# Patient Record
Sex: Female | Born: 1945 | Race: Black or African American | Hispanic: No | Smoking: Never smoker
Health system: Southern US, Community
[De-identification: ages and names within clinical notes are randomized; demographics above are authoritative.]

## PROBLEM LIST (undated history)

## (undated) ENCOUNTER — Emergency Department (HOSPITAL_COMMUNITY): Admission: EM | Payer: PRIVATE HEALTH INSURANCE | Source: Home / Self Care

## (undated) DIAGNOSIS — J3489 Other specified disorders of nose and nasal sinuses: Secondary | ICD-10-CM

## (undated) DIAGNOSIS — I10 Essential (primary) hypertension: Secondary | ICD-10-CM

## (undated) DIAGNOSIS — R7301 Impaired fasting glucose: Secondary | ICD-10-CM

## (undated) DIAGNOSIS — R351 Nocturia: Secondary | ICD-10-CM

## (undated) DIAGNOSIS — Z853 Personal history of malignant neoplasm of breast: Secondary | ICD-10-CM

## (undated) DIAGNOSIS — R011 Cardiac murmur, unspecified: Secondary | ICD-10-CM

## (undated) DIAGNOSIS — Z8669 Personal history of other diseases of the nervous system and sense organs: Secondary | ICD-10-CM

## (undated) DIAGNOSIS — J309 Allergic rhinitis, unspecified: Secondary | ICD-10-CM

## (undated) DIAGNOSIS — K219 Gastro-esophageal reflux disease without esophagitis: Secondary | ICD-10-CM

## (undated) DIAGNOSIS — I7121 Aneurysm of the ascending aorta, without rupture: Secondary | ICD-10-CM

## (undated) DIAGNOSIS — Z800 Family history of malignant neoplasm of digestive organs: Secondary | ICD-10-CM

## (undated) DIAGNOSIS — Z803 Family history of malignant neoplasm of breast: Secondary | ICD-10-CM

## (undated) DIAGNOSIS — E785 Hyperlipidemia, unspecified: Secondary | ICD-10-CM

## (undated) DIAGNOSIS — Z8042 Family history of malignant neoplasm of prostate: Secondary | ICD-10-CM

## (undated) DIAGNOSIS — Z8673 Personal history of transient ischemic attack (TIA), and cerebral infarction without residual deficits: Secondary | ICD-10-CM

## (undated) DIAGNOSIS — N8110 Cystocele, unspecified: Secondary | ICD-10-CM

## (undated) DIAGNOSIS — Z801 Family history of malignant neoplasm of trachea, bronchus and lung: Secondary | ICD-10-CM

## (undated) DIAGNOSIS — M5030 Other cervical disc degeneration, unspecified cervical region: Secondary | ICD-10-CM

## (undated) DIAGNOSIS — N3946 Mixed incontinence: Secondary | ICD-10-CM

## (undated) DIAGNOSIS — C801 Malignant (primary) neoplasm, unspecified: Secondary | ICD-10-CM

## (undated) DIAGNOSIS — M1990 Unspecified osteoarthritis, unspecified site: Secondary | ICD-10-CM

## (undated) DIAGNOSIS — I712 Thoracic aortic aneurysm, without rupture: Secondary | ICD-10-CM

## (undated) HISTORY — DX: Hyperlipidemia, unspecified: E78.5

## (undated) HISTORY — PX: JOINT REPLACEMENT: SHX530

## (undated) HISTORY — DX: Essential (primary) hypertension: I10

## (undated) HISTORY — DX: Family history of malignant neoplasm of trachea, bronchus and lung: Z80.1

## (undated) HISTORY — PX: OTHER SURGICAL HISTORY: SHX169

## (undated) HISTORY — PX: TRANSTHORACIC ECHOCARDIOGRAM: SHX275

## (undated) HISTORY — PX: CERVICAL DISC SURGERY: SHX588

## (undated) HISTORY — PX: CARPAL TUNNEL RELEASE: SHX101

## (undated) HISTORY — DX: Family history of malignant neoplasm of prostate: Z80.42

## (undated) HISTORY — PX: CARDIAC CATHETERIZATION: SHX172

## (undated) HISTORY — PX: ABDOMINAL HYSTERECTOMY: SHX81

## (undated) HISTORY — DX: Family history of malignant neoplasm of digestive organs: Z80.0

## (undated) HISTORY — DX: Family history of malignant neoplasm of breast: Z80.3

---

## 1979-04-26 HISTORY — PX: OTHER SURGICAL HISTORY: SHX169

## 1988-08-25 HISTORY — PX: TOTAL ABDOMINAL HYSTERECTOMY W/ BILATERAL SALPINGOOPHORECTOMY: SHX83

## 2001-04-19 ENCOUNTER — Encounter: Payer: Self-pay | Admitting: Internal Medicine

## 2001-04-19 ENCOUNTER — Ambulatory Visit (HOSPITAL_COMMUNITY): Admission: RE | Admit: 2001-04-19 | Discharge: 2001-04-19 | Payer: Self-pay | Admitting: Internal Medicine

## 2002-03-28 ENCOUNTER — Encounter: Admission: RE | Admit: 2002-03-28 | Discharge: 2002-03-28 | Payer: Self-pay | Admitting: Internal Medicine

## 2002-03-28 ENCOUNTER — Encounter: Payer: Self-pay | Admitting: Internal Medicine

## 2002-08-01 ENCOUNTER — Encounter: Payer: Self-pay | Admitting: Internal Medicine

## 2002-08-01 ENCOUNTER — Ambulatory Visit (HOSPITAL_COMMUNITY): Admission: RE | Admit: 2002-08-01 | Discharge: 2002-08-01 | Payer: Self-pay | Admitting: Internal Medicine

## 2002-08-10 ENCOUNTER — Ambulatory Visit (HOSPITAL_COMMUNITY): Admission: RE | Admit: 2002-08-10 | Discharge: 2002-08-10 | Payer: Self-pay | Admitting: Internal Medicine

## 2002-08-10 ENCOUNTER — Encounter: Payer: Self-pay | Admitting: Internal Medicine

## 2002-08-16 ENCOUNTER — Encounter: Payer: Self-pay | Admitting: Internal Medicine

## 2002-08-16 ENCOUNTER — Ambulatory Visit (HOSPITAL_COMMUNITY): Admission: RE | Admit: 2002-08-16 | Discharge: 2002-08-16 | Payer: Self-pay | Admitting: Internal Medicine

## 2003-08-07 ENCOUNTER — Ambulatory Visit (HOSPITAL_COMMUNITY): Admission: RE | Admit: 2003-08-07 | Discharge: 2003-08-07 | Payer: Self-pay | Admitting: Internal Medicine

## 2004-08-25 DIAGNOSIS — C50919 Malignant neoplasm of unspecified site of unspecified female breast: Secondary | ICD-10-CM

## 2004-08-25 HISTORY — DX: Malignant neoplasm of unspecified site of unspecified female breast: C50.919

## 2004-09-17 ENCOUNTER — Ambulatory Visit (HOSPITAL_COMMUNITY): Admission: RE | Admit: 2004-09-17 | Discharge: 2004-09-17 | Payer: Self-pay | Admitting: Internal Medicine

## 2004-09-24 ENCOUNTER — Encounter (INDEPENDENT_AMBULATORY_CARE_PROVIDER_SITE_OTHER): Payer: Self-pay | Admitting: Diagnostic Radiology

## 2004-09-24 ENCOUNTER — Encounter: Admission: RE | Admit: 2004-09-24 | Discharge: 2004-09-24 | Payer: Self-pay | Admitting: Internal Medicine

## 2004-09-24 ENCOUNTER — Encounter (INDEPENDENT_AMBULATORY_CARE_PROVIDER_SITE_OTHER): Payer: Self-pay | Admitting: *Deleted

## 2004-11-08 ENCOUNTER — Encounter: Admission: RE | Admit: 2004-11-08 | Discharge: 2004-11-08 | Payer: Self-pay | Admitting: General Surgery

## 2004-11-11 ENCOUNTER — Ambulatory Visit (HOSPITAL_COMMUNITY): Admission: RE | Admit: 2004-11-11 | Discharge: 2004-11-11 | Payer: Self-pay | Admitting: General Surgery

## 2004-11-11 ENCOUNTER — Ambulatory Visit (HOSPITAL_BASED_OUTPATIENT_CLINIC_OR_DEPARTMENT_OTHER): Admission: RE | Admit: 2004-11-11 | Discharge: 2004-11-11 | Payer: Self-pay | Admitting: General Surgery

## 2004-11-11 ENCOUNTER — Encounter (INDEPENDENT_AMBULATORY_CARE_PROVIDER_SITE_OTHER): Payer: Self-pay | Admitting: Specialist

## 2004-11-11 ENCOUNTER — Encounter: Admission: RE | Admit: 2004-11-11 | Discharge: 2004-11-11 | Payer: Self-pay | Admitting: General Surgery

## 2004-11-18 ENCOUNTER — Ambulatory Visit: Admission: RE | Admit: 2004-11-18 | Discharge: 2005-02-16 | Payer: Self-pay | Admitting: Radiation Oncology

## 2005-01-17 ENCOUNTER — Ambulatory Visit (HOSPITAL_COMMUNITY): Admission: RE | Admit: 2005-01-17 | Discharge: 2005-01-17 | Payer: Self-pay | Admitting: Internal Medicine

## 2005-05-15 ENCOUNTER — Ambulatory Visit (HOSPITAL_COMMUNITY): Admission: RE | Admit: 2005-05-15 | Discharge: 2005-05-15 | Payer: Self-pay | Admitting: Internal Medicine

## 2005-08-25 HISTORY — PX: NECK SURGERY: SHX720

## 2005-10-01 ENCOUNTER — Ambulatory Visit (HOSPITAL_COMMUNITY): Admission: RE | Admit: 2005-10-01 | Discharge: 2005-10-01 | Payer: Self-pay | Admitting: General Surgery

## 2005-11-12 ENCOUNTER — Ambulatory Visit (HOSPITAL_COMMUNITY): Admission: RE | Admit: 2005-11-12 | Discharge: 2005-11-12 | Payer: Self-pay | Admitting: Internal Medicine

## 2005-12-12 ENCOUNTER — Ambulatory Visit (HOSPITAL_COMMUNITY): Admission: RE | Admit: 2005-12-12 | Discharge: 2005-12-13 | Payer: Self-pay | Admitting: Neurosurgery

## 2006-08-31 ENCOUNTER — Ambulatory Visit (HOSPITAL_COMMUNITY): Admission: RE | Admit: 2006-08-31 | Discharge: 2006-08-31 | Payer: Self-pay | Admitting: Family Medicine

## 2006-08-31 ENCOUNTER — Ambulatory Visit: Payer: Self-pay | Admitting: Family Medicine

## 2006-09-14 ENCOUNTER — Ambulatory Visit: Payer: Self-pay | Admitting: Family Medicine

## 2006-10-07 ENCOUNTER — Ambulatory Visit (HOSPITAL_COMMUNITY): Admission: RE | Admit: 2006-10-07 | Discharge: 2006-10-07 | Payer: Self-pay | Admitting: Family Medicine

## 2006-11-09 ENCOUNTER — Ambulatory Visit: Payer: Self-pay | Admitting: Family Medicine

## 2006-11-09 ENCOUNTER — Other Ambulatory Visit: Admission: RE | Admit: 2006-11-09 | Discharge: 2006-11-09 | Payer: Self-pay | Admitting: Family Medicine

## 2006-11-09 ENCOUNTER — Encounter: Payer: Self-pay | Admitting: Family Medicine

## 2006-11-09 LAB — CONVERTED CEMR LAB: Pap Smear: NORMAL

## 2006-11-11 ENCOUNTER — Ambulatory Visit (HOSPITAL_COMMUNITY): Admission: RE | Admit: 2006-11-11 | Discharge: 2006-11-11 | Payer: Self-pay | Admitting: Family Medicine

## 2007-06-02 ENCOUNTER — Ambulatory Visit: Payer: Self-pay | Admitting: Family Medicine

## 2007-07-28 ENCOUNTER — Ambulatory Visit: Payer: Self-pay | Admitting: Family Medicine

## 2007-07-31 ENCOUNTER — Encounter: Payer: Self-pay | Admitting: Family Medicine

## 2007-07-31 LAB — CONVERTED CEMR LAB
Albumin: 4.3 g/dL (ref 3.5–5.2)
Alkaline Phosphatase: 91 units/L (ref 39–117)
CO2: 26 meq/L (ref 19–32)
Chloride: 104 meq/L (ref 96–112)
Creatinine, Ser: 0.97 mg/dL (ref 0.40–1.20)
HDL: 95 mg/dL (ref >39)
LDL Cholesterol: 95 mg/dL (ref 0–99)
Potassium: 4.2 meq/L (ref 3.5–5.3)
Total Bilirubin: 0.6 mg/dL (ref 0.3–1.2)
Total Protein: 7.5 g/dL (ref 6.0–8.3)
VLDL: 6 mg/dL (ref 0–40)

## 2007-08-26 ENCOUNTER — Encounter: Payer: Self-pay | Admitting: Family Medicine

## 2007-10-20 ENCOUNTER — Ambulatory Visit (HOSPITAL_COMMUNITY): Admission: RE | Admit: 2007-10-20 | Discharge: 2007-10-20 | Payer: Self-pay | Admitting: Family Medicine

## 2007-10-20 ENCOUNTER — Encounter: Payer: Self-pay | Admitting: Family Medicine

## 2007-10-20 LAB — CONVERTED CEMR LAB: BUN: 24 mg/dL — ABNORMAL HIGH (ref 6–23)

## 2007-11-03 ENCOUNTER — Ambulatory Visit (HOSPITAL_COMMUNITY): Admission: RE | Admit: 2007-11-03 | Discharge: 2007-11-03 | Payer: Self-pay | Admitting: Family Medicine

## 2007-11-10 ENCOUNTER — Ambulatory Visit: Payer: Self-pay | Admitting: Family Medicine

## 2008-02-04 ENCOUNTER — Encounter: Payer: Self-pay | Admitting: Family Medicine

## 2008-02-04 DIAGNOSIS — C50919 Malignant neoplasm of unspecified site of unspecified female breast: Secondary | ICD-10-CM | POA: Insufficient documentation

## 2008-02-04 DIAGNOSIS — E663 Overweight: Secondary | ICD-10-CM | POA: Insufficient documentation

## 2008-02-04 DIAGNOSIS — E785 Hyperlipidemia, unspecified: Secondary | ICD-10-CM | POA: Insufficient documentation

## 2008-02-04 DIAGNOSIS — I10 Essential (primary) hypertension: Secondary | ICD-10-CM | POA: Insufficient documentation

## 2008-03-23 ENCOUNTER — Encounter: Payer: Self-pay | Admitting: Family Medicine

## 2008-03-23 ENCOUNTER — Ambulatory Visit: Payer: Self-pay | Admitting: Family Medicine

## 2008-03-24 DIAGNOSIS — R011 Cardiac murmur, unspecified: Secondary | ICD-10-CM | POA: Insufficient documentation

## 2008-03-25 ENCOUNTER — Encounter: Payer: Self-pay | Admitting: Family Medicine

## 2008-03-25 LAB — CONVERTED CEMR LAB
ALT: 13 units/L (ref 0–35)
BUN: 21 mg/dL (ref 6–23)
Bilirubin, Direct: 0.1 mg/dL (ref 0.0–0.3)
CO2: 28 meq/L (ref 19–32)
Chloride: 102 meq/L (ref 96–112)
Cholesterol: 204 mg/dL — ABNORMAL HIGH (ref 0–200)
Glucose, Bld: 94 mg/dL (ref 70–99)
Indirect Bilirubin: 0.5 mg/dL (ref 0.0–0.9)
LDL Cholesterol: 110 mg/dL — ABNORMAL HIGH (ref 0–99)
Lymphocytes Relative: 35 % (ref 12–46)
Lymphs Abs: 1.3 10*3/uL (ref 0.7–4.0)
MCV: 85.1 fL (ref 78.0–100.0)
Monocytes Relative: 7 % (ref 3–12)
Neutro Abs: 1.9 10*3/uL (ref 1.7–7.7)
Neutrophils Relative %: 54 % (ref 43–77)
Platelets: 286 10*3/uL (ref 150–400)
Potassium: 3.8 meq/L (ref 3.5–5.3)
RBC: 4.29 M/uL (ref 3.87–5.11)
Sodium: 141 meq/L (ref 135–145)
Total CHOL/HDL Ratio: 2.4
VLDL: 10 mg/dL (ref 0–40)
WBC: 3.6 10*3/uL — ABNORMAL LOW (ref 4.0–10.5)

## 2008-03-30 ENCOUNTER — Encounter: Payer: Self-pay | Admitting: Family Medicine

## 2008-03-30 ENCOUNTER — Ambulatory Visit (HOSPITAL_COMMUNITY): Admission: RE | Admit: 2008-03-30 | Discharge: 2008-03-30 | Payer: Self-pay | Admitting: Family Medicine

## 2008-03-31 ENCOUNTER — Ambulatory Visit (HOSPITAL_COMMUNITY): Admission: RE | Admit: 2008-03-31 | Discharge: 2008-03-31 | Payer: Self-pay | Admitting: Family Medicine

## 2008-04-07 ENCOUNTER — Encounter: Payer: Self-pay | Admitting: Family Medicine

## 2008-04-19 ENCOUNTER — Encounter: Payer: Self-pay | Admitting: Family Medicine

## 2008-05-17 ENCOUNTER — Encounter: Payer: Self-pay | Admitting: Family Medicine

## 2008-06-14 ENCOUNTER — Encounter: Payer: Self-pay | Admitting: Family Medicine

## 2008-07-25 ENCOUNTER — Ambulatory Visit: Payer: Self-pay | Admitting: Family Medicine

## 2008-07-27 ENCOUNTER — Telehealth (INDEPENDENT_AMBULATORY_CARE_PROVIDER_SITE_OTHER): Payer: Self-pay | Admitting: *Deleted

## 2008-07-27 ENCOUNTER — Encounter: Payer: Self-pay | Admitting: Family Medicine

## 2008-07-30 DIAGNOSIS — J019 Acute sinusitis, unspecified: Secondary | ICD-10-CM | POA: Insufficient documentation

## 2008-07-30 DIAGNOSIS — R109 Unspecified abdominal pain: Secondary | ICD-10-CM | POA: Insufficient documentation

## 2008-08-08 ENCOUNTER — Ambulatory Visit (HOSPITAL_COMMUNITY): Admission: RE | Admit: 2008-08-08 | Discharge: 2008-08-08 | Payer: Self-pay | Admitting: Family Medicine

## 2008-08-22 ENCOUNTER — Ambulatory Visit: Payer: Self-pay | Admitting: Gastroenterology

## 2008-08-22 LAB — CONVERTED CEMR LAB
Albumin: 4.2 g/dL (ref 3.5–5.2)
BUN: 17 mg/dL (ref 6–23)
Basophils Relative: 0 % (ref 0–1)
Calcium: 9.3 mg/dL (ref 8.4–10.5)
Chloride: 102 meq/L (ref 96–112)
Creatinine, Ser: 0.93 mg/dL (ref 0.40–1.20)
Eosinophils Absolute: 0.1 10*3/uL (ref 0.0–0.7)
Glucose, Bld: 86 mg/dL (ref 70–99)
Hemoglobin: 12 g/dL (ref 12.0–15.0)
Lipase: 42 units/L (ref 0–75)
Lymphs Abs: 1.6 10*3/uL (ref 0.7–4.0)
MCHC: 32.3 g/dL (ref 30.0–36.0)
MCV: 84.9 fL (ref 78.0–100.0)
Monocytes Absolute: 0.4 10*3/uL (ref 0.1–1.0)
Monocytes Relative: 7 % (ref 3–12)
Neutro Abs: 3.1 10*3/uL (ref 1.7–7.7)
Neutrophils Relative %: 60 % (ref 43–77)
Potassium: 3.9 meq/L (ref 3.5–5.3)
RBC: 4.38 M/uL (ref 3.87–5.11)
WBC: 5.1 10*3/uL (ref 4.0–10.5)

## 2008-09-07 ENCOUNTER — Encounter: Payer: Self-pay | Admitting: Gastroenterology

## 2008-09-07 ENCOUNTER — Ambulatory Visit: Payer: Self-pay | Admitting: Gastroenterology

## 2008-09-07 ENCOUNTER — Ambulatory Visit (HOSPITAL_COMMUNITY): Admission: RE | Admit: 2008-09-07 | Discharge: 2008-09-07 | Payer: Self-pay | Admitting: Gastroenterology

## 2008-09-07 LAB — HM COLONOSCOPY

## 2008-09-14 ENCOUNTER — Encounter: Payer: Self-pay | Admitting: Family Medicine

## 2008-09-14 LAB — CONVERTED CEMR LAB
ALT: 12 U/L
AST: 21 U/L
Albumin: 4.1 g/dL
Alkaline Phosphatase: 90 U/L
BUN: 16 mg/dL
Bilirubin, Direct: 0.1 mg/dL
CO2: 28 meq/L
Calcium: 8.9 mg/dL
Chloride: 104 meq/L
Cholesterol: 170 mg/dL
Creatinine, Ser: 0.99 mg/dL
Glucose, Bld: 91 mg/dL
HDL: 94 mg/dL
Indirect Bilirubin: 0.5 mg/dL
LDL Cholesterol: 67 mg/dL
Potassium: 4 meq/L
Sodium: 145 meq/L
Total Bilirubin: 0.6 mg/dL
Total CHOL/HDL Ratio: 1.8
Total Protein: 7.3 g/dL
Triglycerides: 43 mg/dL
VLDL: 9 mg/dL

## 2008-09-20 ENCOUNTER — Encounter: Payer: Self-pay | Admitting: Family Medicine

## 2008-10-05 ENCOUNTER — Ambulatory Visit: Payer: Self-pay | Admitting: Gastroenterology

## 2008-10-26 ENCOUNTER — Ambulatory Visit: Payer: Self-pay | Admitting: Family Medicine

## 2008-11-02 ENCOUNTER — Ambulatory Visit (HOSPITAL_COMMUNITY): Admission: RE | Admit: 2008-11-02 | Discharge: 2008-11-02 | Payer: Self-pay | Admitting: Family Medicine

## 2008-11-11 ENCOUNTER — Encounter: Payer: Self-pay | Admitting: Family Medicine

## 2008-11-30 ENCOUNTER — Encounter: Payer: Self-pay | Admitting: Family Medicine

## 2009-01-16 ENCOUNTER — Ambulatory Visit: Payer: Self-pay | Admitting: Family Medicine

## 2009-01-16 DIAGNOSIS — R5381 Other malaise: Secondary | ICD-10-CM | POA: Insufficient documentation

## 2009-01-16 DIAGNOSIS — R079 Chest pain, unspecified: Secondary | ICD-10-CM | POA: Insufficient documentation

## 2009-01-16 DIAGNOSIS — R5383 Other fatigue: Secondary | ICD-10-CM

## 2009-01-16 DIAGNOSIS — R0989 Other specified symptoms and signs involving the circulatory and respiratory systems: Secondary | ICD-10-CM | POA: Insufficient documentation

## 2009-01-17 ENCOUNTER — Ambulatory Visit (HOSPITAL_COMMUNITY): Admission: RE | Admit: 2009-01-17 | Discharge: 2009-01-17 | Payer: Self-pay | Admitting: Family Medicine

## 2009-01-18 ENCOUNTER — Encounter: Payer: Self-pay | Admitting: Family Medicine

## 2009-01-23 ENCOUNTER — Ambulatory Visit (HOSPITAL_COMMUNITY): Admission: RE | Admit: 2009-01-23 | Discharge: 2009-01-23 | Payer: Self-pay | Admitting: Cardiology

## 2009-02-01 ENCOUNTER — Ambulatory Visit (HOSPITAL_COMMUNITY): Admission: RE | Admit: 2009-02-01 | Discharge: 2009-02-01 | Payer: Self-pay | Admitting: Cardiology

## 2009-03-01 ENCOUNTER — Encounter: Payer: Self-pay | Admitting: Family Medicine

## 2009-03-01 ENCOUNTER — Ambulatory Visit: Payer: Self-pay | Admitting: Family Medicine

## 2009-03-01 ENCOUNTER — Other Ambulatory Visit: Admission: RE | Admit: 2009-03-01 | Discharge: 2009-03-01 | Payer: Self-pay | Admitting: Family Medicine

## 2009-03-01 LAB — CONVERTED CEMR LAB: OCCULT 1: NEGATIVE

## 2009-03-15 ENCOUNTER — Telehealth: Payer: Self-pay | Admitting: Family Medicine

## 2009-06-04 ENCOUNTER — Encounter: Payer: Self-pay | Admitting: Family Medicine

## 2009-07-27 ENCOUNTER — Ambulatory Visit: Payer: Self-pay | Admitting: Family Medicine

## 2009-07-28 LAB — CONVERTED CEMR LAB
AST: 21 units/L (ref 0–37)
Albumin: 4.3 g/dL (ref 3.5–5.2)
Alkaline Phosphatase: 70 units/L (ref 39–117)
Calcium: 9.2 mg/dL (ref 8.4–10.5)
Chloride: 102 meq/L (ref 96–112)
Creatinine, Ser: 1.01 mg/dL (ref 0.40–1.20)
Eosinophils Absolute: 0.1 10*3/uL (ref 0.0–0.7)
HDL: 80 mg/dL (ref >39)
LDL Cholesterol: 92 mg/dL (ref 0–99)
Lymphs Abs: 1 10*3/uL (ref 0.7–4.0)
MCV: 86.6 fL (ref 78.0–100.0)
Neutro Abs: 2.5 10*3/uL (ref 1.7–7.7)
Neutrophils Relative %: 64 % (ref 43–77)
Platelets: 309 10*3/uL (ref 150–400)
Sodium: 142 meq/L (ref 135–145)
Total Protein: 7.1 g/dL (ref 6.0–8.3)
Triglycerides: 42 mg/dL (ref <150)
WBC: 3.8 10*3/uL — ABNORMAL LOW (ref 4.0–10.5)

## 2009-08-27 ENCOUNTER — Ambulatory Visit (HOSPITAL_COMMUNITY): Admission: RE | Admit: 2009-08-27 | Discharge: 2009-08-27 | Payer: Self-pay | Admitting: Cardiology

## 2009-09-07 ENCOUNTER — Encounter: Payer: Self-pay | Admitting: Family Medicine

## 2009-10-10 ENCOUNTER — Telehealth: Payer: Self-pay | Admitting: Family Medicine

## 2009-11-07 ENCOUNTER — Ambulatory Visit (HOSPITAL_COMMUNITY): Admission: RE | Admit: 2009-11-07 | Discharge: 2009-11-07 | Payer: Self-pay | Admitting: Family Medicine

## 2009-11-13 ENCOUNTER — Telehealth: Payer: Self-pay | Admitting: Physician Assistant

## 2009-11-26 ENCOUNTER — Ambulatory Visit: Payer: Self-pay | Admitting: Family Medicine

## 2009-11-26 DIAGNOSIS — R29810 Facial weakness: Secondary | ICD-10-CM | POA: Insufficient documentation

## 2009-11-27 ENCOUNTER — Encounter: Payer: Self-pay | Admitting: Family Medicine

## 2009-11-27 ENCOUNTER — Telehealth: Payer: Self-pay | Admitting: Family Medicine

## 2009-11-30 ENCOUNTER — Ambulatory Visit (HOSPITAL_COMMUNITY): Admission: RE | Admit: 2009-11-30 | Discharge: 2009-11-30 | Payer: Self-pay | Admitting: Neurology

## 2009-12-06 ENCOUNTER — Encounter: Payer: Self-pay | Admitting: Family Medicine

## 2010-01-09 ENCOUNTER — Ambulatory Visit: Payer: Self-pay | Admitting: Family Medicine

## 2010-01-09 DIAGNOSIS — M25559 Pain in unspecified hip: Secondary | ICD-10-CM | POA: Insufficient documentation

## 2010-01-09 DIAGNOSIS — R7301 Impaired fasting glucose: Secondary | ICD-10-CM | POA: Insufficient documentation

## 2010-01-16 LAB — CONVERTED CEMR LAB
ALT: 13 units/L (ref 0–35)
AST: 21 units/L (ref 0–37)
Albumin: 4.2 g/dL (ref 3.5–5.2)
Alkaline Phosphatase: 79 units/L (ref 39–117)
BUN: 23 mg/dL (ref 6–23)
Bilirubin, Direct: 0.2 mg/dL (ref 0.0–0.3)
CO2: 28 meq/L (ref 19–32)
Calcium: 10.1 mg/dL (ref 8.4–10.5)
Chloride: 99 meq/L (ref 96–112)
Cholesterol: 170 mg/dL (ref 0–200)
Creatinine, Ser: 1.02 mg/dL (ref 0.40–1.20)
Glucose, Bld: 95 mg/dL (ref 70–99)
HDL: 86 mg/dL (ref >39)
Hgb A1c MFr Bld: 5.8 % — ABNORMAL HIGH (ref <5.7)
Indirect Bilirubin: 0.4 mg/dL (ref 0.0–0.9)
LDL Cholesterol: 76 mg/dL (ref 0–99)
Potassium: 3.5 meq/L (ref 3.5–5.3)
Sodium: 140 meq/L (ref 135–145)
Total Bilirubin: 0.6 mg/dL (ref 0.3–1.2)
Total CHOL/HDL Ratio: 2
Total Protein: 7.6 g/dL (ref 6.0–8.3)
Triglycerides: 39 mg/dL (ref <150)
VLDL: 8 mg/dL (ref 0–40)
Vit D, 25-Hydroxy: 41 ng/mL (ref 30–89)

## 2010-01-28 ENCOUNTER — Telehealth: Payer: Self-pay | Admitting: Family Medicine

## 2010-03-05 ENCOUNTER — Ambulatory Visit: Payer: Self-pay | Admitting: Family Medicine

## 2010-03-05 DIAGNOSIS — R42 Dizziness and giddiness: Secondary | ICD-10-CM | POA: Insufficient documentation

## 2010-03-06 LAB — CONVERTED CEMR LAB
Calcium: 8.9 mg/dL (ref 8.4–10.5)
Creatinine, Ser: 1.09 mg/dL (ref 0.40–1.20)
Eosinophils Absolute: 0.1 10*3/uL (ref 0.0–0.7)
Eosinophils Relative: 1 % (ref 0–5)
Glucose, Bld: 74 mg/dL (ref 70–99)
HCT: 36 % (ref 36.0–46.0)
Lymphs Abs: 1.4 10*3/uL (ref 0.7–4.0)
MCV: 87.6 fL (ref 78.0–100.0)
Platelets: 293 10*3/uL (ref 150–400)
RDW: 15.7 % — ABNORMAL HIGH (ref 11.5–15.5)
Sodium: 139 meq/L (ref 135–145)
WBC: 4.6 10*3/uL (ref 4.0–10.5)

## 2010-03-20 LAB — CONVERTED CEMR LAB
CO2: 25 meq/L (ref 19–32)
Calcium: 8.9 mg/dL (ref 8.4–10.5)
Sodium: 140 meq/L (ref 135–145)

## 2010-06-13 ENCOUNTER — Ambulatory Visit: Payer: Self-pay | Admitting: Family Medicine

## 2010-06-13 ENCOUNTER — Other Ambulatory Visit: Admission: RE | Admit: 2010-06-13 | Discharge: 2010-06-13 | Payer: Self-pay | Admitting: Family Medicine

## 2010-06-13 LAB — CONVERTED CEMR LAB: OCCULT 1: NEGATIVE

## 2010-06-19 ENCOUNTER — Encounter: Payer: Self-pay | Admitting: Physician Assistant

## 2010-06-19 LAB — CONVERTED CEMR LAB: Pap Smear: NEGATIVE

## 2010-09-02 ENCOUNTER — Ambulatory Visit (HOSPITAL_COMMUNITY)
Admission: RE | Admit: 2010-09-02 | Discharge: 2010-09-02 | Payer: Self-pay | Source: Home / Self Care | Attending: Cardiovascular Disease | Admitting: Cardiovascular Disease

## 2010-09-09 ENCOUNTER — Telehealth: Payer: Self-pay | Admitting: Family Medicine

## 2010-09-09 ENCOUNTER — Ambulatory Visit
Admission: RE | Admit: 2010-09-09 | Discharge: 2010-09-09 | Payer: Self-pay | Source: Home / Self Care | Attending: Family Medicine | Admitting: Family Medicine

## 2010-09-09 DIAGNOSIS — M549 Dorsalgia, unspecified: Secondary | ICD-10-CM | POA: Insufficient documentation

## 2010-09-12 ENCOUNTER — Encounter: Payer: Self-pay | Admitting: Family Medicine

## 2010-09-14 ENCOUNTER — Encounter: Payer: Self-pay | Admitting: Internal Medicine

## 2010-09-14 LAB — CONVERTED CEMR LAB
ALT: 12 units/L (ref 0–35)
AST: 23 units/L (ref 0–37)
Albumin: 4.2 g/dL (ref 3.5–5.2)
Cholesterol: 165 mg/dL (ref 0–200)
Glucose, Bld: 101 mg/dL — ABNORMAL HIGH (ref 70–99)
HDL: 78 mg/dL (ref >39)
Potassium: 3.7 meq/L (ref 3.5–5.3)
Sodium: 140 meq/L (ref 135–145)
TSH: 3.517 microintl units/mL (ref 0.350–4.500)
Total CHOL/HDL Ratio: 2.1
Total Protein: 7.4 g/dL (ref 6.0–8.3)
Triglycerides: 32 mg/dL (ref <150)
VLDL: 6 mg/dL (ref 0–40)

## 2010-09-15 ENCOUNTER — Encounter: Payer: Self-pay | Admitting: Family Medicine

## 2010-09-15 ENCOUNTER — Encounter: Payer: Self-pay | Admitting: *Deleted

## 2010-09-24 ENCOUNTER — Ambulatory Visit
Admission: RE | Admit: 2010-09-24 | Discharge: 2010-09-24 | Payer: Self-pay | Source: Home / Self Care | Attending: Family Medicine | Admitting: Family Medicine

## 2010-09-24 ENCOUNTER — Other Ambulatory Visit: Payer: Self-pay | Admitting: Family Medicine

## 2010-09-24 DIAGNOSIS — N329 Bladder disorder, unspecified: Secondary | ICD-10-CM | POA: Insufficient documentation

## 2010-09-24 DIAGNOSIS — Z9889 Other specified postprocedural states: Secondary | ICD-10-CM

## 2010-09-26 NOTE — Progress Notes (Signed)
  Phone Note Call from Patient   Caller: Patient Summary of Call: patient states 20mg  of omeprazole isnt lasting through the day can she take two daily? Initial call taken by: Adella Hare LPN,  November 13, 2009 4:25 PM  Follow-up for Phone Call        yes she can take 2 daily.  I have sent a new prescription to pharmacy.  Follow-up by: Esperanza Sheets PA,  November 13, 2009 4:49 PM  Additional Follow-up for Phone Call Additional follow up Details #1::        returned call, no answer Additional Follow-up by: Adella Hare LPN,  November 13, 2009 5:04 PM    Additional Follow-up for Phone Call Additional follow up Details #2::    Pt notified Follow-up by: Esperanza Sheets PA,  November 13, 2009 5:11 PM  New/Updated Medications: OMEPRAZOLE 20 MG  CPDR (OMEPRAZOLE) one tab by mouth bid Prescriptions: OMEPRAZOLE 20 MG  CPDR (OMEPRAZOLE) one tab by mouth bid  #180 x 1   Entered and Authorized by:   Esperanza Sheets PA   Signed by:   Esperanza Sheets PA on 11/13/2009   Method used:   Electronically to        [facility] [phone]* (retail)       [address]       [place], [place]  [address]       Ph: [phone] or [phone]       Fax: [phone]   RxID:   [id number]

## 2010-09-26 NOTE — Letter (Signed)
Summary: labs  labs   Imported By: Curtis Sites 01/30/2010 11:17:50  _____________________________________________________________________  External Attachment:    Type:   Image     Comment:   External Document

## 2010-09-26 NOTE — Progress Notes (Signed)
Summary: [facility]   [facility]   Imported By: Lind Guest 11/30/2009 16:21:45  _____________________________________________________________________  External Attachment:    Type:   Image     Comment:   External Document

## 2010-09-26 NOTE — Assessment & Plan Note (Signed)
Summary: FOLLOW UP   Vital Signs:  Patient profile:   65 year old female Menstrual status:  hysterectomy Height:      61 inches Weight:      197 pounds BMI:     37.36 O2 Sat:      98 % Pulse rate:   88 / minute Pulse rhythm:   regular Resp:     16 per minute BP sitting:   120 / 74  (left arm) Cuff size:   large  Vitals Entered By: Everitt Amber LPN (Jan 09, 2010 8:00 AM)  Nutrition Counseling: Patient's BMI is greater than 25 and therefore counseled on weight management options. CC: Follow up chronic problems   Primary Care Provider:  Syliva Overman MD  CC:  Follow up chronic problems.  History of Present Illness: Reports  that she has been doing well, she recovered completely from Bell's palsety , but does have new dx of remote CVA's She has no residual weakness or numbness from these. Denies recent fever or chills. Denies sinus pressure, nasal congestion , ear pain or sore throat. Denies chest congestion, or cough productive of sputum. Denies chest pain, palpitations, PND, orthopnea or leg swelling. Denies abdominal pain, nausea, vomitting, diarrhea or constipation. Denies change in bowel movements or bloody stool. Denies dysuria , frequency, incontinence or hesitancy.She recenrtly had abbreviated anti-inflammatory  meds for back pain which is not significantly better  Denies headaches, vertigo, seizures. Denies depression, anxiety or insomnia. Denies  rash, lesions, or itch.     Current Medications (verified): 1)  Omeprazole 20 Mg  Cpdr (Omeprazole) .... One Tab By Mouth Bid 2)  Ibuprofen 800 Mg  Tabs (Ibuprofen) .... One Tab By Mouth Three Times A Day As Needed 3)  Simvastatin 40 Mg Tabs (Simvastatin) .... Take 1 Tab By Mouth At Bedtime 4)  Oscal 500/200 D-3 500-200 Mg-Unit  Tabs (Calcium-Vitamin D) .... One Tab By Mouth Three Times A Day 5)  Multivitamins   Tabs (Multiple Vitamin) .... One Tab By Mouth Once Daily 6)  Anacin 81 Mg  Tbec (Aspirin) .... One Tab Po  Qd 7)  Amlodipine Besylate 10 Mg  Tabs (Amlodipine Besylate) .... One Tab By Mouth At Bedtime 8)  Potassium 99 Mg  Tabs (Potassium) .... One Tab By Mouth Once Daily 9)  Hydrochlorothiazide 25 Mg  Tabs (Hydrochlorothiazide) .... One Tab By Mouth Once Daily 10)  Triamcinolone Acetonide 0.5 % Crea (Triamcinolone Acetonide) .... Apply Sparingly To Affected Areas Twice Daily As Needed 11)  Carvedilol 3.125 Mg Tabs (Carvedilol) .... Take 1 Tablet By Mouth Two Times A Day 12)  Claritin 10 Mg Tabs (Loratadine) .... Take 1 Tablet By Mouth Once A Day  Allergies (verified): 1)  ! Codeine  Past History:  Past medical, surgical, family and social histories (including risk factors) reviewed, and no changes noted (except as noted below).  Past Medical History: Current Problems:  OBESITY, MILD (ICD-278.02) NEOPLASM, MALIGNANT, BREAST, RIGHT (ICD-174.9) HYPERLIPIDEMIA (ICD-272.4) HYPERTENSION (ICD-401.9) Aortic aneurysm  2009 Bell's palsy   April 2011 Remote CVA  Past Surgical History: Reviewed history from 02/04/2008 and no changes required. Right partial mastectomy (2006) TAH and BSO (1990) Neck surgery (2007)  Family History: Reviewed history from 02/04/2008 and no changes required. Mother deceased - heart attack, HTN, renal failure Father deceased - throat cancer, prostate cancer One sister living - breast cancer One brother living - arthritis  Social History: Reviewed history from 02/04/2008 and no changes required. Employed Divorced  4 children Never Smoked  Alcohol use-no Drug use-no  Review of Systems      See HPI Eyes:  Denies blurring, discharge, double vision, eye pain, and red eye. MS:  Complains of joint pain, low back pain, and stiffness; increased low back andright hip pain worse since stopping the ibuprofenhip feels as though it will "give out" at times. . Endo:  Denies cold intolerance, excessive hunger, excessive thirst, excessive urination, heat intolerance,  polyuria, and weight change. Heme:  Denies abnormal bruising and bleeding. Allergy:  Complains of seasonal allergies; denies hives or rash and itching eyes.  Physical Exam  General:  Well-developed,well-nourished,in no acute distress; alert,appropriate and cooperative throughout examination HEENT: no facial asymmetry,  EOMI, No sinus tenderness, TM's Clear, oropharynx  pink and moist.   Chest: Clear to auscultation bilaterally.  CVS: S1, S2, No murmurs, No S3.   Abd: Soft, Nontender.  MS: decresed ROM spine, hips, shoulders and knees.  Ext: No edema.   CNS: CN 2-12 intact, power tone and sensation normal throughout.   Skin: Intact, no visible lesions or rashes.  Psych: Good eye contact, normal affect.  Memory intact, not anxious or depressed appearing.    Impression & Recommendations:  Problem # 1:  HIP PAIN, RIGHT (ICD-719.45) Assessment Deteriorated  The following medications were removed from the medication list:    Ibuprofen 800 Mg Tabs (Ibuprofen) ..... One tab by mouth three times a day as needed    Anacin 81 Mg Tbec (Aspirin) ..... One tab po qd    Flexeril 10 Mg Tabs (Cyclobenzaprine hcl) ..... One tab at bedtime as needed Her updated medication list for this problem includes:    Aspirin 325 Mg Tabs (Aspirin) .Marland Kitchen... Take 1 tablet by mouth once a day    Meloxicam 7.5 Mg Tabs (Meloxicam) .Marland Kitchen... Take 1 tablet by mouth once a day as needed start  the week of the 23rd may, no more than 4 days per week  Orders: Depo- Medrol 80mg  (J1040) Ketorolac-Toradol 15mg  (Z6109) Admin of Therapeutic Inj  intramuscular or subcutaneous ([address])  Problem # 2:  FACIAL WEAKNESS (ICD-781.94) Assessment: Improved totally resolved Bells  Problem # 3:  OBESITY, MILD (ICD-278.02) Assessment: Unchanged pt distressed about failure to lose pounds despite diligence as far as exercise is concerned, approx 7 mins was spent focussing on her diet, sheis to start a diary  Problem # 4:  HYPERTENSION  (ICD-401.9) Assessment: Unchanged  Her updated medication list for this problem includes:    Amlodipine Besylate 10 Mg Tabs (Amlodipine besylate) ..... One tab by mouth at bedtime    Hydrochlorothiazide 25 Mg Tabs (Hydrochlorothiazide) ..... One tab by mouth once daily    Carvedilol 3.125 Mg Tabs (Carvedilol) .Marland Kitchen... Take 1 tablet by mouth two times a day  Orders: T-Basic Metabolic Panel [phone])  BP today: 120/74 Prior BP: 122/80 (11/26/2009)  Labs Reviewed: K+: 4.0 (07/28/2009) Creat: : 1.01 (07/28/2009)   Chol: 180 (07/28/2009)   HDL: 80 (07/28/2009)   LDL: 92 (07/28/2009)   TG: 42 (07/28/2009)  Problem # 5:  HYPERLIPIDEMIA (ICD-272.4) Assessment: Comment Only  Her updated medication list for this problem includes:    Simvastatin 40 Mg Tabs (Simvastatin) .Marland Kitchen... Take 1 tab by mouth at bedtime  Orders: T-Hepatic Function [id number]) T-Lipid Profile [id number])  Labs Reviewed: SGOT: 21 (07/28/2009)   SGPT: 14 (07/28/2009)   HDL:80 (07/28/2009), 94 (09/14/2008)  LDL:92 (07/28/2009), 67 (09/14/2008)  Chol:180 (07/28/2009), 170 (09/14/2008)  Trig:42 (07/28/2009), 43 (09/14/2008)  Problem # 6:  NEOPLASM, MALIGNANT, BREAST, RIGHT (ICD-174.9) Assessment:  Comment Only mamo uTD and pt in remission  Complete Medication List: 1)  Omeprazole 20 Mg Cpdr (Omeprazole) .... One tab by mouth bid 2)  Simvastatin 40 Mg Tabs (Simvastatin) .... Take 1 tab by mouth at bedtime 3)  Oscal 500/200 D-3 500-200 Mg-unit Tabs (Calcium-vitamin d) .... One tab by mouth three times a day 4)  Multivitamins Tabs (Multiple vitamin) .... One tab by mouth once daily 5)  Amlodipine Besylate 10 Mg Tabs (Amlodipine besylate) .... One tab by mouth at bedtime 6)  Potassium 99 Mg Tabs (Potassium) .... One tab by mouth once daily 7)  Hydrochlorothiazide 25 Mg Tabs (Hydrochlorothiazide) .... One tab by mouth once daily 8)  Triamcinolone Acetonide 0.5 % Crea (Triamcinolone acetonide) .... Apply sparingly to  affected areas twice daily as needed 9)  Carvedilol 3.125 Mg Tabs (Carvedilol) .... Take 1 tablet by mouth two times a day 10)  Claritin 10 Mg Tabs (Loratadine) .... Take 1 tablet by mouth once a day 11)  Aspirin 325 Mg Tabs (Aspirin) .... Take 1 tablet by mouth once a day 12)  Meloxicam 7.5 Mg Tabs (Meloxicam) .... Take 1 tablet by mouth once a day as needed start  the week of the 23rd may, no more than 4 days per week  Other Orders: T- Hemoglobin A1C ([address]-60454) T-Vitamin D (25-Hydroxy) ([address]-91478)  Patient Instructions: 1)  CPE  4 months. 2)  Injections today for arthritis, call if no better for ortho eval. 3)  HBA1C , fasting lipid, hepatic , chem 7 Vit D level asap. 4)  12 to 1500 cal diet and daily exercise pls Prescriptions: SIMVASTATIN 40 MG TABS (SIMVASTATIN) Take 1 tab by mouth at bedtime  #30 x 3   Entered by:   Everitt Amber LPN   Authorized by:   Syliva Overman MD   Signed by:   Everitt Amber LPN on 29/56/2130   Method used:   Electronically to        [facility] [phone]* (retail)       [address]       [place], [place]  [address]       Ph: [phone] or [phone]       Fax: [phone]   RxID:   [id number] AMLODIPINE BESYLATE 10 MG  TABS (AMLODIPINE BESYLATE) one tab by mouth at bedtime  #90 Each x 3   Entered by:   Everitt Amber LPN   Authorized by:   Syliva Overman MD   Signed by:   Everitt Amber LPN on 84/16/6063   Method used:   Electronically to        [facility] [phone]* (retail)       [address]       [place], [place]  [address]       Ph: [phone] or [phone]       Fax: [phone]   RxID:   [id number] MELOXICAM 7.5 MG TABS (MELOXICAM) Take 1 tablet by mouth once a day as needed start  the week of the 23rd May, no more than 4 days per week  #30 x 2   Entered and Authorized by:   Syliva Overman MD   Signed by:   Syliva Overman MD on 01/09/2010   Method used:   Historical   RxID:    [id number] ASPIRIN 325 MG TABS (ASPIRIN) Take 1 tablet by mouth once a day  #30 x 11  Entered and Authorized by:   Syliva Overman MD   Signed by:   Syliva Overman MD on 01/09/2010   Method used:   Historical   RxID:   [id number]    Medication Administration  Injection # 1:    Medication: Depo- Medrol 80mg     Diagnosis: HIP PAIN, RIGHT (ICD-719.45)    Route: IM    Site: RUOQ gluteus    Exp Date: 07/2010    Lot #: [id number]    Mfr: [employer]    Comments: 80mg  given     Patient tolerated injection without complications    Given by: Everitt Amber LPN (Jan 09, 2010 8:57 AM)  Injection # 2:    Medication: Ketorolac-Toradol 15mg     Diagnosis: HIP PAIN, RIGHT (ICD-719.45)    Route: IM    Site: LUOQ gluteus    Exp Date: 07/2011    Lot #: [id number]     Mfr: [email or web address]    Comments: 60mg  given     Patient tolerated injection without complications    Given by: Everitt Amber LPN (Jan 09, 2010 8:58 AM)  Orders Added: 1)  Est. Patient Level IV [[address]] 2)  T-Hepatic Function [80076-22960] 3)  T-Lipid Profile [80061-22930] 4)  T-Basic Metabolic Panel [80048-22910] 5)  T- Hemoglobin A1C [83036-23375] 6)  T-Vitamin D (25-Hydroxy) [41324-40102] 7)  Depo- Medrol 80mg  [J1040] 8)  Ketorolac-Toradol 15mg  [J1885] 9)  Admin of Therapeutic Inj  intramuscular or subcutaneous [72536]

## 2010-09-26 NOTE — Letter (Signed)
Summary: misc  misc   Imported By: Curtis Sites 01/30/2010 11:21:29  _____________________________________________________________________  External Attachment:    Type:   Image     Comment:   External Document

## 2010-09-26 NOTE — Progress Notes (Signed)
[facility]   Imported By: Lind Guest 09/11/2009 08:28:05  _____________________________________________________________________  External Attachment:    Type:   Image     Comment:   External Document

## 2010-09-26 NOTE — Letter (Signed)
Summary: consult  consult   Imported By: Curtis Sites 01/30/2010 11:16:41  _____________________________________________________________________  External Attachment:    Type:   Image     Comment:   External Document

## 2010-09-26 NOTE — Letter (Signed)
[facility]   Imported By: Lind Guest 09/12/2010 15:01:16  _____________________________________________________________________  External Attachment:    Type:   Image     Comment:   External Document

## 2010-09-26 NOTE — Progress Notes (Signed)
°  Phone Note Call from Patient

## 2010-09-26 NOTE — Letter (Signed)
Summary: history and physical  history and physical   Imported By: Curtis Sites 01/30/2010 11:17:30  _____________________________________________________________________  External Attachment:    Type:   Image     Comment:   External Document

## 2010-09-26 NOTE — Assessment & Plan Note (Signed)
Summary: phy   Vital Signs:  Patient profile:   65 year old female Menstrual status:  hysterectomy Height:      61 inches Weight:      192.25 pounds O2 Sat:      98 % on Room air Pulse rate:   66 / minute Resp:     16 per minute BP sitting:   126 / 80  (left arm)  Vitals Entered By: Mauricia Area CMA (June 13, 2010 2:35 PM)  O2 Flow:  Room air  CC: physical   Primary Care [facility]:  Syliva Overman MD  CC:  physical.  History of Present Illness: Reports  thatshe has been  doing well. Denies recent fever or chills. Denies sinus pressure, nasal congestion , ear pain or sore throat. Denies chest congestion, or cough productive of sputum. Denies chest pain, palpitations, PND, orthopnea or leg swelling. Denies abdominal pain, nausea, vomitting, diarrhea or constipation. Denies change in bowel movements or bloody stool. Denies dysuria , frequency, incontinence or hesitancy. Denies  joint pain, swelling, or reduced mobility. Denies headaches, or, seizures.She has had no more light headedness. Denies depression, anxiety or insomnia. Denies  rash, lesions, or itch.     Current Medications (verified): 1)  Omeprazole 20 Mg  Cpdr (Omeprazole) .... One Tab By Mouth Two Times A Day. 2)  Simvastatin 40 Mg Tabs (Simvastatin) .... Take 1 Tab By Mouth At Bedtime 3)  Oscal 500/200 D-3 500-200 Mg-Unit  Tabs (Calcium-Vitamin D) .... One Tab By Mouth Three Times A Day 4)  Multivitamins   Tabs (Multiple Vitamin) .... One Tab By Mouth Once Daily 5)  Amlodipine Besylate 10 Mg  Tabs (Amlodipine Besylate) .... One Tab By Mouth At Bedtime 6)  Hydrochlorothiazide 25 Mg  Tabs (Hydrochlorothiazide) .... One Tab By Mouth Once Daily 7)  Triamcinolone Acetonide 0.5 % Crea (Triamcinolone Acetonide) .... Apply Sparingly To Affected Areas Twice Daily As Needed 8)  Carvedilol 3.125 Mg Tabs (Carvedilol) .... Take 1 Tablet By Mouth Two Times A Day 9)  Aspirin 325 Mg Tabs (Aspirin) .... Take 1 Tablet By  Mouth Once A Day 10)  Meloxicam 7.5 Mg Tabs (Meloxicam) .... Take 1 Tablet By Mouth Once A Day As Needed Start  The Week of The 23rd May, No More Than 4 Days Per Week 11)  Meclizine Hcl 12.5 Mg Tabs (Meclizine Hcl) .... Take 1 Every 8 Hrs As Needed For Dizziness 12)  Klor-Con M20 20 Meq Cr-Tabs (Potassium Chloride Crys Cr) .... One Tab By Mouth Qd 13)  Cyclobenzaprine Hcl 10 Mg Tabs (Cyclobenzaprine Hcl) .Marland Kitchen.. 1 Tab As Needed 14)  Allergy and Sinus Headache .Marland Kitchen.. 1 Tab Daily  Allergies (verified): 1)  ! Codeine  Review of Systems      See HPI General:  Complains of fatigue. Eyes:  Denies blurring and discharge. Endo:  Denies excessive thirst and excessive urination. Heme:  Denies abnormal bruising and bleeding. Allergy:  Complains of seasonal allergies; denies hives or rash and itching eyes.  Physical Exam  General:  Well-developed,obese,in no acute distress; alert,appropriate and cooperative throughout examination Head:  Normocephalic and atraumatic without obvious abnormalities. No apparent alopecia or balding. Eyes:  No corneal or conjunctival inflammation noted. EOMI. Perrla. Funduscopic exam benign, without hemorrhages, exudates or papilledema. Vision grossly normal. Ears:  External ear exam shows no significant lesions or deformities.  Otoscopic examination reveals clear canals, tympanic membranes are intact bilaterally without bulging, retraction, inflammation or discharge. Hearing is grossly normal bilaterally. Nose:  External nasal examination  shows no deformity or inflammation. Nasal mucosa are pink and moist without lesions or exudates. Mouth:  Oral mucosa and oropharynx without lesions or exudates.  Teeth in good repair. Neck:  No deformities, masses, or tenderness noted. Chest Wall:  No deformities, masses, or tenderness noted. Breasts:  No mass, nodules, thickening, tenderness, bulging, retraction, inflamation, nipple discharge or skin changes noted.   Lungs:  Normal  respiratory effort, chest expands symmetrically. Lungs are clear to auscultation, no crackles or wheezes. Heart:  Normal rate and regular rhythm. S1 and S2 normal without gallop, murmur, click, rub or other extra sounds. Abdomen:  Bowel sounds positive,abdomen soft and non-tender without masses, organomegaly or hernias noted. Rectal:  No external abnormalities noted. Normal sphincter tone. No rectal masses or tenderness.Guaic neg stool Genitalia:  normal introitus, no external lesions, no vaginal discharge, mucosa pink and moist, and no adnexal masses or tenderness. Uterus absent Msk:  No deformity or scoliosis noted of thoracic or lumbar spine.   Pulses:  R and L carotid,radial,femoral,dorsalis pedis and posterior tibial pulses are full and equal bilaterally Extremities:  No clubbing, cyanosis, edema, or deformity noted with normal full range of motion of all joints.   Neurologic:  No cranial nerve deficits noted. Station and gait are normal. Plantar reflexes are down-going bilaterally. DTRs are symmetrical throughout. Sensory, motor and coordinative functions appear intact. Skin:  Intact without suspicious lesions or rashes Cervical Nodes:  No lymphadenopathy noted Axillary Nodes:  No palpable lymphadenopathy Inguinal Nodes:  No significant adenopathy Psych:  Cognition and judgment appear intact. Alert and cooperative with normal attention span and concentration. No apparent delusions, illusions, hallucinations   Impression & Recommendations:  Problem # 1:  DIZZINESS (ICD-780.4) Assessment Improved  The following medications were removed from the medication list:    Claritin 10 Mg Tabs (Loratadine) .Marland Kitchen... Take 1 tablet by mouth once a day Her updated medication list for this problem includes:    Meclizine Hcl 12.5 Mg Tabs (Meclizine hcl) .Marland Kitchen... Take 1 every 8 hrs as needed for dizziness  Problem # 2:  IMPAIRED FASTING GLUCOSE (ICD-790.21) Assessment: Comment Only  Labs Reviewed: Creat:  1.05 (03/20/2010)    Pt advised to reduce carbohydrate intake, espescially sweets, and to start regular physical activity, at least 30 minutes 5 days weekly, to enable weight loss, and reduce the risk of becoming diabetic   Problem # 3:  HYPERTENSION (ICD-401.9) Assessment: Improved  Her updated medication list for this problem includes:    Amlodipine Besylate 10 Mg Tabs (Amlodipine besylate) ..... One tab by mouth at bedtime    Hydrochlorothiazide 25 Mg Tabs (Hydrochlorothiazide) ..... One tab by mouth once daily    Carvedilol 3.125 Mg Tabs (Carvedilol) .Marland Kitchen... Take 1 tablet by mouth two times a day  Orders: T-Basic Metabolic Panel [phone])  BP today: 126/80 Prior BP: 90/60 (03/05/2010)  Labs Reviewed: K+: 3.5 (03/20/2010) Creat: : 1.05 (03/20/2010)   Chol: 170 (01/15/2010)   HDL: 86 (01/15/2010)   LDL: 76 (01/15/2010)   TG: 39 (01/15/2010)  Problem # 4:  HYPERLIPIDEMIA (ICD-272.4) Assessment: Comment Only  The following medications were removed from the medication list:    Simvastatin 40 Mg Tabs (Simvastatin) .Marland Kitchen... Take 1 tab by mouth at bedtime Her updated medication list for this problem includes:    Lovastatin 40 Mg Tabs (Lovastatin) .Marland Kitchen... Take 1 tab by mouth at bedtime Low fat dietdiscussed and encouraged  Orders: T-Lipid Profile [phone]) T-Hepatic Function [phone])  Labs Reviewed: SGOT: 21 (01/15/2010)   SGPT: 13 (01/15/2010)  HDL:86 (01/15/2010), 80 (07/28/2009)  LDL:76 (01/15/2010), 92 (07/28/2009)  Chol:170 (01/15/2010), 180 (07/28/2009)  Trig:39 (01/15/2010), 42 (07/28/2009)  Problem # 5:  OBESITY, MILD (ICD-278.02) Assessment: Deteriorated  Ht: 61 (06/13/2010)   Wt: 192.25 (06/13/2010)   BMI: 36.79 (03/05/2010) therapeutic lifestyle change discussed and encouraged  Complete Medication List: 1)  Omeprazole 20 Mg Cpdr (Omeprazole) .... One tab by mouth two times a day. 2)  Oscal 500/200 D-3 500-200 Mg-unit Tabs (Calcium-vitamin d) .... One tab by  mouth three times a day 3)  Multivitamins Tabs (Multiple vitamin) .... One tab by mouth once daily 4)  Amlodipine Besylate 10 Mg Tabs (Amlodipine besylate) .... One tab by mouth at bedtime 5)  Hydrochlorothiazide 25 Mg Tabs (Hydrochlorothiazide) .... One tab by mouth once daily 6)  Triamcinolone Acetonide 0.5 % Crea (Triamcinolone acetonide) .... Apply sparingly to affected areas twice daily as needed 7)  Carvedilol 3.125 Mg Tabs (Carvedilol) .... Take 1 tablet by mouth two times a day 8)  Aspirin 325 Mg Tabs (Aspirin) .... Take 1 tablet by mouth once a day 9)  Meloxicam 7.5 Mg Tabs (Meloxicam) .... Take 1 tablet by mouth once a day as needed start  the week of the 23rd may, no more than 4 days per week 10)  Meclizine Hcl 12.5 Mg Tabs (Meclizine hcl) .... Take 1 every 8 hrs as needed for dizziness 11)  Klor-con M20 20 Meq Cr-tabs (Potassium chloride crys cr) .... One tab by mouth qd 12)  Cyclobenzaprine Hcl 10 Mg Tabs (Cyclobenzaprine hcl) .Marland Kitchen.. 1 tab as needed 13)  Allergy and Sinus Headache  .Marland Kitchen.. 1 tab daily 14)  Lovastatin 40 Mg Tabs (Lovastatin) .... Take 1 tab by mouth at bedtime  Other Orders: T-TSH ([id number]) Pap Smear (09811) Hemoccult Guaiac-1 spec.(in office) (82270) Influenza Vaccine NON MCR (91478)  Patient Instructions: 1)  f/u in 3.5 to 4 months 2)  It is important that you exercise regularly at least 20 minutes 5 times a week. If you develop chest pain, have severe difficulty breathing, or feel very tired , stop exercising immediately and seek medical attention. 3)  You need to lose weight. Consider a lower calorie diet and regular exercise.  4)  BMP prior to visit, ICD-9: 5)  Hepatic Panel prior to visit, ICD-9:  fastingin 3.5 m to 4 months 6)  Lipid Panel prior to visit, ICD-9: 7)  TSH prior to visit, ICD-9: 8)  PLS stop simvastatin, and start the new cholesterol med for safety. 9)  Flu vaccine today Prescriptions: LOVASTATIN 40 MG TABS (LOVASTATIN) Take 1 tab by  mouth at bedtime  #90 x 1   Entered and Authorized by:   Syliva Overman MD   Signed by:   Syliva Overman MD on 06/13/2010   Method used:   Printed then faxed to ...       [address] [phone]* (retail)       [address]       [place], [place]  [address]       Ph: [phone] or [phone]       Fax: [phone]   RxID:   [id number]    Orders Added: 1)  Est. Patient 40-64 years [99396] 2)  T-Basic Metabolic Panel [phone] 3)  T-Lipid Profile [phone] 4)  T-Hepatic Function [80076-22960] 5)  T-TSH [10932-35573] 6)  Pap Smear [88150] 7)  Hemoccult Guaiac-1 spec.(in office) [82270] 8)  Influenza Vaccine NON MCR [00028]   Immunizations Administered:  Influenza Vaccine # 1:    Vaccine Type: Fluvax Non-MCR    Site: right deltoid    Mfr: novartis    Dose: 0.5 ml    Route: IM    Given by: Adella Hare LPN    Exp. Date: 12/2010    Lot #: [id number]    VIS given: 03/19/10 version given June 13, 2010.   Immunizations Administered:  Influenza Vaccine # 1:    Vaccine Type: Fluvax Non-MCR    Site: right deltoid    Mfr: novartis    Dose: 0.5 ml    Route: IM    Given by: Adella Hare LPN    Exp. Date: 12/2010    Lot #: [id number]    VIS given: 03/19/10 version given June 13, 2010.  Laboratory Results  Date/Time Received: June 13, 2010 3:33 PM  Date/Time Reported: June 13, 2010 3:33 PM   Stool - Occult Blood Hemmoccult #1: negative Date: 06/13/2010 Comments: 51301 13L 10/13 118 10/12 [facility] LPN  June 13, 2010 3:33 PM

## 2010-09-26 NOTE — Letter (Signed)
Summary: xray  xray   Imported By: Curtis Sites 01/30/2010 11:22:36  _____________________________________________________________________  External Attachment:    Type:   Image     Comment:   External Document

## 2010-09-26 NOTE — Letter (Signed)
Summary: demographic  demographic   Imported By: Curtis Sites 01/30/2010 11:17:00  _____________________________________________________________________  External Attachment:    Type:   Image     Comment:   External Document

## 2010-09-26 NOTE — Letter (Signed)
Summary: progress notes  progress notes   Imported By: Curtis Sites 01/30/2010 11:21:12  _____________________________________________________________________  External Attachment:    Type:   Image     Comment:   External Document

## 2010-09-26 NOTE — Progress Notes (Signed)
Summary: MEDICINE  Phone Note Call from Patient   Summary of Call: NEEDS HER IBUP. 800 MG CALLED IN FOR 90  AT [facility] Initial call taken by: Lind Guest,  October 10, 2009 1:09 PM  Follow-up for Phone Call        Rx Called In Follow-up by: Adella Hare LPN,  October 10, 2009 1:24 PM    Prescriptions: IBUPROFEN 800 MG  TABS (IBUPROFEN) one tab by mouth three times a day as needed  #90 x 3   Entered by:   Adella Hare LPN   Authorized by:   Syliva Overman MD   Signed by:   Adella Hare LPN on 04/54/0981   Method used:   Electronically to        [facility] [phone]* (retail)       [address]       [place], [place]  [address]       Ph: [phone] or [phone]       Fax: [phone]   RxID:   [id number]

## 2010-09-26 NOTE — Assessment & Plan Note (Signed)
Summary: ov   Vital Signs:  Patient profile:   65 year old Desiree [name] status:  hysterectomy Height:      61 inches Weight:      195 pounds BMI:     36.98 O2 Sat:      98 % on Room air Pulse rate:   65 / minute Pulse rhythm:   regular Resp:     16 per minute BP sitting:   122 / 80  (left arm) Cuff size:   large  Vitals Entered By: Everitt Amber LPN (November 26, 1912 11:39 AM)  Nutrition Counseling: Patient's BMI is greater than 25 and therefore counseled on weight management options.  O2 Flow:  Room air CC: Follow up chornic problems   Primary Care Provider:  Syliva Overman MD  CC:  Follow up chornic problems.  History of Present Illness: Unilateral right facial weakness acutele 5 days, seemed  to worsen overnight last week, but since then stable.she has no other areas of weakness or numbness./ she denies any recent fever or chills. She denies headaches. She denies head or chest congestion. She clearly has anxiety abouither current symptoms which is understandable  Current Medications (verified): 1)  Omeprazole 20 Mg  Cpdr (Omeprazole) .... One Tab By Mouth Bid 2)  Ibuprofen 800 Mg  Tabs (Ibuprofen) .... One Tab By Mouth Three Times A Day As Needed 3)  Simvastatin 40 Mg Tabs (Simvastatin) .... Take 1 Tab By Mouth At Bedtime 4)  Oscal 500/200 D-3 500-200 Mg-Unit  Tabs (Calcium-Vitamin D) .... One Tab By Mouth Three Times A Day 5)  Multivitamins   Tabs (Multiple Vitamin) .... One Tab By Mouth Once Daily 6)  Anacin 81 Mg  Tbec (Aspirin) .... One Tab Po Qd 7)  Amlodipine Besylate 10 Mg  Tabs (Amlodipine Besylate) .... One Tab By Mouth At Bedtime 8)  Potassium 99 Mg  Tabs (Potassium) .... One Tab By Mouth Once Daily 9)  Hydrochlorothiazide 25 Mg  Tabs (Hydrochlorothiazide) .... One Tab By Mouth Once Daily 10)  Flexeril 10 Mg Tabs (Cyclobenzaprine Hcl) .... One Tab At Bedtime As Needed 11)  Triamcinolone Acetonide 0.5 % Crea (Triamcinolone Acetonide) .... Apply Sparingly To  Affected Areas Twice Daily As Needed 12)  Carvedilol 3.125 Mg Tabs (Carvedilol) .... Take 1 Tablet By Mouth Two Times A Day 13)  Claritin 10 Mg Tabs (Loratadine) .... Take 1 Tablet By Mouth Once A Day  Allergies (verified): 1)  ! Codeine  Review of Systems      See HPI Eyes:  Denies blurring and discharge. ENT:  Denies earache, hoarseness, nasal congestion, and sinus pressure. CV:  Denies chest pain or discomfort, palpitations, and swelling of feet. Resp:  Denies cough and sputum productive. GI:  Denies abdominal pain, constipation, diarrhea, nausea, and vomiting. GU:  Denies dysuria and urinary frequency. MS:  Denies joint pain and joint swelling. Neuro:  See HPI; Complains of weakness. Psych:  Denies anxiety and depression. Endo:  Denies cold intolerance, excessive hunger, excessive thirst, excessive urination, heat intolerance, polyuria, and weight change. Heme:  Denies abnormal bruising and bleeding. Allergy:  Complains of seasonal allergies; denies sneezing.  Physical Exam  General:  Well-developed,well-nourished,in no acute distress; alert,appropriate and cooperative throughout examination HEENT: facial asymmetry, with right facial weakness EOMI, No sinus tenderness, TM's Clear, oropharynx  pink and moist.   Chest: Clear to auscultation bilaterally.  CVS: S1, S2, No murmurs, No S3.   Abd: Soft, Nontender.  MS: Adequate ROM spine, hips, shoulders and  knees.  Ext: No edema.   CNS: CN 2-12 intact, power tone and sensation normal throughout.   Skin: Intact, no visible lesions or rashes.  Psych: Good eye contact, normal affect.  Memory intact, not anxious or depressed appearing.    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 No radiologic tests were ordred,  I do not believe this is a stroke. th pt was advised to start asprin 325mg  daily  Problem # 2:  HYPERTENSION (ICD-401.9) Assessment: Unchanged  Her updated medication list for this problem includes:    Amlodipine Besylate 10 Mg Tabs (Amlodipine besylate) ..... One tab by mouth at bedtime    Hydrochlorothiazide 25 Mg Tabs (Hydrochlorothiazide) ..... One tab by mouth once daily    Carvedilol 3.125 Mg Tabs (Carvedilol) .Marland Kitchen... Take 1 tablet by mouth two times a day  BP today: 122/80 Prior BP: 120/82 (07/27/2009)  Labs Reviewed: K+: 4.0 (07/28/2009) Creat: : 1.01 (07/28/2009)   Chol: 180 (07/28/2009)   HDL: 80 (07/28/2009)   LDL: 92 (07/28/2009)   TG: 42 (07/28/2009)  Problem # 3:  HYPERLIPIDEMIA (ICD-272.4) Assessment: Comment Only  Her updated medication list for this problem includes:    Simvastatin 40 Mg Tabs (Simvastatin) .Marland Kitchen... Take 1 tab by mouth at bedtime  Labs Reviewed: SGOT: 21 (07/28/2009)   SGPT: 14 (07/28/2009)   HDL:80 (07/28/2009), 94 (09/14/2008)  LDL:92 (07/28/2009), 67 (09/14/2008)  Chol:180 (07/28/2009), 170 (09/14/2008)  Trig:42 (07/28/2009), 43 (09/14/2008)  Complete Medication List: 1)  Omeprazole 20 Mg Cpdr (Omeprazole) .... One tab by mouth bid 2)  Ibuprofen 800 Mg Tabs (Ibuprofen) .... One tab by mouth three times a day as needed 3)  Simvastatin 40 Mg Tabs (Simvastatin) .... Take 1 tab by mouth at bedtime 4)  Oscal 500/200 D-3 500-200 Mg-unit Tabs (Calcium-vitamin d) .... One tab by mouth three times a day 5)  Multivitamins Tabs (Multiple vitamin) .... One tab by mouth once daily 6)  Anacin 81 Mg Tbec (Aspirin) .... One tab po qd 7)  Amlodipine Besylate 10 Mg Tabs (Amlodipine besylate) .... One tab by mouth at bedtime 8)  Potassium 99 Mg Tabs (Potassium) .... One tab by mouth once daily 9)  Hydrochlorothiazide 25 Mg Tabs (Hydrochlorothiazide) .... One tab by mouth once daily 10)  Flexeril 10 Mg Tabs (Cyclobenzaprine hcl) .... One tab at bedtime as needed 11)   Triamcinolone Acetonide 0.5 % Crea (Triamcinolone acetonide) .... Apply sparingly to affected areas twice daily as needed 12)  Carvedilol 3.125 Mg Tabs (Carvedilol) .... Take 1 tablet by mouth two times a day 13)  Claritin 10 Mg Tabs (Loratadine) .... Take 1 tablet by mouth once a day  Patient Instructions: 1)  F/U as before 2)  It is important that you exercise regularly at least 20 minutes 5 times a week. If you develop chest pain, have severe difficulty breathing, or feel very tired , stop exercising immediately and seek medical attention. 3)  You need to lose weight. Consider a lower calorie diet and regular exercise.  4)  You will be referred for a brain scan also for study to check blood flow in neck arteries(after speakingwith neurology, I anticipate an appt with them this week, and will defer imaging studies) 5)  You will be referred urgently to a neurologist. 6)  Pls  start regular asprin 325mg   one daily today. 7)  If you have any new symptoms or worsening of the current symptoms go to the Ed. 8)  I am concerned that this may be a sma;ll stroke or Bell's pakls6 Prescriptions: TRIAMCINOLONE ACETONIDE 0.5 % CREA (TRIAMCINOLONE ACETONIDE) apply sparingly to affected areas twice daily as needed  #30gm x 2   Entered by:   Adella Hare LPN   Authorized by:   Syliva Overman MD   Signed by:   Adella Hare LPN on 16/05/9603   Method used:   Electronically to        [facility] [phone]* (retail)       [address]       [place], [place]  [address]       Ph: [phone] or [phone]       Fax: [phone]   RxID:   [id number] FLEXERIL 10 MG TABS (CYCLOBENZAPRINE HCL) One tab at bedtime as needed  #30 x 3   Entered by:   Adella Hare LPN   Authorized by:   Syliva Overman MD   Signed by:   Adella Hare LPN on 74/25/9563   Method used:   Electronically to        [facility] [phone]* (retail)       [address]       [place],  [place]  [address]       Ph: [phone] or [phone]       Fax: [phone]   RxID:   [id number] SIMVASTATIN 40 MG TABS (SIMVASTATIN) Take 1 tab by mouth at bedtime  #30 x 3   Entered by:   Adella Hare LPN   Authorized by:   Syliva Overman MD   Signed by:   Adella Hare LPN on 61/60/7371   Method used:   Electronically to        [facility] [phone]* (retail)       [address]       [place], [place]  [address]       Ph: [phone] or [phone]       Fax: [phone]   RxID:   [id number] AMLODIPINE BESYLATE 10 MG  TABS (AMLODIPINE BESYLATE) one tab by mouth at bedtime  #90 Each x 1   Entered by:   Adella Hare LPN   Authorized by:   Syliva Overman MD   Signed by:   Adella Hare LPN on 53/61/4431   Method used:   Electronically to        [facility] [phone]* (retail)       [address]       [place], [place]  [address]       Ph: [phone] or [phone]       Fax: [phone]   RxID:   [id number]

## 2010-09-26 NOTE — Progress Notes (Signed)
[facility]   Imported By: Lind Guest 12/10/2009 10:52:44  _____________________________________________________________________  External Attachment:    Type:   Image     Comment:   External Document

## 2010-09-26 NOTE — Progress Notes (Signed)
Summary: rx  Phone Note Call from Patient   Summary of Call: needs her rx called into [facility]. Meloxicam  7.5mg    [phone] Initial call taken by: Rudene Anda,  January 28, 2010 4:59 PM  Follow-up for Phone Call        Rx Called In Follow-up by: Adella Hare LPN,  January 28, 3499 5:08 PM    Prescriptions: MELOXICAM 7.5 MG TABS (MELOXICAM) Take 1 tablet by mouth once a day as needed start  the week of the 23rd May, no more than 4 days per week  #30 x 2   Entered by:   Adella Hare LPN   Authorized by:   Syliva Overman MD   Signed by:   Adella Hare LPN on 93/81/8299   Method used:   Electronically to        [facility] [phone]* (retail)       [address]       [place], [place]  [address]       Ph: [phone] or [phone]       Fax: [phone]   RxID:   [id number]

## 2010-09-26 NOTE — Letter (Signed)
Summary: phone notes  phone notes   Imported By: Curtis Sites 01/30/2010 11:18:08  _____________________________________________________________________  External Attachment:    Type:   Image     Comment:   External Document

## 2010-09-26 NOTE — Letter (Signed)
Summary: misc. chart 1  misc. chart 1   Imported By: Curtis Sites 05/13/2010 15:05:23  _____________________________________________________________________  External Attachment:    Type:   Image     Comment:   External Document

## 2010-09-26 NOTE — Letter (Signed)
Summary: Pap Smear, Normal Letter, [facility]  [address]   [place], [place] [address]   Phone: [phone]  Fax: [phone]          June 19, 2010    Dear: Desiree [name]    I am pleased to notify you that your PAP smear was normal.  You will need your next PAP smear in:     ____ 3 Months    ____ 6 Months    ____ 12 Months    Please call the office at our office number above, to schedule your next appointment.    Sincerely,     [facility]

## 2010-09-26 NOTE — Progress Notes (Signed)
Summary: back pain  Phone Note Call from Patient   Summary of Call: having back pain and doesn't have appt until 09/24/2010. there are no opens so far this week. she states needs to be seen. back is hurting really bad. 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  Reports vag swelling also has appt in 2 weeks will adress at that time Follow-up by: Syliva Overman MD,  September 09, 2010 12:30 PM  Additional Follow-up for Phone Call Additional follow up Details #1::        nurse visit today for toradol 60mg  Im and depomedrol 80mg  im for back pain pls Additional Follow-up by: Syliva Overman MD,  September 09, 2010 12:30 PM    Additional Follow-up for Phone Call Additional follow up Details #2::    patient aware and on her way to office Follow-up by: Adella Hare LPN,  September 09, 2010 1:18 PM

## 2010-09-26 NOTE — Assessment & Plan Note (Signed)
Summary: dizzy- room2   Vital Signs:  Patient profile:   65 year old female Menstrual status:  hysterectomy Height:      61 inches Weight:      194 pounds BMI:     36.79 O2 Sat:      96 % on Room air Pulse rate:   78 / minute Resp:     16 per minute BP supine:   108 / 70  (left arm) BP sitting:   102 / 70  (left arm) BP standing:   90 / 60  (left arm)  Vitals Entered By: Adella Hare LPN (March 05, 2010 1:27 PM) CC: dizzy- room 2 Is Patient Diabetic? No Pain Assessment Patient in pain? no      Comments did not bring meds to ov   Primary Provider:  Syliva Overman MD  CC:  dizzy- room 2.  History of Present Illness: Pt presents today with c/o dizziness.  States when she awoke this morning lying in bed noticed she was dizzy.  She has had dizziness during the day today when ever she bends over and stands up.  Also with other movements.  She denies HA.  No numbness or tingling.  No chest pain, pressure or palptiations.  She did have this happen a few yrs ago. Lasted for a day and then went away.  Pt has a hx of htn.  Taking meds as prescribed.  No changes to meds recently.  BP has been well controlled. Recent Bells Palsy.  Sxs resolved in 4 days.   Allergies (verified): 1)  ! Codeine  Past History:  Past medical history reviewed for relevance to current acute and chronic problems.  Past Medical History: Reviewed history from 01/09/2010 and no changes required. 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 Eyes:  Denies blurring and double vision. ENT:  Denies earache, nasal congestion, sinus pressure, and sore throat. CV:  Complains of lightheadness; denies chest pain or discomfort and palpitations. Resp:  Denies shortness of breath. GI:  Denies  abdominal pain, nausea, and vomiting. Neuro:  Complains of sensation of room spinning; denies headaches.  Physical Exam  General:  Well-developed,well-nourished,in no acute distress; alert,appropriate and cooperative throughout examination Head:  Normocephalic and atraumatic without obvious abnormalities. No apparent alopecia or balding. Eyes:  No corneal or conjunctival inflammation noted. EOMI. Perrla. Funduscopic exam benign, without hemorrhages, exudates or papilledema. no nystagmus.   Ears:  External ear exam shows no significant lesions or deformities.  Otoscopic examination reveals clear canals, tympanic membranes are intact bilaterally without bulging, retraction, inflammation or discharge. Hearing is grossly normal bilaterally. Nose:  External nasal examination shows no deformity or inflammation. Nasal mucosa are pink and moist without lesions or exudates. Mouth:  Oral mucosa and oropharynx without lesions or exudates.  Teeth in good repair. Neck:  No deformities, masses, or tenderness noted. Lungs:  Normal respiratory effort, chest expands symmetrically. Lungs are clear to auscultation, no crackles or wheezes. Heart:  Normal rate and regular rhythm. S1 and S2 normal without gallop, murmur, click, rub or other extra sounds. Abdomen:  soft, non-tender, no masses, no hepatomegaly, and no splenomegaly.   Extremities:  No clubbing, cyanosis, edema, or deformity noted with normal full range of motion of all joints.   Neurologic:  alert & oriented X3, cranial nerves II-XII intact, strength  normal in all extremities, sensation intact to light touch, gait normal, and DTRs symmetrical and normal.   Cervical Nodes:  No lymphadenopathy noted Psych:  Cognition and judgment appear intact. Alert and cooperative with normal attention span and concentration. No apparent delusions, illusions, hallucinations   Impression & Recommendations:  Problem # 1:  DIZZINESS (ICD-780.4) Assessment New  Her  updated medication list for this problem includes:    Claritin 10 Mg Tabs (Loratadine) .Marland Kitchen... Take 1 tablet by mouth once a day    Meclizine Hcl 12.5 Mg Tabs (Meclizine hcl) .Marland Kitchen... Take 1 every 8 hrs as needed for dizziness  Orders: EKG w/ Interpretation (93000) T-Basic Metabolic Panel ([id number]) T-CBC w/Diff ([id number])  Problem # 2:  HYPERTENSION (ICD-401.9) Assessment: Comment Only BP is a little lower today than usual for her and she had a mild orthostatic change when standing.  Her updated medication list for this problem includes:    Amlodipine Besylate 10 Mg Tabs (Amlodipine besylate) ..... One tab by mouth at bedtime    Hydrochlorothiazide 25 Mg Tabs (Hydrochlorothiazide) ..... One tab by mouth once daily    Carvedilol 3.125 Mg Tabs (Carvedilol) .Marland Kitchen... Take 1 tablet by mouth two times a day  Orders: T-Basic Metabolic Panel ([id number])  BP today: 102/70 Prior BP: 120/74 (01/09/2010)  Labs Reviewed: K+: 3.5 (01/15/2010) Creat: : 1.02 (01/15/2010)   Chol: 170 (01/15/2010)   HDL: 86 (01/15/2010)   LDL: 76 (01/15/2010)   TG: 39 (01/15/2010)  Complete Medication List: 1)  Omeprazole 20 Mg Cpdr (Omeprazole) .... One tab by mouth bid 2)  Simvastatin 40 Mg Tabs (Simvastatin) .... Take 1 tab by mouth at bedtime 3)  Oscal 500/200 D-3 500-200 Mg-unit Tabs (Calcium-vitamin d) .... One tab by mouth three times a day 4)  Multivitamins Tabs (Multiple vitamin) .... One tab by mouth once daily 5)  Amlodipine Besylate 10 Mg Tabs (Amlodipine besylate) .... One tab by mouth at bedtime 6)  Potassium 99 Mg Tabs (Potassium) .... One tab by mouth once daily 7)  Hydrochlorothiazide 25 Mg Tabs (Hydrochlorothiazide) .... One tab by mouth once daily 8)  Triamcinolone Acetonide 0.5 % Crea (Triamcinolone acetonide) .... Apply sparingly to affected areas twice daily as needed 9)  Carvedilol 3.125 Mg Tabs (Carvedilol) .... Take 1 tablet by mouth two times a day 10)  Claritin 10 Mg Tabs (Loratadine)  .... Take 1 tablet by mouth once a day 11)  Aspirin 325 Mg Tabs (Aspirin) .... Take 1 tablet by mouth once a day 12)  Meloxicam 7.5 Mg Tabs (Meloxicam) .... Take 1 tablet by mouth once a day as needed start  the week of the 23rd may, no more than 4 days per week 13)  Meclizine Hcl 12.5 Mg Tabs (Meclizine hcl) .... Take 1 every 8 hrs as needed for dizziness  Patient Instructions: 1)  Keep your next appt. 2)  Increase your fluid intake. 3)  I have prescribed a medication for you for dizziness. 4)  I have ordered blood work for you to have drawn today. Prescriptions: MECLIZINE HCL 12.5 MG TABS (MECLIZINE HCL) take 1 every 8 hrs as needed for dizziness  #15 x 0   Entered and Authorized by:   Esperanza Sheets PA   Signed by:   Esperanza Sheets PA on 03/05/2010   Method used:   Electronically to        [facility] [id number]* (retail)       [address]       [place]  [place], [place]  [address]       Ph: [phone] or [phone]       Fax: [phone]   RxID:   [id number]

## 2010-09-26 NOTE — Assessment & Plan Note (Signed)
Summary: PER JAIME  Nurse Visit   Vital Signs:  Patient profile:   65 year old female Menstrual status:  hysterectomy Height:      61 inches Weight:      191 pounds BP sitting:   130 / 80  (left arm) Cuff size:   large  Vitals Entered By: Everitt Amber LPN (September 09, 2010 2:32 PM) CC: Patient in to get injections for back pain per dr. Lodema Hong  Comments toradol 60, depo 80   Allergies: 1)  ! Codeine  Medication Administration  Injection # 1:    Medication: Depo- Medrol 80mg     Diagnosis: BACK PAIN (ICD-724.5)    Route: IM    Site: RUOQ gluteus    Exp Date: 02/2011    Lot #: Gunnar Bulla    Mfr: Pharmacia    Comments: 80mg  given     Patient tolerated injection without complications    Given by: Everitt Amber LPN (September 09, 2010 2:34 PM)  Injection # 2:    Medication: Ketorolac-Toradol 15mg     Diagnosis: BACK PAIN (ICD-724.5)    Route: IM    Site: LUOQ gluteus    Exp Date: 01/2012    Lot #: [id number]     Mfr: [email or web address]     Comments: 60mg  given     Patient tolerated injection without complications    Given by: Everitt Amber LPN (September 09, 2010 2:35 PM)  Orders Added: 1)  Depo- Medrol 80mg  [J1040] 2)  Ketorolac-Toradol 15mg  [J1885] 3)  Admin of Therapeutic Inj  intramuscular or subcutaneous [96372]   Medication Administration  Injection # 1:    Medication: Depo- Medrol 80mg     Diagnosis: BACK PAIN (ICD-724.5)    Route: IM    Site: RUOQ gluteus    Exp Date: 02/2011    Lot #: Gunnar Bulla    Mfr: Pharmacia    Comments: 80mg  given     Patient tolerated injection without complications    Given by: Everitt Amber LPN (September 09, 2010 2:34 PM)  Injection # 2:    Medication: Ketorolac-Toradol 15mg     Diagnosis: BACK PAIN (ICD-724.5)    Route: IM    Site: LUOQ gluteus    Exp Date: 01/2012    Lot #: [id number]     Mfr: [email or web address]     Comments: 60mg  given     Patient tolerated injection without complications    Given by: Everitt Amber LPN (September 09, 2010 2:35 PM)  Orders  Added: 1)  Depo- Medrol 80mg  [J1040] 2)  Ketorolac-Toradol 15mg  [J1885] 3)  Admin of Therapeutic Inj  intramuscular or subcutaneous [[id number]]  Injections administered , pt tolerated them

## 2010-09-26 NOTE — Progress Notes (Signed)
Summary: MEDICINE  Phone Note Call from Patient   Summary of Call: CALL HER ABOUT HER MEDICINE DR PUT HER ON BAYER 325 MG AND DOES SHE STILL NEED TO TAKE HER IBUO. 800MG  Initial call taken by: Lind Guest,  November 27, 2009 10:08 AM  Follow-up for Phone Call        stop ibuprofen Follow-up by: Syliva Overman MD,  November 29, 2009 4:56 PM  Additional Follow-up for Phone Call Additional follow up Details #1::        called patient, left message Additional Follow-up by: Adella Hare LPN,  November 30, 2009 2:30 PM    Additional Follow-up for Phone Call Additional follow up Details #2::    patient aware Follow-up by: Adella Hare LPN,  November 30, 2009 3:20 PM

## 2010-09-27 ENCOUNTER — Ambulatory Visit: Payer: PRIVATE HEALTH INSURANCE | Admitting: Urology

## 2010-09-27 ENCOUNTER — Encounter: Payer: Self-pay | Admitting: Family Medicine

## 2010-09-27 DIAGNOSIS — N8111 Cystocele, midline: Secondary | ICD-10-CM

## 2010-09-27 DIAGNOSIS — N952 Postmenopausal atrophic vaginitis: Secondary | ICD-10-CM

## 2010-09-27 DIAGNOSIS — N393 Stress incontinence (female) (male): Secondary | ICD-10-CM

## 2010-10-02 NOTE — Assessment & Plan Note (Signed)
Summary: follow up 3 months/slj   Vital Signs:  Patient profile:   65 year old female Menstrual status:  hysterectomy Height:      61 inches Weight:      190.50 pounds BMI:     36.12 O2 Sat:      99 % on Room air Pulse rate:   63 / minute Pulse rhythm:   regular Resp:     16 per minute BP sitting:   130 / 90  (left arm)  Vitals Entered By: Adella Hare LPN (September 24, 2010 2:29 PM)  Nutrition Counseling: Patient's BMI is greater than 25 and therefore counseled on weight management options.  O2 Flow:  Room air CC: follow-up visit Is Patient Diabetic? No   Primary Care Provider:  Syliva Overman MD  CC:  follow-up visit.  History of Present Illness: 3 week h/o vaginal fullness and pressure, no previous spisodes, no aggravating factors. Reports  that she ahs otherwise been doing well. Denies recent fever or chills. Denies sinus pressure, nasal congestion , ear pain or sore throat. Denies chest congestion, or cough productive of sputum. Denies chest pain, palpitations, PND, orthopnea or leg swelling. Denies abdominal pain, nausea, vomitting, diarrhea or constipation. Denies change in bowel movements or bloody stool. Denies dysuria , frequency, incontinence or hesitancy. Denies  joint pain, swelling, or reduced mobility. Denies headaches, vertigo, seizures. Denies depression, anxiety or insomnia. Denies  rash, lesions, or itch.     Current Medications (verified): 1)  Omeprazole 20 Mg  Cpdr (Omeprazole) .... One Tab By Mouth Two Times A Day. 2)  Oscal 500/200 D-3 500-200 Mg-Unit  Tabs (Calcium-Vitamin D) .... One Tab By Mouth Three Times A Day 3)  Multivitamins   Tabs (Multiple Vitamin) .... One Tab By Mouth Once Daily 4)  Amlodipine Besylate 10 Mg  Tabs (Amlodipine Besylate) .... One Tab By Mouth At Bedtime 5)  Hydrochlorothiazide 25 Mg  Tabs (Hydrochlorothiazide) .... One Tab By Mouth Once Daily 6)  Triamcinolone Acetonide 0.5 % Crea (Triamcinolone Acetonide) ....  Apply Sparingly To Affected Areas Twice Daily As Needed 7)  Carvedilol 3.125 Mg Tabs (Carvedilol) .... Take 1 Tablet By Mouth Two Times A Day 8)  Aspirin 325 Mg Tabs (Aspirin) .... Take 1 Tablet By Mouth Once A Day 9)  Klor-Con M20 20 Meq Cr-Tabs (Potassium Chloride Crys Cr) .... One Tab By Mouth Qd 10)  Cyclobenzaprine Hcl 10 Mg Tabs (Cyclobenzaprine Hcl) .Marland Kitchen.. 1 Tab As Needed 11)  Allergy and Sinus Headache .Marland Kitchen.. 1 Tab Daily 12)  Lovastatin 40 Mg Tabs (Lovastatin) .... Take 1 Tab By Mouth At Bedtime  Allergies (verified): 1)  ! Codeine  Review of Systems      See HPI General:  Complains of fatigue. Eyes:  Denies discharge and red eye. Endo:  Denies cold intolerance, excessive hunger, excessive thirst, excessive urination, and heat intolerance. Heme:  Denies abnormal bruising and bleeding. Allergy:  Denies hives or rash and itching eyes.  Physical Exam  General:  Well-developed,well-nourished,in no acute distress; alert,appropriate and cooperative throughout examination HEENT: No facial asymmetry,  EOMI, No sinus tenderness, TM's Clear, oropharynx  pink and moist.   Chest: Clear to auscultation bilaterally.  CVS: S1, S2, No murmurs, No S3.   Abd: Soft, Nontender.  MS: Adequate ROM spine, hips, shoulders and knees.  Ext: No edema.   CNS: CN 2-12 intact, power tone and sensation normal throughout.   Skin: Intact, no visible lesions or rashes.  Psych: Good eye contact, normal affect.  Memory intact, not anxious or depressed appearing. Pelvic;no signoificamnt prolapse appreciated, however vaginal mass firm palpated, pt reports full bladder however, she has no womb   Impression & Recommendations:  Problem # 1:  BLADDER PROLAPSE (ICD-596.9) Assessment Comment Only  Future Orders: Urology Referral (Urology) ... 09/26/2010  Problem # 2:  HYPERTENSION (ICD-401.9) Assessment: Deteriorated  Her updated medication list for this problem includes:    Amlodipine Besylate 10 Mg Tabs  (Amlodipine besylate) ..... One tab by mouth at bedtime    Hydrochlorothiazide 25 Mg Tabs (Hydrochlorothiazide) ..... One tab by mouth once daily    Carvedilol 3.125 Mg Tabs (Carvedilol) .Marland Kitchen... Take 1 tablet by mouth two times a day Patient advised to follow low sodium diet rich in fruit and vegetables, and to commit to at least 30 minutes 5 days per week of regular exercise , to improve blood presure control.   Orders: T-Basic Metabolic Panel [id number])  BP today: 130/90 Prior BP: 130/80 (09/09/2010)  Labs Reviewed: K+: 3.7 (09/14/2010) Creat: : 1.08 (09/14/2010)   Chol: 165 (09/14/2010)   HDL: 78 (09/14/2010)   LDL: 81 (09/14/2010)   TG: 32 (09/14/2010)  Problem # 3:  HYPERLIPIDEMIA (ICD-272.4) Assessment: Unchanged  Her updated medication list for this problem includes:    Lovastatin 40 Mg Tabs (Lovastatin) .Marland Kitchen... Take 1 tab by mouth at bedtime  Orders: T-Lipid Profile [id number]) T-Hepatic Function [id number])  Labs Reviewed: SGOT: 23 (09/14/2010)   SGPT: 12 (09/14/2010)   HDL:78 (09/14/2010), 86 (01/15/2010)  LDL:81 (09/14/2010), 76 (01/15/2010)  Chol:165 (09/14/2010), 170 (01/15/2010)  Trig:32 (09/14/2010), 39 (01/15/2010)  Problem # 4:  HYPERTENSION (ICD-401.9)  Complete Medication List: 1)  Omeprazole 20 Mg Cpdr (Omeprazole) .... One tab by mouth two times a day. 2)  Oscal 500/200 D-3 500-200 Mg-unit Tabs (Calcium-vitamin d) .... One tab by mouth three times a day 3)  Multivitamins Tabs (Multiple vitamin) .... One tab by mouth once daily 4)  Amlodipine Besylate 10 Mg Tabs (Amlodipine besylate) .... One tab by mouth at bedtime 5)  Hydrochlorothiazide 25 Mg Tabs (Hydrochlorothiazide) .... One tab by mouth once daily 6)  Triamcinolone Acetonide 0.5 % Crea (Triamcinolone acetonide) .... Apply sparingly to affected areas twice daily as needed 7)  Carvedilol 3.125 Mg Tabs (Carvedilol) .... Take 1 tablet by mouth two times a day 8)  Aspirin 325 Mg Tabs (Aspirin) .... Take  1 tablet by mouth once a day 9)  Klor-con M20 20 Meq Cr-tabs (Potassium chloride crys cr) .... One tab by mouth qd 10)  Cyclobenzaprine Hcl 10 Mg Tabs (Cyclobenzaprine hcl) .Marland Kitchen.. 1 tab as needed 11)  Allergy and Sinus Headache  .Marland Kitchen.. 1 tab daily 12)  Lovastatin 40 Mg Tabs (Lovastatin) .... Take 1 tab by mouth at bedtime  Other Orders: T-CBC w/Diff ([id number]) T- Hemoglobin A1C ([id number]) Radiology Referral (Radiology)  Patient Instructions: 1)  Follow up appointment in 5.30months 2)  It is important that you exercise regularly at least 30 minutes 5 times a week. If you develop chest pain, have severe difficulty breathing, or feel very tired , stop exercising immediately and seek medical attention. 3)  You need to lose weight. Consider a lower calorie diet and regular exercise.  4)  we will give you and review the [facility] low carb diet info, your fasting sugar is slightly elevated 5)  BMP prior to visit, ICD-9: 6)  Hepatic Panel prior to visit, ICD-9: 7)  Lipid Panel prior to visit, ICD-9:  fasting in 5.5 months 8)  CBC w/ Diff prior  to visit, ICD-9: 9)  HbgA1C prior to visit, ICD-9: 10)  You will be referred for urologic evaluation   Orders Added: 1)  Est. Patient Level IV [[address]] 2)  T-Basic Metabolic Panel [[id number]] 3)  T-Lipid Profile [[id number]] 4)  T-Hepatic Function [[id number]] 5)  T-CBC w/Diff [[id number]] 6)  T- Hemoglobin A1C [[id number]] 7)  Radiology Referral [Radiology] 8)  Urology Referral [Urology]

## 2010-10-10 NOTE — Letter (Signed)
Summary: [facility]  [facility]   Imported By: Lind Guest 10/01/2010 14:50:49  _____________________________________________________________________  External Attachment:    Type:   Image     Comment:   External Document

## 2010-11-13 ENCOUNTER — Ambulatory Visit (HOSPITAL_COMMUNITY)
Admission: RE | Admit: 2010-11-13 | Discharge: 2010-11-13 | Disposition: A | Discharge status: Home / Self Care | Payer: PRIVATE HEALTH INSURANCE | Source: Ambulatory Visit | Attending: Family Medicine | Admitting: Family Medicine

## 2010-11-13 DIAGNOSIS — Z853 Personal history of malignant neoplasm of breast: Secondary | ICD-10-CM | POA: Insufficient documentation

## 2010-11-13 DIAGNOSIS — Z9889 Other specified postprocedural states: Secondary | ICD-10-CM

## 2010-11-15 ENCOUNTER — Other Ambulatory Visit: Payer: Self-pay | Admitting: *Deleted

## 2010-11-15 ENCOUNTER — Encounter: Payer: Self-pay | Admitting: Family Medicine

## 2010-11-15 MED ORDER — LORATADINE 10 MG PO TABS: 10.0000 mg | ORAL_TABLET | Freq: Every day | ORAL | Status: DC

## 2010-11-18 ENCOUNTER — Encounter: Payer: Self-pay | Admitting: Family Medicine

## 2010-12-18 ENCOUNTER — Other Ambulatory Visit: Payer: Self-pay | Admitting: Family Medicine

## 2010-12-18 ENCOUNTER — Telehealth: Payer: Self-pay | Admitting: Family Medicine

## 2010-12-23 ENCOUNTER — Other Ambulatory Visit: Payer: Self-pay

## 2010-12-23 MED ORDER — LOVASTATIN 40 MG PO TABS: 40.0000 mg | ORAL_TABLET | Freq: Every day | ORAL | Status: DC

## 2010-12-25 NOTE — Telephone Encounter (Signed)
Error

## 2011-01-02 ENCOUNTER — Other Ambulatory Visit: Payer: Self-pay

## 2011-01-02 ENCOUNTER — Telehealth: Payer: Self-pay | Admitting: Family Medicine

## 2011-01-02 MED ORDER — OMEPRAZOLE 20 MG PO CPDR: 20.0000 mg | DELAYED_RELEASE_CAPSULE | Freq: Every day | ORAL | Status: DC

## 2011-01-02 NOTE — Telephone Encounter (Signed)
Advised Coricidin but to check with the pharmacist to be sure

## 2011-01-07 NOTE — H&P (Signed)
NAMECHRISTINIA, [name]               ACCOUNT NO.:  [id number]   MEDICAL RECORD NO.:  [id number]          PATIENT TYPE:  AMB   LOCATION:  DAY                           FACILITY:  [facility]   PHYSICIAN:  Kassie Mends, M.D.      DATE OF BIRTH:  02/17/1946   DATE OF ADMISSION:  DATE OF DISCHARGE:  LH                              HISTORY & PHYSICAL   REFERRING PHYSICIAN:  Milus Mallick. Lodema Hong, MD   REASON FOR CONSULTATION:  Abdominal pain and burping.   HISTORY OF PRESENT ILLNESS:  Desiree [name] is a 65 year old female who  complained of pain in her right side.  Her symptoms have been going on  for 3-4 years.  They are not always after eating or drinking water.  They do not have been every day.  She denies any nausea, vomiting,  fever, or chills.  She has had no problems with swallowing.  No weight  loss.  She had increased 10 pounds in 2 years.  She does use aspirin and  ibuprofen.  Sometimes, she may use greater than 2 once a day.  She uses  ibuprofen greater than 3 times a week.  Her appetite is good.  She  denies any vomiting, blood in her stool, or black stools.  Last  colonoscopy was in 1998.  She reports not having any polyps.  She never  had an upper endoscopy.  The pain that she experiences is under her  right ribs and top of her stomach.  It does not radiate.  It lasts for  minutes.  It has been severe, but in the last year, she has not had any  severe episodes.  Nothing really makes it better.  It occurs randomly.  Omeprazole seemed to improve it, but not make it completely resolved.  She has been on omeprazole for about a year.  She was on Nexium before  that and also Pepcid.  Her nausea resolved after receiving penicillin  for sinusitis.  She has not had any problems with sedation binds her  up.  She does not strain with bowel movements and she has 1 daily.  If  she does not take her omeprazole, she does have problems swallowing  solids and liquids.   PAST MEDICAL HISTORY:  1.  Hypertension.  2. Arthritis.  3. GERD.  4. Stage 0 breast cancer treated with XRT only.   PAST SURGICAL HISTORY:  1. Neck surgery.  2. Hand surgery.  3. Lysis of adhesions in 1997.  4. Hysterectomy due to fibroids in 1992.  5. Lumpectomy on the right.   ALLERGIES:  CODEINE causes her to be crazy.   MEDICATIONS:  1. Calcium.  2. Omeprazole 20 mg daily.  3. Hydrochlorothiazide 25 mg daily.  4. Ibuprofen 800 mg up to 3 tablets a day.  5. Simvastatin 40 mg at bedtime.  6. Amlodipine 10 mg at bedtime.  7. Potassium daily.  8. Aspirin 81 mg daily.  9. Multivitamin.   FAMILY HISTORY:  She denies any family history of colon cancer, colon  polyps, ovarian or uterine cancer.  Her sister and  2 maternal aunts had  breast cancer.   SOCIAL HISTORY:  She is divorced and has 4 kids.  Her youngest is 1.  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  She denies any tobacco or  alcohol use.   REVIEW OF SYSTEMS:  Per the HPI, otherwise all systems are negative.   PHYSICAL EXAMINATION:  VITAL SIGNS:  Weight 194 pounds, height 5 feet 1,  BMI 36.7 (severely obese), temperature 97.7, blood pressure 126/82,  pulse 16.  GENERAL:  She is in no apparent distress, alert and oriented x4. HEENT:  Atraumatic, normocephalic.  Pupils equal and reactive to light.  Mouth,  no oral lesions.  Posterior pharynx without erythema or exudate. NECK:  Full range of motion.  No lymphadenopathy. LUNGS:  Clear to auscultation  bilaterally. CARDIOVASCULAR:  Regular rate and rhythm.  No murmur.  Normal S1 and S2. ABDOMEN:  Bowel sounds are present, soft, nontender,  nondistended.  No rebound or guarding. EXTREMITIES:  No cyanosis or  edema. NEURO:  She has no focal neurologic deficit.   ASSESSMENT:  Desiree [name] is a 65 year old female with abdominal pain and  new onset dyspepsia.  She also needs average risk colon cancer  screening.  Thank you for allowing me to see Desiree [name] in consultation.  My recommendations  follow.   RECOMMENDATIONS:  1. She is to increase her Prilosec to 30 minutes prior to meals and      twice a day.  2. She will be scheduled for an upper endoscopy and a colonoscopy      after September 04, 2008.  3. She will obtain a CMP, lipase, and CBC with diff on today.  We will      call her after 2:30 at home with the results of her labs.  4. She has outpatient visit to see me in 2 months to reevaluate her      abdominal pain.      Kassie Mends, M.D.  Electronically Signed     SM/MEDQ  D:  08/23/2008  T:  08/23/2008  Job:  [id number]   cc:   Desiree [name], M.D.  Fax: [phone]

## 2011-01-07 NOTE — Cardiovascular Report (Signed)
Desiree [name], Desiree [name] NO.:  [id number]   MEDICAL RECORD NO.:  [id number]          PATIENT TYPE:  OIB   LOCATION:  2899                         FACILITY:  [facility]   PHYSICIAN:  Sheliah Mends, MD      DATE OF BIRTH:  October 24, 1945   DATE OF PROCEDURE:  02/01/2009  DATE OF DISCHARGE:  02/01/2009                            CARDIAC CATHETERIZATION   INDICATION:  This is a 65 year old lady with history of chest pain and  thoracic aneurysm.  The patient has multiple risk factors for coronary  artery disease including hypertension and dyslipidemia.  She presented  with complaints of severe exertional chest pain.  She was subsequently  ruled out for dissection and found to have a 4.6- x 4.6-cm thoracic  aneurysm without evidence of dissection.  Subsequently, she was referred  for a cardiac catheterization.   PROCEDURES:  1. Left heart catheterization.  2. Selective coronary angiography.  3. Left ventriculography.  4. Aortic root imaging.   FINDINGS:  1. Hemodynamics:  Left ventricular pressure 145/3 mmHg, aortic      pressure 129/69 mmHg.  2. Left ventriculography:  Normal left ventricular size and function      with an ejection fraction of 65%.  3. Coronary angiography:  Left main coronary artery:  The left main coronary artery is a medium-  sized vessel that bifurcates into the LAD and left circumflex artery.  There is no angiographically significant disease in the left main  coronary artery is seen.  Left anterior descending coronary artery:  The left anterior descending  coronary artery is of medium size.  It gives off a medium-sized diagonal  1 and diagonal 2 branch.  There is no angiographically significant  disease seen in the LAD and diagonal arteries.  Left circumflex artery:  The left circumflex artery is small and  nondominant.  There is no angiographically significant disease seen in  the left circumflex artery.  The right coronary artery:  The right  coronary artery is very large and  a dominant vessel.  It gives off a large branching PDA.  There is no  angiographically significant disease seen in the right coronary artery  territory.  Aortic root imaging:  Aortic root imaging shows the aortic root diameter  at the upper limit of normal.   SUMMARY:  1. Normal coronary arteries without any evidence of angiographic      significant disease.  2. Normal left ventricular size and function.  3. No significant aortic valve gradient.  No significant mitral      regurgitation.  4. Aortic root size at the upper limit of normal.   PROCEDURE:  After informed consent was obtained, the patient was brought  to the second floor cardiac catheterization lab.  She was draped in  sterile fashion and started on conscious sedation using Versed and  fentanyl and subsequently, local anesthesia using lidocaine 1% was  applied to the right groin.  A 5-French arterial sheath was  inserted in the right femoral artery using the Seldinger technique.  Subsequently, selective coronary angiography of the left and right  coronary arteries was  performed using a 5-French #4 left and right  Judkins catheter.  Left ventriculography and aortic root imaging were  performed using a standard pigtail catheter.      Sheliah Mends, MD  Electronically Signed     JE/MEDQ  D:  02/01/2009  T:  02/01/2009  Job:  [id number]

## 2011-01-07 NOTE — Op Note (Signed)
NAMECECELY, Desiree [name]               ACCOUNT NO.:  [id number]   MEDICAL RECORD NO.:  [id number]          PATIENT TYPE:  AMB   LOCATION:  DAY                           FACILITY:  [facility]   PHYSICIAN:  Kassie Mends, M.D.      DATE OF BIRTH:  [birth year]   DATE OF PROCEDURE:  09/07/2008  DATE OF DISCHARGE:                               OPERATIVE REPORT   REFERRING PHYSICIAN:  Syliva Overman.   PROCEDURES:  1. Colonoscopy with cold forceps polypectomy.  2. Esophagogastroduodenoscopy with cold forceps biopsy.   INDICATION FOR EXAMINATION:  Desiree [name] is a 65 year old female who  presents with new-onset dyspepsia.  She also presents for average risk  colon cancer screening.   FINDINGS:  1. Tortuous colon.  A 4-mm sessile descending colon polyp removed via      cold forceps.  A 6-mm sessile transverse colon polyp removed via      cold forceps.  2. Single diverticulum in the transverse colon.  Frequent diverticula      seen in the sigmoid colon.  Otherwise, no masses, inflammatory      changes, or AVMs seen.  3. Normal retroflexed view of the rectum.  4. Normal esophagus without evidence of Barrett mass, erosion,      ulceration, or stricture.  5. Patchy erythema in the antrum with erosions.  Biopsies obtained via      cold forceps to evaluate for H. pylori gastritis.  6. Normal duodenal bulb and second portion of the duodenum.   DIAGNOSES:  1. Dyspepsia likely secondary to mild gastritis.  2. Colon polyps.  3. Mild diverticulosis.   RECOMMENDATIONS:  1. No aspirin or NSAIDs for 30 days.  No anticoagulation for 7 days.  2. If she has a simple adenoma, screening colonoscopy in 10 years.  3. We will call Desiree [name] with results of her biopsies.  4. She should continue the omeprazole daily.  5. She should follow a high-fiber diet.  She is given a handout on      high-fiber diet polyps and diverticulosis.  6. She already has a followup appointment to see me in 2 months.    MEDICATIONS:  1. Demerol 75 mg IV.  2. Versed 6 mg IV.   PROCEDURE TECHNIQUE:  Physical exam was performed.  Informed consent was  obtained from the patient after explaining the benefits, risks, and  alternatives to the procedure.  The patient was connected to the monitor  and placed in left lateral position.  Continuous oxygen was provided by  nasal cannula.  IV medicine administered through an indwelling cannula.  After administration of sedation and rectal exam, the patient's rectum  was intubated.  The scope was advanced under direct visualization to the  cecum.  The scope was removed slowly by carefully examining the color,  texture, anatomy, and integrity of the mucosa on the way out.   After the colonoscopy, the patient's esophagus was intubated with a  diagnostic gastroscope.  The scope was advanced under direct  visualization to the second portion of the duodenum.  The scope was  removed slowly by  carefully examining the color, texture, anatomy, and  integrity of the mucosa on the way out.  The patient was recovered in  endoscopy and discharged home in satisfactory condition.   Labs from August 22, 2008:  White count 5.1, hemoglobin 12, platelets  299, potassium 3.9, BUN 17, creatinine 0.93, total bilirubin 0.6, alk  phos 89, AST 37, ALT 18, total protein 7.7, albumin 4.2, calcium 9.3,  lipase 42.   PATH 21308:  h. PYLORI GASTRITIS-Rx: Pylera. Simple adenoma and  hyperplastic polyp-TCS 10 yrs.      Kassie Mends, M.D.  Electronically Signed     SM/MEDQ  D:  09/07/2008  T:  09/08/2008  Job:  [id number]   cc:   Milus Mallick. Lodema Hong, M.D.  Fax: [phone]

## 2011-01-07 NOTE — Assessment & Plan Note (Signed)
NAME:  Desiree [name], Desiree [name]                CHART#:  [id number]   DATE:  10/05/2008                       DOB:  1946/05/10   REFERRING PHYSICIAN:  Milus Mallick. Lodema Hong, MD   PROBLEM LIST:  1. Dyspepsia secondary to Helicobacter pylori gastritis.  2. Simple adenomas on colonoscopy in October 2010.  3. Mild pancolonic diverticulosis.  4. Hypertension.   SUBJECTIVE:  The patient is a 65 year old female who presents as a  return patient visit.  She was last seen in January 2010 for her  procedure.  It showed the biopsies of her gastric mucosa showed H.  pylori organisms.  She was initially prescribed Pylera.  It was too  expensive.  She was prescribed metronidazole and tetracycline business  and asked to continue the Prilosec b.i.d.  With the tetracycline, she  began to feel like she was spaced out and had chills.  The medications  were discontinued.  She was asked to take sequential therapy with  amoxicillin and Biaxin.  She was started on amoxicillin to complete a 5  days.  She said with the initiation of the other antibiotics she had  itching in her neck and her hands.  She was also having 2 loose stools a  day on the amoxicillin.  She is now starting the Biaxin.  Her pain in  her stomach is improved, but not resolved.   MEDICATIONS:  1. Calcium.  2. Omeprazole b.i.d.  3. HCTZ.  4. Simvastatin.  5. Amlodipine.  6. Potassium.  7. Multivitamin.  8. Flexeril.  9. Biaxin.   OBJECTIVE:  VITAL SIGNS:  Weight 190 pounds (down 4 pounds since  December 2009), height 5 feet 1-1/2 inches, BMI 35.9 (obese, severe),  temperature 97.8, blood pressure 124/80, pulse 60.GENERAL:  She is in no  apparent distress.  Alert and oriented x4.LUNGS:  Clear to auscultation  bilaterally.CARDIOVASCULAR:  Regular rhythm.  No murmur.  Normal S1,  S2.ABDOMEN:  Bowel sounds are present, soft, nontender, nondistended.  No rebound or guarding.   ASSESSMENT:  The patient is a 65 year old female who has H. pylori  gastritis.  She was unable to tolerate metronidazole and tetracycline.  She seemed to be tolerating the amoxicillin better.  She is to start the  Biaxin.  I doubt that the itching in her hands and her neck are related  to an antibiotic use.  Thank you for allowing me to see the patient in  consultation.  My recommendations follow.   RECOMMENDATIONS:  1. She will begin the Biaxin.  She will add Sustenex once daily for      the next 6 days.  She is to continue the omeprazole twice a day      until the Biaxin is complete and then she will go to once a day.      She should continue the omeprazole indefinitely while taking anti-      inflammatory drugs.  2. She may restart her aspirin or ibuprofen on 10/09/2008.  She said      the ibuprofen really helped for her arthritis.  3. She should continue a high-fiber diet.  She has a follow up      appointment to see me in 6 months.  If her symptoms are doing      fairly well, then she may cancel that appointment and see  me in 10      years for her colonoscopy.       Kassie Mends, M.D.  Electronically Signed     SM/MEDQ  D:  10/05/2008  T:  10/05/2008  Job:  [id number]   cc:   Milus Mallick. Lodema Hong, M.D.

## 2011-01-10 NOTE — Op Note (Signed)
Desiree [name], Desiree [name]               ACCOUNT NO.:  [id number]   MEDICAL RECORD NO.:  [id number]          PATIENT TYPE:  AMB   LOCATION:  DSC                          FACILITY:  [facility]   PHYSICIAN:  Rose Phi. Young, M.D.   DATE OF BIRTH:  06/10/1946   DATE OF PROCEDURE:  11/11/2004  DATE OF DISCHARGE:                                 OPERATIVE REPORT   PREOPERATIVE DIAGNOSIS:  Ductal carcinoma in situ of the right breast.   POSTOPERATIVE DIAGNOSIS:  Ductal carcinoma in situ of the right breast.   OPERATION:  Right partial mastectomy with needle localization and specimen  mammogram.   SURGEON:  Rose Phi. Maple Hudson, M.D.   ANESTHESIA:  General.   DESCRIPTION OF PROCEDURE:  Prior to coming into the operating room, a  localizing wire had been placed near the microcalcifications in the 12  o'clock position of the right breast which had been previously biopsied and  demonstrated DCIS.   After suitable general anesthesia was induced, the patient was placed in the  supine position with the arms extended on the arm board.  A curved incision  centered in the area of where the wire had been placed was then outlined and  then the area incised and the wire delivered into the middle of the incision  and then a wide excision of the wire and surrounding tissue was carried out.   The specimen was oriented for the pathologist and submitted for specimen  mammogram which confirmed the removal of the calcifications.   With good hemostasis obtained by the cautery, we thoroughly infiltrated the  incision with 0.25% Marcaine.   The incision was closed in two layers with 3-0 Vicryl and a subcuticular 4-0  Monocryl and Steri-Strips.   Dressings were then applied and the patient transferred to the recovery room  in satisfactory condition, having tolerated the procedure well.      PRY/MEDQ  D:  11/11/2004  T:  11/11/2004  Job:  [id number]

## 2011-01-10 NOTE — Op Note (Signed)
Desiree [name], KINNAIRD NO.:  [id number]   MEDICAL RECORD NO.:  [id number]          PATIENT TYPE:  OIB   LOCATION:  3022                         FACILITY:  [facility]   PHYSICIAN:  Danae Orleans. Venetia Maxon, M.D.  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   OPERATION PERFORMED:  Left C6-7 sitting METRx microdiskectomy with  microdissection.   SURGEON:  Danae Orleans. Venetia Maxon, M.D.   ASSISTANT:  Cristi Loron, M.D.   ANESTHESIA:  General endotracheal.   ESTIMATED BLOOD LOSS:  50 mL.   COMPLICATIONS:  None.   DISPOSITION:  Recovery.   INDICATIONS FOR PROCEDURE:  Desiree [name] is a 65 year old woman with four-  level cervical degenerative disk disease and spondylosis.  She has a lateral  disk herniation at C6-7 on the left and it was felt that rather than perform  a multilevel anterior surgical procedure.  It would benefit the patient to  perform a posterior decompression of the C7 nerve root with microdiskectomy.   DESCRIPTION OF PROCEDURE:  The patient was brought to the operating room.  Following satisfactory and uncomplicated induction of general endotracheal  anesthesia and placement of a central line, the patient was placed in three-  pin head fixation and was placed in a sitting position on the operating  table using a Mayfield headholder and initially she was positioned with her  neck in a firm cervical collar.  Subsequently, her posterior neck was then  prepped and draped in the usual sterile fashion after fluoroscopy was  utilized to confirm visualization at the C6-7 level.  The METRx retractor  device was used and one fingerbreadth lateral to the midline at the C6-7  level, incision was made and carried to the posterior cervical fascia using  sequential dilation with radiographic confirmation.  An 18 mm x 4 cm tubular  retractor was placed and the overlying muscle was removed with Bovie and  pituitary rongeur.  The lateral aspect of the C6-7 level was identified and  using a high speed drill, a key hole foraminotomy was performed.  The  overlying bone was removed and a generous foraminotomy was performed  overlying the C7 nerve root.  The overlying ligamentous tissue was removed  and the lateral spinal cord dura was identified as was the C7 nerve root as  it extended out the neural foramen.  Under microscopic visualization, the  nerve root was mobilized superiorly and a large amount of herniated disk  material was removed.  Multiple fragments of disk material was removed from  within the axilla of the nerve root with significant decompression of the  nerve root sleeve and the nerve root.  Upon removing these multiple  fragments of disk, the nerve root appeared to take a more normal course, did  not  appear to be nearly as tented and became pulsatile.  Hemostasis was  assured and the wound was copiously irrigated with bacitracin saline.  The  METRx retractor was removed.  The posterior cervical fascia was  reapproximated with 2-0 Vicryl sutures and the skin edges were  reapproximated with 2-0 Vicryl interrupted inverted sutures.  The wound was  dressed with Dermabond.  The patient was taken out of the three-pin Mayfield  headholder fixation and taken to the recovery room having tolerated the  operation well.      Danae Orleans. Venetia Maxon, M.D.  Electronically Signed     JDS/MEDQ  D:  12/12/2005  T:  12/15/2005  Job:  [id number]

## 2011-01-16 ENCOUNTER — Ambulatory Visit: Payer: PRIVATE HEALTH INSURANCE | Admitting: Family Medicine

## 2011-01-16 ENCOUNTER — Telehealth: Payer: Self-pay | Admitting: Family Medicine

## 2011-01-16 NOTE — Telephone Encounter (Signed)
Patient aware.

## 2011-01-16 NOTE — Telephone Encounter (Signed)
Advise tylenol, she may ask at her pharmacy aboput any oTC ear drops forear pain, if worsens will need to go to urgent care

## 2011-01-17 ENCOUNTER — Emergency Department (HOSPITAL_COMMUNITY)
Admission: EM | Admit: 2011-01-17 | Discharge: 2011-01-17 | Disposition: A | Discharge status: Home / Self Care | Payer: PRIVATE HEALTH INSURANCE | Attending: Emergency Medicine | Admitting: Emergency Medicine

## 2011-01-17 ENCOUNTER — Emergency Department (HOSPITAL_COMMUNITY): Payer: PRIVATE HEALTH INSURANCE

## 2011-01-17 DIAGNOSIS — Z853 Personal history of malignant neoplasm of breast: Secondary | ICD-10-CM | POA: Insufficient documentation

## 2011-01-17 DIAGNOSIS — Z7982 Long term (current) use of aspirin: Secondary | ICD-10-CM | POA: Insufficient documentation

## 2011-01-17 DIAGNOSIS — Z79899 Other long term (current) drug therapy: Secondary | ICD-10-CM | POA: Insufficient documentation

## 2011-01-17 DIAGNOSIS — E7800 Pure hypercholesterolemia, unspecified: Secondary | ICD-10-CM | POA: Insufficient documentation

## 2011-01-17 DIAGNOSIS — H9209 Otalgia, unspecified ear: Secondary | ICD-10-CM | POA: Insufficient documentation

## 2011-01-17 DIAGNOSIS — R51 Headache: Secondary | ICD-10-CM | POA: Insufficient documentation

## 2011-01-17 DIAGNOSIS — I10 Essential (primary) hypertension: Secondary | ICD-10-CM | POA: Insufficient documentation

## 2011-01-21 ENCOUNTER — Other Ambulatory Visit: Payer: Self-pay | Admitting: Family Medicine

## 2011-01-21 ENCOUNTER — Encounter: Payer: Self-pay | Admitting: Family Medicine

## 2011-01-21 ENCOUNTER — Ambulatory Visit (INDEPENDENT_AMBULATORY_CARE_PROVIDER_SITE_OTHER): Payer: PRIVATE HEALTH INSURANCE | Admitting: Family Medicine

## 2011-01-21 VITALS — BP 118/84 | HR 74 | Temp 98.8°F | Resp 16 | Ht 62.0 in | Wt 190.1 lb

## 2011-01-21 DIAGNOSIS — I10 Essential (primary) hypertension: Secondary | ICD-10-CM

## 2011-01-21 DIAGNOSIS — E785 Hyperlipidemia, unspecified: Secondary | ICD-10-CM

## 2011-01-21 DIAGNOSIS — R51 Headache: Secondary | ICD-10-CM

## 2011-01-21 DIAGNOSIS — R519 Headache, unspecified: Secondary | ICD-10-CM | POA: Insufficient documentation

## 2011-01-21 MED ORDER — METHYLPREDNISOLONE ACETATE 80 MG/ML IJ SUSP
80.0000 mg | Freq: Once | INTRAMUSCULAR | Status: AC
  Administered 2011-01-21: 80 mg via INTRAMUSCULAR

## 2011-01-21 MED ORDER — KETOROLAC TROMETHAMINE 60 MG/2ML IM SOLN
60.0000 mg | Freq: Once | INTRAMUSCULAR | Status: AC
  Administered 2011-01-21: 60 mg via INTRAMUSCULAR

## 2011-01-21 MED ORDER — PREDNISONE (PAK) 5 MG PO TABS: ORAL_TABLET | ORAL | Status: DC

## 2011-01-21 NOTE — Assessment & Plan Note (Signed)
Controlled, no change in medication  

## 2011-01-21 NOTE — Progress Notes (Signed)
  Subjective:    Patient ID: Desiree [name], female    DOB: Aug 03, 1946, 65 y.o.   MRN: [id number]  HPI Pt had a headache rated at a ten,  5 days ago, hurt to rest her head on affected side, right, half of face to neck, throbbing mainly temporal, not aggravated by direct pressure, internittently as though punched. CTscan was negative. Has had headache in the past but not like this. No personl or family h/o migraines. Took med from ED twice , but since it did not help an it had tylenol , she reverted to 100mg  tylenol daily for the past seveeral days, pain is down from a 10plus to a 7. No aura, and no motor or sensory loss associated with the headache The week before she had head congestion with white pharyngeal exudate, this cleared with OTC medication , but she is terrified since a teenager in the family, reportedly had similar symptoms and died in the Spring, apparently had meningitis.        Review of Systems See HPI. Marland Kitchen Denies chest congestion, productive cough or wheezing. Denies chest pains, palpitations, paroxysmal nocturnal dyspnea, orthopnea and leg swelling Denies abdominal pain, nausea, vomiting,diarrhea or constipation.  Denies rectal bleeding or change in bowel movement. Denies dysuria, frequency, hesitancy or incontinence. Denies joint pain, swelling and limitation in mobility. Denies seizure, numbness, or tingling. Denies depression, or insomnia. Denies skin break down or rash.        Objective:   Physical Exam Patient alert and oriented and in no Cardiopulmonary distress.  HEENT: No facial asymmetry, EOMI, no sinus tenderness, TM's clear, Oropharynx pink and moist.  Neck supple no adenopathy. Fundoscopy: no exudate or  hemorhage  Chest: Clear to auscultation bilaterally.  CVS: S1, S2 no murmurs, no S3.  ABD: Soft non tender. Bowel sounds normal.  Ext: No edema  MS: Adequate ROM spine, shoulders, hips and knees.  Skin: Intact, no ulcerations or rash  noted.  Psych: Good eye contact, normal affect. Memory intact not anxious or depressed appearing.  CNS: CN 2-12 intact, power, tone and sensation normal throughout.       Assessment & Plan:

## 2011-01-21 NOTE — Assessment & Plan Note (Signed)
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 Neurology to order imaging study. Depomedrol administered in office

## 2011-01-21 NOTE — Patient Instructions (Signed)
F/U as before.  Injections today for headache.Medication also sent to your pharmacy to start tomorrow  Pls stop full strength aspirin and take low dose aspirin while on prednisone which you will start tomorrow  You are being treated for presumed migraine headache , however since new headache and severe, you are also being referred to neurology.  If headache perssits or worsens in the nexr week pls call back

## 2011-02-10 ENCOUNTER — Other Ambulatory Visit: Payer: Self-pay | Admitting: Family Medicine

## 2011-02-21 ENCOUNTER — Telehealth: Payer: Self-pay | Admitting: Family Medicine

## 2011-02-21 MED ORDER — POTASSIUM CHLORIDE CRYS ER 20 MEQ PO TBCR: 20.0000 meq | EXTENDED_RELEASE_TABLET | Freq: Two times a day (BID) | ORAL | Status: DC

## 2011-02-21 NOTE — Telephone Encounter (Signed)
meds sent as requested 

## 2011-02-24 ENCOUNTER — Telehealth: Payer: Self-pay | Admitting: Family Medicine

## 2011-02-24 MED ORDER — POTASSIUM CHLORIDE CRYS ER 20 MEQ PO TBCR: 20.0000 meq | EXTENDED_RELEASE_TABLET | Freq: Every day | ORAL | Status: DC

## 2011-02-24 NOTE — Telephone Encounter (Signed)
Said when she picked up her potassium it said bid and she had been taking it once daily. Checked in chart and dr simpson had not changed any meds at the last OV so I told her to take it like she has been and I would change it back in the chart

## 2011-03-05 ENCOUNTER — Encounter: Payer: Self-pay | Admitting: Family Medicine

## 2011-03-08 ENCOUNTER — Other Ambulatory Visit: Payer: Self-pay | Admitting: Family Medicine

## 2011-03-08 LAB — LIPID PANEL
LDL Cholesterol: 81 mg/dL (ref 0–99)
Total CHOL/HDL Ratio: 2 Ratio
Triglycerides: 35 mg/dL (ref <150)
VLDL: 7 mg/dL (ref 0–40)

## 2011-03-08 LAB — CBC WITH DIFFERENTIAL/PLATELET
Basophils Absolute: 0 10*3/uL (ref 0.0–0.1)
HCT: 36 % (ref 36.0–46.0)
Hemoglobin: 11.6 g/dL — ABNORMAL LOW (ref 12.0–15.0)
Lymphocytes Relative: 27 % (ref 12–46)
Monocytes Absolute: 0.2 10*3/uL (ref 0.1–1.0)
Monocytes Relative: 7 % (ref 3–12)
Neutro Abs: 2.2 10*3/uL (ref 1.7–7.7)
RDW: 15.3 % (ref 11.5–15.5)
WBC: 3.5 10*3/uL — ABNORMAL LOW (ref 4.0–10.5)

## 2011-03-08 LAB — BASIC METABOLIC PANEL
BUN: 13 mg/dL (ref 6–23)
Chloride: 102 mEq/L (ref 96–112)
Potassium: 3.6 mEq/L (ref 3.5–5.3)
Sodium: 140 mEq/L (ref 135–145)

## 2011-03-08 LAB — HEPATIC FUNCTION PANEL
Albumin: 3.8 g/dL (ref 3.5–5.2)
Indirect Bilirubin: 0.4 mg/dL (ref 0.0–0.9)
Total Protein: 6.9 g/dL (ref 6.0–8.3)

## 2011-03-08 LAB — HEMOGLOBIN A1C
Hgb A1c MFr Bld: 5.7 % — ABNORMAL HIGH (ref <5.7)
Mean Plasma Glucose: 117 mg/dL — ABNORMAL HIGH (ref <117)

## 2011-03-12 ENCOUNTER — Encounter: Payer: Self-pay | Admitting: Family Medicine

## 2011-03-12 ENCOUNTER — Ambulatory Visit (INDEPENDENT_AMBULATORY_CARE_PROVIDER_SITE_OTHER): Payer: PRIVATE HEALTH INSURANCE | Admitting: Family Medicine

## 2011-03-12 VITALS — BP 122/80 | HR 58 | Resp 16 | Ht 61.5 in | Wt 189.8 lb

## 2011-03-12 DIAGNOSIS — M25559 Pain in unspecified hip: Secondary | ICD-10-CM

## 2011-03-12 DIAGNOSIS — E785 Hyperlipidemia, unspecified: Secondary | ICD-10-CM

## 2011-03-12 DIAGNOSIS — M549 Dorsalgia, unspecified: Secondary | ICD-10-CM

## 2011-03-12 DIAGNOSIS — R7301 Impaired fasting glucose: Secondary | ICD-10-CM

## 2011-03-12 DIAGNOSIS — R51 Headache: Secondary | ICD-10-CM

## 2011-03-12 DIAGNOSIS — I10 Essential (primary) hypertension: Secondary | ICD-10-CM

## 2011-03-12 MED ORDER — AMLODIPINE BESYLATE 10 MG PO TABS: 10.0000 mg | ORAL_TABLET | Freq: Every day | ORAL | Status: DC

## 2011-03-12 MED ORDER — ACETAMINOPHEN 500 MG PO TABS: ORAL_TABLET | ORAL | Status: DC

## 2011-03-12 NOTE — Patient Instructions (Addendum)
F/u in  4 months, ned a rectal exam at that visit.  Fasting chem 7 , and hBA1c  In 4 months. I am happy that you feel better.  Labs are good.  No med changes.   It is important that you exercise regularly at least 30 minutes 5 times a week. If you develop chest pain, have severe difficulty breathing, or feel very tired, stop exercising immediately and seek medical attention    A healthy diet is rich in fruit, vegetables and whole grains. Poultry fish, nuts and beans are a healthy choice for protein rather then red meat. A low sodium diet and drinking 64 ounces of water daily is generally recommended. Oils and sweet should be limited. Carbohydrates especially for those who are diabetic or overweight, should be limited to 34-45 gram per meal. It is important to eat on a regular schedule, at least 3 times daily. Snacks should be primarily fruits, vegetables or nuts.   Weight loss goal is 2 pounds each month

## 2011-03-16 ENCOUNTER — Encounter: Payer: Self-pay | Admitting: Family Medicine

## 2011-03-16 NOTE — Assessment & Plan Note (Signed)
Deteriorated, pt to [facility] on as needed basis

## 2011-03-16 NOTE — Progress Notes (Signed)
  Subjective:    Patient ID: Desiree [name], female    DOB: 08-28-1945, 65 y.o.   MRN: [id number]  HPI The PT is here for follow up and re-evaluation of chronic medical conditions, medication management and review of recent lab and radiology data.  Preventive health is updated, specifically  Cancer screening, Osteoporosis screening and Immunization.   Questions or concerns regarding consultations or procedures which the PT has had in the interim are  addressed. The PT denies any adverse reactions to current medications since the last visit.       Review of Systems Denies recent fever or chills. Denies sinus pressure, nasal congestion, ear pain or sore throat. Denies chest congestion, productive cough or wheezing. Denies chest pains, palpitations, paroxysmal nocturnal dyspnea, orthopnea and leg swelling Denies abdominal pain, nausea, vomiting,diarrhea or constipation.  Denies rectal bleeding or change in bowel movement. Denies dysuria, frequency, hesitancy or incontinence. C/o back and hip  pain,  With limitation in mobility. Denies headaches, seizure, numbness, or tingling. Denies depression, anxiety or insomnia. Denies skin break down or rash.        Objective:   Physical Exam Patient alert and oriented and in no Cardiopulmonary distress.  HEENT: No facial asymmetry, EOMI, no sinus tenderness, TM's clear, Oropharynx pink and moist.  Neck supple no adenopathy.  Chest: Clear to auscultation bilaterally.  CVS: S1, S2 no murmurs, no S3.  ABD: Soft non tender. Bowel sounds normal.  Ext: No edema  MS: decreased  ROM spine, hips and knees.  Skin: Intact, no ulcerations or rash noted.  Psych: Good eye contact, normal affect. Memory intact not anxious or depressed appearing.  CNS: CN 2-12 intact, power, tone and sensation normal throughout.        Assessment & Plan:

## 2011-03-16 NOTE — Assessment & Plan Note (Signed)
Controlled, no change in medication  

## 2011-03-16 NOTE — Assessment & Plan Note (Signed)
Resolved, pt was evaluated by neurology also

## 2011-05-19 LAB — CREATININE, SERUM
Creatinine, Ser: 1.1
GFR calc Af Amer: >60
GFR calc non Af Amer: 50 — ABNORMAL LOW

## 2011-06-23 ENCOUNTER — Other Ambulatory Visit: Payer: Self-pay | Admitting: Family Medicine

## 2011-06-25 ENCOUNTER — Other Ambulatory Visit: Payer: Self-pay | Admitting: Family Medicine

## 2011-06-30 ENCOUNTER — Encounter: Payer: Self-pay | Admitting: Family Medicine

## 2011-06-30 ENCOUNTER — Ambulatory Visit (INDEPENDENT_AMBULATORY_CARE_PROVIDER_SITE_OTHER): Payer: PRIVATE HEALTH INSURANCE | Admitting: Family Medicine

## 2011-06-30 VITALS — BP 114/80 | HR 60 | Resp 16 | Ht 61.5 in | Wt 189.0 lb

## 2011-06-30 DIAGNOSIS — M549 Dorsalgia, unspecified: Secondary | ICD-10-CM

## 2011-06-30 MED ORDER — MELOXICAM 7.5 MG PO TABS: 7.5000 mg | ORAL_TABLET | Freq: Every day | ORAL | Status: DC

## 2011-06-30 MED ORDER — TRAMADOL HCL 50 MG PO TABS: 50.0000 mg | ORAL_TABLET | Freq: Two times a day (BID) | ORAL | Status: DC | PRN

## 2011-06-30 NOTE — Progress Notes (Signed)
  Subjective:    Patient ID: Desiree [name], female    DOB: 1945-09-28, 65 y.o.   MRN: [id number]  HPI Saturday patient noticed of itching sensation in the middle of her back near her left shoulder blade. Initially she thought it was her eczema therefore she used a cream on it. The pain progressed to a sharp pain which is been persistent since that time. She denies any rash to the area. She denies any specific injury. She denies any previous back history. With certain movements she feels pain near her left shoulder blade, she tried her Flexeril which did not help. She initially thought the area was swollen.   Review of Systems  No change in bowel or bladder, no radicular symptoms, no parethesia in upper or lower ext, no weakness    Objective:   Physical Exam  Gen- NAD, alert and oriented Neck- FROM [facility]- Mild weakness in left rotator cuff with ROM compared to Right, normal inspection of upper arm and shoulder, no winged scapula noted, biceps in tact  Spine- TTP at thoracic spine,TTP along medial border of scapula, cervical spine and lumbar spine non tender Neuro- no focal deficits, sensation in tact        Assessment & Plan:

## 2011-06-30 NOTE — Assessment & Plan Note (Signed)
Patient has more thoracic back pain leading to her shoulder area. Although she has some mild weakness on exam I think that is secondary to her discomfort versus suture rotator cuff injury. At this time we will treat her with a weeks worth of anti-inflammatories as she is on full dose aspirin. Will add Ultram as needed for pain especially at bedtime. She continues to have pain will warrent further workup. I do not think imaging is needed at this time.

## 2011-06-30 NOTE — Patient Instructions (Signed)
I think this is musculoskeletal pain- Start taking the Mobic once a day with meals for the next week  Use the ultram at bedtime at least for pain, you can take it twice a day Use your shoulder and arm as much as you can If this does not improve then please let us know.

## 2011-07-03 ENCOUNTER — Telehealth: Payer: Self-pay | Admitting: Family Medicine

## 2011-07-03 NOTE — Telephone Encounter (Signed)
Called pt left message .

## 2011-07-09 ENCOUNTER — Encounter: Payer: Self-pay | Admitting: Family Medicine

## 2011-07-12 LAB — BASIC METABOLIC PANEL
CO2: 31 mEq/L (ref 19–32)
Calcium: 9.7 mg/dL (ref 8.4–10.5)
Creat: 1.02 mg/dL (ref 0.50–1.10)
Sodium: 141 mEq/L (ref 135–145)

## 2011-07-12 LAB — HEMOGLOBIN A1C
Hgb A1c MFr Bld: 5.2 % (ref <5.7)
Mean Plasma Glucose: 103 mg/dL (ref <117)

## 2011-07-14 ENCOUNTER — Ambulatory Visit (INDEPENDENT_AMBULATORY_CARE_PROVIDER_SITE_OTHER): Payer: PRIVATE HEALTH INSURANCE | Admitting: Family Medicine

## 2011-07-14 ENCOUNTER — Encounter: Payer: Self-pay | Admitting: Family Medicine

## 2011-07-14 VITALS — BP 126/64 | HR 78 | Resp 16 | Ht 61.5 in | Wt 192.0 lb

## 2011-07-14 DIAGNOSIS — R7301 Impaired fasting glucose: Secondary | ICD-10-CM

## 2011-07-14 DIAGNOSIS — E663 Overweight: Secondary | ICD-10-CM

## 2011-07-14 DIAGNOSIS — I10 Essential (primary) hypertension: Secondary | ICD-10-CM

## 2011-07-14 DIAGNOSIS — E785 Hyperlipidemia, unspecified: Secondary | ICD-10-CM

## 2011-07-14 DIAGNOSIS — Z23 Encounter for immunization: Secondary | ICD-10-CM

## 2011-07-14 MED ORDER — HYDROCHLOROTHIAZIDE 25 MG PO TABS: ORAL_TABLET | ORAL | Status: DC

## 2011-07-14 MED ORDER — LOVASTATIN 40 MG PO TABS: ORAL_TABLET | ORAL | Status: DC

## 2011-07-14 MED ORDER — AMLODIPINE BESYLATE 10 MG PO TABS: 10.0000 mg | ORAL_TABLET | Freq: Every day | ORAL | Status: DC

## 2011-07-14 NOTE — Progress Notes (Signed)
  Subjective:    Patient ID: Desiree [name], female    DOB: 02/12/1946, 65 y.o.   MRN: [id number]  HPI Pt in for f/u from visit approx 1 month ago, when she was treated  For left post chest pain. Pt reports area  involved became swollen and she had a rash break out , thought it was an allergic reactionto the pain med she got, but I advised a local reaction would not be caused by med taken orally.States she started using med for excema on the affected area and this helped the rash a lot. She is also here to review labs which have greaty ly improved, blood sugar average is now normal   Review of Systems See HPI Denies recent fever or chills. Denies sinus pressure, nasal congestion, ear pain or sore throat. Denies chest congestion, productive cough or wheezing. Denies chest pains, palpitations and leg swelling Denies abdominal pain, nausea, vomiting,diarrhea or constipation.   Denies dysuria, frequency, hesitancy or incontinence. Denies joint pain, swelling and limitation in mobility. Denies headaches, seizures, numbness, or tingling. Denies depression, anxiety or insomnia.         Objective:   Physical Exam Patient alert and oriented and in no cardiopulmonary distress.  HEENT: No facial asymmetry, EOMI, no sinus tenderness,  oropharynx pink and moist.  Neck supple no adenopathy.  Chest: Clear to auscultation bilaterally.  CVS: S1, S2 no murmurs, no S3.  ABD: Soft non tender. Bowel sounds normal.  Ext: No edema  MS: Adequate ROM spine, shoulders, hips and knees.  Skin: Intact, maculopapular rash on left upper chest posteriorly  Psych: Good eye contact, normal affect. Memory intact not anxious or depressed appearing.  CNS: CN 2-12 intact, power, tone and sensation normal throughout.        Assessment & Plan:

## 2011-07-14 NOTE — Assessment & Plan Note (Signed)
Controlled, no change in medication Hyperlipidemia:Low fat diet discussed and encouraged.  \ 

## 2011-07-14 NOTE — Assessment & Plan Note (Signed)
unchanged Patient re-educated about  the importance of commitment to a  minimum of 150 minutes of exercise per week. The importance of healthy food choices with portion control discussed. Encouraged to start a food diary, count calories and to consider  joining a support group. Sample diet sheets offered. Goals set by the patient for the next several months.    

## 2011-07-14 NOTE — Assessment & Plan Note (Signed)
Improved HBA1C now normal, which is great

## 2011-07-14 NOTE — Assessment & Plan Note (Signed)
Controlled, no change in medication  

## 2011-07-14 NOTE — Patient Instructions (Signed)
F/u end March  Please call if you need me before.  Blood pressure and cholesterol are great, no med changes at this time.   Potassium stays at ONE daily, we will also notify the pahrmacy  TdAP and flu vaccine today.  Fasting lipid, cmp HBa1C  In March  Blood sugars have normalized, congrats, keep it up   Weight loss, regular exercise, and a diet rich in fruit and veg, fresh or frozen will improve your blood pressure and your health

## 2011-07-15 DIAGNOSIS — Z23 Encounter for immunization: Secondary | ICD-10-CM

## 2011-07-15 NOTE — Progress Notes (Signed)
Addended by: Abner Greenspan on: 07/15/2011 05:12 PM   Modules accepted: Orders

## 2011-07-15 NOTE — Telephone Encounter (Signed)
Patient was seen in office yesterday 

## 2011-10-30 ENCOUNTER — Other Ambulatory Visit: Payer: Self-pay | Admitting: Family Medicine

## 2011-11-08 LAB — LIPID PANEL
Cholesterol: 164 mg/dL (ref 0–200)
Triglycerides: 44 mg/dL (ref <150)

## 2011-11-09 LAB — COMPLETE METABOLIC PANEL WITH GFR
AST: 30 U/L (ref 0–37)
Albumin: 3.9 g/dL (ref 3.5–5.2)
BUN: 19 mg/dL (ref 6–23)
CO2: 29 mEq/L (ref 19–32)
Calcium: 9.4 mg/dL (ref 8.4–10.5)
Chloride: 103 mEq/L (ref 96–112)
Creat: 0.97 mg/dL (ref 0.50–1.10)
GFR, Est African American: 71 mL/min
Glucose, Bld: 98 mg/dL (ref 70–99)
Potassium: 3.9 mEq/L (ref 3.5–5.3)

## 2011-11-09 LAB — HEMOGLOBIN A1C
Hgb A1c MFr Bld: 5.5 % (ref <5.7)
Mean Plasma Glucose: 111 mg/dL (ref <117)

## 2011-11-11 ENCOUNTER — Encounter: Payer: Self-pay | Admitting: Family Medicine

## 2011-11-11 ENCOUNTER — Ambulatory Visit (INDEPENDENT_AMBULATORY_CARE_PROVIDER_SITE_OTHER): Payer: MEDICARE | Admitting: Family Medicine

## 2011-11-11 ENCOUNTER — Other Ambulatory Visit: Payer: Self-pay | Admitting: Family Medicine

## 2011-11-11 VITALS — BP 160/90 | HR 70 | Resp 18 | Ht 61.5 in | Wt 192.1 lb

## 2011-11-11 DIAGNOSIS — R7301 Impaired fasting glucose: Secondary | ICD-10-CM

## 2011-11-11 DIAGNOSIS — R5383 Other fatigue: Secondary | ICD-10-CM

## 2011-11-11 DIAGNOSIS — E785 Hyperlipidemia, unspecified: Secondary | ICD-10-CM

## 2011-11-11 DIAGNOSIS — I10 Essential (primary) hypertension: Secondary | ICD-10-CM

## 2011-11-11 DIAGNOSIS — Z9889 Other specified postprocedural states: Secondary | ICD-10-CM

## 2011-11-11 DIAGNOSIS — R5381 Other malaise: Secondary | ICD-10-CM

## 2011-11-11 DIAGNOSIS — E663 Overweight: Secondary | ICD-10-CM

## 2011-11-11 DIAGNOSIS — Z23 Encounter for immunization: Secondary | ICD-10-CM

## 2011-11-11 MED ORDER — TRIAMCINOLONE ACETONIDE 0.05 % EX OINT: TOPICAL_OINTMENT | CUTANEOUS | Status: DC

## 2011-11-11 NOTE — Assessment & Plan Note (Addendum)
Uncontrolled, pt declining increase in medication at this time, states stressed due to recent news of critical illness in her nephew

## 2011-11-11 NOTE — Patient Instructions (Signed)
F/u in 45month.  Blood pressure is too high.   Lifestyle change as discussed is necessary. oK to do BP check every 2 weeks, if you are remaining over 150/90, call for sooner appt

## 2011-11-11 NOTE — Progress Notes (Signed)
  Subjective:    Patient ID: Desiree [name], female    DOB: [birth year], 66 y.o.   MRN: [id number]  HPI The PT is here for follow up and re-evaluation of chronic medical conditions, medication management and review of any available recent lab and radiology data.  Preventive health is updated, specifically  Cancer screening and Immunization.   Questions or concerns regarding consultations or procedures which the PT has had in the interim are  addressed. The PT denies any adverse reactions to current medications since the last visit.  There are no new concerns. Has increased anxiety at visit due to recent message on ill health of her nephew.       Review of Systems See HPI Denies recent fever or chills. Denies sinus pressure, nasal congestion, ear pain or sore throat. Denies chest congestion, productive cough or wheezing. Denies chest pains, palpitations and leg swelling Denies abdominal pain, nausea, vomiting,diarrhea or constipation.   Denies dysuria, frequency, hesitancy or incontinence. Denies joint pain, swelling and limitation in mobility. Denies headaches, seizures, numbness, or tingling.  Denies skin break down or rash.        Objective:   Physical Exam Patient alert and oriented and in no cardiopulmonary distress.  HEENT: No facial asymmetry, EOMI, no sinus tenderness,  oropharynx pink and moist.  Neck supple no adenopathy.  Chest: Clear to auscultation bilaterally.  CVS: S1, S2 no murmurs, no S3.  ABD: Soft non tender. Bowel sounds normal.  Ext: No edema  MS: Adequate ROM spine, shoulders, hips and knees.  Skin: Intact, no ulcerations or rash noted.  Psych: Good eye contact, normal affect. Memory intact not anxious or depressed appearing.  CNS: CN 2-12 intact, power, tone and sensation normal throughout.        Assessment & Plan:

## 2011-11-12 NOTE — Assessment & Plan Note (Signed)
Controlled, no change in medication  

## 2011-11-12 NOTE — Assessment & Plan Note (Signed)
hBA1C has increased since last check, however still normal, pt encouraged to start counting carbs for better sugar control

## 2011-12-10 ENCOUNTER — Ambulatory Visit (HOSPITAL_COMMUNITY)
Admission: RE | Admit: 2011-12-10 | Discharge: 2011-12-10 | Disposition: A | Discharge status: Home / Self Care | Payer: Medicare Other | Source: Ambulatory Visit | Attending: Family Medicine | Admitting: Family Medicine

## 2011-12-10 DIAGNOSIS — Z9889 Other specified postprocedural states: Secondary | ICD-10-CM | POA: Insufficient documentation

## 2011-12-10 DIAGNOSIS — Z853 Personal history of malignant neoplasm of breast: Secondary | ICD-10-CM | POA: Insufficient documentation

## 2012-01-10 ENCOUNTER — Other Ambulatory Visit: Payer: Self-pay | Admitting: Family Medicine

## 2012-02-11 ENCOUNTER — Ambulatory Visit: Payer: MEDICARE | Admitting: Family Medicine

## 2012-02-13 ENCOUNTER — Ambulatory Visit (INDEPENDENT_AMBULATORY_CARE_PROVIDER_SITE_OTHER): Payer: Medicare Other | Admitting: Urology

## 2012-02-13 DIAGNOSIS — N3946 Mixed incontinence: Secondary | ICD-10-CM

## 2012-02-13 DIAGNOSIS — N8111 Cystocele, midline: Secondary | ICD-10-CM

## 2012-02-25 LAB — COMPREHENSIVE METABOLIC PANEL
ALT: 15 U/L (ref 0–35)
AST: 29 U/L (ref 0–37)
CO2: 30 mEq/L (ref 19–32)
Calcium: 9.4 mg/dL (ref 8.4–10.5)
Chloride: 99 mEq/L (ref 96–112)
Creat: 0.95 mg/dL (ref 0.50–1.10)
Sodium: 140 mEq/L (ref 135–145)
Total Bilirubin: 0.7 mg/dL (ref 0.3–1.2)
Total Protein: 6.9 g/dL (ref 6.0–8.3)

## 2012-02-25 LAB — TSH: TSH: 2.824 u[IU]/mL (ref 0.350–4.500)

## 2012-02-25 LAB — LIPID PANEL
HDL: 67 mg/dL (ref >39)
LDL Cholesterol: 83 mg/dL (ref 0–99)

## 2012-03-02 ENCOUNTER — Ambulatory Visit (INDEPENDENT_AMBULATORY_CARE_PROVIDER_SITE_OTHER): Payer: Medicare Other | Admitting: Family Medicine

## 2012-03-02 ENCOUNTER — Encounter: Payer: Self-pay | Admitting: Family Medicine

## 2012-03-02 VITALS — BP 122/76 | HR 70 | Resp 18 | Ht 61.5 in | Wt 174.1 lb

## 2012-03-02 DIAGNOSIS — E663 Overweight: Secondary | ICD-10-CM

## 2012-03-02 DIAGNOSIS — E785 Hyperlipidemia, unspecified: Secondary | ICD-10-CM

## 2012-03-02 DIAGNOSIS — I10 Essential (primary) hypertension: Secondary | ICD-10-CM

## 2012-03-02 DIAGNOSIS — J309 Allergic rhinitis, unspecified: Secondary | ICD-10-CM

## 2012-03-02 DIAGNOSIS — R7301 Impaired fasting glucose: Secondary | ICD-10-CM

## 2012-03-02 MED ORDER — CARVEDILOL 3.125 MG PO TABS: 3.1250 mg | ORAL_TABLET | Freq: Two times a day (BID) | ORAL | Status: DC

## 2012-03-02 MED ORDER — LORATADINE 10 MG PO TABS: 10.0000 mg | ORAL_TABLET | Freq: Every day | ORAL | Status: DC

## 2012-03-02 MED ORDER — LOVASTATIN 40 MG PO TABS: ORAL_TABLET | ORAL | Status: DC

## 2012-03-02 MED ORDER — HYDROCHLOROTHIAZIDE 25 MG PO TABS: ORAL_TABLET | ORAL | Status: DC

## 2012-03-02 NOTE — Assessment & Plan Note (Signed)
Improved. Pt applauded on succesful weight loss through lifestyle change, and encouraged to continue same. Weight loss goal set for the next several months.  

## 2012-03-02 NOTE — Patient Instructions (Addendum)
F/u in November.Call if you need me before.  OK to call in October for flu vaccine  CONGRATULATIONS on succesful lifestyle change with excellent weight l;oss, and excellent labs.  New medication for allergies, claritin, you will be given the script. oK to use sudafed one daily as needed for excessive head pressure and drainage  Continue all medication you are currently taking  HBA1C in 4 months

## 2012-03-02 NOTE — Assessment & Plan Note (Signed)
Controlled, no change in medication  

## 2012-03-02 NOTE — Assessment & Plan Note (Signed)
Uncontrolled, add new medication

## 2012-03-02 NOTE — Assessment & Plan Note (Signed)
Hyperlipidemia:Low fat diet discussed and encouraged.  Controlled, no change in medication   

## 2012-03-02 NOTE — Assessment & Plan Note (Signed)
Improved with lifestyle change rept lab in 4 months

## 2012-03-21 NOTE — Progress Notes (Signed)
  Subjective:    Patient ID: Desiree [name], female    DOB: [birth year], 66 y.o.   MRN: [id number]  HPI The PT is here for follow up and re-evaluation of chronic medical conditions, medication management and review of any available recent lab and radiology data.  Preventive health is updated, specifically  Cancer screening and Immunization.   Questions or concerns regarding consultations or procedures which the PT has had in the interim are  addressed. The PT denies any adverse reactions to current medications since the last visit.  There are no new concerns.  C/o increased and uncontrolled allergy symptoms x 1 month. Nasal stuffiness with clear nasal drainage and excessive sneezing at times. She has been diligent in lifestyle change to improve health and has lost weight    Review of Systems See HPI   Denies recent fever or chills. Denies chest congestion, productive cough or wheezing. Denies chest pains, palpitations and leg swelling Denies abdominal pain, nausea, vomiting,diarrhea or constipation.   Denies dysuria, frequency, hesitancy or incontinence. Denies joint pain, swelling and limitation in mobility. Denies headaches, seizures, numbness, or tingling. Denies depression, anxiety or insomnia. Denies skin break down or rash.     Objective:   Physical Exam Patient alert and oriented and in no cardiopulmonary distress.  HEENT: No facial asymmetry, EOMI, no sinus tenderness,  oropharynx pink and moist.  Neck supple no adenopathy.Eryhtema and edema of nasal mucosa, excessive watering of eyes  Chest: Clear to auscultation bilaterally.  CVS: S1, S2 no murmurs, no S3.  ABD: Soft non tender. Bowel sounds normal.  Ext: No edema  MS: Adequate ROM spine, shoulders, hips and knees.  Skin: Intact, no ulcerations or rash noted.  Psych: Good eye contact, normal affect. Memory intact not anxious or depressed appearing.  CNS: CN 2-12 intact, power, tone and sensation normal  throughout.        Assessment & Plan:

## 2012-04-14 ENCOUNTER — Telehealth: Payer: Self-pay | Admitting: Family Medicine

## 2012-04-14 NOTE — Telephone Encounter (Signed)
She can try Coriciden - cough med with heart on the box. Mucinex for congestion  Or robitussin.

## 2012-04-21 NOTE — Telephone Encounter (Signed)
Called and left message for pt to return call.  

## 2012-04-29 ENCOUNTER — Other Ambulatory Visit: Payer: Self-pay | Admitting: Family Medicine

## 2012-05-05 NOTE — Telephone Encounter (Signed)
Patient never returned phone call

## 2012-06-22 ENCOUNTER — Other Ambulatory Visit: Payer: Self-pay | Admitting: Urology

## 2012-06-28 LAB — HEMOGLOBIN A1C
Hgb A1c MFr Bld: 5.8 % — ABNORMAL HIGH (ref <5.7)
Mean Plasma Glucose: 120 mg/dL — ABNORMAL HIGH (ref <117)

## 2012-06-30 ENCOUNTER — Encounter: Payer: Self-pay | Admitting: Family Medicine

## 2012-06-30 ENCOUNTER — Ambulatory Visit (INDEPENDENT_AMBULATORY_CARE_PROVIDER_SITE_OTHER): Payer: Medicare Other | Admitting: Family Medicine

## 2012-06-30 VITALS — BP 120/70 | HR 62 | Resp 15 | Ht 61.5 in | Wt 189.4 lb

## 2012-06-30 DIAGNOSIS — E663 Overweight: Secondary | ICD-10-CM

## 2012-06-30 DIAGNOSIS — M542 Cervicalgia: Secondary | ICD-10-CM

## 2012-06-30 DIAGNOSIS — Z23 Encounter for immunization: Secondary | ICD-10-CM

## 2012-06-30 DIAGNOSIS — J309 Allergic rhinitis, unspecified: Secondary | ICD-10-CM

## 2012-06-30 DIAGNOSIS — I10 Essential (primary) hypertension: Secondary | ICD-10-CM

## 2012-06-30 DIAGNOSIS — R7301 Impaired fasting glucose: Secondary | ICD-10-CM

## 2012-06-30 DIAGNOSIS — N329 Bladder disorder, unspecified: Secondary | ICD-10-CM

## 2012-06-30 MED ORDER — HYDROCHLOROTHIAZIDE 25 MG PO TABS: ORAL_TABLET | ORAL | Status: DC

## 2012-06-30 MED ORDER — LOVASTATIN 40 MG PO TABS: ORAL_TABLET | ORAL | Status: DC

## 2012-06-30 MED ORDER — PREDNISONE (PAK) 5 MG PO TABS: 5.0000 mg | ORAL_TABLET | ORAL | Status: DC

## 2012-06-30 MED ORDER — FLUTICASONE PROPIONATE 50 MCG/ACT NA SUSP: 2.0000 | Freq: Every day | NASAL | Status: DC

## 2012-06-30 MED ORDER — IBUPROFEN 800 MG PO TABS: ORAL_TABLET | ORAL | Status: AC

## 2012-06-30 NOTE — Patient Instructions (Addendum)
Welcome to medicare visit early February  It is important that you exercise regularly at least 30 minutes 7 times a week. If you develop chest pain, have severe difficulty breathing, or feel very tired, stop exercising immediately and seek medical attention   A healthy diet is rich in fruit, vegetables and whole grains. Poultry fish, nuts and beans are a healthy choice for protein rather then red meat. A low sodium diet and drinking 64 ounces of water daily is generally recommended. Oils and sweet should be limited. Carbohydrates especially for those who are diabetic or overweight, should be limited to 04-45 gram per meal. It is important to eat on a regular schedule, at least 3 times daily. Snacks should be primarily fruits, vegetables or nuts Blood sugar has increased and you have gained weight , please work on reversing this  Prednisone and ibuprofen are sent in for your neck pain, also reduce chewing gum for jaw pain.  Nasal spray to be used for allergies, fluticasone , sent to the pharmacy  Do not take aspirin while taking the ibuprofen and prednisone

## 2012-06-30 NOTE — Progress Notes (Signed)
  Subjective:    Patient ID: Desiree [name], female    DOB: [birth year], 66 y.o.   MRN: [id number]  HPI The PT is here for follow up and re-evaluation of chronic medical conditions, medication management and review of any available recent lab and radiology data.  Preventive health is updated, specifically  Cancer screening and Immunization.   Questions or concerns regarding consultations or procedures which the PT has had in the interim are  Addressed.Has seen urology and has surgery scheduled for repair of bladder prolapse The PT denies any adverse reactions to current medications since the last visit.  C/o uncontrolled allergy symptoms with clear nasal drainage and nasal congestion C/o uncontrolled right neck Pain radiating down upper extremityx 2 weeks    Review of Systems See HPI Denies recent fever or chills. C/o increased sinus and  nasal congestion, mucus is clear, denies ear pain or sore throat. Denies chest congestion, productive cough or wheezing. Denies chest pains, palpitations and leg swelling Denies abdominal pain, nausea, vomiting,diarrhea or constipation.   Denies dysuria, frequency, hesitancy   Denies headaches, seizures, numbness, or tingling. Denies depression, anxiety or insomnia. Denies skin break down or rash.        Objective:   Physical Exam Patient alert and oriented and in no cardiopulmonary distress.  HEENT: No facial asymmetry, EOMI, no sinus tenderness,  oropharynx pink and moist.  Neck:decreased ROM, no adenopathy.Nasal mucosa erythematous and edematous  Chest: Clear to auscultation bilaterally.  CVS: S1, S2 no murmurs, no S3.  ABD: Soft non tender. Bowel sounds normal.  Ext: No edema  [id number]  Cervical spine, adeqaute in shoulders, hips and knees.  Skin: Intact, no ulcerations or rash noted.  Psych: Good eye contact, normal affect. Memory intact not anxious or depressed appearing.  CNS: CN 2-12 intact, power, tone and sensation  normal throughout.        Assessment & Plan:

## 2012-07-04 NOTE — Assessment & Plan Note (Signed)
Increased and uncontrolled, pt to start daly medications for seasonal allergies

## 2012-07-04 NOTE — Assessment & Plan Note (Signed)
Deteriorated. Patient re-educated about  the importance of commitment to a  minimum of 150 minutes of exercise per week. The importance of healthy food choices with portion control discussed. Encouraged to start a food diary, count calories and to consider  joining a support group. Sample diet sheets offered. Goals set by the patient for the next several months.    

## 2012-07-04 NOTE — Assessment & Plan Note (Signed)
Has upcoming  surgical repair planned for December

## 2012-07-04 NOTE — Assessment & Plan Note (Signed)
Deteriorated with weight gain Pt counseled re the need to commit to lifestyle change to prevent diabetes

## 2012-07-04 NOTE — Assessment & Plan Note (Signed)
Uncontrolled with flare, sharp anti inflammatory course prescribed

## 2012-07-04 NOTE — Assessment & Plan Note (Signed)
Controlled, no change in medication DASH diet and commitment to daily physical activity for a minimum of 30 minutes discussed and encouraged, as a part of hypertension management. The importance of attaining a healthy weight is also discussed.  

## 2012-07-07 ENCOUNTER — Emergency Department (HOSPITAL_COMMUNITY): Payer: Worker's Compensation

## 2012-07-07 ENCOUNTER — Emergency Department (HOSPITAL_COMMUNITY)
Admission: EM | Admit: 2012-07-07 | Discharge: 2012-07-07 | Disposition: A | Discharge status: Home / Self Care | Payer: Worker's Compensation | Attending: Emergency Medicine | Admitting: Emergency Medicine

## 2012-07-07 ENCOUNTER — Encounter (HOSPITAL_COMMUNITY): Payer: Self-pay

## 2012-07-07 DIAGNOSIS — Y9289 Other specified places as the place of occurrence of the external cause: Secondary | ICD-10-CM | POA: Insufficient documentation

## 2012-07-07 DIAGNOSIS — Z8669 Personal history of other diseases of the nervous system and sense organs: Secondary | ICD-10-CM | POA: Insufficient documentation

## 2012-07-07 DIAGNOSIS — Y9301 Activity, walking, marching and hiking: Secondary | ICD-10-CM | POA: Insufficient documentation

## 2012-07-07 DIAGNOSIS — S63502A Unspecified sprain of left wrist, initial encounter: Secondary | ICD-10-CM

## 2012-07-07 DIAGNOSIS — S63509A Unspecified sprain of unspecified wrist, initial encounter: Secondary | ICD-10-CM | POA: Insufficient documentation

## 2012-07-07 DIAGNOSIS — I10 Essential (primary) hypertension: Secondary | ICD-10-CM | POA: Insufficient documentation

## 2012-07-07 DIAGNOSIS — Z8673 Personal history of transient ischemic attack (TIA), and cerebral infarction without residual deficits: Secondary | ICD-10-CM | POA: Insufficient documentation

## 2012-07-07 DIAGNOSIS — Z79899 Other long term (current) drug therapy: Secondary | ICD-10-CM | POA: Insufficient documentation

## 2012-07-07 DIAGNOSIS — Z853 Personal history of malignant neoplasm of breast: Secondary | ICD-10-CM | POA: Insufficient documentation

## 2012-07-07 DIAGNOSIS — W010XXA Fall on same level from slipping, tripping and stumbling without subsequent striking against object, initial encounter: Secondary | ICD-10-CM | POA: Insufficient documentation

## 2012-07-07 DIAGNOSIS — E785 Hyperlipidemia, unspecified: Secondary | ICD-10-CM | POA: Insufficient documentation

## 2012-07-07 DIAGNOSIS — Z8679 Personal history of other diseases of the circulatory system: Secondary | ICD-10-CM | POA: Insufficient documentation

## 2012-07-07 DIAGNOSIS — E669 Obesity, unspecified: Secondary | ICD-10-CM | POA: Insufficient documentation

## 2012-07-07 MED ORDER — HYDROCODONE-ACETAMINOPHEN 5-325 MG PO TABS: 1.0000 | ORAL_TABLET | Freq: Four times a day (QID) | ORAL | Status: DC | PRN

## 2012-07-07 NOTE — ED Provider Notes (Signed)
Medical screening examination/treatment/procedure(s) were conducted as a shared visit with non-physician practitioner(s) and myself.  I personally evaluated the patient during the encounter.  Left wrist pain after fall. X-ray negative. Will splint  Donnetta Hutching, MD 07/07/12 [phone]

## 2012-07-07 NOTE — ED Provider Notes (Signed)
History     CSN: [id number]  Arrival date & time 07/07/12  3086   First MD Initiated Contact with Patient 07/07/12 0957      Chief Complaint  Patient presents with  . Fall    (Consider location/radiation/quality/duration/timing/severity/associated sxs/prior treatment) HPI Comments: Pt tripped and fell on L outstretched hand yest.  R hand dominant.  No other injuries or complaints.  Dr. Lodema Hong is PCP.  Patient is a 66 y.o. female presenting with fall. The history is provided by the patient. No language interpreter was used.  Fall The accident occurred yesterday. The fall occurred while walking. She landed on concrete. The pain is moderate. She was ambulatory at the scene. There was no entrapment after the fall. There was no drug use involved in the accident. There was no alcohol use involved in the accident. Pertinent negatives include no fever and no numbness. She has tried NSAIDs for the symptoms. The treatment provided no relief.    Past Medical History  Diagnosis Date  . Obesity   . Malignant neoplasm of breast (female), unspecified site   . Hyperlipidemia   . Hypertension   . Aortic aneurysm 2009  . Bell's palsy   . CVA (cerebrovascular accident)     remote     Past Surgical History  Procedure Date  . Mastectomy, partial 2006  . Total abdominal hysterectomy w/ bilateral salpingoophorectomy 1990  . Neck surgery 2007  . Carpal tunnel release     Family History  Problem Relation Age of Onset  . Heart attack Mother   . Kidney failure Mother   . Hypertension Mother   . Throat cancer Father   . Prostate cancer Father     History  Substance Use Topics  . Smoking status: Never Smoker   . Smokeless tobacco: Not on file  . Alcohol Use: No    OB History    Grav Para Term Preterm Abortions TAB SAB Ect Mult Living                  Review of Systems  Constitutional: Negative for fever and chills.  Musculoskeletal:       Wrist inj   Skin: Negative for  wound.  Neurological: Negative for weakness and numbness.  All other systems reviewed and are negative.    Allergies  Codeine  Home Medications   Current Outpatient Rx  Name  Route  Sig  Dispense  Refill  . ACETAMINOPHEN 500 MG PO TABS      One tablet twice daily as needed for arthritic pain   60 tablet   2   . ASPIRIN 325 MG PO TABS   Oral   Take 325 mg by mouth daily.           Marland Kitchen CALCIUM CARBONATE-VITAMIN D 500-200 MG-UNIT PO TABS   Oral   Take 1 tablet by mouth daily.          Marland Kitchen CARVEDILOL 3.125 MG PO TABS   Oral   Take 1 tablet (3.125 mg total) by mouth 2 (two) times daily with a meal.   180 tablet   0   . ALLERGY/SINUS HEADACHE PO   Oral   Take 1 tablet by mouth daily as needed. allergies         . FLUTICASONE PROPIONATE 50 MCG/ACT NA SUSP   Nasal   Place 2 sprays into the nose daily.   16 g   2   . HYDROCHLOROTHIAZIDE 25 MG PO TABS  TAKE  ONE TABLET BY MOUTH EVERY MORNING FOR FLUID RETENTION   90 tablet   1   . IBUPROFEN 800 MG PO TABS      One tablet twice daily for 10 days   20 tablet   0   . LOSARTAN POTASSIUM 100 MG PO TABS   Oral   Take 100 mg by mouth at bedtime.          Marland Kitchen LOVASTATIN 40 MG PO TABS      TAKE 1 TABLET (40 MG TOTAL) BY MOUTH AT BEDTIME.   90 tablet   1   . ONE-DAILY MULTI VITAMINS PO TABS   Oral   Take 1 tablet by mouth daily.           . TRIAMCINOLONE ACETONIDE 0.05 % EX OINT      Apply sparingly to affected areas twice daily as needed   17 g   3   . HYDROCODONE-ACETAMINOPHEN 5-325 MG PO TABS   Oral   Take 1 tablet by mouth every 6 (six) hours as needed for pain.   20 tablet   0   . LORATADINE 10 MG PO TABS   Oral   Take 1 tablet (10 mg total) by mouth daily.   30 tablet   2   . OMEPRAZOLE 20 MG PO CPDR      TAKE 1 CAPSULE (20 MG TOTAL)  BY MOUTH DAILY.   90 capsule   1   . PREDNISONE (PAK) 5 MG PO TABS   Oral   Take 1 tablet (5 mg total) by mouth as directed. Use as directed   21  tablet   0     BP 101/71  Pulse 89  Temp 98.2 F (36.8 C) (Oral)  Resp 20  Ht 5\' 1"  (1.549 m)  Wt 186 lb (84.369 kg)  BMI 35.14 kg/m2  SpO2 100%  Physical Exam  Nursing note and vitals reviewed. Constitutional: She is oriented to person, place, and time. She appears well-developed and well-nourished. No distress.  HENT:  Head: Normocephalic and atraumatic.  Eyes: EOM are normal.  Neck: Normal range of motion.  Cardiovascular: Normal rate and regular rhythm.   Pulmonary/Chest: Effort normal.  Abdominal: Soft. She exhibits no distension. There is no tenderness.  Musculoskeletal: She exhibits tenderness.       Left wrist: She exhibits decreased range of motion, tenderness, bony tenderness and swelling. She exhibits no effusion, no crepitus, no deformity and no laceration.       Hands: Neurological: She is alert and oriented to person, place, and time.  Skin: Skin is warm and dry.  Psychiatric: She has a normal mood and affect. Judgment normal.    ED Course  Procedures (including critical care time)  Labs Reviewed - No data to display Dg Wrist Complete Left  07/07/2012  *RADIOLOGY REPORT*  Clinical Data: Fall, wrist pain  LEFT WRIST - COMPLETE 3+ VIEW  Comparison: None.  Findings: No fracture or dislocation is seen.  Severe degenerative changes at the first carpometacarpal joint.  Visualized soft tissues are grossly unremarkable.  IMPRESSION: No fracture or dislocation is seen.  Severe degenerative changes at the first carpometacarpal joint.   Original Report Authenticated By: Charline Bills, M.D.      1. Left wrist sprain       MDM  rx-hydrocodone, 20 Ibuprofen Ice Wrist splint F/u with PCP prn        Evalina Field, PA 07/07/12 1056

## 2012-07-07 NOTE — ED Notes (Signed)
Pt tripped and fell at work yesterday.  C/O pain to left wrist.   Pt has strong radial pulse but fingers are colder to touch on left hand than on right.  Pt can move fingers without difficulty.  Swelling noted to wrist.

## 2012-07-26 ENCOUNTER — Encounter (HOSPITAL_BASED_OUTPATIENT_CLINIC_OR_DEPARTMENT_OTHER): Payer: Self-pay | Admitting: *Deleted

## 2012-07-26 NOTE — Progress Notes (Addendum)
NPO AFTER MN. ARRIVES AT 0715. NEEDS ISTAT. CURRENT EKG, LOV NOTE, ECHO, CHEST CT TO BE FAXED FROM DR VHQIONGE. WILL TAKE COREG AND PRILOSEC AM OF SURG W/ SIP OF WATER. REVIEWED [facility] GUIDELINES, WILL BRING MEDS.  REVIEWED CHART W/ DR GERMEROTH MDA, OK TO PROCEED.

## 2012-07-27 ENCOUNTER — Other Ambulatory Visit: Payer: Self-pay | Admitting: Family Medicine

## 2012-08-02 ENCOUNTER — Ambulatory Visit (HOSPITAL_BASED_OUTPATIENT_CLINIC_OR_DEPARTMENT_OTHER)
Admission: RE | Admit: 2012-08-02 | Discharge: 2012-08-03 | Disposition: A | Discharge status: Home / Self Care | Payer: Medicare Other | Source: Ambulatory Visit | Attending: Urology | Admitting: Urology

## 2012-08-02 ENCOUNTER — Ambulatory Visit (HOSPITAL_BASED_OUTPATIENT_CLINIC_OR_DEPARTMENT_OTHER): Payer: Medicare Other | Admitting: Anesthesiology

## 2012-08-02 ENCOUNTER — Encounter (HOSPITAL_BASED_OUTPATIENT_CLINIC_OR_DEPARTMENT_OTHER): Payer: Self-pay | Admitting: Anesthesiology

## 2012-08-02 ENCOUNTER — Encounter (HOSPITAL_BASED_OUTPATIENT_CLINIC_OR_DEPARTMENT_OTHER)
Admission: RE | Disposition: A | Discharge status: Home / Self Care | Payer: Self-pay | Source: Ambulatory Visit | Attending: Urology

## 2012-08-02 DIAGNOSIS — N3946 Mixed incontinence: Secondary | ICD-10-CM | POA: Insufficient documentation

## 2012-08-02 DIAGNOSIS — Z853 Personal history of malignant neoplasm of breast: Secondary | ICD-10-CM | POA: Insufficient documentation

## 2012-08-02 DIAGNOSIS — IMO0002 Reserved for concepts with insufficient information to code with codable children: Secondary | ICD-10-CM

## 2012-08-02 DIAGNOSIS — E7800 Pure hypercholesterolemia, unspecified: Secondary | ICD-10-CM | POA: Insufficient documentation

## 2012-08-02 DIAGNOSIS — N8111 Cystocele, midline: Secondary | ICD-10-CM | POA: Insufficient documentation

## 2012-08-02 DIAGNOSIS — Z79899 Other long term (current) drug therapy: Secondary | ICD-10-CM | POA: Insufficient documentation

## 2012-08-02 DIAGNOSIS — N952 Postmenopausal atrophic vaginitis: Secondary | ICD-10-CM | POA: Insufficient documentation

## 2012-08-02 DIAGNOSIS — I10 Essential (primary) hypertension: Secondary | ICD-10-CM | POA: Insufficient documentation

## 2012-08-02 DIAGNOSIS — K219 Gastro-esophageal reflux disease without esophagitis: Secondary | ICD-10-CM | POA: Insufficient documentation

## 2012-08-02 HISTORY — PX: CYSTOCELE REPAIR: SHX163

## 2012-08-02 HISTORY — DX: Aneurysm of the ascending aorta, without rupture: I71.21

## 2012-08-02 HISTORY — DX: Gastro-esophageal reflux disease without esophagitis: K21.9

## 2012-08-02 HISTORY — DX: Other specified disorders of nose and nasal sinuses: J34.89

## 2012-08-02 HISTORY — DX: Impaired fasting glucose: R73.01

## 2012-08-02 HISTORY — DX: Personal history of malignant neoplasm of breast: Z85.3

## 2012-08-02 HISTORY — DX: Personal history of other diseases of the nervous system and sense organs: Z86.69

## 2012-08-02 HISTORY — DX: Nocturia: R35.1

## 2012-08-02 HISTORY — DX: Allergic rhinitis, unspecified: J30.9

## 2012-08-02 HISTORY — DX: Cystocele, unspecified: N81.10

## 2012-08-02 HISTORY — DX: Unspecified osteoarthritis, unspecified site: M19.90

## 2012-08-02 HISTORY — DX: Thoracic aortic aneurysm, without rupture: I71.2

## 2012-08-02 HISTORY — DX: Personal history of transient ischemic attack (TIA), and cerebral infarction without residual deficits: Z86.73

## 2012-08-02 HISTORY — DX: Mixed incontinence: N39.46

## 2012-08-02 HISTORY — DX: Other cervical disc degeneration, unspecified cervical region: M50.30

## 2012-08-02 HISTORY — DX: Cardiac murmur, unspecified: R01.1

## 2012-08-02 LAB — POCT I-STAT 4, (NA,K, GLUC, HGB,HCT)
HCT: 41 % (ref 36.0–46.0)
Hemoglobin: 13.9 g/dL (ref 12.0–15.0)
Potassium: 3.4 mEq/L — ABNORMAL LOW (ref 3.5–5.1)
Sodium: 142 mEq/L (ref 135–145)

## 2012-08-02 SURGERY — COLPORRHAPHY, ANTERIOR, FOR CYSTOCELE REPAIR
Anesthesia: General | Site: Vagina | Wound class: Clean Contaminated

## 2012-08-02 MED ORDER — LACTATED RINGERS IV SOLN
INTRAVENOUS | Status: DC
  Administered 2012-08-02 (×2): via INTRAVENOUS
  Filled 2012-08-02: qty 1000

## 2012-08-02 MED ORDER — DIPHENHYDRAMINE HCL 12.5 MG/5ML PO ELIX
12.5000 mg | ORAL_SOLUTION | Freq: Four times a day (QID) | ORAL | Status: DC | PRN
Start: 2012-08-02 — End: 2012-08-03
  Filled 2012-08-02: qty 5

## 2012-08-02 MED ORDER — HYDROCODONE-ACETAMINOPHEN 5-325 MG PO TABS
1.0000 | ORAL_TABLET | ORAL | Status: DC | PRN
  Administered 2012-08-02: 1 via ORAL
  Filled 2012-08-02: qty 2

## 2012-08-02 MED ORDER — BISACODYL 5 MG PO TBEC
5.0000 mg | DELAYED_RELEASE_TABLET | Freq: Every day | ORAL | Status: DC | PRN
  Filled 2012-08-02: qty 1

## 2012-08-02 MED ORDER — LIDOCAINE HCL (CARDIAC) 20 MG/ML IV SOLN
INTRAVENOUS | Status: DC | PRN
  Administered 2012-08-02: 75 mg via INTRAVENOUS

## 2012-08-02 MED ORDER — CARVEDILOL 3.125 MG PO TABS
3.1250 mg | ORAL_TABLET | Freq: Two times a day (BID) | ORAL | Status: DC
  Filled 2012-08-02: qty 1

## 2012-08-02 MED ORDER — ACETAMINOPHEN 10 MG/ML IV SOLN
INTRAVENOUS | Status: DC | PRN
  Administered 2012-08-02: 1000 mg via INTRAVENOUS

## 2012-08-02 MED ORDER — STERILE WATER FOR IRRIGATION IR SOLN
Status: DC | PRN
  Administered 2012-08-02: 10 mL

## 2012-08-02 MED ORDER — MEPERIDINE HCL 25 MG/ML IJ SOLN
6.2500 mg | INTRAMUSCULAR | Status: DC | PRN
  Filled 2012-08-02: qty 1

## 2012-08-02 MED ORDER — CIPROFLOXACIN HCL 500 MG PO TABS
500.0000 mg | ORAL_TABLET | Freq: Two times a day (BID) | ORAL | Status: DC
  Administered 2012-08-02 – 2012-08-03 (×2): 500 mg via ORAL
  Filled 2012-08-02: qty 1

## 2012-08-02 MED ORDER — KETOROLAC TROMETHAMINE 30 MG/ML IJ SOLN
INTRAMUSCULAR | Status: DC | PRN
  Administered 2012-08-02: 30 mg via INTRAVENOUS

## 2012-08-02 MED ORDER — EPHEDRINE SULFATE 50 MG/ML IJ SOLN
INTRAMUSCULAR | Status: DC | PRN
  Administered 2012-08-02: 15 mg via INTRAVENOUS
  Administered 2012-08-02: 10 mg via INTRAVENOUS
  Administered 2012-08-02: 15 mg via INTRAVENOUS

## 2012-08-02 MED ORDER — DEXAMETHASONE SODIUM PHOSPHATE 4 MG/ML IJ SOLN
INTRAMUSCULAR | Status: DC | PRN
  Administered 2012-08-02: 10 mg via INTRAVENOUS

## 2012-08-02 MED ORDER — ZOLPIDEM TARTRATE 5 MG PO TABS
5.0000 mg | ORAL_TABLET | Freq: Every evening | ORAL | Status: DC | PRN
  Filled 2012-08-02: qty 1

## 2012-08-02 MED ORDER — ESTRADIOL 0.1 MG/GM VA CREA
TOPICAL_CREAM | VAGINAL | Status: DC | PRN
  Administered 2012-08-02: 1 via VAGINAL

## 2012-08-02 MED ORDER — INDIGOTINDISULFONATE SODIUM 8 MG/ML IJ SOLN
INTRAMUSCULAR | Status: DC | PRN
  Administered 2012-08-02: 5 mL via INTRAVENOUS

## 2012-08-02 MED ORDER — CEFAZOLIN SODIUM 1-5 GM-% IV SOLN
1.0000 g | INTRAVENOUS | Status: DC
  Filled 2012-08-02: qty 50

## 2012-08-02 MED ORDER — STERILE WATER FOR IRRIGATION IR SOLN
Status: DC | PRN
  Administered 2012-08-02: 3000 mL

## 2012-08-02 MED ORDER — DEXTROSE-NACL 5-0.45 % IV SOLN
INTRAVENOUS | Status: DC
  Administered 2012-08-02 – 2012-08-03 (×2): via INTRAVENOUS
  Filled 2012-08-02: qty 1000

## 2012-08-02 MED ORDER — ONDANSETRON HCL 4 MG/2ML IJ SOLN
INTRAMUSCULAR | Status: DC | PRN
  Administered 2012-08-02: 4 mg via INTRAVENOUS

## 2012-08-02 MED ORDER — LORATADINE 10 MG PO TABS
10.0000 mg | ORAL_TABLET | Freq: Every day | ORAL | Status: DC
  Filled 2012-08-02: qty 1

## 2012-08-02 MED ORDER — SODIUM CHLORIDE 0.9 % IJ SOLN
INTRAMUSCULAR | Status: DC | PRN
  Administered 2012-08-02: 50 mL

## 2012-08-02 MED ORDER — CEFAZOLIN SODIUM-DEXTROSE 2-3 GM-% IV SOLR
2.0000 g | INTRAVENOUS | Status: AC
  Administered 2012-08-02: 2 g via INTRAVENOUS
  Filled 2012-08-02: qty 50

## 2012-08-02 MED ORDER — BUPIVACAINE-EPINEPHRINE 0.5% -1:200000 IJ SOLN
INTRAMUSCULAR | Status: DC | PRN
  Administered 2012-08-02: 40 mL

## 2012-08-02 MED ORDER — PROMETHAZINE HCL 25 MG/ML IJ SOLN
6.2500 mg | INTRAMUSCULAR | Status: DC | PRN
  Filled 2012-08-02: qty 1

## 2012-08-02 MED ORDER — FLUTICASONE PROPIONATE 50 MCG/ACT NA SUSP
2.0000 | Freq: Every day | NASAL | Status: DC
  Filled 2012-08-02: qty 16

## 2012-08-02 MED ORDER — LACTATED RINGERS IV SOLN
INTRAVENOUS | Status: DC
  Filled 2012-08-02: qty 1000

## 2012-08-02 MED ORDER — GLYCOPYRROLATE 0.2 MG/ML IJ SOLN
INTRAMUSCULAR | Status: DC | PRN
  Administered 2012-08-02 (×2): 0.2 mg via INTRAVENOUS

## 2012-08-02 MED ORDER — ONDANSETRON HCL 4 MG/2ML IJ SOLN
4.0000 mg | INTRAMUSCULAR | Status: DC | PRN
  Filled 2012-08-02: qty 2

## 2012-08-02 MED ORDER — SODIUM CHLORIDE 0.9 % IR SOLN
Status: DC | PRN
  Administered 2012-08-02: 10:00:00

## 2012-08-02 MED ORDER — PANTOPRAZOLE SODIUM 40 MG PO TBEC
40.0000 mg | DELAYED_RELEASE_TABLET | Freq: Every day | ORAL | Status: DC
  Filled 2012-08-02: qty 1

## 2012-08-02 MED ORDER — SIMVASTATIN 5 MG PO TABS
5.0000 mg | ORAL_TABLET | Freq: Every day | ORAL | Status: DC
  Filled 2012-08-02: qty 1

## 2012-08-02 MED ORDER — FENTANYL CITRATE 0.05 MG/ML IJ SOLN
25.0000 ug | INTRAMUSCULAR | Status: DC | PRN
  Filled 2012-08-02: qty 1

## 2012-08-02 MED ORDER — PROPOFOL 10 MG/ML IV BOLUS
INTRAVENOUS | Status: DC | PRN
  Administered 2012-08-02: 200 mg via INTRAVENOUS

## 2012-08-02 MED ORDER — HYDROCHLOROTHIAZIDE 25 MG PO TABS
25.0000 mg | ORAL_TABLET | Freq: Every morning | ORAL | Status: DC
  Filled 2012-08-02: qty 1

## 2012-08-02 MED ORDER — LOSARTAN POTASSIUM 50 MG PO TABS
50.0000 mg | ORAL_TABLET | Freq: Every day | ORAL | Status: DC
  Filled 2012-08-02: qty 1

## 2012-08-02 MED ORDER — HYDROMORPHONE HCL PF 1 MG/ML IJ SOLN
0.5000 mg | INTRAMUSCULAR | Status: DC | PRN
  Filled 2012-08-02: qty 1

## 2012-08-02 MED ORDER — DIPHENHYDRAMINE HCL 50 MG/ML IJ SOLN
12.5000 mg | Freq: Four times a day (QID) | INTRAMUSCULAR | Status: DC | PRN
  Filled 2012-08-02: qty 0.25

## 2012-08-02 MED ORDER — FENTANYL CITRATE 0.05 MG/ML IJ SOLN
INTRAMUSCULAR | Status: DC | PRN
  Administered 2012-08-02 (×2): 25 ug via INTRAVENOUS
  Administered 2012-08-02: 50 ug via INTRAVENOUS
  Administered 2012-08-02 (×3): 25 ug via INTRAVENOUS

## 2012-08-02 SURGICAL SUPPLY — 58 items
ADH SKN CLS APL DERMABOND .7 (GAUZE/BANDAGES/DRESSINGS)
BAG URINE DRAINAGE (UROLOGICAL SUPPLIES) ×2 IMPLANT
BLADE SURG 10 STRL SS (BLADE) ×2 IMPLANT
BLADE SURG 15 STRL LF DISP TIS (BLADE) ×1 IMPLANT
BLADE SURG 15 STRL SS (BLADE) ×2
BLADE SURG ROTATE 9660 (MISCELLANEOUS) ×2 IMPLANT
BOOTIES KNEE HIGH SLOAN (MISCELLANEOUS) ×1 IMPLANT
CANISTER SUCTION 1200CC (MISCELLANEOUS) IMPLANT
CANISTER SUCTION 2500CC (MISCELLANEOUS) ×3 IMPLANT
CATH FOLEY 2WAY SLVR  5CC 16FR (CATHETERS) ×1
CATH FOLEY 2WAY SLVR 5CC 16FR (CATHETERS) ×1 IMPLANT
CLOTH BEACON ORANGE TIMEOUT ST (SAFETY) ×2 IMPLANT
COVER MAYO STAND STRL (DRAPES) ×2 IMPLANT
COVER TABLE BACK 60X90 (DRAPES) ×2 IMPLANT
DERMABOND ADVANCED (GAUZE/BANDAGES/DRESSINGS)
DERMABOND ADVANCED .7 DNX12 (GAUZE/BANDAGES/DRESSINGS) IMPLANT
DEVICE CAPIO SLIM BOX (INSTRUMENTS) ×1 IMPLANT
DEVICE CAPIO SUTURING (INSTRUMENTS)
DEVICE CAPIO SUTURING OPC (INSTRUMENTS) IMPLANT
DISSECTOR ROUND CHERRY 3/8 STR (MISCELLANEOUS) ×2 IMPLANT
DRAPE CAMERA CLOSED 9X96 (DRAPES) ×2 IMPLANT
DRAPE UNDERBUTTOCKS STRL (DRAPE) ×2 IMPLANT
FLOSEAL 10ML (HEMOSTASIS) IMPLANT
GAUZE SPONGE 4X4 16PLY XRAY LF (GAUZE/BANDAGES/DRESSINGS) ×1 IMPLANT
GLOVE BIO SURGEON STRL SZ7.5 (GLOVE) ×2 IMPLANT
GLOVE BIOGEL M 7.0 STRL (GLOVE) ×2 IMPLANT
GLOVE BIOGEL PI IND STRL 7.0 (GLOVE) IMPLANT
GLOVE BIOGEL PI INDICATOR 7.0 (GLOVE) ×1
GLOVE INDICATOR 6.5 STRL GRN (GLOVE) ×1 IMPLANT
GOWN PREVENTION PLUS LG XLONG (DISPOSABLE) ×2 IMPLANT
GOWN STRL REIN XL XLG (GOWN DISPOSABLE) ×2 IMPLANT
NEEDLE HYPO 22GX1.5 SAFETY (NEEDLE) ×4 IMPLANT
PACKING VAGINAL (PACKING) ×2 IMPLANT
PENCIL BUTTON HOLSTER BLD 10FT (ELECTRODE) ×2 IMPLANT
PLUG CATH AND CAP STER (CATHETERS) ×2 IMPLANT
RETRACTOR LONRSTAR 16.6X16.6CM (MISCELLANEOUS) ×1 IMPLANT
RETRACTOR STAY HOOK 5MM (MISCELLANEOUS) ×2 IMPLANT
RETRACTOR STER APS 16.6X16.6CM (MISCELLANEOUS) ×2
SET IRRIG Y TYPE TUR BLADDER L (SET/KITS/TRAYS/PACK) ×2 IMPLANT
SHEET LAVH (DRAPES) ×2 IMPLANT
SLING SOLYX SYSTEM SIS BX (SLING) IMPLANT
SPONGE LAP 4X18 X RAY DECT (DISPOSABLE) ×3 IMPLANT
SUCTION FRAZIER TIP 10 FR DISP (SUCTIONS) ×2 IMPLANT
SUT CAPIO POLYGLYCOLIC (SUTURE) IMPLANT
SUT ETHILON 2 0 PS N (SUTURE) IMPLANT
SUT MON AB 2-0 SH 27 (SUTURE)
SUT MON AB 2-0 SH27 (SUTURE) IMPLANT
SUT SILK 3 0 PS 1 (SUTURE) ×2 IMPLANT
SUT VIC AB 2-0 UR6 27 (SUTURE) ×12 IMPLANT
SYR BULB IRRIGATION 50ML (SYRINGE) ×2 IMPLANT
SYR CONTROL 10ML LL (SYRINGE) ×4 IMPLANT
SYRINGE 10CC LL (SYRINGE) ×2 IMPLANT
TRAY DSU PREP LF (CUSTOM PROCEDURE TRAY) ×2 IMPLANT
TUBE CONNECTING 12X1/4 (SUCTIONS) ×4 IMPLANT
Uphold lite (Urological Implant) ×1 IMPLANT
WATER STERILE IRR 3000ML UROMA (IV SOLUTION) ×1 IMPLANT
WATER STERILE IRR 500ML POUR (IV SOLUTION) ×3 IMPLANT
YANKAUER SUCT BULB TIP NO VENT (SUCTIONS) IMPLANT

## 2012-08-02 NOTE — Progress Notes (Signed)
Bjorn Loser (daughter) [phone]

## 2012-08-02 NOTE — H&P (Signed)
History and Physical  Chief Complaint: pelvic floor prolapse  History of Present Illness:  Reason For Visit  F/u to review urodynamic results   Active Problems Problems  1. 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 Urge And Stress Incontinence 788.33 3. 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 Broaden is a 66 year old female returns today to review urodynamic results.  Hx of vaginal prolapse, and grade 3 cystocele for one year. Her prolapse is worse with pressure differential on her feet walking. It "feels like a ball". The bladder is visualized at the introitus. She is para 5-4-one. All uncomplicated vaginal deliveries. She is post TAH 1990 1 in the evening account for fibroids.  The patient complains of some urgency and urge incontinence. She voids at first urge, and complains of cough urinary continence with a cold or upper respiratory infection.  Note the patient has a history of stage 0 breast cancer, with a family history of breast cancer, and was taken off estrogens.  On physical examination, the patient was noted to have no urethral hypermobility. Vaginal examination showed atrophy, with poorly estrogenized introitus. The patient had a stage II midline defect anterior vault cystocele. The cervix was absent. There was no urethral prolapse.  Urodynamics is accomplished on 06/09/12, in the sitting position. First sensation until a occurs at 203 7 cc, with normal desire to void at 350 cc and strong desire at 378 cc. The bladder remains stable. There is no rapid fill or cough-induced instability. The patient does not leak for leak point pressure, and abdominal pressures of 75-104 center his water. The patient does not leak with or without reduction of her rather large cystocele.  Pressure flow study: The patient has a maximum flow rate of 25 cc per second. She is a postvoid residual of 75 cc. The patient did not leak with vaginal pack in  place.  VCUG shows bladder descent of more than 3 cm.  This patient does not demonstrate stress urinary incontinence on this study with or without reduction of her cystocele. She notes that her leakage occurs which he has a cold worse having coughing or sneezing. She does not demonstrate instability. However, she does have a large cystocele, and notes that what "bothers her most is a pressure that she feels from her dropped bladder".   Past Medical History Problems  1. 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 History of  Arthritis V13.4 3. History of  Breast Cancer Right V10.3 4. History of  Esophageal Reflux 530.81 5. History of  Hypercholesterolemia 272.0 6. History of  Hypertension 401.9 7. History of  Murmurs 785.2 8. History of  Radiation Therapy V15.3  Surgical History Problems  1. History of  Breast Surgery Lumpectomy Right 2. History of  Hand Surgery Bilateral 3. History of  Hysterectomy V45.77 4. History of  Neck Surgery  Current Meds 1. Calcium + D TABS; Therapy: (Recorded:03Feb2012) to 2. Carvedilol 3.125 MG Oral Tablet; Therapy: (Recorded:03Feb2012) to 3. Centrum Silver TABS; Therapy: (Recorded:03Feb2012) to 4. Hydrochlorothiazide 25 MG Oral Tablet; Therapy: (Recorded:03Feb2012) to 5. Losartan Potassium 100 MG Oral Tablet; Therapy: (Recorded:21Jun2013) to 6. Lovastatin 40 MG Oral Tablet; Therapy: (Recorded:03Feb2012) to 7. Omeprazole 20 MG Oral Tablet Delayed Release; Therapy: (Recorded:03Feb2012) to 8. Triamcinolone Acetonide 0.1 % External Ointment; Therapy: 19Mar2013 to  Allergies Medication  1. Codeine Derivatives  Family History Problems  1. Maternal history of  Acute Myocardial Infarction V17.3 2. Sororal history of  Breast  Cancer V16.3 3. Family history of  Death In The Family Father 4. Family history of  Death In The Family Mother 5. Family history of  Family Health Status Number Of Children 2 sons 2 daughters 6. Paternal history of   Laryngeal Cancer V16.2 7. Paternal history of  Lung Cancer V16.1  Social History Problems  1. Caffeine Use 2 coffee per day 2. Marital History - Divorced V61.03 3. Never A Smoker 4. 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 History of  Alcohol Use  Review of Systems Genitourinary, constitutional, skin, eye, otolaryngeal, hematologic/lymphatic, cardiovascular, pulmonary, endocrine, musculoskeletal, gastrointestinal, neurological and psychiatric system(s) were reviewed and pertinent findings if present are noted.  Genitourinary: incontinence and pelvic pressure.    Vitals Vital Signs [Data Includes: Last 1 Day]  23Oct2013 03:03PM  Blood Pressure: 145 / 83 Temperature: 98.6 F Heart Rate: 65  Physical Exam Genitourinary: Chaperone Present: kim.  Examination of the external genitalia shows vulvar atrophy, but no labial adhesions. The urethra is normal in appearance, not tender, does not appear stenotic and no urethral caruncle. There is no urethral mass. Urethral hypermobility is not present. There is no urethral discharge. No periurethral cyst is identified. There is no urethral prolapse. Vaginal exam demonstrates atrophy and the vaginal epithelium to be poorly estrogenized, but no discharge and no tenderness. A cystocele is present with a midline defect (grade 3 /4).  Stage II at entroitus. The cervix is is absent. There is no cervical discharge. There is no cervical motion tenderness The uterus is absent, but non tender. The adnexa are palpably normal. The bladder is normal on palpation, non tender and not distended. The perineum is normal on inspection.    Results/Data Urine [Data Includes: Last 1 Day]   23Oct2013  COLOR STRAW   APPEARANCE CLEAR   SPECIFIC GRAVITY <1.005   pH 6.0   GLUCOSE NEG mg/dL  BILIRUBIN NEG   KETONE NEG mg/dL  BLOOD TRACE   PROTEIN NEG mg/dL  UROBILINOGEN 0.2 mg/dL  NITRITE NEG   LEUKOCYTE ESTERASE LARGE   SQUAMOUS EPITHELIAL/HPF FEW   WBC 3-6 WBC/hpf  RBC  0-2 RBC/hpf  BACTERIA NONE SEEN   CRYSTALS NONE SEEN   CASTS NONE SEEN    Assessment Assessed  1. 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 We have discussed possible repairs with Mrs Windom and her son, and also the posssibility of including her in the [facility] UPHOLD study. ( Son is wondering what the incentives to do the study would be). Options would be to repair her with notive tissue, but this would have 50% chance of failure; or use of biologic tissue and sacrospinus tissue level I repair, with improved 20 % failure rate, or use of UPHOLD mesh with 3% ( in my hands) failure rate. We havd discussed the complications and the general lawsuits regarding mesh.    She wil consider her options and let me know how she would like to proceed. 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   Plan Health Maintenance (V70.0)  1. UA With REFLEX  Done: 23Oct2013 02:51PM Vaginal Wall Prolapse With Midline Cystocele (618.01)  2. Follow-up Month x 3 Office  Follow-up  Requested for: 23Oct2013   Pt  to decide how she wants to be repaired. She may call me and let me know what she would like to do. She is leaning away from the study, but might like to have the UPHOLD repair off-study.   Signatures Electronically signed by : [name], M.D.; Jun 16 2012  4:52PM   Past Medical History  Diagnosis Date  . Hyperlipidemia   . Hypertension   . History of CVA (cerebrovascular accident) PER SCAN IN 2011  . 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 Allergic rhinitis   . Vaginal wall prolapse   . Mixed urge and stress  incontinence   . History of Bell's palsy 2011  RIGHT SIDE -- RESOLVED  . Thoracic ascending aortic aneurysm LAST CHEST CT 09-02-2010--  FOLLOWED BY  CARDIOLOGIST--  DR [id number]  . Nocturia   . Sinus drainage   . Heart murmur ASYMPTOMATIC  . Impaired fasting glucose PER PCP  DR SIMPSON    WATCH DIET  . GERD (gastroesophageal reflux disease) OCCASIONAL  . Arthritis HIPS/ WRISTS  . DDD (degenerative disc disease), cervical    Past Surgical History  Procedure Date  . Total abdominal hysterectomy w/ bilateral salpingoophorectomy 1990  . Neck surgery 2007  . Carpal tunnel release   . Cervical disc surgery 12-12-2005  DR STERN    LEFT  C6 - C7 HERINATED / DDD/ SPONDYLOSIS  . Right partial mastectomy 11-11-2004  DR PETER YOUNG    DUCTAL CARCINOMA IN SITU RIGHT BREAST  . Cardiac catheterization 02-01-2009  DR [facility]    NORMAL CORONARY ARTERIES/ NORMAL LV SIZE AND FUNCTION/ AORTIC ROOT SIZE AT THE UPPER LIMIT OF NORMAL   . Transthoracic echocardiogram 03-30-2008  DR MARGARET SIMPSON    LV SIZE AND FUNCTION NORMAL/ MODERATE AORTIC ARCH DILATATION/ MILD MR  . Bilateral carpal tunnel release 1980'S    Medications: I have reviewed the patient's current medications. Allergies:  Allergies  Allergen Reactions  . Codeine Other (See Comments)    CONFUSION/ DIZZY    Family History  Problem Relation Age of Onset  . Heart attack Mother   . Kidney failure Mother   . Hypertension Mother   . Throat cancer Father   . Prostate cancer Father    Social History:  reports that she has never smoked. She has never used smokeless tobacco. She reports that she does not drink alcohol or use illicit drugs.  ROS: All systems are reviewed and negative except as noted. She was placed on 6 day prednisone dosepack per Dr. Lodema Hong for URI.  Physical Exam:  Vital signs in last 24 hours: Temp:  [97.7 F (36.5 C)] 97.7 F (36.5 C) (12/09 0752) Pulse Rate:  [58] 58  (12/09 0752) Resp:  [18] 18  (12/09  0752) BP: (136)/(83) 136/83 mmHg (12/09 0752) SpO2:  [99 %] 99 % (12/09 0752) Weight:  [83.462 kg (184 lb)] 83.462 kg (184 lb) (12/09 0752)  Cardiovascular: Skin warm; not flushed Respiratory: Breaths quiet; no shortness of breath Abdomen: No masses Neurological: Normal sensation to touch Musculoskeletal: Normal motor function arms and legs Lymphatics: No inguinal adenopathy Skin: No rashes Genitourinary:  See H/P  Laboratory Data:  No results found for this or any previous visit (from the past 24 hour(s)). No results found for this or any previous visit (from the past 240 hour(s)). Creatinine: No results found for this basename: CREATININE:7 in the last 168 hours  Xrays: See report/chart  Impression/Assessment:  Symptomatic pelvic floor prolapse, for repair this AM. We have again reviewed he prolapse and her repair. She will stil feel tissue in her vagina post op, as I will leave "streatched-out" vaginal wall when closing, because it will contract over the next few months. Otherwise, the anterior vaginal wall may be too tight and cause pain in the future.   Plan:  Proceed with anterior vaginal wall reconstruction.   Merelin Human I 08/02/2012, 8:27 AM

## 2012-08-02 NOTE — Anesthesia Postprocedure Evaluation (Signed)
  Anesthesia Post-op Note  Patient: Desiree [name]  Procedure(s) Performed: Procedure(s) (LRB): ANTERIOR REPAIR (CYSTOCELE) (N/A)  Patient Location: PACU  Anesthesia Type: General  Level of Consciousness: awake and alert   Airway and Oxygen Therapy: Patient Spontanous Breathing  Post-op Pain: mild  Post-op Assessment: Post-op Vital signs reviewed, Patient's Cardiovascular Status Stable, Respiratory Function Stable, Patent Airway and No signs of Nausea or vomiting  Last Vitals:  Filed Vitals:   08/02/12 1115  BP: 142/80  Pulse:   Temp:   Resp:     Post-op Vital Signs: stable   Complications: No apparent anesthesia complications

## 2012-08-02 NOTE — Transfer of Care (Signed)
Immediate Anesthesia Transfer of Care Note  Patient: Desiree [name]  Procedure(s) Performed: Procedure(s) (LRB): ANTERIOR REPAIR (CYSTOCELE) (N/A)  Patient Location: Patient transported to PACU with oxygen via face mask at 4 Liters / Min  Anesthesia Type: General  Level of Consciousness: awake and alert   Airway & Oxygen Therapy: Patient Spontanous Breathing and Patient connected to face mask oxygen  Post-op Assessment: Report given to PACU RN and Post -op Vital signs reviewed and stable  Post vital signs: Reviewed and stable  Dentition: Teeth and oropharynx remain in pre-op condition  Complications: No apparent anesthesia complications

## 2012-08-02 NOTE — Op Note (Signed)
Pre-operative diagnosis : Anterior vault pelvic floor prolapse  Postoperative diagnosis: Same  Operation: Anterior vault sacrospinous fixation repair using [employer] uphold light mesh, and Kelly plication  Surgeon:  S. Patsi Sears, MD  First assistant: None  [id number]}  Preparation: After appropriate preanesthesia, the patient was brought the operative room, placed on the operating table in the dorsal supine position where general LMA anesthesia was introduced. She was replaced in the dorsal lithotomy position where the pubis was prepped with Betadine solution, and draped in usual fashion. The arm band was double checked.  Review history:  Desiree [name] is a 66 year old female with a hx of vaginal prolapse, and grade 3 cystocele for one year. Her prolapse is worse with pressure differential on her feet walking. It "feels like a ball". The bladder is visualized at the introitus. She is para 5-4-1. All uncomplicated vaginal deliveries. She is post TAH 1990, 2ndary  fibroids.   She  complains of some urgency and urge incontinence. She voids at first urge, and complains of cough urinary continence with a cold or upper respiratory infection.  Note the patient has a history of stage 0 breast cancer, with a family history of breast cancer, and was taken off estrogens.  On physical examination, the patient was noted to have no urethral hypermobility. Vaginal examination showed atrophy, with poorly estrogenized introitus. The patient had a stage II midline defect anterior vault cystocele. The cervix was absent. There was no urethral prolapse.n Desiree [name] test was negative.   Urodynamics  Showed first sensation at 203 cc, with normal desire to void at 350 cc and strong desire at 378 cc. The bladder remains stable. There is no rapid fill or cough-induced instability. The patient does not leak for leak point pressure, at abdominal pressures of 75-104 cm H20. The patient does not leak with or  without reduction of her rather large cystocele.  She  has a maximum flow rate of 25 cc per second, and a postvoid residual of 75 cc. The patient did not leak with vaginal pack in place.  VCUG shows bladder descent of more than 3 cm.  This patient does not demonstrate stress urinary incontinence on this study with or without reduction of her cystocele. She notes that her leakage occurs which he has a cold worse having coughing or sneezing. She does not demonstrate instability. However, she does have a large cystocele, and notes that what "bothers her most is a pressure that she feels from her dropped bladder".    Statement of  Likelihood of Success: Excellent. TIME-OUT observed.:  Procedure:  Examination revealed grade 3 stage II anterior vault prolapse. There is no rectocele noted. The patient did have vaginal atrophy noted. Foley catheter is placed, and the bladder neck was marked with a blue marking pen. Area of incision was marked approximately 2 cm proximal to the urethrovaginal junction horizontally. 60 cc of Marcaine 0.25% with epinephrine, 1-  200,000 was then injected into the incision site, for hydrodissection and hemostasis purposes. A 6 cm horizontal incision is then made in the area previously marked, and subcutaneous tissue is dissected with sharp and blunt dissection. Following dissection of the large cystocele, dissection is carried into the pelvic floor, where the ischial spines were identified bilaterally, and the sacrospinous ligaments were identified as well bilaterally. Using 2-0 Vicryl suture, a small Kelly plication was accomplished. The patient had significant apical cystocele, requiring extra 2-0 Vicryl suture for plication.  Using the [facility] device, sutures were placed bilaterally in the  sacrospinous ligament, and the Uphold Lite mesh was placed bilaterally. It was sutured both proximalward and distalward with 3 separate 2-0 Vicryl sutures. The arms of the mesh were then  brought across the midline in standard fashion, and the sutures cut, and the plastic sleeves removed. Excess mesh was trimmed. Antibiotic irrigation was then used. No bleeding was noted.  Indigo carmine was given, and the wound was closed with 2 separate 2-0 Vicryl sutures without difficulty. No vaginal wall epithelium was trimmed in order to allow for anterior vault contraction in the next several months of post operative healing.  The Foley catheter was removed, and cystourethroscopy was accomplished, which showed no suture within the bladder. There was no mesh within the bladder. Jets of blue contrast were seen from the ureteral orifices bilaterally. The cystoscope was removed, and Foley catheter was replaced. Vaginal packing was placed.  Because of the patient's allergy to codeine, she was not given a B. and O. suppository. However, she was given IV Tylenol prior to incision, and IV Toradol at the end of the procedure. She was awakened, and taken to recovery room in excellent condition.

## 2012-08-02 NOTE — Anesthesia Procedure Notes (Signed)
Procedure Name: LMA Insertion Date/Time: 08/02/2012 8:52 AM Performed by: [name] Pre-anesthesia Checklist: Patient identified, Emergency Drugs available, Suction available and Patient being monitored Patient Re-evaluated:Patient Re-evaluated prior to inductionOxygen Delivery Method: Circle System Utilized Preoxygenation: Pre-oxygenation with 100% oxygen Intubation Type: IV induction Ventilation: Mask ventilation without difficulty LMA: LMA inserted LMA Size: 4.0 Number of attempts: 1 Airway Equipment and Method: bite block Placement Confirmation: positive ETCO2 Tube secured with: Tape Dental Injury: Teeth and Oropharynx as per pre-operative assessment

## 2012-08-02 NOTE — Interval H&P Note (Signed)
History and Physical Interval Note:  08/02/2012 8:35 AM  Desiree [name]  has presented today for surgery, with the diagnosis of Anterior Pelvic Floor Prolapse  The various methods of treatment have been discussed with the patient and family. After consideration of risks, benefits and other options for treatment, the patient has consented to  Procedure(s) (LRB) with comments: ANTERIOR REPAIR (CYSTOCELE) (N/A) - [employer] Uphold Anterior Pelvic Floor Sacrospinus Repair as a surgical intervention .  The patient's history has been reviewed, patient examined, no change in status, stable for surgery.  I have reviewed the patient's chart and labs.  Questions were answered to the patient's satisfaction.     [name] I

## 2012-08-02 NOTE — Anesthesia Preprocedure Evaluation (Signed)
Anesthesia Evaluation  Patient identified by MRN, date of birth, ID band Patient awake    Reviewed: Allergy & Precautions, H&P , NPO status , Patient's Chart, lab work & pertinent test results  Airway Mallampati: II TM Distance: >3 FB Neck ROM: Full    Dental No notable dental hx.    Pulmonary neg pulmonary ROS,  breath sounds clear to auscultation  Pulmonary exam normal       Cardiovascular hypertension, Pt. on medications - Peripheral Vascular Disease negative cardio ROS  Rhythm:Regular Rate:Normal  Thoracic ascending aortic aneurysm. Stable 4cm   Neuro/Psych CVA negative neurological ROS  negative psych ROS   GI/Hepatic negative GI ROS, Neg liver ROS,   Endo/Other  negative endocrine ROS  Renal/GU negative Renal ROS  negative genitourinary   Musculoskeletal negative musculoskeletal ROS (+)   Abdominal   Peds negative pediatric ROS (+)  Hematology negative hematology ROS (+)   Anesthesia Other Findings   Reproductive/Obstetrics negative OB ROS                           Anesthesia Physical Anesthesia Plan  ASA: III  Anesthesia Plan: General   Post-op Pain Management:    Induction: Intravenous  Airway Management Planned: LMA  Additional Equipment:   Intra-op Plan:   Post-operative Plan:   Informed Consent: I have reviewed the patients History and Physical, chart, labs and discussed the procedure including the risks, benefits and alternatives for the proposed anesthesia with the patient or authorized representative who has indicated his/her understanding and acceptance.   Dental advisory given  Plan Discussed with: CRNA  Anesthesia Plan Comments:         Anesthesia Quick Evaluation

## 2012-08-03 ENCOUNTER — Encounter (HOSPITAL_BASED_OUTPATIENT_CLINIC_OR_DEPARTMENT_OTHER): Payer: Self-pay | Admitting: Urology

## 2012-08-03 MED ORDER — CIPROFLOXACIN HCL 500 MG PO TABS: 500.0000 mg | ORAL_TABLET | Freq: Two times a day (BID) | ORAL | Status: DC

## 2012-08-03 MED ORDER — TAPENTADOL HCL 100 MG PO TABS: 100.0000 mg | ORAL_TABLET | Freq: Four times a day (QID) | ORAL | Status: DC | PRN

## 2012-08-03 NOTE — Progress Notes (Signed)
Foley cath d/c'd. Vaginal packing removed No active bleeding noted.

## 2012-10-06 ENCOUNTER — Ambulatory Visit: Payer: Medicare Other | Admitting: Family Medicine

## 2012-10-06 ENCOUNTER — Other Ambulatory Visit: Payer: Self-pay | Admitting: Family Medicine

## 2012-10-12 ENCOUNTER — Ambulatory Visit (INDEPENDENT_AMBULATORY_CARE_PROVIDER_SITE_OTHER): Payer: Medicare Other | Admitting: Family Medicine

## 2012-10-12 ENCOUNTER — Encounter: Payer: Self-pay | Admitting: Family Medicine

## 2012-10-12 VITALS — BP 114/72 | HR 86 | Resp 18 | Ht 61.5 in | Wt 182.0 lb

## 2012-10-12 DIAGNOSIS — I10 Essential (primary) hypertension: Secondary | ICD-10-CM

## 2012-10-12 DIAGNOSIS — R7301 Impaired fasting glucose: Secondary | ICD-10-CM

## 2012-10-12 DIAGNOSIS — I712 Thoracic aortic aneurysm, without rupture, unspecified: Secondary | ICD-10-CM | POA: Insufficient documentation

## 2012-10-12 DIAGNOSIS — E785 Hyperlipidemia, unspecified: Secondary | ICD-10-CM

## 2012-10-12 DIAGNOSIS — E669 Obesity, unspecified: Secondary | ICD-10-CM

## 2012-10-12 DIAGNOSIS — N329 Bladder disorder, unspecified: Secondary | ICD-10-CM

## 2012-10-12 NOTE — Patient Instructions (Addendum)
Pelvic and breast in 4.5 month   Fasting lipid, cmp and hBa1C in the next week   It is important that you exercise regularly at least 30 minutes 5 times a week. If you develop chest pain, have severe difficulty breathing, or feel very tired, stop exercising immediately and seek medical attention    A healthy diet is rich in fruit, vegetables and whole grains. Poultry fish, nuts and beans are a healthy choice for protein rather then red meat. A low sodium diet and drinking 64 ounces of water daily is generally recommended. Oils and sweet should be limited. Carbohydrates especially for those who are diabetic or overweight, should be limited to 30-45 gram per meal. It is important to eat on a regular schedule, at least 3 times daily. Snacks should be primarily fruits, vegetables or nuts.  Congrats on weight loss   Please sign for office note from cardiologist  2 weeks ago

## 2012-10-17 DIAGNOSIS — Z6839 Body mass index (BMI) 39.0-39.9, adult: Secondary | ICD-10-CM | POA: Insufficient documentation

## 2012-10-17 LAB — COMPREHENSIVE METABOLIC PANEL
AST: 24 U/L (ref 0–37)
Albumin: 4 g/dL (ref 3.5–5.2)
BUN: 14 mg/dL (ref 6–23)
Calcium: 9.7 mg/dL (ref 8.4–10.5)
Chloride: 101 mEq/L (ref 96–112)
Glucose, Bld: 100 mg/dL — ABNORMAL HIGH (ref 70–99)
Potassium: 4 mEq/L (ref 3.5–5.3)
Sodium: 140 mEq/L (ref 135–145)
Total Protein: 6.7 g/dL (ref 6.0–8.3)

## 2012-10-17 LAB — LIPID PANEL: Cholesterol: 161 mg/dL (ref 0–200)

## 2012-10-17 LAB — HEMOGLOBIN A1C: Mean Plasma Glucose: 114 mg/dL (ref <117)

## 2012-10-17 NOTE — Assessment & Plan Note (Signed)
Improved. Pt applauded on succesful weight loss through lifestyle change, and encouraged to continue same. Weight loss goal set for the next several months.  

## 2012-10-17 NOTE — Progress Notes (Signed)
  Subjective:    Patient ID: Desiree [name], female    DOB: [birth year], 67 y.o.   MRN: [id number]  HPI  The PT is here for follow up and re-evaluation of chronic medical conditions, medication management and review of any available recent lab and radiology data.  Preventive health is updated, specifically  Cancer screening and Immunization.   Questions or concerns regarding consultations or procedures which the PT has had in the interim are  addressed. The PT denies any adverse reactions to current medications since the last visit.  There are no new concerns.  There are no specific complaints      Review of Systems See HPI Denies recent fever or chills. Denies sinus pressure, nasal congestion, ear pain or sore throat. Denies chest congestion, productive cough or wheezing. Denies chest pains, palpitations and leg swelling Denies abdominal pain, nausea, vomiting,diarrhea or constipation.   Denies dysuria, frequency, hesitancy or incontinence. Denies joint pain, swelling and limitation in mobility. Denies headaches, seizures, numbness, or tingling. Denies depression, anxiety or insomnia. Denies skin break down or rash.        Objective:   Physical Exam  Patient alert and oriented and in no cardiopulmonary distress.  HEENT: No facial asymmetry, EOMI, no sinus tenderness,  oropharynx pink and moist.  Neck supple no adenopathy.  Chest: Clear to auscultation bilaterally.  CVS: S1, S2 no murmurs, no S3.  ABD: Soft non tender. Bowel sounds normal.  Ext: No edema  MS: Adequate ROM spine, shoulders, hips and knees.  Skin: Intact, no ulcerations or rash noted.  Psych: Good eye contact, normal affect. Memory intact not anxious or depressed appearing.  CNS: CN 2-12 intact, power, tone and sensation normal throughout.       Assessment & Plan:

## 2012-10-17 NOTE — Assessment & Plan Note (Signed)
Improved with normoglycemi this last check. Pt applauaded on this and encouraged to continue same Patient educated about the importance of limiting  Carbohydrate intake , the need to commit to daily physical activity for a minimum of 30 minutes , and to commit weight loss. The fact that changes in all these areas will reduce or eliminate all together the development of diabetes is stressed.

## 2012-10-17 NOTE — Assessment & Plan Note (Signed)
Has had surgery , still symptomatic , my need further intervention

## 2012-10-17 NOTE — Assessment & Plan Note (Signed)
Controlled, no change in medication Hyperlipidemia:Low fat diet discussed and encouraged.  \ 

## 2012-10-17 NOTE — Assessment & Plan Note (Signed)
Followed by vascular 

## 2012-10-17 NOTE — Assessment & Plan Note (Signed)
Controlled, no change in medication DASH diet and commitment to daily physical activity for a minimum of 30 minutes discussed and encouraged, as a part of hypertension management. The importance of attaining a healthy weight is also discussed.  

## 2012-11-18 ENCOUNTER — Other Ambulatory Visit: Payer: Self-pay | Admitting: Family Medicine

## 2012-11-18 DIAGNOSIS — Z139 Encounter for screening, unspecified: Secondary | ICD-10-CM

## 2012-12-10 ENCOUNTER — Ambulatory Visit (HOSPITAL_COMMUNITY): Payer: Self-pay

## 2012-12-13 ENCOUNTER — Ambulatory Visit (HOSPITAL_COMMUNITY)
Admission: RE | Admit: 2012-12-13 | Discharge: 2012-12-13 | Disposition: A | Discharge status: Home / Self Care | Payer: Medicare Other | Source: Ambulatory Visit | Attending: Family Medicine | Admitting: Family Medicine

## 2012-12-13 DIAGNOSIS — Z1231 Encounter for screening mammogram for malignant neoplasm of breast: Secondary | ICD-10-CM | POA: Insufficient documentation

## 2012-12-13 DIAGNOSIS — Z139 Encounter for screening, unspecified: Secondary | ICD-10-CM

## 2012-12-30 ENCOUNTER — Other Ambulatory Visit: Payer: Self-pay | Admitting: Family Medicine

## 2012-12-30 ENCOUNTER — Telehealth: Payer: Self-pay | Admitting: Family Medicine

## 2012-12-30 MED ORDER — IBUPROFEN 800 MG PO TABS: 800.0000 mg | ORAL_TABLET | Freq: Three times a day (TID) | ORAL | Status: DC | PRN

## 2012-12-30 NOTE — Telephone Encounter (Signed)
Sent pls let her know  

## 2012-12-30 NOTE — Telephone Encounter (Signed)
Having knee pain and wants ibuprofen 800 sent into the pharmacy.

## 2013-02-21 ENCOUNTER — Other Ambulatory Visit (HOSPITAL_COMMUNITY)
Admission: RE | Admit: 2013-02-21 | Discharge: 2013-02-21 | Disposition: A | Discharge status: Home / Self Care | Payer: Medicare Other | Source: Ambulatory Visit | Attending: Family Medicine | Admitting: Family Medicine

## 2013-02-21 ENCOUNTER — Ambulatory Visit (INDEPENDENT_AMBULATORY_CARE_PROVIDER_SITE_OTHER): Payer: Medicare Other | Admitting: Family Medicine

## 2013-02-21 ENCOUNTER — Encounter: Payer: Self-pay | Admitting: Family Medicine

## 2013-02-21 VITALS — BP 136/78 | HR 80 | Resp 18 | Ht 61.5 in | Wt 181.1 lb

## 2013-02-21 DIAGNOSIS — Z0000 Encounter for general adult medical examination without abnormal findings: Secondary | ICD-10-CM | POA: Insufficient documentation

## 2013-02-21 DIAGNOSIS — R5381 Other malaise: Secondary | ICD-10-CM

## 2013-02-21 DIAGNOSIS — M1711 Unilateral primary osteoarthritis, right knee: Secondary | ICD-10-CM | POA: Insufficient documentation

## 2013-02-21 DIAGNOSIS — R5383 Other fatigue: Secondary | ICD-10-CM

## 2013-02-21 DIAGNOSIS — Z1212 Encounter for screening for malignant neoplasm of rectum: Secondary | ICD-10-CM

## 2013-02-21 DIAGNOSIS — Z01419 Encounter for gynecological examination (general) (routine) without abnormal findings: Secondary | ICD-10-CM

## 2013-02-21 DIAGNOSIS — M1710 Unilateral primary osteoarthritis, unspecified knee: Secondary | ICD-10-CM

## 2013-02-21 DIAGNOSIS — Z1211 Encounter for screening for malignant neoplasm of colon: Secondary | ICD-10-CM

## 2013-02-21 DIAGNOSIS — E785 Hyperlipidemia, unspecified: Secondary | ICD-10-CM

## 2013-02-21 DIAGNOSIS — Z7982 Long term (current) use of aspirin: Secondary | ICD-10-CM

## 2013-02-21 DIAGNOSIS — Z1151 Encounter for screening for human papillomavirus (HPV): Secondary | ICD-10-CM | POA: Insufficient documentation

## 2013-02-21 LAB — POC HEMOCCULT BLD/STL (OFFICE/1-CARD/DIAGNOSTIC)

## 2013-02-21 MED ORDER — TRAMADOL HCL 50 MG PO TABS: ORAL_TABLET | ORAL | Status: DC

## 2013-02-21 NOTE — Patient Instructions (Addendum)
F/u mid to end November  Pls call if you need me before  Fasting lipid, cmp and TSH in November before visit   STOP ibuprofen as discussed for arthritis pain in right knee  Please continue aspirin 325mg  daily for stroke risk reduction  New is tramadol 1 at bedtime , as needed, for uncontrolled pain. AS WE DISCUSSED, if you have any adverse s/e from the tramadol , do not take a 2nd tablet, you mention hallucination with codeine, and this may have the same effect. Call and let me know if there is a problem  Use the tramadol no more than once or twice weekly pls, like you were doing with ibuprofen  I recommend you have orthopedics evaluate the right knee due to report of instability, so pls call with name of ortho you want to check this out, once you decide to follow through. The risk of a fall with an uunstable knee is high, and we do NOT want that to happen!

## 2013-02-22 MED ORDER — HYDROCHLOROTHIAZIDE 25 MG PO TABS: ORAL_TABLET | ORAL | Status: DC

## 2013-02-22 MED ORDER — LOVASTATIN 40 MG PO TABS: 40.0000 mg | ORAL_TABLET | Freq: Every day | ORAL | Status: DC

## 2013-02-27 NOTE — Progress Notes (Signed)
  Subjective:    Patient ID: Desiree [name], female    DOB: July 09, 1946, 67 y.o.   MRN: [id number]  HPI Pt in for pelvic and breast exam She has c/o uncontrolled right  knee pain with some instability, holding off on ortho opinion at this time, denies any falls  Has questions as to the safety of aspirin and ibuprofen which are addresed   Review of Systems See HPI Denies recent fever or chills. Denies sinus pressure, nasal congestion, ear pain or sore throat. Denies chest congestion, productive cough or wheezing. Denies chest pains, palpitations and leg swelling Denies abdominal pain, nausea, vomiting,diarrhea or constipation.   Denies dysuria, frequency, hesitancy or incontinence.  Denies headaches, seizures, numbness, or tingling. Denies depression, anxiety or insomnia. Denies skin break down or rash.        Objective:   Physical Exam  Pleasant well nourished female, alert and oriented x 3, in no cardio-pulmonary distress. Afebrile. HEENT No facial trauma or asymetry. Sinuses non tender.  EOMI, PERTL, fundoscopic exam is normal, no hemorhage or exudate.  External ears normal, tympanic membranes clear. Oropharynx moist, no exudate, good dentition. Neck: supple, no adenopathy,JVD or thyromegaly.No bruits.  Chest: Clear to ascultation bilaterally.No crackles or wheezes. Non tender to palpation  Breast: No asymetry,no masses. No nipple discharge or inversion. No axillary or supraclavicular adenopathy. No skin lesions visible on either breast  Cardiovascular system; Heart sounds normal,  S1 and  S2 ,no S3.  No murmur, or thrill. Apical beat not displaced Peripheral pulses normal.  Abdomen: Soft, non tender, no organomegaly or masses. No bruits. Bowel sounds normal. No guarding, tenderness or rebound.  Rectal:  No mass. Guaiac negative stool.  GU: External genitalia normal. No lesions. Vaginal canal normal.No discharge. Uterus absent, no adnexal masses, no   adnexal tenderness.  Musculoskeletal exam: Full ROM of spine, hips , shoulders and  Reduced in right  Knee, which has swelling and crepitus . No muscle wasting or atrophy.   Neurologic: Cranial nerves 2 to 12 intact. Power, tone ,sensation and reflexes normal throughout. No disturbance in gait. No tremor.  Skin: Intact, no ulceration, erythema , scaling or rash noted. Pigmentation normal throughout  Psych; Normal mood and affect. Judgement and concentration normal       Assessment & Plan:

## 2013-02-27 NOTE — Assessment & Plan Note (Signed)
Pelvic and breast exam completed as documented. Pt to continue to work on healthy food choice and regular physical activity for good health Immunization is up to date, as is routine cancer screening

## 2013-05-10 ENCOUNTER — Other Ambulatory Visit: Payer: Self-pay | Admitting: Family Medicine

## 2013-06-06 ENCOUNTER — Ambulatory Visit (HOSPITAL_COMMUNITY): Payer: Medicare Other

## 2013-06-06 ENCOUNTER — Encounter: Payer: Self-pay | Admitting: Family Medicine

## 2013-06-06 ENCOUNTER — Ambulatory Visit (HOSPITAL_COMMUNITY)
Admission: RE | Admit: 2013-06-06 | Discharge: 2013-06-06 | Disposition: A | Discharge status: Home / Self Care | Payer: Medicare Other | Source: Ambulatory Visit | Attending: Family Medicine | Admitting: Family Medicine

## 2013-06-06 ENCOUNTER — Ambulatory Visit (INDEPENDENT_AMBULATORY_CARE_PROVIDER_SITE_OTHER): Payer: Medicare Other | Admitting: Family Medicine

## 2013-06-06 VITALS — BP 134/80 | HR 64 | Resp 18 | Ht 61.5 in | Wt 187.0 lb

## 2013-06-06 DIAGNOSIS — I712 Thoracic aortic aneurysm, without rupture, unspecified: Secondary | ICD-10-CM

## 2013-06-06 DIAGNOSIS — M546 Pain in thoracic spine: Secondary | ICD-10-CM

## 2013-06-06 DIAGNOSIS — I10 Essential (primary) hypertension: Secondary | ICD-10-CM

## 2013-06-06 DIAGNOSIS — M5030 Other cervical disc degeneration, unspecified cervical region: Secondary | ICD-10-CM | POA: Insufficient documentation

## 2013-06-06 DIAGNOSIS — Q765 Cervical rib: Secondary | ICD-10-CM | POA: Insufficient documentation

## 2013-06-06 DIAGNOSIS — M542 Cervicalgia: Secondary | ICD-10-CM

## 2013-06-06 DIAGNOSIS — IMO0002 Reserved for concepts with insufficient information to code with codable children: Secondary | ICD-10-CM | POA: Insufficient documentation

## 2013-06-06 MED ORDER — PREDNISONE (PAK) 5 MG PO TABS: 5.0000 mg | ORAL_TABLET | ORAL | Status: DC

## 2013-06-06 MED ORDER — GABAPENTIN 100 MG PO CAPS: ORAL_CAPSULE | ORAL | Status: DC

## 2013-06-06 MED ORDER — KETOROLAC TROMETHAMINE 60 MG/2ML IJ SOLN
60.0000 mg | Freq: Once | INTRAMUSCULAR | Status: AC
  Administered 2013-06-06: 60 mg via INTRAMUSCULAR

## 2013-06-06 MED ORDER — METHYLPREDNISOLONE ACETATE 80 MG/ML IJ SUSP
80.0000 mg | Freq: Once | INTRAMUSCULAR | Status: AC
  Administered 2013-06-06: 80 mg via INTRAMUSCULAR

## 2013-06-06 MED ORDER — OMEPRAZOLE 20 MG PO CPDR: 20.0000 mg | DELAYED_RELEASE_CAPSULE | Freq: Every day | ORAL | Status: DC

## 2013-06-06 MED ORDER — IBUPROFEN 800 MG PO TABS: 800.0000 mg | ORAL_TABLET | Freq: Three times a day (TID) | ORAL | Status: DC | PRN

## 2013-06-06 NOTE — Patient Instructions (Addendum)
F/u as before  Toradol 60mg  IM and depo medrol 80 mg IM in office today for neck and right  upper arm pain, and mid back pain. Starting tonight take gabapentin 1 capsule, increase up to 3 capsules at bedtime by Friday, if tolerated, this is good for nerve pain Tomorrow take ibuprofen 1 three times daily and start the prednisone as prescribed for 6 days tomorrow Omeprazole 1 daily for 1 month prescribed to protect you from heartburn  Work excuse from 10/13 to return 06/13/2013   Call by midweek if no better, xrays of neck and mid back today please You are referred for a chest scan and angiogram to re evaluate the aneurysm in your chest , this  is very important.  Chem 7 today

## 2013-06-06 NOTE — Progress Notes (Signed)
  Subjective:    Patient ID: Desiree [name], female    DOB: Dec 18, 1945, 67 y.o.   MRN: [id number]  HPI The PT is here for acute visit due to uncontrolled pain which is relatively new  Preventive health is updated, specifically  Cancer screening and Immunization.   1 week h/o mid back pain radiaiting to left flank, worse with twisting upper body, was 10 now 9, has had this before post op pain pill gave temporary relief but she" felt foolish" when she took the tablet  Right neck pain radiating to elbow, pain is at 11 when she moves the arm, had neck surgery in 2007, progressively worsening in past 2 month, denies weakness or numbness in upper extremity No other complaints. Wants to hold on flu vaccine at this time      Review of Systems See HPI Denies recent fever or chills. Denies sinus pressure, nasal congestion, ear pain or sore throat. Denies chest congestion, productive cough or wheezing. Denies PND, orthopnea, palpitations and leg swelling Denies abdominal pain, nausea, vomiting,diarrhea or constipation.   Denies dysuria, frequency, hesitancy or incontinence.   Denies depression, anxiety or insomnia. Denies skin break down or rash.         Objective:   Physical Exam  Patient alert and oriented and in no cardiopulmonary distress.Pt in oain  HEENT: No facial asymmetry, EOMI, no sinus tenderness,  oropharynx pink and moist.  Neck supple no adenopathy.  Chest: Clear to auscultation bilaterally.  CVS: S1, S2 no murmurs, no S3.  ABD: Soft non tender. Bowel sounds normal.  Ext: No edema  MS: decreased ROM thoracic spine on twisting esp to thje left, full flexion and extension present of spine. Full ROM c spine, however limitation in ROM RUE due to pain radiating from neck to just proximal to right elbow  Skin: Intact, no ulcerations or rash noted.  Psych: Good eye contact, normal affect. Memory intact not anxious or depressed appearing.  CNS: CN 2-12 intact, power,  tone and sensation normal throughout.       Assessment & Plan:

## 2013-06-06 NOTE — Assessment & Plan Note (Signed)
Controlled, no change in medication  

## 2013-06-07 LAB — BASIC METABOLIC PANEL
BUN: 18 mg/dL (ref 6–23)
Calcium: 10 mg/dL (ref 8.4–10.5)
Glucose, Bld: 85 mg/dL (ref 70–99)

## 2013-06-08 ENCOUNTER — Ambulatory Visit (HOSPITAL_COMMUNITY)
Admission: RE | Admit: 2013-06-08 | Discharge: 2013-06-08 | Disposition: A | Discharge status: Home / Self Care | Payer: Medicare Other | Source: Ambulatory Visit | Attending: Family Medicine | Admitting: Family Medicine

## 2013-06-08 DIAGNOSIS — I712 Thoracic aortic aneurysm, without rupture, unspecified: Secondary | ICD-10-CM

## 2013-06-08 MED ORDER — IOHEXOL 350 MG/ML SOLN
100.0000 mL | Freq: Once | INTRAVENOUS | Status: AC | PRN
  Administered 2013-06-08: 100 mL via INTRAVENOUS

## 2013-06-09 ENCOUNTER — Other Ambulatory Visit: Payer: Self-pay | Admitting: Family Medicine

## 2013-06-09 DIAGNOSIS — I712 Thoracic aortic aneurysm, without rupture, unspecified: Secondary | ICD-10-CM

## 2013-06-10 ENCOUNTER — Telehealth: Payer: Self-pay | Admitting: Family Medicine

## 2013-06-10 NOTE — Telephone Encounter (Signed)
Spoke with Cardiovascular and Thoracic Surgery they will make an appointment and call me back patient is aware of this

## 2013-06-10 NOTE — Telephone Encounter (Signed)
Spoke with patient regarding the results of her CT/angio and she is aware. Desiree [name] to schedule

## 2013-06-12 NOTE — Assessment & Plan Note (Signed)
Repeat scan ordered for f/u, will also refer to cardiothoracic surgery to follow

## 2013-06-12 NOTE — Assessment & Plan Note (Signed)
Acute flare, anti inflammatories in high doses

## 2013-06-12 NOTE — Assessment & Plan Note (Signed)
Severe and disabling x 1 week, has established disc disease in neck, has had surgery in past for this Anti inflamatories in office and at home, to call back if no relief

## 2013-06-13 ENCOUNTER — Other Ambulatory Visit: Payer: Self-pay | Admitting: *Deleted

## 2013-06-13 ENCOUNTER — Other Ambulatory Visit: Payer: Self-pay

## 2013-06-13 DIAGNOSIS — M542 Cervicalgia: Secondary | ICD-10-CM

## 2013-06-13 MED ORDER — CARVEDILOL 3.125 MG PO TABS: 3.1250 mg | ORAL_TABLET | Freq: Two times a day (BID) | ORAL | Status: DC

## 2013-06-13 MED ORDER — OMEPRAZOLE 20 MG PO CPDR: 20.0000 mg | DELAYED_RELEASE_CAPSULE | Freq: Every day | ORAL | Status: DC

## 2013-06-14 ENCOUNTER — Encounter: Payer: Medicare Other | Admitting: Cardiothoracic Surgery

## 2013-06-15 ENCOUNTER — Institutional Professional Consult (permissible substitution) (INDEPENDENT_AMBULATORY_CARE_PROVIDER_SITE_OTHER): Payer: Medicare Other | Admitting: Cardiothoracic Surgery

## 2013-06-15 ENCOUNTER — Other Ambulatory Visit: Payer: Self-pay | Admitting: *Deleted

## 2013-06-15 ENCOUNTER — Encounter: Payer: Self-pay | Admitting: Cardiothoracic Surgery

## 2013-06-15 VITALS — BP 147/88 | HR 57 | Resp 16 | Ht 61.5 in | Wt 183.0 lb

## 2013-06-15 DIAGNOSIS — I712 Thoracic aortic aneurysm, without rupture, unspecified: Secondary | ICD-10-CM

## 2013-06-15 DIAGNOSIS — I359 Nonrheumatic aortic valve disorder, unspecified: Secondary | ICD-10-CM

## 2013-06-15 NOTE — Progress Notes (Signed)
[address]       [place] [address]             [phone]                    Desiree [name] [facility] Medical Record #[id number] Date of Birth: March 02, 1946  Referring: Kerri Perches, MD Primary Care: Syliva Overman, MD Cardiology: Dr  Ruby Cola  Chief Complaint:    Chief Complaint  Patient presents with  . TAA    eval and treat.Marland KitchenMarland KitchenCTA CHEST 06/08/13    History of Present Illness:    Patient is a 67 year old female who has had a known dilated descending aorta since at least 2009 with serial CT scans of the chest having been performed. She is now referred to cardiac surgery after a recent CT scan done this month. The patient was seen with right neck and shoulder pain since completely resolved. In the evaluation of this a repeat CT scan was performed to compare with previous scans. She is referred because of a 4.4 cm dilated ascending aorta. She has no known previous history of coronary artery disease. The last echo I have a record of this in 2009. She notes that she has been told that she had a stroke based on scans but does not know when this was or what the symptoms were if any.    Previous CT scans are reviewed with descending aorta 4.3 cm 2009 4.6 x 4.26 January 2009, 4.1 x 4.5 08/27/2009, January 2012 4.1 cm, on my measurement this appears unchanged from 2009.   Current Activity/ Functional Status:  Patient is independent with mobility/ambulation, transfers, ADL's, IADL's.  Zubrod Score: At the time of surgery this patient's most appropriate activity status/level should be described as: []  Normal activity, no symptoms [x]  Symptoms, fully ambulatory []  Symptoms, in bed less than or equal to 50% of the time []  Symptoms, in bed greater than 50% of the time but less than 100% []  Bedridden []  Moribund   Past Medical History  Diagnosis Date  . Hyperlipidemia   . Hypertension   . History of CVA (cerebrovascular accident) PER SCAN IN 2011  .  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 Allergic rhinitis   . Vaginal wall prolapse   . Mixed urge and stress incontinence   . History of Bell's palsy 2011  RIGHT SIDE -- RESOLVED  . Thoracic ascending aortic aneurysm LAST CHEST CT 09-02-2010--  FOLLOWED BY  CARDIOLOGIST--  DR WUXLKGMW  . Nocturia   . Sinus drainage   . Heart murmur ASYMPTOMATIC  . Impaired fasting glucose PER PCP  DR SIMPSON    WATCH DIET  . GERD (gastroesophageal reflux disease) OCCASIONAL  . Arthritis HIPS/ WRISTS  . DDD (degenerative disc disease), cervical     Past Surgical History  Procedure Laterality Date  . Total abdominal hysterectomy w/ bilateral salpingoophorectomy  1990  . Neck surgery  2007  . Carpal tunnel release    . Cervical disc surgery  12-12-2005  DR STERN    LEFT  C6 - C7 HERINATED / DDD/ SPONDYLOSIS  . Right partial mastectomy  11-11-2004  DR PETER YOUNG    DUCTAL CARCINOMA IN SITU RIGHT BREAST  . Cardiac catheterization  02-01-2009  DR [facility]    NORMAL CORONARY ARTERIES/ NORMAL LV SIZE AND FUNCTION/ AORTIC ROOT SIZE AT THE UPPER LIMIT OF  NORMAL   . Transthoracic echocardiogram  03-30-2008  DR MARGARET SIMPSON    LV SIZE AND FUNCTION NORMAL/ MODERATE AORTIC ARCH DILATATION/ MILD MR  . Bilateral carpal tunnel release  1980'S  . Cystocele repair  08/02/2012    Procedure: ANTERIOR REPAIR (CYSTOCELE);  Surgeon: Kathi Ludwig, MD;  Location: [facility];  Service: Urology;  Laterality: N/A;  [employer] Uphold Anterior Pelvic Floor Sacrospinus Repair. Anterior wall of the vagina.    Family History  Problem Relation Age of Onset  . Heart attack Mother 38  . Kidney failure Mother   . Hypertension Mother   . Throat cancer Father 71  . Prostate cancer Father     History   Social History  . Marital Status: Divorced    Spouse Name: N/A    Number of Children: 4  . Years of Education: N/A    Occupational History  . Not on file.   Social History Main Topics  . Smoking status: Never Smoker   . Smokeless tobacco: Never Used  . Alcohol Use: No  . Drug Use: No  . Sexual Activity: Not on file   Other Topics Concern  . 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 Never Smoker   Smokeless tobacco  . Never Used    History  Alcohol Use No     Allergies  Allergen Reactions  . Codeine Other (See Comments)    CONFUSION/ DIZZY    Current Outpatient Prescriptions  Medication Sig Dispense Refill  . aspirin EC 325 MG tablet Take 1 tablet (325 mg total) by mouth daily.  100 tablet  3  . calcium-vitamin D (OSCAL 500/200 D-3) 500-200 MG-UNIT per tablet Take 1 tablet by mouth daily.       . carvedilol (COREG) 3.125 MG tablet Take 1 tablet (3.125 mg total) by mouth 2 (two) times daily with a meal.  180 tablet  1  . Diphenhydramine-PSE-APAP (ALLERGY/SINUS HEADACHE PO) Take 1 tablet by mouth daily as needed. allergies      . gabapentin (NEURONTIN) 100 MG capsule Three capsules at bedtime for pain in arm and back  90 capsule  2  . hydrochlorothiazide (HYDRODIURIL) 25 MG tablet TAKE  ONE TABLET BY MOUTH EVERY MORNING FOR FLUID RETENTION  90 tablet  0  . ibuprofen (ADVIL,MOTRIN) 800 MG tablet Take 1 tablet (800 mg total) by mouth every 8 (eight) hours as needed for pain.  30 tablet  0  . losartan (COZAAR) 50 MG tablet Take 1 tablet (50 mg total) by mouth daily.  30 tablet  11  . lovastatin (MEVACOR) 40 MG tablet Take 1 tablet (40 mg total) by mouth at bedtime. TAKE 1 TABLET (40 MG TOTAL) BY MOUTH AT BEDTIME.  30 tablet  4  . omeprazole (PRILOSEC) 20 MG capsule Take 1 capsule (20 mg total) by mouth daily.  30 capsule  5  . fesoterodine (TOVIAZ) 4 MG TB24 Take 4 mg by mouth daily.      . [DISCONTINUED] amLODipine (NORVASC) 10 MG tablet Take 1 tablet (10 mg total) by mouth daily.  90 tablet  1  . [DISCONTINUED] potassium  chloride SA (KLOR-CON M20) 20 MEQ tablet Take 1 tablet (20 mEq total) by mouth daily.  60 tablet  1   No current facility-administered medications for this visit.       Review of Systems:  Cardiac Review of Systems: Y or N  Chest Pain [  n  ]  Resting SOB [n   ] Exertional SOB  Cove.Etienne  ]  Orthopnea Milo.Brash  ]   Pedal Edema [ n  ]    Palpitations [ y ] Syncope  [ n ]   Presyncope [n  ]  General Review of Systems: [Y] = yes [  ]=no Constitional: recent weight change [n  ]; anorexia [n  ]; fatigue Cove.Etienne  ]; nausea [ n ]; night sweats [n  ]; fever [ n ]; or chills [n  ];                                                                                                                                          Dental: poor dentition[ n ]; Last Dentist visit: every 6 months  Eye : blurred vision [ n ]; diplopia [ n  ]; vision changes [ n];  Amaurosis fugax[  n]; Resp: cough [n  ];  wheezing[n ];  hemoptysis[ n ]; shortness of breath[ n ]; paroxysmal nocturnal dyspnea[  n]; dyspnea on exertion[ y ]; or orthopnea[ n ];  GI:  gallstones[n  ], vomiting[  ];  dysphagia[  ]; melena[  n];  hematochezia [  ]; heartburn[  ];   Hx of  Colonoscopy[y  ]; GU: kidney stones [  ]; hematuria[  ];   dysuria [  ];  nocturia[ n ];  history of     obstruction [ n ]; urinary frequency [ n ]             Skin: rash, swelling[  ];, hair loss[  ];  peripheral edema[  ];  or itching[  ]; Musculosketetal: myalgias[  ];  joint swelling[  ];  joint erythema[  ];  joint pain[  ];  back pain[  ];  Heme/Lymph: bruising[n  ];  bleeding[  ];  anemia[  ];  Neuro: TIA[ n ];  headaches[  ];  stroke[n  ];  vertigo[  ];  seizures[  ];   paresthesias[  ];  difficulty walking[  ];  Psych:depression[  ]; anxiety[  ];  Endocrine: diabetes[ n ];  thyroid dysfunction[n  ];  Immunizations: Flu [ next week ]; Pneumococcal[ y ];  Other:  Physical Exam: BP 147/88  Pulse 57  Resp 16  Ht 5' 1.5" (1.562 m)  Wt 183 lb (83.008 kg)  BMI 34.02 kg/m2   SpO2 98%  General appearance: alert, cooperative, appears stated age and no distress Neurologic: intact Heart: regular rate and rhythm, S1, S2 normal, no murmur, click, rub or gallop and normal apical impulse Lungs: clear to auscultation bilaterally and normal percussion bilaterally Abdomen: soft, non-tender; bowel sounds normal; no masses,  no organomegaly Extremities: extremities normal, atraumatic, no cyanosis or edema and Homans sign is negative, no sign of DVT Prominent right  carotid pulse no carotid bruits DP and PT pulses are palpable bilaterally no cervical supraclavicular adenopathy Dg Cervical Spine Complete  Diagnostic Studies & Laboratory data:     Recent Radiology Findings: 06/06/2013   CLINICAL DATA:  Neck pain with right-sided radicular symptoms  EXAM: CERVICAL SPINE  4+ VIEWS  COMPARISON:  None.  FINDINGS: Frontal, lateral, open-mouth odontoid, and bilateral oblique views were obtained. There is no fracture or spondylolisthesis. Prevertebral soft tissues and predental space regions are normal.  There is fairly marked disc space narrowing at C4-5. There is moderate disc space narrowing at C7-T1. There is mild disc space narrowing at C3-4. There is the exit foraminal narrowing at C4-5 due to the facet hypertrophy.  There is a small cervical rib on the right.  IMPRESSION: Multilevel osteoarthritic change. No fracture or spondylolisthesis. Small cervical rib on the right.   Electronically Signed   By: Bretta Bang M.D.   On: 06/06/2013 21:19   Dg Thoracic Spine W/swimmers  06/06/2013   CLINICAL DATA:  Pain with radicular symptoms  EXAM: THORACIC SPINE - 2 VIEW + SWIMMERS  COMPARISON:  None.  FINDINGS: Frontal, lateral, and swimmer's views were obtained. There is no fracture or spondylolisthesis. There the is multilevel disc space narrowing with several small osteophytes consistent with osteoarthritic change. No erosive change.  IMPRESSION: Multilevel osteoarthritic change. No  fracture or spondylolisthesis.   Electronically Signed   By: Bretta Bang M.D.   On: 06/06/2013 21:20   Ct Angio Chest Aorta W/cm &/or Wo/cm  06/09/2013   CLINICAL DATA:  Aneurysm  EXAM: CT ANGIOGRAPHY CHEST WITH CONTRAST  TECHNIQUE: Multidetector CT imaging of the chest was performed using the standard protocol during bolus administration of intravenous contrast. Multiplanar CT image reconstructions including MIPs were obtained to evaluate the vascular anatomy.  CONTRAST:  OMNIPAQUE IOHEXOL 350 MG/ML SOLN  COMPARISON:  09/02/2010  FINDINGS: Maximal aortic diameter at the sinus of Valsalva, sino-tubular junction, and ascending aorta are 3.4 cm, 2.9 cm, and 4.5 cm respectively. Previously, the maximal diameter in the ascending aorta was 4.2 cm. No evidence of dissection.  Origins of the great vessels are patent. Right subclavian and common carotid arteries are patent. Visualized vertebral arteries are patent.  No obvious filling defect in the pulmonary arterial tree to suggest acute pulmonary thromboembolism.  No abnormal mediastinal adenopathy or pericardial effusion.  Scattered linear atelectasis in the lungs without consolidation or mass.  Degenerative disc disease within the thoracic spine. No compression deformity. Stable liver cyst.  Review of the MIP images confirms the above findings.  IMPRESSION: Ascending aortic aneurysm has increased from 4.2 cm to 4.5 cm in maximal diameter.   Electronically Signed   By: Maryclare Bean M.D.   On: 06/09/2013 07:39    ECHO: 03/30/2008: SUMMARY - Overall left ventricular systolic function was normal. There were no left ventricular regional wall motion abnormalities. There was moderate asymmetric septal hypertrophy. - The AV leaflets themselves appear normal with normal opening. However, there are increased velocities and gradients. I do not appreciate a subvalvular membrane and there does not appear to be LVOT obstruction from ASH. Consider TEE or cardiac  CT if clinically indicated for enhanced visualization. The mean transaortic valve gradient was 11 mmHg. Estimated aortic valve area (by VTI) was 1.74 cm^2. Estimated aortic valve area (by Vmax) was 1.72 cm^2. - There appears to be a linear density in the prox asc aorta without definitive color flow appreciated on doppler in this area. This is not  seen in all views. Additionally, the prox asc aorta is mild to mod dilated in this region. Recommend TEE or cardiac/chest CT to eval for poss aneurysm and/or dissection if clinically indicated. There was moderate aortic arch dilatation. - There was mild mitral valvular regurgitation. The effective orifice of mitral regurgitation by proximal isovelocity surface area was 0.21 cm^2. The volume of mitral regurgitation by proximal isovelocity surface area was 26 cc. - The estimated peak right ventricular systolic pressure was mildly increased.   Recent Lab Findings: Lab Results  Component Value Date   WBC 3.5* 03/08/2011   HGB 13.9 08/02/2012   HCT 41.0 08/02/2012   PLT 287 03/08/2011   GLUCOSE 85 06/06/2013   CHOL 161 10/12/2012   TRIG 44 10/12/2012   HDL 69 10/12/2012   LDLCALC 83 10/12/2012   ALT 11 10/12/2012   AST 24 10/12/2012   NA 138 06/06/2013   K 3.7 06/06/2013   CL 101 06/06/2013   CREATININE 1.21* 06/06/2013   BUN 18 06/06/2013   CO2 30 06/06/2013   TSH 2.824 02/25/2012   HGBA1C 5.6 10/12/2012   Chronic Kidney Disease   Stage I     GFR >90  Stage II    GFR 60-89  Stage IIIA GFR 45-59  Stage IIIB GFR 30-44  Stage IV   GFR 15-29  Stage V    GFR  <15  Lab Results  Component Value Date   CREATININE 1.21* 06/06/2013   Estimated Creatinine Clearance: 45.2 ml/min (by C-G formula based on Cr of 1.21).  Aortic Size Index=     4.5 cm    /Body surface area is 1.90 meters squared. 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 With  stability since 2009  and at the most 4.5 cm in size I have recommended to the patient continued observation with good blood pressure control including beta blocker. Since she has not had an echocardiogram since 2009 we will arrange for this to be done. I'll plan to see her back in one year. With follow up CTA of chest    Delight Ovens MD      [address] [place].[address] [place] [address] Office [phone]   Beeper [phone]

## 2013-06-15 NOTE — Patient Instructions (Signed)
Thoracic Aortic Aneurysm An aneurysm is the enlargement (dilatation), bulging or ballooning out of part of the wall of a vein or artery. An Aortic Aneurysm is a bulging in the largest artery of the body. This artery supplies blood from the heart to the rest of the body. The first part of the aorta is called the thoracic aorta. It leaves the heart, ascends (rises), arches, and descends (goes down) through the chest until it reaches the diaphragm (the muscular partition between the chest and abdomen (belly). The second part of the aorta is then called the abdominal aorta after it has passed the diaphragm and continues down through the abdomen. The abdominal aorta ends where it splits to form the two iliac arteries that go to the legs. Aortic aneurysms can develop anywhere along the length of the aorta. A thoracic aortic aneurysm (TAA) occurs in the first part of the aorta, between the heart and the diaphragm. The major importance of an aneurysm is that it can rupture or tear (dissect), causing death unless diagnosed and treated promptly. CAUSES  Most thoracic aortic aneurysms are related to arteriosclerosis. The arteriosclerosis can weaken the aortic wall. The pressure of the blood being pumped through the aorta causes it to balloon out at the site of weakness. Therefore, elevated blood pressure (hypertension) is associated with aneurysm. Other risk factors include:  Age over 60.  Tobacco use.  Female sex.  Family history of aneurysm. Additional causes of thoracic aortic aneurysms include:  Genetics (passed by birth).  Injury: After physical trauma to the aorta.  Inflammation of blood vessels.  Hardening of the arteries.  Infection. SYMPTOMS  Many aneurysms do not cause problems. A small, unchanging or slowly changing aneurysm may produce no symptoms until it suddenly ruptures or dissects (separation of the layers of the aortic wall) without warning. It may then cause death. The symptoms  (problems) of a developing aneurysm will partly depend on its size and rate of growth. Thoracic aortic aneurysms may cause pain in the:  Chest.  Back.  Sides.  Abdomen. The pain most often has a deep quality as if it is boring into the person. It may cause:  Heart failure.  Heart attack.  Hoarseness.  Cough.  Shortness of breath.  Swallowing problems. DIAGNOSIS  A thoracic aortic aneurysm may be suspected based on your symptoms. It may also be detected by x-ray or CT studies done for unrelated reasons.  Several different imaging studies can be used to confirm a TAA:  An echocardiogram is an ultrasound test to examine the heart. It can also examine the first parts of the aorta. Sometimes, this test is done by putting you to sleep and inserting a flexible telescope through your mouth into your esophagus, which is next to the aorta; excellent pictures of the aorta can be obtained. This is called a transesophageal echocardiogram (TEE).  CT scanning of the chest is accurate at showing the exact size and shape of the aneurysm.  MRI scanning is accurate, and is used for certain types of TAA.  An aortic angiogram shows the source of the major blood vessels arising from the aorta. It reveals the size and extent of any aneurysm. It can also show a clot clinging to the wall of the aneurysm (mural thrombus). The angiogram may give information about a tear of the aorta. TREATMENT  Treatment for a thoracic aortic aneurysm depends on:  Location.  Size.  Other factors.  Rate of growth.  Underlying cause. Medical treatment is used   for smaller or complicated aneurysms, or those that do not cause symptoms. These include:  Stopping smoking.  Blood pressure control.  Control of cholesterol. Surgical treatment is used for aneurysms that cause symptoms, or for those that are large or growing in size. The surgical technique depends on the location of the aneurysm. HOME CARE INSTRUCTIONS     If you smoke, stop. If you do not, do not start.  Take all medications as prescribed.  Your caregiver will tell you when to have your aneurysm rechecked, either by echocardiogram or CT scan. Be sure to keep this and all follow-up appointments. SEEK MEDICAL CARE IF:   You develop mild pain in your chest, upper back, sides, or abdomen.  You develop cough, hoarseness or trouble swallowing. SEEK IMMEDIATE MEDICAL CARE IF:   You develop severe chest or abdominal pain, or severe pain moving (radiating) to your back.  You suddenly develop cold or blue toes or feet.  You suddenly develop lightheadedness or fainting spells.  You develop trouble breathing. Document Released: 08/11/2005 Document Revised: 11/03/2011 Document Reviewed: 06/30/2007 [facility] Patient Information 2014 [place], [place].

## 2013-06-29 ENCOUNTER — Ambulatory Visit (HOSPITAL_COMMUNITY)
Admission: RE | Admit: 2013-06-29 | Discharge: 2013-06-29 | Disposition: A | Discharge status: Home / Self Care | Payer: Medicare Other | Source: Ambulatory Visit | Attending: Cardiothoracic Surgery | Admitting: Cardiothoracic Surgery

## 2013-06-29 DIAGNOSIS — I359 Nonrheumatic aortic valve disorder, unspecified: Secondary | ICD-10-CM

## 2013-06-29 NOTE — Progress Notes (Signed)
2D Echo Performed 09/08/2012    Desiree [name], RCS  

## 2013-07-05 ENCOUNTER — Ambulatory Visit: Payer: Self-pay | Admitting: Cardiovascular Disease

## 2013-07-09 LAB — COMPREHENSIVE METABOLIC PANEL
ALT: 13 U/L (ref 0–35)
CO2: 31 mEq/L (ref 19–32)
Calcium: 9.3 mg/dL (ref 8.4–10.5)
Chloride: 102 mEq/L (ref 96–112)
Glucose, Bld: 86 mg/dL (ref 70–99)
Sodium: 139 mEq/L (ref 135–145)
Total Bilirubin: 0.6 mg/dL (ref 0.3–1.2)
Total Protein: 6.9 g/dL (ref 6.0–8.3)

## 2013-07-09 LAB — TSH: TSH: 2.919 u[IU]/mL (ref 0.350–4.500)

## 2013-07-09 LAB — LIPID PANEL
HDL: 78 mg/dL (ref >39)
Triglycerides: 37 mg/dL (ref <150)

## 2013-07-11 ENCOUNTER — Ambulatory Visit (INDEPENDENT_AMBULATORY_CARE_PROVIDER_SITE_OTHER): Payer: Medicare Other | Admitting: Cardiovascular Disease

## 2013-07-11 ENCOUNTER — Encounter: Payer: Self-pay | Admitting: Cardiovascular Disease

## 2013-07-11 VITALS — BP 117/71 | HR 55 | Resp 16 | Ht 61.5 in | Wt 185.3 lb

## 2013-07-11 DIAGNOSIS — I712 Thoracic aortic aneurysm, without rupture, unspecified: Secondary | ICD-10-CM

## 2013-07-11 DIAGNOSIS — E785 Hyperlipidemia, unspecified: Secondary | ICD-10-CM

## 2013-07-11 DIAGNOSIS — I10 Essential (primary) hypertension: Secondary | ICD-10-CM

## 2013-07-11 NOTE — Patient Instructions (Signed)
Your physician recommends that you schedule a follow-up appointment in: 1 YEAR. No changes were made today in your therapy. 

## 2013-07-13 ENCOUNTER — Ambulatory Visit (INDEPENDENT_AMBULATORY_CARE_PROVIDER_SITE_OTHER): Payer: Medicare Other | Admitting: Family Medicine

## 2013-07-13 ENCOUNTER — Encounter: Payer: Self-pay | Admitting: Family Medicine

## 2013-07-13 VITALS — BP 114/68 | HR 70 | Resp 18 | Ht 61.5 in | Wt 185.1 lb

## 2013-07-13 DIAGNOSIS — R7301 Impaired fasting glucose: Secondary | ICD-10-CM

## 2013-07-13 DIAGNOSIS — E785 Hyperlipidemia, unspecified: Secondary | ICD-10-CM

## 2013-07-13 DIAGNOSIS — Z23 Encounter for immunization: Secondary | ICD-10-CM

## 2013-07-13 DIAGNOSIS — I10 Essential (primary) hypertension: Secondary | ICD-10-CM

## 2013-07-13 DIAGNOSIS — M542 Cervicalgia: Secondary | ICD-10-CM

## 2013-07-13 DIAGNOSIS — M546 Pain in thoracic spine: Secondary | ICD-10-CM

## 2013-07-13 MED ORDER — GABAPENTIN 100 MG PO CAPS: ORAL_CAPSULE | ORAL | Status: DC

## 2013-07-13 NOTE — Patient Instructions (Addendum)
F/U in 4.5 month, call if  You need me before  Flu vaccine today   Happy that you are much better.  We will send labs to cardiologist per his request. Cholesterol and liver are great  Cut back on gabapentin if able   NON fasting chem 7 and EGFr, CBC, hBA1C  In 4 month, before visit  Bone density test will be scheduled next year

## 2013-07-13 NOTE — Progress Notes (Signed)
  Subjective:    Patient ID: Desiree [name], female    DOB: 06/28/1946, 67 y.o.   MRN: [id number]  HPI The PT is here for follow up and re-evaluation of chronic medical conditions, medication management and review of any available recent lab and radiology data.  Preventive health is updated, specifically  Cancer screening and Immunization.   Questions or concerns regarding consultations or procedures which the PT has had in the interim are  Addressed.Recently was evaluated by cardiothoracic re aortic aneurysm, will f/iu anualy unless symptoms change The PT denies any adverse reactions to current medications since the last visit.  States that the acute thoracic pain which she had at last visit is much improved, she does still use gabapentin for relief and is encouraged to cut back as able   Review of Systems See HPI Denies recent fever or chills. Denies sinus pressure, nasal congestion, ear pain or sore throat. Denies chest congestion, productive cough or wheezing. Denies chest pains, palpitations and leg swelling Denies abdominal pain, nausea, vomiting,diarrhea or constipation.   Denies dysuria, frequency, hesitancy or incontinence. Denies joint pain, swelling and limitation in mobility. Denies headaches, seizures, numbness, or tingling. Denies depression, anxiety or insomnia. Denies skin break down or rash.        Objective:   Physical Exam Patient alert and oriented and in no cardiopulmonary distress.  HEENT: No facial asymmetry, EOMI, no sinus tenderness,  oropharynx pink and moist.  Neck supple no adenopathy.  Chest: Clear to auscultation bilaterally.  CVS: S1, S2 no murmurs, no S3.  ABD: Soft non tender. Bowel sounds normal.  Ext: No edema  MS: Adequate ROM spine, shoulders, hips and knees.  Skin: Intact, no ulcerations or rash noted.  Psych: Good eye contact, normal affect. Memory intact not anxious or depressed appearing.  CNS: CN 2-12 intact, power, tone and  sensation normal throughout.        Assessment & Plan:

## 2013-07-18 ENCOUNTER — Encounter: Payer: Self-pay | Admitting: Cardiovascular Disease

## 2013-07-18 NOTE — Assessment & Plan Note (Signed)
Relatively small aneurysm that is probably HTN related, not growing at a fast pace. Continue beta blockers and ARB as key components of BP control. Avoid isometric intense exercise, but otherwise encourage regular moderate, isotonic/aerobic exercise.

## 2013-07-18 NOTE — Progress Notes (Signed)
Patient ID: Desiree [name], female   DOB: 1946/04/03, 67 y.o.   MRN: [id number]      Reason for office visit HTN, DTAA  She was recently seen by Dr. Tyrone Sage to discuss surgery for a moderate size aneurysm of the ascending thoracic aorta (stable at 4.1-4.5 cm over last 5 years) and he suggested a repeat echo. This shows a normal LV (normal size, thickness, EF and diastolic function), normal aortic valve and mild MR. No comment was made on the aortic root.  She has a long history of severe HTN, recently with excellent control. She does not have a family history of sudden death or aneurysm rupture. She is essentially asymptomatic. - vague occasional R chest pain sounds musculoskeletal. [facility]  Allergies  Allergen Reactions  . Codeine Other (See Comments)    CONFUSION/ DIZZY    Current Outpatient Prescriptions  Medication Sig Dispense Refill  . aspirin EC 325 MG tablet Take 1 tablet (325 mg total) by mouth daily.  100 tablet  3  . calcium-vitamin D (OSCAL 500/200 D-3) 500-200 MG-UNIT per tablet Take 1 tablet by mouth daily.       . carvedilol (COREG) 3.125 MG tablet Take 1 tablet (3.125 mg total) by mouth 2 (two) times daily with a meal.  180 tablet  1  . hydrochlorothiazide (HYDRODIURIL) 25 MG tablet TAKE  ONE TABLET BY MOUTH EVERY MORNING FOR FLUID RETENTION  90 tablet  0  . losartan (COZAAR) 50 MG tablet Take 1 tablet (50 mg total) by mouth daily.  30 tablet  11  . lovastatin (MEVACOR) 40 MG tablet Take 1 tablet (40 mg total) by mouth at bedtime. TAKE 1 TABLET (40 MG TOTAL) BY MOUTH AT BEDTIME.  30 tablet  4  . gabapentin (NEURONTIN) 100 MG capsule Two capsules at bedtime , as needed, for nerve pain  60 capsule  5  . [DISCONTINUED] amLODipine (NORVASC) 10 MG tablet Take 1 tablet (10 mg total) by mouth daily.  90 tablet  1  . [DISCONTINUED] potassium chloride SA (KLOR-CON M20) 20 MEQ tablet Take 1 tablet (20 mEq total) by mouth daily.  60 tablet  1   No current  facility-administered medications for this visit.    Past Medical History  Diagnosis Date  . Hyperlipidemia   . Hypertension   . History of CVA (cerebrovascular accident) PER SCAN IN 2011  . 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 Allergic rhinitis   . Vaginal wall prolapse   . Mixed urge and stress incontinence   . History of Bell's palsy 2011  RIGHT SIDE -- RESOLVED  . Thoracic ascending aortic aneurysm LAST CHEST CT 09-02-2010--  FOLLOWED BY  CARDIOLOGIST--  DR [id number]  . Nocturia   . Sinus drainage   . Heart murmur ASYMPTOMATIC  . Impaired fasting glucose PER PCP  DR SIMPSON    WATCH DIET  . GERD (gastroesophageal reflux disease) OCCASIONAL  . Arthritis HIPS/ WRISTS  . DDD (degenerative disc disease), cervical     Past Surgical History  Procedure Laterality Date  . Total abdominal hysterectomy w/ bilateral salpingoophorectomy  1990  . Neck surgery  2007  . Carpal tunnel release    . Cervical disc surgery  12-12-2005  DR STERN    LEFT  C6 - C7 HERINATED / DDD/ SPONDYLOSIS  . Right partial mastectomy  11-11-2004  DR Francina Ames  DUCTAL CARCINOMA IN SITU RIGHT BREAST  . Cardiac catheterization  02-01-2009  DR [facility]    NORMAL CORONARY ARTERIES/ NORMAL LV SIZE AND FUNCTION/ AORTIC ROOT SIZE AT THE UPPER LIMIT OF NORMAL   . Transthoracic echocardiogram  03-30-2008  DR MARGARET SIMPSON    LV SIZE AND FUNCTION NORMAL/ MODERATE AORTIC ARCH DILATATION/ MILD MR  . Bilateral carpal tunnel release  1980'S  . Cystocele repair  08/02/2012    Procedure: ANTERIOR REPAIR (CYSTOCELE);  Surgeon: Kathi Ludwig, MD;  Location: [facility];  Service: Urology;  Laterality: N/A;  [employer] Uphold Anterior Pelvic Floor Sacrospinus Repair. Anterior wall of the vagina.    Family History  Problem Relation Age of Onset  . Heart attack Mother   . Kidney failure Mother   . Hypertension Mother    . Throat cancer Father   . Prostate cancer Father     History   Social History  . Marital Status: Divorced    Spouse Name: N/A    Number of Children: 4  . Years of Education: N/A   Occupational History  . Not on file.   Social History Main Topics  . Smoking status: Never Smoker   . Smokeless tobacco: Never Used  . Alcohol Use: No  . Drug Use: No  . Sexual Activity: Not on file   Other Topics Concern  . Not on file   Social History Narrative  . No narrative on file    Review of systems: The patient specifically denies any chest painwith exertion, dyspnea at rest or with exertion, orthopnea, paroxysmal nocturnal dyspnea, syncope, palpitations, focal neurological deficits, intermittent claudication, lower extremity edema, unexplained weight gain, cough, hemoptysis or wheezing.  The patient also denies abdominal pain, nausea, vomiting, dysphagia, diarrhea, constipation, polyuria, polydipsia, dysuria, hematuria, frequency, urgency, abnormal bleeding or bruising, fever, chills, unexpected weight changes, mood swings, change in skin or hair texture, change in voice quality, auditory or visual problems, allergic reactions or rashes, new musculoskeletal complaints other than usual "aches and pains".   PHYSICAL EXAM BP 117/71  Pulse 55  Resp 16  Ht 5' 1.5" (1.562 m)  Wt 185 lb 4.8 oz (84.052 kg)  BMI 34.45 kg/m2  General: Alert, oriented x3, no distress Head: no evidence of trauma, PERRL, EOMI, no exophtalmos or lid lag, no myxedema, no xanthelasma; normal ears, nose and oropharynx Neck: normal jugular venous pulsations and no hepatojugular reflux; brisk carotid pulses without delay and no carotid bruits Chest: clear to auscultation, no signs of consolidation by percussion or palpation, normal fremitus, symmetrical and full respiratory excursions Cardiovascular: normal position and quality of the apical impulse, regular rhythm, normal first and second heart sounds, no murmurs,  rubs or gallops Abdomen: no tenderness or distention, no masses by palpation, no abnormal pulsatility or arterial bruits, normal bowel sounds, no hepatosplenomegaly Extremities: no clubbing, cyanosis or edema; 2+ radial, ulnar and brachial pulses bilaterally; 2+ right femoral, posterior tibial and dorsalis pedis pulses; 2+ left femoral, posterior tibial and dorsalis pedis pulses; no subclavian or femoral bruits Neurological: grossly nonfocal   EKG: Mild sinus bradycardia, otherwise normal.  Lipid Panel     Component Value Date/Time   CHOL 180 07/09/2013 0822   TRIG 37 07/09/2013 0822   HDL 78 07/09/2013 0822   CHOLHDL 2.3 07/09/2013 0822   VLDL 7 07/09/2013 0822   LDLCALC 95 07/09/2013 0822    BMET    Component Value Date/Time   NA 139 07/09/2013 0822   K 3.7  07/09/2013 0822   CL 102 07/09/2013 0822   CO2 31 07/09/2013 0822   GLUCOSE 86 07/09/2013 0822   BUN 20 07/09/2013 0822   CREATININE 1.17* 07/09/2013 0822   CREATININE 1.08 09/14/2010 2018   CALCIUM 9.3 07/09/2013 0822   GFRNONAA 50* 11/03/2007 0910   GFRAA  Value: >60        The eGFR has been calculated using the MDRD equation. This calculation has not been validated in all clinical 11/03/2007 0910     ASSESSMENT AND PLAN HYPERTENSION Excellent control of severe BP.  Thoracic aortic aneurysm Relatively small aneurysm that is probably HTN related, not growing at a fast pace. Continue beta blockers and ARB as key components of BP control. Avoid isometric intense exercise, but otherwise encourage regular moderate, isotonic/aerobic exercise.   Patient Instructions  Your physician recommends that you schedule a follow-up appointment in: 1 YEAR. No changes were made today in your therapy.  Repeat CTA in one year  Orders Placed This Encounter  Procedures  . EKG 12-Lead   Meds ordered this encounter  Medications  . DISCONTD: gabapentin (NEURONTIN) 100 MG capsule    Sig: Take 200 mg by mouth at bedtime. Three  capsules at bedtime for pain in arm and back  . omeprazole (PRILOSEC) 20 MG capsule    Sig: Take 20 mg by mouth daily as needed.    Junious Silk, MD, [facility] [phone] office [phone] pager

## 2013-07-18 NOTE — Assessment & Plan Note (Signed)
Good lipid parameters

## 2013-07-18 NOTE — Assessment & Plan Note (Signed)
Excellent control of severe BP.

## 2013-07-19 ENCOUNTER — Encounter: Payer: Self-pay | Admitting: Cardiovascular Disease

## 2013-07-27 ENCOUNTER — Other Ambulatory Visit: Payer: Self-pay

## 2013-07-27 MED ORDER — LOVASTATIN 40 MG PO TABS: 40.0000 mg | ORAL_TABLET | Freq: Every day | ORAL | Status: DC

## 2013-07-27 MED ORDER — HYDROCHLOROTHIAZIDE 25 MG PO TABS: ORAL_TABLET | ORAL | Status: DC

## 2013-08-01 ENCOUNTER — Telehealth: Payer: Self-pay | Admitting: Cardiovascular Disease

## 2013-08-01 ENCOUNTER — Other Ambulatory Visit: Payer: Self-pay

## 2013-08-01 ENCOUNTER — Telehealth: Payer: Self-pay | Admitting: Family Medicine

## 2013-08-01 DIAGNOSIS — M542 Cervicalgia: Secondary | ICD-10-CM

## 2013-08-01 MED ORDER — IBUPROFEN 800 MG PO TABS: 800.0000 mg | ORAL_TABLET | Freq: Three times a day (TID) | ORAL | Status: AC | PRN

## 2013-08-01 MED ORDER — HYDROCHLOROTHIAZIDE 25 MG PO TABS: ORAL_TABLET | ORAL | Status: DC

## 2013-08-01 MED ORDER — LOVASTATIN 40 MG PO TABS: 40.0000 mg | ORAL_TABLET | Freq: Every day | ORAL | Status: DC

## 2013-08-01 NOTE — Telephone Encounter (Signed)
Need her medicine transferred from [facility]  To [facility] [phone].

## 2013-08-01 NOTE — Telephone Encounter (Signed)
Error - disregard message.  New msg created.

## 2013-08-01 NOTE — Telephone Encounter (Signed)
Called patient. Stated needs refills of carvedilol 3.125mg  bid sent to [facility] pharmacy in [place]. When chart was reviewed, refills were sent in on 06/13/13 - called pharmacy to confirm e-script had been received and to notify that patient needed them filled.

## 2013-08-01 NOTE — Telephone Encounter (Signed)
Patient also needs Hydrochlorithiazide (?).

## 2013-08-01 NOTE — Telephone Encounter (Signed)
meds refilled 

## 2013-08-14 NOTE — Assessment & Plan Note (Signed)
Marked improvement with aggressive anti inflammatory management

## 2013-08-14 NOTE — Assessment & Plan Note (Signed)
Marked improvement, ongoing ned for gbapentin currently , but encouraged to reduce dependnce

## 2013-08-14 NOTE — Assessment & Plan Note (Signed)
Controlled, no change in medication DASH diet and commitment to daily physical activity for a minimum of 30 minutes discussed and encouraged, as a part of hypertension management. The importance of attaining a healthy weight is also discussed.  

## 2013-08-14 NOTE — Assessment & Plan Note (Signed)
Controlled, no change in medication Hyperlipidemia:Low fat diet discussed and encouraged.  \ 

## 2013-10-10 ENCOUNTER — Telehealth: Payer: Self-pay

## 2013-10-10 ENCOUNTER — Encounter (INDEPENDENT_AMBULATORY_CARE_PROVIDER_SITE_OTHER): Payer: Self-pay

## 2013-10-10 ENCOUNTER — Ambulatory Visit (INDEPENDENT_AMBULATORY_CARE_PROVIDER_SITE_OTHER): Payer: Medicare HMO | Admitting: Family Medicine

## 2013-10-10 ENCOUNTER — Encounter: Payer: Self-pay | Admitting: Family Medicine

## 2013-10-10 VITALS — BP 128/76 | HR 70 | Temp 98.7°F | Resp 18 | Ht 61.5 in | Wt 193.0 lb

## 2013-10-10 DIAGNOSIS — J309 Allergic rhinitis, unspecified: Secondary | ICD-10-CM

## 2013-10-10 DIAGNOSIS — J208 Acute bronchitis due to other specified organisms: Principal | ICD-10-CM

## 2013-10-10 DIAGNOSIS — B9689 Other specified bacterial agents as the cause of diseases classified elsewhere: Secondary | ICD-10-CM

## 2013-10-10 DIAGNOSIS — J209 Acute bronchitis, unspecified: Secondary | ICD-10-CM

## 2013-10-10 DIAGNOSIS — I10 Essential (primary) hypertension: Secondary | ICD-10-CM

## 2013-10-10 DIAGNOSIS — A499 Bacterial infection, unspecified: Secondary | ICD-10-CM

## 2013-10-10 MED ORDER — AZITHROMYCIN 250 MG PO TABS: ORAL_TABLET | ORAL | Status: DC

## 2013-10-10 MED ORDER — BENZONATATE 100 MG PO CAPS: 100.0000 mg | ORAL_CAPSULE | Freq: Two times a day (BID) | ORAL | Status: DC | PRN

## 2013-10-10 MED ORDER — PROMETHAZINE-DM 6.25-15 MG/5ML PO SYRP: ORAL_SOLUTION | ORAL | Status: DC

## 2013-10-10 MED ORDER — CHLORPHENIRAMINE TANNATE 8 MG PO TABS: ORAL_TABLET | ORAL | Status: DC

## 2013-10-10 NOTE — Progress Notes (Signed)
   Subjective:    Patient ID: Desiree [name], female    DOB: Jun 08, 1946, 68 y.o.   MRN: [id number]  HPI Had no  fever , but has had chills, cough from last week Tuesday, worked all week up until last Friday with these symptoms [place] Wednesday she had loose stools, no vomit.  Excessive head  Congestion, post nasal drainage, has  been gargling, experiencing generalized aches, still coughing, sputum is thick,thck nasal drainage and headache, no sore throat , or ear pain. Sent home from work today   Review of Systems See HPI  Denies chest pains, palpitations and leg swelling  Denies dysuria, frequency, hesitancy or incontinence. Denies joint pain, swelling and limitation in mobility. Denies headaches, seizures, numbness, or tingling. Denies depression, anxiety or insomnia. Denies skin break down or rash.        Objective:   Physical Exam BP 128/76  Pulse 70  Temp(Src) 98.7 F (37.1 C)  Resp 18  Ht 5' 1.5" (1.562 m)  Wt 193 lb (87.544 kg)  BMI 35.88 kg/m2  SpO2 98%  Patient alert and oriented and in no cardiopulmonary distress.Ill appearing  HEENT: No facial asymmetry, EOMI, no sinus tenderness,  oropharynx pink and moist.  Neck supple no adenopathy.  Chest: decreased air entry, though adequate, no wheezes, scattered crackles CVS: S1, S2 no murmurs, no S3.  ABD: Soft non tender. Bowel sounds normal.  Ext: No edema  MS: Adequate ROM spine, shoulders, hips and knees.  Skin: Intact, no ulcerations or rash noted.  Psych: Good eye contact, normal affect. Memory intact not anxious or depressed appearing.  CNS: CN 2-12 intact, power, tone and sensation normal throughout.        Assessment & Plan:  Acute bacterial bronchitis Antidotic and decongestant prescribed.     HYPERTENSION Controlled, no change in medication

## 2013-10-10 NOTE — Telephone Encounter (Signed)
Noted and patient added to schedule.

## 2013-10-10 NOTE — Telephone Encounter (Signed)
Has to be seen this am as work in

## 2013-10-10 NOTE — Patient Instructions (Addendum)
F/u as before  You are being treated for acute bacterial bronchitis, 4 medications are prescribed   Work excuse from today, to return on Friday of this week  Call if you feel you ae getting worse or not totally better in 10 days  Acute Bronchitis Bronchitis is inflammation of the airways that extend from the windpipe into the lungs (bronchi). The inflammation often causes mucus to develop. This leads to a cough, which is the most common symptom of bronchitis.  In acute bronchitis, the condition usually develops suddenly and goes away over time, usually in a couple weeks. Smoking, allergies, and asthma can make bronchitis worse. Repeated episodes of bronchitis may cause further lung problems.  CAUSES Acute bronchitis is most often caused by the same virus that causes a cold. The virus can spread from person to person (contagious).  SIGNS AND SYMPTOMS   Cough.   Fever.   Coughing up mucus.   Body aches.   Chest congestion.   Chills.   Shortness of breath.   Sore throat.  DIAGNOSIS  Acute bronchitis is usually diagnosed through a physical exam. Tests, such as chest X-rays, are sometimes done to rule out other conditions.  TREATMENT  Acute bronchitis usually goes away in a couple weeks. Often times, no medical treatment is necessary. Medicines are sometimes given for relief of fever or cough. Antibiotics are usually not needed but may be prescribed in certain situations. In some cases, an inhaler may be recommended to help reduce shortness of breath and control the cough. A cool mist vaporizer may also be used to help thin bronchial secretions and make it easier to clear the chest.  HOME CARE INSTRUCTIONS  Get plenty of rest.   Drink enough fluids to keep your urine clear or pale yellow (unless you have a medical condition that requires fluid restriction). Increasing fluids may help thin your secretions and will prevent dehydration.   Only take over-the-counter or  prescription medicines as directed by your health care provider.   Avoid smoking and secondhand smoke. Exposure to cigarette smoke or irritating chemicals will make bronchitis worse. If you are a smoker, consider using nicotine gum or skin patches to help control withdrawal symptoms. Quitting smoking will help your lungs heal faster.   Reduce the chances of another bout of acute bronchitis by washing your hands frequently, avoiding people with cold symptoms, and trying not to touch your hands to your mouth, nose, or eyes.   Follow up with your health care provider as directed.  SEEK MEDICAL CARE IF: Your symptoms do not improve after 1 week of treatment.  SEEK IMMEDIATE MEDICAL CARE IF:  You develop an increased fever or chills.   You have chest pain.   You have severe shortness of breath.  You have bloody sputum.   You develop dehydration.  You develop fainting.  You develop repeated vomiting.  You develop a severe headache. MAKE SURE YOU:   Understand these instructions.  Will watch your condition.  Will get help right away if you are not doing well or get worse. Document Released: 09/18/2004 Document Revised: 04/13/2013 Document Reviewed: 02/01/2013 [facility] Patient Information 2014 [place].

## 2013-10-11 NOTE — Assessment & Plan Note (Signed)
Controlled, no change in medication  

## 2013-10-11 NOTE — Assessment & Plan Note (Signed)
Antidotic and decongestant prescribed.   

## 2013-10-27 ENCOUNTER — Other Ambulatory Visit: Payer: Self-pay | Admitting: Family Medicine

## 2013-11-26 ENCOUNTER — Other Ambulatory Visit: Payer: Self-pay | Admitting: Family Medicine

## 2013-11-26 LAB — HEMOGLOBIN A1C
Hgb A1c MFr Bld: 5.7 % — ABNORMAL HIGH (ref <5.7)
Mean Plasma Glucose: 117 mg/dL — ABNORMAL HIGH (ref <117)

## 2013-11-26 LAB — COMPLETE METABOLIC PANEL WITH GFR
ALT: 16 U/L (ref 0–35)
AST: 25 U/L (ref 0–37)
Albumin: 3.9 g/dL (ref 3.5–5.2)
Alkaline Phosphatase: 76 U/L (ref 39–117)
BUN: 20 mg/dL (ref 6–23)
CO2: 29 mEq/L (ref 19–32)
Calcium: 9.3 mg/dL (ref 8.4–10.5)
Chloride: 101 mEq/L (ref 96–112)
Creat: 1.16 mg/dL — ABNORMAL HIGH (ref 0.50–1.10)
GFR, Est African American: 56 mL/min — ABNORMAL LOW
GFR, Est Non African American: 49 mL/min — ABNORMAL LOW
Glucose, Bld: 92 mg/dL (ref 70–99)
Potassium: 3.7 mEq/L (ref 3.5–5.3)
Sodium: 139 mEq/L (ref 135–145)
Total Bilirubin: 0.6 mg/dL (ref 0.2–1.2)
Total Protein: 7 g/dL (ref 6.0–8.3)

## 2013-11-26 LAB — CBC
HCT: 32.7 % — ABNORMAL LOW (ref 36.0–46.0)
Hemoglobin: 11 g/dL — ABNORMAL LOW (ref 12.0–15.0)
MCH: 28.4 pg (ref 26.0–34.0)
MCHC: 33.6 g/dL (ref 30.0–36.0)
MCV: 84.3 fL (ref 78.0–100.0)
Platelets: 323 10*3/uL (ref 150–400)
RBC: 3.88 MIL/uL (ref 3.87–5.11)
RDW: 15 % (ref 11.5–15.5)
WBC: 3.5 10*3/uL — ABNORMAL LOW (ref 4.0–10.5)

## 2013-11-29 ENCOUNTER — Encounter (INDEPENDENT_AMBULATORY_CARE_PROVIDER_SITE_OTHER): Payer: Self-pay

## 2013-11-29 ENCOUNTER — Encounter: Payer: Self-pay | Admitting: Family Medicine

## 2013-11-29 ENCOUNTER — Ambulatory Visit (INDEPENDENT_AMBULATORY_CARE_PROVIDER_SITE_OTHER): Payer: Medicare HMO | Admitting: Family Medicine

## 2013-11-29 VITALS — BP 116/70 | HR 70 | Resp 18 | Ht 61.5 in | Wt 191.1 lb

## 2013-11-29 DIAGNOSIS — E785 Hyperlipidemia, unspecified: Secondary | ICD-10-CM

## 2013-11-29 DIAGNOSIS — I10 Essential (primary) hypertension: Secondary | ICD-10-CM

## 2013-11-29 DIAGNOSIS — E669 Obesity, unspecified: Secondary | ICD-10-CM

## 2013-11-29 DIAGNOSIS — E8881 Metabolic syndrome: Secondary | ICD-10-CM

## 2013-11-29 DIAGNOSIS — N3000 Acute cystitis without hematuria: Secondary | ICD-10-CM

## 2013-11-29 DIAGNOSIS — D649 Anemia, unspecified: Secondary | ICD-10-CM | POA: Insufficient documentation

## 2013-11-29 DIAGNOSIS — R7301 Impaired fasting glucose: Secondary | ICD-10-CM

## 2013-11-29 DIAGNOSIS — Z13820 Encounter for screening for osteoporosis: Secondary | ICD-10-CM

## 2013-11-29 LAB — POCT URINALYSIS DIPSTICK
Bilirubin, UA: NEGATIVE
Blood, UA: NEGATIVE
Glucose, UA: NEGATIVE
Ketones, UA: NEGATIVE
LEUKOCYTES UA: NEGATIVE
NITRITE UA: NEGATIVE
PH UA: 5.5
PROTEIN UA: NEGATIVE
Spec Grav, UA: 1.015
Urobilinogen, UA: 0.2

## 2013-11-29 LAB — IRON: IRON: 46 ug/dL (ref 42–145)

## 2013-11-29 LAB — FERRITIN: Ferritin: 166 ng/mL (ref 10–291)

## 2013-11-29 NOTE — Patient Instructions (Signed)
F/u in 2 month, call if you need me before  Urine is being tested for infection we will let you know before you leave how it looks  You are referred to your GI Doc re anemia.  You are referred for  Bone density test which is due  Please schedule your mammogram due 4/22/or after  Remember to depend mainly on tylenol or topical rubs for arthritis pain

## 2013-11-29 NOTE — Progress Notes (Signed)
   Subjective:    Patient ID: Desiree [name], female    DOB: [birth year], 68 y.o.   MRN: [id number]  HPI  3 day h/o feeling drained, no energy but still carrying out usuual activities. No symptoms of specific infection site, does however note some pressure and increased urinary frequency  in the past 3 days Denies fever, has occasional chills, no flank pain , nausea or vomtting RHM issues are addressed and recent consultations reviewed with the patient  Review of Systems See HPI  Denies sinus pressure, nasal congestion, ear pain or sore throat. Denies chest congestion, productive cough or wheezing. Denies chest pains, palpitations and leg swelling Denies abdominal pain, nausea, vomiting,diarrhea or constipation.    C/o joint pain,  and limitation in mobility. Denies headaches, seizures, numbness, or tingling. Denies depression, anxiety or insomnia. Denies skin break down or rash.        Objective:   Physical Exam BP 116/70  Pulse 70  Resp 18  Ht 5' 1.5" (1.562 m)  Wt 191 lb 1.9 oz (86.691 kg)  BMI 35.53 kg/m2  SpO2 99% Patient alert and oriented and in no cardiopulmonary distress.  HEENT: No facial asymmetry, EOMI,   oropharynx pink and moist.  Neck supple no JVD, no mass.  Chest: Clear to auscultation bilaterally.  CVS: S1, S2 no murmurs, no S3.  ABD: Soft non tender.   Ext: No edema  MS: Adequate though reduced  ROM spine, shoulders, hips and knees.  Skin: Intact, no ulcerations or rash noted.  Psych: Good eye contact, normal affect. Memory intact not anxious or depressed appearing.  CNS: CN 2-12 intact, power,  normal throughout.no focal deficits noted.        Assessment & Plan:  HYPERTENSION Controlled, no change in medication   Impaired fasting glucose Patient educated about the importance of limiting  Carbohydrate intake , the need to commit to daily physical activity for a minimum of 30 minutes , and to commit weight loss. The fact that  changes in all these areas will reduce or eliminate all together the development of diabetes is stressed.     Anemia Anemia with normal iron, needs GI eval  HYPERLIPIDEMIA Needs updated lab, past due  Hyperlipidemia:Low fat diet discussed and encouraged.  No change in med at this time  Obesity, unspecified Deteriorated.11 pound weight gain in 6 month Patient re-educated about  the importance of commitment to a  minimum of 150 minutes of exercise per week. The importance of healthy food choices with portion control discussed. Encouraged to start a food diary, count calories and to consider  joining a support group. Sample diet sheets offered. Goals set by the patient for the next several months.     Metabolic syndrome X The increased risk of cardiovascular disease associated with this diagnosis, and the need to consistently work on lifestyle to change this is discussed. Following  a  heart healthy diet ,commitment to 30 minutes of exercise at least 5 days per week, as well as control of blood sugar and cholesterol , and achieving a healthy weight are all the areas to be addressed .   Acute cystitis Symptomatic, check UA and treat  Presumptively if abnormal

## 2013-12-05 ENCOUNTER — Encounter: Payer: Self-pay | Admitting: Gastroenterology

## 2013-12-12 ENCOUNTER — Other Ambulatory Visit (HOSPITAL_COMMUNITY): Payer: Self-pay

## 2013-12-12 ENCOUNTER — Ambulatory Visit (HOSPITAL_COMMUNITY)
Admission: RE | Admit: 2013-12-12 | Discharge: 2013-12-12 | Disposition: A | Discharge status: Home / Self Care | Payer: Medicare HMO | Source: Ambulatory Visit | Attending: Family Medicine | Admitting: Family Medicine

## 2013-12-12 ENCOUNTER — Encounter: Payer: Self-pay | Admitting: Family Medicine

## 2013-12-12 DIAGNOSIS — M8580 Other specified disorders of bone density and structure, unspecified site: Secondary | ICD-10-CM | POA: Insufficient documentation

## 2013-12-12 DIAGNOSIS — Z13820 Encounter for screening for osteoporosis: Secondary | ICD-10-CM | POA: Insufficient documentation

## 2013-12-14 ENCOUNTER — Other Ambulatory Visit: Payer: Self-pay | Admitting: Family Medicine

## 2013-12-14 DIAGNOSIS — Z1231 Encounter for screening mammogram for malignant neoplasm of breast: Secondary | ICD-10-CM

## 2013-12-23 ENCOUNTER — Ambulatory Visit (HOSPITAL_COMMUNITY)
Admission: RE | Admit: 2013-12-23 | Discharge: 2013-12-23 | Disposition: A | Discharge status: Home / Self Care | Payer: Medicare HMO | Source: Ambulatory Visit | Attending: Family Medicine | Admitting: Family Medicine

## 2013-12-23 DIAGNOSIS — Z1231 Encounter for screening mammogram for malignant neoplasm of breast: Secondary | ICD-10-CM

## 2014-01-05 ENCOUNTER — Ambulatory Visit: Payer: Self-pay | Admitting: Gastroenterology

## 2014-01-05 ENCOUNTER — Encounter: Payer: Self-pay | Admitting: Gastroenterology

## 2014-01-05 ENCOUNTER — Ambulatory Visit (INDEPENDENT_AMBULATORY_CARE_PROVIDER_SITE_OTHER): Payer: Medicare HMO | Admitting: Gastroenterology

## 2014-01-05 ENCOUNTER — Other Ambulatory Visit: Payer: Self-pay | Admitting: Gastroenterology

## 2014-01-05 ENCOUNTER — Encounter (INDEPENDENT_AMBULATORY_CARE_PROVIDER_SITE_OTHER): Payer: Self-pay

## 2014-01-05 VITALS — BP 137/73 | HR 55 | Temp 98.4°F | Ht 61.5 in | Wt 194.4 lb

## 2014-01-05 DIAGNOSIS — D649 Anemia, unspecified: Secondary | ICD-10-CM

## 2014-01-05 NOTE — Progress Notes (Signed)
Subjective:    Patient ID: Desiree [name], female    DOB: 1946/06/07, 68 y.o.   MRN: [id number]  Desiree Nakayama, MD  HPI  SENTFor eval due to normocytic anemia. BMs; REGULAR. RARE RIGHT SIDED ABD PAIN. GERD SX CONTROLLED MOST OF THE TIME. EAT RIGHT MOST OF THE TIME(TOMATOS). NO CHEST PAIN, SOB, WEIGHT LOSS, OR CHANGE IN BOWEL HABITS. Marland Kitchen PT DENIES FEVER, CHILLS, BRBPR, nausea, vomiting, melena, diarrhea, constipation, problems swallowing, problems with sedation, OR heartburn or indigestion.  Past Medical History  Diagnosis Date  . Hyperlipidemia   . Hypertension   . History of CVA (cerebrovascular accident) PER SCAN IN 2011  . 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 Allergic rhinitis   . Vaginal wall prolapse   . Mixed urge and stress incontinence   . History of Bell's palsy 2011  RIGHT SIDE -- RESOLVED  . Thoracic ascending aortic aneurysm LAST CHEST CT 09-02-2010--  FOLLOWED BY  CARDIOLOGIST--  DR [id number]  . Nocturia   . Sinus drainage   . Heart murmur ASYMPTOMATIC  . Impaired fasting glucose PER PCP  DR SIMPSON    WATCH DIET  . GERD (gastroesophageal reflux disease) OCCASIONAL  . Arthritis HIPS/ WRISTS  . DDD (degenerative disc disease), cervical    Past Surgical History  Procedure Laterality Date  . Total abdominal hysterectomy w/ bilateral salpingoophorectomy  1990  . Neck surgery  2007  . Carpal tunnel release    . Cervical disc surgery  12-12-2005  DR STERN    LEFT  C6 - C7 HERINATED / DDD/ SPONDYLOSIS  . Right partial mastectomy  11-11-2004  DR PETER YOUNG    DUCTAL CARCINOMA IN SITU RIGHT BREAST  . Cardiac catheterization  02-01-2009  DR [facility]    NORMAL CORONARY ARTERIES/ NORMAL LV SIZE AND FUNCTION/ AORTIC ROOT SIZE AT THE UPPER LIMIT OF NORMAL   . Transthoracic echocardiogram  03-30-2008  DR MARGARET SIMPSON    LV SIZE AND FUNCTION NORMAL/ MODERATE AORTIC ARCH DILATATION/ MILD MR  .  Bilateral carpal tunnel release  1980'S  . Cystocele repair  08/02/2012    Procedure: ANTERIOR REPAIR (CYSTOCELE);  Surgeon: Ailene Rud, MD;  Location: [facility];  Service: Urology;  Laterality: N/A;  [employer] Uphold Anterior Pelvic Floor Sacrospinus Repair. Anterior wall of the vagina.   Allergies  Allergen Reactions  . Codeine Other (See Comments)    CONFUSION/ DIZZY   Current Outpatient Prescriptions  Medication Sig Dispense Refill  . aspirin EC 325 MG tablet Take 1 tablet (325 mg total) by mouth daily.    . calcium-vitamin D (OSCAL 500/200 D-3) 500-200 MG-UNIT per tablet Take 1 tablet by mouth daily.     . carvedilol (COREG) 3.125 MG tablet Take 1 tablet (3.125 mg total) by mouth 2 (two) times daily with a meal.    . gabapentin (NEURONTIN) 100 MG capsule Two capsules at bedtime , as needed, for nerve pain    . hydrochlorothiazide (HYDRODIURIL) 25 MG tablet TAKE  ONE TABLET BY MOUTH EVERY MORNING FOR FLUID RETENTION    . losartan (COZAAR) 50 MG tablet Take 1 tablet (50 mg total) by mouth daily.    Marland Kitchen lovastatin (MEVACOR) 40 MG tablet Take 1 tablet (40 mg total) by mouth at bedtime. TAKE 1 TABLET (40 MG TOTAL) BY MOUTH AT BEDTIME.    Marland Kitchen omeprazole (PRILOSEC) 20 MG capsule Take 20 mg by mouth  daily.    .      .          Review of Systems PER HPI OTHERWISE ALL SYSTEMS ARE NEGATIVE.     Objective:   Physical Exam  Vitals reviewed. Constitutional: She is oriented to person, place, and time. She appears well-nourished. No distress.  HENT:  Head: Normocephalic and atraumatic.  Mouth/Throat: Oropharynx is clear and moist. No oropharyngeal exudate.  Eyes: Pupils are equal, round, and reactive to light. No scleral icterus.  Neck: Normal range of motion. Neck supple.  Cardiovascular: Normal rate and regular rhythm.   Murmur heard. Pulmonary/Chest: Effort normal and breath sounds normal. No respiratory distress.  Abdominal: Soft. Bowel sounds are  normal. She exhibits no distension. There is no tenderness.  Musculoskeletal: She exhibits no edema.  Lymphadenopathy:    She has no cervical adenopathy.  Neurological: She is alert and oriented to person, place, and time.  NO FOCAL DEFICITS   Psychiatric: She has a normal mood and affect.          Assessment & Plan:

## 2014-01-05 NOTE — Progress Notes (Signed)
Reminder in epic °

## 2014-01-05 NOTE — Assessment & Plan Note (Signed)
MOST LIKELY DUE TO CHRONIC DISEASE, LESS LIKELY COLORECTAL POLYP.CANCER, OR H PYLORI GASTRITIS  TCS/EGD AFTER JUN 17 DUE TO PT REQUEST-MLX PREP. HOLD HCTZ ON DAY OF AND DAY BEFORE TCS/EGD. CHECK iFOBT REFER TO NEPHROLOGY FOLLOW UP IN 6 MOS.

## 2014-01-05 NOTE — Patient Instructions (Addendum)
RETURN YOUR STOOL TEST.  SEE THE KIDNEY DOCTOR.  ENDOSCOPY AFTER JUN 17. ON DAY BEFORE ENDOSCOPY, FOLLOW A FULL LIQUID DIET. & TAKE THE MIRALAX PREP: MIRALAX 17 GMS PO IN 8 OZ OF LIQUID-EVERY HOUR FROM 1P TO 7PM. DRINK 8 OZ OF LIQUID 30 MINUTES AFTER EACH DOSE: 130P TO 730PM.  HOLD WATER PILL ON THE DAY BEFORE AND DAY OF YOUR ENDOSCOPY.   FOLLOW UP IN 6 MOS.    Full Liquid Diet A high-calorie, high-protein supplement should be used to meet your nutritional requirements when the full liquid diet is continued for more than 2 or 3 days. If this diet is to be used for an extended period of time (more than 7 days), a multivitamin should be considered.  Breads and Starches  Allowed: None are allowed except crackers WHOLE OR pureed (made into a thick, smooth soup) in soup.    Avoid: Any others.   Vegetables  Allowed: Strained tomato or vegetable juice. Vegetables pureed in soup.   Avoid: Any others.    Fruit  Allowed: Any strained fruit juices and fruit drinks. Include 1 serving of citrus or vitamin C-enriched fruit juice daily.   Avoid: Any others.  Meat and Meat Substitutes  Allowed: Egg  Avoid: Any meat, fish, or fowl. All cheese.  Milk  Allowed: Milk beverages, including milk shakes and instant breakfast mixes. Smooth yogurt.   Avoid: Any others. Avoid dairy products if not tolerated.    Soups and Combination Foods  Allowed: Broth, strained cream soups. Strained, broth-based soups.   Avoid: Any others.    Desserts and Sweets  Allowed: flavored gelatin, plain ice cream, sherbet, smooth pudding, junket, fruit ices, frozen ice pops, pudding pops,, frozen fudge pops, chocolate syrup. Sugar, honey, jelly, syrup.   Avoid: Any others.  Fats and Oils  Allowed: Margarine, butter, cream, sour cream, oils.   Avoid: Any others.  Beverages  Allowed: All.   Avoid: None.  Condiments  Allowed: Iodized salt, pepper, spices, flavorings. Cocoa powder.   Avoid: Any  others.    SAMPLE MEAL PLAN Breakfast   cup orange juice.   1 cup  milk.   1 cup beverage (coffee or tea).   Cream or sugar, if desired.    Midmorning Snack  2 SCRAMBLED OR HARD BOILED EGG   Lunch  1 cup cream soup.    cup fruit juice.   1 cup milk.    cup custard.   1 cup beverage (coffee or tea).   Cream or sugar, if desired.    Midafternoon Snack  1 cup milk shake.  Dinner  1 cup cream soup.    cup fruit juice.   1 cup milk.    cup pudding.   1 cup beverage (coffee or tea).   Cream or sugar, if desired.  Evening Snack  1 cup supplement.  To increase calories, add sugar, cream, butter, or margarine if possible. Nutritional supplements will also increase the total calories.

## 2014-01-06 ENCOUNTER — Other Ambulatory Visit: Payer: Self-pay | Admitting: Gastroenterology

## 2014-01-06 DIAGNOSIS — N289 Disorder of kidney and ureter, unspecified: Secondary | ICD-10-CM

## 2014-01-09 NOTE — Progress Notes (Signed)
cc'd to pcp 

## 2014-01-10 ENCOUNTER — Ambulatory Visit (INDEPENDENT_AMBULATORY_CARE_PROVIDER_SITE_OTHER): Payer: Medicare HMO | Admitting: Gastroenterology

## 2014-01-10 DIAGNOSIS — D649 Anemia, unspecified: Secondary | ICD-10-CM

## 2014-01-10 LAB — IFOBT (OCCULT BLOOD): IFOBT: NEGATIVE

## 2014-01-10 NOTE — Progress Notes (Signed)
Pt return IFOBT test and it was NEGATIVE. 

## 2014-01-25 ENCOUNTER — Encounter (HOSPITAL_COMMUNITY): Payer: Self-pay

## 2014-01-26 ENCOUNTER — Telehealth: Payer: Self-pay | Admitting: *Deleted

## 2014-01-26 NOTE — Progress Notes (Signed)
LMOM to call.

## 2014-01-26 NOTE — Telephone Encounter (Signed)
Pt was returning Desiree [name]' call, Pt would like for [name] to please call her before her Drs appointment she will be leaving at 1:00. Please advise [phone]

## 2014-01-26 NOTE — Progress Notes (Addendum)
PLEASE CALL PT. HER TEST DID NOT DETECT BLOOD IN HER STOOL. TCS/EGD JUN 19.

## 2014-01-27 ENCOUNTER — Other Ambulatory Visit: Payer: Self-pay | Admitting: Cardiovascular Disease

## 2014-01-30 DIAGNOSIS — E8881 Metabolic syndrome: Secondary | ICD-10-CM | POA: Insufficient documentation

## 2014-01-30 DIAGNOSIS — N3000 Acute cystitis without hematuria: Secondary | ICD-10-CM | POA: Insufficient documentation

## 2014-01-30 NOTE — Telephone Encounter (Signed)
Rx was sent to pharmacy electronically. 

## 2014-01-30 NOTE — Telephone Encounter (Signed)
LMOM for a return call.  

## 2014-01-30 NOTE — Assessment & Plan Note (Signed)
Deteriorated.11 pound weight gain in 6 month Patient re-educated about  the importance of commitment to a  minimum of 150 minutes of exercise per week. The importance of healthy food choices with portion control discussed. Encouraged to start a food diary, count calories and to consider  joining a support group. Sample diet sheets offered. Goals set by the patient for the next several months.

## 2014-01-30 NOTE — Telephone Encounter (Signed)
Pt came by and was given her iFOBT results and is aware to do her procedure as scheduled.

## 2014-01-30 NOTE — Assessment & Plan Note (Addendum)
Symptomatic, check UA and treat  Presumptively if abnormal

## 2014-01-30 NOTE — Assessment & Plan Note (Signed)
Controlled, no change in medication  

## 2014-01-30 NOTE — Assessment & Plan Note (Signed)
Patient educated about the importance of limiting  Carbohydrate intake , the need to commit to daily physical activity for a minimum of 30 minutes , and to commit weight loss. The fact that changes in all these areas will reduce or eliminate all together the development of diabetes is stressed.    

## 2014-01-30 NOTE — Assessment & Plan Note (Signed)
The increased risk of cardiovascular disease associated with this diagnosis, and the need to consistently work on lifestyle to change this is discussed. Following  a  heart healthy diet ,commitment to 30 minutes of exercise at least 5 days per week, as well as control of blood sugar and cholesterol , and achieving a healthy weight are all the areas to be addressed .  

## 2014-01-30 NOTE — Assessment & Plan Note (Signed)
Anemia with normal iron, needs GI eval

## 2014-01-30 NOTE — Assessment & Plan Note (Signed)
Needs updated lab, past due  Hyperlipidemia:Low fat diet discussed and encouraged.  No change in med at this time

## 2014-02-02 NOTE — Progress Notes (Signed)
Pt is aware.  

## 2014-02-08 ENCOUNTER — Other Ambulatory Visit: Payer: Self-pay | Admitting: Cardiovascular Disease

## 2014-02-08 NOTE — Telephone Encounter (Signed)
Rx was sent to pharmacy electronically. 

## 2014-02-10 ENCOUNTER — Encounter (HOSPITAL_COMMUNITY): Payer: Self-pay | Admitting: *Deleted

## 2014-02-10 ENCOUNTER — Ambulatory Visit (HOSPITAL_COMMUNITY)
Admission: RE | Admit: 2014-02-10 | Discharge: 2014-02-10 | Disposition: A | Discharge status: Home / Self Care | Payer: Medicare HMO | Source: Ambulatory Visit | Attending: Gastroenterology | Admitting: Gastroenterology

## 2014-02-10 ENCOUNTER — Encounter (HOSPITAL_COMMUNITY)
Admission: RE | Disposition: A | Discharge status: Home / Self Care | Payer: Self-pay | Source: Ambulatory Visit | Attending: Gastroenterology

## 2014-02-10 DIAGNOSIS — K2940 Chronic atrophic gastritis without bleeding: Secondary | ICD-10-CM | POA: Insufficient documentation

## 2014-02-10 DIAGNOSIS — M129 Arthropathy, unspecified: Secondary | ICD-10-CM | POA: Insufficient documentation

## 2014-02-10 DIAGNOSIS — I712 Thoracic aortic aneurysm, without rupture, unspecified: Secondary | ICD-10-CM | POA: Insufficient documentation

## 2014-02-10 DIAGNOSIS — K2990 Gastroduodenitis, unspecified, without bleeding: Secondary | ICD-10-CM

## 2014-02-10 DIAGNOSIS — K2980 Duodenitis without bleeding: Secondary | ICD-10-CM | POA: Insufficient documentation

## 2014-02-10 DIAGNOSIS — Z9010 Acquired absence of unspecified breast and nipple: Secondary | ICD-10-CM | POA: Insufficient documentation

## 2014-02-10 DIAGNOSIS — K644 Residual hemorrhoidal skin tags: Secondary | ICD-10-CM | POA: Insufficient documentation

## 2014-02-10 DIAGNOSIS — Z7982 Long term (current) use of aspirin: Secondary | ICD-10-CM | POA: Insufficient documentation

## 2014-02-10 DIAGNOSIS — E785 Hyperlipidemia, unspecified: Secondary | ICD-10-CM | POA: Insufficient documentation

## 2014-02-10 DIAGNOSIS — Z8673 Personal history of transient ischemic attack (TIA), and cerebral infarction without residual deficits: Secondary | ICD-10-CM | POA: Insufficient documentation

## 2014-02-10 DIAGNOSIS — K5730 Diverticulosis of large intestine without perforation or abscess without bleeding: Secondary | ICD-10-CM | POA: Insufficient documentation

## 2014-02-10 DIAGNOSIS — I10 Essential (primary) hypertension: Secondary | ICD-10-CM | POA: Insufficient documentation

## 2014-02-10 DIAGNOSIS — Z853 Personal history of malignant neoplasm of breast: Secondary | ICD-10-CM | POA: Insufficient documentation

## 2014-02-10 DIAGNOSIS — Z923 Personal history of irradiation: Secondary | ICD-10-CM | POA: Insufficient documentation

## 2014-02-10 DIAGNOSIS — M5030 Other cervical disc degeneration, unspecified cervical region: Secondary | ICD-10-CM | POA: Insufficient documentation

## 2014-02-10 DIAGNOSIS — Z79899 Other long term (current) drug therapy: Secondary | ICD-10-CM | POA: Insufficient documentation

## 2014-02-10 DIAGNOSIS — K219 Gastro-esophageal reflux disease without esophagitis: Secondary | ICD-10-CM | POA: Insufficient documentation

## 2014-02-10 DIAGNOSIS — K2970 Gastritis, unspecified, without bleeding: Secondary | ICD-10-CM

## 2014-02-10 DIAGNOSIS — D649 Anemia, unspecified: Secondary | ICD-10-CM | POA: Insufficient documentation

## 2014-02-10 HISTORY — PX: COLONOSCOPY: SHX5424

## 2014-02-10 HISTORY — PX: ESOPHAGOGASTRODUODENOSCOPY: SHX5428

## 2014-02-10 SURGERY — COLONOSCOPY
Anesthesia: Moderate Sedation

## 2014-02-10 MED ORDER — MIDAZOLAM HCL 5 MG/5ML IJ SOLN
INTRAMUSCULAR | Status: AC
  Filled 2014-02-10: qty 10

## 2014-02-10 MED ORDER — MIDAZOLAM HCL 5 MG/5ML IJ SOLN
INTRAMUSCULAR | Status: DC | PRN
  Administered 2014-02-10 (×2): 1 mg via INTRAVENOUS
  Administered 2014-02-10 (×2): 2 mg via INTRAVENOUS

## 2014-02-10 MED ORDER — STERILE WATER FOR IRRIGATION IR SOLN
Status: DC | PRN
  Administered 2014-02-10: 09:00:00

## 2014-02-10 MED ORDER — SODIUM CHLORIDE 0.9 % IV SOLN
INTRAVENOUS | Status: DC
  Administered 2014-02-10: 1000 mL via INTRAVENOUS

## 2014-02-10 MED ORDER — LIDOCAINE VISCOUS 2 % MT SOLN
OROMUCOSAL | Status: AC
  Filled 2014-02-10: qty 15

## 2014-02-10 MED ORDER — MEPERIDINE HCL 100 MG/ML IJ SOLN
INTRAMUSCULAR | Status: DC | PRN
  Administered 2014-02-10 (×2): 25 mg via INTRAVENOUS

## 2014-02-10 MED ORDER — MEPERIDINE HCL 100 MG/ML IJ SOLN
INTRAMUSCULAR | Status: AC
  Filled 2014-02-10: qty 2

## 2014-02-10 NOTE — H&P (Signed)
Primary Care Physician:  Tula Nakayama, MD Primary Gastroenterologist:  Dr. Oneida Alar  Pre-Procedure History & Physical: HPI:  Desiree [name] is a 68 y.o. female here for  ANEMIA.  Past Medical History  Diagnosis Date  . Hyperlipidemia   . Hypertension   . History of CVA (cerebrovascular accident) PER SCAN IN 2011  . 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 Allergic rhinitis   . Vaginal wall prolapse   . Mixed urge and stress incontinence   . History of Bell's palsy 2011  RIGHT SIDE -- RESOLVED  . Thoracic ascending aortic aneurysm LAST CHEST CT 09-02-2010--  FOLLOWED BY  CARDIOLOGIST--  DR [id number]  . Nocturia   . Sinus drainage   . Heart murmur ASYMPTOMATIC  . Impaired fasting glucose PER PCP  DR SIMPSON    WATCH DIET  . GERD (gastroesophageal reflux disease) OCCASIONAL  . Arthritis HIPS/ WRISTS  . DDD (degenerative disc disease), cervical     Past Surgical History  Procedure Laterality Date  . Total abdominal hysterectomy w/ bilateral salpingoophorectomy  1990  . Neck surgery  2007  . Carpal tunnel release    . Cervical disc surgery  12-12-2005  DR STERN    LEFT  C6 - C7 HERINATED / DDD/ SPONDYLOSIS  . Right partial mastectomy  11-11-2004  DR PETER YOUNG    DUCTAL CARCINOMA IN SITU RIGHT BREAST  . Cardiac catheterization  02-01-2009  DR [facility]    NORMAL CORONARY ARTERIES/ NORMAL LV SIZE AND FUNCTION/ AORTIC ROOT SIZE AT THE UPPER LIMIT OF NORMAL   . Transthoracic echocardiogram  03-30-2008  DR MARGARET SIMPSON    LV SIZE AND FUNCTION NORMAL/ MODERATE AORTIC ARCH DILATATION/ MILD MR  . Bilateral carpal tunnel release  1980'S  . Cystocele repair  08/02/2012    Procedure: ANTERIOR REPAIR (CYSTOCELE);  Surgeon: Ailene Rud, MD;  Location: [facility];  Service: Urology;  Laterality: N/A;  [employer] Uphold Anterior Pelvic Floor Sacrospinus Repair. Anterior wall of the  vagina.    Prior to Admission medications   Medication Sig Start Date End Date Taking? Authorizing Brandolyn Shortridge  aspirin EC 325 MG tablet Take 1 tablet (325 mg total) by mouth daily. 02/21/13  Yes [name], MD  calcium-vitamin D (OSCAL 500/200 D-3) 500-200 MG-UNIT per tablet Take 1 tablet by mouth 2 (two) times daily.    Yes Historical Cyann Venti, MD  carvedilol (COREG) 3.125 MG tablet TAKE ONE TABLET BY MOUTH TWICE DAILY WITH A MEAL 02/08/14  Yes Mihai Croitoru, MD  esomeprazole (NEXIUM) 20 MG capsule Take 20 mg by mouth daily at 12 noon.   Yes Historical Chalon Zobrist, MD  gabapentin (NEURONTIN) 100 MG capsule Two capsules at bedtime , as needed, for nerve pain 07/13/13 07/13/14 Yes [name], MD  hydrochlorothiazide (HYDRODIURIL) 25 MG tablet TAKE  ONE TABLET BY MOUTH EVERY MORNING FOR FLUID RETENTION 08/01/13  Yes [name], MD  losartan (COZAAR) 50 MG tablet TAKE ONE TABLET BY MOUTH ONCE DAILY   Yes Mihai Croitoru, MD  lovastatin (MEVACOR) 40 MG tablet Take 1 tablet (40 mg total) by mouth at bedtime. TAKE 1 TABLET (40 MG TOTAL) BY MOUTH AT BEDTIME. 08/01/13  Yes [name], MD  Multiple Vitamins-Minerals (MULTIVITAMINS THER. W/MINERALS) TABS tablet Take 1 tablet by mouth daily.   Yes Historical Sharlee Rufino, MD  omeprazole (PRILOSEC) 20 MG capsule Take 20 mg by mouth daily.  Yes Historical Doron Shake, MD    Allergies as of 01/05/2014 - Review Complete 01/05/2014  Allergen Reaction Noted  . Codeine Other (See Comments)     Family History  Problem Relation Age of Onset  . Heart attack Mother   . Kidney failure Mother   . Hypertension Mother   . Throat cancer Father   . Prostate cancer Father   . Colon cancer Neg Hx   . Colon polyps Neg Hx     History   Social History  . Marital Status: Divorced    Spouse Name: N/A    Number of Children: 78  . Years of Education: N/A   Occupational History  . Not on file.   Social History Main Topics  . Smoking status:  Never Smoker   . Smokeless tobacco: Never Used  . Alcohol Use: No  . Drug Use: No  . Sexual Activity: Not on file   Other Topics Concern  . Not on file   Social History Narrative   RETIRED FROM [employer]. 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    Review of Systems: See HPI, otherwise negative ROS   Physical Exam: BP 131/88  Pulse 62  Temp(Src) 97.8 F (36.6 C) (Oral)  Resp 18  Ht 5\' 1"  (1.549 m)  Wt 191 lb (86.637 kg)  BMI 36.11 kg/m2  SpO2 97% General:   Alert,  pleasant and cooperative in NAD Head:  Normocephalic and atraumatic. Neck:  Supple; Lungs:  Clear throughout to auscultation.    Heart:  Regular rate and rhythm. Abdomen:  Soft, nontender and nondistended. Normal bowel sounds, without guarding, and without rebound.   Neurologic:  Alert and  oriented x4;  grossly normal neurologically.  Impression/Plan:    Anemia  PLAN:  1. TCS/?EGD TODAY

## 2014-02-10 NOTE — Discharge Instructions (Addendum)
You have internal hemorrhoids & diverticulosis IN YOUR LEFT COLON. You have gastritis, which MAY CONTRIBUTE TO A  LOW BLOOD COUNT IF YOUR KIDNEY FUNCTION IS NOT NORMAL.  I biopsied your stomach & duodenum.   CONTINUE OMEPRAZOLE EVERY MORNING.  AVOID ITEMS THAT TRIGGER GASTRITIS.  FOLLOW A HIGH FIBER/LOW FAT DIET. AVOID ITEMS THAT CAUSE BLOATING. SEE INFO BELOW.  YOUR BIOPSY RESULTS WILL BE AVAILABLE IN 7 DAYS.  Next colonoscopy in 10-15 YEARS IF THE BENEFITS OUTWEIGH THE RISKS.   ENDOSCOPY Care After Read the instructions outlined below and refer to this sheet in the next week. These discharge instructions provide you with general information on caring for yourself after you leave the hospital. While your treatment has been planned according to the most current medical practices available, unavoidable complications occasionally occur. If you have any problems or questions after discharge, call DR. Javion Holmer, [phone].  ACTIVITY  You may resume your regular activity, but move at a slower pace for the next 24 hours.   Take frequent rest periods for the next 24 hours.   Walking will help get rid of the air and reduce the bloated feeling in your belly (abdomen).   No driving for 24 hours (because of the medicine (anesthesia) used during the test).   You may shower.   Do not sign any important legal documents or operate any machinery for 24 hours (because of the anesthesia used during the test).    NUTRITION  Drink plenty of fluids.   You may resume your normal diet as instructed by your doctor.   Begin with a light meal and progress to your normal diet. Heavy or fried foods are harder to digest and may make you feel sick to your stomach (nauseated).   Avoid alcoholic beverages for 24 hours or as instructed.    MEDICATIONS  You may resume your normal medications.   WHAT YOU CAN EXPECT TODAY  Some feelings of bloating in the abdomen.   Passage of more gas than usual.     Spotting of blood in your stool or on the toilet paper  .  IF YOU HAD POLYPS REMOVED DURING THE ENDOSCOPY:  Eat a soft diet IF YOU HAVE NAUSEA, BLOATING, ABDOMINAL PAIN, OR VOMITING.    FINDING OUT THE RESULTS OF YOUR TEST Not all test results are available during your visit. DR. Oneida Alar WILL CALL YOU WITHIN 7 DAYS OF YOUR PROCEDUE WITH YOUR RESULTS. Do not assume everything is normal if you have not heard from DR. Aubrea Meixner IN ONE WEEK, CALL HER OFFICE AT [phone].  SEEK IMMEDIATE MEDICAL ATTENTION AND CALL THE OFFICE: [phone] IF:  You have more than a spotting of blood in your stool.   Your belly is swollen (abdominal distention).   You are nauseated or vomiting.   You have a temperature over 101F.   You have abdominal pain or discomfort that is severe or gets worse throughout the day.   Gastritis  Gastritis is an inflammation (the body's way of reacting to injury and/or infection) of the stomach. It is often caused by viral or bacterial (germ) infections. It can also be caused BY ASPIRIN, BC/GOODY POWDER'S, (IBUPROFEN) MOTRIN, OR ALEVE (NAPROXEN), chemicals (including alcohol), SPICY FOODS, and medications. This illness may be associated with generalized malaise (feeling tired, not well), UPPER ABDOMINAL STOMACH cramps, and fever. One common bacterial cause of gastritis is an organism known as H. Pylori. This can be treated with antibiotics.    High-Fiber Diet A high-fiber diet changes  your normal diet to include more whole grains, legumes, fruits, and vegetables. Changes in the diet involve replacing refined carbohydrates with unrefined foods. The calorie level of the diet is essentially unchanged. The Dietary Reference Intake (recommended amount) for adult males is 38 grams per day. For adult females, it is 25 grams per day. Pregnant and lactating women should consume 28 grams of fiber per day. Fiber is the intact part of a plant that is not broken down during digestion.  Functional fiber is fiber that has been isolated from the plant to provide a beneficial effect in the body. PURPOSE  Increase stool bulk.   Ease and regulate bowel movements.   Lower cholesterol.  INDICATIONS THAT YOU NEED MORE FIBER  Constipation and hemorrhoids.   Uncomplicated diverticulosis (intestine condition) and irritable bowel syndrome.   Weight management.   As a protective measure against hardening of the arteries (atherosclerosis), diabetes, and cancer.   GUIDELINES FOR INCREASING FIBER IN THE DIET  Start adding fiber to the diet slowly. A gradual increase of about 5 more grams (2 slices of whole-wheat bread, 2 servings of most fruits or vegetables, or 1 bowl of high-fiber cereal) per day is best. Too rapid an increase in fiber may result in constipation, flatulence, and bloating.   Drink enough water and fluids to keep your urine clear or pale yellow. Water, juice, or caffeine-free drinks are recommended. Not drinking enough fluid may cause constipation.   Eat a variety of high-fiber foods rather than one type of fiber.   Try to increase your intake of fiber through using high-fiber foods rather than fiber pills or supplements that contain small amounts of fiber.   The goal is to change the types of food eaten. Do not supplement your present diet with high-fiber foods, but replace foods in your present diet.  INCLUDE A VARIETY OF FIBER SOURCES  Replace refined and processed grains with whole grains, canned fruits with fresh fruits, and incorporate other fiber sources. White rice, white breads, and most bakery goods contain little or no fiber.   Brown whole-grain rice, buckwheat oats, and many fruits and vegetables are all good sources of fiber. These include: broccoli, Brussels sprouts, cabbage, cauliflower, beets, sweet potatoes, white potatoes (skin on), carrots, tomatoes, eggplant, squash, berries, fresh fruits, and dried fruits.   Cereals appear to be the richest  source of fiber. Cereal fiber is found in whole grains and bran. Bran is the fiber-rich outer coat of cereal grain, which is largely removed in refining. In whole-grain cereals, the bran remains. In breakfast cereals, the largest amount of fiber is found in those with "bran" in their names. The fiber content is sometimes indicated on the label.   You may need to include additional fruits and vegetables each day.   In baking, for 1 cup white flour, you may use the following substitutions:   1 cup whole-wheat flour minus 2 tablespoons.   1/2 cup white flour plus 1/2 cup whole-wheat flour.   Low-Fat Diet BREADS, CEREALS, PASTA, RICE, DRIED PEAS, AND BEANS These products are high in carbohydrates and most are low in fat. Therefore, they can be increased in the diet as substitutes for fatty foods. They too, however, contain calories and should not be eaten in excess. Cereals can be eaten for snacks as well as for breakfast.  Include foods that contain fiber (fruits, vegetables, whole grains, and legumes). Research shows that fiber may lower blood cholesterol levels, especially the water-soluble fiber found in fruits, vegetables,  oat products, and legumes. FRUITS AND VEGETABLES It is good to eat fruits and vegetables. Besides being sources of fiber, both are rich in vitamins and some minerals. They help you get the daily allowances of these nutrients. Fruits and vegetables can be used for snacks and desserts. MEATS Limit lean meat, chicken, [place], and fish to no more than 6 ounces per day. Beef, Pork, and Lamb Use lean cuts of beef, pork, and lamb. Lean cuts include:  Extra-lean ground beef.  Arm roast.  Sirloin tip.  Center-cut ham.  Round steak.  Loin chops.  Rump roast.  Tenderloin.  Trim all fat off the outside of meats before cooking. It is not necessary to severely decrease the intake of red meat, but lean choices should be made. Lean meat is rich in protein and contains a highly  absorbable form of iron. Premenopausal women, in particular, should avoid reducing lean red meat because this could increase the risk for low red blood cells (iron-deficiency anemia). The organ meats, such as liver, sweetbreads, kidneys, and brain are very rich in cholesterol. They should be limited. Chicken and [place] These are good sources of protein. The fat of poultry can be reduced by removing the skin and underlying fat layers before cooking. Chicken and [place] can be substituted for lean red meat in the diet. Poultry should not be fried or covered with high-fat sauces. Fish and Shellfish Fish is a good source of protein. Shellfish contain cholesterol, but they usually are low in saturated fatty acids. The preparation of fish is important. Like chicken and [place], they should not be fried or covered with high-fat sauces. EGGS Egg whites contain no fat or cholesterol. They can be eaten often. Try 1 to 2 egg whites instead of whole eggs in recipes or use egg substitutes that do not contain yolk. MILK AND DAIRY PRODUCTS Use skim or 1% milk instead of 2% or whole milk. Decrease whole milk, natural, and processed cheeses. Use nonfat or low-fat (2%) cottage cheese or low-fat cheeses made from vegetable oils. Choose nonfat or low-fat (1 to 2%) yogurt. Experiment with evaporated skim milk in recipes that call for heavy cream. Substitute low-fat yogurt or low-fat cottage cheese for sour cream in dips and salad dressings. Have at least 2 servings of low-fat dairy products, such as 2 glasses of skim (or 1%) milk each day to help get your daily calcium intake.  FATS AND OILS Reduce the total intake of fats, especially saturated fat. Butterfat, lard, and beef fats are high in saturated fat and cholesterol. These should be avoided as much as possible. Vegetable fats do not contain cholesterol, but certain vegetable fats, such as coconut oil, palm oil, and palm kernel oil are very high in saturated fats. These  should be limited. These fats are often used in bakery goods, processed foods, popcorn, oils, and nondairy creamers. Vegetable shortenings and some peanut butters contain hydrogenated oils, which are also saturated fats. Read the labels on these foods and check for saturated vegetable oils. Unsaturated vegetable oils and fats do not raise blood cholesterol. However, they should be limited because they are fats and are high in calories. Total fat should still be limited to 30% of your daily caloric intake. Desirable liquid vegetable oils are corn oil, cottonseed oil, olive oil, canola oil, safflower oil, soybean oil, and sunflower oil. Peanut oil is not as good, but small amounts are acceptable. Buy a heart-healthy tub margarine that has no partially hydrogenated oils in the ingredients. Mayonnaise  and salad dressings often are made from unsaturated fats, but they should also be limited because of their high calorie and fat content. Seeds, nuts, peanut butter, olives, and avocados are high in fat, but the fat is mainly the unsaturated type. These foods should be limited mainly to avoid excess calories and fat. OTHER EATING TIPS Snacks  Most sweets should be limited as snacks. They tend to be rich in calories and fats, and their caloric content outweighs their nutritional value. Some good choices in snacks are graham crackers, melba toast, soda crackers, bagels (no egg), English muffins, fruits, and vegetables. These snacks are preferable to snack crackers, [place] fries, and chips. Popcorn should be air-popped or cooked in small amounts of liquid vegetable oil. Desserts Eat fruit, low-fat yogurt, and fruit ices. AVOID pastries, cake, and cookies. Sherbet, angel food cake, gelatin dessert, frozen low-fat yogurt, or other frozen products that do not contain saturated fat (pure fruit juice bars, frozen ice pops) are also acceptable.  COOKING METHODS Choose those methods that use little or no fat. They  include: Poaching.  Braising.  Steaming.  Grilling.  Baking.  Stir-frying.  Broiling.  Microwaving.  Foods can be cooked in a nonstick pan without added fat, or use a nonfat cooking spray in regular cookware. Limit fried foods and avoid frying in saturated fat. Add moisture to lean meats by using water, broth, cooking wines, and other nonfat or low-fat sauces along with the cooking methods mentioned above. Soups and stews should be chilled after cooking. The fat that forms on top after a few hours in the refrigerator should be skimmed off. When preparing meals, avoid using excess salt. Salt can contribute to raising blood pressure in some people. EATING AWAY FROM HOME Order entres, potatoes, and vegetables without sauces or butter. When meat exceeds the size of a deck of cards (3 to 4 ounces), the rest can be taken home for another meal. Choose vegetable or fruit salads and ask for low-calorie salad dressings to be served on the side. Use dressings sparingly. Limit high-fat toppings, such as bacon, crumbled eggs, cheese, sunflower seeds, and olives. Ask for heart-healthy tub margarine instead of butter.   Diverticulosis Diverticulosis is a common condition that develops when small pouches (diverticula) form in the wall of the colon. The risk of diverticulosis increases with age. It happens more often in people who eat a low-fiber diet. Most individuals with diverticulosis have no symptoms. Those individuals with symptoms usually experience belly (abdominal) pain, constipation, or loose stools (diarrhea).  HOME CARE INSTRUCTIONS  Increase the amount of fiber in your diet as directed by your caregiver or dietician. This may reduce symptoms of diverticulosis.   Drink at least 6 to 8 glasses of water each day to prevent constipation.   Try not to strain when you have a bowel movement.   Avoiding nuts and seeds to prevent complications is still an uncertain benefit.       FOODS HAVING  HIGH FIBER CONTENT INCLUDE:  Fruits. Apple, peach, pear, tangerine, raisins, prunes.   Vegetables. Brussels sprouts, asparagus, broccoli, cabbage, carrot, cauliflower, romaine lettuce, spinach, summer squash, tomato, winter squash, zucchini.   Starchy Vegetables. Baked beans, kidney beans, lima beans, split peas, lentils, potatoes (with skin).   Grains. Whole wheat bread, brown rice, bran flake cereal, plain oatmeal, white rice, shredded wheat, bran muffins.    SEEK IMMEDIATE MEDICAL CARE IF:  You develop increasing pain or severe bloating.   You have an oral temperature above 101F.  You develop vomiting or bowel movements that are bloody or black.    Hemorrhoids Hemorrhoids are dilated (enlarged) veins around the rectum. Sometimes clots will form in the veins. This makes them swollen and painful. These are called thrombosed hemorrhoids. Causes of hemorrhoids include:  Constipation.   Straining to have a bowel movement.   HEAVY LIFTING HOME CARE INSTRUCTIONS  Eat a well balanced diet and drink 6 to 8 glasses of water every day to avoid constipation. You may also use a bulk laxative.   Avoid straining to have bowel movements.   Keep anal area dry and clean.   Do not use a donut shaped pillow or sit on the toilet for long periods. This increases blood pooling and pain.   Move your bowels when your body has the urge; this will require less straining and will decrease pain and pressure.  Colonoscopy, Care After Refer to this sheet in the next few weeks. These instructions provide you with information on caring for yourself after your procedure. Your health care provider may also give you more specific instructions. Your treatment has been planned according to current medical practices, but problems sometimes occur. Call your health care provider if you have any problems or questions after your procedure. WHAT TO EXPECT AFTER THE PROCEDURE  After your procedure, it is typical  to have the following: A small amount of blood in your stool. Moderate amounts of gas and mild abdominal cramping or bloating. HOME CARE INSTRUCTIONS Do not drive, operate machinery, or sign important documents for 24 hours. You may shower and resume your regular physical activities, but move at a slower pace for the first 24 hours. Take frequent rest periods for the first 24 hours. Walk around or put a warm pack on your abdomen to help reduce abdominal cramping and bloating. Drink enough fluids to keep your urine clear or pale yellow. You may resume your normal diet as instructed by your health care provider. Avoid heavy or fried foods that are hard to digest. Avoid drinking alcohol for 24 hours or as instructed by your health care provider. Only take over-the-counter or prescription medicines as directed by your health care provider. If a tissue sample (biopsy) was taken during your procedure: Do not take aspirin or blood thinners for 7 days, or as instructed by your health care provider. Do not drink alcohol for 7 days, or as instructed by your health care provider. Eat soft foods for the first 24 hours. SEEK MEDICAL CARE IF: You have persistent spotting of blood in your stool 2-3 days after the procedure. SEEK IMMEDIATE MEDICAL CARE IF: You have more than a small spotting of blood in your stool. You pass large blood clots in your stool. Your abdomen is swollen (distended). You have nausea or vomiting. You have a fever. You have increasing abdominal pain that is not relieved with medicine. Document Released: 03/25/2004 Document Revised: 06/01/2013 Document Reviewed: 04/18/2013 [facility] Patient Information 2015 [place], [place]. This information is not intended to replace advice given to you by your health care provider. Make sure you discuss any questions you have with your health care provider.

## 2014-02-12 NOTE — Op Note (Signed)
[place], [address]   ENDOSCOPY PROCEDURE REPORT  PATIENT: Desiree [name], Prim  MR#: [id number] BIRTHDATE: [birth year] , 60  yrs. old GENDER: Female  ENDOSCOPIST: Barney Drain, MD REFERRED [id number] Moshe Cipro, M.D.  PROCEDURE DATE: 02/10/2014 PROCEDURE:   EGD w/ biopsy  INDICATIONS:Anemia. APR 2015 Hb 11 MCV 84.1  FERRITIN 166, GFR 56, JUN 2015 iFOBT NEG MEDICATIONS: TCS+ Versed 1mg  IV TOPICAL ANESTHETIC:   Viscous Xylocaine  DESCRIPTION OF PROCEDURE:     Physical exam was performed.  Informed consent was obtained from the patient after explaining the benefits, risks, and alternatives to the procedure.  The patient was connected to the monitor and placed in the left lateral position.  Continuous oxygen was provided by nasal cannula and IV medicine administered through an indwelling cannula.  After administration of sedation, the patients esophagus was intubated and the EG-[id number] ([id number])  endoscope was advanced under direct visualization to the second portion of the duodenum.  The scope was removed slowly by carefully examining the color, texture, anatomy, and integrity of the mucosa on the way out.  The patient was recovered in endoscopy and discharged home in satisfactory condition.   ESOPHAGUS: The mucosa of the esophagus appeared normal.   STOMACH: Mild erosive gastritis (inflammation) was found in the gastric body and gastric antrum.  Multiple biopsies were performed using cold forceps.   DUODENUM: Mild duodenal inflammation was found in the duodenal bulb.   The duodenal mucosa showed no abnormalities in the 2nd part of the duodenum.  Cold forceps biopsies were taken in the bulb and second portion. COMPLICATIONS:   None  ENDOSCOPIC IMPRESSION: MILD GASTRITIS/DUODENITIS NO OBVIOUS SOURCE FOR ANEMIA IDNETIFIED  RECOMMENDATIONS: CONTINUE OMEPRAZOLE EVERY MORNING. AVOID ITEMS THAT TRIGGER GASTRITIS. FOLLOW A HIGH FIBER/LOW FAT  DIET.  AVOID ITEMS THAT CAUSE BLOATING.  BIOPSY RESULTS WILL BE AVAILABLE IN 7 DAYS.  Next colonoscopy in 10-15 YEARS IF THE BENEFITS OUTWEIGH THE RISKS.   REPEAT EXAM:   _______________________________ Lorrin MaisBarney Drain, MD 02/12/2014 9:32 PM

## 2014-02-12 NOTE — Op Note (Signed)
[facility] [address] [place], [address]   COLONOSCOPY PROCEDURE REPORT  PATIENT: Desiree [name], Desiree [name]  MR#: [id number] BIRTHDATE: Aug 10, 1946 , 72  yrs. old GENDER: Female ENDOSCOPIST: Barney Drain, MD REFERRED [id number] Moshe Cipro, M.D. PROCEDURE DATE:  02/10/2014 PROCEDURE:   Colonoscopy, diagnostic INDICATIONS:Anemia, non-specific. MEDICATIONS: Demerol 50 mg IV and Versed 5 mg IV  DESCRIPTION OF PROCEDURE:    Physical exam was performed.  Informed consent was obtained from the patient after explaining the benefits, risks, and alternatives to procedure.  The patient was connected to monitor and placed in left lateral position. Continuous oxygen was provided by nasal cannula and IV medicine administered through an indwelling cannula.  After administration of sedation and rectal exam, the patients rectum was intubated and the EC-[id number] ([id number]) and EG-[id number] ([id number])  colonoscope was advanced under direct visualization to the ileum.  The scope was removed slowly by carefully examining the color, texture, anatomy, and integrity mucosa on the way out.  The patient was recovered in endoscopy and discharged home in satisfactory condition.    COLON FINDINGS: The mucosa appeared normal in the terminal ileum.  , Mild diverticulosis was noted in the transverse colon and sigmoid colon.  , and Small external hemorrhoids were found.  PREP QUALITY: good. CECAL W/D TIME: 11 minutes     COMPLICATIONS: None  ENDOSCOPIC IMPRESSION: 1.   Normal mucosa in the terminal ileum 2.   Mild diverticulosis in the transverse colon and sigmoid colon 3.   Small external hemorrhoids 4. NO OBVIOUS SOURCE FOR ANEMIA IDENTIFIED   RECOMMENDATIONS: CONTINUE OMEPRAZOLE EVERY MORNING. AVOID ITEMS THAT TRIGGER GASTRITIS. FOLLOW A HIGH FIBER/LOW FAT DIET.  AVOID ITEMS THAT CAUSE BLOATING.  BIOPSY RESULTS WILL BE AVAILABLE IN 7 DAYS.  Next colonoscopy in 10-15 YEARS IF THE BENEFITS  OUTWEIGH THE RISKS.       _______________________________ Lorrin MaisBarney Drain, MD 02/12/2014 9:38 PM

## 2014-02-15 ENCOUNTER — Telehealth: Payer: Self-pay | Admitting: Family Medicine

## 2014-02-15 NOTE — Telephone Encounter (Signed)
Patient is aware 

## 2014-02-17 ENCOUNTER — Encounter (HOSPITAL_COMMUNITY): Payer: Self-pay | Admitting: Gastroenterology

## 2014-03-01 ENCOUNTER — Other Ambulatory Visit (HOSPITAL_COMMUNITY): Payer: Self-pay | Admitting: Nephrology

## 2014-03-01 DIAGNOSIS — N289 Disorder of kidney and ureter, unspecified: Secondary | ICD-10-CM

## 2014-03-02 ENCOUNTER — Telehealth: Payer: Self-pay | Admitting: Gastroenterology

## 2014-03-02 NOTE — Telephone Encounter (Signed)
Pt aware of results and ok to schedule Givens.

## 2014-03-02 NOTE — Telephone Encounter (Signed)
Please call pt. HER stomach Bx shows gastritis.  HER SMALL BOWEL BIOPSIES ARE NORMAL. SHE NEEDS A GIVENS CAPSULE TO COMPLETE HER WORKUP FOR A LOW BLOOD COUNT, DX: normocytic anemia/OBSCURE GI BLEED. OPV NOV 2015 E30 ANEMIA.

## 2014-03-03 ENCOUNTER — Other Ambulatory Visit: Payer: Self-pay | Admitting: Gastroenterology

## 2014-03-03 NOTE — Telephone Encounter (Signed)
GIVENS Is scheduled for Monday July 20th I have mailed Mrs. Graciano the instructions

## 2014-03-03 NOTE — Telephone Encounter (Signed)
Reminder in epic °

## 2014-03-07 ENCOUNTER — Telehealth: Payer: Self-pay | Admitting: Gastroenterology

## 2014-03-07 NOTE — Telephone Encounter (Signed)
[employer] PAC # [id number] FOR GIVENS CAPSULE STUDY

## 2014-03-08 ENCOUNTER — Encounter (HOSPITAL_COMMUNITY): Payer: Self-pay | Admitting: Pharmacy Technician

## 2014-03-13 ENCOUNTER — Ambulatory Visit (HOSPITAL_COMMUNITY)
Admission: RE | Admit: 2014-03-13 | Discharge: 2014-03-13 | Disposition: A | Discharge status: Home / Self Care | Payer: Medicare HMO | Source: Ambulatory Visit | Attending: Gastroenterology | Admitting: Gastroenterology

## 2014-03-13 ENCOUNTER — Encounter (HOSPITAL_COMMUNITY)
Admission: RE | Disposition: A | Discharge status: Home / Self Care | Payer: Self-pay | Source: Ambulatory Visit | Attending: Gastroenterology

## 2014-03-13 ENCOUNTER — Ambulatory Visit (HOSPITAL_COMMUNITY): Payer: Medicare HMO

## 2014-03-13 DIAGNOSIS — D649 Anemia, unspecified: Secondary | ICD-10-CM

## 2014-03-13 HISTORY — PX: GIVENS CAPSULE STUDY: SHX5432

## 2014-03-13 SURGERY — IMAGING PROCEDURE, GI TRACT, INTRALUMINAL, VIA CAPSULE

## 2014-03-13 MED ORDER — SIMETHICONE 40 MG/0.6ML PO SUSP
ORAL | Status: AC
  Filled 2014-03-13: qty 0.6

## 2014-03-14 ENCOUNTER — Ambulatory Visit (HOSPITAL_COMMUNITY)
Admission: RE | Admit: 2014-03-14 | Discharge: 2014-03-14 | Disposition: A | Discharge status: Home / Self Care | Payer: Medicare HMO | Source: Ambulatory Visit | Attending: Nephrology | Admitting: Nephrology

## 2014-03-14 DIAGNOSIS — N289 Disorder of kidney and ureter, unspecified: Secondary | ICD-10-CM | POA: Insufficient documentation

## 2014-03-15 NOTE — Procedures (Addendum)
PT BEING EVALUATED FOR OBSCURE GI BLEED/NORMOCYTIC ANEMIA-APR 2015-Hb 11, MCV 84.3, Cr 1.16, GFR 56, FERRITIN 166, MAY 2015: iFOBT NEG, JUN 2015: EGD/TCS-GASTRITIS, NL DUO Bx, HYPERPLASTIC POLYP  PATIENT DATA: WEIGHT: 191 LBS     WAIST:42 IN      HEIGHT:  62 IN   GASTRIC PASSAGE TIME: 76m, SB PASSAGE TIME: 4H 52m  RESULTS: LIMITED views of gastric mucosa due to retained contents.  No ULCERS, masses or AVMs seen.  LIMITED VIEWS OF THE COLON DUE TO RETAINED CONTENTS. No old blood or fresh blood in the stomach, small bowel, or colon.  DIAGNOSIS: NORMAL GIVENS STUDY-NORMOCYTIC ANEMIA MOST LIKELY DUE TO CHRONIC DISEASE(GFR 56)  Plan: 1. NEPHROLOGY  REFERRAL FOR ANEMIA/PROCRIT 2. OPV IN 6 MOS

## 2014-03-16 ENCOUNTER — Encounter (HOSPITAL_COMMUNITY): Payer: Self-pay | Admitting: Gastroenterology

## 2014-03-30 ENCOUNTER — Telehealth: Payer: Self-pay

## 2014-03-30 NOTE — Telephone Encounter (Signed)
Pt is calling to get the results from her Givens. Please advise

## 2014-04-04 ENCOUNTER — Telehealth: Payer: Self-pay | Admitting: Gastroenterology

## 2014-04-04 NOTE — Telephone Encounter (Signed)
Pt had a pill study done last month and was checking to see what her results were. I told her that [facility] nurse was at lunch and should be back in about 15 minutes or so. She said that she would wait 15 minutes and then she had somewhere to go. Please call patient with results if available. [phone]

## 2014-04-04 NOTE — Telephone Encounter (Signed)
I called pt and told her I do not have the report on the Givens. I will let Dr. Oneida Alar know tomorrow that she has called again.  The Givens was done on 03/03/2014.

## 2014-04-05 NOTE — Telephone Encounter (Signed)
PLEASE CALL PT. HER GIVENS CAPSULE STUDY IS NORMAL. NO SOURCE FOR HER LOW BLOOD COUNT CAN BE IDENTIFIED EXCEPT REDUCED KIDNEY FUNCTION. SHE SHOULD SEE A KIDNEY DOCTOR-CHRONIC RENAL INSUFFICIENCY, GFR 56/ANEMIA AND TO HAVE PROCRIT SHOTS TO RAISE HER BLOOD COUNT AND AVOID BLOOD TRANSFUSIONS. OPV NOV 2015 E30 ANEMIA

## 2014-04-05 NOTE — Telephone Encounter (Signed)
REVIEWED.  

## 2014-04-06 NOTE — Telephone Encounter (Signed)
LMOM to call back

## 2014-04-06 NOTE — Telephone Encounter (Signed)
Pt is aware of results. She is asking what is the Procrit shot. Her kidney doctor told her to take iron pills. Please advise

## 2014-04-06 NOTE — Telephone Encounter (Signed)
Reminder in epic °

## 2014-04-11 NOTE — Telephone Encounter (Signed)
PLEASE CALL PT. PROCRIT IS A SHOT TO RAISE HER BLOOD COUNT.

## 2014-04-12 NOTE — Telephone Encounter (Signed)
Pt is aware.  

## 2014-04-12 NOTE — Telephone Encounter (Signed)
LMOM to call back

## 2014-05-01 ENCOUNTER — Other Ambulatory Visit: Payer: Self-pay | Admitting: Family Medicine

## 2014-05-05 ENCOUNTER — Telehealth: Payer: Self-pay | Admitting: Family Medicine

## 2014-05-07 NOTE — Telephone Encounter (Signed)
pls refill x 1 with 1 refill triamcinolone ordered in 2013 and let her know

## 2014-05-08 ENCOUNTER — Other Ambulatory Visit: Payer: Self-pay

## 2014-05-08 MED ORDER — TRIAMCINOLONE ACETONIDE 0.05 % EX OINT: TOPICAL_OINTMENT | CUTANEOUS | Status: DC

## 2014-05-08 NOTE — Telephone Encounter (Signed)
Med refilled. And message left for patient on home number

## 2014-05-09 ENCOUNTER — Other Ambulatory Visit: Payer: Self-pay | Admitting: *Deleted

## 2014-05-09 DIAGNOSIS — I7121 Aneurysm of the ascending aorta, without rupture: Secondary | ICD-10-CM

## 2014-05-09 DIAGNOSIS — I712 Thoracic aortic aneurysm, without rupture: Secondary | ICD-10-CM

## 2014-05-15 ENCOUNTER — Other Ambulatory Visit: Payer: Self-pay | Admitting: *Deleted

## 2014-05-15 DIAGNOSIS — I712 Thoracic aortic aneurysm, without rupture, unspecified: Secondary | ICD-10-CM

## 2014-05-22 ENCOUNTER — Encounter: Payer: Self-pay | Admitting: Family Medicine

## 2014-05-22 ENCOUNTER — Ambulatory Visit (INDEPENDENT_AMBULATORY_CARE_PROVIDER_SITE_OTHER): Payer: Commercial Managed Care - HMO | Admitting: Family Medicine

## 2014-05-22 ENCOUNTER — Encounter (INDEPENDENT_AMBULATORY_CARE_PROVIDER_SITE_OTHER): Payer: Self-pay

## 2014-05-22 VITALS — BP 120/70 | HR 72 | Resp 14 | Ht 61.5 in | Wt 188.0 lb

## 2014-05-22 DIAGNOSIS — I10 Essential (primary) hypertension: Secondary | ICD-10-CM

## 2014-05-22 DIAGNOSIS — Z0000 Encounter for general adult medical examination without abnormal findings: Secondary | ICD-10-CM | POA: Insufficient documentation

## 2014-05-22 DIAGNOSIS — Z23 Encounter for immunization: Secondary | ICD-10-CM

## 2014-05-22 DIAGNOSIS — R32 Unspecified urinary incontinence: Secondary | ICD-10-CM | POA: Insufficient documentation

## 2014-05-22 DIAGNOSIS — R7301 Impaired fasting glucose: Secondary | ICD-10-CM

## 2014-05-22 DIAGNOSIS — E785 Hyperlipidemia, unspecified: Secondary | ICD-10-CM

## 2014-05-22 MED ORDER — PROMETHAZINE-DM 6.25-15 MG/5ML PO SYRP: 5.0000 mL | ORAL_SOLUTION | Freq: Every evening | ORAL | Status: DC | PRN

## 2014-05-22 MED ORDER — HYDROCHLOROTHIAZIDE 25 MG PO TABS
ORAL_TABLET | ORAL | Status: DC
Start: 2014-05-22 — End: 2014-08-30

## 2014-05-22 NOTE — Patient Instructions (Addendum)
F/u in 4 month, call if you need me before  Fasting lipid, cmp and eGFr, hBa1C and CBc this week please flu vaccine today  Increase activity and continue to work on weight loss tio improve health.  Please discuss end of life wishes and document with children involved as we discussed

## 2014-05-22 NOTE — Assessment & Plan Note (Signed)
Annual exam as documented. Counseling done  re healthy lifestyle involving commitment to 150 minutes exercise per week, heart healthy diet, and attaining healthy weight.The importance of adequate sleep also discussed. Regular seat belt use and safe storage  of firearms if patient has them, is also discussed. Changes in health habits are decided on by the patient with goals and time frames  set for achieving them. Immunization and cancer screening needs are specifically addressed at this visit.  

## 2014-05-22 NOTE — Progress Notes (Signed)
   Subjective:    Patient ID: Desiree [name], female    DOB: Dec 31, 1945, 68 y.o.   MRN: [id number]  HPI Preventive Screening-Counseling & Management   Patient present here today for a Medicare annual wellness visit.   Current Problems (verified)   Medications Prior to Visit Allergies (verified)   PAST HISTORY  Family History (verified)   Social History Divorced for over 30 years, 4 children, works part time    Risk Factors  Current exercise habits: Walks daily (weather permitting) or goes to the [employer] at least 3 times a week   Dietary issues discussed: Heart healthy diet, limits red meats, eats mainly fruits and vegetables, fish and chicken   Cardiac risk factors: Mother- massive MI age 34, metabolic syndrome  Depression Screen  (Note: if answer to either of the following is "Yes", a more complete depression screening is indicated)   Over the past two weeks, have you felt down, depressed or hopeless? No  Over the past two weeks, have you felt little interest or pleasure in doing things? No  Have you lost interest or pleasure in daily life? No  Do you often feel hopeless? No  Do you cry easily over simple problems? No   Activities of Daily Living  In your present state of health, do you have any difficulty performing the following activities?  Driving?: No Managing money?: No Feeding yourself?:No Getting from bed to chair?:No Climbing a flight of stairs?:No Preparing food and eating?:No Bathing or showering?:No Getting dressed?:No Getting to the toilet?:No Using the toilet?:No Moving around from place to place?: No  Fall Risk Assessment In the past year have you fallen or had a near fall?:No Are you currently taking any medications that make you dizzy?: some of them but they are taken at bedtime    Hearing Difficulties: No Do you often ask people to speak up or repeat themselves?:No Do you experience ringing or noises in your ears?:No Do you have difficulty  understanding soft or whispered voices?:No  Cognitive Testing  Alert? Yes Normal Appearance?Yes  Oriented to person? Yes Place? Yes  Time? Yes  Displays appropriate judgment?Yes  Can read the correct time from a watch face? yes Are you having problems remembering things? Sometimes, but it comes back to her if she thinks about it   Advanced Directives have been discussed with the patient?Yes, brochure given , full code.    List the Names of Other Physician/Practitioners you currently use:    Indicate any recent Medical Services you may have received from other than [facility] providers in the past year (date may be approximate).   Assessment:    Annual Wellness Exam   Plan:    .  Medicare Attestation  I have personally reviewed:  The patient's medical and social history  Their use of alcohol, tobacco or illicit drugs  Their current medications and supplements  The patient's functional ability including ADLs,fall risks, home safety risks, cognitive, and hearing and visual impairment  Diet and physical activities  Evidence for depression or mood disorders  The patient's weight, height, BMI, and visual acuity have been recorded in the chart. I have made referrals, counseling, and provided education to the patient based on review of the above and I have provided the patient with a written personalized care plan for preventive services.      Review of Systems     Objective:   Physical Exam        Assessment & Plan:

## 2014-05-23 ENCOUNTER — Encounter: Payer: Self-pay | Admitting: Family Medicine

## 2014-05-23 DIAGNOSIS — Z23 Encounter for immunization: Secondary | ICD-10-CM | POA: Insufficient documentation

## 2014-05-23 NOTE — Assessment & Plan Note (Signed)
Vaccine administered at visit.  

## 2014-05-24 ENCOUNTER — Telehealth: Payer: Self-pay | Admitting: Cardiovascular Disease

## 2014-05-24 NOTE — Telephone Encounter (Signed)
Informed patient that labs ordered by PCP will be able to be seen in [facility] by Dr. [name], so no other labs will need to ordered at this time. Patient voiced understanding.

## 2014-05-24 NOTE — Telephone Encounter (Signed)
Pt would like to know if she needs labs done prior to coming in to see Dr.C on 12/3. Please call  Thanks

## 2014-05-25 ENCOUNTER — Other Ambulatory Visit: Payer: Self-pay | Admitting: Family Medicine

## 2014-05-27 LAB — CBC
HCT: 33.9 % — ABNORMAL LOW (ref 36.0–46.0)
Hemoglobin: 11.5 g/dL — ABNORMAL LOW (ref 12.0–15.0)
MCH: 28.3 pg (ref 26.0–34.0)
MCHC: 33.9 g/dL (ref 30.0–36.0)
MCV: 83.3 fL (ref 78.0–100.0)
Platelets: 316 10*3/uL (ref 150–400)
RBC: 4.07 MIL/uL (ref 3.87–5.11)
RDW: 14.4 % (ref 11.5–15.5)
WBC: 3.4 10*3/uL — AB (ref 4.0–10.5)

## 2014-05-27 LAB — COMPLETE METABOLIC PANEL WITH GFR
ALT: 15 U/L (ref 0–35)
AST: 27 U/L (ref 0–37)
Albumin: 3.9 g/dL (ref 3.5–5.2)
Alkaline Phosphatase: 70 U/L (ref 39–117)
BUN: 17 mg/dL (ref 6–23)
CO2: 30 mEq/L (ref 19–32)
Calcium: 9.6 mg/dL (ref 8.4–10.5)
Chloride: 103 mEq/L (ref 96–112)
Creat: 1.04 mg/dL (ref 0.50–1.10)
GFR, Est African American: 64 mL/min
GFR, Est Non African American: 56 mL/min — ABNORMAL LOW
Glucose, Bld: 93 mg/dL (ref 70–99)
Potassium: 4.1 mEq/L (ref 3.5–5.3)
Sodium: 142 mEq/L (ref 135–145)
Total Bilirubin: 0.7 mg/dL (ref 0.2–1.2)
Total Protein: 6.8 g/dL (ref 6.0–8.3)

## 2014-05-27 LAB — LIPID PANEL
Cholesterol: 155 mg/dL (ref 0–200)
HDL: 76 mg/dL (ref >39)
LDL Cholesterol: 71 mg/dL (ref 0–99)
Total CHOL/HDL Ratio: 2 Ratio
Triglycerides: 41 mg/dL (ref <150)
VLDL: 8 mg/dL (ref 0–40)

## 2014-05-27 LAB — HEMOGLOBIN A1C
Hgb A1c MFr Bld: 5.6 % (ref <5.7)
Mean Plasma Glucose: 114 mg/dL (ref <117)

## 2014-06-02 ENCOUNTER — Other Ambulatory Visit: Payer: Self-pay | Admitting: *Deleted

## 2014-06-02 DIAGNOSIS — I712 Thoracic aortic aneurysm, without rupture, unspecified: Secondary | ICD-10-CM

## 2014-06-06 ENCOUNTER — Encounter: Payer: Self-pay | Admitting: Gastroenterology

## 2014-06-15 ENCOUNTER — Ambulatory Visit: Payer: Self-pay | Admitting: Cardiothoracic Surgery

## 2014-06-15 ENCOUNTER — Other Ambulatory Visit: Payer: Self-pay

## 2014-06-29 ENCOUNTER — Ambulatory Visit
Admission: RE | Admit: 2014-06-29 | Discharge: 2014-06-29 | Disposition: A | Discharge status: Home / Self Care | Payer: Commercial Managed Care - HMO | Source: Ambulatory Visit | Attending: Cardiothoracic Surgery | Admitting: Cardiothoracic Surgery

## 2014-06-29 ENCOUNTER — Ambulatory Visit: Payer: Self-pay | Admitting: Cardiothoracic Surgery

## 2014-06-29 ENCOUNTER — Telehealth: Payer: Self-pay | Admitting: *Deleted

## 2014-06-29 ENCOUNTER — Ambulatory Visit (INDEPENDENT_AMBULATORY_CARE_PROVIDER_SITE_OTHER): Payer: Commercial Managed Care - HMO | Admitting: Cardiothoracic Surgery

## 2014-06-29 ENCOUNTER — Encounter: Payer: Self-pay | Admitting: Cardiothoracic Surgery

## 2014-06-29 VITALS — BP 110/69 | HR 92 | Ht 61.0 in | Wt 192.0 lb

## 2014-06-29 DIAGNOSIS — I712 Thoracic aortic aneurysm, without rupture, unspecified: Secondary | ICD-10-CM

## 2014-06-29 DIAGNOSIS — I7121 Aneurysm of the ascending aorta, without rupture: Secondary | ICD-10-CM

## 2014-06-29 MED ORDER — IOHEXOL 350 MG/ML SOLN
75.0000 mL | Freq: Once | INTRAVENOUS | Status: AC | PRN
Start: 2014-06-29 — End: 2014-06-29
  Administered 2014-06-29: 75 mL via INTRAVENOUS

## 2014-06-29 NOTE — Telephone Encounter (Signed)
Patient called stating she has 2 appointments today that she needs referrals for 1. Desiree [name] imaging 2. Dr. Rica [name]. Please advise

## 2014-06-29 NOTE — Telephone Encounter (Signed)
Left message to tell patient that both referrals are finished for her

## 2014-06-29 NOTE — Progress Notes (Signed)
[address]       [place] [address]             [phone]                    Gracious W Canizares [place] Medical Record #[id number] Date of Birth: Nov 17, 1945  Referring: [name], MD Primary Care: Tula Nakayama, MD Cardiology: Dr  Bertrum Sol  Chief Complaint:    Chief Complaint  Patient presents with  . F/U THORACIC    1 YR F/U    History of Present Illness:    Patient is a 68 year old female who has had a known dilated descending aorta since at least 2009 with serial CT scans of the chest having been performed. She is now referred to cardiac surgery after a recent CT scan done this month. The patient was seen with right neck and shoulder pain since completely resolved. In the evaluation of this a repeat CT scan was performed to compare with previous scans. She is referred because of a 4.4 cm dilated ascending aorta. She has no known previous history of coronary artery disease.  She notes that she has been told that she had a stroke based on scans but does not know when this was or what the symptoms were if any. Patient's had no symptoms of congestive heart failure or angina.    Previous CT scans are reviewed with descending aorta 4.3 cm 2009 4.6 x 4.26 January 2009, 4.1 x 4.5 08/27/2009, January 2012 4.1 cm, on my measurement this appears unchanged from 2009.   Current Activity/ Functional Status:  Patient is independent with mobility/ambulation, transfers, ADL's, IADL's.  Zubrod Score: At the time of surgery this patient's most appropriate activity status/level should be described as: []  Normal activity, no symptoms [x]  Symptoms, fully ambulatory []  Symptoms, in bed less than or equal to 50% of the time []  Symptoms, in bed greater than 50% of the time but less than 100% []  Bedridden []  Moribund   Past Medical History  Diagnosis Date  . Hyperlipidemia   . Hypertension   . History of CVA (cerebrovascular accident) PER SCAN IN 2011  .  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 Allergic rhinitis   . Vaginal wall prolapse   . Mixed urge and stress incontinence   . History of Bell's palsy 2011  RIGHT SIDE -- RESOLVED  . Thoracic ascending aortic aneurysm LAST CHEST CT 09-02-2010--  FOLLOWED BY  CARDIOLOGIST--  DR FBPZWCHE  . Nocturia   . Sinus drainage   . Heart murmur ASYMPTOMATIC  . Impaired fasting glucose PER PCP  DR SIMPSON    WATCH DIET  . GERD (gastroesophageal reflux disease) OCCASIONAL  . Arthritis HIPS/ WRISTS  . DDD (degenerative disc disease), cervical     Past Surgical History  Procedure Laterality Date  . Total abdominal hysterectomy w/ bilateral salpingoophorectomy  1990  . Neck surgery  2007  . Carpal tunnel release    . Cervical disc surgery  12-12-2005  DR STERN    LEFT  C6 - C7 HERINATED / DDD/ SPONDYLOSIS  . Right partial mastectomy  11-11-2004  DR PETER YOUNG    DUCTAL CARCINOMA IN SITU RIGHT BREAST  . Cardiac catheterization  02-01-2009  DR [facility]    NORMAL CORONARY ARTERIES/ NORMAL LV SIZE AND FUNCTION/ AORTIC ROOT SIZE AT THE UPPER LIMIT OF NORMAL   .  Transthoracic echocardiogram  03-30-2008  DR MARGARET SIMPSON    LV SIZE AND FUNCTION NORMAL/ MODERATE AORTIC ARCH DILATATION/ MILD MR  . Bilateral carpal tunnel release  1980'S  . Cystocele repair  08/02/2012    Procedure: ANTERIOR REPAIR (CYSTOCELE);  Surgeon: Ailene Rud, MD;  Location: [facility];  Service: Urology;  Laterality: N/A;  [employer] Uphold Anterior Pelvic Floor Sacrospinus Repair. Anterior wall of the vagina.  . Colonoscopy N/A 02/10/2014    Procedure: COLONOSCOPY;  Surgeon: Danie Binder, MD;  Location: [facility];  Service: Endoscopy;  Laterality: N/A;  8:30  . Esophagogastroduodenoscopy N/A 02/10/2014    Procedure: ESOPHAGOGASTRODUODENOSCOPY (EGD);  Surgeon: Danie Binder, MD;  Location: [facility];  Service:  Endoscopy;  Laterality: N/A;  . Givens capsule study N/A 03/13/2014    Procedure: GIVENS CAPSULE STUDY;  Surgeon: Danie Binder, MD;  Location: [facility];  Service: Endoscopy;  Laterality: N/A;  7:30    Family History  Problem Relation Age of Onset  . Heart attack Mother 58  . Kidney failure Mother   . Hypertension Mother   . Throat cancer Father 54  . Prostate cancer Father     History   Social History  . Marital Status: Divorced    Spouse Name: N/A    Number of Children: 38  . Years of Education: N/A   Occupational History  . Not on file.   Social History Main Topics  . Smoking status: Never Smoker   . Smokeless tobacco: Never Used  . Alcohol Use: No  . Drug Use: No  . Sexual Activity: Not on file   Other Topics Concern  . 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 Never Smoker   Smokeless tobacco  . Never Used    History  Alcohol Use No     Allergies  Allergen Reactions  . Codeine Other (See Comments)    CONFUSION/ DIZZY    Current Outpatient Prescriptions  Medication Sig Dispense Refill  . aspirin EC 325 MG tablet Take 1 tablet (325 mg total) by mouth daily. 100 tablet 3  . calcium-vitamin D (OSCAL 500/200 D-3) 500-200 MG-UNIT per tablet Take 1 tablet by mouth 2 (two) times daily.     . carvedilol (COREG) 3.125 MG tablet TAKE ONE TABLET BY MOUTH TWICE DAILY WITH A MEAL 180 tablet 1  . Diphenhydramine-PSE-APAP (ALLERGY/SINUS HEADACHE PO) Take 1-2 tablets by mouth daily as needed (sinus headache).    . esomeprazole (NEXIUM) 20 MG capsule Take 20 mg by mouth daily at 12 noon.    . gabapentin (NEURONTIN) 100 MG capsule TAKE THREE CAPSULES BY MOUTH AT BEDTIME FOR PAIN IN ARM AND BACK 90 capsule 2  . hydrochlorothiazide (HYDRODIURIL) 25 MG tablet TAKE  ONE TABLET BY MOUTH EVERY MORNING FOR FLUID RETENTION 90 tablet 0  . losartan (COZAAR) 50 MG tablet TAKE ONE TABLET BY MOUTH ONCE DAILY 90  tablet 1  . lovastatin (MEVACOR) 40 MG tablet TAKE ONE TABLET BY MOUTH AT BEDTIME 90 tablet 0  . mirabegron ER (MYRBETRIQ) 50 MG TB24 tablet Take 50 mg by mouth daily.    . Multiple Vitamins-Minerals (MULTIVITAMINS THER. W/MINERALS) TABS tablet Take 1 tablet by mouth daily.    . potassium chloride (K-DUR) 10 MEQ tablet Take 10 mEq by mouth daily.    . promethazine-dextromethorphan (PROMETHAZINE-DM) 6.25-15 MG/5ML syrup Take 5 mLs  by mouth at bedtime as needed for cough. 118 mL 0  . acetaminophen (TYLENOL) 650 MG CR tablet Take 650 mg by mouth every 8 (eight) hours as needed for pain.    . TRIAMCINOLONE ACETONIDE, TOP, 0.05 % OINT Apply sparingly to affected areas twice daily as needed 17 g 3  . [DISCONTINUED] amLODipine (NORVASC) 10 MG tablet Take 1 tablet (10 mg total) by mouth daily. 90 tablet 1  . [DISCONTINUED] potassium chloride SA (KLOR-CON M20) 20 MEQ tablet Take 1 tablet (20 mEq total) by mouth daily. 60 tablet 1   No current facility-administered medications for this visit.       Review of Systems:     Cardiac Review of Systems: Y or N  Chest Pain [  n  ]  Resting SOB [n   ] Exertional SOB  Blue.Reese  ]  Orthopnea Florencio.Farrier  ]   Pedal Edema [ n  ]    Palpitations [ y ] Syncope  [ n ]   Presyncope [n  ]  General Review of Systems: [Y] = yes [  ]=no Constitional: recent weight change [n  ]; anorexia [n  ]; fatigue Blue.Reese  ]; nausea [ n ]; night sweats [n  ]; fever [ n ]; or chills [n  ];                                                                                                                                          Dental: poor dentition[ n ]; Last Dentist visit: every 6 months  Eye : blurred vision [ n ]; diplopia [ n  ]; vision changes [ n];  Amaurosis fugax[  n]; Resp: cough [n  ];  wheezing[n ];  hemoptysis[ n ]; shortness of breath[ n ]; paroxysmal nocturnal dyspnea[  n]; dyspnea on exertion[ y ]; or orthopnea[ n ];  GI:  gallstones[n  ], vomiting[  ];  dysphagia[  ]; melena[  n];   hematochezia [  ]; heartburn[  ];   Hx of  Colonoscopy[y  ]; GU: kidney stones [  ]; hematuria[  ];   dysuria [  ];  nocturia[ n ];  history of     obstruction [ n ]; urinary frequency [ n ]             Skin: rash, swelling[  ];, hair loss[  ];  peripheral edema[  ];  or itching[  ]; Musculosketetal: myalgias[  ];  joint swelling[  ];  joint erythema[  ];  joint pain[  ];  back pain[  ];  Heme/Lymph: bruising[n  ];  bleeding[  ];  anemia[  ];  Neuro: TIA[ n ];  headaches[  ];  stroke[n  ];  vertigo[  ];  seizures[  ];   paresthesias[  ];  difficulty walking[  ];  Psych:depression[  ]; anxiety[  ];  Endocrine: diabetes[  n ];  thyroid dysfunction[n  ];  Immunizations: Flu [ next week ]; Pneumococcal[ y ];  Other:  Physical Exam: BP 110/69 mmHg  Pulse 92  Ht 5\' 1"  (1.549 m)  Wt 192 lb (87.091 kg)  BMI 36.30 kg/m2  SpO2 97%  General appearance: alert, cooperative, appears stated age and no distress Neurologic: intact Heart: regular rate and rhythm, S1, S2 normal, no murmur, click, rub or gallop and normal apical impulse Lungs: clear to auscultation bilaterally and normal percussion bilaterally Abdomen: soft, non-tender; bowel sounds normal; no masses,  no organomegaly Extremities: extremities normal, atraumatic, no cyanosis or edema and Homans sign is negative, no sign of DVT Prominent right carotid pulse no carotid bruits DP and PT pulses are palpable bilaterally no cervical supraclavicular adenopathy Dg Cervical Spine Complete  Diagnostic Studies & Laboratory data:     Recent Radiology Findings: Ct Angio Chest Aorta W/cm &/or Wo/cm  06/29/2014   CLINICAL DATA:  Ascending aortic aneurysm follow-up.  EXAM: CT ANGIOGRAPHY CHEST WITH CONTRAST  TECHNIQUE: Multidetector CT imaging of the chest was performed using the standard protocol during bolus administration of intravenous contrast. Multiplanar CT image reconstructions and MIPs were obtained to evaluate the vascular anatomy.  CONTRAST:   27mL OMNIPAQUE IOHEXOL 350 MG/ML SOLN  COMPARISON:  06/08/2013  FINDINGS: THORACIC INLET/BODY WALL:  No acute abnormality.  MEDIASTINUM:  Motion artifact distorts the proximal aortic anatomy, but the exam is still diagnostic. At the sinuses, maximal aortic diameter measures 3.2 cm. At the sino-tubular junction maximal diameter is 2.9 cm. Maximal ascending aortic diameter is 4.4 cm, no larger than on the previous study. There is no evidence of inflammatory wall thickening, dissection, or recent leakage. Maximal arch diameter is 3.2 cm. There is tapering at the isthmus followed by a ductus bump, followed by a normal diameter, tortuous descending thoracic aorta. No major vessel stenosis.  Normal heart size.  No pericardial effusion.  LUNG WINDOWS:  No consolidation.  No effusion.  No suspicious pulmonary nodule.  UPPER ABDOMEN:  Two incidental hepatic cysts.  OSSEOUS:  Degenerative disc disease which is advanced in the mid thoracic region.  Review of the MIP images confirms the above findings.  IMPRESSION: Fusiform ascending aortic aneurysm measures up to 4.4 cm diameter, unchanged compared to 1 year ago.   Electronically Signed   By: Jorje Guild M.D.   On: 06/29/2014 14:51      06/06/2013   CLINICAL DATA:  Neck pain with right-sided radicular symptoms  EXAM: CERVICAL SPINE  4+ VIEWS  COMPARISON:  None.  FINDINGS: Frontal, lateral, open-mouth odontoid, and bilateral oblique views were obtained. There is no fracture or spondylolisthesis. Prevertebral soft tissues and predental space regions are normal.  There is fairly marked disc space narrowing at C4-5. There is moderate disc space narrowing at C7-T1. There is mild disc space narrowing at C3-4. There is the exit foraminal narrowing at C4-5 due to the facet hypertrophy.  There is a small cervical rib on the right.  IMPRESSION: Multilevel osteoarthritic change. No fracture or spondylolisthesis. Small cervical rib on the right.   Electronically Signed   By:  Lowella Grip M.D.   On: 06/06/2013 21:19   Dg Thoracic Spine W/swimmers  06/06/2013   CLINICAL DATA:  Pain with radicular symptoms  EXAM: THORACIC SPINE - 2 VIEW + SWIMMERS  COMPARISON:  None.  FINDINGS: Frontal, lateral, and swimmer's views were obtained. There is no fracture or spondylolisthesis. There the is multilevel disc space narrowing with several small  osteophytes consistent with osteoarthritic change. No erosive change.  IMPRESSION: Multilevel osteoarthritic change. No fracture or spondylolisthesis.   Electronically Signed   By: Lowella Grip M.D.   On: 06/06/2013 21:20   Ct Angio Chest Aorta W/cm &/or Wo/cm  06/09/2013   CLINICAL DATA:  Aneurysm  EXAM: CT ANGIOGRAPHY CHEST WITH CONTRAST  TECHNIQUE: Multidetector CT imaging of the chest was performed using the standard protocol during bolus administration of intravenous contrast. Multiplanar CT image reconstructions including MIPs were obtained to evaluate the vascular anatomy.  CONTRAST:  134mL OMNIPAQUE IOHEXOL 350 MG/ML SOLN  COMPARISON:  09/02/2010  FINDINGS: Maximal aortic diameter at the sinus of Valsalva, sino-tubular junction, and ascending aorta are 3.4 cm, 2.9 cm, and 4.5 cm respectively. Previously, the maximal diameter in the ascending aorta was 4.2 cm. No evidence of dissection.  Origins of the great vessels are patent. Right subclavian and common carotid arteries are patent. Visualized vertebral arteries are patent.  No obvious filling defect in the pulmonary arterial tree to suggest acute pulmonary thromboembolism.  No abnormal mediastinal adenopathy or pericardial effusion.  Scattered linear atelectasis in the lungs without consolidation or mass.  Degenerative disc disease within the thoracic spine. No compression deformity. Stable liver cyst.  Review of the MIP images confirms the above findings.  IMPRESSION: Ascending aortic aneurysm has increased from 4.2 cm to 4.5 cm in maximal diameter.   Electronically Signed   By:  Maryclare Bean M.D.   On: 06/09/2013 07:39    ECHO: 03/30/2008: SUMMARY - Overall left ventricular systolic function was normal. There were no left ventricular regional wall motion abnormalities. There was moderate asymmetric septal hypertrophy. - The AV leaflets themselves appear normal with normal opening. However, there are increased velocities and gradients. I do not appreciate a subvalvular membrane and there does not appear to be LVOT obstruction from ASH. Consider TEE or cardiac CT if clinically indicated for enhanced visualization. The mean transaortic valve gradient was 11 mmHg. Estimated aortic valve area (by VTI) was 1.74 cm^2. Estimated aortic valve area (by Vmax) was 1.72 cm^2. - There appears to be a linear density in the prox asc aorta without definitive color flow appreciated on doppler in this area. This is not seen in all views. Additionally, the prox asc aorta is mild to mod dilated in this region. Recommend TEE or cardiac/chest CT to eval for poss aneurysm and/or dissection if clinically indicated. There was moderate aortic arch dilatation. - There was mild mitral valvular regurgitation. The effective orifice of mitral regurgitation by proximal isovelocity surface area was 0.21 cm^2. The volume of mitral regurgitation by proximal isovelocity surface area was 26 cc. - The estimated peak right ventricular systolic pressure was mildly increased.   Recent Lab Findings: Lab Results  Component Value Date   WBC 3.4* 05/27/2014   HGB 11.5* 05/27/2014   HCT 33.9* 05/27/2014   PLT 316 05/27/2014   GLUCOSE 93 05/27/2014   CHOL 155 05/27/2014   TRIG 41 05/27/2014   HDL 76 05/27/2014   LDLCALC 71 05/27/2014   ALT 15 05/27/2014   AST 27 05/27/2014   NA 142 05/27/2014   K 4.1 05/27/2014   CL 103 05/27/2014   CREATININE 1.04 05/27/2014   BUN 17 05/27/2014   CO2 30 05/27/2014   TSH 2.919 07/09/2013   HGBA1C 5.6 05/27/2014   Chronic Kidney Disease   Stage I     GFR  >90  Stage II    GFR 60-89  Stage IIIA GFR 45-59  Stage IIIB GFR 30-44  Stage IV   GFR 15-29  Stage V    GFR  <15  Lab Results  Component Value Date   CREATININE 1.04 05/27/2014   Estimated Creatinine Clearance: 52.6 mL/min (by C-G formula based on Cr of 1.04).  Aortic Size Index=     4.5 cm    /Body surface area is 1.94 meters squared. =2.36  < 2.75 cm/m2      4% risk per year 2.75 to 4.25          8% risk per year > 4.25 cm/m2    20% risk per year    Assessment / Plan:   Dilated ascending aorta 4.5 cm, stable since 2009 No family history of aortic aneurysm, aortic dissection, or sudden death at early age of unknown cause History of hypertension currently treated with a blocker and ARB Stage IIIa chronic renal disease   I discussed with the patient the diagnosis of dilated ascending aorta and risks of rupture and dissection. With  stability since 2009  and at the most 4.5 cm in size I have recommended to the patient continued observation with good blood pressure control including beta blocker.  I'll plan to see her back in 2 years. With follow up CTA of chest    Grace Isaac MD      [place] [place] [place] [address] Office [phone]   Beeper [phone]

## 2014-07-06 ENCOUNTER — Ambulatory Visit: Payer: Self-pay | Admitting: Cardiothoracic Surgery

## 2014-07-06 ENCOUNTER — Other Ambulatory Visit: Payer: Self-pay

## 2014-07-27 ENCOUNTER — Ambulatory Visit (INDEPENDENT_AMBULATORY_CARE_PROVIDER_SITE_OTHER): Payer: Commercial Managed Care - HMO | Admitting: Cardiovascular Disease

## 2014-07-27 ENCOUNTER — Encounter: Payer: Self-pay | Admitting: Cardiovascular Disease

## 2014-07-27 VITALS — BP 124/80 | HR 54 | Resp 16 | Ht 61.5 in | Wt 190.6 lb

## 2014-07-27 DIAGNOSIS — I712 Thoracic aortic aneurysm, without rupture, unspecified: Secondary | ICD-10-CM

## 2014-07-27 DIAGNOSIS — I10 Essential (primary) hypertension: Secondary | ICD-10-CM

## 2014-07-27 DIAGNOSIS — Q248 Other specified congenital malformations of heart: Secondary | ICD-10-CM

## 2014-07-27 NOTE — Progress Notes (Signed)
[name] ID: Desiree [name], female   DOB: 1946-01-22, 68 y.o.   MRN: [id number]     Reason for office visit Hypertension, aneurysm of Desiree ascending thoracic aorta  Desiree [name]'s feeling well. She has occasional atypical chest discomfort that occurs exclusively at rest and resolves promptly whether or not she takes aspirin for it. She never has exertional chest discomfort. She has a moderate aneurysm of Desiree ascending aorta with a diameter that has been stable at around 4.4 cm over Desiree last several years. An echocardiogram in Desiree past suggested some degree of left ventricular outflow tract obstruction, not valve related, but this was not confirmed on her most recent echocardiogram. She has normal left ventricular size and systolic function and no history of coronary disease. There is no evidence of calcification in Desiree distribution of Desiree coronaries on her last chest CT. She has well compensated severe systemic hypertension. She has good functional status and continues to exercise on a regular basis.  Dr. Gerhardt:"Previous CT scans are reviewed with descending aorta 4.3 cm 2009 4.6 x 4.26 January 2009, 4.1 x 4.5 08/27/2009, January 2012 4.1 cm, on my measurement this appears unchanged from 2009. " Allergies  Allergen Reactions  . Codeine Other (See Comments)    CONFUSION/ DIZZY    Current Outpatient Prescriptions  Medication Sig Dispense Refill  . acetaminophen (TYLENOL) 650 MG CR tablet Take 650 mg by mouth every 8 (eight) hours as needed for pain.    Marland Kitchen aspirin EC 325 MG tablet Take 1 tablet (325 mg total) by mouth daily. 100 tablet 3  . calcium-vitamin D (OSCAL 500/200 D-3) 500-200 MG-UNIT per tablet Take 1 tablet by mouth 2 (two) times daily.     . carvedilol (COREG) 3.125 MG tablet TAKE ONE TABLET BY MOUTH TWICE DAILY WITH A MEAL 180 tablet 1  . Diphenhydramine-PSE-APAP (ALLERGY/SINUS HEADACHE PO) Take 1-2 tablets by mouth daily as needed (sinus headache).    . esomeprazole (NEXIUM) 20 MG  capsule Take 20 mg by mouth daily at 12 noon.    . gabapentin (NEURONTIN) 100 MG capsule TAKE THREE CAPSULES BY MOUTH AT BEDTIME FOR PAIN IN ARM AND BACK 90 capsule 2  . hydrochlorothiazide (HYDRODIURIL) 25 MG tablet TAKE  ONE TABLET BY MOUTH EVERY MORNING FOR FLUID RETENTION 90 tablet 0  . losartan (COZAAR) 50 MG tablet TAKE ONE TABLET BY MOUTH ONCE DAILY 90 tablet 1  . lovastatin (MEVACOR) 40 MG tablet TAKE ONE TABLET BY MOUTH AT BEDTIME 90 tablet 0  . mirabegron ER (MYRBETRIQ) 50 MG TB24 tablet Take 50 mg by mouth daily.    . Multiple Vitamins-Minerals (MULTIVITAMINS THER. W/MINERALS) TABS tablet Take 1 tablet by mouth daily.    . potassium chloride (K-DUR) 10 MEQ tablet Take 10 mEq by mouth daily.    . promethazine-dextromethorphan (PROMETHAZINE-DM) 6.25-15 MG/5ML syrup Take 5 mLs by mouth at bedtime as needed for cough. 118 mL 0  . TRIAMCINOLONE ACETONIDE, TOP, 0.05 % OINT Apply sparingly to affected areas twice daily as needed 17 g 3  . [DISCONTINUED] amLODipine (NORVASC) 10 MG tablet Take 1 tablet (10 mg total) by mouth daily. 90 tablet 1  . [DISCONTINUED] potassium chloride SA (KLOR-CON M20) 20 MEQ tablet Take 1 tablet (20 mEq total) by mouth daily. 60 tablet 1   No current facility-administered medications for this visit.    Past Medical History  Diagnosis Date  . Hyperlipidemia   . Hypertension   . History of CVA (cerebrovascular accident) PER SCAN IN 2011  .  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 Allergic rhinitis   . Vaginal wall prolapse   . Mixed urge and stress incontinence   . History of Bell's palsy 2011  RIGHT SIDE -- RESOLVED  . Thoracic ascending aortic aneurysm LAST CHEST CT 09-02-2010--  FOLLOWED BY  CARDIOLOGIST--  DR [id number]  . Nocturia   . Sinus drainage   . Heart murmur ASYMPTOMATIC  . Impaired fasting glucose PER PCP  DR SIMPSON    WATCH DIET  . GERD (gastroesophageal reflux disease)  OCCASIONAL  . Arthritis HIPS/ WRISTS  . DDD (degenerative disc disease), cervical     Past Surgical History  Procedure Laterality Date  . Total abdominal hysterectomy w/ bilateral salpingoophorectomy  1990  . Neck surgery  2007  . Carpal tunnel release    . Cervical disc surgery  12-12-2005  DR STERN    LEFT  C6 - C7 HERINATED / DDD/ SPONDYLOSIS  . Right partial mastectomy  11-11-2004  DR PETER YOUNG    DUCTAL CARCINOMA IN SITU RIGHT BREAST  . Cardiac catheterization  02-01-2009  DR [facility]    NORMAL CORONARY ARTERIES/ NORMAL LV SIZE AND FUNCTION/ AORTIC ROOT SIZE AT Desiree UPPER LIMIT OF NORMAL   . Transthoracic echocardiogram  03-30-2008  DR MARGARET SIMPSON    LV SIZE AND FUNCTION NORMAL/ MODERATE AORTIC ARCH DILATATION/ MILD MR  . Bilateral carpal tunnel release  1980'S  . Cystocele repair  08/02/2012    Procedure: ANTERIOR REPAIR (CYSTOCELE);  Surgeon: Ailene Rud, MD;  Location: [facility];  Service: Urology;  Laterality: N/A;  [employer] Uphold Anterior Pelvic Floor Sacrospinus Repair. Anterior wall of Desiree vagina.  . Colonoscopy N/A 02/10/2014    Procedure: COLONOSCOPY;  Surgeon: Danie Binder, MD;  Location[facility];  Service: Endoscopy;  Laterality: N/A;  8:30  . Esophagogastroduodenoscopy N/A 02/10/2014    Procedure: ESOPHAGOGASTRODUODENOSCOPY (EGD);  Surgeon: Danie Binder, MD;  Location: [facility];  Service: Endoscopy;  Laterality: N/A;  . Givens capsule study N/A 03/13/2014    Procedure: GIVENS CAPSULE STUDY;  Surgeon: Danie Binder, MD;  Location: [facility];  Service: Endoscopy;  Laterality: N/A;  7:30    Family History  Problem Relation Age of Onset  . Kidney failure Mother   . Hypertension Mother   . Heart attack Mother 61  . Throat cancer Father   . Prostate cancer Father   . Cancer Father 92    lung   . Colon cancer Neg Hx   . Colon polyps Neg Hx     History   Social History  . Marital Status: Divorced     Spouse Name: N/A    Number of Children: 66  . Years of Education: N/A   Occupational History  . Not on file.   Social History Main Topics  . Smoking status: Never Smoker   . Smokeless tobacco: Never Used  . Alcohol Use: No  . Drug Use: No  . Sexual Activity: Not Currently   Other Topics Concern  . Not on file   Social History Narrative   RETIRED FROM [employer]. 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    Review of systems: Desiree [name] specifically denies any chest pain at rest, dyspnea at rest or with exertion, orthopnea, paroxysmal nocturnal dyspnea, syncope, palpitations, focal neurological deficits, intermittent claudication, lower extremity edema, unexplained weight gain, cough, hemoptysis or wheezing.  Desiree  [name] also denies abdominal pain, nausea, vomiting, dysphagia, diarrhea, constipation, polyuria, polydipsia, dysuria, hematuria, frequency, urgency, abnormal bleeding or bruising, fever, chills, unexpected weight changes, mood swings, change in skin or hair texture, change in voice quality, auditory or visual problems, allergic reactions or rashes, new musculoskeletal complaints other than usual "aches and pains".   PHYSICAL EXAM BP 124/80 mmHg  Pulse 54  Resp 16  Ht 5' 1.5" (1.562 m)  Wt 190 lb 9.6 oz (86.456 kg)  BMI 35.44 kg/m2 General: Alert, oriented x3, no distress Head: no evidence of trauma, PERRL, EOMI, no exophtalmos or lid lag, no myxedema, no xanthelasma; normal ears, nose and oropharynx Neck: normal jugular venous pulsations and no hepatojugular reflux; brisk carotid pulses without delay and no carotid bruits Chest: clear to auscultation, no signs of consolidation by percussion or palpation, normal fremitus, symmetrical and full respiratory excursions Cardiovascular: normal position and quality of Desiree apical impulse, regular rhythm, normal first and second heart sounds, no murmurs, rubs or gallops Abdomen: no tenderness or distention, no masses by palpation, no abnormal  pulsatility or arterial bruits, normal bowel sounds, no hepatosplenomegaly Extremities: no clubbing, cyanosis or edema; 2+ radial, ulnar and brachial pulses bilaterally; 2+ right femoral, posterior tibial and dorsalis pedis pulses; 2+ left femoral, posterior tibial and dorsalis pedis pulses; no subclavian or femoral bruits Neurological: grossly nonfocal   EKG: Mild sinus bradycardia, otherwise normal.  Lipid Panel     Component Value Date/Time   CHOL 155 05/27/2014 0923   TRIG 41 05/27/2014 0923   HDL 76 05/27/2014 0923   CHOLHDL 2.0 05/27/2014 0923   VLDL 8 05/27/2014 0923   LDLCALC 71 05/27/2014 0923    BMET    Component Value Date/Time   NA 142 05/27/2014 0923   K 4.1 05/27/2014 0923   CL 103 05/27/2014 0923   CO2 30 05/27/2014 0923   GLUCOSE 93 05/27/2014 0923   BUN 17 05/27/2014 0923   CREATININE 1.04 05/27/2014 0923   CREATININE 1.08 09/14/2010 2018   CALCIUM 9.6 05/27/2014 0923   GFRNONAA 56* 05/27/2014 0923   GFRNONAA 50* 11/03/2007 0910   GFRAA 64 05/27/2014 0923   GFRAA  11/03/2007 0910    >60        Desiree eGFR has been calculated using Desiree MDRD equation. This calculation has not been validated in all clinical     ASSESSMENT AND PLAN  HYPERTENSION Excellent control of severe BP.  Thoracic aortic aneurysm Relatively small aneurysm that is probably HTN related, not growing at a fast pace. Continue beta blockers and ARB as key components of BP control. Avoid isometric intense exercise, but otherwise encourage regular moderate, isotonic/aerobic exercise.  Cardiac murmur Previous echo suggested presence of outflow tract obstruction, not confirmed on subsequent echocardiograms, including Desiree most recent one.  Holli Humbles, MD, [place] [phone] office [phone] pager

## 2014-07-27 NOTE — Patient Instructions (Signed)
Your physician wants you to follow-up in: 1 year with Dr. Croitoru. You will receive a reminder letter in the mail two months in advance. If you don't receive a letter, please call our office to schedule the follow-up appointment.  

## 2014-08-03 ENCOUNTER — Other Ambulatory Visit: Payer: Self-pay | Admitting: Cardiovascular Disease

## 2014-08-03 NOTE — Telephone Encounter (Signed)
Rx was sent to pharmacy electronically. 

## 2014-08-22 ENCOUNTER — Other Ambulatory Visit: Payer: Self-pay | Admitting: Cardiovascular Disease

## 2014-08-23 NOTE — Telephone Encounter (Signed)
Rx(s) sent to pharmacy electronically.  

## 2014-08-30 ENCOUNTER — Other Ambulatory Visit: Payer: Self-pay | Admitting: Family Medicine

## 2014-09-05 ENCOUNTER — Other Ambulatory Visit: Payer: Self-pay | Admitting: Family Medicine

## 2014-09-06 ENCOUNTER — Other Ambulatory Visit: Payer: Self-pay | Admitting: Family Medicine

## 2014-09-21 ENCOUNTER — Encounter: Payer: Self-pay | Admitting: Family Medicine

## 2014-09-21 ENCOUNTER — Ambulatory Visit (INDEPENDENT_AMBULATORY_CARE_PROVIDER_SITE_OTHER): Payer: Medicare PPO | Admitting: Family Medicine

## 2014-09-21 VITALS — BP 124/76 | HR 60 | Resp 18 | Ht 61.5 in | Wt 191.0 lb

## 2014-09-21 DIAGNOSIS — E669 Obesity, unspecified: Secondary | ICD-10-CM

## 2014-09-21 DIAGNOSIS — I10 Essential (primary) hypertension: Secondary | ICD-10-CM

## 2014-09-21 DIAGNOSIS — R9402 Abnormal brain scan: Secondary | ICD-10-CM

## 2014-09-21 DIAGNOSIS — Z853 Personal history of malignant neoplasm of breast: Secondary | ICD-10-CM

## 2014-09-21 DIAGNOSIS — Z818 Family history of other mental and behavioral disorders: Secondary | ICD-10-CM

## 2014-09-21 DIAGNOSIS — E785 Hyperlipidemia, unspecified: Secondary | ICD-10-CM

## 2014-09-21 DIAGNOSIS — M542 Cervicalgia: Secondary | ICD-10-CM

## 2014-09-21 DIAGNOSIS — R4701 Aphasia: Secondary | ICD-10-CM

## 2014-09-21 DIAGNOSIS — R32 Unspecified urinary incontinence: Secondary | ICD-10-CM

## 2014-09-21 DIAGNOSIS — Z23 Encounter for immunization: Secondary | ICD-10-CM

## 2014-09-21 DIAGNOSIS — Z820 Family history of epilepsy and other diseases of the nervous system: Secondary | ICD-10-CM

## 2014-09-21 MED ORDER — LORAZEPAM 1 MG PO TABS
ORAL_TABLET | ORAL | Status: DC
Start: 2014-09-21 — End: 2015-02-15

## 2014-09-21 NOTE — Progress Notes (Signed)
Subjective:    Patient ID: Desiree [name], female    DOB: [birth year], 69 y.o.   MRN: [id number]  HPI The PT is here for follow up and re-evaluation of chronic medical conditions, medication management and review of any available recent lab and radiology data.  Preventive health is updated, specifically  Cancer screening and Immunization.   Questions or concerns regarding consultations or procedures which the PT has had in the interim are  addressed. The PT denies any adverse reactions to current medications since the last visit.  2 month h/o increased and unrelenting right sided neck and right arm pain extending to the elbow. Cannot rest on it at night difficult to sleep. States the tylenol and gabapentin help the pain, but she feels groggy talking them and wants definitive management of the problem if possible. Has ahd c spine surgery 9 yrs ago for left sided symptoms  C/o difficulty finding the correct words in the past 2 months, hs ehas had prior brain infarct on imaging , and her mother has dementia . She is concerned, MMSE at visit is excellent , with score of 28    Review of Systems See HPI Denies recent fever or chills. Denies sinus pressure, nasal congestion, ear pain or sore throat. Denies chest congestion, productive cough or wheezing. Denies chest pains, palpitations and leg swelling Denies abdominal pain, nausea, vomiting,diarrhea or constipation.   Denies dysuria, frequency, hesitancy , improved  Incontinence on current med  Denies headaches, seizures, numbness, or tingling. Denies depression, anxiety . Denies skin break down or rash.        Objective:   Physical Exam BP 124/76 mmHg  Pulse 60  Resp 18  Ht 5' 1.5" (1.562 m)  Wt 191 lb (86.637 kg)  BMI 35.51 kg/m2  SpO2 94% Patient alert and oriented and in no cardiopulmonary distress.  HEENT: No facial asymmetry, EOMI,   oropharynx pink and moist.  Neck decreased ROM no JVD, no mass.  Chest: Clear to  auscultation bilaterally.  CVS: S1, S2 no murmurs, no S3.Regular rate.  ABD: Soft non tender.   Ext: No edema  MS: Adequate ROM spine, shoulders, hips and knees.  Skin: Intact, no ulcerations or rash noted.  Psych: Good eye contact, normal affect. Memory intact not anxious or depressed appearing.  CNS: CN 2-12 intact, grade 4 power in RUE,otherwise   normal throughout.        Assessment & Plan:  Neck pain on right side worsening in past 2 months, present for over 1.5 years, disturbs sleep, has had c spine surgery for left neck symptoms 9 years ago,mild left upper extremity weakness on exam Needs MRI c spine with contrast for further eval   Essential hypertension Controlled, no change in medication DASH diet and commitment to daily physical activity for a minimum of 30 minutes discussed and encouraged, as a part of hypertension management. The importance of attaining a healthy weight is also discussed.    Hyperlipidemia LDL goal <100 Controlled, no change in medication Hyperlipidemia:Low fat diet discussed and encouraged.  Updated lab needed at/ before next visit.    Urinary incontinence Marked improvement on current med   Obesity (BMI 30-39.9) unchnaged Patient re-educated about  the importance of commitment to a  minimum of 150 minutes of exercise per week. The importance of healthy food choices with portion control discussed. Encouraged to start a food diary, count calories and to consider  joining a support group. Sample diet sheets offered. Goals set by  the patient for the next several months.      Need for vaccination with 13-polyvalent pneumococcal conjugate vaccine Vaccine administered at visit.    Expressive aphasia 2 month h/o difficulty finding words, has had abnormal brain scan in the past suggesting infarction, pos h/o dementia in mother, will rept brain scan to see if there are any changes, she also has a h/o breast cancer

## 2014-09-21 NOTE — Patient Instructions (Addendum)
Annual physical exam in 4 month, call if you need me before  Prevnar today  Your memory test is good but you are still referred for scan of brain and  Neck as discussed  Ativan is prescribed to help calm you before the test  Chem 7 today

## 2014-09-22 ENCOUNTER — Other Ambulatory Visit: Payer: Self-pay | Admitting: Family Medicine

## 2014-09-22 DIAGNOSIS — Z23 Encounter for immunization: Secondary | ICD-10-CM | POA: Insufficient documentation

## 2014-09-22 DIAGNOSIS — R4701 Aphasia: Secondary | ICD-10-CM | POA: Insufficient documentation

## 2014-09-22 DIAGNOSIS — Z853 Personal history of malignant neoplasm of breast: Secondary | ICD-10-CM

## 2014-09-22 DIAGNOSIS — M542 Cervicalgia: Secondary | ICD-10-CM

## 2014-09-22 DIAGNOSIS — Z9889 Other specified postprocedural states: Secondary | ICD-10-CM

## 2014-09-22 DIAGNOSIS — Z818 Family history of other mental and behavioral disorders: Secondary | ICD-10-CM

## 2014-09-22 LAB — BASIC METABOLIC PANEL
BUN: 18 mg/dL (ref 6–23)
CO2: 26 meq/L (ref 19–32)
Calcium: 9.6 mg/dL (ref 8.4–10.5)
Chloride: 99 mEq/L (ref 96–112)
Creat: 1.03 mg/dL (ref 0.50–1.10)
GLUCOSE: 84 mg/dL (ref 70–99)
Potassium: 3.6 mEq/L (ref 3.5–5.3)
Sodium: 138 mEq/L (ref 135–145)

## 2014-09-22 NOTE — Assessment & Plan Note (Signed)
unchnaged Patient re-educated about  the importance of commitment to a  minimum of 150 minutes of exercise per week. The importance of healthy food choices with portion control discussed. Encouraged to start a food diary, count calories and to consider  joining a support group. Sample diet sheets offered. Goals set by the patient for the next several months.    

## 2014-09-22 NOTE — Assessment & Plan Note (Signed)
2 month h/o difficulty finding words, has had abnormal brain scan in the past suggesting infarction, pos h/o dementia in mother, will rept brain scan to see if there are any changes, she also has a h/o breast cancer

## 2014-09-22 NOTE — Assessment & Plan Note (Signed)
Controlled, no change in medication Hyperlipidemia:Low fat diet discussed and encouraged.  Updated lab needed at/ before next visit.  

## 2014-09-22 NOTE — Assessment & Plan Note (Signed)
Vaccine administered at visit.  

## 2014-09-22 NOTE — Assessment & Plan Note (Signed)
Controlled, no change in medication DASH diet and commitment to daily physical activity for a minimum of 30 minutes discussed and encouraged, as a part of hypertension management. The importance of attaining a healthy weight is also discussed.  

## 2014-09-22 NOTE — Assessment & Plan Note (Signed)
Marked improvement on current med

## 2014-09-22 NOTE — Assessment & Plan Note (Addendum)
worsening in past 2 months, present for over 1.5 years, disturbs sleep, has had c spine surgery for left neck symptoms 9 years ago,mild left upper extremity weakness on exam Needs MRI c spine with contrast for further eval

## 2014-09-29 ENCOUNTER — Other Ambulatory Visit: Payer: Self-pay | Admitting: Family Medicine

## 2014-10-02 ENCOUNTER — Ambulatory Visit (HOSPITAL_COMMUNITY)
Admission: RE | Admit: 2014-10-02 | Discharge: 2014-10-02 | Disposition: A | Discharge status: Home / Self Care | Payer: Commercial Managed Care - HMO | Source: Ambulatory Visit | Attending: Family Medicine | Admitting: Family Medicine

## 2014-10-02 DIAGNOSIS — R413 Other amnesia: Secondary | ICD-10-CM | POA: Insufficient documentation

## 2014-10-02 DIAGNOSIS — Z818 Family history of other mental and behavioral disorders: Secondary | ICD-10-CM

## 2014-10-02 DIAGNOSIS — Z9889 Other specified postprocedural states: Secondary | ICD-10-CM

## 2014-10-02 DIAGNOSIS — M542 Cervicalgia: Secondary | ICD-10-CM

## 2014-10-02 DIAGNOSIS — R4701 Aphasia: Secondary | ICD-10-CM

## 2014-10-02 DIAGNOSIS — Z853 Personal history of malignant neoplasm of breast: Secondary | ICD-10-CM | POA: Diagnosis not present

## 2014-10-02 DIAGNOSIS — R531 Weakness: Secondary | ICD-10-CM | POA: Insufficient documentation

## 2014-10-02 MED ORDER — GADOBENATE DIMEGLUMINE 529 MG/ML IV SOLN
18.0000 mL | Freq: Once | INTRAVENOUS | Status: AC | PRN
  Administered 2014-10-02: 15 mL via INTRAVENOUS

## 2014-10-23 ENCOUNTER — Other Ambulatory Visit: Payer: Self-pay | Admitting: Family Medicine

## 2014-11-07 ENCOUNTER — Encounter: Payer: Self-pay | Admitting: Family Medicine

## 2014-11-07 ENCOUNTER — Ambulatory Visit (INDEPENDENT_AMBULATORY_CARE_PROVIDER_SITE_OTHER): Payer: Commercial Managed Care - HMO | Admitting: Family Medicine

## 2014-11-07 VITALS — BP 138/84 | HR 81 | Temp 99.1°F | Resp 16 | Ht 61.5 in | Wt 195.1 lb

## 2014-11-07 DIAGNOSIS — R110 Nausea: Secondary | ICD-10-CM | POA: Diagnosis not present

## 2014-11-07 DIAGNOSIS — M5410 Radiculopathy, site unspecified: Secondary | ICD-10-CM

## 2014-11-07 DIAGNOSIS — I10 Essential (primary) hypertension: Secondary | ICD-10-CM

## 2014-11-07 DIAGNOSIS — N3000 Acute cystitis without hematuria: Secondary | ICD-10-CM | POA: Diagnosis not present

## 2014-11-07 DIAGNOSIS — K2970 Gastritis, unspecified, without bleeding: Secondary | ICD-10-CM | POA: Diagnosis not present

## 2014-11-07 LAB — POCT URINALYSIS DIPSTICK
BILIRUBIN UA: NEGATIVE
Blood, UA: NEGATIVE
Glucose, UA: NEGATIVE
KETONES UA: NEGATIVE
LEUKOCYTES UA: NEGATIVE
Nitrite, UA: NEGATIVE
Protein, UA: NEGATIVE
Spec Grav, UA: 1.02
Urobilinogen, UA: 0.2
pH, UA: 7

## 2014-11-07 MED ORDER — OMEPRAZOLE 40 MG PO CPDR: 40.0000 mg | DELAYED_RELEASE_CAPSULE | Freq: Every day | ORAL | Status: DC

## 2014-11-07 MED ORDER — ONDANSETRON HCL 4 MG PO TABS
4.0000 mg | ORAL_TABLET | Freq: Three times a day (TID) | ORAL | Status: DC | PRN
Start: 2014-11-07 — End: 2015-02-15

## 2014-11-07 MED ORDER — ONDANSETRON HCL 4 MG/2ML IJ SOLN
4.0000 mg | Freq: Once | INTRAMUSCULAR | Status: AC
  Administered 2014-11-07: 4 mg via INTRAMUSCULAR

## 2014-11-07 MED ORDER — IBUPROFEN 800 MG PO TABS: 800.0000 mg | ORAL_TABLET | Freq: Three times a day (TID) | ORAL | Status: DC | PRN

## 2014-11-07 MED ORDER — METHYLPREDNISOLONE ACETATE 80 MG/ML IJ SUSP
80.0000 mg | Freq: Once | INTRAMUSCULAR | Status: AC
  Administered 2014-11-07: 80 mg via INTRAMUSCULAR

## 2014-11-07 MED ORDER — PREDNISONE (PAK) 5 MG PO TABS: 5.0000 mg | ORAL_TABLET | ORAL | Status: DC

## 2014-11-07 MED ORDER — KETOROLAC TROMETHAMINE 60 MG/2ML IM SOLN
60.0000 mg | Freq: Once | INTRAMUSCULAR | Status: AC
Start: 2014-11-07 — End: 2014-11-07
  Administered 2014-11-07: 60 mg via INTRAMUSCULAR

## 2014-11-07 NOTE — Assessment & Plan Note (Addendum)
Acute flare, symptomatic treatment and advised pt to ensure adequate hydration

## 2014-11-07 NOTE — Assessment & Plan Note (Signed)
zofran in office and medication sent

## 2014-11-07 NOTE — Progress Notes (Signed)
   Subjective:    Patient ID: Desiree [name], female    DOB: 1945-11-17, 69 y.o.   MRN: [id number]  HPI Acute onset of right buttock pain to right groin after walking outside for  approx 20 mi ns. Has worsened was 11 last night, denies incontinence of stool or urine, no lower extremity symptoms, reports right groin swelling  Chills, generalized aches today, nausea this morning , and stool more loose Denies urinary symptoms except for frequency which she has chronically   Review of Systems See HPI Denies recent fever or chills. Denies sinus pressure, nasal congestion, ear pain or sore throat. Denies chest congestion, productive cough or wheezing. Denies chest pains, palpitations and leg swelling .   Denies dysuria,  Denies headaches, seizures, numbness, or tingling. Denies depression, anxiety or insomnia. Denies skin break down or rash.        Objective:   Physical Exam BP 138/84 mmHg  Pulse 81  Temp(Src) 99.1 F (37.3 C) (Oral)  Resp 16  Ht 5' 1.5" (1.562 m)  Wt 195 lb 1.9 oz (88.506 kg)  BMI 36.28 kg/m2  SpO2 98% Patient alert and oriented and in no cardiopulmonary distress.  HEENT: No facial asymmetry, EOMI,   oropharynx pink and moist.  Neck supple no JVD, no mass.  Chest: Clear to auscultation bilaterally.  CVS: S1, S2 no murmurs, no S3.Regular rate.  ABD: Soft  Generalized superficial tenderness , hyperactive bowel sounds, no guarding or rebound   Ext: No edema  MS: Decreased ROM lumbar spine, normal in  shoulders, hips and knees.  Skin: Intact, no ulcerations or rash noted.  Psych: Good eye contact, normal affect. Memory intact not anxious or depressed appearing.  CNS: CN 2-12 intact, power,  normal throughout.no focal deficits noted.        Assessment & Plan:  Back pain with right-sided radiculopathy Uncontrolled.Toradol and depo medrol administered IM in the office , to be followed by a short course of oral prednisone and NSAIDS. Acute flare x  1 week  Nausea without vomiting zofran in office and medication sent  Gastritis Acute flare, symptomatic treatment and advised pt to ensure adequate hydration  Essential hypertension Controlled, no change in medication DASH diet and commitment to daily physical activity for a minimum of 30 minutes discussed and encouraged, as a part of hypertension management. The importance of attaining a healthy weight is also discussed.  BP/Weight 11/07/2014 09/21/2014 07/27/2014 06/29/2014 05/22/2014 03/13/2014 12/08/6061  Systolic BP 016 010 932 355 732 - 202  Diastolic BP 84 76 80 69 70 - 60  Wt. (Lbs) 195.12 191 190.6 192 188 191 191  BMI 36.28 35.51 35.44 36.3 34.95 35.51 36.11        Acute cystitis without hematuria Frequency with back pain , normal UA, reassured of no UTI

## 2014-11-07 NOTE — Patient Instructions (Signed)
Annual exam as before   You are treated for acute flare of  Back pain, due to arthritis and disc disease, please call back if not better  Within the next  week, as you will need further assessment  You are treated for nausea and possible mild gastroenteritis, ensure adequate fluid intake, call back for worsening pain

## 2014-11-07 NOTE — Assessment & Plan Note (Signed)
Uncontrolled.Toradol and depo medrol administered IM in the office , to be followed by a short course of oral prednisone and NSAIDS. Acute flare x 1 week

## 2014-12-13 ENCOUNTER — Other Ambulatory Visit: Payer: Self-pay | Admitting: Family Medicine

## 2014-12-26 ENCOUNTER — Other Ambulatory Visit: Payer: Self-pay | Admitting: Family Medicine

## 2014-12-26 DIAGNOSIS — Z1231 Encounter for screening mammogram for malignant neoplasm of breast: Secondary | ICD-10-CM

## 2015-01-01 ENCOUNTER — Other Ambulatory Visit: Payer: Self-pay | Admitting: Family Medicine

## 2015-01-10 ENCOUNTER — Ambulatory Visit (HOSPITAL_COMMUNITY)
Admission: RE | Admit: 2015-01-10 | Discharge: 2015-01-10 | Disposition: A | Discharge status: Home / Self Care | Payer: Commercial Managed Care - HMO | Source: Ambulatory Visit | Attending: Family Medicine | Admitting: Family Medicine

## 2015-01-10 DIAGNOSIS — Z1231 Encounter for screening mammogram for malignant neoplasm of breast: Secondary | ICD-10-CM | POA: Insufficient documentation

## 2015-02-02 DIAGNOSIS — N3000 Acute cystitis without hematuria: Secondary | ICD-10-CM | POA: Insufficient documentation

## 2015-02-02 NOTE — Assessment & Plan Note (Signed)
Controlled, no change in medication DASH diet and commitment to daily physical activity for a minimum of 30 minutes discussed and encouraged, as a part of hypertension management. The importance of attaining a healthy weight is also discussed.  BP/Weight 11/07/2014 09/21/2014 07/27/2014 06/29/2014 05/22/2014 03/13/2014 4/40/1027  Systolic BP 253 664 403 474 259 - 563  Diastolic BP 84 76 80 69 70 - 60  Wt. (Lbs) 195.12 191 190.6 192 188 191 191  BMI 36.28 35.51 35.44 36.3 34.95 35.51 36.11

## 2015-02-02 NOTE — Assessment & Plan Note (Signed)
Frequency with back pain , normal UA, reassured of no UTI

## 2015-02-15 ENCOUNTER — Encounter: Payer: Self-pay | Admitting: Family Medicine

## 2015-02-15 ENCOUNTER — Ambulatory Visit (INDEPENDENT_AMBULATORY_CARE_PROVIDER_SITE_OTHER): Payer: Commercial Managed Care - HMO | Admitting: Family Medicine

## 2015-02-15 VITALS — BP 106/68 | HR 100 | Resp 18 | Ht 61.5 in | Wt 193.0 lb

## 2015-02-15 DIAGNOSIS — I10 Essential (primary) hypertension: Secondary | ICD-10-CM

## 2015-02-15 DIAGNOSIS — Z1211 Encounter for screening for malignant neoplasm of colon: Secondary | ICD-10-CM

## 2015-02-15 DIAGNOSIS — Q248 Other specified congenital malformations of heart: Secondary | ICD-10-CM

## 2015-02-15 DIAGNOSIS — N39498 Other specified urinary incontinence: Secondary | ICD-10-CM

## 2015-02-15 DIAGNOSIS — E8881 Metabolic syndrome: Secondary | ICD-10-CM

## 2015-02-15 DIAGNOSIS — I712 Thoracic aortic aneurysm, without rupture, unspecified: Secondary | ICD-10-CM

## 2015-02-15 DIAGNOSIS — E669 Obesity, unspecified: Secondary | ICD-10-CM

## 2015-02-15 DIAGNOSIS — M1711 Unilateral primary osteoarthritis, right knee: Secondary | ICD-10-CM

## 2015-02-15 DIAGNOSIS — Z0000 Encounter for general adult medical examination without abnormal findings: Secondary | ICD-10-CM | POA: Diagnosis not present

## 2015-02-15 DIAGNOSIS — E785 Hyperlipidemia, unspecified: Secondary | ICD-10-CM

## 2015-02-15 DIAGNOSIS — R7301 Impaired fasting glucose: Secondary | ICD-10-CM

## 2015-02-15 DIAGNOSIS — M129 Arthropathy, unspecified: Secondary | ICD-10-CM

## 2015-02-15 LAB — HEMOCCULT GUIAC POC 1CARD (OFFICE): FECAL OCCULT BLD: NEGATIVE

## 2015-02-15 NOTE — Patient Instructions (Signed)
Annual wellness in 5 month, call if you need me before  No changes in medication  Fasting labs as soon as possible please  Please work on good  health habits so that your health will improve. 1. Commitment to daily physical activity for 30 to 60  minutes, if you are able to do this.  2. Commitment to wise food choices. Aim for half of your  food intake to be vegetable and fruit, one quarter starchy foods, and one quarter protein. Try to eat on a regular schedule  3 meals per day, snacking between meals should be limited to vegetables or fruits or small portions of nuts. 64 ounces of water per day is generally recommended, unless you have specific health conditions, like heart failure or kidney failure where you will need to limit fluid intake.  3. Commitment to sufficient and a  good quality of physical and mental rest daily, generally between 6 to 8 hours per day.  WITH PERSISTANCE AND PERSEVERANCE, THE IMPOSSIBLE , BECOMES THE NORM!

## 2015-02-17 NOTE — Assessment & Plan Note (Signed)
Last evaluated by vascular surgery in 06/2014, has 2  Year gf/u planned, stable with a 44.5 cm diameter, she is maintained on a beta blocker and BP is well controlled Asymptomatic, no chest pain

## 2015-02-17 NOTE — Assessment & Plan Note (Signed)
Intermittent flares of acute pain , uses ibuprofen with caution, encouraged to rely on tylenol as a first line med when needed

## 2015-02-17 NOTE — Progress Notes (Signed)
Subjective:    Patient ID: Desiree [name], female    DOB: Aug 13, 1946, 69 y.o.   MRN: [id number]  HPI Patient is in for annual physical exam. No other health concerns are expressed  at the visit.  Immunization is reviewed ,    Review of Systems See HPI     Objective:   Physical Exam  BP 106/68 mmHg  Pulse 100  Resp 18  Ht 5' 1.5" (1.562 m)  Wt 193 lb 0.6 oz (87.562 kg)  BMI 35.89 kg/m2  SpO2 100% . Pleasant well nourished female, alert and oriented x 3, in no cardio-pulmonary distress. Afebrile. HEENT No facial trauma or asymetry. Sinuses non tender.  Extra occullar muscles intact, pupils equally reactive to light. External ears normal, tympanic membranes clear. Oropharynx moist, no exudate, good dentition. Neck: supple, no adenopathy,JVD or thyromegaly.No bruits.  Chest: Clear to ascultation bilaterally.No crackles or wheezes. Non tender to palpation  Breast: No asymetry,no masses or lumps. No tenderness. No nipple discharge or inversion. No axillary or supraclavicular adenopathy  Cardiovascular system; Heart sounds normal,  S1 and  S2 ,no S3.  No murmur, or thrill. Apical beat not displaced Peripheral pulses normal.  Abdomen: Soft, non tender, no organomegaly or masses. No bruits. Bowel sounds normal. No guarding, tenderness or rebound.  Rectal:  Normal sphincter tone. No mass.No rectal masses.  Guaiac negative stool.  GU: External genitalia normal female genitalia , female distribution of hair. No lesions. Urethral meatus normal in size, no  Prolapse, no lesions visibly  Present. Bladder non tender. Vagina pink and moist , with no visible lesions , discharge present . Adequate pelvic support no  cystocele or rectocele noted Uterus absent , no adnexal masses, no  adnexal tenderness.   Musculoskeletal exam: Full ROM of spine, hips , shoulders and knees. Mild  deformity ,and crepitus noted in left knee No muscle wasting or atrophy.    Neurologic: Cranial nerves 2 to 12 intact. Power, tone ,sensation and reflexes normal throughout. No disturbance in gait. No tremor.  Skin: Intact, no ulceration, erythema , scaling or rash noted. Pigmentation normal throughout  Psych; Normal mood and affect. Judgement and concentration normal       Assessment & Plan:  Annual physical exam Annual exam as documented. Counseling done  re healthy lifestyle involving commitment to 150 minutes exercise per week, heart healthy diet, and attaining healthy weight.The importance of adequate sleep also discussed. Regular seat belt use and home safety, is also discussed. Changes in health habits are decided on by the patient with goals and time frames  set for achieving them. Immunization and cancer screening needs are specifically addressed at this visit.   Essential hypertension Controlled, no change in medication DASH diet and commitment to daily physical activity for a minimum of 30 minutes discussed and encouraged, as a part of hypertension management. The importance of attaining a healthy weight is also discussed.  BP/Weight 02/15/2015 11/07/2014 09/21/2014 07/27/2014 06/29/2014 05/22/2014 02/19/3661  Systolic BP 947 654 650 354 656 812 -  Diastolic BP 68 84 76 80 69 70 -  Wt. (Lbs) 193.04 195.12 191 190.6 192 188 191  BMI 35.89 36.28 35.51 35.44 36.3 34.95 35.51        Thoracic aortic aneurysm Last evaluated by vascular surgery in 06/2014, has 2  Year gf/u planned, stable with a 44.5 cm diameter, she is maintained on a beta blocker and BP is well controlled Asymptomatic, no chest pain  Left ventricular outflow tract obstruction Followed  by cardiology  Impaired fasting glucose Updated lab needed at/ before next visit. . Patient educated about the importance of limiting  Carbohydrate intake , the need to commit to daily physical activity for a minimum of 30 minutes , and to commit weight loss. The fact that changes in all  these areas will reduce or eliminate all together the development of diabetes is stressed.  Had corrected when last checked, will follow Diabetic Labs Latest Ref Rng 09/21/2014 05/27/2014 11/26/2013 07/09/2013 06/06/2013  HbA1c <5.7 % - 5.6 5.7(H) - -  Chol 0 - 200 mg/dL - 155 - 180 -  HDL >39 mg/dL - 76 - 78 -  Calc LDL 0 - 99 mg/dL - 71 - 95 -  Triglycerides <150 mg/dL - 41 - 37 -  Creatinine 0.50 - 1.10 mg/dL 1.03 1.04 1.16(H) 1.17(H) 1.21(H)   BP/Weight 02/15/2015 11/07/2014 09/21/2014 07/27/2014 06/29/2014 05/22/2014 6/38/4665  Systolic BP 993 570 177 939 030 092 -  Diastolic BP 68 84 76 80 69 70 -  Wt. (Lbs) 193.04 195.12 191 190.6 192 188 191  BMI 35.89 36.28 35.51 35.44 36.3 34.95 35.51   No flowsheet data found.     Urinary incontinence Good response to current med, continue same  Obesity (BMI 30-39.9) Improved Patient re-educated about  the importance of commitment to a  minimum of 150 minutes of exercise per week.  The importance of healthy food choices with portion control discussed. Encouraged to start a food diary, count calories and to consider  joining a support group. Sample diet sheets offered. Goals set by the patient for the next several months.   Weight /BMI 02/15/2015 11/07/2014 09/21/2014  WEIGHT 193 lb 0.6 oz 195 lb 1.9 oz 191 lb  HEIGHT 5' 1.5" 5' 1.5" 5' 1.5"  BMI 35.89 kg/m2 36.28 kg/m2 35.51 kg/m2    Current exercise per week 90 minutes.   Metabolic syndrome X The increased risk of cardiovascular disease associated with this diagnosis, and the need to consistently work on lifestyle to change this is discussed. Following  a  heart healthy diet ,commitment to 30 minutes of exercise at least 5 days per week, as well as control of blood sugar and cholesterol , and achieving a healthy weight are all the areas to be addressed .   Arthritis of knee, right Intermittent flares of acute pain , uses ibuprofen with caution, encouraged to rely on tylenol as a first  line med when needed

## 2015-02-17 NOTE — Assessment & Plan Note (Signed)
Good response to current med, continue same

## 2015-02-17 NOTE — Assessment & Plan Note (Signed)
Improved Patient re-educated about  the importance of commitment to a  minimum of 150 minutes of exercise per week.  The importance of healthy food choices with portion control discussed. Encouraged to start a food diary, count calories and to consider  joining a support group. Sample diet sheets offered. Goals set by the patient for the next several months.   Weight /BMI 02/15/2015 11/07/2014 09/21/2014  WEIGHT 193 lb 0.6 oz 195 lb 1.9 oz 191 lb  HEIGHT 5' 1.5" 5' 1.5" 5' 1.5"  BMI 35.89 kg/m2 36.28 kg/m2 35.51 kg/m2    Current exercise per week 90 minutes.

## 2015-02-17 NOTE — Assessment & Plan Note (Signed)
The increased risk of cardiovascular disease associated with this diagnosis, and the need to consistently work on lifestyle to change this is discussed. Following  a  heart healthy diet ,commitment to 30 minutes of exercise at least 5 days per week, as well as control of blood sugar and cholesterol , and achieving a healthy weight are all the areas to be addressed .  

## 2015-02-17 NOTE — Assessment & Plan Note (Signed)
Updated lab needed at/ before next visit. . Patient educated about the importance of limiting  Carbohydrate intake , the need to commit to daily physical activity for a minimum of 30 minutes , and to commit weight loss. The fact that changes in all these areas will reduce or eliminate all together the development of diabetes is stressed.  Had corrected when last checked, will follow Diabetic Labs Latest Ref Rng 09/21/2014 05/27/2014 11/26/2013 07/09/2013 06/06/2013  HbA1c <5.7 % - 5.6 5.7(H) - -  Chol 0 - 200 mg/dL - 155 - 180 -  HDL >39 mg/dL - 76 - 78 -  Calc LDL 0 - 99 mg/dL - 71 - 95 -  Triglycerides <150 mg/dL - 41 - 37 -  Creatinine 0.50 - 1.10 mg/dL 1.03 1.04 1.16(H) 1.17(H) 1.21(H)   BP/Weight 02/15/2015 11/07/2014 09/21/2014 07/27/2014 06/29/2014 05/22/2014 01/28/7702  Systolic BP 403 524 818 590 931 121 -  Diastolic BP 68 84 76 80 69 70 -  Wt. (Lbs) 193.04 195.12 191 190.6 192 188 191  BMI 35.89 36.28 35.51 35.44 36.3 34.95 35.51   No flowsheet data found.

## 2015-02-17 NOTE — Assessment & Plan Note (Signed)
Followed by cardiology 

## 2015-02-17 NOTE — Assessment & Plan Note (Signed)
Controlled, no change in medication DASH diet and commitment to daily physical activity for a minimum of 30 minutes discussed and encouraged, as a part of hypertension management. The importance of attaining a healthy weight is also discussed.  BP/Weight 02/15/2015 11/07/2014 09/21/2014 07/27/2014 06/29/2014 05/22/2014 05/08/4457  Systolic BP 483 507 573 225 672 091 -  Diastolic BP 68 84 76 80 69 70 -  Wt. (Lbs) 193.04 195.12 191 190.6 192 188 191  BMI 35.89 36.28 35.51 35.44 36.3 34.95 35.51

## 2015-02-17 NOTE — Assessment & Plan Note (Signed)

## 2015-02-19 ENCOUNTER — Other Ambulatory Visit: Payer: Self-pay | Admitting: Family Medicine

## 2015-02-20 LAB — CBC
HEMATOCRIT: 32.4 % — AB (ref 36.0–46.0)
HEMOGLOBIN: 10.4 g/dL — AB (ref 12.0–15.0)
MCH: 27.8 pg (ref 26.0–34.0)
MCHC: 32.1 g/dL (ref 30.0–36.0)
MCV: 86.6 fL (ref 78.0–100.0)
MPV: 9.4 fL (ref 8.6–12.4)
Platelets: 304 10*3/uL (ref 150–400)
RBC: 3.74 MIL/uL — ABNORMAL LOW (ref 3.87–5.11)
RDW: 14.8 % (ref 11.5–15.5)
WBC: 4.1 10*3/uL (ref 4.0–10.5)

## 2015-02-20 LAB — COMPREHENSIVE METABOLIC PANEL
ALBUMIN: 3.6 g/dL (ref 3.5–5.2)
ALT: 13 U/L (ref 0–35)
AST: 27 U/L (ref 0–37)
Alkaline Phosphatase: 74 U/L (ref 39–117)
BUN: 17 mg/dL (ref 6–23)
CO2: 28 meq/L (ref 19–32)
Calcium: 9.1 mg/dL (ref 8.4–10.5)
Chloride: 101 mEq/L (ref 96–112)
Creat: 1.02 mg/dL (ref 0.50–1.10)
GLUCOSE: 80 mg/dL (ref 70–99)
POTASSIUM: 3.7 meq/L (ref 3.5–5.3)
Sodium: 141 mEq/L (ref 135–145)
Total Bilirubin: 0.6 mg/dL (ref 0.2–1.2)
Total Protein: 6.4 g/dL (ref 6.0–8.3)

## 2015-02-20 LAB — LIPID PANEL
CHOL/HDL RATIO: 1.8 ratio
CHOLESTEROL: 144 mg/dL (ref 0–200)
HDL: 82 mg/dL (ref >=46)
LDL CALC: 52 mg/dL (ref 0–99)
Triglycerides: 49 mg/dL (ref <150)
VLDL: 10 mg/dL (ref 0–40)

## 2015-02-20 LAB — FERRITIN: FERRITIN: 156 ng/mL (ref 10–291)

## 2015-02-20 LAB — TSH: TSH: 2.582 u[IU]/mL (ref 0.350–4.500)

## 2015-02-20 LAB — IRON: IRON: 69 ug/dL (ref 42–145)

## 2015-03-15 ENCOUNTER — Other Ambulatory Visit: Payer: Self-pay | Admitting: Family Medicine

## 2015-03-29 ENCOUNTER — Other Ambulatory Visit: Payer: Self-pay | Admitting: Family Medicine

## 2015-04-03 ENCOUNTER — Other Ambulatory Visit: Payer: Self-pay | Admitting: Family Medicine

## 2015-06-12 ENCOUNTER — Ambulatory Visit (INDEPENDENT_AMBULATORY_CARE_PROVIDER_SITE_OTHER): Payer: Commercial Managed Care - HMO

## 2015-06-12 DIAGNOSIS — Z23 Encounter for immunization: Secondary | ICD-10-CM

## 2015-06-21 ENCOUNTER — Other Ambulatory Visit: Payer: Self-pay | Admitting: Family Medicine

## 2015-07-03 ENCOUNTER — Encounter: Payer: Self-pay | Admitting: Cardiovascular Disease

## 2015-07-17 ENCOUNTER — Other Ambulatory Visit: Payer: Self-pay | Admitting: Family Medicine

## 2015-08-01 ENCOUNTER — Encounter: Payer: Self-pay | Admitting: Cardiovascular Disease

## 2015-08-01 ENCOUNTER — Ambulatory Visit (INDEPENDENT_AMBULATORY_CARE_PROVIDER_SITE_OTHER): Payer: Commercial Managed Care - HMO | Admitting: Cardiovascular Disease

## 2015-08-01 VITALS — BP 112/74 | HR 61 | Ht 61.5 in | Wt 184.1 lb

## 2015-08-01 DIAGNOSIS — E669 Obesity, unspecified: Secondary | ICD-10-CM

## 2015-08-01 DIAGNOSIS — I10 Essential (primary) hypertension: Secondary | ICD-10-CM | POA: Diagnosis not present

## 2015-08-01 DIAGNOSIS — E785 Hyperlipidemia, unspecified: Secondary | ICD-10-CM

## 2015-08-01 DIAGNOSIS — I712 Thoracic aortic aneurysm, without rupture, unspecified: Secondary | ICD-10-CM

## 2015-08-01 DIAGNOSIS — R0602 Shortness of breath: Secondary | ICD-10-CM | POA: Diagnosis not present

## 2015-08-01 NOTE — Patient Instructions (Signed)
Non-Cardiac CT Angiography (CTA), is a special type of CT scan that uses a computer to produce multi-dimensional views of major blood vessels throughout the body IN ONE YEAR. In CT angiography, a contrast material is injected through an IV to help visualize the blood vessels.  Dr. [name] recommends that you schedule a follow-up appointment in: [place]

## 2015-08-01 NOTE — Progress Notes (Signed)
Patient ID: Desiree [name], female   DOB: February 08, 1946, 69 y.o.   MRN: [id number]      Cardiology Office Note   Date:  08/02/2015   ID:  Desiree [name], Desiree [name] September 11, 1945, MRN [id number]  PCP:  Tula Nakayama, MD  Cardiologist:   [name], MD   Chief Complaint  Patient presents with  . Annual Exam    no chest pain, occassional shortness of breath, no edema, no pain or cramping in legs, no lightheaded or dizziness      History of Present Illness: Desiree [name] is a 69 y.o. female who presents for  Follow-up for moderate-sized ascending aortic aneurysm, systemic hypertension , hyperlipidemia  The patient specifically denies any chest pain at rest exertion, dyspnea at rest or with exertion, orthopnea, paroxysmal nocturnal dyspnea, syncope, palpitations, focal neurological deficits, intermittent claudication, lower extremity edema, unexplained weight gain, cough, hemoptysis or wheezing.   Has not had any new major health challenges since her last appointment.  She has had problems with neck pain and back pain.  She has a moderate aneurysm of the ascending aorta with a diameter that has been stable at around 4.4 cm over the last several years. An echocardiogram in the past suggested some degree of left ventricular outflow tract obstruction, not valve related, but this was not confirmed on her most recent echocardiogram. She has normal left ventricular size and systolic function and no history of coronary disease. There is no evidence of calcification in the distribution of the coronaries on her last chest CT. She has well compensated severe systemic hypertension. She has good functional status and continues to exercise on a regular basis.  Dr. Gerhardt:"Previous CT scans are reviewed with descending aorta 4.3 cm 2009 4.6 x 4.26 January 2009, 4.1 x 4.5 08/27/2009, January 2012 4.1 cm, on my measurement this appears unchanged from 2009. "  Past Medical History  Diagnosis Date  .  Hyperlipidemia   . Hypertension   . History of CVA (cerebrovascular accident) PER SCAN IN 2011  . 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 Allergic rhinitis   . Vaginal wall prolapse   . Mixed urge and stress incontinence   . History of Bell's palsy 2011  RIGHT SIDE -- RESOLVED  . Thoracic ascending aortic aneurysm ([place]) LAST CHEST CT 09-02-2010--  FOLLOWED BY  CARDIOLOGIST--  DR NO:[id number]  . Nocturia   . Sinus drainage   . Heart murmur ASYMPTOMATIC  . Impaired fasting glucose PER PCP  DR SIMPSON    WATCH DIET  . GERD (gastroesophageal reflux disease) OCCASIONAL  . Arthritis HIPS/ WRISTS  . DDD (degenerative disc disease), cervical     Past Surgical History  Procedure Laterality Date  . Total abdominal hysterectomy w/ bilateral salpingoophorectomy  1990  . Neck surgery  2007  . Carpal tunnel release    . Cervical disc surgery  12-12-2005  DR STERN    LEFT  C6 - C7 HERINATED / DDD/ SPONDYLOSIS  . Right partial mastectomy  11-11-2004  DR PETER YOUNG    DUCTAL CARCINOMA IN SITU RIGHT BREAST  . Cardiac catheterization  02-01-2009  DR [facility]    NORMAL CORONARY ARTERIES/ NORMAL LV SIZE AND FUNCTION/ AORTIC ROOT SIZE AT THE UPPER LIMIT OF NORMAL   . Transthoracic echocardiogram  03-30-2008  DR MARGARET SIMPSON    LV SIZE AND FUNCTION NORMAL/ MODERATE AORTIC ARCH DILATATION/ MILD MR  . Bilateral carpal  tunnel release  1980'S  . Cystocele repair  08/02/2012    Procedure: ANTERIOR REPAIR (CYSTOCELE);  Surgeon: Ailene Rud, MD;  Location: [facility];  Service: Urology;  Laterality: N/A;  [employer] Uphold Anterior Pelvic Floor Sacrospinus Repair. Anterior wall of the vagina.  . Colonoscopy N/A 02/10/2014    Procedure: COLONOSCOPY;  Surgeon: Danie Binder, MD;  [facility];  Service: Endoscopy;  Laterality: N/A;  8:30  . Esophagogastroduodenoscopy N/A 02/10/2014    Procedure:  ESOPHAGOGASTRODUODENOSCOPY (EGD);  Surgeon: Danie Binder, MD;  Location: [facility];  Service: Endoscopy;  Laterality: N/A;  . Givens capsule study N/A 03/13/2014    Procedure: GIVENS CAPSULE STUDY;  Surgeon: Danie Binder, MD;  Location: [facility];  Service: Endoscopy;  Laterality: N/A;  7:30     Current Outpatient Prescriptions  Medication Sig Dispense Refill  . acetaminophen (TYLENOL) 650 MG CR tablet Take 650 mg by mouth every 8 (eight) hours as needed for pain.    Marland Kitchen aspirin EC 325 MG tablet Take 1 tablet (325 mg total) by mouth daily. 100 tablet 3  . calcium-vitamin D (OSCAL 500/200 D-3) 500-200 MG-UNIT per tablet Take 1 tablet by mouth 2 (two) times daily.     . carvedilol (COREG) 3.125 MG tablet TAKE 1 TABLET BY MOUTH TWICE DAILY WITH A MEAL 180 tablet 3  . cholecalciferol (VITAMIN D) 1000 UNITS tablet Take 1,000 Units by mouth daily.    . Diphenhydramine-PSE-APAP (ALLERGY/SINUS HEADACHE PO) Take 1-2 tablets by mouth daily as needed (sinus headache).    . gabapentin (NEURONTIN) 100 MG capsule TAKE THREE CAPSULES BY MOUTH ONCE DAILY AT BEDTIME FOR  PAIN  IN  THE  ARM  AND  BACK 90 capsule 2  . hydrochlorothiazide (HYDRODIURIL) 25 MG tablet TAKE ONE TABLET BY MOUTH EVERY MORNING FOR  FLUID  RETENTION 90 tablet 3  . iron polysaccharides (NIFEREX) 150 MG capsule Take 150 mg by mouth daily.    Marland Kitchen losartan (COZAAR) 50 MG tablet TAKE ONE TABLET BY MOUTH ONCE DAILY 90 tablet 3  . lovastatin (MEVACOR) 40 MG tablet TAKE ONE TABLET BY MOUTH ONCE DAILY AT BEDTIME 90 tablet 0  . mirabegron ER (MYRBETRIQ) 50 MG TB24 tablet Take 50 mg by mouth daily.    . Multiple Vitamins-Minerals (MULTIVITAMINS THER. W/MINERALS) TABS tablet Take 1 tablet by mouth daily.    . [DISCONTINUED] amLODipine (NORVASC) 10 MG tablet Take 1 tablet (10 mg total) by mouth daily. 90 tablet 1  . [DISCONTINUED] potassium chloride SA (KLOR-CON M20) 20 MEQ tablet Take 1 tablet (20 mEq total) by mouth daily. 60 tablet 1   No  current facility-administered medications for this visit.    Allergies:   Codeine    Social History:  The patient  reports that she has never smoked. She has never used smokeless tobacco. She reports that she does not drink alcohol or use illicit drugs.   Family History:  The patient's family history includes Cancer in her sister; Cancer (age of onset: 66) in her father; Heart attack (age of onset: 79) in her mother; Hypertension in her brother and mother; Kidney failure in her mother; Prostate cancer in her father; Throat cancer in her father. There is no history of Colon cancer or Colon polyps.    ROS:  Please see the history of present illness.    Otherwise, review of systems positive for none.   All other systems are reviewed and negative.  PHYSICAL EXAM: VS:  BP 112/74 mmHg  Pulse 61  Ht 5' 1.5" (1.562 m)  Wt 184 lb 2 oz (83.519 kg)  BMI 34.23 kg/m2 , BMI Body mass index is 34.23 kg/(m^2).  General: Alert, oriented x3, no distress Head: no evidence of trauma, PERRL, EOMI, no exophtalmos or lid lag, no myxedema, no xanthelasma; normal ears, nose and oropharynx Neck: normal jugular venous pulsations and no hepatojugular reflux; brisk carotid pulses without delay and no carotid bruits Chest: clear to auscultation, no signs of consolidation by percussion or palpation, normal fremitus, symmetrical and full respiratory excursions Cardiovascular: normal position and quality of the apical impulse, regular rhythm, normal first and second heart sounds, no murmurs, rubs or gallops Abdomen: no tenderness or distention, no masses by palpation, no abnormal pulsatility or arterial bruits, normal bowel sounds, no hepatosplenomegaly Extremities: no clubbing, cyanosis or edema; 2+ radial, ulnar and brachial pulses bilaterally; 2+ right femoral, posterior tibial and dorsalis pedis pulses; 2+ left femoral, posterior tibial and dorsalis pedis pulses; no subclavian or femoral bruits Neurological:  grossly nonfocal Psych: euthymic mood, full affect   EKG:  EKG is ordered today. The ekg ordered today demonstrates NSR   Recent Labs: 02/19/2015: ALT 13; BUN 17; Creat 1.02; Hemoglobin 10.4*; Platelets 304; Potassium 3.7; Sodium 141; TSH 2.582    Lipid Panel    Component Value Date/Time   CHOL 144 02/19/2015 1202   TRIG 49 02/19/2015 1202   HDL 82 02/19/2015 1202   CHOLHDL 1.8 02/19/2015 1202   VLDL 10 02/19/2015 1202   LDLCALC 52 02/19/2015 1202      Wt Readings from Last 3 Encounters:  08/02/15 185 lb 12.8 oz (84.278 kg)  08/01/15 184 lb 2 oz (83.519 kg)  02/15/15 193 lb 0.6 oz (87.562 kg)    ASSESSMENT AND PLAN:  1.  Asymptomatic moderate aneurysm of the ascending thoracic aorta. Repeat CT angiography.  Discussed the measurements that we are following and the thresholds at which surgery would be contemplated. Continue aggressive maintenance of normal blood pressure with the use of beta blockers and agi tensin receptor blockers as preferred agents. The beta blocker dose cannot be increased due to bradycardia.  2.  Essential hypertension with excellent control  3.  Hyperlipidemia with excellent lipid profile on current statin therapy  4.  Obesity , encouraged to start efforts to lose weight again. She is limited by her spine problems. Gabapentin offer some relief for the neuropathic symptoms, but also causes some sedation and dizziness.  Avoid intense isometric exercise but encouraged regular , moderate, isotonic exercise  5.  Cardiac murmur due to intermittent left ventricular outflow dynamic obstruction.  Murmur is not evident on exam today and there was no gradient on her last echo. Suspect the previous gradient on echocardiography may have been related to hyperadrenergic state or dehydration. She does not have ECG or echo features to suggest true hypertrophic obstructive cardiomyopathy    Current medicines are reviewed at length with the patient today.  The patient  does not have concerns regarding medicines.  The following changes have been made:  no change  Labs/ tests ordered today include:  Orders Placed This Encounter  Procedures  . CT Angio Chest PE W/Cm &/Or Wo Cm  . EKG 12-Lead   Patient Instructions  Non-Cardiac CT Angiography (CTA), is a special type of CT scan that uses a computer to produce multi-dimensional views of major blood vessels throughout the body IN ONE YEAR. In CT angiography, a contrast material is  injected through an IV to help visualize the blood vessels.  Dr. [name] recommends that you schedule a follow-up appointment in: ONE YEAR FOLLOWING THE CT Eusebio Me, MD  08/02/2015 4:51 PM    [name], MD, [facility] [phone] office [phone] pager

## 2015-08-02 ENCOUNTER — Encounter: Payer: Self-pay | Admitting: Family Medicine

## 2015-08-02 ENCOUNTER — Ambulatory Visit (INDEPENDENT_AMBULATORY_CARE_PROVIDER_SITE_OTHER): Payer: Commercial Managed Care - HMO | Admitting: Family Medicine

## 2015-08-02 VITALS — BP 122/76 | HR 55 | Resp 16 | Ht 61.5 in | Wt 185.8 lb

## 2015-08-02 DIAGNOSIS — Z0001 Encounter for general adult medical examination with abnormal findings: Secondary | ICD-10-CM | POA: Insufficient documentation

## 2015-08-02 DIAGNOSIS — E785 Hyperlipidemia, unspecified: Secondary | ICD-10-CM

## 2015-08-02 DIAGNOSIS — Z1159 Encounter for screening for other viral diseases: Secondary | ICD-10-CM

## 2015-08-02 DIAGNOSIS — Z0000 Encounter for general adult medical examination without abnormal findings: Secondary | ICD-10-CM | POA: Insufficient documentation

## 2015-08-02 DIAGNOSIS — R7302 Impaired glucose tolerance (oral): Secondary | ICD-10-CM

## 2015-08-02 NOTE — Patient Instructions (Signed)
Annual physical June 24 or after , call if you need me sooner   Fasting lipid, cmp and hep C screen  In December  Pls increase physical activity for health to 6 days per week  Pls work on living will as discussed  Thanks for choosing [facility], we consider it a privelige to serve you. Marland Kitchen

## 2015-08-02 NOTE — Progress Notes (Signed)
Subjective:    Patient ID: Desiree [name], female    DOB: 1946/06/06, 69 y.o.   MRN: [id number]  HPI Preventive Screening-Counseling & Management   Patient present here today for a Medicare annual wellness visit.   Current Problems (verified)   Medications Prior to Visit Allergies (verified)   PAST HISTORY  Family History (verified)   Social History Divorced since 1983 four children, Semi retired, works part time at daycare. Never smoker    Risk Factors  Current exercise habits:  Goes to the [facility]- goes walking some days at the park   Dietary issues discussed: Eats diet rich in vegetables and limits fried foods    Cardiac risk factors: Mother had MI, htn   Depression Screen  (Note: if answer to either of the following is "Yes", a more complete depression screening is indicated)   Over the past two weeks, have you felt down, depressed or hopeless? No  Over the past two weeks, have you felt little interest or pleasure in doing things? No  Have you lost interest or pleasure in daily life? No  Do you often feel hopeless? No  Do you cry easily over simple problems? No   Activities of Daily Living  In your present state of health, do you have any difficulty performing the following activities?  Driving?: No Managing money?: No Feeding yourself?:No Getting from bed to chair?:No Climbing a flight of stairs?:No Preparing food and eating?:No Bathing or showering?:No Getting dressed?:No Getting to the toilet?:No Using the toilet?:No Moving around from place to place?: No  Fall Risk Assessment In the past year have you fallen or had a near fall?:No Are you currently taking any medications that make you dizzy?:No   Hearing Difficulties: No Do you often ask people to speak up or repeat themselves?:No Do you experience ringing or noises in your ears?:No Do you have difficulty understanding soft or whispered voices?:No  Cognitive Testing  Alert? Yes Normal  Appearance?Yes  Oriented to person? Yes Place? Yes  Time? Yes  Displays appropriate judgment?Yes  Can read the correct time from a watch face? yes Are you having problems remembering things?No  Advanced Directives have been discussed with the patient?Yes, brochure given , full code   List the Names of Other Physician/Practitioners you currently use:  Dr Servando Snare (surgeon) Dr [name] (cardio)  Dr Andree Elk (dentist)  Dr Radford Pax (opth)   Indicate any recent Medical Services you may have received from other than [facility] providers in the past year (date may be approximate).   Assessment:    Annual Wellness Exam   Plan:     Medicare Attestation  I have personally reviewed:  The patient's medical and social history  Their use of alcohol, tobacco or illicit drugs  Their current medications and supplements  The patient's functional ability including ADLs,fall risks, home safety risks, cognitive, and hearing and visual impairment  Diet and physical activities  Evidence for depression or mood disorders  The patient's weight, height, BMI, and visual acuity have been recorded in the chart. I have made referrals, counseling, and provided education to the patient based on review of the above and I have provided the patient with a written personalized care plan for preventive services.      Review of Systems     Objective:   Physical Exam  BP 122/76 mmHg  Pulse 55  Resp 16  Ht 5' 1.5" (1.562 m)  Wt 185 lb 12.8 oz (84.278 kg)  BMI 34.54 kg/m2  SpO2  97%       Assessment & Plan:  Medicare annual wellness visit, subsequent Annual exam as documented. Counseling done  re healthy lifestyle involving commitment to 150 minutes exercise per week, heart healthy diet, and attaining healthy weight.The importance of adequate sleep also discussed. Regular seat belt use and home safety, is also discussed. Changes in health habits are decided on by the patient with goals and time frames  set for  achieving them. Immunization and cancer screening needs are specifically addressed at this visit.

## 2015-08-02 NOTE — Assessment & Plan Note (Signed)

## 2015-08-05 ENCOUNTER — Encounter: Payer: Self-pay | Admitting: Family Medicine

## 2015-08-06 ENCOUNTER — Ambulatory Visit: Payer: Self-pay | Admitting: Cardiovascular Disease

## 2015-08-11 LAB — LIPID PANEL
CHOLESTEROL: 170 mg/dL (ref 125–200)
HDL: 95 mg/dL (ref >=46)
LDL CALC: 66 mg/dL (ref <130)
TRIGLYCERIDES: 43 mg/dL (ref <150)
Total CHOL/HDL Ratio: 1.8 Ratio (ref <=5.0)
VLDL: 9 mg/dL (ref <30)

## 2015-08-11 LAB — COMPREHENSIVE METABOLIC PANEL
ALT: 14 U/L (ref 6–29)
AST: 36 U/L — ABNORMAL HIGH (ref 10–35)
Albumin: 3.9 g/dL (ref 3.6–5.1)
Alkaline Phosphatase: 74 U/L (ref 33–130)
BUN: 18 mg/dL (ref 7–25)
CHLORIDE: 100 mmol/L (ref 98–110)
CO2: 29 mmol/L (ref 20–31)
Calcium: 9.7 mg/dL (ref 8.6–10.4)
Creat: 1.11 mg/dL — ABNORMAL HIGH (ref 0.50–0.99)
GLUCOSE: 91 mg/dL (ref 65–99)
POTASSIUM: 4.2 mmol/L (ref 3.5–5.3)
SODIUM: 140 mmol/L (ref 135–146)
Total Bilirubin: 0.8 mg/dL (ref 0.2–1.2)
Total Protein: 7.1 g/dL (ref 6.1–8.1)

## 2015-08-11 LAB — HEPATITIS C ANTIBODY: HCV AB: NEGATIVE

## 2015-08-29 ENCOUNTER — Other Ambulatory Visit: Payer: Self-pay | Admitting: Family Medicine

## 2015-08-31 ENCOUNTER — Other Ambulatory Visit: Payer: Self-pay | Admitting: Cardiovascular Disease

## 2015-08-31 NOTE — Telephone Encounter (Signed)
Rx(s) sent to pharmacy electronically.  

## 2015-08-31 NOTE — Telephone Encounter (Signed)
°*  STAT* If patient is at the pharmacy, call can be transferred to refill team.   1. Which medications need to be refilled? (please list name of each medication and dose if known) Carvedilol-please call today,if possible  2. Which pharmacy/location (including street and city if local pharmacy) is medication to be sent to?[phone]  3. Do they need a 30 day or 90 day supply? 180 and refills

## 2015-08-31 NOTE — Telephone Encounter (Signed)
°*  STAT* If patient is at the pharmacy, call can be transferred to refill team.   1. Which medications need to be refilled? (please list name of each medication and dose if known) Carvedilol 3.125mg   2. Which pharmacy/location (including street and city if local pharmacy) is medication to be sent to? [facility]   3. Do they need a 30 day or 90 day supply? [place]

## 2015-09-21 ENCOUNTER — Other Ambulatory Visit: Payer: Self-pay | Admitting: Family Medicine

## 2015-10-06 NOTE — Progress Notes (Signed)
REVIEWED-NO ADDITIONAL RECOMMENDATIONS. 

## 2015-10-10 ENCOUNTER — Other Ambulatory Visit: Payer: Self-pay | Admitting: *Deleted

## 2015-10-10 ENCOUNTER — Other Ambulatory Visit: Payer: Self-pay | Admitting: Cardiovascular Disease

## 2015-10-10 NOTE — Telephone Encounter (Signed)
Rx request sent to pharmacy.  

## 2015-12-19 ENCOUNTER — Other Ambulatory Visit: Payer: Self-pay | Admitting: Family Medicine

## 2015-12-19 DIAGNOSIS — Z1231 Encounter for screening mammogram for malignant neoplasm of breast: Secondary | ICD-10-CM

## 2015-12-25 ENCOUNTER — Other Ambulatory Visit: Payer: Self-pay | Admitting: Family Medicine

## 2016-01-01 ENCOUNTER — Encounter: Payer: Self-pay | Admitting: Family Medicine

## 2016-01-01 ENCOUNTER — Ambulatory Visit (INDEPENDENT_AMBULATORY_CARE_PROVIDER_SITE_OTHER): Payer: PPO | Admitting: Family Medicine

## 2016-01-01 VITALS — BP 110/70 | HR 60 | Resp 18 | Ht 61.5 in | Wt 180.0 lb

## 2016-01-01 DIAGNOSIS — I10 Essential (primary) hypertension: Secondary | ICD-10-CM | POA: Diagnosis not present

## 2016-01-01 DIAGNOSIS — E785 Hyperlipidemia, unspecified: Secondary | ICD-10-CM | POA: Diagnosis not present

## 2016-01-01 DIAGNOSIS — Z1159 Encounter for screening for other viral diseases: Secondary | ICD-10-CM

## 2016-01-01 DIAGNOSIS — M542 Cervicalgia: Secondary | ICD-10-CM | POA: Diagnosis not present

## 2016-01-01 MED ORDER — METHYLPREDNISOLONE ACETATE 80 MG/ML IJ SUSP
80.0000 mg | Freq: Once | INTRAMUSCULAR | Status: AC
  Administered 2016-01-01: 80 mg via INTRAMUSCULAR

## 2016-01-01 MED ORDER — PREDNISONE 5 MG (21) PO TBPK: 5.0000 mg | ORAL_TABLET | ORAL | Status: DC

## 2016-01-01 MED ORDER — RANITIDINE HCL 300 MG PO TABS: 300.0000 mg | ORAL_TABLET | Freq: Every day | ORAL | Status: DC

## 2016-01-01 MED ORDER — KETOROLAC TROMETHAMINE 60 MG/2ML IM SOLN
60.0000 mg | Freq: Once | INTRAMUSCULAR | Status: AC
  Administered 2016-01-01: 60 mg via INTRAMUSCULAR

## 2016-01-01 MED ORDER — IBUPROFEN 800 MG PO TABS: 800.0000 mg | ORAL_TABLET | Freq: Three times a day (TID) | ORAL | Status: DC | PRN

## 2016-01-01 NOTE — Progress Notes (Signed)
   Subjective:    Patient ID: Desiree [name], female    DOB: 07-11-1946, 70 y.o.   MRN: [id number]  HPI 3 week h/o increased and uncontrolled left neck pain also associated with intermittent light headedness in past 3 to 5 days, states light headedness is improving Otherwise doing well  Review of Systems See HPI Denies recent fever or chills. Denies sinus pressure, nasal congestion, ear pain or sore throat. Denies chest congestion, productive cough or wheezing. Denies chest pains, palpitations and leg swelling Denies abdominal pain, nausea, vomiting,diarrhea or constipation.   Denies dysuria, frequency, hesitancy or incontinence.  C/o , numbness,and  Tingling in upper extremities Denies depression, anxiety or insomnia. Denies skin break down or rash.        Objective:   Physical Exam  BP 110/70 mmHg  Pulse 60  Resp 18  Ht 5' 1.5" (1.562 m)  Wt 180 lb (81.647 kg)  BMI 33.46 kg/m2  SpO2 100% Pt in pain Patient alert and oriented and in no cardiopulmonary distress.  HEENT: No facial asymmetry, EOMI,   oropharynx pink and moist.  Neck decreased ROM no JVD, no mass.  Chest: Clear to auscultation bilaterally.  CVS: S1, S2 no murmurs, no S3.Regular rate.  ABD: Soft non tender.   Ext: No edema  MS: Adequate ROM spine, shoulders, hips and knees.  Skin: Intact, no ulcerations or rash noted.  Psych: Good eye contact, normal affect. Memory intact not anxious or depressed appearing.  CNS: CN 2-12 intact, power,  normal throughout.no focal deficits noted.       Assessment & Plan:  Neck pain on left side Uncontrolled.Toradol and depo medrol administered IM in the office , to be followed by a short course of oral prednisone and NSAIDS.   Essential hypertension Controlled, no change in medication DASH diet and commitment to daily physical activity for a minimum of 30 minutes discussed and encouraged, as a part of hypertension management. The importance of attaining a  healthy weight is also discussed.  BP/Weight 01/01/2016 08/02/2015 08/01/2015 02/15/2015 11/07/2014 09/21/2014 [id number]  Systolic BP A999333 [id number] A999333 0000000 A999333 [id number]  Diastolic BP 70 76 74 68 84 76 80  Wt. (Lbs) 180 185.8 184.13 193.04 195.12 191 190.6  BMI 33.46 34.54 34.23 35.89 36.28 35.51 35.44            Hyperlipidemia LDL goal <100 Hyperlipidemia:Low fat diet discussed and encouraged.   Lipid Panel  Lab Results  Component Value Date   CHOL 170 08/11/2015   HDL 95 08/11/2015   LDLCALC 66 08/11/2015   TRIG 43 08/11/2015   CHOLHDL 1.8 08/11/2015   Updated lab needed at/ before next visit. Controlled, no change in medication

## 2016-01-01 NOTE — Assessment & Plan Note (Signed)
Uncontrolled.Toradol and depo medrol administered IM in the office , to be followed by a short course of oral prednisone and NSAIDS.  

## 2016-01-01 NOTE — Patient Instructions (Signed)
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

## 2016-01-05 ENCOUNTER — Encounter: Payer: Self-pay | Admitting: Family Medicine

## 2016-01-05 NOTE — Assessment & Plan Note (Addendum)
Controlled, no change in medication DASH diet and commitment to daily physical activity for a minimum of 30 minutes discussed and encouraged, as a part of hypertension management. The importance of attaining a healthy weight is also discussed.  BP/Weight 01/01/2016 08/02/2015 08/01/2015 02/15/2015 11/07/2014 09/21/2014 [id number]  Systolic BP A999333 [id number] A999333 0000000 A999333 A999333  Diastolic BP 70 76 74 68 84 76 80  Wt. (Lbs) 180 185.8 184.13 193.04 195.12 191 190.6  BMI 33.46 34.54 34.23 35.89 36.28 35.51 35.44

## 2016-01-05 NOTE — Assessment & Plan Note (Signed)
Hyperlipidemia:Low fat diet discussed and encouraged.   Lipid Panel  Lab Results  Component Value Date   CHOL 170 08/11/2015   HDL 95 08/11/2015   LDLCALC 66 08/11/2015   TRIG 43 08/11/2015   CHOLHDL 1.8 08/11/2015   Updated lab needed at/ before next visit. Controlled, no change in medication

## 2016-01-09 ENCOUNTER — Other Ambulatory Visit: Payer: Self-pay | Admitting: Family Medicine

## 2016-01-14 ENCOUNTER — Ambulatory Visit (HOSPITAL_COMMUNITY)
Admission: RE | Admit: 2016-01-14 | Discharge: 2016-01-14 | Disposition: A | Discharge status: Home / Self Care | Payer: PPO | Source: Ambulatory Visit | Attending: Family Medicine | Admitting: Family Medicine

## 2016-01-14 DIAGNOSIS — Z1231 Encounter for screening mammogram for malignant neoplasm of breast: Secondary | ICD-10-CM | POA: Diagnosis not present

## 2016-01-16 DIAGNOSIS — Z79899 Other long term (current) drug therapy: Secondary | ICD-10-CM | POA: Diagnosis not present

## 2016-01-16 DIAGNOSIS — Z1159 Encounter for screening for other viral diseases: Secondary | ICD-10-CM | POA: Diagnosis not present

## 2016-01-16 DIAGNOSIS — N183 Chronic kidney disease, stage 3 (moderate): Secondary | ICD-10-CM | POA: Diagnosis not present

## 2016-01-16 DIAGNOSIS — R809 Proteinuria, unspecified: Secondary | ICD-10-CM | POA: Diagnosis not present

## 2016-01-16 DIAGNOSIS — D509 Iron deficiency anemia, unspecified: Secondary | ICD-10-CM | POA: Diagnosis not present

## 2016-01-16 DIAGNOSIS — I10 Essential (primary) hypertension: Secondary | ICD-10-CM | POA: Diagnosis not present

## 2016-01-16 DIAGNOSIS — E785 Hyperlipidemia, unspecified: Secondary | ICD-10-CM | POA: Diagnosis not present

## 2016-01-16 DIAGNOSIS — E559 Vitamin D deficiency, unspecified: Secondary | ICD-10-CM | POA: Diagnosis not present

## 2016-01-17 LAB — COMPREHENSIVE METABOLIC PANEL
ALT: 13 U/L (ref 6–29)
AST: 21 U/L (ref 10–35)
Albumin: 3.7 g/dL (ref 3.6–5.1)
Alkaline Phosphatase: 64 U/L (ref 33–130)
BILIRUBIN TOTAL: 0.5 mg/dL (ref 0.2–1.2)
BUN: 20 mg/dL (ref 7–25)
CO2: 28 mmol/L (ref 20–31)
CREATININE: 1.04 mg/dL — AB (ref 0.50–0.99)
Calcium: 9.2 mg/dL (ref 8.6–10.4)
Chloride: 101 mmol/L (ref 98–110)
GLUCOSE: 89 mg/dL (ref 65–99)
Potassium: 3.7 mmol/L (ref 3.5–5.3)
SODIUM: 139 mmol/L (ref 135–146)
Total Protein: 6.5 g/dL (ref 6.1–8.1)

## 2016-01-17 LAB — LIPID PANEL
CHOLESTEROL: 192 mg/dL (ref 125–200)
HDL: 108 mg/dL (ref >=46)
LDL CALC: 74 mg/dL (ref <130)
Total CHOL/HDL Ratio: 1.8 Ratio (ref <=5.0)
Triglycerides: 50 mg/dL (ref <150)
VLDL: 10 mg/dL (ref <30)

## 2016-01-17 LAB — HEPATITIS C ANTIBODY: HCV AB: NEGATIVE

## 2016-01-23 DIAGNOSIS — E669 Obesity, unspecified: Secondary | ICD-10-CM | POA: Diagnosis not present

## 2016-01-23 DIAGNOSIS — N182 Chronic kidney disease, stage 2 (mild): Secondary | ICD-10-CM | POA: Diagnosis not present

## 2016-01-23 DIAGNOSIS — D649 Anemia, unspecified: Secondary | ICD-10-CM | POA: Diagnosis not present

## 2016-01-23 DIAGNOSIS — E559 Vitamin D deficiency, unspecified: Secondary | ICD-10-CM | POA: Diagnosis not present

## 2016-02-18 ENCOUNTER — Ambulatory Visit (INDEPENDENT_AMBULATORY_CARE_PROVIDER_SITE_OTHER): Payer: PPO | Admitting: Family Medicine

## 2016-02-18 ENCOUNTER — Encounter: Payer: Self-pay | Admitting: Family Medicine

## 2016-02-18 VITALS — BP 114/68 | HR 72 | Resp 18 | Ht 61.5 in | Wt 181.1 lb

## 2016-02-18 DIAGNOSIS — L309 Dermatitis, unspecified: Secondary | ICD-10-CM

## 2016-02-18 DIAGNOSIS — R5382 Chronic fatigue, unspecified: Secondary | ICD-10-CM

## 2016-02-18 DIAGNOSIS — E785 Hyperlipidemia, unspecified: Secondary | ICD-10-CM | POA: Diagnosis not present

## 2016-02-18 DIAGNOSIS — Z1211 Encounter for screening for malignant neoplasm of colon: Secondary | ICD-10-CM

## 2016-02-18 DIAGNOSIS — I10 Essential (primary) hypertension: Secondary | ICD-10-CM | POA: Diagnosis not present

## 2016-02-18 DIAGNOSIS — Z0000 Encounter for general adult medical examination without abnormal findings: Secondary | ICD-10-CM | POA: Diagnosis not present

## 2016-02-18 LAB — HEMOCCULT GUIAC POC 1CARD (OFFICE): FECAL OCCULT BLD: NEGATIVE

## 2016-02-18 MED ORDER — BETAMETHASONE DIPROPIONATE 0.05 % EX CREA: TOPICAL_CREAM | Freq: Two times a day (BID) | CUTANEOUS | Status: DC

## 2016-02-18 MED ORDER — GABAPENTIN 100 MG PO CAPS: ORAL_CAPSULE | ORAL | Status: DC

## 2016-02-18 NOTE — Assessment & Plan Note (Signed)
Hyperlipidemia:Low fat diet discussed and encouraged.   Lipid Panel  Lab Results  Component Value Date   CHOL 192 01/16/2016   HDL 108 01/16/2016   LDLCALC 74 01/16/2016   TRIG 50 01/16/2016   CHOLHDL 1.8 01/16/2016   Controlled, no change in medication

## 2016-02-18 NOTE — Progress Notes (Signed)
Subjective:    Patient ID: Desiree [name], female    DOB: 1946-01-17, 70 y.o.   MRN: [id number]  HPI Patient is in for annual physical exam. Recent labs, if available are reviewed. C/o fatigue, and has chronic insomnia, sleeps generally 4 to 5 hrs for years, need to f/u nephrology labs to see if CBC and TSH updated Immunization is reviewed  And is up to date. C/o itchy rash on left side of neck, intermittent, primarily in  Warm months, h/o eczema Review of Systems See HPI     Objective:   Physical Exam BP 114/68 mmHg  Pulse 72  Resp 18  Ht 5' 1.5" (1.562 m)  Wt 181 lb 1.3 oz (82.137 kg)  BMI 33.66 kg/m2  SpO2 98% Pleasant well nourished female, alert and oriented x 3, in no cardio-pulmonary distress. Afebrile. HEENT No facial trauma or asymetry. Sinuses non tender.  Extra occullar muscles intact, pupils equally reactive to light. External ears normal, tympanic membranes clear. Oropharynx moist, no exudate,fairly good entition. Neck: supple, no adenopathy,JVD or thyromegaly.No bruits.  Chest: Clear to ascultation bilaterally.No crackles or wheezes. Non tender to palpation  Breast: No asymetry,no masses or lumps. No tenderness. No nipple discharge or inversion. No axillary or supraclavicular adenopathy  Cardiovascular system; Heart sounds normal,  S1 and  S2 ,no S3.  No murmur, or thrill. Apical beat not displaced Peripheral pulses normal.  Abdomen: Soft, non tender, no organomegaly or masses. No bruits. Bowel sounds normal. No guarding, tenderness or rebound.  Rectal:  Normal sphincter tone. No mass.No rectal masses.  Guaiac negative stool.  GU: External genitalia normal female genitalia , female distribution of hair. No lesions. Urethral meatus normal in size, no  Prolapse, no lesions visibly  Present. Bladder non tender. Vagina pink and moist , with no visible lesions , discharge present . Adequate pelvic support no  cystocele or rectocele  noted Cervix absent  Uterus absent, no adnexal masses, no  adnexal tenderness.   Musculoskeletal exam: Full ROM of spine, hips , shoulders and knees. No deformity ,swelling or crepitus noted. No muscle wasting or atrophy.   Neurologic: Cranial nerves 2 to 12 intact. Power, tone ,sensation and reflexes normal throughout. No disturbance in gait. No tremor.  Skin: Intact, macular rash on left neck, mildly erythematous, no raised border, no skin breakdown or purulent drainage Pigmentation normal throughout  Psych; Normal mood and affect. Judgement and concentration normal       Assessment & Plan:   Annual physical exam Annual exam as documented. Counseling done  re healthy lifestyle involving commitment to 150 minutes exercise per week, heart healthy diet, and attaining healthy weight.The importance of adequate sleep also discussed. Regular seat belt use and home safety, is also discussed. Changes in health habits are decided on by the patient with goals and time frames  set for achieving them. Immunization and cancer screening needs are specifically addressed at this visit.   Dermatitis Erythematous pruritic rash to left neck intermittently, aggravated by heat and stress  Hyperlipidemia LDL goal <100 Hyperlipidemia:Low fat diet discussed and encouraged.   Lipid Panel  Lab Results  Component Value Date   CHOL 192 01/16/2016   HDL 108 01/16/2016   LDLCALC 74 01/16/2016   TRIG 50 01/16/2016   CHOLHDL 1.8 01/16/2016   Controlled, no change in medication     Impaired fasting glucose Patient educated about the importance of limiting  Carbohydrate intake , the need to commit to daily physical activity for a  minimum of 30 minutes , and to commit weight loss. The fact that changes in all these areas will reduce or eliminate all together the development of diabetes is stressed.  Updated lab needed at/ before next visit.   Diabetic Labs Latest Ref Rng 01/16/2016  08/11/2015 02/19/2015 09/21/2014 05/27/2014  HbA1c <5.7 % - - - - 5.6  Chol 125 - 200 mg/dL 192 170 144 - 155  HDL >=46 mg/dL 108 95 82 - 76  Calc LDL <130 mg/dL 74 66 52 - 71  Triglycerides <150 mg/dL 50 43 49 - 41  Creatinine 0.50 - 0.99 mg/dL 1.04(H) 1.11(H) 1.02 1.03 1.04   BP/Weight 02/18/2016 01/01/2016 08/02/2015 08/01/2015 02/15/2015 11/07/2014 [id number]  Systolic BP 99991111 A999333 [id number] A999333 0000000 A999333  Diastolic BP 68 70 76 74 68 84 76  Wt. (Lbs) 181.08 180 185.8 184.13 193.04 195.12 191  BMI 33.66 33.46 34.54 34.23 35.89 36.28 35.51   No flowsheet data found.

## 2016-02-18 NOTE — Assessment & Plan Note (Signed)
Patient educated about the importance of limiting  Carbohydrate intake , the need to commit to daily physical activity for a minimum of 30 minutes , and to commit weight loss. The fact that changes in all these areas will reduce or eliminate all together the development of diabetes is stressed.  Updated lab needed at/ before next visit.   Diabetic Labs Latest Ref Rng 01/16/2016 08/11/2015 02/19/2015 09/21/2014 05/27/2014  HbA1c <5.7 % - - - - 5.6  Chol 125 - 200 mg/dL 192 170 144 - 155  HDL >=46 mg/dL 108 95 82 - 76  Calc LDL <130 mg/dL 74 66 52 - 71  Triglycerides <150 mg/dL 50 43 49 - 41  Creatinine 0.50 - 0.99 mg/dL 1.04(H) 1.11(H) 1.02 1.03 1.04   BP/Weight 02/18/2016 01/01/2016 08/02/2015 08/01/2015 02/15/2015 11/07/2014 [id number]  Systolic BP 99991111 A999333 [id number] A999333 0000000 A999333  Diastolic BP 68 70 76 74 68 84 76  Wt. (Lbs) 181.08 180 185.8 184.13 193.04 195.12 191  BMI 33.66 33.46 34.54 34.23 35.89 36.28 35.51   No flowsheet data found.

## 2016-02-18 NOTE — Assessment & Plan Note (Signed)

## 2016-02-18 NOTE — Patient Instructions (Signed)
F/u in 4.5 month, call if you need me sooner  Cream sent for use on rash to your pharmacy\ It is important that you exercise regularly at least 30 minutes 5 times a week. If you develop chest pain, have severe difficulty breathing, or feel very tired, stop exercising immediately and seek medical attention `   Pls start tylenol PM one at bedtime to improve sleep and help fatigue, if persists , call, I will refer you to Dr Merlene Laughter for sleep evaluation   After I see labs from Dr Birdie Sons we will contact you if you need additional labs  Thank you  for choosing [facility]. We consider it a privelige to serve you.  Delivering excellent health care in a caring and  compassionate way is our goal.  Partnering with you,  so that together we can achieve this goal is our strategy.

## 2016-02-18 NOTE — Assessment & Plan Note (Signed)
Erythematous pruritic rash to left neck intermittently, aggravated by heat and stress

## 2016-02-19 ENCOUNTER — Telehealth: Payer: Self-pay | Admitting: Family Medicine

## 2016-02-19 DIAGNOSIS — I10 Essential (primary) hypertension: Secondary | ICD-10-CM

## 2016-02-19 DIAGNOSIS — E785 Hyperlipidemia, unspecified: Secondary | ICD-10-CM

## 2016-02-19 NOTE — Telephone Encounter (Signed)
Pls let her know I reviewed recent labs form kidney Specialist who has already addressed her blood count  Labs needed for next visit in Nov are fasting lipid, cmp and EGFr, TSH, pls order   Thanks

## 2016-02-20 NOTE — Addendum Note (Signed)
Addended by: Denman George B on: 02/20/2016 09:14 AM   Modules accepted: Orders

## 2016-02-20 NOTE — Telephone Encounter (Signed)
Labs ordered.  Message left for patient to call office.

## 2016-02-20 NOTE — Telephone Encounter (Signed)
Patient aware.

## 2016-03-10 DIAGNOSIS — N3946 Mixed incontinence: Secondary | ICD-10-CM | POA: Diagnosis not present

## 2016-03-24 ENCOUNTER — Telehealth: Payer: Self-pay | Admitting: Family Medicine

## 2016-03-24 ENCOUNTER — Other Ambulatory Visit: Payer: Self-pay

## 2016-03-24 ENCOUNTER — Other Ambulatory Visit: Payer: Self-pay | Admitting: Family Medicine

## 2016-03-24 DIAGNOSIS — M542 Cervicalgia: Secondary | ICD-10-CM

## 2016-03-24 MED ORDER — IBUPROFEN 800 MG PO TABS: 800.0000 mg | ORAL_TABLET | Freq: Three times a day (TID) | ORAL | 0 refills | Status: DC | PRN

## 2016-03-24 NOTE — Telephone Encounter (Signed)
Desiree [name] is asking for a refill on Ibuprofen for her back pain, please advise?

## 2016-03-24 NOTE — Telephone Encounter (Signed)
Med sent in.

## 2016-04-07 DIAGNOSIS — H10811 Pingueculitis, right eye: Secondary | ICD-10-CM | POA: Diagnosis not present

## 2016-04-07 DIAGNOSIS — H35371 Puckering of macula, right eye: Secondary | ICD-10-CM | POA: Diagnosis not present

## 2016-04-07 DIAGNOSIS — H35413 Lattice degeneration of retina, bilateral: Secondary | ICD-10-CM | POA: Diagnosis not present

## 2016-04-07 DIAGNOSIS — H2513 Age-related nuclear cataract, bilateral: Secondary | ICD-10-CM | POA: Diagnosis not present

## 2016-04-07 DIAGNOSIS — H35033 Hypertensive retinopathy, bilateral: Secondary | ICD-10-CM | POA: Diagnosis not present

## 2016-04-07 DIAGNOSIS — H25013 Cortical age-related cataract, bilateral: Secondary | ICD-10-CM | POA: Diagnosis not present

## 2016-04-08 ENCOUNTER — Other Ambulatory Visit: Payer: Self-pay | Admitting: Family Medicine

## 2016-05-14 ENCOUNTER — Ambulatory Visit (INDEPENDENT_AMBULATORY_CARE_PROVIDER_SITE_OTHER): Payer: PPO | Admitting: Family Medicine

## 2016-05-14 ENCOUNTER — Encounter: Payer: Self-pay | Admitting: Family Medicine

## 2016-05-14 VITALS — BP 118/80 | HR 87 | Temp 99.1°F | Resp 16 | Ht 61.5 in | Wt 184.0 lb

## 2016-05-14 DIAGNOSIS — I10 Essential (primary) hypertension: Secondary | ICD-10-CM | POA: Diagnosis not present

## 2016-05-14 DIAGNOSIS — R05 Cough: Secondary | ICD-10-CM | POA: Diagnosis not present

## 2016-05-14 DIAGNOSIS — J3089 Other allergic rhinitis: Secondary | ICD-10-CM

## 2016-05-14 DIAGNOSIS — R058 Other specified cough: Secondary | ICD-10-CM | POA: Insufficient documentation

## 2016-05-14 MED ORDER — PREDNISONE 5 MG PO TABS: 5.0000 mg | ORAL_TABLET | Freq: Two times a day (BID) | ORAL | 0 refills | Status: AC

## 2016-05-14 MED ORDER — MONTELUKAST SODIUM 10 MG PO TABS: 10.0000 mg | ORAL_TABLET | Freq: Every day | ORAL | 0 refills | Status: DC

## 2016-05-14 NOTE — Patient Instructions (Signed)
F/u as before  You are treated for allergic cough and sinus symptoms. Prednisone and Singulair are prescribed.  Hope you  Feel better soon  Thank you  for choosing [facility]. We consider it a privelige to serve you.  Delivering excellent health care in a caring and  compassionate way is our goal.  Partnering with you,  so that together we can achieve this goal is our strategy.

## 2016-05-14 NOTE — Assessment & Plan Note (Addendum)
Uncontrolled x 1 week, short course of prednisone and commit to daily medication, singulair prescribed

## 2016-05-16 NOTE — Progress Notes (Signed)
   Desiree [name]     MRN: [id number]      DOB: 06-07-1946   HPI Desiree [name] c/o 1 week h/o increased nasal drainage which is clear with cough and runny nose, Also excess sneezing, symptoms are less severe in past 2 days but persist. Denies fever, chills, discolored drainage or sputum   ROS . Denies chest pains, palpitations and leg swelling Denies abdominal pain, nausea, vomiting,diarrhea or constipation.   Denies dysuria, frequency, hesitancy or incontinence. Denies joint pain, swelling and limitation in mobility. Denies headaches, seizures, numbness, or tingling. Denies depression, anxiety or insomnia. Denies skin break down or rash.   PE  BP 118/80   Pulse 87   Temp 99.1 F (37.3 C) (Oral)   Resp 16   Ht 5' 1.5" (1.562 m)   Wt 184 lb (83.5 kg)   SpO2 99%   BMI 34.20 kg/m   Patient alert and oriented and in no cardiopulmonary distress.  HEENT: No facial asymmetry, EOMI,   oropharynx pink and moist.  Neck supple no JVD, no mass.Erythema and edema of nasal mucosa, slight conjunctival injection  Chest: Clear to auscultation bilaterally.  CVS: S1, S2 no murmurs, no S3.Regular rate.  ABD: Soft non tender.   Ext: No edema  MS: Adequate ROM spine, shoulders, hips and knees.  Skin: Intact, no ulcerations or rash noted.  Psych: Good eye contact, normal affect. Memory intact not anxious or depressed appearing.  CNS: CN 2-12 intact, power,  normal throughout.no focal deficits noted.   Assessment & Plan  Allergic rhinitis Uncontrolled x 1 week, short course of prednisone and commit to daily medication, singulair prescribed  Allergic cough 1 week flare, short course of prednisone and decongestant, no s/s of infection  Essential hypertension Controlled, no change in medication DASH diet and commitment to daily physical activity for a minimum of 30 minutes discussed and encouraged, as a part of hypertension management. The importance of attaining a healthy weight is  also discussed.  BP/Weight 05/14/2016 02/18/2016 01/01/2016 08/02/2015 08/01/2015 02/15/2015 [id number]  Systolic BP 123456 99991111 A999333 [id number] A999333 0000000  Diastolic BP 80 68 70 76 74 68 84  Wt. (Lbs) 184 181.08 180 185.8 184.13 193.04 195.12  BMI 34.2 33.66 33.46 34.54 34.23 35.89 36.28

## 2016-05-16 NOTE — Assessment & Plan Note (Signed)
Controlled, no change in medication DASH diet and commitment to daily physical activity for a minimum of 30 minutes discussed and encouraged, as a part of hypertension management. The importance of attaining a healthy weight is also discussed.  BP/Weight 05/14/2016 02/18/2016 01/01/2016 08/02/2015 08/01/2015 02/15/2015 [id number]  Systolic BP 123456 99991111 A999333 [id number] A999333 0000000  Diastolic BP 80 68 70 76 74 68 84  Wt. (Lbs) 184 181.08 180 185.8 184.13 193.04 195.12  BMI 34.2 33.66 33.46 34.54 34.23 35.89 36.28

## 2016-05-16 NOTE — Assessment & Plan Note (Signed)
1 week flare, short course of prednisone and decongestant, no s/s of infection

## 2016-06-11 ENCOUNTER — Other Ambulatory Visit: Payer: Self-pay | Admitting: *Deleted

## 2016-06-11 DIAGNOSIS — I712 Thoracic aortic aneurysm, without rupture, unspecified: Secondary | ICD-10-CM

## 2016-06-30 ENCOUNTER — Other Ambulatory Visit: Payer: Self-pay | Admitting: Family Medicine

## 2016-07-02 DIAGNOSIS — I10 Essential (primary) hypertension: Secondary | ICD-10-CM | POA: Diagnosis not present

## 2016-07-02 DIAGNOSIS — E785 Hyperlipidemia, unspecified: Secondary | ICD-10-CM | POA: Diagnosis not present

## 2016-07-03 LAB — COMPLETE METABOLIC PANEL WITH GFR
ALT: 14 U/L (ref 6–29)
AST: 29 U/L (ref 10–35)
Albumin: 3.9 g/dL (ref 3.6–5.1)
Alkaline Phosphatase: 78 U/L (ref 33–130)
BILIRUBIN TOTAL: 0.6 mg/dL (ref 0.2–1.2)
BUN: 20 mg/dL (ref 7–25)
CALCIUM: 9.4 mg/dL (ref 8.6–10.4)
CO2: 32 mmol/L — AB (ref 20–31)
CREATININE: 0.98 mg/dL (ref 0.50–0.99)
Chloride: 101 mmol/L (ref 98–110)
GFR, EST AFRICAN AMERICAN: 68 mL/min (ref >=60)
GFR, Est Non African American: 59 mL/min — ABNORMAL LOW (ref >=60)
Glucose, Bld: 110 mg/dL — ABNORMAL HIGH (ref 65–99)
Potassium: 3.7 mmol/L (ref 3.5–5.3)
Sodium: 140 mmol/L (ref 135–146)
TOTAL PROTEIN: 6.8 g/dL (ref 6.1–8.1)

## 2016-07-03 LAB — LIPID PANEL
CHOLESTEROL: 171 mg/dL (ref <200)
HDL: 89 mg/dL (ref >50)
LDL Cholesterol: 72 mg/dL
TRIGLYCERIDES: 51 mg/dL (ref <150)
Total CHOL/HDL Ratio: 1.9 Ratio (ref <5.0)
VLDL: 10 mg/dL (ref <30)

## 2016-07-03 LAB — TSH: TSH: 2.56 m[IU]/L

## 2016-07-08 ENCOUNTER — Ambulatory Visit (INDEPENDENT_AMBULATORY_CARE_PROVIDER_SITE_OTHER): Payer: PPO | Admitting: Family Medicine

## 2016-07-08 ENCOUNTER — Encounter: Payer: Self-pay | Admitting: Family Medicine

## 2016-07-08 VITALS — BP 118/78 | HR 62 | Resp 16 | Ht 62.0 in | Wt 179.0 lb

## 2016-07-08 DIAGNOSIS — E559 Vitamin D deficiency, unspecified: Secondary | ICD-10-CM | POA: Diagnosis not present

## 2016-07-08 DIAGNOSIS — I10 Essential (primary) hypertension: Secondary | ICD-10-CM | POA: Diagnosis not present

## 2016-07-08 DIAGNOSIS — Z23 Encounter for immunization: Secondary | ICD-10-CM

## 2016-07-08 DIAGNOSIS — D649 Anemia, unspecified: Secondary | ICD-10-CM | POA: Diagnosis not present

## 2016-07-08 DIAGNOSIS — E669 Obesity, unspecified: Secondary | ICD-10-CM

## 2016-07-08 DIAGNOSIS — E785 Hyperlipidemia, unspecified: Secondary | ICD-10-CM

## 2016-07-08 MED ORDER — PREDNISONE 5 MG PO TABS: 5.0000 mg | ORAL_TABLET | Freq: Two times a day (BID) | ORAL | 0 refills | Status: AC

## 2016-07-08 MED ORDER — GABAPENTIN 100 MG PO CAPS: ORAL_CAPSULE | ORAL | 3 refills | Status: DC

## 2016-07-08 MED ORDER — MONTELUKAST SODIUM 10 MG PO TABS: 10.0000 mg | ORAL_TABLET | Freq: Every day | ORAL | 1 refills | Status: DC

## 2016-07-08 NOTE — Patient Instructions (Addendum)
[facility] Dec 9 or after, call if you need me sooner  MD visit 5 months   Excellent labs  CBC, fasting cmp and vit D and  Lipids , Iron , ferritin and B12  In 5 month  Flu vaccine today  It is important that you exercise regularly at least 30 minutes 5 times a week. If you develop chest pain, have severe difficulty breathing, or feel very tired, stop exercising immediately and seek medical attention    Please work on good  health habits so that your health will improve. 1. Commitment to daily physical activity for 30 to 60  minutes, if you are able to do this.  2. Commitment to wise food choices. Aim for half of your  food intake to be vegetable and fruit, one quarter starchy foods, and one quarter protein. Try to eat on a regular schedule  3 meals per day, snacking between meals should be limited to vegetables or fruits or small portions of nuts. 64 ounces of water per day is generally recommended, unless you have specific health conditions, like heart failure or kidney failure where you will need to limit fluid intake.  3. Commitment to sufficient and a  good quality of physical and mental rest daily, generally between 6 to 8 hours per day.  WITH PERSISTANCE AND PERSEVERANCE, THE IMPOSSIBLE , BECOMES THE NORM!

## 2016-07-09 ENCOUNTER — Other Ambulatory Visit: Payer: Self-pay

## 2016-07-09 MED ORDER — LOVASTATIN 40 MG PO TABS: 40.0000 mg | ORAL_TABLET | Freq: Every day | ORAL | 1 refills | Status: DC

## 2016-07-09 MED ORDER — CARVEDILOL 3.125 MG PO TABS: ORAL_TABLET | ORAL | 1 refills | Status: DC

## 2016-07-10 ENCOUNTER — Other Ambulatory Visit: Payer: Self-pay

## 2016-07-10 ENCOUNTER — Encounter: Payer: Self-pay | Admitting: Cardiothoracic Surgery

## 2016-07-13 NOTE — Assessment & Plan Note (Signed)
Controlled, no change in medication DASH diet and commitment to daily physical activity for a minimum of 30 minutes discussed and encouraged, as a part of hypertension management. The importance of attaining a healthy weight is also discussed.  BP/Weight 07/08/2016 05/14/2016 02/18/2016 01/01/2016 08/02/2015 08/01/2015 [id number]  Systolic BP 123456 123456 [id number] A999333 [id number] A999333  Diastolic BP 78 80 68 70 76 74 68  Wt. (Lbs) 179 184 181.08 180 185.8 184.13 193.04  BMI 32.74 34.2 33.66 33.46 34.54 34.23 35.89

## 2016-07-13 NOTE — Assessment & Plan Note (Signed)
Hyperlipidemia:Low fat diet discussed and encouraged.   Lipid Panel  Lab Results  Component Value Date   CHOL 171 07/02/2016   HDL 89 07/02/2016   LDLCALC 72 07/02/2016   TRIG 51 07/02/2016   CHOLHDL 1.9 07/02/2016   Controlled, no change in medication

## 2016-07-13 NOTE — Progress Notes (Signed)
   Desiree [name]     MRN: [id number]      DOB: Dec 24, 1945   HPI Desiree [name] is here for follow up and re-evaluation of chronic medical conditions, medication management and review of any available recent lab and radiology data.  Preventive health is updated, specifically  Cancer screening and Immunization.   Questions or concerns regarding consultations or procedures which the PT has had in the interim are  addressed. The PT denies any adverse reactions to current medications since the last visit.  There are no new concerns.  There are no specific complaints   ROS Denies recent fever or chills. Denies sinus pressure, nasal congestion, ear pain or sore throat. Denies chest congestion, productive cough or wheezing. Denies chest pains, palpitations and leg swelling Denies abdominal pain, nausea, vomiting,diarrhea or constipation.   Denies dysuria, frequency, hesitancy or incontinence. Denies joint pain, swelling and limitation in mobility. Denies headaches, seizures, numbness, or tingling. Denies depression, anxiety or insomnia. Denies skin break down or rash.   PE  BP 118/78   Pulse 62   Resp 16   Ht 5\' 2"  (1.575 m)   Wt 179 lb (81.2 kg)   BMI 32.74 kg/m   Patient alert and oriented and in no cardiopulmonary distress.  HEENT: No facial asymmetry, EOMI,   oropharynx pink and moist.  Neck supple no JVD, no mass.  Chest: Clear to auscultation bilaterally.  CVS: S1, S2 no murmurs, no S3.Regular rate.  ABD: Soft non tender.   Ext: No edema  MS: Adequate ROM spine, shoulders, hips and knees.  Skin: Intact, no ulcerations or rash noted.  Psych: Good eye contact, normal affect. Memory intact not anxious or depressed appearing.  CNS: CN 2-12 intact, power,  normal throughout.no focal deficits noted.   Assessment & Plan  Essential hypertension Controlled, no change in medication DASH diet and commitment to daily physical activity for a minimum of 30 minutes discussed  and encouraged, as a part of hypertension management. The importance of attaining a healthy weight is also discussed.  BP/Weight 07/08/2016 05/14/2016 02/18/2016 01/01/2016 08/02/2015 08/01/2015 [id number]  Systolic BP 123456 123456 [id number] A999333 [id number] [id number]  Diastolic BP 78 80 68 70 76 74 68  Wt. (Lbs) 179 184 181.08 180 185.8 184.13 193.04  BMI 32.74 34.2 33.66 33.46 34.54 34.23 35.89       Hyperlipidemia LDL goal <100 Hyperlipidemia:Low fat diet discussed and encouraged.   Lipid Panel  Lab Results  Component Value Date   CHOL 171 07/02/2016   HDL 89 07/02/2016   LDLCALC 72 07/02/2016   TRIG 51 07/02/2016   CHOLHDL 1.9 07/02/2016   Controlled, no change in medication     Obesity (BMI 30-39.9) Improved, pt congratulated on this. Patient re-educated about  the importance of commitment to a  minimum of 150 minutes of exercise per week.  The importance of healthy food choices with portion control discussed. Encouraged to start a food diary, count calories and to consider  joining a support group. Sample diet sheets offered. Goals set by the patient for the next several months.   Weight /BMI 07/08/2016 05/14/2016 02/18/2016  WEIGHT 179 lb 184 lb 181 lb 1.3 oz  HEIGHT 5\' 2"  5' 1.5" 5' 1.5"  BMI 32.74 kg/m2 34.2 kg/m2 33.66 kg/m2      Need for prophylactic vaccination and inoculation against influenza After obtaining informed consent, the vaccine is  administered by LPN.

## 2016-07-13 NOTE — Assessment & Plan Note (Signed)
Improved, pt congratulated on this. Patient re-educated about  the importance of commitment to a  minimum of 150 minutes of exercise per week.  The importance of healthy food choices with portion control discussed. Encouraged to start a food diary, count calories and to consider  joining a support group. Sample diet sheets offered. Goals set by the patient for the next several months.   Weight /BMI 07/08/2016 05/14/2016 02/18/2016  WEIGHT 179 lb 184 lb 181 lb 1.3 oz  HEIGHT 5\' 2"  5' 1.5" 5' 1.5"  BMI 32.74 kg/m2 34.2 kg/m2 33.66 kg/m2

## 2016-07-13 NOTE — Assessment & Plan Note (Signed)
After obtaining informed consent, the vaccine is  administered by LPN.  

## 2016-07-22 DIAGNOSIS — I10 Essential (primary) hypertension: Secondary | ICD-10-CM | POA: Diagnosis not present

## 2016-07-22 DIAGNOSIS — R809 Proteinuria, unspecified: Secondary | ICD-10-CM | POA: Diagnosis not present

## 2016-07-22 DIAGNOSIS — N183 Chronic kidney disease, stage 3 (moderate): Secondary | ICD-10-CM | POA: Diagnosis not present

## 2016-07-22 DIAGNOSIS — E559 Vitamin D deficiency, unspecified: Secondary | ICD-10-CM | POA: Diagnosis not present

## 2016-07-22 DIAGNOSIS — Z79899 Other long term (current) drug therapy: Secondary | ICD-10-CM | POA: Diagnosis not present

## 2016-07-22 DIAGNOSIS — D509 Iron deficiency anemia, unspecified: Secondary | ICD-10-CM | POA: Diagnosis not present

## 2016-07-24 ENCOUNTER — Ambulatory Visit
Admission: RE | Admit: 2016-07-24 | Discharge: 2016-07-24 | Disposition: A | Discharge status: Home / Self Care | Payer: PPO | Source: Ambulatory Visit | Attending: Cardiothoracic Surgery | Admitting: Cardiothoracic Surgery

## 2016-07-24 ENCOUNTER — Encounter: Payer: Self-pay | Admitting: Cardiothoracic Surgery

## 2016-07-24 DIAGNOSIS — I712 Thoracic aortic aneurysm, without rupture, unspecified: Secondary | ICD-10-CM

## 2016-07-24 MED ORDER — IOPAMIDOL (ISOVUE-370) INJECTION 76%
75.0000 mL | Freq: Once | INTRAVENOUS | Status: AC | PRN
  Administered 2016-07-24: 75 mL via INTRAVENOUS

## 2016-07-30 DIAGNOSIS — E559 Vitamin D deficiency, unspecified: Secondary | ICD-10-CM | POA: Diagnosis not present

## 2016-07-30 DIAGNOSIS — N179 Acute kidney failure, unspecified: Secondary | ICD-10-CM | POA: Diagnosis not present

## 2016-07-30 DIAGNOSIS — N183 Chronic kidney disease, stage 3 (moderate): Secondary | ICD-10-CM | POA: Diagnosis not present

## 2016-07-30 DIAGNOSIS — D638 Anemia in other chronic diseases classified elsewhere: Secondary | ICD-10-CM | POA: Diagnosis not present

## 2016-07-31 ENCOUNTER — Encounter: Payer: Self-pay | Admitting: Cardiothoracic Surgery

## 2016-07-31 ENCOUNTER — Ambulatory Visit (INDEPENDENT_AMBULATORY_CARE_PROVIDER_SITE_OTHER): Payer: PPO | Admitting: Cardiothoracic Surgery

## 2016-07-31 VITALS — BP 130/73 | HR 57 | Resp 16 | Ht 61.0 in | Wt 183.0 lb

## 2016-07-31 DIAGNOSIS — I712 Thoracic aortic aneurysm, without rupture, unspecified: Secondary | ICD-10-CM

## 2016-07-31 NOTE — Progress Notes (Signed)
[address]       ,[place] [address]             [phone]                    Desiree [name] Medical Record [id number] Date of Birth: 02/21/1946  Referring: [name], MD Primary Care: Tula Nakayama, MD Cardiology: Dr  Bertrum Sol  Chief Complaint:    Chief Complaint  Patient presents with  . TAA    2 yr f/u with CTA CHEST    History of Present Illness:    Patient is a 70 year old female who has had a known dilated descending aorta since at least 2009 with serial CT scans of the chest having been performed. She was  referred to me two years ago after a recent CT scan . The patient was seen with right neck and shoulder pain since completely resolved. In the evaluation of this a repeat CT scan was performed to compare with previous scans. She is referred because of a 4.4 cm dilated ascending aorta. She has no known previous history of coronary artery disease.  She notes that she has been told that she had a stroke based on scans but does not know when this was or what the symptoms were if any. Patient's had no symptoms of congestive heart failure or angina.    Previous CT scans are reviewed with descending aorta 4.3 cm 2009 4.6 x 4.26 January 2009, 4.1 x 4.5 08/27/2009, January 2012 4.1 cm, on my measurement this appears unchanged from 2009.   Current Activity/ Functional Status:  Patient is independent with mobility/ambulation, transfers, ADL's, IADL's.  Zubrod Score: At the time of surgery this patient's most appropriate activity status/level should be described as: []  Normal activity, no symptoms [x]  Symptoms, fully ambulatory []  Symptoms, in bed less than or equal to 50% of the time []  Symptoms, in bed greater than 50% of the time but less than 100% []  Bedridden []  Moribund   Past Medical History:  Diagnosis Date  . Allergic rhinitis   . Arthritis HIPS/ WRISTS  . DDD (degenerative disc disease), cervical   . GERD  (gastroesophageal reflux disease) OCCASIONAL  . Heart murmur ASYMPTOMATIC  . History of Bell's palsy 2011  RIGHT SIDE -- RESOLVED  . 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 History of CVA (cerebrovascular accident) PER SCAN IN 2011  . Hyperlipidemia   . Hypertension   . Impaired fasting glucose PER PCP  DR SIMPSON   WATCH DIET  . Mixed urge and stress incontinence   . Nocturia   . Sinus drainage   . Thoracic ascending aortic aneurysm ([place]) LAST CHEST CT 09-02-2010--  FOLLOWED BY  CARDIOLOGIST--  DR [id number]  . Vaginal wall prolapse     Past Surgical History:  Procedure Laterality Date  . BILATERAL CARPAL TUNNEL RELEASE  1980'S  . CARDIAC CATHETERIZATION  02-01-2009  DR EICHHORN   NORMAL CORONARY ARTERIES/ NORMAL LV SIZE AND FUNCTION/ AORTIC ROOT SIZE AT THE UPPER LIMIT OF NORMAL   . CARPAL TUNNEL RELEASE    . CERVICAL DISC SURGERY  12-12-2005  DR Vertell Limber   LEFT  C6 - C7 HERINATED / DDD/ SPONDYLOSIS  . COLONOSCOPY N/A 02/10/2014   Procedure: COLONOSCOPY;  Surgeon: Danie Binder, MD;  Location: [facility];  Service: Endoscopy;  Laterality: N/A;  8:[place]  08/02/2012   Procedure: ANTERIOR REPAIR (CYSTOCELE);  Surgeon: Ailene Rud, MD;  Location: [facility];  Service: Urology;  Laterality: N/A;  [employer] Uphold Anterior Pelvic Floor Sacrospinus Repair. Anterior wall of the vagina.  . ESOPHAGOGASTRODUODENOSCOPY N/A 02/10/2014   Procedure: ESOPHAGOGASTRODUODENOSCOPY (EGD);  Surgeon: Danie Binder, MD;  Location: [facility];  Service: Endoscopy;  Laterality: N/A;  . GIVENS CAPSULE STUDY N/A 03/13/2014   Procedure: GIVENS CAPSULE STUDY;  Surgeon: Danie Binder, MD;  Location: [facility];  Service: Endoscopy;  Laterality: N/A;  7:30  . NECK SURGERY  2007  . RIGHT PARTIAL MASTECTOMY  11-11-2004  DR [name] YOUNG   DUCTAL CARCINOMA IN SITU RIGHT BREAST  . TOTAL  ABDOMINAL HYSTERECTOMY W/ BILATERAL SALPINGOOPHORECTOMY  1990  . TRANSTHORACIC ECHOCARDIOGRAM  03-30-2008  DR MARGARET SIMPSON   LV SIZE AND FUNCTION NORMAL/ MODERATE AORTIC ARCH DILATATION/ MILD MR    Family History  Problem Relation Age of Onset  . Heart attack Mother 72  . Kidney failure Mother   . Hypertension Mother   . Throat cancer Father 29  . Prostate cancer Father     History   Social History  . Marital Status: Divorced    Spouse Name: N/A    Number of Children: 48  . Years of Education: N/A   Occupational History  . Not on file.   Social History Main Topics  . Smoking status: Never Smoker   . Smokeless tobacco: Never Used  . Alcohol Use: No  . Drug Use: No  . Sexual Activity: Not on file   Other Topics Concern  . 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 Never Smoker  Smokeless Tobacco  . Never Used    History  Alcohol Use No     Allergies  Allergen Reactions  . Codeine Other (See Comments)    CONFUSION/ DIZZY    Current Outpatient Prescriptions  Medication Sig Dispense Refill  . acetaminophen (TYLENOL) 650 MG CR tablet Take 650 mg by mouth every 8 (eight) hours as needed for pain.    Marland Kitchen aspirin EC 325 MG tablet Take 1 tablet (325 mg total) by mouth daily. 100 tablet 3  . betamethasone dipropionate (DIPROLENE) 0.05 % cream Apply topically 2 (two) times daily. Apply sparingly twice daily to affected area for 5 days, then as needed 45 g 0  . calcium-vitamin D (OSCAL 500/200 D-3) 500-200 MG-UNIT per tablet Take 1 tablet by mouth 2 (two) times daily.     . carvedilol (COREG) 3.125 MG tablet TAKE 1 TABLET BY MOUTH TWICE DAILY WITH A MEAL 180 tablet 1  . cholecalciferol (VITAMIN D) 1000 UNITS tablet Take 1,000 Units by mouth daily.    Marland Kitchen gabapentin (NEURONTIN) 100 MG capsule Two capsules at bedtime at bedtime 180 capsule 3  . hydrochlorothiazide (HYDRODIURIL) 25 MG tablet TAKE ONE TABLET  BY MOUTH ONCE DAILY IN THE MORNING 90 tablet 1  . losartan (COZAAR) 50 MG tablet TAKE ONE TABLET BY MOUTH ONCE DAILY 90 tablet 3  . lovastatin (MEVACOR) 40 MG tablet Take 1 tablet (40 mg total) by mouth daily with breakfast. 90 tablet 1  . montelukast (SINGULAIR) 10 MG tablet Take 1 tablet (10 mg total) by mouth at bedtime. 90 tablet 1  . Multiple Vitamins-Minerals (MULTIVITAMINS THER. W/MINERALS) TABS tablet Take 1 tablet by mouth daily.    Marland Kitchen  solifenacin (VESICARE) 10 MG tablet Take 10 mg by mouth daily.     No current facility-administered medications for this visit.        Review of Systems:     Cardiac Review of Systems: Y or N  Chest Pain [  n  ]  Resting SOB [n   ] Exertional SOB  Blue.Reese  ]  Orthopnea Florencio.Farrier  ]   Pedal Edema [ n  ]    Palpitations [ y ] Syncope  [ n ]   Presyncope [n  ]  General Review of Systems: [Y] = yes [  ]=no Constitional: recent weight change [n  ]; anorexia [n  ]; fatigue Blue.Reese  ]; nausea [ n ]; night sweats [n  ]; fever [ n ]; or chills [n  ];                                                                                                                                          Dental: poor dentition[ n ]; Last Dentist visit: every 6 months  Eye : blurred vision [ n ]; diplopia [ n  ]; vision changes [ n];  Amaurosis fugax[  n]; Resp: cough [n  ];  wheezing[n ];  hemoptysis[ n ]; shortness of breath[ n ]; paroxysmal nocturnal dyspnea[  n]; dyspnea on exertion[ y ]; or orthopnea[ n ];  GI:  gallstones[n  ], vomiting[  ];  dysphagia[  ]; melena[  n];  hematochezia [  ]; heartburn[  ];   Hx of  Colonoscopy[y  ]; GU: kidney stones [  ]; hematuria[  ];   dysuria [  ];  nocturia[ n ];  history of     obstruction [ n ]; urinary frequency [ n ]             Skin: rash, swelling[  ];, hair loss[  ];  peripheral edema[  ];  or itching[  ]; Musculosketetal: myalgias[  ];  joint swelling[  ];  joint erythema[  ];  joint pain[  ];  back pain[  ];  Heme/Lymph: bruising[n  ];   bleeding[  ];  anemia[  ];  Neuro: TIA[ n ];  headaches[  ];  stroke[n  ];  vertigo[  ];  seizures[  ];   paresthesias[  ];  difficulty walking[  ];  Psych:depression[  ]; anxiety[  ];  Endocrine: diabetes[ n ];  thyroid dysfunction[n  ];  Immunizations: Flu [ next week ]; Pneumococcal[ y ];  Other:  Physical Exam: BP 130/73 (BP Location: Left Arm, Patient Position: Sitting, Cuff Size: Large)   Pulse (!) 57   Resp 16   Ht 5\' 1"  (1.549 m)   Wt 183 lb (83 kg)   SpO2 98% Comment: RA  BMI 34.58 kg/m   General appearance: alert, cooperative, appears stated age and no distress Neurologic: intact Heart: regular  rate and rhythm, S1, S2 normal, 2/6 early systolic murmur right sternal borderr, click, rub or gallop and normal apical impulse Lungs: clear to auscultation bilaterally and normal percussion bilaterally Abdomen: soft, non-tender; bowel sounds normal; no masses,  no organomegaly Extremities: extremities normal, atraumatic, no cyanosis or edema and Homans sign is negative, no sign of DVT Prominent right carotid pulse no carotid bruits DP and PT pulses are palpable bilaterally no cervical supraclavicular adenopathy Dg Cervical Spine Complete  Diagnostic Studies & Laboratory data:     Recent Radiology Findings: Ct Angio Chest Aorta W &/or Wo Contrast  Result Date: 07/24/2016 CLINICAL DATA:  Thoracic aortic aneurysm fall. Nonsmoker. History of right breast cancer with partial mastectomy. No chest complaints. EXAM: CT ANGIOGRAPHY CHEST WITH CONTRAST TECHNIQUE: Multidetector CT imaging of the chest was performed using the standard protocol during bolus administration of intravenous contrast. Multiplanar CT image reconstructions and MIPs were obtained to evaluate the vascular anatomy. CONTRAST:  75 cc of Isovue 370 IV COMPARISON:  06/29/2014 CT FINDINGS: Cardiovascular: There has been no significant interval change in the appearance of the fusiform ascending thoracic aortic aneurysm. Greatest  ascending aortic dimension is 4.3 cm and along the arch, 3.3 cm. No descending thoracic aortic aneurysm. There is no aortic dissection, wall thickening or evidence of leak. Normal heart size. No pericardial effusion. Great vessels are unremarkable with normal takeoff anatomy. Mediastinum/Nodes: No enlarged mediastinal, hilar, or axillary lymph nodes. Thyroid gland, trachea, and esophagus demonstrate no significant findings. Lungs/Pleura: Bibasilar dependent atelectasis. No pneumothorax or effusion. Minimal scarring in the right middle lobe versus atelectasis. No suspicious nodules or pulmonary masses. Upper Abdomen: Stable incidental hepatic cysts. Postsurgical changes right breast. 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 No suspicious osseous lesions. Review of the MIP images confirms the above findings. IMPRESSION: Stable fusiform ascending aortic aneurysm measuring up to 4.3 cm maximum, stable since 2015. No acute pulmonary abnormality. Electronically Signed   By: Ashley Royalty M.D.   On: 07/24/2016 14:51       06/06/2013   CLINICAL DATA:  Neck pain with right-sided radicular symptoms  EXAM: CERVICAL SPINE  4+ VIEWS  COMPARISON:  None.  FINDINGS: Frontal, lateral, open-mouth odontoid, and bilateral oblique views were obtained. There is no fracture or spondylolisthesis. Prevertebral soft tissues and predental space regions are normal.  There is fairly marked disc space narrowing at C4-5. There is moderate disc space narrowing at C7-T1. There is mild disc space narrowing at C3-4. There is the exit foraminal narrowing at C4-5 due to the facet hypertrophy.  There is a small cervical rib on the right.  IMPRESSION: Multilevel osteoarthritic change. No fracture or spondylolisthesis. Small cervical rib on the right.   Electronically Signed   By: Lowella Grip M.D.   On: 06/06/2013 21:19   Dg Thoracic Spine W/swimmers  06/06/2013   CLINICAL DATA:  Pain with radicular symptoms   EXAM: THORACIC SPINE - 2 VIEW + SWIMMERS  COMPARISON:  None.  FINDINGS: Frontal, lateral, and swimmer's views were obtained. There is no fracture or spondylolisthesis. There the is multilevel disc space narrowing with several small osteophytes consistent with osteoarthritic change. No erosive change.  IMPRESSION: Multilevel osteoarthritic change. No fracture or spondylolisthesis.   Electronically Signed   By: Lowella Grip M.D.   On: 06/06/2013 21:20   Ct Angio Chest Aorta W/cm &/or Wo/cm  06/09/2013   CLINICAL DATA:  Aneurysm  EXAM: CT ANGIOGRAPHY CHEST WITH CONTRAST  TECHNIQUE: Multidetector CT imaging of the chest  was performed using the standard protocol during bolus administration of intravenous contrast. Multiplanar CT image reconstructions including MIPs were obtained to evaluate the vascular anatomy.  CONTRAST:  16mL OMNIPAQUE IOHEXOL 350 MG/ML SOLN  COMPARISON:  09/02/2010  FINDINGS: Maximal aortic diameter at the sinus of Valsalva, sino-tubular junction, and ascending aorta are 3.4 cm, 2.9 cm, and 4.5 cm respectively. Previously, the maximal diameter in the ascending aorta was 4.2 cm. No evidence of dissection.  Origins of the great vessels are patent. Right subclavian and common carotid arteries are patent. Visualized vertebral arteries are patent.  No obvious filling defect in the pulmonary arterial tree to suggest acute pulmonary thromboembolism.  No abnormal mediastinal adenopathy or pericardial effusion.  Scattered linear atelectasis in the lungs without consolidation or mass.  Degenerative disc disease within the thoracic spine. No compression deformity. Stable liver cyst.  Review of the MIP images confirms the above findings.  IMPRESSION: Ascending aortic aneurysm has increased from 4.2 cm to 4.5 cm in maximal diameter.   Electronically Signed   By: Maryclare Bean M.D.   On: 06/09/2013 07:39    ECHO: 03/30/2008: SUMMARY - Overall left ventricular systolic function was normal. There were no  left ventricular regional wall motion abnormalities. There was moderate asymmetric septal hypertrophy. - The AV leaflets themselves appear normal with normal opening. However, there are increased velocities and gradients. I do not appreciate a subvalvular membrane and there does not appear to be LVOT obstruction from ASH. Consider TEE or cardiac CT if clinically indicated for enhanced visualization. The mean transaortic valve gradient was 11 mmHg. Estimated aortic valve area (by VTI) was 1.74 cm^2. Estimated aortic valve area (by Vmax) was 1.72 cm^2. - There appears to be a linear density in the prox asc aorta without definitive color flow appreciated on doppler in this area. This is not seen in all views. Additionally, the prox asc aorta is mild to mod dilated in this region. Recommend TEE or cardiac/chest CT to eval for poss aneurysm and/or dissection if clinically indicated. There was moderate aortic arch dilatation. - There was mild mitral valvular regurgitation. The effective orifice of mitral regurgitation by proximal isovelocity surface area was 0.21 cm^2. The volume of mitral regurgitation by proximal isovelocity surface area was 26 cc. - The estimated peak right ventricular systolic pressure was mildly increased.   Recent Lab Findings: Lab Results  Component Value Date   WBC 4.1 02/19/2015   HGB 10.4 (L) 02/19/2015   HCT 32.4 (L) 02/19/2015   PLT 304 02/19/2015   GLUCOSE 110 (H) 07/02/2016   CHOL 171 07/02/2016   TRIG 51 07/02/2016   HDL 89 07/02/2016   LDLCALC 72 07/02/2016   ALT 14 07/02/2016   AST 29 07/02/2016   NA 140 07/02/2016   K 3.7 07/02/2016   CL 101 07/02/2016   CREATININE 0.98 07/02/2016   BUN 20 07/02/2016   CO2 32 (H) 07/02/2016   TSH 2.56 07/02/2016   HGBA1C 5.6 05/27/2014   Chronic Kidney Disease   Stage I     GFR >90  Stage II    GFR 60-89  Stage IIIA GFR 45-59  Stage IIIB GFR 30-44  Stage IV   GFR 15-29  Stage V    GFR  <15  Lab  Results  Component Value Date   CREATININE 0.98 07/02/2016   CrCl cannot be calculated (Patient's most recent lab result is older than the maximum 21 days allowed.).  Aortic Size Index=     4.5 cm    /  Body surface area is 1.89 meters squared. =2.36  < 2.75 cm/m2      4% risk per year 2.75 to 4.25          8% risk per year > 4.25 cm/m2    20% risk per year    Assessment / Plan:    Stable fusiform ascending aortic aneurysm measuring up to 4.3 cm maximum, stable since 2015., stable since [id number] Systolic ejection murmur No family history of aortic aneurysm, aortic dissection, or sudden death at early age of unknown cause History of hypertension currently treated with a blocker and ARB Stage IIIa chronic renal disease   I discussed with the patient the diagnosis of dilated ascending aorta and risks of rupture and dissection. With  stability since 2009  and at the most 4.3 cm in size I have recommended to the patient continued observation with good blood pressure control including beta blocker.  I'll plan to see her back in 1 years. With follow up CTA of chest    Grace Isaac MD      [address] [place] [address] Office [phone]   Beeper [phone]

## 2016-08-05 ENCOUNTER — Ambulatory Visit (INDEPENDENT_AMBULATORY_CARE_PROVIDER_SITE_OTHER): Payer: PPO

## 2016-08-05 VITALS — BP 128/78 | HR 51 | Temp 97.6°F | Resp 16 | Ht 61.0 in | Wt 182.0 lb

## 2016-08-05 DIAGNOSIS — Z0000 Encounter for general adult medical examination without abnormal findings: Secondary | ICD-10-CM | POA: Diagnosis not present

## 2016-08-05 NOTE — Patient Instructions (Addendum)
Advance directive discussed with patient today. Copy provided for patient to complete at home and have notarized. Patient agrees to have copy sent to our office once it is complete.  Health maintenance: Up to date on all Immunizations, and screening tests. Will discuss repeating a bone density test with Dr. Moshe Cipro. If Dr. Moshe Cipro would like to order a repeat bone density someone from our office will give you a call.   Abnormal screenings: None    Patient concerns: Weight   Nurse concerns: Recommend increasing exercise to at least 4 times a week, or to 60 minutes 3 times a week as tolerated   Next PCP appt: 12/08/2016 at 2:20pm with Dr. Moshe Cipro   Health Maintenance, Female Introduction Adopting a healthy lifestyle and getting preventive care can go a long way to promote health and wellness. Talk with your health care provider about what schedule of regular examinations is right for you. This is a good chance for you to check in with your provider about disease prevention and staying healthy. In between checkups, there are plenty of things you can do on your own. Experts have done a lot of research about which lifestyle changes and preventive measures are most likely to keep you healthy. Ask your health care provider for more information. Weight and diet Eat a healthy diet  Be sure to include plenty of vegetables, fruits, low-fat dairy products, and lean protein.  Do not eat a lot of foods high in solid fats, added sugars, or salt.  Get regular exercise. This is one of the most important things you can do for your health.  Most adults should exercise for at least 150 minutes each week. The exercise should increase your heart rate and make you sweat (moderate-intensity exercise).  Most adults should also do strengthening exercises at least twice a week. This is in addition to the moderate-intensity exercise. Maintain a healthy weight  Body mass index (BMI) is a measurement that can be  used to identify possible weight problems. It estimates body fat based on height and weight. Your health care provider can help determine your BMI and help you achieve or maintain a healthy weight.  For females 4 years of age and older:  A BMI below 18.5 is considered underweight.  A BMI of 18.5 to 24.9 is normal.  A BMI of 25 to 29.9 is considered overweight.  A BMI of 30 and above is considered obese. Watch levels of cholesterol and blood lipids  You should start having your blood tested for lipids and cholesterol at 69 years of age, then have this test every 5 years.  You may need to have your cholesterol levels checked more often if:  Your lipid or cholesterol levels are high.  You are older than 69 years of age.  You are at high risk for heart disease. Cancer screening Lung Cancer  Lung cancer screening is recommended for adults 44-65 years old who are at high risk for lung cancer because of a history of smoking.  A yearly low-dose CT scan of the lungs is recommended for people who:  Currently smoke.  Have quit within the past 15 years.  Have at least a 30-pack-year history of smoking. A pack year is smoking an average of one pack of cigarettes a day for 1 year.  Yearly screening should continue until it has been 15 years since you quit.  Yearly screening should stop if you develop a health problem that would prevent you from having lung cancer  treatment. Breast Cancer  Practice breast self-awareness. This means understanding how your breasts normally appear and feel.  It also means doing regular breast self-exams. Let your health care provider know about any changes, no matter how small.  If you are in your 20s or 30s, you should have a clinical breast exam (CBE) by a health care provider every 1-3 years as part of a regular health exam.  If you are 85 or older, have a CBE every year. Also consider having a breast X-ray (mammogram) every year.  If you have a  family history of breast cancer, talk to your health care provider about genetic screening.  If you are at high risk for breast cancer, talk to your health care provider about having an MRI and a mammogram every year.  Breast cancer gene (BRCA) assessment is recommended for women who have family members with BRCA-related cancers. BRCA-related cancers include:  Breast.  Ovarian.  Tubal.  Peritoneal cancers.  Results of the assessment will determine the need for genetic counseling and BRCA1 and BRCA2 testing. Cervical Cancer  Your health care provider may recommend that you be screened regularly for cancer of the pelvic organs (ovaries, uterus, and vagina). This screening involves a pelvic examination, including checking for microscopic changes to the surface of your cervix (Pap test). You may be encouraged to have this screening done every 3 years, beginning at age 47.  For women ages 34-65, health care providers may recommend pelvic exams and Pap testing every 3 years, or they may recommend the Pap and pelvic exam, combined with testing for human papilloma virus (HPV), every 5 years. Some types of HPV increase your risk of cervical cancer. Testing for HPV may also be done on women of any age with unclear Pap test results.  Other health care providers may not recommend any screening for nonpregnant women who are considered low risk for pelvic cancer and who do not have symptoms. Ask your health care provider if a screening pelvic exam is right for you.  If you have had past treatment for cervical cancer or a condition that could lead to cancer, you need Pap tests and screening for cancer for at least 20 years after your treatment. If Pap tests have been discontinued, your risk factors (such as having a new sexual partner) need to be reassessed to determine if screening should resume. Some women have medical problems that increase the chance of getting cervical cancer. In these cases, your health  care provider may recommend more frequent screening and Pap tests. Colorectal Cancer  This type of cancer can be detected and often prevented.  Routine colorectal cancer screening usually begins at 69 years of age and continues through 69 years of age.  Your health care provider may recommend screening at an earlier age if you have risk factors for colon cancer.  Your health care provider may also recommend using home test kits to check for hidden blood in the stool.  A small camera at the end of a tube can be used to examine your colon directly (sigmoidoscopy or colonoscopy). This is done to check for the earliest forms of colorectal cancer.  Routine screening usually begins at age 27.  Direct examination of the colon should be repeated every 5-10 years through 69 years of age. However, you may need to be screened more often if early forms of precancerous polyps or small growths are found. Skin Cancer  Check your skin from head to toe regularly.  Tell your  health care provider about any new moles or changes in moles, especially if there is a change in a mole's shape or color.  Also tell your health care provider if you have a mole that is larger than the size of a pencil eraser.  Always use sunscreen. Apply sunscreen liberally and repeatedly throughout the day.  Protect yourself by wearing long sleeves, pants, a wide-brimmed hat, and sunglasses whenever you are outside. Heart disease, diabetes, and high blood pressure  High blood pressure causes heart disease and increases the risk of stroke. High blood pressure is more likely to develop in:  People who have blood pressure in the high end of the normal range (130-139/85-89 mm Hg).  People who are overweight or obese.  People who are African American.  If you are 52-51 years of age, have your blood pressure checked every 3-5 years. If you are 69 years of age or older, have your blood pressure checked every year. You should have  your blood pressure measured twice-once when you are at a hospital or clinic, and once when you are not at a hospital or clinic. Record the average of the two measurements. To check your blood pressure when you are not at a hospital or clinic, you can use:  An automated blood pressure machine at a pharmacy.  A home blood pressure monitor.  If you are between 45 years and 32 years old, ask your health care provider if you should take aspirin to prevent strokes.  Have regular diabetes screenings. This involves taking a blood sample to check your fasting blood sugar level.  If you are at a normal weight and have a low risk for diabetes, have this test once every three years after 69 years of age.  If you are overweight and have a high risk for diabetes, consider being tested at a younger age or more often. Preventing infection Hepatitis B  If you have a higher risk for hepatitis B, you should be screened for this virus. You are considered at high risk for hepatitis B if:  You were born in a country where hepatitis B is common. Ask your health care provider which countries are considered high risk.  Your parents were born in a high-risk country, and you have not been immunized against hepatitis B (hepatitis B vaccine).  You have HIV or AIDS.  You use needles to inject street drugs.  You live with someone who has hepatitis B.  You have had sex with someone who has hepatitis B.  You get hemodialysis treatment.  You take certain medicines for conditions, including cancer, organ transplantation, and autoimmune conditions. Hepatitis C  Blood testing is recommended for:  Everyone born from [birth year] through 1964.  Anyone with known risk factors for hepatitis C. Sexually transmitted infections (STIs)  You should be screened for sexually transmitted infections (STIs) including gonorrhea and chlamydia if:  You are sexually active and are younger than 69 years of age.  You are older than  69 years of age and your health care provider tells you that you are at risk for this type of infection.  Your sexual activity has changed since you were last screened and you are at an increased risk for chlamydia or gonorrhea. Ask your health care provider if you are at risk.  If you do not have HIV, but are at risk, it may be recommended that you take a prescription medicine daily to prevent HIV infection. This is called pre-exposure prophylaxis (PrEP). You are  considered at risk if:  You are sexually active and do not regularly use condoms or know the HIV status of your partner(s).  You take drugs by injection.  You are sexually active with a partner who has HIV. Talk with your health care provider about whether you are at high risk of being infected with HIV. If you choose to begin PrEP, you should first be tested for HIV. You should then be tested every 3 months for as long as you are taking PrEP. Pregnancy  If you are premenopausal and you may become pregnant, ask your health care provider about preconception counseling.  If you may become pregnant, take 400 to 800 micrograms (mcg) of folic acid every day.  If you want to prevent pregnancy, talk to your health care provider about birth control (contraception). Osteoporosis and menopause  Osteoporosis is a disease in which the bones lose minerals and strength with aging. This can result in serious bone fractures. Your risk for osteoporosis can be identified using a bone density scan.  If you are 65 years of age or older, or if you are at risk for osteoporosis and fractures, ask your health care provider if you should be screened.  Ask your health care provider whether you should take a calcium or vitamin D supplement to lower your risk for osteoporosis.  Menopause may have certain physical symptoms and risks.  Hormone replacement therapy may reduce some of these symptoms and risks. Talk to your health care provider about whether  hormone replacement therapy is right for you. Follow these instructions at home:  Schedule regular health, dental, and eye exams.  Stay current with your immunizations.  Do not use any tobacco products including cigarettes, chewing tobacco, or electronic cigarettes.  If you are pregnant, do not drink alcohol.  If you are breastfeeding, limit how much and how often you drink alcohol.  Limit alcohol intake to no more than 1 drink per day for nonpregnant women. One drink equals 12 ounces of beer, 5 ounces of wine, or 1 ounces of hard liquor.  Do not use street drugs.  Do not share needles.  Ask your health care provider for help if you need support or information about quitting drugs.  Tell your health care provider if you often feel depressed.  Tell your health care provider if you have ever been abused or do not feel safe at home. This information is not intended to replace advice given to you by your health care provider. Make sure you discuss any questions you have with your health care provider. Document Released: 02/24/2011 Document Revised: 01/17/2016 Document Reviewed: 05/15/2015  2017 Elsevier

## 2016-08-05 NOTE — Progress Notes (Signed)
Subjective:   Desiree [name] is a 71 y.o. female who presents for Medicare Annual (Subsequent) preventive examination.  Review of Systems:  Cardiac Risk Factors include: advanced age (>19men, >29 women);dyslipidemia;family history of premature cardiovascular disease;hypertension;obesity (BMI >30kg/m2)     Objective:     Vitals: BP 128/78   Pulse (!) 51   Temp 97.6 F (36.4 C) (Oral)   Resp 16   Ht 5\' 1"  (1.549 m)   Wt 182 lb 0.6 oz (82.6 kg)   SpO2 95%   BMI 34.40 kg/m   Body mass index is 34.4 kg/m.   Tobacco History  Smoking Status  . Never Smoker  Smokeless Tobacco  . Never Used     Counseling given: Not Answered   Past Medical History:  Diagnosis Date  . Allergic rhinitis   . Arthritis HIPS/ WRISTS  . DDD (degenerative disc disease), cervical   . GERD (gastroesophageal reflux disease) OCCASIONAL  . Heart murmur ASYMPTOMATIC  . History of Bell's palsy 2011  RIGHT SIDE -- RESOLVED  . 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 History of CVA (cerebrovascular accident) PER SCAN IN 2011  . Hyperlipidemia   . Hypertension   . Impaired fasting glucose PER PCP  DR SIMPSON   WATCH DIET  . Mixed urge and stress incontinence   . Nocturia   . Sinus drainage   . Thoracic ascending aortic aneurysm ([place]) LAST CHEST CT 09-02-2010--  FOLLOWED BY  CARDIOLOGIST--  DR NO:[id number]  . Vaginal wall prolapse    Past Surgical History:  Procedure Laterality Date  . BILATERAL CARPAL TUNNEL RELEASE  1980'S  . CARDIAC CATHETERIZATION  02-01-2009  DR EICHHORN   NORMAL CORONARY ARTERIES/ NORMAL LV SIZE AND FUNCTION/ AORTIC ROOT SIZE AT THE UPPER LIMIT OF NORMAL   . CARPAL TUNNEL RELEASE    . CERVICAL DISC SURGERY  12-12-2005  DR Vertell Limber   LEFT  C6 - C7 HERINATED / DDD/ SPONDYLOSIS  . COLONOSCOPY N/A 02/10/2014   Procedure: COLONOSCOPY;  Surgeon: Danie Binder, MD;  [facility];  Service: Endoscopy;   Laterality: N/A;  8:30  . CYSTOCELE REPAIR  08/02/2012   Procedure: ANTERIOR REPAIR (CYSTOCELE);  Surgeon: Ailene Rud, MD;  Location: [facility];  Service: Urology;  Laterality: N/A;  [employer] Uphold Anterior Pelvic Floor Sacrospinus Repair. Anterior wall of the vagina.  . ESOPHAGOGASTRODUODENOSCOPY N/A 02/10/2014   Procedure: ESOPHAGOGASTRODUODENOSCOPY (EGD);  Surgeon: Danie Binder, MD;  [facility];  Service: Endoscopy;  Laterality: N/A;  . GIVENS CAPSULE STUDY N/A 03/13/2014   Procedure: GIVENS CAPSULE STUDY;  Surgeon: Danie Binder, MD;  [facility];  Service: Endoscopy;  Laterality: N/A;  7:30  . NECK SURGERY  2007  . RIGHT PARTIAL MASTECTOMY  11-11-2004  DR [name] YOUNG   DUCTAL CARCINOMA IN SITU RIGHT BREAST  . TOTAL ABDOMINAL HYSTERECTOMY W/ BILATERAL SALPINGOOPHORECTOMY  1990  . TRANSTHORACIC ECHOCARDIOGRAM  03-30-2008  DR MARGARET SIMPSON   LV SIZE AND FUNCTION NORMAL/ MODERATE AORTIC ARCH DILATATION/ MILD MR   Family History  Problem Relation Age of Onset  . Kidney failure Mother   . Hypertension Mother   . Heart attack Mother 23  . Throat cancer Father   . Prostate cancer Father   . Cancer Father 71    lung   . Cancer Sister     Breast  . Hypertension  Brother   . Colon cancer Neg Hx   . Colon polyps Neg Hx    History  Sexual Activity  . Sexual activity: Not Currently    Outpatient Encounter Prescriptions as of 08/05/2016  Medication Sig  . acetaminophen (TYLENOL) 650 MG CR tablet Take 650 mg by mouth every 8 (eight) hours as needed for pain.  Marland Kitchen aspirin EC 325 MG tablet Take 1 tablet (325 mg total) by mouth daily.  . betamethasone dipropionate (DIPROLENE) 0.05 % cream Apply topically 2 (two) times daily. Apply sparingly twice daily to affected area for 5 days, then as needed  . calcium-vitamin D (OSCAL 500/200 D-3) 500-200 MG-UNIT per tablet Take 1 tablet by mouth 2 (two) times daily.   . carvedilol (COREG)  3.125 MG tablet TAKE 1 TABLET BY MOUTH TWICE DAILY WITH A MEAL  . cholecalciferol (VITAMIN D) 1000 UNITS tablet Take 1,000 Units by mouth daily.  Marland Kitchen gabapentin (NEURONTIN) 100 MG capsule Two capsules at bedtime at bedtime  . hydrochlorothiazide (HYDRODIURIL) 25 MG tablet TAKE ONE TABLET BY MOUTH ONCE DAILY IN THE MORNING  . losartan (COZAAR) 50 MG tablet TAKE ONE TABLET BY MOUTH ONCE DAILY  . lovastatin (MEVACOR) 40 MG tablet Take 1 tablet (40 mg total) by mouth daily with breakfast. (Patient taking differently: Take 40 mg by mouth daily. )  . montelukast (SINGULAIR) 10 MG tablet Take 1 tablet (10 mg total) by mouth at bedtime.  . Multiple Vitamins-Minerals (MULTIVITAMINS THER. W/MINERALS) TABS tablet Take 1 tablet by mouth daily.  . solifenacin (VESICARE) 10 MG tablet Take 10 mg by mouth daily.   No facility-administered encounter medications on file as of 08/05/2016.     Activities of Daily Living In your present state of health, do you have any difficulty performing the following activities: 08/05/2016 02/18/2016  Hearing? N N  Vision? N N  Difficulty concentrating or making decisions? Y N  Walking or climbing stairs? N N  Dressing or bathing? N N  Doing errands, shopping? N N  Preparing Food and eating ? N -  Using the Toilet? N -  In the past six months, have you accidently leaked urine? N -  Do you have problems with loss of bowel control? N -  Managing your Medications? N -  Managing your Finances? N -  Housekeeping or managing your Housekeeping? N -  Some recent data might be hidden    Patient Care Team: [name], MD as PCP - General Grace Isaac, MD as Consulting Physician (Cardiothoracic Surgery) [name], MD as Consulting Physician (Cardiology) Hortencia Pilar, MD as Consulting Physician (Ophthalmology) Carolan Clines, MD as Consulting Physician (Urology)    Assessment:    Exercise Activities and Dietary recommendations Current Exercise  Habits: Structured exercise class, Type of exercise: treadmill, Time (Minutes): 30, Frequency (Times/Week): 3, Weekly Exercise (Minutes/Week): 90, Intensity: Mild, Exercise limited by: cardiac condition(s)  Goals    . Weight (lb) < 175 lb (79.4 kg)          Starting 08/05/2016 patient would like to maintain her current exercise routine and slowly increase to 40 minutes 3 times a week.      Fall Risk Fall Risk  08/05/2016 08/02/2015 02/15/2015 05/22/2014 07/13/2013  Falls in the past year? No No No No No  Risk for fall due to : Impaired vision - - - -   Depression Screen PHQ 2/9 Scores 08/05/2016 07/08/2016 02/15/2015 11/29/2013  PHQ - 2 Score 0 0 0 1  PHQ-  9 Score - 0 - 1     Cognitive Function MMSE - Mini Mental State Exam 09/21/2014  Orientation to time 5  Orientation to Place 5  Registration 3  Attention/ Calculation 4  Recall 3  Language- name 2 objects 2  Language- repeat 1  Language- follow 3 step command 2  Language- read & follow direction 1  Write a sentence 1  Copy design 1  Total score 28     6CIT Screen 08/05/2016  What Year? 0 points  What month? 0 points  What time? 0 points  Count back from 20 0 points  Months in reverse 0 points  Repeat phrase 0 points  Total Score 0    Immunization History  Administered Date(s) Administered  . Influenza Split 07/15/2011, 06/30/2012  . Influenza Whole 06/13/2010  . Influenza,inj,Quad PF,36+ Mos 07/13/2013, 05/22/2014, 06/12/2015, 07/08/2016  . Pneumococcal Conjugate-13 09/21/2014  . Pneumococcal Polysaccharide-23 11/11/2011  . Tdap 07/15/2011  . Zoster 11/09/2006   Screening Tests Health Maintenance  Topic Date Due  . MAMMOGRAM  01/13/2018  . TETANUS/TDAP  07/14/2021  . COLONOSCOPY  02/13/2024  . INFLUENZA VACCINE  Completed  . DEXA SCAN  Completed  . ZOSTAVAX  Completed  . Hepatitis C Screening  Completed  . PNA vac Low Risk Adult  Completed      Plan:  I have personally reviewed and addressed the  Medicare Annual Wellness questionnaire and have noted the following in the patient's chart:  A. Medical and social history B. Use of alcohol, tobacco or illicit drugs  C. Current medications and supplements D. Functional ability and status E.  Nutritional status F.  Physical activity G. Advance directives H. List of other physicians I.  Hospitalizations, surgeries, and ER visits in previous 12 months J.  [place] to include hearing, vision, cognitive, depression L. Referrals and appointments - none  In addition, I have reviewed and discussed with patient certain preventive protocols, quality metrics, and best practice recommendations. A written personalized care plan for preventive services as well as general preventive health recommendations were provided to patient.  Signed,   Stormy Fabian, LPN Lead Nurse Health Advisor

## 2016-08-31 ENCOUNTER — Other Ambulatory Visit: Payer: Self-pay | Admitting: Cardiovascular Disease

## 2016-09-01 ENCOUNTER — Ambulatory Visit: Payer: Self-pay | Admitting: Cardiovascular Disease

## 2016-09-20 DIAGNOSIS — Z79899 Other long term (current) drug therapy: Secondary | ICD-10-CM | POA: Diagnosis not present

## 2016-09-20 DIAGNOSIS — N183 Chronic kidney disease, stage 3 (moderate): Secondary | ICD-10-CM | POA: Diagnosis not present

## 2016-09-20 DIAGNOSIS — I10 Essential (primary) hypertension: Secondary | ICD-10-CM | POA: Diagnosis not present

## 2016-09-30 ENCOUNTER — Encounter: Payer: Self-pay | Admitting: Cardiovascular Disease

## 2016-09-30 ENCOUNTER — Ambulatory Visit (INDEPENDENT_AMBULATORY_CARE_PROVIDER_SITE_OTHER): Payer: PPO | Admitting: Cardiovascular Disease

## 2016-09-30 VITALS — BP 132/78 | HR 59 | Ht 61.5 in | Wt 185.2 lb

## 2016-09-30 DIAGNOSIS — E669 Obesity, unspecified: Secondary | ICD-10-CM

## 2016-09-30 DIAGNOSIS — N183 Chronic kidney disease, stage 3 unspecified: Secondary | ICD-10-CM

## 2016-09-30 DIAGNOSIS — I712 Thoracic aortic aneurysm, without rupture, unspecified: Secondary | ICD-10-CM

## 2016-09-30 DIAGNOSIS — E7800 Pure hypercholesterolemia, unspecified: Secondary | ICD-10-CM

## 2016-09-30 DIAGNOSIS — I10 Essential (primary) hypertension: Secondary | ICD-10-CM

## 2016-09-30 NOTE — Progress Notes (Signed)
Cardiology Office Note    Date:  10/01/2016   ID:  [name], [name] 1946-02-10, MRN [id number]  PCP:  Tula Nakayama, MD  Cardiologist:   [name], MD   Chief Complaint  Patient presents with  . Yearly visit    pt c/o occasional CP on left side and acid reflux; occasional dizziness--cannot get up too fast; no other Sx.    History of Present Illness:  Desiree [name] is a 71 y.o. female with a moderate size asymptomatic aneurysm of the aorta (4.3 cm, stable since 2009), Reported history of ischemic stroke. CT of the head without neurological deficits, moderate chronic kidney disease, hypertension, hyperlipidemia. She recently saw Dr. Servando Snare in follow-up for her aortic aneurysm. Sees Dr. Lowanda Foster in [place] for renal insufficiency. She has not had any interim serious medical problems.  Occasional left-sided chest discomfort that sounds highly compatible with acid reflux and is associated with meals. She has occasional orthostatic dizziness. The patient specifically denies any chest pain with exertion, dyspnea at rest or with exertion, orthopnea, paroxysmal nocturnal dyspnea, syncope, palpitations, focal neurological deficits, intermittent claudication, lower extremity edema, unexplained weight gain, cough, hemoptysis or wheezing.  Her nephrologist reduced the dose of both her losartan and her hydrochlorothiazide. Blood pressure control remains fair. Typically at home her systolic blood pressure is in the 120s or just over 130. She remains mild to moderately obese.   Past Medical History:  Diagnosis Date  . Allergic rhinitis   . Arthritis HIPS/ WRISTS  . DDD (degenerative disc disease), cervical   . GERD (gastroesophageal reflux disease) OCCASIONAL  . Heart murmur ASYMPTOMATIC  . History of Bell's palsy 2011  RIGHT SIDE -- RESOLVED  . 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 History of CVA  (cerebrovascular accident) PER SCAN IN 2011  . Hyperlipidemia   . Hypertension   . Impaired fasting glucose PER PCP  DR SIMPSON   WATCH DIET  . Mixed urge and stress incontinence   . Nocturia   . Sinus drainage   . Thoracic ascending aortic aneurysm ([place]) LAST CHEST CT 09-02-2010--  FOLLOWED BY  CARDIOLOGIST--  DR NO:[id number]  . Vaginal wall prolapse     Past Surgical History:  Procedure Laterality Date  . BILATERAL CARPAL TUNNEL RELEASE  1980'S  . CARDIAC CATHETERIZATION  02-01-2009  DR EICHHORN   NORMAL CORONARY ARTERIES/ NORMAL LV SIZE AND FUNCTION/ AORTIC ROOT SIZE AT THE UPPER LIMIT OF NORMAL   . CARPAL TUNNEL RELEASE    . CERVICAL DISC SURGERY  12-12-2005  DR Vertell Limber   LEFT  C6 - C7 HERINATED / DDD/ SPONDYLOSIS  . COLONOSCOPY N/A 02/10/2014   Procedure: COLONOSCOPY;  Surgeon: Danie Binder, MD;  [facility];  Service: Endoscopy;  Laterality: N/A;  8:30  . CYSTOCELE REPAIR  08/02/2012   Procedure: ANTERIOR REPAIR (CYSTOCELE);  Surgeon: Ailene Rud, MD;  Location: [facility];  Service: Urology;  Laterality: N/A;  [employer] Uphold Anterior Pelvic Floor Sacrospinus Repair. Anterior wall of the vagina.  . ESOPHAGOGASTRODUODENOSCOPY N/A 02/10/2014   Procedure: ESOPHAGOGASTRODUODENOSCOPY (EGD);  Surgeon: Danie Binder, MD;  [facility]: [facility];  Service: Endoscopy;  Laterality: N/A;  . GIVENS CAPSULE STUDY N/A 03/13/2014   Procedure: GIVENS CAPSULE STUDY;  Surgeon: Danie Binder, MD;  Location: [facility];  Service: Endoscopy;  Laterality: N/A;  7:30  . NECK SURGERY  2007  .  RIGHT PARTIAL MASTECTOMY  11-11-2004  DR [name] YOUNG   DUCTAL CARCINOMA IN SITU RIGHT BREAST  . TOTAL ABDOMINAL HYSTERECTOMY W/ BILATERAL SALPINGOOPHORECTOMY  1990  . TRANSTHORACIC ECHOCARDIOGRAM  03-30-2008  DR MARGARET SIMPSON   LV SIZE AND FUNCTION NORMAL/ MODERATE AORTIC ARCH DILATATION/ MILD MR    Current Medications: Outpatient Medications Prior to Visit    Medication Sig Dispense Refill  . acetaminophen (TYLENOL) 650 MG CR tablet Take 650 mg by mouth every 8 (eight) hours as needed for pain.    Marland Kitchen aspirin EC 325 MG tablet Take 1 tablet (325 mg total) by mouth daily. 100 tablet 3  . betamethasone dipropionate (DIPROLENE) 0.05 % cream Apply topically 2 (two) times daily. Apply sparingly twice daily to affected area for 5 days, then as needed 45 g 0  . calcium-vitamin D (OSCAL 500/200 D-3) 500-200 MG-UNIT per tablet Take 1 tablet by mouth 2 (two) times daily.     . carvedilol (COREG) 3.125 MG tablet TAKE 1 TABLET BY MOUTH TWICE DAILY WITH A MEAL 180 tablet 0  . cholecalciferol (VITAMIN D) 1000 UNITS tablet Take 1,000 Units by mouth daily.    Marland Kitchen gabapentin (NEURONTIN) 100 MG capsule Two capsules at bedtime at bedtime 180 capsule 3  . lovastatin (MEVACOR) 40 MG tablet Take 1 tablet (40 mg total) by mouth daily with breakfast. (Patient taking differently: Take 40 mg by mouth daily. ) 90 tablet 1  . montelukast (SINGULAIR) 10 MG tablet Take 1 tablet (10 mg total) by mouth at bedtime. 90 tablet 1  . Multiple Vitamins-Minerals (MULTIVITAMINS THER. W/MINERALS) TABS tablet Take 1 tablet by mouth daily.    . solifenacin (VESICARE) 10 MG tablet Take 10 mg by mouth daily.    . hydrochlorothiazide (HYDRODIURIL) 25 MG tablet TAKE ONE TABLET BY MOUTH ONCE DAILY IN THE MORNING 90 tablet 1  . losartan (COZAAR) 50 MG tablet TAKE ONE TABLET BY MOUTH ONCE DAILY 90 tablet 3   No facility-administered medications prior to visit.      Allergies:   Codeine   Social History   Social History  . Marital status: Divorced    Spouse name: N/A  . Number of children: 4  . Years of education: N/A   Social History Main Topics  . Smoking status: Never Smoker  . Smokeless tobacco: Never Used  . Alcohol use No  . Drug use: No  . Sexual activity: Not Currently   Other Topics Concern  . None   Social History Narrative   RETIRED FROM [employer]. 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      Family History:  The patient's family history includes Cancer in her sister; Cancer (age of onset: 74) in her father; Heart attack (age of onset: 59) in her mother; Hypertension in her brother and mother; Kidney failure in her mother; Prostate cancer in her father; Throat cancer in her father.   ROS:   Please see the history of present illness.    ROS All other systems reviewed and are negative.   PHYSICAL EXAM:   VS:  BP 132/78 (BP Location: Left Arm, Patient Position: Sitting, Cuff Size: Normal)   Pulse (!) 59   Ht 5' 1.5" (1.562 m)   Wt 84 kg (185 lb 3.2 oz)   BMI 34.43 kg/m    GEN: Well nourished, well developed, in no acute distress  HEENT: normal  Neck: no JVD, carotid bruits, or masses Cardiac: RRR; no murmurs, rubs, or gallops,no edema  Respiratory:  clear to auscultation  bilaterally, normal work of breathing GI: soft, nontender, nondistended, + BS MS: no deformity or atrophy  Skin: warm and dry, no rash Neuro:  Alert and Oriented x 3, Strength and sensation are intact Psych: euthymic mood, full affect  Wt Readings from Last 3 Encounters:  09/30/16 84 kg (185 lb 3.2 oz)  08/05/16 82.6 kg (182 lb 0.6 oz)  07/31/16 83 kg (183 lb)      Studies/Labs Reviewed:   EKG:  EKG is ordered today.  The ekg ordered today demonstrates sinus bradycardia, normal tracing otherwise. QTC 413 ms  Recent Labs: 07/02/2016: ALT 14; BUN 20; Creat 0.98; Potassium 3.7; Sodium 140; TSH 2.56   Lipid Panel    Component Value Date/Time   CHOL 171 07/02/2016 0709   TRIG 51 07/02/2016 0709   HDL 89 07/02/2016 0709   CHOLHDL 1.9 07/02/2016 0709   VLDL 10 07/02/2016 0709   LDLCALC 72 07/02/2016 0709    Additional studies/ records that were reviewed today include:  Notes from Dr. Servando Snare    ASSESSMENT:    1. Thoracic aortic aneurysm without rupture ([place])   2. Essential hypertension   3. Obesity (BMI 30-39.9)   4. CKD (chronic kidney disease) stage 3, GFR 30-59 ml/min   5. Pure  hypercholesterolemia      PLAN:  In order of problems listed above:  1. Asc Ao aneurysm: Stable in size and asymptomatic. Her occasional chest discomfort does not sound compatible with either coronary etiology or an aneurysm. Okay to engage in regular moderate physical exercise that does not involve isometric straining. 2. HTN: Well controlled 3. Obesity: Recommend weight loss 4. CKD: Dose of ARBand diuretic recently decreased. F/U Dr. Lowanda Foster. 5. HLP: LDL is close to target 6. 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 This is not heard on her exam today. It is likely to recur when she has hypovolemia or a hyperadrenergic state    Medication Adjustments/Labs and Tests Ordered: Current medicines are reviewed at length with the patient today.  Concerns regarding medicines are outlined above.  Medication changes, Labs and Tests ordered today are listed in the Patient Instructions below. Patient Instructions  Medication Instructions: Your physician recommends that you continue on your current medications as directed. Please refer to the Current Medication list given to you today.   Follow-Up: Your physician wants you to follow-up in: 12 months with Dr. [name]. You will receive a reminder letter in the mail two months in advance. If you don't receive a letter, please call our office to schedule the follow-up appointment.  If you need a refill on your cardiac medications before your next appointment, please call your pharmacy.     Signed, [name], MD  10/01/2016 3:44 PM    [facility] [place], [place], [place]  [address] Phone: [phone]; Fax: [phone]

## 2016-09-30 NOTE — Patient Instructions (Signed)
Medication Instructions: Your physician recommends that you continue on your current medications as directed. Please refer to the Current Medication list given to you today.   Follow-Up: Your physician wants you to follow-up in: 12 months with Dr. Croitoru. You will receive a reminder letter in the mail two months in advance. If you don't receive a letter, please call our office to schedule the follow-up appointment.  If you need a refill on your cardiac medications before your next appointment, please call your pharmacy.  

## 2016-10-01 DIAGNOSIS — N183 Chronic kidney disease, stage 3 unspecified: Secondary | ICD-10-CM | POA: Insufficient documentation

## 2016-11-15 DIAGNOSIS — I10 Essential (primary) hypertension: Secondary | ICD-10-CM | POA: Diagnosis not present

## 2016-11-15 DIAGNOSIS — N183 Chronic kidney disease, stage 3 (moderate): Secondary | ICD-10-CM | POA: Diagnosis not present

## 2016-11-15 DIAGNOSIS — E559 Vitamin D deficiency, unspecified: Secondary | ICD-10-CM | POA: Diagnosis not present

## 2016-11-15 DIAGNOSIS — D509 Iron deficiency anemia, unspecified: Secondary | ICD-10-CM | POA: Diagnosis not present

## 2016-11-19 DIAGNOSIS — I10 Essential (primary) hypertension: Secondary | ICD-10-CM | POA: Diagnosis not present

## 2016-11-19 DIAGNOSIS — N183 Chronic kidney disease, stage 3 (moderate): Secondary | ICD-10-CM | POA: Diagnosis not present

## 2016-11-19 DIAGNOSIS — N250 Renal osteodystrophy: Secondary | ICD-10-CM | POA: Diagnosis not present

## 2016-11-19 DIAGNOSIS — R809 Proteinuria, unspecified: Secondary | ICD-10-CM | POA: Diagnosis not present

## 2016-11-30 ENCOUNTER — Other Ambulatory Visit: Payer: Self-pay | Admitting: Cardiovascular Disease

## 2016-12-06 DIAGNOSIS — I10 Essential (primary) hypertension: Secondary | ICD-10-CM | POA: Diagnosis not present

## 2016-12-06 DIAGNOSIS — E559 Vitamin D deficiency, unspecified: Secondary | ICD-10-CM | POA: Diagnosis not present

## 2016-12-06 DIAGNOSIS — D649 Anemia, unspecified: Secondary | ICD-10-CM | POA: Diagnosis not present

## 2016-12-06 DIAGNOSIS — E785 Hyperlipidemia, unspecified: Secondary | ICD-10-CM | POA: Diagnosis not present

## 2016-12-06 LAB — COMPREHENSIVE METABOLIC PANEL
ALBUMIN: 3.6 g/dL (ref 3.6–5.1)
ALK PHOS: 67 U/L (ref 33–130)
ALT: 14 U/L (ref 6–29)
AST: 27 U/L (ref 10–35)
BILIRUBIN TOTAL: 0.6 mg/dL (ref 0.2–1.2)
BUN: 14 mg/dL (ref 7–25)
CHLORIDE: 103 mmol/L (ref 98–110)
CO2: 29 mmol/L (ref 20–31)
Calcium: 9.1 mg/dL (ref 8.6–10.4)
Creat: 1.12 mg/dL — ABNORMAL HIGH (ref 0.60–0.93)
Glucose, Bld: 96 mg/dL (ref 65–99)
Potassium: 4.1 mmol/L (ref 3.5–5.3)
SODIUM: 141 mmol/L (ref 135–146)
TOTAL PROTEIN: 6.7 g/dL (ref 6.1–8.1)

## 2016-12-06 LAB — LIPID PANEL
Cholesterol: 142 mg/dL (ref <200)
HDL: 87 mg/dL (ref >50)
LDL Cholesterol: 48 mg/dL (ref <100)
Total CHOL/HDL Ratio: 1.6 Ratio (ref <5.0)
Triglycerides: 36 mg/dL (ref <150)
VLDL: 7 mg/dL (ref <30)

## 2016-12-06 LAB — FERRITIN: FERRITIN: 162 ng/mL (ref 20–288)

## 2016-12-06 LAB — VITAMIN B12: Vitamin B-12: >2000 pg/mL — ABNORMAL HIGH (ref 200–1100)

## 2016-12-06 LAB — CBC
HCT: 33.6 % — ABNORMAL LOW (ref 35.0–45.0)
Hemoglobin: 11.1 g/dL — ABNORMAL LOW (ref 11.7–15.5)
MCH: 28.7 pg (ref 27.0–33.0)
MCHC: 33 g/dL (ref 32.0–36.0)
MCV: 86.8 fL (ref 80.0–100.0)
MPV: 8.9 fL (ref 7.5–12.5)
Platelets: 307 10*3/uL (ref 140–400)
RBC: 3.87 MIL/uL (ref 3.80–5.10)
RDW: 14.4 % (ref 11.0–15.0)
WBC: 2.9 10*3/uL — AB (ref 3.8–10.8)

## 2016-12-06 LAB — IRON: Iron: 66 ug/dL (ref 45–160)

## 2016-12-08 ENCOUNTER — Ambulatory Visit: Payer: Self-pay | Admitting: Family Medicine

## 2016-12-08 ENCOUNTER — Encounter: Payer: Self-pay | Admitting: Family Medicine

## 2016-12-08 LAB — VITAMIN D 25 HYDROXY (VIT D DEFICIENCY, FRACTURES): VIT D 25 HYDROXY: 33 ng/mL (ref 30–100)

## 2016-12-24 ENCOUNTER — Other Ambulatory Visit: Payer: Self-pay | Admitting: Family Medicine

## 2016-12-24 ENCOUNTER — Telehealth: Payer: Self-pay | Admitting: Family Medicine

## 2016-12-24 MED ORDER — MECLIZINE HCL 12.5 MG PO TABS: 12.5000 mg | ORAL_TABLET | Freq: Three times a day (TID) | ORAL | 0 refills | Status: DC | PRN

## 2016-12-24 NOTE — Telephone Encounter (Signed)
Medication, antivert sent in  , I spoke with her, she is aware

## 2016-12-24 NOTE — Telephone Encounter (Signed)
Ear ache last week leading to dizziness since last Friday, requesting an appt or someone to tell her what she can take to help her.  cb#: [phone]

## 2016-12-24 NOTE — Telephone Encounter (Signed)
Requesting something be called in for the dizziness. Reports she also had bouts of vertigo years ago and thinks the med will help her. Wants sent to [facility]

## 2017-01-13 ENCOUNTER — Other Ambulatory Visit: Payer: Self-pay | Admitting: Family Medicine

## 2017-01-13 DIAGNOSIS — Z1231 Encounter for screening mammogram for malignant neoplasm of breast: Secondary | ICD-10-CM

## 2017-01-14 ENCOUNTER — Encounter: Payer: Self-pay | Admitting: Family Medicine

## 2017-01-14 ENCOUNTER — Ambulatory Visit (INDEPENDENT_AMBULATORY_CARE_PROVIDER_SITE_OTHER): Payer: PPO | Admitting: Family Medicine

## 2017-01-14 VITALS — BP 128/76 | HR 72 | Temp 98.0°F | Resp 16 | Ht 62.0 in | Wt 179.1 lb

## 2017-01-14 DIAGNOSIS — E785 Hyperlipidemia, unspecified: Secondary | ICD-10-CM

## 2017-01-14 DIAGNOSIS — D72819 Decreased white blood cell count, unspecified: Secondary | ICD-10-CM

## 2017-01-14 DIAGNOSIS — I10 Essential (primary) hypertension: Secondary | ICD-10-CM

## 2017-01-14 DIAGNOSIS — E669 Obesity, unspecified: Secondary | ICD-10-CM

## 2017-01-14 DIAGNOSIS — D649 Anemia, unspecified: Secondary | ICD-10-CM

## 2017-01-14 MED ORDER — PREDNISONE 5 MG PO TABS: 5.0000 mg | ORAL_TABLET | Freq: Two times a day (BID) | ORAL | 0 refills | Status: AC

## 2017-01-14 NOTE — Assessment & Plan Note (Signed)
Hyperlipidemia:Low fat diet discussed and encouraged.   Lipid Panel  Lab Results  Component Value Date   CHOL 142 12/06/2016   HDL 87 12/06/2016   LDLCALC 48 12/06/2016   TRIG 36 12/06/2016   CHOLHDL 1.6 12/06/2016   Controlled, no change in medication

## 2017-01-14 NOTE — Assessment & Plan Note (Signed)
Controlled, no change in medication DASH diet and commitment to daily physical activity for a minimum of 30 minutes discussed and encouraged, as a part of hypertension management. The importance of attaining a healthy weight is also discussed.  BP/Weight 01/14/2017 09/30/2016 08/05/2016 07/31/2016 07/08/2016 05/14/2016 04/27/8332  Systolic BP 832 919 166 060 045 997 741  Diastolic BP 76 78 78 73 78 80 68  Wt. (Lbs) 179.12 185.2 182.04 183 179 184 181.08  BMI 32.76 34.43 34.4 34.58 32.74 34.2 33.66

## 2017-01-14 NOTE — Patient Instructions (Signed)
Physical exam in October, call if you need me before  You are referred to hematologist as we discussed , you should get appointment info within next 2 weeks , call back if not    Stop multivitamin since vit B level is very high  It is important that you exercise regularly at least 30 minutes 5 times a week. If you develop chest pain, have severe difficulty breathing, or feel very tired, stop exercising immediately and seek medical attention   %0% of lunch and dinner needs to be vegetable and fruit  Thank you  for choosing [facility]. We consider it a privelige to serve you.  Delivering excellent health care in a caring and  compassionate way is our goal.  Partnering with you,  so that together we can achieve this goal is our strategy.

## 2017-01-14 NOTE — Progress Notes (Signed)
Desiree [name]     MRN: [id number]      DOB: [birth year]   HPI Desiree [name] is here for follow up and re-evaluation of chronic medical conditions, medication management and review of any available recent lab and radiology data.  Preventive health is updated, specifically  Cancer screening and Immunization.   Questions or concerns regarding consultations or procedures which the PT has had in the interim are  addressed. The PT denies any adverse reactions to current medications since the last visit.  Has had flare up of allergies which is cleared with claritin and singulair Intermittent RUE pain and numbness established cervical spondylosis, will send in prednisone x 5 days  for as needed use one time only ROS Denies recent fever or chills. Denies sinus pressure, nasal congestion, ear pain or sore throat. Denies chest congestion, productive cough or wheezing. Denies chest pains, palpitations and leg swelling Denies abdominal pain, nausea, vomiting,diarrhea or constipation.   Denies dysuria, frequency, hesitancy or incontinence. Denies joint pain, swelling and limitation in mobility. Denies headaches, seizures, numbness, or tingling. Denies depression, anxiety or insomnia. Denies skin break down or rash.   PE  BP 128/76 (BP Location: Left Arm, Patient Position: Sitting, Cuff Size: Normal)   Pulse 72   Temp 98 F (36.7 C) (Temporal)   Resp 16   Ht 5\' 2"  (1.575 m)   Wt 179 lb 1.9 oz (81.2 kg)   SpO2 98%   BMI 32.76 kg/m   Patient alert and oriented and in no cardiopulmonary distress.  HEENT: No facial asymmetry, EOMI,   oropharynx pink and moist.  Neck supple no JVD, no mass.  Chest: Clear to auscultation bilaterally.  CVS: S1, S2 no murmurs, no S3.Regular rate.  ABD: Soft non tender.   Ext: No edema  MS: Adequate ROM spine, shoulders, hips and knees.  Skin: Intact, no ulcerations or rash noted.  Psych: Good eye contact, normal affect. Memory intact not anxious or  depressed appearing.  CNS: CN 2-12 intact, power,  normal throughout.no focal deficits noted.   Assessment & Plan  Anemia Refer to hematology, Hb 11.1, normal iron and ferritin, elevated B12   Leukopenia Refer to hematology, WBC is 2.9  Essential hypertension Controlled, no change in medication DASH diet and commitment to daily physical activity for a minimum of 30 minutes discussed and encouraged, as a part of hypertension management. The importance of attaining a healthy weight is also discussed.  BP/Weight 01/14/2017 09/30/2016 08/05/2016 07/31/2016 07/08/2016 05/14/2016 2/99/2426  Systolic BP 834 196 222 979 892 119 417  Diastolic BP 76 78 78 73 78 80 68  Wt. (Lbs) 179.12 185.2 182.04 183 179 184 181.08  BMI 32.76 34.43 34.4 34.58 32.74 34.2 33.66       Hyperlipidemia LDL goal <100 Hyperlipidemia:Low fat diet discussed and encouraged.   Lipid Panel  Lab Results  Component Value Date   CHOL 142 12/06/2016   HDL 87 12/06/2016   LDLCALC 48 12/06/2016   TRIG 36 12/06/2016   CHOLHDL 1.6 12/06/2016   Controlled, no change in medication     Obesity (BMI 30-39.9) Improved Patient re-educated about  the importance of commitment to a  minimum of 150 minutes of exercise per week.  The importance of healthy food choices with portion control discussed. Encouraged to start a food diary, count calories and to consider  joining a support group. Sample diet sheets offered. Goals set by the patient for the next several months.   Weight /BMI 01/14/2017  09/30/2016 08/05/2016  WEIGHT 179 lb 1.9 oz 185 lb 3.2 oz 182 lb 0.6 oz  HEIGHT 5\' 2"  5' 1.5" 5\' 1"   BMI 32.76 kg/m2 34.43 kg/m2 34.4 kg/m2

## 2017-01-14 NOTE — Assessment & Plan Note (Signed)
Improved Patient re-educated about  the importance of commitment to a  minimum of 150 minutes of exercise per week.  The importance of healthy food choices with portion control discussed. Encouraged to start a food diary, count calories and to consider  joining a support group. Sample diet sheets offered. Goals set by the patient for the next several months.   Weight /BMI 01/14/2017 09/30/2016 08/05/2016  WEIGHT 179 lb 1.9 oz 185 lb 3.2 oz 182 lb 0.6 oz  HEIGHT 5\' 2"  5' 1.5" 5\' 1"   BMI 32.76 kg/m2 34.43 kg/m2 34.4 kg/m2

## 2017-01-14 NOTE — Assessment & Plan Note (Addendum)
Refer to hematology, Hb 11.1, normal iron and ferritin, elevated B12

## 2017-01-14 NOTE — Assessment & Plan Note (Addendum)
Refer to hematology, WBC is 2.9

## 2017-01-15 ENCOUNTER — Ambulatory Visit (HOSPITAL_COMMUNITY)
Admission: RE | Admit: 2017-01-15 | Discharge: 2017-01-15 | Disposition: A | Discharge status: Home / Self Care | Payer: PPO | Source: Ambulatory Visit | Attending: Family Medicine | Admitting: Family Medicine

## 2017-01-15 DIAGNOSIS — Z1231 Encounter for screening mammogram for malignant neoplasm of breast: Secondary | ICD-10-CM | POA: Diagnosis not present

## 2017-01-22 ENCOUNTER — Other Ambulatory Visit: Payer: Self-pay | Admitting: Family Medicine

## 2017-02-04 DIAGNOSIS — N3946 Mixed incontinence: Secondary | ICD-10-CM | POA: Diagnosis not present

## 2017-02-12 ENCOUNTER — Encounter (HOSPITAL_COMMUNITY): Payer: PPO | Attending: Oncology | Admitting: Oncology

## 2017-02-12 ENCOUNTER — Encounter (HOSPITAL_COMMUNITY): Payer: PPO

## 2017-02-12 ENCOUNTER — Encounter (HOSPITAL_COMMUNITY): Payer: Self-pay

## 2017-02-12 DIAGNOSIS — Z853 Personal history of malignant neoplasm of breast: Secondary | ICD-10-CM | POA: Insufficient documentation

## 2017-02-12 DIAGNOSIS — Z79899 Other long term (current) drug therapy: Secondary | ICD-10-CM | POA: Diagnosis not present

## 2017-02-12 DIAGNOSIS — D708 Other neutropenia: Secondary | ICD-10-CM

## 2017-02-12 DIAGNOSIS — D649 Anemia, unspecified: Secondary | ICD-10-CM | POA: Diagnosis not present

## 2017-02-12 DIAGNOSIS — K219 Gastro-esophageal reflux disease without esophagitis: Secondary | ICD-10-CM | POA: Diagnosis not present

## 2017-02-12 DIAGNOSIS — I10 Essential (primary) hypertension: Secondary | ICD-10-CM | POA: Insufficient documentation

## 2017-02-12 DIAGNOSIS — M5030 Other cervical disc degeneration, unspecified cervical region: Secondary | ICD-10-CM | POA: Diagnosis not present

## 2017-02-12 DIAGNOSIS — E785 Hyperlipidemia, unspecified: Secondary | ICD-10-CM | POA: Insufficient documentation

## 2017-02-12 DIAGNOSIS — Z8673 Personal history of transient ischemic attack (TIA), and cerebral infarction without residual deficits: Secondary | ICD-10-CM | POA: Diagnosis not present

## 2017-02-12 DIAGNOSIS — D72819 Decreased white blood cell count, unspecified: Secondary | ICD-10-CM | POA: Insufficient documentation

## 2017-02-12 DIAGNOSIS — I712 Thoracic aortic aneurysm, without rupture: Secondary | ICD-10-CM | POA: Insufficient documentation

## 2017-02-12 DIAGNOSIS — Z7982 Long term (current) use of aspirin: Secondary | ICD-10-CM | POA: Insufficient documentation

## 2017-02-12 DIAGNOSIS — Z885 Allergy status to narcotic agent status: Secondary | ICD-10-CM | POA: Diagnosis not present

## 2017-02-12 LAB — CBC WITH DIFFERENTIAL/PLATELET
BASOS ABS: 0 10*3/uL (ref 0.0–0.1)
Basophils Relative: 1 %
Eosinophils Absolute: 0.1 10*3/uL (ref 0.0–0.7)
Eosinophils Relative: 3 %
HEMATOCRIT: 34 % — AB (ref 36.0–46.0)
HEMOGLOBIN: 11.2 g/dL — AB (ref 12.0–15.0)
LYMPHS ABS: 1.1 10*3/uL (ref 0.7–4.0)
LYMPHS PCT: 29 %
MCH: 29 pg (ref 26.0–34.0)
MCHC: 32.9 g/dL (ref 30.0–36.0)
MCV: 88.1 fL (ref 78.0–100.0)
Monocytes Absolute: 0.4 10*3/uL (ref 0.1–1.0)
Monocytes Relative: 9 %
NEUTROS ABS: 2.3 10*3/uL (ref 1.7–7.7)
NEUTROS PCT: 58 %
PLATELETS: 252 10*3/uL (ref 150–400)
RBC: 3.86 MIL/uL — AB (ref 3.87–5.11)
RDW: 14.9 % (ref 11.5–15.5)
WBC: 3.9 10*3/uL — AB (ref 4.0–10.5)

## 2017-02-12 LAB — IRON AND TIBC
IRON: 65 ug/dL (ref 28–170)
Saturation Ratios: 18 % (ref 10.4–31.8)
TIBC: 360 ug/dL (ref 250–450)
UIBC: 295 ug/dL

## 2017-02-12 LAB — COMPREHENSIVE METABOLIC PANEL
ALK PHOS: 67 U/L (ref 38–126)
ALT: 16 U/L (ref 14–54)
AST: 27 U/L (ref 15–41)
Albumin: 3.8 g/dL (ref 3.5–5.0)
Anion gap: 7 (ref 5–15)
BUN: 19 mg/dL (ref 6–20)
CALCIUM: 9.3 mg/dL (ref 8.9–10.3)
CHLORIDE: 101 mmol/L (ref 101–111)
CO2: 31 mmol/L (ref 22–32)
CREATININE: 1.04 mg/dL — AB (ref 0.44–1.00)
GFR calc Af Amer: >60 mL/min (ref >60)
GFR, EST NON AFRICAN AMERICAN: 53 mL/min — AB (ref >60)
Glucose, Bld: 91 mg/dL (ref 65–99)
Potassium: 3.9 mmol/L (ref 3.5–5.1)
Sodium: 139 mmol/L (ref 135–145)
Total Bilirubin: 0.7 mg/dL (ref 0.3–1.2)
Total Protein: 7.4 g/dL (ref 6.5–8.1)

## 2017-02-12 LAB — VITAMIN B12: VITAMIN B 12: 2369 pg/mL — AB (ref 180–914)

## 2017-02-12 LAB — RETICULOCYTES
RBC.: 3.86 MIL/uL — AB (ref 3.87–5.11)
RETIC CT PCT: 1.9 % (ref 0.4–3.1)
Retic Count, Absolute: 73.3 10*3/uL (ref 19.0–186.0)

## 2017-02-12 LAB — FOLATE: Folate: 21.1 ng/mL (ref >5.9)

## 2017-02-12 LAB — FERRITIN: FERRITIN: 114 ng/mL (ref 11–307)

## 2017-02-12 NOTE — Patient Instructions (Signed)
[facility] at [facility] Discharge Instructions  RECOMMENDATIONS MADE BY THE CONSULTANT AND ANY TEST RESULTS WILL BE SENT TO YOUR REFERRING PHYSICIAN.  You saw Dr. Zhou today.  Thank you for choosing [facility] at [facility] to provide your oncology and hematology care.  To afford each patient quality time with our provider, please arrive at least 15 minutes before your scheduled appointment time.    If you have a lab appointment with the Cancer Center please come in thru the  Main Entrance and check in at the main information desk  You need to re-schedule your appointment should you arrive 10 or more minutes late.  We strive to give you quality time with our providers, and arriving late affects you and other patients whose appointments are after yours.  Also, if you no show three or more times for appointments you may be dismissed from the clinic at the providers discretion.     Again, thank you for choosing [facility].  Our hope is that these requests will decrease the amount of time that you wait before being seen by our physicians.       _____________________________________________________________  Should you have questions after your visit to  Cancer Center, please contact our office at [phone] between the hours of 8:30 a.m. and 4:30 p.m.  Voicemails left after 4:30 p.m. will not be returned until the following business day.  For prescription refill requests, have your pharmacy contact our office.       Resources For Cancer Patients and their Caregivers ? American Cancer Society: Can assist with transportation, wigs, general needs, runs Look Good Feel Better.        1-888-227-6333 ? Cancer Care: Provides financial assistance, online support groups, medication/co-pay assistance.  1-800-813-HOPE (4673) ? [facility] Assists [place] cancer patients and their families through  emotional , educational and financial support.  336-427-4357 ? [place] DSS Where to apply for food stamps, Medicaid and utility assistance. 336-342-1394 ? [facility]: Transportation to medical appointments. 336-347-2287 ? Social Security Administration: May apply for disability if have a Stage IV cancer. 336-342-7796 1-800-772-1213 ? [place] Aging, Disability and Transit Services: Assists with nutrition, care and transit needs. 336-349-2343  Cancer Center Support Programs: @10RELATIVEDAYS@ > Cancer Support Group  2nd Tuesday of the month 1pm-2pm, Journey Room  > Creative Journey  3rd Tuesday of the month 1130am-1pm, Journey Room  > Look Good Feel Better  1st Wednesday of the month 10am-12 noon, Journey Room (Call American Cancer Society to register 1-800-395-5775)    

## 2017-02-12 NOTE — Progress Notes (Signed)
[facility] Initial Visit:  Patient Care Team: [name], MD as PCP - General Grace Isaac, MD as Consulting Physician (Cardiothoracic Surgery) Croitoru, Dani Gobble, MD as Consulting Physician (Cardiology) Hortencia Pilar, MD as Consulting Physician (Ophthalmology) Carolan Clines, MD as Consulting Physician (Urology)  CHIEF COMPLAINTS/PURPOSE OF CONSULTATION:  Leukopenia  HISTORY OF PRESENTING ILLNESS: Desiree [name] 71 y.o. female is here because of leukopenia. Patient had blood work performed on 12/06/16 which demonstrated WBC 2.9K, hemoglobin 11.1 g/dL, hematocrit 33.6%, MCV 86.8, platelet count 307K. Review of her CBCs from the past 3 years demonstrated that her WBC ranges between 2.9K and 4.1K. Patient denies getting frequent or recurrent infections. She denies any chest pain, shortness breath, abdominal pain, focal weakness. She states that she does have chronic fatigue. No risk factors for HIV or hepatitis. She denies any night sweats, unexplained weight loss, unexplained fevers or chills.  Review of Systems - Oncology  MEDICAL HISTORY: Past Medical History:  Diagnosis Date  . Allergic rhinitis   . Arthritis HIPS/ WRISTS  . DDD (degenerative disc disease), cervical   . GERD (gastroesophageal reflux disease) OCCASIONAL  . Heart murmur ASYMPTOMATIC  . History of Bell's palsy 2011  RIGHT SIDE -- RESOLVED  . 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 History of CVA (cerebrovascular accident) PER SCAN IN 2011  . Hyperlipidemia   . Hypertension   . Impaired fasting glucose PER PCP  DR SIMPSON   WATCH DIET  . Mixed urge and stress incontinence   . Nocturia   . Sinus drainage   . Thoracic ascending aortic aneurysm ([place]) LAST CHEST CT 09-02-2010--  FOLLOWED BY  CARDIOLOGIST--  DR [id number]  . Vaginal wall prolapse     SURGICAL HISTORY: Past Surgical History:   Procedure Laterality Date  . BILATERAL CARPAL TUNNEL RELEASE  1980'S  . CARDIAC CATHETERIZATION  02-01-2009  DR EICHHORN   NORMAL CORONARY ARTERIES/ NORMAL LV SIZE AND FUNCTION/ AORTIC ROOT SIZE AT THE UPPER LIMIT OF NORMAL   . CARPAL TUNNEL RELEASE    . CERVICAL DISC SURGERY  12-12-2005  DR Vertell Limber   LEFT  C6 - C7 HERINATED / DDD/ SPONDYLOSIS  . COLONOSCOPY N/A 02/10/2014   Procedure: COLONOSCOPY;  Surgeon: Danie Binder, MD;  [facility];  Service: Endoscopy;  Laterality: N/A;  8:30  . CYSTOCELE REPAIR  08/02/2012   Procedure: ANTERIOR REPAIR (CYSTOCELE);  Surgeon: Ailene Rud, MD;  Location: [facility];  Service: Urology;  Laterality: N/A;  [employer] Uphold Anterior Pelvic Floor Sacrospinus Repair. Anterior wall of the vagina.  . ESOPHAGOGASTRODUODENOSCOPY N/A 02/10/2014   Procedure: ESOPHAGOGASTRODUODENOSCOPY (EGD);  Surgeon: Danie Binder, MD;  Location: [facility];  Service: Endoscopy;  Laterality: N/A;  . GIVENS CAPSULE STUDY N/A 03/13/2014   Procedure: GIVENS CAPSULE STUDY;  Surgeon: Danie Binder, MD;  Location: [facility];  Service: Endoscopy;  Laterality: N/A;  7:30  . NECK SURGERY  2007  . RIGHT PARTIAL MASTECTOMY  11-11-2004  DR [name] YOUNG   DUCTAL CARCINOMA IN SITU RIGHT BREAST  . TOTAL ABDOMINAL HYSTERECTOMY W/ BILATERAL SALPINGOOPHORECTOMY  1990  . TRANSTHORACIC ECHOCARDIOGRAM  03-30-2008  DR MARGARET SIMPSON   LV SIZE AND FUNCTION NORMAL/ MODERATE AORTIC ARCH DILATATION/ MILD MR    SOCIAL HISTORY: Social History   Social History  . Marital status: Divorced    Spouse name: N/A  .  Number of children: 4  . Years of education: N/A   Occupational History  . Not on file.   Social History Main Topics  . Smoking status: Never Smoker  . Smokeless tobacco: Never Used  . Alcohol use No  . Drug use: No  . Sexual activity: Not Currently   Other Topics Concern  . Not on file   Social History Narrative   RETIRED FROM  [employer]. 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    FAMILY HISTORY Family History  Problem Relation Age of Onset  . Kidney failure Mother   . Hypertension Mother   . Heart attack Mother 47  . Throat cancer Father   . Prostate cancer Father   . Cancer Father 83       lung   . Cancer Sister        Breast  . Hypertension Brother   . Colon cancer Neg Hx   . Colon polyps Neg Hx     ALLERGIES:  is allergic to codeine.  MEDICATIONS:  Current Outpatient Prescriptions  Medication Sig Dispense Refill  . acetaminophen (TYLENOL) 650 MG CR tablet Take 650 mg by mouth every 8 (eight) hours as needed for pain.    Marland Kitchen aspirin EC 325 MG tablet Take 1 tablet (325 mg total) by mouth daily. 100 tablet 3  . betamethasone dipropionate (DIPROLENE) 0.05 % cream Apply topically 2 (two) times daily. Apply sparingly twice daily to affected area for 5 days, then as needed 45 g 0  . calcium-vitamin D (OSCAL 500/200 D-3) 500-200 MG-UNIT per tablet Take 1 tablet by mouth 2 (two) times daily.     . carvedilol (COREG) 3.125 MG tablet TAKE 1 TABLET BY MOUTH TWICE DAILY WITH A MEAL 180 tablet 3  . cholecalciferol (VITAMIN D) 1000 UNITS tablet Take 1,000 Units by mouth daily.    Marland Kitchen gabapentin (NEURONTIN) 100 MG capsule Two capsules at bedtime at bedtime 180 capsule 3  . hydrochlorothiazide (HYDRODIURIL) 25 MG tablet Take 12.5 mg by mouth daily.    Marland Kitchen losartan (COZAAR) 50 MG tablet Take 25 mg by mouth daily.    Marland Kitchen lovastatin (MEVACOR) 40 MG tablet TAKE ONE TABLET BY MOUTH ONCE DAILY WITH BREAKFAST 90 tablet 1  . meclizine (ANTIVERT) 12.5 MG tablet Take 1 tablet (12.5 mg total) by mouth 3 (three) times daily as needed for dizziness. 30 tablet 0  . montelukast (SINGULAIR) 10 MG tablet Take 1 tablet (10 mg total) by mouth at bedtime. 90 tablet 1  . solifenacin (VESICARE) 10 MG tablet Take 10 mg by mouth daily.     No current facility-administered medications for this visit.     PHYSICAL EXAMINATION: BP 142/83, HR 79, RR 16, T 98.4,  O2 sat 100%  Physical Exam  Constitutional: She is oriented to person, place, and time and well-developed, well-nourished, and in no distress. No distress.  HENT:  Head: Normocephalic and atraumatic.  Mouth/Throat: No oropharyngeal exudate.  Eyes: Conjunctivae are normal. Pupils are equal, round, and reactive to light. No scleral icterus.  Neck: Normal range of motion. Neck supple. No JVD present.  Cardiovascular: Normal rate, regular rhythm and normal heart sounds.  Exam reveals no gallop and no friction rub.   No murmur heard. Pulmonary/Chest: Breath sounds normal. No respiratory distress. She has no wheezes. She has no rales.  Abdominal: Soft. Bowel sounds are normal. She exhibits no distension. There is no tenderness. There is no guarding.  Musculoskeletal: She exhibits no edema or tenderness.  Lymphadenopathy:  She has no cervical adenopathy.  Neurological: She is alert and oriented to person, place, and time. No cranial nerve deficit.  Skin: Skin is warm and dry. No rash noted. No erythema. No pallor.  Psychiatric: Affect and judgment normal.     LABORATORY DATA: I have personally reviewed the data as listed:  No visits with results within 1 Month(s) from this visit.  Latest known visit with results is:  Office Visit on 07/08/2016  Component Date Value Ref Range Status  . WBC 12/06/2016 2.9* 3.8 - 10.8 K/uL Final  . RBC 12/06/2016 3.87  3.80 - 5.10 MIL/uL Final  . Hemoglobin 12/06/2016 11.1* 11.7 - 15.5 g/dL Final  . HCT 12/06/2016 33.6* 35.0 - 45.0 % Final  . MCV 12/06/2016 86.8  80.0 - 100.0 fL Final  . MCH 12/06/2016 28.7  27.0 - 33.0 pg Final  . MCHC 12/06/2016 33.0  32.0 - 36.0 g/dL Final  . RDW 12/06/2016 14.4  11.0 - 15.0 % Final  . Platelets 12/06/2016 307  140 - 400 K/uL Final  . MPV 12/06/2016 8.9  7.5 - 12.5 fL Final  . Cholesterol 12/06/2016 142  <200 mg/dL Final  . Triglycerides 12/06/2016 36  <150 mg/dL Final  . HDL 12/06/2016 87  >50 mg/dL Final  .  Total CHOL/HDL Ratio 12/06/2016 1.6  <5.0 Ratio Final  . VLDL 12/06/2016 7  <30 mg/dL Final  . LDL Cholesterol 12/06/2016 48  <100 mg/dL Final  . Sodium 12/06/2016 141  135 - 146 mmol/L Final  . Potassium 12/06/2016 4.1  3.5 - 5.3 mmol/L Final  . Chloride 12/06/2016 103  98 - 110 mmol/L Final  . CO2 12/06/2016 29  20 - 31 mmol/L Final  . Glucose, Bld 12/06/2016 96  65 - 99 mg/dL Final  . BUN 12/06/2016 14  7 - 25 mg/dL Final  . Creat 12/06/2016 1.12* 0.60 - 0.93 mg/dL Final   Comment:   For patients > or = 70 years of age: The upper reference limit for Creatinine is approximately 13% higher for people identified as African-American.     . Total Bilirubin 12/06/2016 0.6  0.2 - 1.2 mg/dL Final  . Alkaline Phosphatase 12/06/2016 67  33 - 130 U/L Final  . AST 12/06/2016 27  10 - 35 U/L Final  . ALT 12/06/2016 14  6 - 29 U/L Final  . Total Protein 12/06/2016 6.7  6.1 - 8.1 g/dL Final  . Albumin 12/06/2016 3.6  3.6 - 5.1 g/dL Final  . Calcium 12/06/2016 9.1  8.6 - 10.4 mg/dL Final  . Vit D, 25-Hydroxy 12/06/2016 33  30 - 100 ng/mL Final   Comment: Vitamin D Status           25-OH Vitamin D        Deficiency                <20 ng/mL        Insufficiency         20 - 29 ng/mL        Optimal             > or = 30 ng/mL   For 25-OH Vitamin D testing on patients on D2-supplementation and patients for whom quantitation of D2 and D3 fractions is required, the QuestAssureD 25-OH VIT D, (D2,D3), LC/MS/MS is recommended: order code [id number] (patients > 2 yrs).   . Ferritin 12/06/2016 162  20 - 288 ng/mL Final  . Iron 12/06/2016 66  45 -  160 ug/dL Final  . 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 2. Mild normocytic anemia  PLAN: I will perform a full  workup for her leukopenia and anemia with labs, as stated below, today. RTC in 2 weeks to review labs.  Orders Placed This Encounter  Procedures  . CBC with Differential    Standing Status:   Future    Standing Expiration Date:   02/12/2018  . Comprehensive metabolic panel    Standing Status:   Future    Standing Expiration Date:   02/12/2018  . Erythropoietin    Standing Status:   Future    Standing Expiration Date:   02/12/2018  . Iron and TIBC    Standing Status:   Future    Standing Expiration Date:   02/12/2018  . Ferritin    Standing Status:   Future    Standing Expiration Date:   02/12/2018  . Vitamin B12    Standing Status:   Future    Standing Expiration Date:   02/12/2018  . Folate    Standing Status:   Future    Standing Expiration Date:   02/12/2018  . Hemoglobinopathy evaluation    Standing Status:   Future    Standing Expiration Date:   02/12/2018  . Reticulocytes    Standing Status:   Future    Standing Expiration Date:   02/12/2018  . Immunofixation electrophoresis    Standing Status:   Future    Standing Expiration Date:   02/12/2018  . Protein electrophoresis, serum    Standing Status:   Future    Standing Expiration Date:   02/12/2018  . HIV antibody (with reflex)  . Hepatitis panel, acute    Standing Status:   Future    Standing Expiration Date:   02/12/2018    All questions were answered. The patient knows to call the clinic with any problems, questions or concerns.  This note was electronically signed.    Twana First, MD  02/12/2017 9:02 AM

## 2017-02-13 LAB — IMMUNOFIXATION ELECTROPHORESIS
IGA: 213 mg/dL (ref 87–352)
IGG (IMMUNOGLOBIN G), SERUM: 1243 mg/dL (ref 700–1600)
IgM, Serum: 28 mg/dL (ref 26–217)
Total Protein ELP: 6.9 g/dL (ref 6.0–8.5)

## 2017-02-13 LAB — HEMOGLOBINOPATHY EVALUATION
HGB VARIANT: 0 %
Hgb A2 Quant: 2 % (ref 1.8–3.2)
Hgb A: 98 % (ref 96.4–98.8)
Hgb C: 0 %
Hgb F Quant: 0 % (ref 0.0–2.0)
Hgb S Quant: 0 %

## 2017-02-13 LAB — PROTEIN ELECTROPHORESIS, SERUM
A/G Ratio: 1.1 (ref 0.7–1.7)
ALPHA-1-GLOBULIN: 0.2 g/dL (ref 0.0–0.4)
ALPHA-2-GLOBULIN: 0.7 g/dL (ref 0.4–1.0)
Albumin ELP: 3.4 g/dL (ref 2.9–4.4)
BETA GLOBULIN: 1 g/dL (ref 0.7–1.3)
GAMMA GLOBULIN: 1.2 g/dL (ref 0.4–1.8)
Globulin, Total: 3.1 g/dL (ref 2.2–3.9)
Total Protein ELP: 6.5 g/dL (ref 6.0–8.5)

## 2017-02-13 LAB — HEPATITIS PANEL, ACUTE
HCV Ab: 0.1 s/co ratio (ref 0.0–0.9)
Hep A IgM: NEGATIVE
Hep B C IgM: NEGATIVE
Hepatitis B Surface Ag: NEGATIVE

## 2017-02-13 LAB — ERYTHROPOIETIN: ERYTHROPOIETIN: 25 m[IU]/mL — AB (ref 2.6–18.5)

## 2017-02-13 LAB — HIV ANTIBODY (ROUTINE TESTING W REFLEX): HIV SCREEN 4TH GENERATION: NONREACTIVE

## 2017-02-19 ENCOUNTER — Telehealth: Payer: Self-pay | Admitting: Family Medicine

## 2017-02-19 NOTE — Telephone Encounter (Signed)
Patient left message on voice mail  02/19/17 9:25am requesting the results of the mammogram that was done on May 24th.  cb  [phone]

## 2017-02-19 NOTE — Telephone Encounter (Signed)
Left message that results requested were normal

## 2017-02-20 ENCOUNTER — Ambulatory Visit (INDEPENDENT_AMBULATORY_CARE_PROVIDER_SITE_OTHER): Payer: PPO | Admitting: Family Medicine

## 2017-02-20 ENCOUNTER — Encounter: Payer: Self-pay | Admitting: Family Medicine

## 2017-02-20 VITALS — BP 112/78 | HR 80 | Temp 97.8°F | Resp 16 | Ht 62.0 in | Wt 183.1 lb

## 2017-02-20 DIAGNOSIS — J029 Acute pharyngitis, unspecified: Secondary | ICD-10-CM

## 2017-02-20 DIAGNOSIS — I712 Thoracic aortic aneurysm, without rupture, unspecified: Secondary | ICD-10-CM

## 2017-02-20 DIAGNOSIS — K219 Gastro-esophageal reflux disease without esophagitis: Secondary | ICD-10-CM | POA: Diagnosis not present

## 2017-02-20 DIAGNOSIS — R079 Chest pain, unspecified: Secondary | ICD-10-CM | POA: Diagnosis not present

## 2017-02-20 LAB — POCT RAPID STREP A (OFFICE): RAPID STREP A SCREEN: NEGATIVE

## 2017-02-20 NOTE — Patient Instructions (Signed)
Try flonase for the allergies Continue the salt water gargles  Take omeprazole for the heartburn Take daily for 2 weeks May in addition use an antacid like mylanta or tums  Call if not better in a couple of days  Go to the ER if your chest pain worsens

## 2017-02-20 NOTE — Progress Notes (Signed)
Chief Complaint  Patient presents with  . Sore Throat    x 1 week   Pleasant patient of Dr. Griffin Dakin, is here today as a walk-in She has had sore throat for about a week, mostly on the right side of her throat. She's had some postnasal drip and ear pain as well. She feels that the glands on the right side of her neck are swollen. When she looked for throat swelling she thought she saw a white patch on her tonsil. She also complains of right-sided chest pain. It's a pressure sensation. It is not related to breathing. He is not related to exertion. It is mild in nature. No radiation. She's never had this before. The chest pain from cervical she has a history of a thoracic aortic aneurysm. She has been warned to report chest pain. She also has GERD and has noticed a bit more heartburn and gas. She is compliant with all of her medications.  Patient Active Problem List   Diagnosis Date Noted  . Leukopenia 01/14/2017  . CKD (chronic kidney disease) stage 3, GFR 30-59 ml/min 10/01/2016  . 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 Urinary incontinence 05/22/2014  . Metabolic syndrome X 46/65/9935  . Osteopenia 12/12/2013  . Anemia 11/29/2013  . Arthritis of knee, right 02/21/2013  . Obesity (BMI 30-39.9) 10/17/2012  . Thoracic aortic aneurysm ([place]) 10/12/2012  . Allergic rhinitis 03/02/2012  . BLADDER PROLAPSE 09/24/2010  . NEOPLASM, MALIGNANT, BREAST, RIGHT 02/04/2008  . Hyperlipidemia LDL goal <100 02/04/2008  . Essential hypertension 02/04/2008    Outpatient Encounter Prescriptions as of 02/20/2017  Medication Sig  . acetaminophen (TYLENOL) 650 MG CR tablet Take 650 mg by mouth every 8 (eight) hours as needed for pain.  Marland Kitchen aspirin EC 325 MG tablet Take 1 tablet (325 mg total) by mouth daily.  . betamethasone dipropionate (DIPROLENE) 0.05 % cream Apply topically 2 (two) times daily. Apply sparingly twice daily to affected area for 5 days, then as needed  .  calcium-vitamin D (OSCAL 500/200 D-3) 500-200 MG-UNIT per tablet Take 1 tablet by mouth 2 (two) times daily.   . carvedilol (COREG) 3.125 MG tablet TAKE 1 TABLET BY MOUTH TWICE DAILY WITH A MEAL  . cholecalciferol (VITAMIN D) 1000 UNITS tablet Take 1,000 Units by mouth daily.  Marland Kitchen gabapentin (NEURONTIN) 100 MG capsule Two capsules at bedtime at bedtime  . hydrochlorothiazide (HYDRODIURIL) 25 MG tablet Take 12.5 mg by mouth daily.  Marland Kitchen losartan (COZAAR) 50 MG tablet Take 25 mg by mouth daily.  Marland Kitchen lovastatin (MEVACOR) 40 MG tablet TAKE ONE TABLET BY MOUTH ONCE DAILY WITH BREAKFAST  . meclizine (ANTIVERT) 12.5 MG tablet Take 1 tablet (12.5 mg total) by mouth 3 (three) times daily as needed for dizziness.  . montelukast (SINGULAIR) 10 MG tablet Take 1 tablet (10 mg total) by mouth at bedtime.  . solifenacin (VESICARE) 10 MG tablet Take 10 mg by mouth daily.   No facility-administered encounter medications on file as of 02/20/2017.     Allergies  Allergen Reactions  . Codeine Other (See Comments)    CONFUSION/ DIZZY    Review of Systems  Constitutional: Negative for activity change, appetite change and unexpected weight change.  HENT: Positive for ear pain, postnasal drip and sore throat. Negative for congestion, dental problem and rhinorrhea.   Eyes: Negative for redness and visual disturbance.  Respiratory: Positive for chest tightness. Negative for cough and shortness of breath.   Cardiovascular: Positive for chest pain.  Negative for palpitations and leg swelling.  Gastrointestinal: Negative for abdominal pain, constipation and diarrhea.  Genitourinary: Negative for difficulty urinating, frequency and menstrual problem.  Musculoskeletal: Negative for arthralgias and back pain.  Neurological: Negative for dizziness and headaches.  Psychiatric/Behavioral: Negative for dysphoric mood and sleep disturbance. The patient is nervous/anxious.        Anxious about chest pain    BP 112/78 (BP  Location: Right Arm, Patient Position: Sitting, Cuff Size: Normal)   Pulse 80   Temp 97.8 F (36.6 C) (Temporal)   Resp 16   Ht 5\' 2"  (1.575 m)   Wt 183 lb 1.9 oz (83.1 kg)   SpO2 99%   BMI 33.49 kg/m   Physical Exam  Constitutional: She is oriented to person, place, and time. She appears well-developed and well-nourished.  HENT:  Head: Normocephalic and atraumatic.  Right Ear: External ear normal.  Left Ear: External ear normal.  Mouth/Throat: Oropharynx is clear and moist.  Eyes: Conjunctivae are normal. Pupils are equal, round, and reactive to light.  Neck: Normal range of motion. Neck supple. No thyromegaly present.  No bruit  Cardiovascular: Normal rate and regular rhythm.   Murmur heard. Short systolic murmur at right upper sternal border  Pulmonary/Chest: Effort normal and breath sounds normal. No respiratory distress. She has no wheezes. She exhibits no tenderness.  Abdominal: Soft. Bowel sounds are normal.  Midepigastric tenderness. No organomegaly. No abdominal bruit.  Musculoskeletal: Normal range of motion. She exhibits no edema.  Lymphadenopathy:    She has no cervical adenopathy.  Neurological: She is alert and oriented to person, place, and time.  Gait normal  Skin: Skin is warm and dry.  Psychiatric: She has a normal mood and affect. Her behavior is normal. Thought content normal.  Nursing note and vitals reviewed.   ASSESSMENT/PLAN:  1. Sore throat Discussed this is likely a virus. No oropharyngeal exudate seen. Symptomatic care recommended - POCT rapid strep A  2. Chest pain, unspecified type I believe her discomfort is from GERD. She is not on a PPI.  3. Gastroesophageal reflux disease without esophagitis By history  4. Thoracic aortic aneurysm without rupture ([place]) By history. I reassured the patient that I do not believe her chest pain is at all related to her aneurysm.   Patient Instructions  Try flonase for the allergies Continue the salt  water gargles  Take omeprazole for the heartburn Take daily for 2 weeks May in addition use an antacid like mylanta or tums  Call if not better in a couple of days  Go to the ER if your chest pain worsens   Raylene Everts, MD

## 2017-02-26 ENCOUNTER — Encounter (HOSPITAL_COMMUNITY): Payer: PPO | Attending: Oncology | Admitting: Oncology

## 2017-02-26 ENCOUNTER — Encounter (HOSPITAL_COMMUNITY): Payer: Self-pay

## 2017-02-26 VITALS — BP 138/87 | HR 56 | Temp 99.1°F | Resp 16 | Wt 186.6 lb

## 2017-02-26 DIAGNOSIS — E785 Hyperlipidemia, unspecified: Secondary | ICD-10-CM | POA: Insufficient documentation

## 2017-02-26 DIAGNOSIS — K219 Gastro-esophageal reflux disease without esophagitis: Secondary | ICD-10-CM | POA: Insufficient documentation

## 2017-02-26 DIAGNOSIS — D708 Other neutropenia: Secondary | ICD-10-CM | POA: Diagnosis not present

## 2017-02-26 DIAGNOSIS — D649 Anemia, unspecified: Secondary | ICD-10-CM | POA: Diagnosis not present

## 2017-02-26 DIAGNOSIS — Z7982 Long term (current) use of aspirin: Secondary | ICD-10-CM | POA: Insufficient documentation

## 2017-02-26 DIAGNOSIS — Z79899 Other long term (current) drug therapy: Secondary | ICD-10-CM | POA: Insufficient documentation

## 2017-02-26 DIAGNOSIS — Z885 Allergy status to narcotic agent status: Secondary | ICD-10-CM | POA: Insufficient documentation

## 2017-02-26 DIAGNOSIS — Z8673 Personal history of transient ischemic attack (TIA), and cerebral infarction without residual deficits: Secondary | ICD-10-CM | POA: Insufficient documentation

## 2017-02-26 DIAGNOSIS — D72819 Decreased white blood cell count, unspecified: Secondary | ICD-10-CM | POA: Insufficient documentation

## 2017-02-26 DIAGNOSIS — I10 Essential (primary) hypertension: Secondary | ICD-10-CM | POA: Insufficient documentation

## 2017-02-26 DIAGNOSIS — M5030 Other cervical disc degeneration, unspecified cervical region: Secondary | ICD-10-CM | POA: Insufficient documentation

## 2017-02-26 DIAGNOSIS — Z853 Personal history of malignant neoplasm of breast: Secondary | ICD-10-CM | POA: Insufficient documentation

## 2017-02-26 DIAGNOSIS — I712 Thoracic aortic aneurysm, without rupture: Secondary | ICD-10-CM | POA: Insufficient documentation

## 2017-02-26 NOTE — Progress Notes (Signed)
[facility] Follow Up Visit:  Patient Care Team: [name], MD as PCP - General Grace Isaac, MD as Consulting Physician (Cardiothoracic Surgery) Croitoru, Dani Gobble, MD as Consulting Physician (Cardiology) Hortencia Pilar, MD as Consulting Physician (Ophthalmology) Carolan Clines, MD as Consulting Physician (Urology)  CHIEF COMPLAINTS/PURPOSE OF CONSULTATION:  Leukopenia  HISTORY OF PRESENTING ILLNESS: Desiree [name] 71 y.o. female is here because of leukopenia. Patient had blood work performed on 12/06/16 which demonstrated WBC 2.9K, hemoglobin 11.1 g/dL, hematocrit 33.6%, MCV 86.8, platelet count 307K. Review of her CBCs from the past 3 years demonstrated that her WBC ranges between 2.9K and 4.1K. Patient denies getting frequent or recurrent infections. She denies any chest pain, shortness breath, abdominal pain, focal weakness. She states that she does have chronic fatigue. No risk factors for HIV or hepatitis. She denies any night sweats, unexplained weight loss, unexplained fevers or chills.  INTERVAL HISTORY: Patient presented today for continued follow-up of her leukopenia and mild anemia. Leukopenia/anemia workup performed on 02/12/17 demonstrated that her WBC went up to 3.9K, ANC 2.3K, HIV, acute hepatitis panel were negative. Folate, vitamin B-12, iron studies were within normal limits. Hemoglobin electrophoresis was negative for hemoglobinopathies. SPEP was negative for monoclonal gammopathy.  Review of Systems  Constitutional: Negative for appetite change, chills, fatigue and fever.  HENT:   Negative for hearing loss, lump/mass, mouth sores, sore throat and tinnitus.   Eyes: Negative for eye problems and icterus.  Respiratory: Negative for chest tightness, cough, hemoptysis, shortness of breath and wheezing.   Cardiovascular: Negative for chest pain, leg swelling and palpitations.  Gastrointestinal: Negative for abdominal distention,  abdominal pain, blood in stool, diarrhea, nausea and vomiting.  Endocrine: Negative.  Negative for hot flashes.  Genitourinary: Negative for difficulty urinating, frequency and hematuria.   Musculoskeletal: Negative for arthralgias and neck pain.  Skin: Negative for itching and rash.  Neurological: Negative for dizziness, headaches and speech difficulty.  Hematological: Negative for adenopathy. Does not bruise/bleed easily.  Psychiatric/Behavioral: Negative for confusion. The patient is not nervous/anxious.     MEDICAL HISTORY: Past Medical History:  Diagnosis Date  . Allergic rhinitis   . Arthritis HIPS/ WRISTS  . DDD (degenerative disc disease), cervical   . GERD (gastroesophageal reflux disease) OCCASIONAL  . Heart murmur ASYMPTOMATIC  . History of Bell's palsy 2011  RIGHT SIDE -- RESOLVED  . 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 History of CVA (cerebrovascular accident) PER SCAN IN 2011  . Hyperlipidemia   . Hypertension   . Impaired fasting glucose PER PCP  DR SIMPSON   WATCH DIET  . Mixed urge and stress incontinence   . Nocturia   . Sinus drainage   . Thoracic ascending aortic aneurysm ([place]) LAST CHEST CT 09-02-2010--  FOLLOWED BY  CARDIOLOGIST--  DR [id number]  . Vaginal wall prolapse     SURGICAL HISTORY: Past Surgical History:  Procedure Laterality Date  . BILATERAL CARPAL TUNNEL RELEASE  1980'S  . CARDIAC CATHETERIZATION  02-01-2009  DR EICHHORN   NORMAL CORONARY ARTERIES/ NORMAL LV SIZE AND FUNCTION/ AORTIC ROOT SIZE AT THE UPPER LIMIT OF NORMAL   . CARPAL TUNNEL RELEASE    . CERVICAL DISC SURGERY  12-12-2005  DR Vertell Limber   LEFT  C6 - C7 HERINATED / DDD/ SPONDYLOSIS  . COLONOSCOPY N/A 02/10/2014   Procedure: COLONOSCOPY;  Surgeon: Danie Binder, MD;  [facility];  Service: Endoscopy;  Laterality: N/A;  8:30  . CYSTOCELE REPAIR  08/02/2012   Procedure: ANTERIOR REPAIR (CYSTOCELE);   Surgeon: Ailene Rud, MD;  Location: [facility];  Service: Urology;  Laterality: N/A;  [employer] Uphold Anterior Pelvic Floor Sacrospinus Repair. Anterior wall of the vagina.  . ESOPHAGOGASTRODUODENOSCOPY N/A 02/10/2014   Procedure: ESOPHAGOGASTRODUODENOSCOPY (EGD);  Surgeon: Danie Binder, MD;  [facility];  Service: Endoscopy;  Laterality: N/A;  . GIVENS CAPSULE STUDY N/A 03/13/2014   Procedure: GIVENS CAPSULE STUDY;  Surgeon: Danie Binder, MD;  [facility];  Service: Endoscopy;  Laterality: N/A;  7:30  . NECK SURGERY  2007  . RIGHT PARTIAL MASTECTOMY  11-11-2004  DR [name] YOUNG   DUCTAL CARCINOMA IN SITU RIGHT BREAST  . TOTAL ABDOMINAL HYSTERECTOMY W/ BILATERAL SALPINGOOPHORECTOMY  1990  . TRANSTHORACIC ECHOCARDIOGRAM  03-30-2008  DR MARGARET SIMPSON   LV SIZE AND FUNCTION NORMAL/ MODERATE AORTIC ARCH DILATATION/ MILD MR    SOCIAL HISTORY: Social History   Social History  . Marital status: Divorced    Spouse name: N/A  . Number of children: 4  . Years of education: N/A   Occupational History  . Not on file.   Social History Main Topics  . Smoking status: Never Smoker  . Smokeless tobacco: Never Used  . Alcohol use No  . Drug use: No  . Sexual activity: Not Currently   Other Topics Concern  . Not on file   Social History Narrative   RETIRED FROM [employer]. 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    FAMILY HISTORY Family History  Problem Relation Age of Onset  . Kidney failure Mother   . Hypertension Mother   . Heart attack Mother 33  . Throat cancer Father   . Prostate cancer Father   . Cancer Father 47       lung   . Cancer Sister        Breast  . Hypertension Brother   . Colon cancer Neg Hx   . Colon polyps Neg Hx     ALLERGIES:  is allergic to codeine.  MEDICATIONS:  Current Outpatient Prescriptions  Medication Sig Dispense Refill  . acetaminophen (TYLENOL) 650 MG CR tablet Take 650 mg by mouth every 8 (eight)  hours as needed for pain.    Marland Kitchen aspirin EC 325 MG tablet Take 1 tablet (325 mg total) by mouth daily. 100 tablet 3  . betamethasone dipropionate (DIPROLENE) 0.05 % cream Apply topically 2 (two) times daily. Apply sparingly twice daily to affected area for 5 days, then as needed 45 g 0  . calcium-vitamin D (OSCAL 500/200 D-3) 500-200 MG-UNIT per tablet Take 1 tablet by mouth 2 (two) times daily.     . carvedilol (COREG) 3.125 MG tablet TAKE 1 TABLET BY MOUTH TWICE DAILY WITH A MEAL 180 tablet 3  . cholecalciferol (VITAMIN D) 1000 UNITS tablet Take 1,000 Units by mouth daily.    Marland Kitchen gabapentin (NEURONTIN) 100 MG capsule Two capsules at bedtime at bedtime 180 capsule 3  . hydrochlorothiazide (HYDRODIURIL) 25 MG tablet Take 12.5 mg by mouth daily.    Marland Kitchen losartan (COZAAR) 50 MG tablet Take 25 mg by mouth daily.    Marland Kitchen lovastatin (MEVACOR) 40 MG tablet TAKE ONE TABLET BY MOUTH ONCE DAILY WITH BREAKFAST 90 tablet 1  . meclizine (ANTIVERT) 12.5 MG tablet Take 1 tablet (12.5 mg total) by mouth 3 (three) times daily as needed for dizziness. [place]  tablet 0  . montelukast (SINGULAIR) 10 MG tablet Take 1 tablet (10 mg total) by mouth at bedtime. 90 tablet 1  . solifenacin (VESICARE) 10 MG tablet Take 10 mg by mouth daily.     No current facility-administered medications for this visit.     PHYSICAL EXAMINATION: BP 142/83, HR 79, RR 16, T 98.4, O2 sat 100%  Physical Exam  Constitutional: She is oriented to person, place, and time and well-developed, well-nourished, and in no distress. No distress.  HENT:  Head: Normocephalic and atraumatic.  Mouth/Throat: No oropharyngeal exudate.  Eyes: Conjunctivae are normal. Pupils are equal, round, and reactive to light. No scleral icterus.  Neck: Normal range of motion. Neck supple. No JVD present.  Cardiovascular: Normal rate, regular rhythm and normal heart sounds.  Exam reveals no gallop and no friction rub.   No murmur heard. Pulmonary/Chest: Breath sounds normal. No  respiratory distress. She has no wheezes. She has no rales.  Abdominal: Soft. Bowel sounds are normal. She exhibits no distension. There is no tenderness. There is no guarding.  Musculoskeletal: She exhibits no edema or tenderness.  Lymphadenopathy:    She has no cervical adenopathy.  Neurological: She is alert and oriented to person, place, and time. No cranial nerve deficit.  Skin: Skin is warm and dry. No rash noted. No erythema. No pallor.  Psychiatric: Affect and judgment normal.     LABORATORY DATA: I have personally reviewed the data as listed:  Office Visit on 02/20/2017  Component Date Value Ref Range Status  . Rapid Strep A Screen 02/20/2017 Negative  Negative Final  Appointment on 02/12/2017  Component Date Value Ref Range Status  . WBC 02/12/2017 3.9* 4.0 - 10.5 K/uL Final  . RBC 02/12/2017 3.86* 3.87 - 5.11 MIL/uL Final  . Hemoglobin 02/12/2017 11.2* 12.0 - 15.0 g/dL Final  . HCT 02/12/2017 34.0* 36.0 - 46.0 % Final  . MCV 02/12/2017 88.1  78.0 - 100.0 fL Final  . MCH 02/12/2017 29.0  26.0 - 34.0 pg Final  . MCHC 02/12/2017 32.9  30.0 - 36.0 g/dL Final  . RDW 02/12/2017 14.9  11.5 - 15.5 % Final  . Platelets 02/12/2017 252  150 - 400 K/uL Final  . Neutrophils Relative % 02/12/2017 58  % Final  . Neutro Abs 02/12/2017 2.3  1.7 - 7.7 K/uL Final  . Lymphocytes Relative 02/12/2017 29  % Final  . Lymphs Abs 02/12/2017 1.1  0.7 - 4.0 K/uL Final  . Monocytes Relative 02/12/2017 9  % Final  . Monocytes Absolute 02/12/2017 0.4  0.1 - 1.0 K/uL Final  . Eosinophils Relative 02/12/2017 3  % Final  . Eosinophils Absolute 02/12/2017 0.1  0.0 - 0.7 K/uL Final  . Basophils Relative 02/12/2017 1  % Final  . Basophils Absolute 02/12/2017 0.0  0.0 - 0.1 K/uL Final  . Sodium 02/12/2017 139  135 - 145 mmol/L Final  . Potassium 02/12/2017 3.9  3.5 - 5.1 mmol/L Final  . Chloride 02/12/2017 101  101 - 111 mmol/L Final  . CO2 02/12/2017 31  22 - 32 mmol/L Final  . Glucose, Bld  02/12/2017 91  65 - 99 mg/dL Final  . BUN 02/12/2017 19  6 - 20 mg/dL Final  . Creatinine, Ser 02/12/2017 1.04* 0.44 - 1.00 mg/dL Final  . Calcium 02/12/2017 9.3  8.9 - 10.3 mg/dL Final  . Total Protein 02/12/2017 7.4  6.5 - 8.1 g/dL Final  . Albumin 02/12/2017 3.8  3.5 - 5.0 g/dL Final  .  AST 02/12/2017 27  15 - 41 U/L Final  . ALT 02/12/2017 16  14 - 54 U/L Final  . Alkaline Phosphatase 02/12/2017 67  38 - 126 U/L Final  . Total Bilirubin 02/12/2017 0.7  0.3 - 1.2 mg/dL Final  . GFR calc non Af Amer 02/12/2017 53* >60 mL/min Final  . GFR calc Af Amer 02/12/2017 >60  >60 mL/min Final   Comment: (NOTE) The eGFR has been calculated using the CKD EPI equation. This calculation has not been validated in all clinical situations. eGFR's persistently <60 mL/min signify possible Chronic Kidney Disease.   . Anion gap 02/12/2017 7  5 - 15 Final  . Erythropoietin 02/12/2017 25.0* 2.6 - 18.5 mIU/mL Final   Comment: (NOTE) [employer] UniCel DxI 800 Immunoassay System Performed At: [facility], [place] [id number] Lindon Romp MD LH:[id number]   . Iron 02/12/2017 65  28 - 170 ug/dL Final  . TIBC 02/12/2017 360  250 - 450 ug/dL Final  . Saturation Ratios 02/12/2017 18  10.4 - 31.8 % Final  . UIBC 02/12/2017 295  ug/dL Final   Performed at [facility], [place] [address] [place], [place] [address]  . Ferritin 02/12/2017 114  11 - 307 ng/mL Final   Performed at [facility], [place] [address], [place], [place] [address]  . Vitamin B-12 02/12/2017 2369* 180 - 914 pg/mL Final   Comment: (NOTE) This assay is not validated for testing neonatal or myeloproliferative syndrome specimens for Vitamin B12 levels. Performed at [facility], [place] [address], [place], [place] [address]   . Folate 02/12/2017 21.1  >5.9 ng/mL Final   Performed at [facility], [place] [address] [place], [place] [address]  . Hgb A2 Quant 02/12/2017 2.0  1.8 - 3.2 %  Final  . Hgb F Quant 02/12/2017 0.0  0.0 - 2.0 % Final  . Hgb S Quant 02/12/2017 0.0  0.0 % Final  . Hgb C 02/12/2017 0.0  0.0 % Corrected  . Hgb A 02/12/2017 98.0  96.4 - 98.8 % Final  . Hgb Variant 02/12/2017 0.0  0.0 % Corrected  . Please Note: 02/12/2017 Comment   Corrected   Comment: (NOTE) Normal adult hemoglobin present. Performed At: [facility] [place], [place] [id number] Lindon Romp MD QM:[id number]   . Retic Ct Pct 02/12/2017 1.9  0.4 - 3.1 % Final  . RBC. 02/12/2017 3.86* 3.87 - 5.11 MIL/uL Final  . Retic Count, Absolute 02/12/2017 73.3  19.0 - 186.0 K/uL Final  . Total Protein ELP 02/12/2017 6.9  6.0 - 8.5 g/dL Final  . IgG (Immunoglobin G), Serum 02/12/2017 1243  700 - 1,600 mg/dL Final  . IgA 02/12/2017 213  87 - 352 mg/dL Final  . IgM, Serum 02/12/2017 28  26 - 217 mg/dL Final   Comment: (NOTE) Performed At: [facility] [place], [place] [id number] Lindon Romp MD UU:[id number]   . Immunofixation Result, Serum 02/12/2017 Comment   Corrected   An apparent normal immunofixation pattern.  . Total Protein ELP 02/12/2017 6.5  6.0 - 8.5 g/dL Final  . Albumin ELP 02/12/2017 3.4  2.9 - 4.4 g/dL Final  . Alpha-1-Globulin 02/12/2017 0.2  0.0 - 0.4 g/dL Final  . Alpha-2-Globulin 02/12/2017 0.7  0.4 - 1.0 g/dL Final  . Beta Globulin 02/12/2017 1.0  0.7 - 1.3 g/dL Final  . Gamma Globulin 02/12/2017 1.2  0.4 - 1.8  g/dL Final  . M-Spike, % 02/12/2017 Not Observed  Not Observed g/dL Final  . SPE Interp. 02/12/2017 Comment   Final   Comment: (NOTE) The SPE pattern appears essentially unremarkable. Evidence of monoclonal protein is not apparent. Performed At: [facility] [place], [place] [id number] Lindon Romp MD JO:[id number]   . Comment 02/12/2017 Comment   Final   Comment: (NOTE) Protein electrophoresis scan will follow via computer, mail, or courier delivery.   Marland Kitchen GLOBULIN, TOTAL  02/12/2017 3.1  2.2 - 3.9 g/dL Corrected  . A/G Ratio 02/12/2017 1.1  0.7 - 1.7 Corrected  . Hepatitis B Surface Ag 02/12/2017 Negative  Negative Final  . HCV Ab 02/12/2017 0.1  0.0 - 0.9 s/co ratio Final   Comment: (NOTE)                                  Negative:     < 0.8                             Indeterminate: 0.8 - 0.9                                  Positive:     > 0.9 The CDC recommends that a positive HCV antibody result be followed up with a HCV Nucleic Acid Amplification test (601093). Performed At: [facility] [place], [place] [id number] Lindon Romp MD UR:[id number]   . Hep A IgM 02/12/2017 Negative  Negative Final  . Hep B C IgM 02/12/2017 Negative  Negative Final  Office Visit on 02/12/2017  Component Date Value Ref Range Status  . 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 2. Mild normocytic anemia  PLAN: I have reviewed her labwork in detail with the patient today. 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  Her mild anemia is stable. Recommend to continue observation of her CBC on a routine basis by her PCP.  No further workup necessary at this time. I will turn her back over to her PCP for monitoring. However I have told the patient that if there are any new issues in the future, she is more than welcomed to come f/u with [place] again.   All questions were answered. The patient knows to call the clinic with any problems, questions or concerns.  This note was electronically signed.    Twana First, MD  02/26/2017 12:25  PM

## 2017-02-27 ENCOUNTER — Ambulatory Visit (HOSPITAL_COMMUNITY): Payer: Self-pay

## 2017-04-14 DIAGNOSIS — H35372 Puckering of macula, left eye: Secondary | ICD-10-CM | POA: Diagnosis not present

## 2017-04-14 DIAGNOSIS — H35033 Hypertensive retinopathy, bilateral: Secondary | ICD-10-CM | POA: Diagnosis not present

## 2017-04-14 DIAGNOSIS — H35373 Puckering of macula, bilateral: Secondary | ICD-10-CM | POA: Diagnosis not present

## 2017-04-14 DIAGNOSIS — H25013 Cortical age-related cataract, bilateral: Secondary | ICD-10-CM | POA: Diagnosis not present

## 2017-04-14 DIAGNOSIS — H35371 Puckering of macula, right eye: Secondary | ICD-10-CM | POA: Diagnosis not present

## 2017-04-14 DIAGNOSIS — H2513 Age-related nuclear cataract, bilateral: Secondary | ICD-10-CM | POA: Diagnosis not present

## 2017-04-27 ENCOUNTER — Other Ambulatory Visit: Payer: Self-pay | Admitting: Family Medicine

## 2017-04-27 ENCOUNTER — Other Ambulatory Visit: Payer: Self-pay | Admitting: Cardiovascular Disease

## 2017-04-28 ENCOUNTER — Other Ambulatory Visit: Payer: Self-pay

## 2017-04-28 MED ORDER — LOSARTAN POTASSIUM 50 MG PO TABS: 25.0000 mg | ORAL_TABLET | Freq: Every day | ORAL | 5 refills | Status: DC

## 2017-04-28 NOTE — Telephone Encounter (Signed)
REFILL 

## 2017-05-13 ENCOUNTER — Telehealth: Payer: Self-pay

## 2017-05-13 NOTE — Telephone Encounter (Signed)
Called pt to schedule Medicare Annual Wellness Visit. -nr  

## 2017-05-15 DIAGNOSIS — R809 Proteinuria, unspecified: Secondary | ICD-10-CM | POA: Diagnosis not present

## 2017-05-15 DIAGNOSIS — Z79899 Other long term (current) drug therapy: Secondary | ICD-10-CM | POA: Diagnosis not present

## 2017-05-15 DIAGNOSIS — D509 Iron deficiency anemia, unspecified: Secondary | ICD-10-CM | POA: Diagnosis not present

## 2017-05-15 DIAGNOSIS — N183 Chronic kidney disease, stage 3 (moderate): Secondary | ICD-10-CM | POA: Diagnosis not present

## 2017-05-20 DIAGNOSIS — D649 Anemia, unspecified: Secondary | ICD-10-CM | POA: Diagnosis not present

## 2017-05-20 DIAGNOSIS — I10 Essential (primary) hypertension: Secondary | ICD-10-CM | POA: Diagnosis not present

## 2017-05-20 DIAGNOSIS — N183 Chronic kidney disease, stage 3 (moderate): Secondary | ICD-10-CM | POA: Diagnosis not present

## 2017-05-20 DIAGNOSIS — E559 Vitamin D deficiency, unspecified: Secondary | ICD-10-CM | POA: Diagnosis not present

## 2017-05-20 DIAGNOSIS — R809 Proteinuria, unspecified: Secondary | ICD-10-CM | POA: Diagnosis not present

## 2017-05-27 ENCOUNTER — Encounter: Payer: Self-pay | Admitting: Family Medicine

## 2017-05-27 ENCOUNTER — Ambulatory Visit (INDEPENDENT_AMBULATORY_CARE_PROVIDER_SITE_OTHER): Payer: PPO | Admitting: Family Medicine

## 2017-05-27 VITALS — BP 136/86 | HR 54 | Resp 18 | Ht 62.0 in | Wt 184.0 lb

## 2017-05-27 DIAGNOSIS — E669 Obesity, unspecified: Secondary | ICD-10-CM | POA: Diagnosis not present

## 2017-05-27 DIAGNOSIS — Z0000 Encounter for general adult medical examination without abnormal findings: Secondary | ICD-10-CM | POA: Diagnosis not present

## 2017-05-27 DIAGNOSIS — M79604 Pain in right leg: Secondary | ICD-10-CM

## 2017-05-27 DIAGNOSIS — Z1212 Encounter for screening for malignant neoplasm of rectum: Secondary | ICD-10-CM

## 2017-05-27 DIAGNOSIS — Z1211 Encounter for screening for malignant neoplasm of colon: Secondary | ICD-10-CM | POA: Diagnosis not present

## 2017-05-27 DIAGNOSIS — I10 Essential (primary) hypertension: Secondary | ICD-10-CM | POA: Diagnosis not present

## 2017-05-27 DIAGNOSIS — Z23 Encounter for immunization: Secondary | ICD-10-CM

## 2017-05-27 DIAGNOSIS — R221 Localized swelling, mass and lump, neck: Secondary | ICD-10-CM

## 2017-05-27 DIAGNOSIS — E785 Hyperlipidemia, unspecified: Secondary | ICD-10-CM | POA: Diagnosis not present

## 2017-05-27 LAB — HEMOCCULT GUIAC POC 1CARD (OFFICE): Fecal Occult Blood, POC: NEGATIVE

## 2017-05-27 MED ORDER — METHYLPREDNISOLONE ACETATE 80 MG/ML IJ SUSP
80.0000 mg | Freq: Once | INTRAMUSCULAR | Status: AC
  Administered 2017-05-27: 80 mg via INTRAMUSCULAR

## 2017-05-27 MED ORDER — KETOROLAC TROMETHAMINE 60 MG/2ML IM SOLN
60.0000 mg | Freq: Once | INTRAMUSCULAR | Status: AC
  Administered 2017-05-27: 60 mg via INTRAMUSCULAR

## 2017-05-27 MED ORDER — PREDNISONE 5 MG (21) PO TBPK: 5.0000 mg | ORAL_TABLET | ORAL | 0 refills | Status: DC

## 2017-05-27 MED ORDER — RANITIDINE HCL 300 MG PO TABS: 300.0000 mg | ORAL_TABLET | Freq: Every day | ORAL | 0 refills | Status: DC

## 2017-05-27 MED ORDER — MONTELUKAST SODIUM 10 MG PO TABS: 10.0000 mg | ORAL_TABLET | Freq: Every day | ORAL | 3 refills | Status: DC

## 2017-05-27 MED ORDER — IBUPROFEN 800 MG PO TABS: 800.0000 mg | ORAL_TABLET | Freq: Three times a day (TID) | ORAL | 1 refills | Status: DC | PRN

## 2017-05-27 NOTE — Patient Instructions (Addendum)
F/u in 5 months, call if you need me before  Flu vaccine today  You are referred for [place] of swelling of right neck, we will call with appt info  Injections, toradol 60 mg iM and  Depo medrol 80 mg IM  today for right leg pain worse in past 1 week and medication x 3 prescribed, no Asprin when taking ibuprofen and prednisone, call back if pain persists  Fasting lipid , cmp and EGFr, tSH and Vitamin D first week in December  Rectal exam today

## 2017-05-31 ENCOUNTER — Encounter: Payer: Self-pay | Admitting: Family Medicine

## 2017-05-31 DIAGNOSIS — R221 Localized swelling, mass and lump, neck: Secondary | ICD-10-CM | POA: Insufficient documentation

## 2017-05-31 DIAGNOSIS — M25561 Pain in right knee: Secondary | ICD-10-CM | POA: Insufficient documentation

## 2017-05-31 NOTE — Assessment & Plan Note (Addendum)
Several month history of intermittent pain, with 1 week h/o acute flare, anti inflammatory medication at high dose as this is the likley underlying cause, pt to call back for persisitent symptoms

## 2017-05-31 NOTE — Assessment & Plan Note (Addendum)
2 eek h/o painless right  neck mass in pt with h/o breast cancer and f/o throat cancer, refer for [place] of mass

## 2017-05-31 NOTE — Progress Notes (Signed)
    Desiree [name]     MRN: [id number]      DOB: 03-22-1946  HPI: Patient is in for annual physical exam. C/o mass on left side of neck x 2 weeks, concerned about possibility of cancer, she is a breast cancer survivor , and also her father had throat cancer C/o right foot and leg pain over past several months, but worse in the past week, states so severe this past weekend that she had extreme difficulty weight bearing. 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 Immunization is reviewed , and  updated    PE: BP 136/86   Pulse (!) 54   Resp 18   Ht 5\' 2"  (1.575 m)   Wt 184 lb (83.5 kg)   SpO2 99%   BMI 33.65 kg/m   Pleasant  female, alert and oriented x 3, in no cardio-pulmonary distress. Afebrile. HEENT No facial trauma or asymetry. Sinuses non tender.  Extra occullar muscles intact, pupils equally reactive to light. External ears normal, tympanic membranes clear. Oropharynx moist, no exudate. Neck: supple, no JVD No bruits.Right neck mass, approximately 2.5 cm diameter, non tender  Chest: Clear to ascultation bilaterally.No crackles or wheezes. Non tender to palpation  Breast: No asymetry,no masses or lumps. No tenderness. No nipple discharge or inversion. No axillary or supraclavicular adenopathy  Cardiovascular system; Heart sounds normal,  S1 and  S2 ,no S3.  No murmur, or thrill. Apical beat not displaced Peripheral pulses normal.  Abdomen: Soft, non tender, no organomegaly or masses. No bruits. Bowel sounds normal. No guarding, tenderness or rebound.  Rectal:  Normal sphincter tone. No rectal mass. Guaiac negative stool.  GU: Not examined, asymptomatic , not indicated   Musculoskeletal exam: Decreased though adequate  ROM of lumbar spine, normal in  hips , shoulders and knees.No  localized tenderness, redness or swelling of the right foot, no visible skin breakdown of the right foot or leg No deformity ,swelling or crepitus noted. No muscle wasting or atrophy.   Neurologic: Cranial nerves 2 to 12 intact. Power, tone ,sensation and reflexes normal throughout. No disturbance in gait. No tremor.  Skin: Intact, no ulceration, erythema , scaling or rash noted. Pigmentation normal throughout  Psych; Normal mood and affect. Judgement and concentration normal   Assessment & Plan:  Encounter for annual physical exam Annual exam as documented. Counseling done  re healthy lifestyle involving commitment to 150 minutes exercise per week, heart healthy diet, and attaining healthy weight.The importance of adequate sleep also discussed. Immunization and cancer screening needs are specifically addressed at this visit.   Mass of right side of neck 2 eek h/o painless right  neck mass in pt with h/o breast cancer and f/o throat cancer, refer for [place] of mass  Lower extremity pain, right Several month history of intermittent pain, with 1 week h/o acute flare, anti inflammatory medication at high dose as this is the likley underlying cause, pt to call back for persisitent symptoms

## 2017-05-31 NOTE — Assessment & Plan Note (Signed)
Annual exam as documented. Counseling done  re healthy lifestyle involving commitment to 150 minutes exercise per week, heart healthy diet, and attaining healthy weight.The importance of adequate sleep also discussed.  Immunization and cancer screening needs are specifically addressed at this visit.  

## 2017-06-04 ENCOUNTER — Ambulatory Visit (HOSPITAL_COMMUNITY)
Admission: RE | Admit: 2017-06-04 | Discharge: 2017-06-04 | Disposition: A | Discharge status: Home / Self Care | Payer: PPO | Source: Ambulatory Visit | Attending: Family Medicine | Admitting: Family Medicine

## 2017-06-04 DIAGNOSIS — R221 Localized swelling, mass and lump, neck: Secondary | ICD-10-CM | POA: Diagnosis not present

## 2017-06-08 ENCOUNTER — Encounter: Payer: Self-pay | Admitting: Family Medicine

## 2017-06-10 ENCOUNTER — Ambulatory Visit (INDEPENDENT_AMBULATORY_CARE_PROVIDER_SITE_OTHER): Payer: PPO

## 2017-06-10 VITALS — BP 136/80 | HR 60 | Temp 97.6°F | Ht 62.0 in | Wt 184.0 lb

## 2017-06-10 DIAGNOSIS — Z0000 Encounter for general adult medical examination without abnormal findings: Secondary | ICD-10-CM

## 2017-06-10 NOTE — Patient Instructions (Addendum)
Desiree [name] , Thank you for taking time to come for your Medicare Wellness Visit. I appreciate your ongoing commitment to your health goals. Please review the following plan we discussed and let me know if I can assist you in the future.   Screening recommendations/referrals: Colonoscopy: Up to date, next due 01/2024 Mammogram: Up to date, next due 12/2017 Bone Density: Up to date Recommended yearly ophthalmology/optometry visit for glaucoma screening and checkup Recommended yearly dental visit for hygiene and checkup  Vaccinations: Influenza vaccine: Up to date Pneumococcal vaccine: Up to date Tdap vaccine: Up to date, next due 06/2021 Shingles vaccine: Done    Advanced directives: Advance directive discussed with you today. I have provided a copy for you to complete at home and have notarized. Once this is complete please bring a copy in to our office so we can scan it into your chart.  Conditions/risks identified: Obese, recommend continuing your current exercise program and slowly increasing to 4 days a week or increasing the amount of time you are exercising each time.   Next appointment: Follow up with Dr. Moshe Cipro on 10/28/2017 at 2:40 pm. Follow up in 1 year for your annual wellness visit.  Preventive Care 27 Years and Older, Female Preventive care refers to lifestyle choices and visits with your health care provider that can promote health and wellness. What does preventive care include?  A yearly physical exam. This is also called an annual well check.  Dental exams once or twice a year.  Routine eye exams. Ask your health care provider how often you should have your eyes checked.  Personal lifestyle choices, including:  Daily care of your teeth and gums.  Regular physical activity.  Eating a healthy diet.  Avoiding tobacco and drug use.  Limiting alcohol use.  Practicing safe sex.  Taking low-dose aspirin every day.  Taking vitamin and mineral supplements as  recommended by your health care provider. What happens during an annual well check? The services and screenings done by your health care provider during your annual well check will depend on your age, overall health, lifestyle risk factors, and family history of disease. Counseling  Your health care provider may ask you questions about your:  Alcohol use.  Tobacco use.  Drug use.  Emotional well-being.  Home and relationship well-being.  Sexual activity.  Eating habits.  History of falls.  Memory and ability to understand (cognition).  Work and work Statistician.  Reproductive health. Screening  You may have the following tests or measurements:  Height, weight, and BMI.  Blood pressure.  Lipid and cholesterol levels. These may be checked every 5 years, or more frequently if you are over 56 years old.  Skin check.  Lung cancer screening. You may have this screening every year starting at age 21 if you have a 30-pack-year history of smoking and currently smoke or have quit within the past 15 years.  Fecal occult blood test (FOBT) of the stool. You may have this test every year starting at age 83.  Flexible sigmoidoscopy or colonoscopy. You may have a sigmoidoscopy every 5 years or a colonoscopy every 10 years starting at age 54.  Hepatitis C blood test.  Hepatitis B blood test.  Sexually transmitted disease (STD) testing.  Diabetes screening. This is done by checking your blood sugar (glucose) after you have not eaten for a while (fasting). You may have this done every 1-3 years.  Bone density scan. This is done to screen for osteoporosis. You may have  this done starting at age 41.  Mammogram. This may be done every 1-2 years. Talk to your health care provider about how often you should have regular mammograms. Talk with your health care provider about your test results, treatment options, and if necessary, the need for more tests. Vaccines  Your health care  provider may recommend certain vaccines, such as:  Influenza vaccine. This is recommended every year.  Tetanus, diphtheria, and acellular pertussis (Tdap, Td) vaccine. You may need a Td booster every 10 years.  Zoster vaccine. You may need this after age 62.  Pneumococcal 13-valent conjugate (PCV13) vaccine. One dose is recommended after age 74.  Pneumococcal polysaccharide (PPSV23) vaccine. One dose is recommended after age 33. Talk to your health care provider about which screenings and vaccines you need and how often you need them. This information is not intended to replace advice given to you by your health care provider. Make sure you discuss any questions you have with your health care provider. Document Released: 09/07/2015 Document Revised: 04/30/2016 Document Reviewed: 06/12/2015 Elsevier Interactive Patient Education  2017 [employer] Prevention in the Home Falls can cause injuries. They can happen to people of all ages. There are many things you can do to make your home safe and to help prevent falls. What can I do on the outside of my home?  Regularly fix the edges of walkways and driveways and fix any cracks.  Remove anything that might make you trip as you walk through a door, such as a raised step or threshold.  Trim any bushes or trees on the path to your home.  Use bright outdoor lighting.  Clear any walking paths of anything that might make someone trip, such as rocks or tools.  Regularly check to see if handrails are loose or broken. Make sure that both sides of any steps have handrails.  Any raised decks and porches should have guardrails on the edges.  Have any leaves, snow, or ice cleared regularly.  Use sand or salt on walking paths during winter.  Clean up any spills in your garage right away. This includes oil or grease spills. What can I do in the bathroom?  Use night lights.  Install grab bars by the toilet and in the tub and shower. Do  not use towel bars as grab bars.  Use non-skid mats or decals in the tub or shower.  If you need to sit down in the shower, use a plastic, non-slip stool.  Keep the floor dry. Clean up any water that spills on the floor as soon as it happens.  Remove soap buildup in the tub or shower regularly.  Attach bath mats securely with double-sided non-slip rug tape.  Do not have throw rugs and other things on the floor that can make you trip. What can I do in the bedroom?  Use night lights.  Make sure that you have a light by your bed that is easy to reach.  Do not use any sheets or blankets that are too big for your bed. They should not hang down onto the floor.  Have a firm chair that has side arms. You can use this for support while you get dressed.  Do not have throw rugs and other things on the floor that can make you trip. What can I do in the kitchen?  Clean up any spills right away.  Avoid walking on wet floors.  Keep items that you use a lot in easy-to-reach  places.  If you need to reach something above you, use a strong step stool that has a grab bar.  Keep electrical cords out of the way.  Do not use floor polish or wax that makes floors slippery. If you must use wax, use non-skid floor wax.  Do not have throw rugs and other things on the floor that can make you trip. What can I do with my stairs?  Do not leave any items on the stairs.  Make sure that there are handrails on both sides of the stairs and use them. Fix handrails that are broken or loose. Make sure that handrails are as long as the stairways.  Check any carpeting to make sure that it is firmly attached to the stairs. Fix any carpet that is loose or worn.  Avoid having throw rugs at the top or bottom of the stairs. If you do have throw rugs, attach them to the floor with carpet tape.  Make sure that you have a light switch at the top of the stairs and the bottom of the stairs. If you do not have them,  ask someone to add them for you. What else can I do to help prevent falls?  Wear shoes that:  Do not have high heels.  Have rubber bottoms.  Are comfortable and fit you well.  Are closed at the toe. Do not wear sandals.  If you use a stepladder:  Make sure that it is fully opened. Do not climb a closed stepladder.  Make sure that both sides of the stepladder are locked into place.  Ask someone to hold it for you, if possible.  Clearly mark and make sure that you can see:  Any grab bars or handrails.  First and last steps.  Where the edge of each step is.  Use tools that help you move around (mobility aids) if they are needed. These include:  Canes.  Walkers.  Scooters.  Crutches.  Turn on the lights when you go into a dark area. Replace any light bulbs as soon as they burn out.  Set up your furniture so you have a clear path. Avoid moving your furniture around.  If any of your floors are uneven, fix them.  If there are any pets around you, be aware of where they are.  Review your medicines with your doctor. Some medicines can make you feel dizzy. This can increase your chance of falling. Ask your doctor what other things that you can do to help prevent falls. This information is not intended to replace advice given to you by your health care provider. Make sure you discuss any questions you have with your health care provider. Document Released: 06/07/2009 Document Revised: 01/17/2016 Document Reviewed: 09/15/2014 Elsevier Interactive Patient Education  2017 [employer].

## 2017-06-10 NOTE — Progress Notes (Signed)
Subjective:   Desiree [name] is a 71 y.o. female who presents for Medicare Annual (Subsequent) preventive examination.  Review of Systems:  Cardiac Risk Factors include: advanced age (>48men, >33 women);dyslipidemia;hypertension;obesity (BMI >30kg/m2)     Objective:     Vitals: BP 136/80   Pulse 60   Temp 97.6 F (36.4 C) (Temporal)   Ht 5\' 2"  (1.575 m)   Wt 184 lb (83.5 kg)   SpO2 100%   BMI 33.65 kg/m   Body mass index is 33.65 kg/m.   Tobacco History  Smoking Status  . Never Smoker  Smokeless Tobacco  . Never Used     Counseling given: Not Answered   Past Medical History:  Diagnosis Date  . Allergic rhinitis   . Arthritis HIPS/ WRISTS  . DDD (degenerative disc disease), cervical   . GERD (gastroesophageal reflux disease) OCCASIONAL  . Heart murmur ASYMPTOMATIC  . History of Bell's palsy 2011  RIGHT SIDE -- RESOLVED  . 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 History of CVA (cerebrovascular accident) PER SCAN IN 2011  . Hyperlipidemia   . Hypertension   . Impaired fasting glucose PER PCP  DR SIMPSON   WATCH DIET  . Mixed urge and stress incontinence   . Nocturia   . Sinus drainage   . Thoracic ascending aortic aneurysm ([place]) LAST CHEST CT 09-02-2010--  FOLLOWED BY  CARDIOLOGIST--  DR EUMPNTIR  . Vaginal wall prolapse    Past Surgical History:  Procedure Laterality Date  . BILATERAL CARPAL TUNNEL RELEASE  1980'S  . CARDIAC CATHETERIZATION  02-01-2009  DR EICHHORN   NORMAL CORONARY ARTERIES/ NORMAL LV SIZE AND FUNCTION/ AORTIC ROOT SIZE AT THE UPPER LIMIT OF NORMAL   . CARPAL TUNNEL RELEASE    . CERVICAL DISC SURGERY  12-12-2005  DR Vertell Limber   LEFT  C6 - C7 HERINATED / DDD/ SPONDYLOSIS  . COLONOSCOPY N/A 02/10/2014   Procedure: COLONOSCOPY;  Surgeon: Danie Binder, MD;  Location: [facility];  Service: Endoscopy;  Laterality: N/A;  8:30  . CYSTOCELE REPAIR  08/02/2012   Procedure:  ANTERIOR REPAIR (CYSTOCELE);  Surgeon: Ailene Rud, MD;  Location: [facility];  Service: Urology;  Laterality: N/A;  [employer] Uphold Anterior Pelvic Floor Sacrospinus Repair. Anterior wall of the vagina.  . ESOPHAGOGASTRODUODENOSCOPY N/A 02/10/2014   Procedure: ESOPHAGOGASTRODUODENOSCOPY (EGD);  Surgeon: Danie Binder, MD;  Location: [facility];  Service: Endoscopy;  Laterality: N/A;  . GIVENS CAPSULE STUDY N/A 03/13/2014   Procedure: GIVENS CAPSULE STUDY;  Surgeon: Danie Binder, MD;  Location: [facility];  Service: Endoscopy;  Laterality: N/A;  7:30  . NECK SURGERY  2007  . RIGHT PARTIAL MASTECTOMY  11-11-2004  DR [name] YOUNG   DUCTAL CARCINOMA IN SITU RIGHT BREAST  . TOTAL ABDOMINAL HYSTERECTOMY W/ BILATERAL SALPINGOOPHORECTOMY  1990  . TRANSTHORACIC ECHOCARDIOGRAM  03-30-2008  DR MARGARET SIMPSON   LV SIZE AND FUNCTION NORMAL/ MODERATE AORTIC ARCH DILATATION/ MILD MR   Family History  Problem Relation Age of Onset  . Kidney failure Mother   . Hypertension Mother   . Heart attack Mother 37  . Throat cancer Father   . Prostate cancer Father   . Lung cancer Father 47  . Breast cancer Sister   . Hypertension Brother        2 stents  . Colon cancer Neg Hx   .  Colon polyps Neg Hx    History  Sexual Activity  . Sexual activity: Not Currently    Outpatient Encounter Prescriptions as of 06/10/2017  Medication Sig  . acetaminophen (TYLENOL) 650 MG CR tablet Take 650 mg by mouth every 8 (eight) hours as needed for pain.  Marland Kitchen aspirin EC 325 MG tablet Take 1 tablet (325 mg total) by mouth daily.  . betamethasone dipropionate (DIPROLENE) 0.05 % cream Apply topically 2 (two) times daily. Apply sparingly twice daily to affected area for 5 days, then as needed  . calcium-vitamin D (OSCAL 500/200 D-3) 500-200 MG-UNIT per tablet Take 1 tablet by mouth daily with breakfast.   . carvedilol (COREG) 3.125 MG tablet TAKE 1 TABLET BY MOUTH TWICE DAILY WITH A MEAL   . cholecalciferol (VITAMIN D) 1000 UNITS tablet Take 1,000 Units by mouth daily.  Marland Kitchen gabapentin (NEURONTIN) 100 MG capsule Two capsules at bedtime at bedtime  . hydrochlorothiazide (HYDRODIURIL) 25 MG tablet TAKE ONE TABLET BY MOUTH ONCE DAILY IN THE MORNING  . ibuprofen (ADVIL,MOTRIN) 800 MG tablet Take 1 tablet (800 mg total) by mouth every 8 (eight) hours as needed.  Marland Kitchen losartan (COZAAR) 50 MG tablet TAKE ONE TABLET BY MOUTH ONCE DAILY  . lovastatin (MEVACOR) 40 MG tablet TAKE ONE TABLET BY MOUTH ONCE DAILY WITH BREAKFAST  . meclizine (ANTIVERT) 12.5 MG tablet Take 1 tablet (12.5 mg total) by mouth 3 (three) times daily as needed for dizziness.  . montelukast (SINGULAIR) 10 MG tablet Take 1 tablet (10 mg total) by mouth at bedtime.  . ranitidine (ZANTAC) 300 MG tablet Take 1 tablet (300 mg total) by mouth at bedtime.  . solifenacin (VESICARE) 10 MG tablet Take 10 mg by mouth daily.  . [DISCONTINUED] hydrochlorothiazide (HYDRODIURIL) 25 MG tablet Take 12.5 mg by mouth daily.  . [DISCONTINUED] predniSONE (STERAPRED UNI-PAK 21 TAB) 5 MG (21) TBPK tablet Take 1 tablet (5 mg total) by mouth as directed. Use as directed   No facility-administered encounter medications on file as of 06/10/2017.     Activities of Daily Living In your present state of health, do you have any difficulty performing the following activities: 06/10/2017 01/14/2017  Hearing? N N  Vision? N N  Difficulty concentrating or making decisions? N N  Walking or climbing stairs? N N  Dressing or bathing? N N  Doing errands, shopping? N N  Preparing Food and eating ? N -  Using the Toilet? N -  In the past six months, have you accidently leaked urine? N -  Do you have problems with loss of bowel control? N -  Managing your Medications? N -  Managing your Finances? N -  Housekeeping or managing your Housekeeping? N -  Some recent data might be hidden    Patient Care Team: [name], MD as PCP -  General Grace Isaac, MD as Consulting Physician (Cardiothoracic Surgery) Croitoru, Dani Gobble, MD as Consulting Physician (Cardiology) Hortencia Pilar, MD as Consulting Physician (Ophthalmology) Carolan Clines, MD as Consulting Physician (Urology)    Assessment:    Exercise Activities and Dietary recommendations Current Exercise Habits: Home exercise routine, Type of exercise: walking, Time (Minutes): 30, Frequency (Times/Week): 3, Weekly Exercise (Minutes/Week): 90, Intensity: Mild, Exercise limited by: None identified  Goals    . Weight (lb) < 175 lb (79.4 kg)          Starting 08/05/2016 patient would like to maintain her current exercise routine and slowly increase to 40 minutes 3 times a  week.      Fall Risk Fall Risk  06/10/2017 01/14/2017 08/05/2016 08/02/2015 02/15/2015  Falls in the past year? No No No No No  Risk for fall due to : - - Impaired vision - -   Depression Screen PHQ 2/9 Scores 06/10/2017 01/14/2017 08/05/2016 07/08/2016  PHQ - 2 Score 0 0 0 0  PHQ- 9 Score - - - 0     Cognitive Function: Normal MMSE - Mini Mental State Exam 09/21/2014  Orientation to time 5  Orientation to Place 5  Registration 3  Attention/ Calculation 4  Recall 3  Language- name 2 objects 2  Language- repeat 1  Language- follow 3 step command 2  Language- read & follow direction 1  Write a sentence 1  Copy design 1  Total score 28     6CIT Screen 06/10/2017 08/05/2016  What Year? 0 points 0 points  What month? 0 points 0 points  What time? 0 points 0 points  Count back from 20 0 points 0 points  Months in reverse 0 points 0 points  Repeat phrase 0 points 0 points  Total Score 0 0    Immunization History  Administered Date(s) Administered  . Influenza Split 07/15/2011, 06/30/2012  . Influenza Whole 06/13/2010  . Influenza,inj,Quad PF,6+ Mos 07/13/2013, 05/22/2014, 06/12/2015, 07/08/2016, 05/27/2017  . Pneumococcal Conjugate-13 09/21/2014  . Pneumococcal  Polysaccharide-23 11/11/2011  . Tdap 07/15/2011  . Zoster 11/09/2006   Screening Tests Health Maintenance  Topic Date Due  . MAMMOGRAM  01/16/2019  . TETANUS/TDAP  07/14/2021  . COLONOSCOPY  02/13/2024  . INFLUENZA VACCINE  Completed  . DEXA SCAN  Completed  . Hepatitis C Screening  Completed  . PNA vac Low Risk Adult  Completed      Plan:   I have personally reviewed and noted the following in the patient's chart:   . Medical and social history . Use of alcohol, tobacco or illicit drugs  . Current medications and supplements . Functional ability and status . Nutritional status . Physical activity . Advanced directives . List of other physicians . Hospitalizations, surgeries, and ER visits in previous 12 months . Vitals . Screenings to include cognitive, depression, and falls . Referrals and appointments  In addition, I have reviewed and discussed with patient certain preventive protocols, quality metrics, and best practice recommendations. A written personalized care plan for preventive services as well as general preventive health recommendations were provided to patient.     Stormy Fabian, LPN  [id number]

## 2017-07-23 ENCOUNTER — Other Ambulatory Visit: Payer: Self-pay | Admitting: Cardiothoracic Surgery

## 2017-07-23 DIAGNOSIS — I712 Thoracic aortic aneurysm, without rupture, unspecified: Secondary | ICD-10-CM

## 2017-07-27 ENCOUNTER — Other Ambulatory Visit: Payer: Self-pay | Admitting: Family Medicine

## 2017-07-28 NOTE — Telephone Encounter (Signed)
Seen 10 3 18 

## 2017-08-20 ENCOUNTER — Ambulatory Visit: Payer: PPO | Admitting: Cardiothoracic Surgery

## 2017-08-20 ENCOUNTER — Encounter: Payer: Self-pay | Admitting: Cardiothoracic Surgery

## 2017-08-20 ENCOUNTER — Other Ambulatory Visit: Payer: Self-pay

## 2017-08-20 ENCOUNTER — Ambulatory Visit
Admission: RE | Admit: 2017-08-20 | Discharge: 2017-08-20 | Disposition: A | Discharge status: Home / Self Care | Payer: PPO | Source: Ambulatory Visit | Attending: Cardiothoracic Surgery | Admitting: Cardiothoracic Surgery

## 2017-08-20 VITALS — BP 170/78 | HR 68 | Resp 18 | Ht 62.0 in | Wt 185.6 lb

## 2017-08-20 DIAGNOSIS — I712 Thoracic aortic aneurysm, without rupture, unspecified: Secondary | ICD-10-CM

## 2017-08-20 MED ORDER — IOPAMIDOL (ISOVUE-370) INJECTION 76%: 75.0000 mL | Freq: Once | INTRAVENOUS | Status: DC | PRN

## 2017-08-20 NOTE — Progress Notes (Signed)
[address]       [place] [address]             [phone]                    Desiree [name] [facility] Medical Record #[id number] Date of Birth: [birth year]  Referring: [name], MD Primary Care: [name], MD Cardiology: Dr  Bertrum Sol  Chief Complaint:    Chief Complaint  Patient presents with  . Follow-up    Thoracic aortic aneurysm    History of Present Illness:    Patient is a 71 year old female who has had a known dilated ascending aorta since at least 2009 with serial CT scans of the chest having been performed. She was  referred to me several years ago after a recent CT scan showed 4.4 cm dilated ascending aorta. She has no known previous history of coronary artery disease.  She notes that she has been told that she had a stroke based on scans but does not know when this was or what the symptoms were if any. Patient's had no symptoms of congestive heart failure or angina.    Previous CT scans are reviewed with descending aorta 4.3 cm 2009 4.6 x 4.26 January 2009, 4.1 x 4.5 08/27/2009, January 2012 4.1 cm, on my measurement this appears unchanged from 2009.   Current Activity/ Functional Status:  Patient is independent with mobility/ambulation, transfers, ADL's, IADL's.  Zubrod Score: At the time of surgery this patient's most appropriate activity status/level should be described as: []  Normal activity, no symptoms [x]  Symptoms, fully ambulatory []  Symptoms, in bed less than or equal to 50% of the time []  Symptoms, in bed greater than 50% of the time but less than 100% []  Bedridden []  Moribund   Past Medical History:  Diagnosis Date  . Allergic rhinitis   . Arthritis HIPS/ WRISTS  . DDD (degenerative disc disease), cervical   . GERD (gastroesophageal reflux disease) OCCASIONAL  . Heart murmur ASYMPTOMATIC  . History of Bell's palsy 2011  RIGHT SIDE -- RESOLVED  . 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 History of CVA (cerebrovascular accident) PER SCAN IN 2011  . Hyperlipidemia   . Hypertension   . Impaired fasting glucose PER PCP  DR SIMPSON   WATCH DIET  . Mixed urge and stress incontinence   . Nocturia   . Sinus drainage   . Thoracic ascending aortic aneurysm ([place]) LAST CHEST CT 09-02-2010--  FOLLOWED BY  CARDIOLOGIST--  DR DPOEUMPN  . Vaginal wall prolapse     Past Surgical History:  Procedure Laterality Date  . BILATERAL CARPAL TUNNEL RELEASE  1980'S  . CARDIAC CATHETERIZATION  02-01-2009  DR EICHHORN   NORMAL CORONARY ARTERIES/ NORMAL LV SIZE AND FUNCTION/ AORTIC ROOT SIZE AT THE UPPER LIMIT OF NORMAL   . CARPAL TUNNEL RELEASE    . CERVICAL DISC SURGERY  12-12-2005  DR Vertell Limber   LEFT  C6 - C7 HERINATED / DDD/ SPONDYLOSIS  . COLONOSCOPY N/A 02/10/2014   Procedure: COLONOSCOPY;  Surgeon: Danie Binder, MD;  [facility];  Service: Endoscopy;  Laterality: N/A;  8:30  . CYSTOCELE REPAIR  08/02/2012   Procedure: ANTERIOR REPAIR (CYSTOCELE);  Surgeon: Ailene Rud, MD;  Location: [facility];  Service: Urology;  Laterality: N/A;  [place] Anterior Pelvic  Floor Sacrospinus Repair. Anterior wall of the vagina.  . ESOPHAGOGASTRODUODENOSCOPY N/A 02/10/2014   Procedure: ESOPHAGOGASTRODUODENOSCOPY (EGD);  Surgeon: Danie Binder, MD;  [facility]: [facility];  Service: Endoscopy;  Laterality: N/A;  . GIVENS CAPSULE STUDY N/A 03/13/2014   Procedure: GIVENS CAPSULE STUDY;  Surgeon: Danie Binder, MD;  Location: [facility];  Service: Endoscopy;  Laterality: N/A;  7:30  . NECK SURGERY  2007  . RIGHT PARTIAL MASTECTOMY  11-11-2004  DR [name] YOUNG   DUCTAL CARCINOMA IN SITU RIGHT BREAST  . TOTAL ABDOMINAL HYSTERECTOMY W/ BILATERAL SALPINGOOPHORECTOMY  1990  . TRANSTHORACIC ECHOCARDIOGRAM  03-30-2008  DR MARGARET SIMPSON   LV SIZE AND FUNCTION NORMAL/ MODERATE AORTIC ARCH DILATATION/ MILD  MR    Family History  Problem Relation Age of Onset  . Heart attack Mother 37  . Kidney failure Mother   . Hypertension Mother   . Throat cancer Father 62  . Prostate cancer Father     History   Social History  . Marital Status: Divorced    Spouse Name: N/A    Number of Children: 64  . Years of Education: N/A   Occupational History  . Not on file.   Social History Main Topics  . Smoking status: Never Smoker   . Smokeless tobacco: Never Used  . Alcohol Use: No  . Drug Use: No  . Sexual Activity: Not on file   Other Topics Concern  . 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 Codeine Other (See Comments)    CONFUSION/ DIZZY    Current Outpatient Medications  Medication Sig Dispense Refill  . acetaminophen (TYLENOL) 650 MG CR tablet Take 650 mg by mouth every 8 (eight) hours as needed for pain.    Marland Kitchen aspirin EC 325 MG tablet Take 1 tablet (325 mg total) by mouth daily. 100 tablet 3  . betamethasone dipropionate (DIPROLENE) 0.05 % cream Apply topically 2 (two) times daily. Apply sparingly twice daily to affected area for 5 days, then as needed 45 g 0  . calcium-vitamin D (OSCAL 500/200 D-3) 500-200 MG-UNIT per tablet Take 1 tablet by mouth daily with breakfast.     . carvedilol (COREG) 3.125 MG tablet TAKE 1 TABLET BY MOUTH TWICE DAILY WITH A MEAL 180 tablet 3  . cholecalciferol (VITAMIN D) 1000 UNITS tablet Take 1,000 Units by mouth daily.    Marland Kitchen gabapentin (NEURONTIN) 100 MG capsule Two capsules at bedtime at bedtime 180 capsule 3  . hydrochlorothiazide (HYDRODIURIL) 25 MG tablet TAKE ONE TABLET BY MOUTH ONCE DAILY IN THE MORNING 90 tablet 1  . ibuprofen (ADVIL,MOTRIN) 800 MG tablet Take 1 tablet (800 mg total) by mouth every 8  (eight) hours as needed. 21 tablet 1  . losartan (COZAAR) 50 MG tablet TAKE ONE TABLET BY MOUTH ONCE DAILY 90 tablet 1  . lovastatin (MEVACOR) 40 MG tablet TAKE 1 TABLET BY MOUTH ONCE DAILY WITH BREAKFAST 90 tablet 0  . meclizine (ANTIVERT) 12.5 MG tablet Take 1 tablet (12.5 mg total) by mouth 3 (three) times daily as needed for dizziness. 30 tablet 0  . montelukast (SINGULAIR) 10 MG tablet Take 1 tablet (10 mg total) by mouth at bedtime.  30 tablet 3  . solifenacin (VESICARE) 10 MG tablet Take 10 mg by mouth daily.     No current facility-administered medications for this visit.    Facility-Administered Medications Ordered in Other Visits  Medication Dose Route Frequency Provider Last Rate Last Dose  . iopamidol (ISOVUE-370) 76 % injection 75 mL  75 mL Intravenous Once PRN Grace Isaac, MD           Review of Systems:  Review of Systems  Constitutional: Negative.   HENT: Negative.   Eyes: Negative.   Respiratory: Negative for cough, hemoptysis, sputum production, shortness of breath and wheezing.   Cardiovascular: Negative for chest pain, palpitations, orthopnea, claudication, leg swelling and PND.  Gastrointestinal: Negative for abdominal pain, blood in stool, constipation, diarrhea, heartburn, melena, nausea and vomiting.  Genitourinary: Negative.   Musculoskeletal: Positive for joint pain. Negative for back pain, falls, myalgias and neck pain.  Skin: Negative.   Neurological: Negative.   Endo/Heme/Allergies: Negative.   Psychiatric/Behavioral: Negative.     Physical Exam: BP (!) 170/78 (BP Location: Left Arm, Patient Position: Sitting, Cuff Size: Large)   Pulse 68   Resp 18   Ht 5\' 2"  (1.575 m)   Wt 185 lb 9.6 oz (84.2 kg)   SpO2 99% Comment: on RA  BMI 33.95 kg/m   General appearance: alert, cooperative, appears stated age and no distress Head: Normocephalic, without obvious abnormality, atraumatic Neck: no adenopathy, no carotid bruit, no JVD, supple,  symmetrical, trachea midline, thyroid not enlarged, symmetric, no tenderness/mass/nodules and Prominent carotid pulse right greater than left Lymph nodes: Cervical, supraclavicular, and axillary nodes normal. Resp: clear to auscultation bilaterally Back: symmetric, no curvature. ROM normal. No CVA tenderness. Cardio: regular rate and rhythm, S1, S2 normal, no murmur, click, rub or gallop GI: soft, non-tender; bowel sounds normal; no masses,  no organomegaly Extremities: extremities normal, atraumatic, no cyanosis or edema and Homans sign is negative, no sign of DVT Neurologic: Grossly normal Patient has palpable DP and PT pulses bilaterally, no palpable abdominal aneurysm appreciated    Diagnostic Studies & Laboratory data:   ECHO: 06/29/2013 Transthoracic Echocardiography  Patient:  Desiree [name], Desiree [name] MR #:    [id number] Study Date: 06/29/2013 Gender:   F Age:    82 Height:   154.9cm Weight:   84.8kg BSA:    1.29m^2 Pt. Status: Room:  ATTENDING  Jimmy Picket SONOGRAPHER Marygrace Drought, RCS PERFORMING  [place] cc:  ------------------------------------------------------------ LV EF: 55% -  65%  ------------------------------------------------------------ Indications:   424.1 Aortic valve disorders.  ------------------------------------------------------------ History:  PMH: Hyperlipidemia Chest pain. Dyspnea. Risk factors: Hypertension.  ------------------------------------------------------------ Study Conclusions  - Left ventricle: The cavity size was normal. Wall thickness was normal. Systolic function was normal. The estimated ejection fraction was in the range of 55% to 65%. Wall motion was normal; there were no regional wall motion abnormalities. Left ventricular diastolic function parameters were normal. - Aortic valve: Trileaflet; mildly thickened  leaflets. Mild regurgitation. - Mitral valve: Systolic bowing without prolapse. Mild regurgitation. Valve area by pressure half-time: 1.9cm^2. - Atrial septum: No defect or patent foramen ovale was identified. - Tricuspid valve: Mild regurgitation. Transthoracic echocardiography. M-mode, complete 2D, spectral Doppler, and color Doppler. Height: Height: 154.9cm. Height: 61in. Weight: Weight: 84.8kg. Weight: 186.6lb. Body mass index: BMI: 35.3kg/m^2. Body surface area:  BSA: 1.40m^2. Blood pressure:   134/80. Patient status: Outpatient. Location: Echo laboratory.  ------------------------------------------------------------  ------------------------------------------------------------ Left ventricle: The cavity size was normal.  Wall thickness was normal. Systolic function was normal. The estimated ejection fraction was in the range of 55% to 65%. Wall motion was normal; there were no regional wall motion abnormalities. The transmitral flow pattern was normal. The deceleration time of the early transmitral flow velocity was normal. The pulmonary vein flow pattern was normal. The tissue Doppler parameters were normal. Left ventricular diastolic function parameters were normal.  ------------------------------------------------------------ Aortic valve:  Trileaflet; mildly thickened leaflets. Doppler:  There was no stenosis.  Mild regurgitation. VTI ratio of LVOT to aortic valve: 0.54. Peak velocity ratio of LVOT to aortic valve: 0.53.  Mean gradient: 57mm Hg (S). Peak gradient: 43mm Hg (S).  ------------------------------------------------------------ Mitral valve:  Mildly thickened leaflets . Systolic bowing without prolapse. Systolic bowing without prolapse. Doppler:  Mild regurgitation.  Valve area by pressure half-time: 1.9cm^2. Indexed valve area by pressure half-time: 1.03cm^2/m^2.  Mean gradient: 42mm Hg (D). Peak gradient: 29mm Hg  (D).  ------------------------------------------------------------ Left atrium: LA volume/ BSA = 25.54ml/m2 The atrium was normal in size.  ------------------------------------------------------------ Atrial septum: No defect or patent foramen ovale was identified.  ------------------------------------------------------------ Right ventricle: The cavity size was normal. Systolic function was normal. Systolic pressure was within the normal range.  ------------------------------------------------------------ Pulmonic valve:  Doppler:  Trivial regurgitation.  ------------------------------------------------------------ Tricuspid valve:  Doppler:  Mild regurgitation.  ------------------------------------------------------------ Pulmonary artery:  The main pulmonary artery was normal-sized.  ------------------------------------------------------------ Right atrium: The atrium was normal in size.  ------------------------------------------------------------ Pericardium: There was no pericardial effusion.  ------------------------------------------------------------ Systemic veins: Inferior vena cava: The vessel was normal in size; the respirophasic diameter changes were in the normal range (= 50%); findings are consistent with normal central venous pressure. Diameter: 66mm.  ------------------------------------------------------------  2D measurements    Normal Doppler measurements  Normal IVC              Main pulmonary Diam     11 mm   ------ artery Left ventricle         Pressure,  25 mm Hg =30 LVID ED,  43.8 mm   43-52  S chord,             Left ventricle PLAX              Ea, lat   11 cm/s  ------ LVID ES,  29.8 mm   23-38  ann, tiss chord,             DP PLAX              E/Ea, lat 7.58    ------ FS, chord,  32 %   >29   ann, tiss PLAX               DP LVPW, ED  9.89 mm   ------ Ea, med   7.5 cm/s  ------ IVS/LVPW   1    <1.3  ann, tiss ratio, ED           DP Ventricular septum       E/Ea, med 11.1    ------ IVS, ED  9.89 mm   ------ ann, tiss   2 Aorta             DP Root diam,  42 mm   ------ LVOT ED               Peak vel,  108 cm/s  ------ Left atrium          S AP dim    39 mm   ------  VTI, S   27.5 cm   ------ AP dim   2.12 cm/m^2 <2.2  Aortic valve index             Peak vel,  204 cm/s  ------ Right ventricle        S RVID ED,  28.7 mm   19-38  Mean vel,  138 cm/s  ------ PLAX              S                VTI, S   51.1 cm   ------                Mean     9 mm Hg ------                gradient,                S                Peak     17 mm Hg ------                gradient,                S                VTI ratio 0.54    ------                LVOT/AV                Peak vel  0.53    ------                ratio,                LVOT/AV                Regurg PHT 816 ms   ------                Mitral valve                Peak E vel 83.4 cm/s  ------                Peak A vel 82.4 cm/s  ------                Mean vel, 54.6 cm/s  ------                D                Decelerati 190 ms   150-23                on time        0                Pressure  116 ms   ------                half-time                Mean     1 mm Hg ------                 gradient,                D                Peak     4 mm Hg ------  gradient,                D                Peak E/A   1    ------                ratio                Area (PHT) 1.9 cm^2  ------                Area index 1.03 cm^2/m ------                (PHT)      ^2                Annulus  40.4 cm   ------                VTI                Tricuspid valve                Regurg   221 cm/s  ------                peak vel                Peak RV-RA  20 mm Hg ------                gradient,                S                Systemic veins                Estimated   5 mm Hg ------                CVP                Right ventricle                Pressure,  25 mm Hg <30                S                Sa vel,  11.8 cm/s  ------                lat ann,                tiss DP  ------------------------------------------------------------ Prepared and Electronically Authenticated by  Shelva Majestic   [id number]:[id number]   Recent Radiology Findings:  Ct Angio Chest Aorta W/cm &/or Wo/cm  Result Date: 08/20/2017 CLINICAL DATA:  71 year old female with a history of thoracic aortic aneurysm EXAM: CT ANGIOGRAPHY CHEST WITH CONTRAST TECHNIQUE: Multidetector CT imaging of the chest was performed using the standard protocol during bolus administration of intravenous contrast. Multiplanar CT image reconstructions and MIPs were obtained to evaluate the vascular anatomy. CONTRAST:  75 cc Isovue 370 COMPARISON:  Multiple prior  comparison, with initial 03/31/2008 FINDINGS: Cardiovascular: Heart: Cardiac diameter unchanged with borderline cardiomegaly. No pericardial fluid/thickening no significant coronary calcifications Aorta: Unchanged configuration of the thoracic aorta. Greatest diameter measured perpendicular to the flow channel, approximately 4.2 cm. The diameter in a similar location on the CT of 2009 is a similar diameter of 4.2 cm/4.3 Cm. No peri aortic fluid. No dissection. Pulmonary arteries: No central, lobar, segmental, or proximal subsegmental filling defects. Mediastinum/Nodes: No  adenopathy of the mediastinum. Unremarkable appearance of the thoracic inlet. Unremarkable course of the thoracic esophagus Lungs/Pleura: No endotracheal or endobronchial debris. Mild ground-glass opacity of the dependent lung bases, favor to represent atelectasis. No confluent airspace disease or pleural effusion. No pneumothorax. Upper Abdomen: No acute abnormality. Musculoskeletal: No displaced fracture. Degenerative changes of the spine. Review of the MIP images confirms the above findings. IMPRESSION: No acute finding. Unchanged diameter of the ascending aorta dating to the initial comparison CT of 03/31/2008. Signed, Dulcy Fanny. Earleen Newport, DO [facility] Electronically Signed   By: Corrie Mckusick D.O.   On: 08/20/2017 13:51   I have independently reviewed the above radiology studies  and reviewed the findings with the patient.   Ct Angio Chest Aorta W &/or Wo Contrast  Result Date: 07/24/2016 CLINICAL DATA:  Thoracic aortic aneurysm fall. Nonsmoker. History of right breast cancer with partial mastectomy. No chest complaints. EXAM: CT ANGIOGRAPHY CHEST WITH CONTRAST TECHNIQUE: Multidetector CT imaging of the chest was performed using the standard protocol during bolus administration of intravenous contrast. Multiplanar CT image reconstructions and MIPs were obtained to evaluate the  vascular anatomy. CONTRAST:  75 cc of Isovue 370 IV COMPARISON:  06/29/2014 CT FINDINGS: Cardiovascular: There has been no significant interval change in the appearance of the fusiform ascending thoracic aortic aneurysm. Greatest ascending aortic dimension is 4.3 cm and along the arch, 3.3 cm. No descending thoracic aortic aneurysm. There is no aortic dissection, wall thickening or evidence of leak. Normal heart size. No pericardial effusion. Great vessels are unremarkable with normal takeoff anatomy. Mediastinum/Nodes: No enlarged mediastinal, hilar, or axillary lymph nodes. Thyroid gland, trachea, and esophagus demonstrate no significant findings. Lungs/Pleura: Bibasilar dependent atelectasis. No pneumothorax or effusion. Minimal scarring in the right middle lobe versus atelectasis. No suspicious nodules or pulmonary masses. Upper Abdomen: Stable incidental hepatic cysts. Postsurgical changes right breast. 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 No suspicious osseous lesions. Review of the MIP images confirms the above findings. IMPRESSION: Stable fusiform ascending aortic aneurysm measuring up to 4.3 cm maximum, stable since 2015. No acute pulmonary abnormality. Electronically Signed   By: Ashley Royalty M.D.   On: 07/24/2016 14:51       06/06/2013   CLINICAL DATA:  Neck pain with right-sided radicular symptoms  EXAM: CERVICAL SPINE  4+ VIEWS  COMPARISON:  None.  FINDINGS: Frontal, lateral, open-mouth odontoid, and bilateral oblique views were obtained. There is no fracture or spondylolisthesis. Prevertebral soft tissues and predental space regions are normal.  There is fairly marked disc space narrowing at C4-5. There is moderate disc space narrowing at C7-T1. There is mild disc space narrowing at C3-4. There is the exit foraminal narrowing at C4-5 due to the facet hypertrophy.  There is a small cervical rib on the right.  IMPRESSION: Multilevel osteoarthritic change. No fracture  or spondylolisthesis. Small cervical rib on the right.   Electronically Signed   By: Lowella Grip M.D.   On: 06/06/2013 21:19   Dg Thoracic Spine W/swimmers  06/06/2013   CLINICAL DATA:  Pain with radicular symptoms  EXAM: THORACIC SPINE - 2 VIEW + SWIMMERS  COMPARISON:  None.  FINDINGS: Frontal, lateral, and swimmer's views were obtained. There is no fracture or spondylolisthesis. There the is multilevel disc space narrowing with several small osteophytes consistent with osteoarthritic change. No erosive change.  IMPRESSION: Multilevel osteoarthritic change. No fracture or spondylolisthesis.   Electronically Signed   By: Lowella Grip M.D.  On: 06/06/2013 21:20   Ct Angio Chest Aorta W/cm &/or Wo/cm  06/09/2013   CLINICAL DATA:  Aneurysm  EXAM: CT ANGIOGRAPHY CHEST WITH CONTRAST  TECHNIQUE: Multidetector CT imaging of the chest was performed using the standard protocol during bolus administration of intravenous contrast. Multiplanar CT image reconstructions including MIPs were obtained to evaluate the vascular anatomy.  CONTRAST:  153mL OMNIPAQUE IOHEXOL 350 MG/ML SOLN  COMPARISON:  09/02/2010  FINDINGS: Maximal aortic diameter at the sinus of Valsalva, sino-tubular junction, and ascending aorta are 3.4 cm, 2.9 cm, and 4.5 cm respectively. Previously, the maximal diameter in the ascending aorta was 4.2 cm. No evidence of dissection.  Origins of the great vessels are patent. Right subclavian and common carotid arteries are patent. Visualized vertebral arteries are patent.  No obvious filling defect in the pulmonary arterial tree to suggest acute pulmonary thromboembolism.  No abnormal mediastinal adenopathy or pericardial effusion.  Scattered linear atelectasis in the lungs without consolidation or mass.  Degenerative disc disease within the thoracic spine. No compression deformity. Stable liver cyst.  Review of the MIP images confirms the above findings.  IMPRESSION: Ascending aortic aneurysm has  increased from 4.2 cm to 4.5 cm in maximal diameter.   Electronically Signed   By: Maryclare Bean M.D.   On: 06/09/2013 07:39    ECHO: 03/30/2008: SUMMARY - Overall left ventricular systolic function was normal. There were no left ventricular regional wall motion abnormalities. There was moderate asymmetric septal hypertrophy. - The AV leaflets themselves appear normal with normal opening. However, there are increased velocities and gradients. I do not appreciate a subvalvular membrane and there does not appear to be LVOT obstruction from ASH. Consider TEE or cardiac CT if clinically indicated for enhanced visualization. The mean transaortic valve gradient was 11 mmHg. Estimated aortic valve area (by VTI) was 1.74 cm^2. Estimated aortic valve area (by Vmax) was 1.72 cm^2. - There appears to be a linear density in the prox asc aorta without definitive color flow appreciated on doppler in this area. This is not seen in all views. Additionally, the prox asc aorta is mild to mod dilated in this region. Recommend TEE or cardiac/chest CT to eval for poss aneurysm and/or dissection if clinically indicated. There was moderate aortic arch dilatation. - There was mild mitral valvular regurgitation. The effective orifice of mitral regurgitation by proximal isovelocity surface area was 0.21 cm^2. The volume of mitral regurgitation by proximal isovelocity surface area was 26 cc. - The estimated peak right ventricular systolic pressure was mildly increased.   Recent Lab Findings: Lab Results  Component Value Date   WBC 3.9 (L) 02/12/2017   HGB 11.2 (L) 02/12/2017   HCT 34.0 (L) 02/12/2017   PLT 252 02/12/2017   GLUCOSE 91 02/12/2017   CHOL 142 12/06/2016   TRIG 36 12/06/2016   HDL 87 12/06/2016   LDLCALC 48 12/06/2016   ALT 16 02/12/2017   AST 27 02/12/2017   NA 139 02/12/2017   K 3.9 02/12/2017   CL 101 02/12/2017   CREATININE 1.04 (H) 02/12/2017   BUN 19 02/12/2017   CO2 31 02/12/2017     TSH 2.56 07/02/2016   HGBA1C 5.6 05/27/2014   Chronic Kidney Disease   Stage I     GFR >90  Stage II    GFR 60-89  Stage IIIA GFR 45-59  Stage IIIB GFR 30-44  Stage IV   GFR 15-29  Stage V    GFR  <15  Lab Results  Component  Value Date   CREATININE 1.04 (H) 02/12/2017   CrCl cannot be calculated (Patient's most recent lab result is older than the maximum 21 days allowed.).  Aortic Size Index=   4.3    /Body surface area is 1.92 meters squared. =2.33  < 2.75 cm/m2      4% risk per year 2.75 to 4.25          8% risk per year > 4.25 cm/m2    20% risk per year  Aortic cross sectionarea/height= 9.2  Assessment / Plan:   Stable fusiform ascending aortic aneurysm measuring up to 4.3 cm maximum,  stable since 2009 Trileaflet aortic valve  No family history of aortic aneurysm, aortic dissection, or sudden death at early age of unknown cause History of hypertension currently treated with a blocker and ARB Stage IIIa chronic renal disease   I discussed with the patient and her daughter the diagnosis of mildly dilated ascending aorta and the risks associated with the diagnosis, their questions have been answered  Plan see the patient back in 1 year with a follow-up CTA of the chest  Patient was warned about not using Cipro and similar antibiotics. Recent studies have raised concern that fluoroquinolone antibiotics could be associated with an increased risk of aortic aneurysm Fluoroquinolones have non-antimicrobial properties that might jeopardise the integrity of the extracellular matrix of the vascular wall In a  propensity score matched cohort study in [place], there was a 66% increased rate of aortic aneurysm or dissection associated with oral fluoroquinolone use, compared with amoxicillin use, within a 60 day risk period from start of treatment    Grace Isaac MD      [place] [place] [address] Office [phone]   Beeper [phone]

## 2017-08-20 NOTE — Patient Instructions (Signed)

## 2017-09-10 ENCOUNTER — Other Ambulatory Visit: Payer: Self-pay | Admitting: Family Medicine

## 2017-10-26 DIAGNOSIS — E669 Obesity, unspecified: Secondary | ICD-10-CM | POA: Diagnosis not present

## 2017-10-26 DIAGNOSIS — I10 Essential (primary) hypertension: Secondary | ICD-10-CM | POA: Diagnosis not present

## 2017-10-26 DIAGNOSIS — E785 Hyperlipidemia, unspecified: Secondary | ICD-10-CM | POA: Diagnosis not present

## 2017-10-27 LAB — LIPID PANEL
CHOLESTEROL: 170 mg/dL (ref <200)
HDL: 79 mg/dL (ref >50)
LDL Cholesterol (Calc): 78 mg/dL (calc)
Non-HDL Cholesterol (Calc): 91 mg/dL (calc) (ref <130)
Total CHOL/HDL Ratio: 2.2 (calc) (ref <5.0)
Triglycerides: 47 mg/dL (ref <150)

## 2017-10-27 LAB — COMPLETE METABOLIC PANEL WITH GFR
AG RATIO: 1.4 (calc) (ref 1.0–2.5)
ALT: 12 U/L (ref 6–29)
AST: 20 U/L (ref 10–35)
Albumin: 3.8 g/dL (ref 3.6–5.1)
Alkaline phosphatase (APISO): 80 U/L (ref 33–130)
BILIRUBIN TOTAL: 0.6 mg/dL (ref 0.2–1.2)
BUN/Creatinine Ratio: 16 (calc) (ref 6–22)
BUN: 18 mg/dL (ref 7–25)
CHLORIDE: 105 mmol/L (ref 98–110)
CO2: 34 mmol/L — ABNORMAL HIGH (ref 20–32)
Calcium: 9.3 mg/dL (ref 8.6–10.4)
Creat: 1.15 mg/dL — ABNORMAL HIGH (ref 0.60–0.93)
GFR, Est African American: 55 mL/min/{1.73_m2} — ABNORMAL LOW (ref >=60)
GFR, Est Non African American: 48 mL/min/{1.73_m2} — ABNORMAL LOW (ref >=60)
GLUCOSE: 103 mg/dL — AB (ref 65–99)
Globulin: 2.7 g/dL (calc) (ref 1.9–3.7)
POTASSIUM: 3.7 mmol/L (ref 3.5–5.3)
Sodium: 142 mmol/L (ref 135–146)
Total Protein: 6.5 g/dL (ref 6.1–8.1)

## 2017-10-27 LAB — VITAMIN D 25 HYDROXY (VIT D DEFICIENCY, FRACTURES): Vit D, 25-Hydroxy: 32 ng/mL (ref 30–100)

## 2017-10-27 LAB — TSH: TSH: 4.13 mIU/L (ref 0.40–4.50)

## 2017-10-28 ENCOUNTER — Ambulatory Visit (INDEPENDENT_AMBULATORY_CARE_PROVIDER_SITE_OTHER): Payer: PPO | Admitting: Family Medicine

## 2017-10-28 ENCOUNTER — Encounter: Payer: Self-pay | Admitting: Family Medicine

## 2017-10-28 VITALS — BP 158/90 | HR 53 | Resp 16 | Ht 62.0 in | Wt 182.0 lb

## 2017-10-28 DIAGNOSIS — Z1231 Encounter for screening mammogram for malignant neoplasm of breast: Secondary | ICD-10-CM

## 2017-10-28 DIAGNOSIS — I10 Essential (primary) hypertension: Secondary | ICD-10-CM

## 2017-10-28 DIAGNOSIS — N39498 Other specified urinary incontinence: Secondary | ICD-10-CM | POA: Diagnosis not present

## 2017-10-28 DIAGNOSIS — E785 Hyperlipidemia, unspecified: Secondary | ICD-10-CM

## 2017-10-28 DIAGNOSIS — E669 Obesity, unspecified: Secondary | ICD-10-CM | POA: Diagnosis not present

## 2017-10-28 DIAGNOSIS — E8881 Metabolic syndrome: Secondary | ICD-10-CM | POA: Diagnosis not present

## 2017-10-28 MED ORDER — MONTELUKAST SODIUM 10 MG PO TABS: 10.0000 mg | ORAL_TABLET | Freq: Every day | ORAL | 1 refills | Status: DC

## 2017-10-28 MED ORDER — LOVASTATIN 40 MG PO TABS: ORAL_TABLET | ORAL | 1 refills | Status: DC

## 2017-10-28 NOTE — Patient Instructions (Addendum)
F/u in 6 month, call if you need me before, advanced care planning at that visit  Please schedule mammogram at checkout  Return in 1 week for me to recheck your BP at 1pm , PLEASE take your medication, every day at the same time ( no appt needs to be scheduled for this) Labs are good, except we will continue to watch kidneys  It is important that you exercise regularly at least 30 minutes 5 times a week. If you develop chest pain, have severe difficulty breathing, or feel very tired, stop exercising immediately and seek medical attention    Please cut back on bread and increase vegetable except potato

## 2017-11-01 ENCOUNTER — Encounter: Payer: Self-pay | Admitting: Family Medicine

## 2017-11-01 NOTE — Progress Notes (Signed)
   Desiree [name]     MRN: [id number]      DOB: Feb 18, 1946   HPI Desiree [name] is here for follow up and re-evaluation of chronic medical conditions, medication management and review of any available recent lab and radiology data.  Preventive health is updated, specifically  Cancer screening and Immunization.    The PT denies any adverse reactions to current medications since the last visit. Not taking blood pressure medication consistently and this is high on day of visit, states she "forgot"  C/o intermittent feeling of being "off balance"for years, denies any falls, denies actual vertigo   ROS Denies recent fever or chills. Denies sinus pressure, nasal congestion, ear pain or sore throat. Denies chest congestion, productive cough or wheezing. Denies chest pains, palpitations and leg swelling Denies abdominal pain, nausea, vomiting,diarrhea or constipation.   Denies dysuria, frequency, hesitancy or incontinence. Denies joint pain, swelling and limitation in mobility. Denies headaches, seizures, numbness, or tingling. Denies depression, anxiety or insomnia. Denies skin break down or rash.   PE  BP (!) 158/90   Pulse (!) 53   Resp 16   Ht 5\' 2"  (1.575 m)   Wt 182 lb (82.6 kg)   SpO2 95%   BMI 33.29 kg/m   Patient alert and oriented and in no cardiopulmonary distress.  HEENT: No facial asymmetry, EOMI,   oropharynx pink and moist.  Neck supple no JVD, no mass.  Chest: Clear to auscultation bilaterally.  CVS: S1, S2 no murmurs, no S3.Regular rate.  ABD: Soft non tender.   Ext: No edema  MS: Adequate though reduced  ROM spine, shoulders, hips and knees.  Skin: Intact, no ulcerations or rash noted.  Psych: Good eye contact, normal affect. Memory intact not anxious or depressed appearing.  CNS: CN 2-12 intact, power,  normal throughout.no focal deficits noted.   Assessment & Plan  Essential hypertension Uncontrolled, needs to resume medication as prescribed and is  to return in 1 week for repeat BP check  Urinary incontinence Controlled, no change in medication   Obesity (BMI 30-39.9) Improved. Patient re-educated about  the importance of commitment to a  minimum of 150 minutes of exercise per week.  The importance of healthy food choices with portion control discussed. Encouraged to start a food diary, count calories and to consider  joining a support group. Sample diet sheets offered. Goals set by the patient for the next several months.   Weight /BMI 10/28/2017 08/20/2017 06/10/2017  WEIGHT 182 lb 185 lb 9.6 oz 184 lb  HEIGHT 5\' 2"  5\' 2"  5\' 2"   BMI 33.29 kg/m2 33.95 kg/m2 33.65 kg/m2      Hyperlipidemia LDL goal <100 Hyperlipidemia:Low fat diet discussed and encouraged.   Lipid Panel  Lab Results  Component Value Date   CHOL 170 10/26/2017   HDL 79 10/26/2017   LDLCALC 48 12/06/2016   TRIG 47 10/26/2017   CHOLHDL 2.2 10/26/2017  Controlled, no change in medication      Metabolic syndrome X The increased risk of cardiovascular disease associated with this diagnosis, and the need to consistently work on lifestyle to change this is discussed. Following  a  heart healthy diet ,commitment to 30 minutes of exercise at least 5 days per week, as well as control of blood sugar and cholesterol , and achieving a healthy weight are all the areas to be addressed .

## 2017-11-01 NOTE — Assessment & Plan Note (Signed)
Hyperlipidemia:Low fat diet discussed and encouraged.   Lipid Panel  Lab Results  Component Value Date   CHOL 170 10/26/2017   HDL 79 10/26/2017   LDLCALC 48 12/06/2016   TRIG 47 10/26/2017   CHOLHDL 2.2 10/26/2017  Controlled, no change in medication

## 2017-11-01 NOTE — Assessment & Plan Note (Signed)
Uncontrolled, needs to resume medication as prescribed and is to return in 1 week for repeat BP check

## 2017-11-01 NOTE — Assessment & Plan Note (Signed)
Improved. Patient re-educated about  the importance of commitment to a  minimum of 150 minutes of exercise per week.  The importance of healthy food choices with portion control discussed. Encouraged to start a food diary, count calories and to consider  joining a support group. Sample diet sheets offered. Goals set by the patient for the next several months.   Weight /BMI 10/28/2017 08/20/2017 06/10/2017  WEIGHT 182 lb 185 lb 9.6 oz 184 lb  HEIGHT 5\' 2"  5\' 2"  5\' 2"   BMI 33.29 kg/m2 33.95 kg/m2 33.65 kg/m2

## 2017-11-01 NOTE — Assessment & Plan Note (Signed)
Controlled, no change in medication  

## 2017-11-01 NOTE — Assessment & Plan Note (Signed)
The increased risk of cardiovascular disease associated with this diagnosis, and the need to consistently work on lifestyle to change this is discussed. Following  a  heart healthy diet ,commitment to 30 minutes of exercise at least 5 days per week, as well as control of blood sugar and cholesterol , and achieving a healthy weight are all the areas to be addressed .  

## 2017-11-04 ENCOUNTER — Ambulatory Visit: Payer: PPO

## 2017-11-04 ENCOUNTER — Ambulatory Visit: Payer: Self-pay

## 2017-11-04 VITALS — BP 134/78

## 2017-11-04 DIAGNOSIS — I10 Essential (primary) hypertension: Secondary | ICD-10-CM

## 2017-11-05 NOTE — Progress Notes (Signed)
Advised to continue the same meds and keep next appt

## 2017-11-16 ENCOUNTER — Telehealth: Payer: Self-pay | Admitting: Family Medicine

## 2017-11-16 NOTE — Telephone Encounter (Signed)
Pt is coming by as she is hurting in her back  As of 11-15-17--can you send in Prednisone, or does she need an appt.

## 2017-11-17 ENCOUNTER — Other Ambulatory Visit: Payer: Self-pay | Admitting: Family Medicine

## 2017-11-17 MED ORDER — PREDNISONE 5 MG (21) PO TBPK: 5.0000 mg | ORAL_TABLET | ORAL | 0 refills | Status: DC

## 2017-11-17 MED ORDER — RANITIDINE HCL 300 MG PO TABS: 300.0000 mg | ORAL_TABLET | Freq: Every day | ORAL | 0 refills | Status: DC

## 2017-11-17 MED ORDER — IBUPROFEN 600 MG PO TABS: ORAL_TABLET | ORAL | 0 refills | Status: DC

## 2017-11-17 NOTE — Telephone Encounter (Signed)
States you seen her for a flare of lower back pain last year. Lower back has been hurting since Sunday. Ibuprofen not helping. Wanting some prednisone called in for it. Please advise

## 2017-11-17 NOTE — Telephone Encounter (Signed)
Called for more information and asked her to call back

## 2017-11-17 NOTE — Telephone Encounter (Signed)
Zantac, ibuprofen and prednisone prescribed , she will call in 2 days for injections if not much better

## 2017-11-18 ENCOUNTER — Ambulatory Visit (INDEPENDENT_AMBULATORY_CARE_PROVIDER_SITE_OTHER): Payer: PPO

## 2017-11-18 ENCOUNTER — Telehealth: Payer: Self-pay

## 2017-11-18 DIAGNOSIS — M79604 Pain in right leg: Secondary | ICD-10-CM | POA: Diagnosis not present

## 2017-11-18 MED ORDER — KETOROLAC TROMETHAMINE 60 MG/2ML IM SOLN
60.0000 mg | Freq: Once | INTRAMUSCULAR | Status: AC
  Administered 2017-11-18: 60 mg via INTRAMUSCULAR

## 2017-11-18 MED ORDER — METHYLPREDNISOLONE ACETATE 80 MG/ML IJ SUSP
80.0000 mg | Freq: Once | INTRAMUSCULAR | Status: AC
  Administered 2017-11-18: 80 mg via INTRAMUSCULAR

## 2017-11-18 NOTE — Telephone Encounter (Signed)
Noted  

## 2017-11-18 NOTE — Telephone Encounter (Signed)
I sent you a new message

## 2017-11-18 NOTE — Telephone Encounter (Signed)
Walked in requesting injections

## 2017-11-22 ENCOUNTER — Other Ambulatory Visit: Payer: Self-pay | Admitting: Cardiovascular Disease

## 2017-11-23 NOTE — Telephone Encounter (Signed)
REFILL 

## 2017-11-27 DIAGNOSIS — D509 Iron deficiency anemia, unspecified: Secondary | ICD-10-CM | POA: Diagnosis not present

## 2017-11-27 DIAGNOSIS — R809 Proteinuria, unspecified: Secondary | ICD-10-CM | POA: Diagnosis not present

## 2017-11-27 DIAGNOSIS — N183 Chronic kidney disease, stage 3 (moderate): Secondary | ICD-10-CM | POA: Diagnosis not present

## 2017-11-27 DIAGNOSIS — E559 Vitamin D deficiency, unspecified: Secondary | ICD-10-CM | POA: Diagnosis not present

## 2017-12-02 DIAGNOSIS — D649 Anemia, unspecified: Secondary | ICD-10-CM | POA: Diagnosis not present

## 2017-12-02 DIAGNOSIS — E559 Vitamin D deficiency, unspecified: Secondary | ICD-10-CM | POA: Diagnosis not present

## 2017-12-02 DIAGNOSIS — N183 Chronic kidney disease, stage 3 (moderate): Secondary | ICD-10-CM | POA: Diagnosis not present

## 2017-12-02 DIAGNOSIS — E669 Obesity, unspecified: Secondary | ICD-10-CM | POA: Diagnosis not present

## 2017-12-07 ENCOUNTER — Encounter: Payer: Self-pay | Admitting: Cardiovascular Disease

## 2017-12-07 ENCOUNTER — Ambulatory Visit: Payer: PPO | Admitting: Cardiovascular Disease

## 2017-12-07 VITALS — BP 122/72 | HR 53 | Ht 62.0 in | Wt 184.0 lb

## 2017-12-07 DIAGNOSIS — I712 Thoracic aortic aneurysm, without rupture: Secondary | ICD-10-CM

## 2017-12-07 DIAGNOSIS — E669 Obesity, unspecified: Secondary | ICD-10-CM

## 2017-12-07 DIAGNOSIS — I10 Essential (primary) hypertension: Secondary | ICD-10-CM

## 2017-12-07 DIAGNOSIS — E7800 Pure hypercholesterolemia, unspecified: Secondary | ICD-10-CM

## 2017-12-07 DIAGNOSIS — I358 Other nonrheumatic aortic valve disorders: Secondary | ICD-10-CM

## 2017-12-07 DIAGNOSIS — N183 Chronic kidney disease, stage 3 unspecified: Secondary | ICD-10-CM

## 2017-12-07 DIAGNOSIS — I7121 Aneurysm of the ascending aorta, without rupture: Secondary | ICD-10-CM

## 2017-12-07 NOTE — Progress Notes (Signed)
Cardiology Office Note    Date:  12/07/2017   ID:  Rana, Hochstein 1946/04/22, MRN [id number]  PCP:  [name], MD  Cardiologist:   [name], MD   Chief Complaint  Patient presents with  . Follow-up    12 months    History of Present Illness:  Desiree [name] is a 72 y.o. female with a moderate size asymptomatic aneurysm of the aorta (fusiform 4.3 cm, stable since 2009), Reported history of ischemic stroke (right hemisphere by CT of the head 2010) without neurological deficits, moderate chronic kidney disease, hypertension, hyperlipidemia. She saw Dr. Servando Snare in follow-up for her aortic aneurysm in December 2018, no change in aneurysm size. Sees Dr. Lowanda Foster in [place] for renal insufficiency. She has not had any interim serious medical problems.  The patient specifically denies any chest pain at rest exertion, dyspnea at rest or with exertion, orthopnea, paroxysmal nocturnal dyspnea, syncope, palpitations, focal neurological deficits, intermittent claudication, lower extremity edema, unexplained weight gain, cough, hemoptysis or wheezing.  Her aortic stenosis murmur sounds a little louder on exam today and she is a little bradycardic.  In 2014 her echocardiogram showed a borderline stenotic aortic valve (peak gradient 17, mean gradient 9, dimensionless index 0.5).  Past Medical History:  Diagnosis Date  . Allergic rhinitis   . Arthritis HIPS/ WRISTS  . DDD (degenerative disc disease), cervical   . GERD (gastroesophageal reflux disease) OCCASIONAL  . Heart murmur ASYMPTOMATIC  . History of Bell's palsy 2011  RIGHT SIDE -- RESOLVED  . 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 History of CVA (cerebrovascular accident) PER SCAN IN 2011  . Hyperlipidemia   . Hypertension   . Impaired fasting glucose PER PCP  DR SIMPSON   WATCH DIET  . Mixed urge and stress incontinence   . Nocturia   .  Sinus drainage   . Thoracic ascending aortic aneurysm ([place]) LAST CHEST CT 09-02-2010--  FOLLOWED BY  CARDIOLOGIST--  DR WUXLKGMW  . Vaginal wall prolapse     Past Surgical History:  Procedure Laterality Date  . BILATERAL CARPAL TUNNEL RELEASE  1980'S  . CARDIAC CATHETERIZATION  02-01-2009  DR EICHHORN   NORMAL CORONARY ARTERIES/ NORMAL LV SIZE AND FUNCTION/ AORTIC ROOT SIZE AT THE UPPER LIMIT OF NORMAL   . CARPAL TUNNEL RELEASE    . CERVICAL DISC SURGERY  12-12-2005  DR Vertell Limber   LEFT  C6 - C7 HERINATED / DDD/ SPONDYLOSIS  . COLONOSCOPY N/A 02/10/2014   Procedure: COLONOSCOPY;  Surgeon: Danie Binder, MD;  [facility];  Service: Endoscopy;  Laterality: N/A;  8:30  . CYSTOCELE REPAIR  08/02/2012   Procedure: ANTERIOR REPAIR (CYSTOCELE);  Surgeon: Ailene Rud, MD;  Location[facility];  Service: Urology;  Laterality: N/A;  [employer] Uphold Anterior Pelvic Floor Sacrospinus Repair. Anterior wall of the vagina.  . ESOPHAGOGASTRODUODENOSCOPY N/A 02/10/2014   Procedure: ESOPHAGOGASTRODUODENOSCOPY (EGD);  Surgeon: Danie Binder, MD;  Location: [facility];  Service: Endoscopy;  Laterality: N/A;  . GIVENS CAPSULE STUDY N/A 03/13/2014   Procedure: GIVENS CAPSULE STUDY;  Surgeon: Danie Binder, MD;  Location: [facility];  Service: Endoscopy;  Laterality: N/A;  7:30  . NECK SURGERY  2007  . RIGHT PARTIAL MASTECTOMY  11-11-2004  DR [name] YOUNG   DUCTAL CARCINOMA IN SITU RIGHT BREAST  . TOTAL ABDOMINAL HYSTERECTOMY W/ BILATERAL SALPINGOOPHORECTOMY  [place] ECHOCARDIOGRAM  03-30-2008  DR MARGARET SIMPSON   LV SIZE AND FUNCTION NORMAL/ MODERATE AORTIC ARCH DILATATION/ MILD MR    Current Medications: Outpatient Medications Prior to Visit  Medication Sig Dispense Refill  . acetaminophen (TYLENOL) 650 MG CR tablet Take 650 mg by mouth every 8 (eight) hours as needed for pain.    . Ascorbic Acid (VITAMIN C) 1000 MG tablet Take 1,000 mg by  mouth daily.    Marland Kitchen aspirin EC 325 MG tablet Take 1 tablet (325 mg total) by mouth daily. 100 tablet 3  . calcium-vitamin D (OSCAL 500/200 D-3) 500-200 MG-UNIT per tablet Take 1 tablet by mouth daily with breakfast.     . carvedilol (COREG) 3.125 MG tablet TAKE 1 TABLET BY MOUTH TWICE DAILY WITH A MEAL 180 tablet 0  . cholecalciferol (VITAMIN D) 1000 UNITS tablet Take 1,000 Units by mouth daily.    Marland Kitchen gabapentin (NEURONTIN) 100 MG capsule TAKE TWO CAPSULES BY MOUTH ONCE DAILY AT BEDTIME 180 capsule 3  . hydrochlorothiazide (HYDRODIURIL) 25 MG tablet TAKE ONE TABLET BY MOUTH ONCE DAILY IN THE MORNING 90 tablet 1  . ibuprofen (ADVIL,MOTRIN) 600 MG tablet One tablet one twice daily for 1 week 14 tablet 0  . losartan (COZAAR) 50 MG tablet TAKE ONE TABLET BY MOUTH ONCE DAILY 90 tablet 1  . lovastatin (MEVACOR) 40 MG tablet TAKE 1 TABLET BY MOUTH ONCE DAILY WITH BREAKFAST 90 tablet 1  . montelukast (SINGULAIR) 10 MG tablet Take 1 tablet (10 mg total) by mouth at bedtime. 90 tablet 1  . Multiple Vitamin (MULTIVITAMIN) tablet Take 1 tablet by mouth daily.    . solifenacin (VESICARE) 10 MG tablet Take 10 mg by mouth daily.    . betamethasone dipropionate (DIPROLENE) 0.05 % cream Apply topically 2 (two) times daily. Apply sparingly twice daily to affected area for 5 days, then as needed 45 g 0  . meclizine (ANTIVERT) 12.5 MG tablet Take 1 tablet (12.5 mg total) by mouth 3 (three) times daily as needed for dizziness. 30 tablet 0  . predniSONE (STERAPRED UNI-PAK 21 TAB) 5 MG (21) TBPK tablet Take 1 tablet (5 mg total) by mouth as directed. Use as directed 21 tablet 0  . ranitidine (ZANTAC) 300 MG tablet Take 1 tablet (300 mg total) by mouth at bedtime. 14 tablet 0   No facility-administered medications prior to visit.      Allergies:   Codeine   Social History   Socioeconomic History  . Marital status: Divorced    Spouse name: Not on file  . Number of children: 4  . Years of education: Not on file  .  Highest education level: Not on file  Occupational History  . Not on file  Social Needs  . Financial resource strain: Not on file  . Food insecurity:    Worry: Not on file    Inability: Not on file  . Transportation needs:    Medical: Not on file    Non-medical: Not on file  Tobacco Use  . Smoking status: Never Smoker  . Smokeless tobacco: Never Used  Substance and Sexual Activity  . Alcohol use: No  . Drug use: No  . Sexual activity: Not Currently  Lifestyle  . Physical activity:    Days per week: Not on file    Minutes per session: Not on file  . Stress: Not on file  Relationships  . Social connections:    Talks on phone: Not on file  Gets together: Not on file    Attends religious service: Not on file    Active member of club or organization: Not on file    Attends meetings of clubs or organizations: Not on file    Relationship status: Not on file  Other Topics Concern  . Not on file  Social History Narrative   RETIRED FROM [employer]. 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     Family History:  The patient's family history includes Breast cancer in her sister; Heart attack (age of onset: 30) in her mother; Hypertension in her brother and mother; Kidney failure in her mother; Lung cancer (age of onset: 76) in her father; Prostate cancer in her father; Throat cancer in her father.   ROS:   Please see the history of present illness.    ROS All other systems reviewed and are negative.   PHYSICAL EXAM:   VS:  BP 122/72 (BP Location: Left Arm, Patient Position: Sitting, Cuff Size: Large)   Pulse (!) 53   Ht 5\' 2"  (1.575 m)   Wt 184 lb (83.5 kg)   BMI 33.65 kg/m     General: Alert, oriented x3, no distress, appears well Head: no evidence of trauma, PERRL, EOMI, no exophtalmos or lid lag, no myxedema, no xanthelasma; normal ears, nose and oropharynx Neck: normal jugular venous pulsations and no hepatojugular reflux; brisk carotid pulses without delay and no carotid bruits Chest:  clear to auscultation, no signs of consolidation by percussion or palpation, normal fremitus, symmetrical and full respiratory excursions Cardiovascular: normal position and quality of the apical impulse, regular rhythm, normal first and second heart sounds, 3/6 early peaking systolic ejection murmur in the aortic focus no diastolic murmurs, rubs or gallops.  Systolic murmur does not change with provocative maneuvers. Abdomen: no tenderness or distention, no masses by palpation, no abnormal pulsatility or arterial bruits, normal bowel sounds, no hepatosplenomegaly Extremities: no clubbing, cyanosis or edema; 2+ radial, ulnar and brachial pulses bilaterally; 2+ right femoral, posterior tibial and dorsalis pedis pulses; 2+ left femoral, posterior tibial and dorsalis pedis pulses; no subclavian or femoral bruits Neurological: grossly nonfocal Psych: Normal mood and affect   Wt Readings from Last 3 Encounters:  12/07/17 184 lb (83.5 kg)  10/28/17 182 lb (82.6 kg)  08/20/17 185 lb 9.6 oz (84.2 kg)      Studies/Labs Reviewed:   EKG:  EKG is ordered today.  The ekg ordered today demonstrates sinus bradycardia at 53 bpm, otherwise normal tracing with QT 428 ms  Recent Labs: 02/12/2017: Hemoglobin 11.2; Platelets 252 10/26/2017: ALT 12; BUN 18; Creat 1.15; Potassium 3.7; Sodium 142; TSH 4.13   Lipid Panel    Component Value Date/Time   CHOL 170 10/26/2017 0712   TRIG 47 10/26/2017 0712   HDL 79 10/26/2017 0712   CHOLHDL 2.2 10/26/2017 0712   VLDL 7 12/06/2016 0801   LDLCALC 78 10/26/2017 0712    Additional studies/ records that were reviewed today include:  Notes from Dr. Servando Snare    ASSESSMENT:    1. Ascending aortic aneurysm ([place])   2. Aortic systolic murmur on examination   3. Essential hypertension   4. Mild obesity   5. Chronic kidney disease, stage 3 ([place])   6. Hypercholesterolemia      PLAN:  In order of problems listed above:  1. Asc Ao aneurysm: Asymptomatic, stable  in size for many years on beta-blocker and angiotensin receptor blocker.  She is somewhat bradycardic, but prefer to keep her on beta-blocker  due to the aneurysm. 2. Ao murmur: On previous echoes she did have a report of intermittent left ventricular outflow tract obstruction.  It is prominent today, although she is asymptomatic.  The murmur now sounds more like fixed valvular aortic stenosis..  Suspect that she may have progressed to true aortic stenosis, but she has no symptoms whatsoever.  We will plan an echocardiogram if she becomes symptomatic.  Warned her to call us for exertional angina, dyspnea or syncope. 3. HTN: Excellent control 4. Obesity: Weight loss would be beneficial. 5. CKD: Nephrologist is Dr. Lowanda Foster.  Stable renal parameters 6. HLP: LDL is very close to target     Medication Adjustments/Labs and Tests Ordered: Current medicines are reviewed at length with the patient today.  Concerns regarding medicines are outlined above.  Medication changes, Labs and Tests ordered today are listed in the Patient Instructions below. There are no Patient Instructions on file for this visit.   Signed, [name], MD  12/07/2017 3:14 PM    [facility] [place], [place], [place]  [address] Phone: [phone]; Fax: [phone]

## 2017-12-07 NOTE — Patient Instructions (Signed)
Dr Croitoru recommends that you schedule a follow-up appointment in 12 months. You will receive a reminder letter in the mail two months in advance. If you don't receive a letter, please call our office to schedule the follow-up appointment.  If you need a refill on your cardiac medications before your next appointment, please call your pharmacy. 

## 2017-12-09 ENCOUNTER — Encounter: Payer: Self-pay | Admitting: Cardiovascular Disease

## 2018-01-19 ENCOUNTER — Ambulatory Visit (HOSPITAL_COMMUNITY): Payer: Self-pay

## 2018-01-21 ENCOUNTER — Encounter (HOSPITAL_COMMUNITY): Payer: Self-pay

## 2018-01-21 ENCOUNTER — Ambulatory Visit (HOSPITAL_COMMUNITY)
Admission: RE | Admit: 2018-01-21 | Discharge: 2018-01-21 | Disposition: A | Discharge status: Home / Self Care | Payer: PPO | Source: Ambulatory Visit | Attending: Family Medicine | Admitting: Family Medicine

## 2018-01-21 DIAGNOSIS — Z1231 Encounter for screening mammogram for malignant neoplasm of breast: Secondary | ICD-10-CM | POA: Insufficient documentation

## 2018-02-08 ENCOUNTER — Telehealth: Payer: Self-pay | Admitting: Family Medicine

## 2018-02-08 ENCOUNTER — Encounter: Payer: Self-pay | Admitting: Family Medicine

## 2018-02-08 ENCOUNTER — Ambulatory Visit (INDEPENDENT_AMBULATORY_CARE_PROVIDER_SITE_OTHER): Payer: PPO | Admitting: Family Medicine

## 2018-02-08 VITALS — BP 120/72 | HR 56 | Temp 98.6°F | Resp 16 | Ht 62.0 in | Wt 183.0 lb

## 2018-02-08 DIAGNOSIS — E669 Obesity, unspecified: Secondary | ICD-10-CM

## 2018-02-08 DIAGNOSIS — M1711 Unilateral primary osteoarthritis, right knee: Secondary | ICD-10-CM

## 2018-02-08 DIAGNOSIS — G4489 Other headache syndrome: Secondary | ICD-10-CM | POA: Diagnosis not present

## 2018-02-08 DIAGNOSIS — R52 Pain, unspecified: Secondary | ICD-10-CM

## 2018-02-08 DIAGNOSIS — I10 Essential (primary) hypertension: Secondary | ICD-10-CM

## 2018-02-08 MED ORDER — PREDNISONE 5 MG (21) PO TBPK: 5.0000 mg | ORAL_TABLET | ORAL | 0 refills | Status: DC

## 2018-02-08 NOTE — Telephone Encounter (Signed)
Work in today

## 2018-02-08 NOTE — Telephone Encounter (Signed)
Patient called in requesting an appt, states she has pain in her ear radiating into her neck/shoulder on the right side. Her head is throbbing so bad it hurts to comb her hair Cb#  [phone]/ [phone]

## 2018-02-08 NOTE — Patient Instructions (Addendum)
F/u in Sept but change to you convenience, call if you need me sooner  CBC and diff and ESR today  Work excuse  To return on Wednesday  Prednisone sent  In and you will be referred to [facility] in [place] re right knee arthritis   No signe of infection, you are being treated for pain due to inflammation

## 2018-02-08 NOTE — Progress Notes (Signed)
Desiree [name]     MRN: [id number]      DOB: 1945-11-21   HPI Desiree [name] is here c/o acute onset of right sided of right sided  Earache  4 days agoand later that dy she develped severe right facial pain , neck, both shoulders right more than left , pain was shooting rated at a 10. Had chills  And sweating , no fever, , no nausea, no nasal drainage, thick white sputum, pain now down to a 4 4 month h/o RLE pain and near falls with cracking and popping of knee, multiple near falls, no cane and does not want one ROS See HPI  Denies chest pains, palpitations and leg swelling Denies abdominal pain, nausea, vomiting,diarrhea or constipation.   Denies dysuria, frequency, hesitancy or incontinence.  Denies , seizures,  Denies depression,has  anxiety with symptom onset and also insomnia because of pain on the right side of her head with direct pressure Denies skin break down or rash.   PE  BP 120/72   Pulse (!) 56   Temp 98.6 F (37 C) (Oral)   Resp 16   Ht 5' 2"  (1.575 m)   Wt 183 lb (83 kg)   SpO2 96%   BMI 33.47 kg/m    Patient alert and oriented and in no cardiopulmonary distress.Looks anxious and  uncomfortable  HEENT: No facial asymmetry, EOMI,   oropharynx pink and moist.  Neck supple no JVD, no mass.TM clear good light reflex bilaterally, no cervical adenopathy Tender over right temporal bone  Chest: Clear to auscultation bilaterally.  CVS: S1, S2 no murmurs, no S3.Regular rate.  ABD: Soft non tender.   Ext: No edema  MS: Adequate ROM spine, shoulders, hips and  Reduced in right knee with crepitus and mildldeformity.  Skin: Intact, no ulcerations or rash noted.  Psych: Good eye contact, normal affect. Memory intact t anxious not  depressed appearing.  CNS: CN 2-12 intact, power,  normal throughout.no focal deficits noted.   Assessment & Plan  Headache Acute severe headache in  Pt with established thoracic spine disease, some suggestion may have temporal  arteritis component, ESR checked and is normal. Normal neurologic exam and pain is lessened significantly from when initially started, pt treated with prednisone burst  Arthritis of knee, right Pt report pain , and instability, needs orthopedic evaluation, will refer to in network provider locally  Essential hypertension Controlled, no change in medication DASH diet and commitment to daily physical activity for a minimum of 30 minutes discussed and encouraged, as a part of hypertension management. The importance of attaining a healthy weight is also discussed.  BP/Weight 02/08/2018 12/07/2017 11/04/2017 10/28/2017 08/20/2017 06/10/2017 53/09/9922  Systolic BP 268 341 962 229 798 921 194  Diastolic BP 72 72 78 90 78 80 86  Wt. (Lbs) 183 184 - 182 185.6 184 184  BMI 33.47 33.65 - 33.29 33.95 33.65 33.65       Obesity (BMI 30-39.9) UInchanged. Patient re-educated about  the importance of commitment to a  minimum of 150 minutes of exercise per week.  The importance of healthy food choices with portion control discussed. Encouraged to start a food diary, count calories and to consider  joining a support group. Sample diet sheets offered. Goals set by the patient for the next several months.   Weight /BMI 02/08/2018 12/07/2017 10/28/2017  WEIGHT 183 lb 184 lb 182 lb  HEIGHT 5' 2"  5' 2"  5' 2"   BMI 33.47 kg/m2 33.65 kg/m2 33.29  kg/m2

## 2018-02-09 ENCOUNTER — Encounter: Payer: Self-pay | Admitting: Family Medicine

## 2018-02-09 DIAGNOSIS — R519 Headache, unspecified: Secondary | ICD-10-CM | POA: Insufficient documentation

## 2018-02-09 DIAGNOSIS — R51 Headache: Secondary | ICD-10-CM

## 2018-02-09 LAB — CBC WITH DIFFERENTIAL/PLATELET
BASOS PCT: 1.2 %
Basophils Absolute: 49 cells/uL (ref 0–200)
EOS PCT: 3.4 %
Eosinophils Absolute: 139 cells/uL (ref 15–500)
HCT: 34.6 % — ABNORMAL LOW (ref 35.0–45.0)
Hemoglobin: 11.7 g/dL (ref 11.7–15.5)
Lymphs Abs: 1456 cells/uL (ref 850–3900)
MCH: 28.9 pg (ref 27.0–33.0)
MCHC: 33.8 g/dL (ref 32.0–36.0)
MCV: 85.4 fL (ref 80.0–100.0)
MONOS PCT: 9.2 %
MPV: 9.5 fL (ref 7.5–12.5)
Neutro Abs: 2079 cells/uL (ref 1500–7800)
Neutrophils Relative %: 50.7 %
PLATELETS: 313 10*3/uL (ref 140–400)
RBC: 4.05 10*6/uL (ref 3.80–5.10)
RDW: 13.2 % (ref 11.0–15.0)
TOTAL LYMPHOCYTE: 35.5 %
WBC: 4.1 10*3/uL (ref 3.8–10.8)
WBCMIX: 377 {cells}/uL (ref 200–950)

## 2018-02-09 LAB — SEDIMENTATION RATE: SED RATE: 31 mm/h — AB (ref 0–30)

## 2018-02-09 NOTE — Assessment & Plan Note (Signed)
Pt report pain , and instability, needs orthopedic evaluation, will refer to in network provider locally

## 2018-02-09 NOTE — Assessment & Plan Note (Signed)
Controlled, no change in medication DASH diet and commitment to daily physical activity for a minimum of 30 minutes discussed and encouraged, as a part of hypertension management. The importance of attaining a healthy weight is also discussed.  BP/Weight 02/08/2018 12/07/2017 11/04/2017 10/28/2017 08/20/2017 06/10/2017 78/11/6960  Systolic BP 952 841 324 401 027 253 664  Diastolic BP 72 72 78 90 78 80 86  Wt. (Lbs) 183 184 - 182 185.6 184 184  BMI 33.47 33.65 - 33.29 33.95 33.65 33.65

## 2018-02-09 NOTE — Assessment & Plan Note (Signed)
Acute severe headache in  Pt with established thoracic spine disease, some suggestion may have temporal arteritis component, ESR checked and is normal. Normal neurologic exam and pain is lessened significantly from when initially started, pt treated with prednisone burst

## 2018-02-09 NOTE — Assessment & Plan Note (Signed)
UInchanged. Patient re-educated about  the importance of commitment to a  minimum of 150 minutes of exercise per week.  The importance of healthy food choices with portion control discussed. Encouraged to start a food diary, count calories and to consider  joining a support group. Sample diet sheets offered. Goals set by the patient for the next several months.   Weight /BMI 02/08/2018 12/07/2017 10/28/2017  WEIGHT 183 lb 184 lb 182 lb  HEIGHT 5\' 2"  5\' 2"  5\' 2"   BMI 33.47 kg/m2 33.65 kg/m2 33.29 kg/m2

## 2018-02-18 ENCOUNTER — Ambulatory Visit (INDEPENDENT_AMBULATORY_CARE_PROVIDER_SITE_OTHER): Payer: PPO | Admitting: Orthopaedic Surgery

## 2018-02-21 ENCOUNTER — Other Ambulatory Visit: Payer: Self-pay | Admitting: Cardiovascular Disease

## 2018-02-22 NOTE — Telephone Encounter (Signed)
Rx(s) sent to pharmacy electronically.  

## 2018-03-04 ENCOUNTER — Encounter (INDEPENDENT_AMBULATORY_CARE_PROVIDER_SITE_OTHER): Payer: Self-pay | Admitting: Orthopaedic Surgery

## 2018-03-04 ENCOUNTER — Ambulatory Visit (INDEPENDENT_AMBULATORY_CARE_PROVIDER_SITE_OTHER): Payer: PPO

## 2018-03-04 ENCOUNTER — Ambulatory Visit (INDEPENDENT_AMBULATORY_CARE_PROVIDER_SITE_OTHER): Payer: PPO | Admitting: Orthopaedic Surgery

## 2018-03-04 VITALS — BP 141/83 | HR 61 | Ht 61.5 in | Wt 185.0 lb

## 2018-03-04 DIAGNOSIS — M25561 Pain in right knee: Secondary | ICD-10-CM

## 2018-03-04 DIAGNOSIS — G8929 Other chronic pain: Secondary | ICD-10-CM | POA: Diagnosis not present

## 2018-03-09 ENCOUNTER — Encounter (INDEPENDENT_AMBULATORY_CARE_PROVIDER_SITE_OTHER): Payer: Self-pay | Admitting: Orthopaedic Surgery

## 2018-03-09 DIAGNOSIS — G8929 Other chronic pain: Secondary | ICD-10-CM

## 2018-03-09 DIAGNOSIS — M25561 Pain in right knee: Secondary | ICD-10-CM | POA: Diagnosis not present

## 2018-03-09 MED ORDER — METHYLPREDNISOLONE ACETATE 40 MG/ML IJ SUSP
40.0000 mg | INTRAMUSCULAR | Status: AC | PRN
  Administered 2018-03-09: 40 mg via INTRA_ARTICULAR

## 2018-03-09 MED ORDER — BUPIVACAINE HCL 0.25 % IJ SOLN
4.0000 mL | INTRAMUSCULAR | Status: AC | PRN
  Administered 2018-03-09: 4 mL via INTRA_ARTICULAR

## 2018-03-09 MED ORDER — LIDOCAINE HCL 1 % IJ SOLN
0.5000 mL | INTRAMUSCULAR | Status: AC | PRN
  Administered 2018-03-09: .5 mL

## 2018-03-09 NOTE — Progress Notes (Signed)
Office Visit Note   Patient: Desiree [name]           Date of Birth: Dec 11, 1945           MRN: [id number] Visit Date: 03/04/2018              Requested by: [name], [address], [place], [place] [address] PCP: [name], MD   Assessment & Plan: Visit Diagnoses:  1. Chronic pain of right knee     Plan: Right knee injection performed which she tolerated well.  We will check her back in 2 months.  We discussed surgical treatment options for her severe knee osteoarthritis.  Follow-Up Instructions: Return in about 2 months (around 05/05/2018).   Orders:  Orders Placed This Encounter  Procedures  . XR KNEE 3 VIEW RIGHT   No orders of the defined types were placed in this encounter.     Procedures: Large Joint Inj: R knee on 03/09/2018 1:05 PM Indications: pain and joint swelling Details: 22 G 1.5 in needle, anterolateral approach  Arthrogram: No  Medications: 40 mg methylPREDNISolone acetate 40 MG/ML; 0.5 mL lidocaine 1 %; 4 mL bupivacaine 0.25 % Outcome: tolerated well, no immediate complications Procedure, treatment alternatives, risks and benefits explained, specific risks discussed. Consent was given by the patient. Immediately prior to procedure a time out was called to verify the correct patient, procedure, equipment, support staff and site/side marked as required. Patient was prepped and draped in the usual sterile fashion.       Clinical Data: No additional findings.   Subjective: Chief Complaint  Patient presents with  . Right Knee - Pain    HPI 72 year old female seen with right knee pain that radiates down from her knee almost to her ankle.  She notes when she turns her foot out and walks she has popping in her knee and states is difficult for her to walk.  She states when the knee has she has to grab something to avoid falling.  She is used some quadriceps exercises, liniments, Tylenol arthritis and also ibuprofen 800 mg  without relief.  She is noted some swelling in her knee with pain with ambulation.  She denies associated groin pain no associated back pain.  Review of Systems positive for bright breast cancer, hypertension, bladder prolapse, obesity, anemia, metabolic syndrome X, incontinence, rhinitis and hyperlipidemia, headaches and obesity.  Otherwise 14 point negative as it pertains HPI.   Objective: Vital Signs: BP (!) 141/83   Pulse 61   Ht 5' 1.5" (1.562 m)   Wt 185 lb (83.9 kg)   BMI 34.39 kg/m   Physical Exam  Constitutional: She is oriented to person, place, and time. She appears well-developed.  HENT:  Head: Normocephalic.  Right Ear: External ear normal.  Left Ear: External ear normal.  Eyes: Pupils are equal, round, and reactive to light.  Neck: No tracheal deviation present. No thyromegaly present.  Cardiovascular: Normal rate.  Pulmonary/Chest: Effort normal.  Abdominal: Soft.  Neurological: She is alert and oriented to person, place, and time.  Skin: Skin is warm and dry.  Psychiatric: She has a normal mood and affect. Her behavior is normal.    Ortho Exam patient has right knee valgus deformity with tenderness and palpable lateral osteophytes.  Tell femoral crepitus pain with knee extension she lacks a few degrees reaching full extension and can flex 115 degrees.  ACL PCL is normal.  There is some lateral laxity due  to lateral compartment joint wear.  Distal pulses are intact negative logroll to the hips no sciatic notch tenderness.  Specialty Comments:  No specialty comments available.  Imaging: No results found.   PMFS History: Patient Active Problem List   Diagnosis Date Noted  . Headache 02/09/2018  . CKD (chronic kidney disease) stage 3, GFR 30-59 ml/min (HCC) 10/01/2016  . 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 Urinary incontinence 05/22/2014  . Metabolic syndrome X 16/05/9603  . Osteopenia 12/12/2013  . Anemia 11/29/2013  . Arthritis of  knee, right 02/21/2013  . Obesity (BMI 30-39.9) 10/17/2012  . Thoracic aortic aneurysm ([place]) 10/12/2012  . Allergic rhinitis 03/02/2012  . BLADDER PROLAPSE 09/24/2010  . NEOPLASM, MALIGNANT, BREAST, RIGHT 02/04/2008  . Hyperlipidemia LDL goal <100 02/04/2008  . Essential hypertension 02/04/2008   Past Medical History:  Diagnosis Date  . Allergic rhinitis   . Arthritis HIPS/ WRISTS  . DDD (degenerative disc disease), cervical   . GERD (gastroesophageal reflux disease) OCCASIONAL  . Heart murmur ASYMPTOMATIC  . History of Bell's palsy 2011  RIGHT SIDE -- RESOLVED  . 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 History of CVA (cerebrovascular accident) PER SCAN IN 2011  . Hyperlipidemia   . Hypertension   . Impaired fasting glucose PER PCP  DR SIMPSON   WATCH DIET  . Mixed urge and stress incontinence   . Nocturia   . Sinus drainage   . Thoracic ascending aortic aneurysm ([place]) LAST CHEST CT 09-02-2010--  FOLLOWED BY  CARDIOLOGIST--  DR [id number]  . Vaginal wall prolapse     Family History  Problem Relation Age of Onset  . Kidney failure Mother   . Hypertension Mother   . Heart attack Mother 64  . Throat cancer Father   . Prostate cancer Father   . Lung cancer Father 61  . Breast cancer Sister   . Hypertension Brother        2 stents  . Colon cancer Neg Hx   . Colon polyps Neg Hx     Past Surgical History:  Procedure Laterality Date  . BILATERAL CARPAL TUNNEL RELEASE  1980'S  . CARDIAC CATHETERIZATION  02-01-2009  DR EICHHORN   NORMAL CORONARY ARTERIES/ NORMAL LV SIZE AND FUNCTION/ AORTIC ROOT SIZE AT THE UPPER LIMIT OF NORMAL   . CARPAL TUNNEL RELEASE    . CERVICAL DISC SURGERY  12-12-2005  DR Vertell Limber   LEFT  C6 - C7 HERINATED / DDD/ SPONDYLOSIS  . COLONOSCOPY N/A 02/10/2014   Procedure: COLONOSCOPY;  Surgeon: Danie Binder, MD;  [facility];  Service: Endoscopy;  Laterality: N/A;  8:30  .  CYSTOCELE REPAIR  08/02/2012   Procedure: ANTERIOR REPAIR (CYSTOCELE);  Surgeon: Ailene Rud, MD;  Location[facility];  Service: Urology;  Laterality: N/A;  [employer] Uphold Anterior Pelvic Floor Sacrospinus Repair. Anterior wall of the vagina.  . ESOPHAGOGASTRODUODENOSCOPY N/A 02/10/2014   Procedure: ESOPHAGOGASTRODUODENOSCOPY (EGD);  Surgeon: Danie Binder, MD;  [facility];  Service: Endoscopy;  Laterality: N/A;  . GIVENS CAPSULE STUDY N/A 03/13/2014   Procedure: GIVENS CAPSULE STUDY;  Surgeon: Danie Binder, MD;  [facility];  Service: Endoscopy;  Laterality: N/A;  7:30  . NECK SURGERY  2007  . RIGHT PARTIAL MASTECTOMY  11-11-2004  DR [name] YOUNG   DUCTAL CARCINOMA IN SITU RIGHT BREAST  . TOTAL ABDOMINAL  HYSTERECTOMY W/ BILATERAL SALPINGOOPHORECTOMY  1990  . TRANSTHORACIC ECHOCARDIOGRAM  03-30-2008  DR MARGARET SIMPSON   LV SIZE AND FUNCTION NORMAL/ MODERATE AORTIC ARCH DILATATION/ MILD MR   Social History   Occupational History  . Not on file  Tobacco Use  . Smoking status: Never Smoker  . Smokeless tobacco: Never Used  Substance and Sexual Activity  . Alcohol use: No  . Drug use: No  . Sexual activity: Not Currently

## 2018-04-12 DIAGNOSIS — N3946 Mixed incontinence: Secondary | ICD-10-CM | POA: Diagnosis not present

## 2018-04-27 ENCOUNTER — Other Ambulatory Visit: Payer: Self-pay | Admitting: Family Medicine

## 2018-05-03 ENCOUNTER — Ambulatory Visit: Payer: Self-pay | Admitting: Family Medicine

## 2018-05-03 DIAGNOSIS — H2513 Age-related nuclear cataract, bilateral: Secondary | ICD-10-CM | POA: Diagnosis not present

## 2018-05-03 DIAGNOSIS — H35033 Hypertensive retinopathy, bilateral: Secondary | ICD-10-CM | POA: Diagnosis not present

## 2018-05-03 DIAGNOSIS — H1013 Acute atopic conjunctivitis, bilateral: Secondary | ICD-10-CM | POA: Diagnosis not present

## 2018-05-03 DIAGNOSIS — H25013 Cortical age-related cataract, bilateral: Secondary | ICD-10-CM | POA: Diagnosis not present

## 2018-05-03 LAB — HM DIABETES EYE EXAM

## 2018-05-05 ENCOUNTER — Ambulatory Visit (INDEPENDENT_AMBULATORY_CARE_PROVIDER_SITE_OTHER): Payer: PPO | Admitting: Family Medicine

## 2018-05-05 VITALS — BP 142/82

## 2018-05-05 DIAGNOSIS — E669 Obesity, unspecified: Secondary | ICD-10-CM | POA: Diagnosis not present

## 2018-05-05 DIAGNOSIS — M1711 Unilateral primary osteoarthritis, right knee: Secondary | ICD-10-CM

## 2018-05-05 DIAGNOSIS — Z23 Encounter for immunization: Secondary | ICD-10-CM

## 2018-05-05 DIAGNOSIS — E785 Hyperlipidemia, unspecified: Secondary | ICD-10-CM

## 2018-05-05 DIAGNOSIS — I10 Essential (primary) hypertension: Secondary | ICD-10-CM

## 2018-05-05 NOTE — Patient Instructions (Addendum)
Wellness with nurse due before end of year, in November or after and cologuard testing to be set up at visit   Flu vaccine in office today  Physical exam with MD in February 2020, call if you need me before   Blood  Pressure elevated , need to increase HCTZ to one daily  Please get Fasting lipid, cmp and EGFR at [facility] this Saturday, we will call with result next week  It is important that you exercise regularly at least 30 minutes 5 times a week. If you develop chest pain, have severe difficulty breathing, or feel very tired, stop exercising immediately and seek medical attention

## 2018-05-06 ENCOUNTER — Encounter (INDEPENDENT_AMBULATORY_CARE_PROVIDER_SITE_OTHER): Payer: Self-pay | Admitting: Orthopaedic Surgery

## 2018-05-06 ENCOUNTER — Ambulatory Visit (INDEPENDENT_AMBULATORY_CARE_PROVIDER_SITE_OTHER): Payer: PPO | Admitting: Orthopaedic Surgery

## 2018-05-06 VITALS — BP 139/88 | HR 60 | Ht 61.5 in | Wt 184.0 lb

## 2018-05-06 DIAGNOSIS — M1711 Unilateral primary osteoarthritis, right knee: Secondary | ICD-10-CM | POA: Diagnosis not present

## 2018-05-06 NOTE — Progress Notes (Signed)
Office Visit Note   Patient: Desiree [name]           Date of Birth: 1946/02/26           MRN: [id number] Visit Date: 05/06/2018              Requested by: [name], [address], [place], [place] [address] PCP: [name], MD   Assessment & Plan: Visit Diagnoses:  1. Arthritis of knee, right     Plan: At this point she is doing well with the injection.  She has known tricompartmental degenerative arthritis but the injection is helped her symptoms and she is at this point wants to avoid total knee arthroplasty if possible.  I plan to check her back again in 3 months for recheck.  She starts having significant problems she will call let us know and can return earlier.  Follow-Up Instructions: Return in about 3 months (around 08/05/2018).   Orders:  No orders of the defined types were placed in this encounter.  No orders of the defined types were placed in this encounter.     Procedures: No procedures performed   Clinical Data: No additional findings.   Subjective: Chief Complaint  Patient presents with  . Right Knee - Pain, Follow-up    HPI 72 year old female returns post right knee injection 03/04/2018 with good relief of pain.  Her extreme pain is non-she is walking without a limp she still has popping in her knee and occasionally it stops her for a second or 2 and then she can resume walking.  Not fallen since her injection.  She still is a Hydrographic surveyor.  Review of Systems review of systems positive for right knee osteoarthritis tricompartmental.  Breast CA, right.  Allergic rhinitis, thoracic aortic aneurysm, increased BMI metabolic syndrome X, urinary incontinence, left ventricular out flow tract obstruction, stage III kidney disease otherwise negative as it pertains HPI 14 point review of systems updated.   Objective: Vital Signs: BP 139/88   Pulse 60   Ht 5' 1.5" (1.562 m)   Wt 184 lb (83.5 kg)   BMI 34.20 kg/m    Physical Exam  Constitutional: She is oriented to person, place, and time. She appears well-developed.  HENT:  Head: Normocephalic.  Right Ear: External ear normal.  Left Ear: External ear normal.  Eyes: Pupils are equal, round, and reactive to light.  Neck: No tracheal deviation present. No thyromegaly present.  Cardiovascular: Normal rate.  Pulmonary/Chest: Effort normal.  Abdominal: Soft.  Neurological: She is alert and oriented to person, place, and time.  Skin: Skin is warm and dry.  Psychiatric: She has a normal mood and affect. Her behavior is normal.    Ortho Exam she has crepitus with right knee range of motion swelling is down she is walking much better than before the injection.  With flexion extension she still has popping.  Occasionally with walking she demonstrates in the office she will feel a sharp pain and asked to stop for second and then can resume walking.  Specialty Comments:  No specialty comments available.  Imaging: No results found.   PMFS History: Patient Active Problem List   Diagnosis Date Noted  . Headache 02/09/2018  . CKD (chronic kidney disease) stage 3, GFR 30-59 ml/min (HCC) 10/01/2016  . 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 Urinary incontinence 05/22/2014  . Metabolic syndrome X 34/19/3790  . Osteopenia 12/12/2013  . Anemia 11/29/2013  .  Arthritis of knee, right 02/21/2013  . Obesity (BMI 30-39.9) 10/17/2012  . Thoracic aortic aneurysm ([place]) 10/12/2012  . Allergic rhinitis 03/02/2012  . BLADDER PROLAPSE 09/24/2010  . NEOPLASM, MALIGNANT, BREAST, RIGHT 02/04/2008  . Hyperlipidemia LDL goal <100 02/04/2008  . Essential hypertension 02/04/2008   Past Medical History:  Diagnosis Date  . Allergic rhinitis   . Arthritis HIPS/ WRISTS  . DDD (degenerative disc disease), cervical   . GERD (gastroesophageal reflux disease) OCCASIONAL  . Heart murmur ASYMPTOMATIC  . History of Bell's palsy 2011  RIGHT SIDE -- RESOLVED   . 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 History of CVA (cerebrovascular accident) PER SCAN IN 2011  . Hyperlipidemia   . Hypertension   . Impaired fasting glucose PER PCP  DR SIMPSON   WATCH DIET  . Mixed urge and stress incontinence   . Nocturia   . Sinus drainage   . Thoracic ascending aortic aneurysm ([place]) LAST CHEST CT 09-02-2010--  FOLLOWED BY  CARDIOLOGIST--  DR [id number]  . Vaginal wall prolapse     Family History  Problem Relation Age of Onset  . Kidney failure Mother   . Hypertension Mother   . Heart attack Mother 35  . Throat cancer Father   . Prostate cancer Father   . Lung cancer Father 9  . Breast cancer Sister   . Hypertension Brother        2 stents  . Colon cancer Neg Hx   . Colon polyps Neg Hx     Past Surgical History:  Procedure Laterality Date  . BILATERAL CARPAL TUNNEL RELEASE  1980'S  . CARDIAC CATHETERIZATION  02-01-2009  DR EICHHORN   NORMAL CORONARY ARTERIES/ NORMAL LV SIZE AND FUNCTION/ AORTIC ROOT SIZE AT THE UPPER LIMIT OF NORMAL   . CARPAL TUNNEL RELEASE    . CERVICAL DISC SURGERY  12-12-2005  DR Vertell Limber   LEFT  C6 - C7 HERINATED / DDD/ SPONDYLOSIS  . COLONOSCOPY N/A 02/10/2014   Procedure: COLONOSCOPY;  Surgeon: Danie Binder, MD;  [facility];  Service: Endoscopy;  Laterality: N/A;  8:30  . CYSTOCELE REPAIR  08/02/2012   Procedure: ANTERIOR REPAIR (CYSTOCELE);  Surgeon: Ailene Rud, MD;  Location[facility];  Service: Urology;  Laterality: N/A;  [employer] Uphold Anterior Pelvic Floor Sacrospinus Repair. Anterior wall of the vagina.  . ESOPHAGOGASTRODUODENOSCOPY N/A 02/10/2014   Procedure: ESOPHAGOGASTRODUODENOSCOPY (EGD);  Surgeon: Danie Binder, MD;  [facility];  Service: Endoscopy;  Laterality: N/A;  . GIVENS CAPSULE STUDY N/A 03/13/2014   Procedure: GIVENS CAPSULE STUDY;  Surgeon: Danie Binder, MD;  [facility];  Service: Endoscopy;  Laterality: N/A;  7:30  . NECK SURGERY  2007  . RIGHT PARTIAL MASTECTOMY  11-11-2004  DR [name] YOUNG   DUCTAL CARCINOMA IN SITU RIGHT BREAST  . TOTAL ABDOMINAL HYSTERECTOMY W/ BILATERAL SALPINGOOPHORECTOMY  1990  . TRANSTHORACIC ECHOCARDIOGRAM  03-30-2008  DR MARGARET SIMPSON   LV SIZE AND FUNCTION NORMAL/ MODERATE AORTIC ARCH DILATATION/ MILD MR   Social History   Occupational History  . Not on file  Tobacco Use  . Smoking status: Never Smoker  . Smokeless tobacco: Never Used  Substance and Sexual Activity  . Alcohol use: No  . Drug use: No  . Sexual activity: Not Currently

## 2018-05-08 ENCOUNTER — Encounter: Payer: Self-pay | Admitting: Family Medicine

## 2018-05-08 NOTE — Assessment & Plan Note (Signed)
Controlled, no change in medication DASH diet and commitment to daily physical activity for a minimum of 30 minutes discussed and encouraged, as a part of hypertension management. The importance of attaining a healthy weight is also discussed.  BP/Weight 05/06/2018 05/05/2018 03/04/2018 02/08/2018 12/07/2017 04/22/5620 3/0/8657  Systolic BP 846 962 952 841 324 401 027  Diastolic BP 88 82 83 72 72 78 90  Wt. (Lbs) 184 - 185 183 184 - 182  BMI 34.2 - 34.39 33.47 33.65 - 33.29

## 2018-05-08 NOTE — Assessment & Plan Note (Signed)
Unchanged Patient re-educated about  the importance of commitment to a  minimum of 150 minutes of exercise per week.  The importance of healthy food choices with portion control discussed. Encouraged to start a food diary, count calories and to consider  joining a support group. Sample diet sheets offered. Goals set by the patient for the next several months.   Weight /BMI 05/06/2018 03/04/2018 02/08/2018  WEIGHT 184 lb 185 lb 183 lb  HEIGHT 5' 1.5" 5' 1.5" 5\' 2"   BMI 34.2 kg/m2 34.39 kg/m2 33.47 kg/m2

## 2018-05-08 NOTE — Progress Notes (Signed)
   Desiree [name]     MRN: [id number]      DOB: [birth year]   HPI Desiree [name] is here for follow up and re-evaluation of chronic medical conditions, medication management and review of any available recent lab and radiology data.  Preventive health is updated, specifically  Cancer screening and Immunization.   Reports improved right knee pain though states she still has popping The PT denies any adverse reactions to current medications since the last visit.  C/o inadequate exercise and fatigue , but works daily for an abg 6 hours  ROS Denies recent fever or chills. Denies sinus pressure, nasal congestion, ear pain or sore throat. Denies chest congestion, productive cough or wheezing. Denies chest pains, palpitations and leg swelling Denies abdominal pain, nausea, vomiting,diarrhea or constipation.   Denies dysuria, frequency, hesitancy or incontinence. . Denies headaches, seizures, numbness, or tingling. Denies depression, anxiety or insomnia. Denies skin break down or rash.   PE 5 ft 1.5 in Wt 184 lb, BMI : 34.21  BP (!) 142/82 , pulse 60 , Resp 12  Patient alert and oriented and in no cardiopulmonary distress.  HEENT: No facial asymmetry, EOMI,   oropharynx pink and moist.  Neck supple no JVD, no mass.  Chest: Clear to auscultation bilaterally.  CVS: S1, S2 no murmurs, no S3.Regular rate.  ABD: Soft non tender.   Ext: No edema  MS: Adequate ROM spine, shoulders, hips and reduced in right knee.  Skin: Intact, no ulcerations or rash noted.  Psych: Good eye contact, normal affect. Memory intact not anxious or depressed appearing.  CNS: CN 2-12 intact, power,  normal throughout.no focal deficits noted.   Assessment & Plan  Essential hypertension Controlled, no change in medication DASH diet and commitment to daily physical activity for a minimum of 30 minutes discussed and encouraged, as a part of hypertension management. The importance of attaining a healthy weight  is also discussed.  BP/Weight 05/06/2018 05/05/2018 03/04/2018 02/08/2018 12/07/2017 4/65/0354 01/28/6811  Systolic BP 751 700 174 944 967 591 638  Diastolic BP 88 82 83 72 72 78 90  Wt. (Lbs) 184 - 185 183 184 - 182  BMI 34.2 - 34.39 33.47 33.65 - 33.29       Obesity (BMI 30-39.9) Unchanged Patient re-educated about  the importance of commitment to a  minimum of 150 minutes of exercise per week.  The importance of healthy food choices with portion control discussed. Encouraged to start a food diary, count calories and to consider  joining a support group. Sample diet sheets offered. Goals set by the patient for the next several months.   Weight /BMI 05/06/2018 03/04/2018 02/08/2018  WEIGHT 184 lb 185 lb 183 lb  HEIGHT 5' 1.5" 5' 1.5" 5\' 2"   BMI 34.2 kg/m2 34.39 kg/m2 33.47 kg/m2      Hyperlipidemia LDL goal <100 Hyperlipidemia:Low fat diet discussed and encouraged.   Lipid Panel  Lab Results  Component Value Date   CHOL 170 10/26/2017   HDL 79 10/26/2017   LDLCALC 78 10/26/2017   TRIG 47 10/26/2017   CHOLHDL 2.2 10/26/2017   At foal, controlled    Arthritis of knee, right Improved pain , though still  Pops , will see ortho today, encouraged to be more consistent with exercise and work on weight loss

## 2018-05-08 NOTE — Assessment & Plan Note (Signed)
Improved pain , though still  Pops , will see ortho today, encouraged to be more consistent with exercise and work on weight loss

## 2018-05-08 NOTE — Assessment & Plan Note (Signed)
Hyperlipidemia:Low fat diet discussed and encouraged.   Lipid Panel  Lab Results  Component Value Date   CHOL 170 10/26/2017   HDL 79 10/26/2017   LDLCALC 78 10/26/2017   TRIG 47 10/26/2017   CHOLHDL 2.2 10/26/2017   At foal, controlled

## 2018-05-11 DIAGNOSIS — E785 Hyperlipidemia, unspecified: Secondary | ICD-10-CM | POA: Diagnosis not present

## 2018-05-11 DIAGNOSIS — I10 Essential (primary) hypertension: Secondary | ICD-10-CM | POA: Diagnosis not present

## 2018-05-12 ENCOUNTER — Telehealth: Payer: Self-pay | Admitting: Family Medicine

## 2018-05-12 LAB — LIPID PANEL
CHOLESTEROL TOTAL: 159 mg/dL (ref 100–199)
Chol/HDL Ratio: 2 ratio (ref 0.0–4.4)
HDL: 81 mg/dL (ref >39)
LDL CALC: 69 mg/dL (ref 0–99)
Triglycerides: 45 mg/dL (ref 0–149)
VLDL Cholesterol Cal: 9 mg/dL (ref 5–40)

## 2018-05-12 LAB — CMP14+EGFR
ALBUMIN: 4.1 g/dL (ref 3.5–4.8)
ALK PHOS: 86 IU/L (ref 39–117)
ALT: 14 IU/L (ref 0–32)
AST: 28 IU/L (ref 0–40)
Albumin/Globulin Ratio: 1.5 (ref 1.2–2.2)
BILIRUBIN TOTAL: 0.4 mg/dL (ref 0.0–1.2)
BUN / CREAT RATIO: 16 (ref 12–28)
BUN: 17 mg/dL (ref 8–27)
CO2: 28 mmol/L (ref 20–29)
CREATININE: 1.05 mg/dL — AB (ref 0.57–1.00)
Calcium: 9.9 mg/dL (ref 8.7–10.3)
Chloride: 99 mmol/L (ref 96–106)
GFR calc Af Amer: 62 mL/min/{1.73_m2} (ref >59)
GFR calc non Af Amer: 54 mL/min/{1.73_m2} — ABNORMAL LOW (ref >59)
Globulin, Total: 2.8 g/dL (ref 1.5–4.5)
Glucose: 102 mg/dL — ABNORMAL HIGH (ref 65–99)
Potassium: 3.5 mmol/L (ref 3.5–5.2)
Sodium: 144 mmol/L (ref 134–144)
Total Protein: 6.9 g/dL (ref 6.0–8.5)

## 2018-05-12 NOTE — Telephone Encounter (Signed)
Patient lvm returning your call Cb# [phone]

## 2018-05-12 NOTE — Telephone Encounter (Signed)
Spoke with patient and advised of recent labs.

## 2018-05-22 ENCOUNTER — Other Ambulatory Visit: Payer: Self-pay | Admitting: Family Medicine

## 2018-05-22 ENCOUNTER — Other Ambulatory Visit: Payer: Self-pay | Admitting: Cardiovascular Disease

## 2018-05-24 ENCOUNTER — Other Ambulatory Visit: Payer: Self-pay

## 2018-05-24 MED ORDER — HYDROCHLOROTHIAZIDE 25 MG PO TABS: ORAL_TABLET | ORAL | 1 refills | Status: DC

## 2018-06-05 ENCOUNTER — Encounter (HOSPITAL_COMMUNITY): Payer: Self-pay | Admitting: Family Medicine

## 2018-06-05 ENCOUNTER — Ambulatory Visit (INDEPENDENT_AMBULATORY_CARE_PROVIDER_SITE_OTHER): Payer: PPO

## 2018-06-05 ENCOUNTER — Ambulatory Visit (HOSPITAL_COMMUNITY)
Admission: EM | Admit: 2018-06-05 | Discharge: 2018-06-05 | Disposition: A | Discharge status: Home / Self Care | Payer: PPO | Attending: Family Medicine | Admitting: Family Medicine

## 2018-06-05 DIAGNOSIS — S8991XA Unspecified injury of right lower leg, initial encounter: Secondary | ICD-10-CM | POA: Diagnosis not present

## 2018-06-05 DIAGNOSIS — M25561 Pain in right knee: Secondary | ICD-10-CM

## 2018-06-05 MED ORDER — IBUPROFEN 800 MG PO TABS: 800.0000 mg | ORAL_TABLET | Freq: Three times a day (TID) | ORAL | 0 refills | Status: DC

## 2018-06-05 NOTE — ED Triage Notes (Signed)
Pt state she fell yesterday and hurt her right knee.

## 2018-06-07 ENCOUNTER — Encounter: Payer: Self-pay | Admitting: Family Medicine

## 2018-06-07 ENCOUNTER — Ambulatory Visit (INDEPENDENT_AMBULATORY_CARE_PROVIDER_SITE_OTHER): Payer: PPO | Admitting: Family Medicine

## 2018-06-07 VITALS — BP 130/80 | HR 64 | Resp 12 | Ht 61.5 in | Wt 186.0 lb

## 2018-06-07 DIAGNOSIS — G8929 Other chronic pain: Secondary | ICD-10-CM | POA: Diagnosis not present

## 2018-06-07 DIAGNOSIS — M25561 Pain in right knee: Secondary | ICD-10-CM | POA: Diagnosis not present

## 2018-06-07 DIAGNOSIS — I10 Essential (primary) hypertension: Secondary | ICD-10-CM

## 2018-06-07 DIAGNOSIS — W19XXXD Unspecified fall, subsequent encounter: Secondary | ICD-10-CM

## 2018-06-07 DIAGNOSIS — Y92009 Unspecified place in unspecified non-institutional (private) residence as the place of occurrence of the external cause: Secondary | ICD-10-CM

## 2018-06-07 DIAGNOSIS — E669 Obesity, unspecified: Secondary | ICD-10-CM

## 2018-06-07 NOTE — Assessment & Plan Note (Signed)
Controlled, no change in medication DASH diet and commitment to daily physical activity for a minimum of 30 minutes discussed and encouraged, as a part of hypertension management. The importance of attaining a healthy weight is also discussed.  BP/Weight 06/07/2018 06/05/2018 05/06/2018 05/05/2018 03/04/2018 02/08/2018 2/78/7183  Systolic BP 672 550 016 429 037 955 831  Diastolic BP 80 70 88 82 83 72 72  Wt. (Lbs) 186.04 184 184 - 185 183 184  BMI 34.58 34.2 34.2 - 34.39 33.47 33.65

## 2018-06-07 NOTE — ED Provider Notes (Signed)
[place]   [id number] 06/05/18 Arrival Time: 1448  ASSESSMENT & PLAN:  1. Acute pain of right knee     Imaging: Dg Knee Complete 4 Views Right  Result Date: 06/05/2018 CLINICAL DATA:  [place] yesterday, RIGHT knee injury. EXAM: RIGHT KNEE - COMPLETE 4+ VIEW COMPARISON:  None. FINDINGS: There is mild degenerative narrowing of the medial and lateral compartments, with associated mild osseous spurring. No fracture line or displaced fracture fragment seen. No convincing joint effusion and adjacent soft tissues are unremarkable. IMPRESSION: 1. No acute findings.  No osseous fracture or dislocation. 2. Mild degenerative spurring. Electronically Signed   By: Franki Cabot M.D.   On: 06/05/2018 17:34     Meds ordered this encounter  Medications  . ibuprofen (ADVIL,MOTRIN) 800 MG tablet    Sig: Take 1 tablet (800 mg total) by mouth 3 (three) times daily.    Dispense:  21 tablet    Refill:  0    Follow-up Information    [name], MD.   Specialty:  Family Medicine Why:  As needed or if you are not improving over the next week. Contact information: [place] [address] [phone]          No fracture seen. I personally have reviewed the imaging performed today. WBAT.  Reviewed expectations re: course of current medical issues. Questions answered. Outlined signs and symptoms indicating need for more acute intervention. Patient verbalized understanding. After Visit Summary given.  SUBJECTIVE: History from: patient. Desiree [name] is a 72 y.o. female who reports fairly persistent mild to moderate pain of her right knee; described as a "soreness" without radiation. Poorly localized. More back and lateral knee. No locking or giving out of L knee. Injury/trama: yes, reports falling forward onto knees yesterday; ambulatory since. Symptoms have stabilized since beginning. Relieved by: rest. Worsened by: weight bearing and  ambulation. Associated symptoms: none reported. Extremity sensation changes or weakness: none. Self treatment: has not tried OTCs for relief of pain. History of similar: no.  Past Surgical History:  Procedure Laterality Date  . BILATERAL CARPAL TUNNEL RELEASE  1980'S  . CARDIAC CATHETERIZATION  02-01-2009  DR EICHHORN   NORMAL CORONARY ARTERIES/ NORMAL LV SIZE AND FUNCTION/ AORTIC ROOT SIZE AT THE UPPER LIMIT OF NORMAL   . CARPAL TUNNEL RELEASE    . CERVICAL DISC SURGERY  12-12-2005  DR Vertell Limber   LEFT  C6 - C7 HERINATED / DDD/ SPONDYLOSIS  . COLONOSCOPY N/A 02/10/2014   Procedure: COLONOSCOPY;  Surgeon: Danie Binder, MD;  [facility];  Service: Endoscopy;  Laterality: N/A;  8:30  . CYSTOCELE REPAIR  08/02/2012   Procedure: ANTERIOR REPAIR (CYSTOCELE);  Surgeon: Ailene Rud, MD;  Location: [facility];  Service: Urology;  Laterality: N/A;  [employer] Uphold Anterior Pelvic Floor Sacrospinus Repair. Anterior wall of the vagina.  . ESOPHAGOGASTRODUODENOSCOPY N/A 02/10/2014   Procedure: ESOPHAGOGASTRODUODENOSCOPY (EGD);  Surgeon: Danie Binder, MD;  [facility];  Service: Endoscopy;  Laterality: N/A;  . GIVENS CAPSULE STUDY N/A 03/13/2014   Procedure: GIVENS CAPSULE STUDY;  Surgeon: Danie Binder, MD;  [facility];  Service: Endoscopy;  Laterality: N/A;  7:30  . NECK SURGERY  2007  . RIGHT PARTIAL MASTECTOMY  11-11-2004  DR [name] YOUNG   DUCTAL CARCINOMA IN SITU RIGHT BREAST  . TOTAL ABDOMINAL HYSTERECTOMY W/ BILATERAL SALPINGOOPHORECTOMY  1990  . TRANSTHORACIC ECHOCARDIOGRAM  03-30-2008  DR Joycelyn Schmid  SIM[facility]   LV SIZE AND FUNCTION NORMAL/ MODERATE AORTIC ARCH DILATATION/ MILD MR     ROS: As per HPI.   OBJECTIVE:  Vitals:   06/05/18 1608 06/05/18 1609  BP: 130/70   Pulse: 78   Resp: 18   Temp: 97.8 F (36.6 C)   TempSrc: Oral   SpO2: 100%   Weight:  83.5 kg    General appearance: alert; no distress Extremities:  warm and well perfused; without gross deformities; poorly localized mild tenderness over her right knee, more laterally, with mild swelling and no bruising; affected joint ROM: normal but reports posterior discomfort with knee extension CV: brisk extremity capillary refill Skin: warm and dry Neurologic: normal gait; normal symmetric reflexes in all extremities; normal sensation in all extremities Psychological: alert and cooperative; normal mood and affect  Allergies  Allergen Reactions  . Codeine Other (See Comments)    CONFUSION/ DIZZY    Past Medical History:  Diagnosis Date  . Allergic rhinitis   . Arthritis HIPS/ WRISTS  . DDD (degenerative disc disease), cervical   . GERD (gastroesophageal reflux disease) OCCASIONAL  . Heart murmur ASYMPTOMATIC  . History of Bell's palsy 2011  RIGHT SIDE -- RESOLVED  . 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 History of CVA (cerebrovascular accident) PER SCAN IN 2011  . Hyperlipidemia   . Hypertension   . Impaired fasting glucose PER PCP  DR SIMPSON   WATCH DIET  . Mixed urge and stress incontinence   . Nocturia   . Sinus drainage   . Thoracic ascending aortic aneurysm ([place]) LAST CHEST CT 09-02-2010--  FOLLOWED BY  CARDIOLOGIST--  DR [id number]  . Vaginal wall prolapse    Social History   Socioeconomic History  . Marital status: Divorced    Spouse name: Not on file  . Number of children: 4  . Years of education: Not on file  . Highest education level: Not on file  Occupational History  . Not on file  Social Needs  . Financial resource strain: Not on file  . Food insecurity:    Worry: Not on file    Inability: Not on file  . Transportation needs:    Medical: Not on file    Non-medical: Not on file  Tobacco Use  . Smoking status: Never Smoker  . Smokeless tobacco: Never Used  Substance and Sexual Activity  . Alcohol use: No  . Drug use: No  . Sexual  activity: Not Currently  Lifestyle  . Physical activity:    Days per week: Not on file    Minutes per session: Not on file  . Stress: Not on file  Relationships  . Social connections:    Talks on phone: Not on file    Gets together: Not on file    Attends religious service: Not on file    Active member of club or organization: Not on file    Attends meetings of clubs or organizations: Not on file    Relationship status: Not on file  Other Topics Concern  . Not on file  Social History Narrative   RETIRED FROM [employer]. 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   Family History  Problem Relation Age of Onset  . Kidney failure Mother   . Hypertension Mother   . Heart attack Mother 18  . Throat cancer Father   . Prostate cancer Father   . Lung cancer Father 59  .  Breast cancer Sister   . Hypertension Brother        2 stents  . Colon cancer Neg Hx   . Colon polyps Neg Hx    Past Surgical History:  Procedure Laterality Date  . BILATERAL CARPAL TUNNEL RELEASE  1980'S  . CARDIAC CATHETERIZATION  02-01-2009  DR EICHHORN   NORMAL CORONARY ARTERIES/ NORMAL LV SIZE AND FUNCTION/ AORTIC ROOT SIZE AT THE UPPER LIMIT OF NORMAL   . CARPAL TUNNEL RELEASE    . CERVICAL DISC SURGERY  12-12-2005  DR Vertell Limber   LEFT  C6 - C7 HERINATED / DDD/ SPONDYLOSIS  . COLONOSCOPY N/A 02/10/2014   Procedure: COLONOSCOPY;  Surgeon: Danie Binder, MD;  [facility];  Service: Endoscopy;  Laterality: N/A;  8:30  . CYSTOCELE REPAIR  08/02/2012   Procedure: ANTERIOR REPAIR (CYSTOCELE);  Surgeon: Ailene Rud, MD;  Location[facility];  Service: Urology;  Laterality: N/A;  [employer] Uphold Anterior Pelvic Floor Sacrospinus Repair. Anterior wall of the vagina.  . ESOPHAGOGASTRODUODENOSCOPY N/A 02/10/2014   Procedure: ESOPHAGOGASTRODUODENOSCOPY (EGD);  Surgeon: Danie Binder, MD;  [facility];  Service: Endoscopy;  Laterality: N/A;  . GIVENS CAPSULE STUDY N/A 03/13/2014    Procedure: GIVENS CAPSULE STUDY;  Surgeon: Danie Binder, MD;  [facility];  Service: Endoscopy;  Laterality: N/A;  7:30  . NECK SURGERY  2007  . RIGHT PARTIAL MASTECTOMY  11-11-2004  DR [name] YOUNG   DUCTAL CARCINOMA IN SITU RIGHT BREAST  . TOTAL ABDOMINAL HYSTERECTOMY W/ BILATERAL SALPINGOOPHORECTOMY  1990  . TRANSTHORACIC ECHOCARDIOGRAM  03-30-2008  DR MARGARET SIMPSON   LV SIZE AND FUNCTION NORMAL/ MODERATE AORTIC ARCH DILATATION/ MILD MR      Vanessa Kick, MD 06/07/18 1418

## 2018-06-07 NOTE — Patient Instructions (Signed)
F/u as before , call if you need me sooner  You are referred to Dr [name] for sooner appointment because of recent fall   You are referred to physical therapy, to help reduce your fall risk  Work excuse fro today to return the following week   Thank you  for choosing [facility]. We consider it a privelige to serve you.  Delivering excellent health care in a caring and  compassionate way is our goal.  Partnering with you,  so that together we can achieve this goal is our strategy.

## 2018-06-07 NOTE — Progress Notes (Signed)
IVADELL GAUL     MRN: [id number]      DOB: [birth year]   HPI Ms. Desiree [name] is here for follow up and re-evaluation following a fall 3 days ago, states she " made a quick and wong turn" and subsequently fell landing on left  knee , but the following day, because of progressive pain and swelling of the right knee she went to Urgent care, no fracture seen, symptoms are improving , however , her ability to weight bear is limited and she has localized point tenderness on medial aspect. She has scheduled f/u of  Fright knee pain following recent intra articular injection, daughter and patient are concerned that though she has no bony injury , there may soft tissue injury which is debilitating Her daughter also specifically wants to know if I believe that her Mother should retire as she states she reports that I told her that at her last visit. She concludes that following a family meeting with her siblings, they will get together to help to pay off the debt that her Mother is working to pay off on her vehicle She specifically denies vertigo or light headedness preceding and as potential cause of her fall ROS Denies recent fever or chills. Denies sinus pressure, nasal congestion, ear pain or sore throat. Denies chest congestion, productive cough or wheezing. Denies chest pains, palpitations and leg swelling Denies abdominal pain, nausea, vomiting,diarrhea or constipation.   Denies dysuria, frequency, hesitancy or incontinence.  Denies headaches, seizures, numbness, or tingling. Denies depression, anxiety or insomnia. Denies skin break down or rash.   PE  BP 130/80 (BP Location: Right Arm, Patient Position: Sitting, Cuff Size: Large)   Pulse 64   Resp 12   Ht 5' 1.5" (1.562 m)   Wt 186 lb 0.6 oz (84.4 kg)   SpO2 91% Comment: room air  BMI 34.58 kg/m   Patient alert and oriented and in no cardiopulmonary distress.  HEENT: No facial asymmetry, EOMI,   oropharynx pink and moist.  Neck supple  no JVD, no mass.  Chest: Clear to auscultation bilaterally.  CVS: S1, S2 no murmurs, no S3.Regular rate.  ABD: Soft non tender.   Ext: No edema  MS: Adequate ROM spine, shoulders, hips and reduced in right  Knee which is swollen and deformed with tenderness on medial aspect.  Skin: Intact, no ulcerations or rash noted.  Psych: Good eye contact, normal affect. Memory intact not anxious or depressed appearing.  CNS: CN 2-12 intact, power,  normal throughout.no focal deficits noted.   Assessment & Plan  Knee pain, right 3 day h/o increased right knee pain an swelling following a fall the day before, due to loss of balance.Has f/u with ortho, next month, however because of recent injury and fall will refer for sooner appt, also to physical therapy fpoor evaluation and management following fall to reduce likely recurrence  Fall at home, subsequent encounter Accidental fall at home on 06/04/2018 , resulting in acute pain and swelling , good response to  Ibuprofen , however has significant arthritis in knee, PT to eval and treat for gait instability causing fall with subsequent soft tissue injury to knee ork excuse from 10/14 to return 06/14/2018  Essential hypertension Controlled, no change in medication DASH diet and commitment to daily physical activity for a minimum of 30 minutes discussed and encouraged, as a part of hypertension management. The importance of attaining a healthy weight is also discussed.  BP/Weight 06/07/2018 06/05/2018 05/06/2018 05/05/2018 03/04/2018 02/08/2018  0/97/3532  Systolic BP 992 426 834 196 222 979 892  Diastolic BP 80 70 88 82 83 72 72  Wt. (Lbs) 186.04 184 184 - 185 183 184  BMI 34.58 34.2 34.2 - 34.39 33.47 33.65       Obesity (BMI 30-39.9) Deteriorated.   Weight /BMI 06/07/2018 06/05/2018 05/06/2018  WEIGHT 186 lb 0.6 oz 184 lb 184 lb  HEIGHT 5' 1.5" - 5' 1.5"  BMI 34.58 kg/m2 34.2 kg/m2 34.2 kg/m2

## 2018-06-07 NOTE — Assessment & Plan Note (Addendum)
3 day h/o increased right knee pain an swelling following a fall the day before, due to loss of balance.Has f/u with ortho, next month, however because of recent injury and fall will refer for sooner appt, also to physical therapy fpoor evaluation and management following fall to reduce likely recurrence

## 2018-06-07 NOTE — Assessment & Plan Note (Signed)
Accidental fall at home on 06/04/2018 , resulting in acute pain and swelling , good response to  Ibuprofen , however has significant arthritis in knee, PT to eval and treat for gait instability causing fall with subsequent soft tissue injury to knee ork excuse from 10/14 to return 06/14/2018

## 2018-06-07 NOTE — Assessment & Plan Note (Signed)
Deteriorated.   Weight /BMI 06/07/2018 06/05/2018 05/06/2018  WEIGHT 186 lb 0.6 oz 184 lb 184 lb  HEIGHT 5' 1.5" - 5' 1.5"  BMI 34.58 kg/m2 34.2 kg/m2 34.2 kg/m2

## 2018-06-09 DIAGNOSIS — I10 Essential (primary) hypertension: Secondary | ICD-10-CM | POA: Diagnosis not present

## 2018-06-09 DIAGNOSIS — N183 Chronic kidney disease, stage 3 (moderate): Secondary | ICD-10-CM | POA: Diagnosis not present

## 2018-06-09 DIAGNOSIS — D509 Iron deficiency anemia, unspecified: Secondary | ICD-10-CM | POA: Diagnosis not present

## 2018-06-09 DIAGNOSIS — R809 Proteinuria, unspecified: Secondary | ICD-10-CM | POA: Diagnosis not present

## 2018-06-10 ENCOUNTER — Ambulatory Visit (HOSPITAL_COMMUNITY): Payer: PPO | Attending: Family Medicine

## 2018-06-10 ENCOUNTER — Telehealth (HOSPITAL_COMMUNITY): Payer: Self-pay

## 2018-06-10 ENCOUNTER — Other Ambulatory Visit: Payer: Self-pay

## 2018-06-10 ENCOUNTER — Encounter (HOSPITAL_COMMUNITY): Payer: Self-pay

## 2018-06-10 ENCOUNTER — Ambulatory Visit (INDEPENDENT_AMBULATORY_CARE_PROVIDER_SITE_OTHER): Payer: PPO | Admitting: Orthopaedic Surgery

## 2018-06-10 ENCOUNTER — Encounter (INDEPENDENT_AMBULATORY_CARE_PROVIDER_SITE_OTHER): Payer: Self-pay | Admitting: Orthopaedic Surgery

## 2018-06-10 VITALS — BP 145/81 | HR 67 | Ht 61.5 in | Wt 184.0 lb

## 2018-06-10 DIAGNOSIS — S8001XA Contusion of right knee, initial encounter: Secondary | ICD-10-CM | POA: Diagnosis not present

## 2018-06-10 DIAGNOSIS — M25561 Pain in right knee: Secondary | ICD-10-CM | POA: Diagnosis not present

## 2018-06-10 DIAGNOSIS — R2689 Other abnormalities of gait and mobility: Secondary | ICD-10-CM | POA: Diagnosis not present

## 2018-06-10 DIAGNOSIS — M25661 Stiffness of right knee, not elsewhere classified: Secondary | ICD-10-CM | POA: Diagnosis not present

## 2018-06-10 DIAGNOSIS — M6281 Muscle weakness (generalized): Secondary | ICD-10-CM | POA: Diagnosis not present

## 2018-06-10 DIAGNOSIS — M1711 Unilateral primary osteoarthritis, right knee: Secondary | ICD-10-CM | POA: Diagnosis not present

## 2018-06-10 DIAGNOSIS — G8929 Other chronic pain: Secondary | ICD-10-CM | POA: Diagnosis not present

## 2018-06-10 NOTE — Patient Instructions (Signed)
Supine Bridge reps: 10 sets: 2 hold: 3 seconds daily: 1  weekly: 7      Exercise image step 1   Exercise image step 2 Setup  Begin lying on your back with your arms resting at your sides, your legs bent at the knees and your feet flat on the ground. Movement  Tighten your abdominals and slowly lift your hips off the floor into a bridge position, keeping your back straight. Tip  Make sure to keep your trunk stiff throughout the exercise and your arms flat on the floor. Supine Active Straight Leg Raise reps: 10 sets: 2 hold: 3 seconds daily: 1  weekly: 7      Exercise image step 1   Exercise image step 2 Setup  Begin lying on your back with one knee bent and your other leg straight. Movement  Engaging your thigh muscles, slowly lift your straight leg until it is parallel with your other thigh, then lower it back to the starting position and repeat. Tip  Make sure to keep your leg straight and do not let your back arch during the exercise.

## 2018-06-10 NOTE — Telephone Encounter (Signed)
Pt called at lunch states she would be here today. NF

## 2018-06-10 NOTE — Therapy (Signed)
[place], [place], [address] Phone: [phone]   Fax:  [phone]  Physical Therapy Evaluation  Patient Details  Name: Desiree [name] MRN: [id number] Date of Birth: 1946-07-27 Referring Provider (PT): [name], MD   Encounter Date: 06/10/2018  PT End of Session - 06/10/18 1556    Visit Number  1    Number of Visits  13    Date for PT Re-Evaluation  07/22/18    Authorization Type  [employer] (no visit limit, no auth required)    Authorization Time Period  06/10/18 - 07/23/18    Authorization - Visit Number  1    Authorization - Number of Visits  13    PT Start Time  1350    PT Stop Time  1430    PT Time Calculation (min)  40 min    Activity Tolerance  Patient tolerated treatment well    Behavior During Therapy  [facility] for tasks assessed/performed       Past Medical History:  Diagnosis Date  . Allergic rhinitis   . Arthritis HIPS/ WRISTS  . DDD (degenerative disc disease), cervical   . GERD (gastroesophageal reflux disease) OCCASIONAL  . Heart murmur ASYMPTOMATIC  . History of Bell's palsy 2011  RIGHT SIDE -- RESOLVED  . 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 History of CVA (cerebrovascular accident) PER SCAN IN 2011  . Hyperlipidemia   . Hypertension   . Impaired fasting glucose PER PCP  DR SIMPSON   WATCH DIET  . Mixed urge and stress incontinence   . Nocturia   . Sinus drainage   . Thoracic ascending aortic aneurysm ([place]) LAST CHEST CT 09-02-2010--  FOLLOWED BY  CARDIOLOGIST--  DR RFFMBWGY  . Vaginal wall prolapse     Past Surgical History:  Procedure Laterality Date  . BILATERAL CARPAL TUNNEL RELEASE  1980'S  . CARDIAC CATHETERIZATION  02-01-2009  DR EICHHORN   NORMAL CORONARY ARTERIES/ NORMAL LV SIZE AND FUNCTION/ AORTIC ROOT SIZE AT THE UPPER LIMIT OF NORMAL   . CARPAL TUNNEL RELEASE    . CERVICAL  DISC SURGERY  12-12-2005  DR Vertell Limber   LEFT  C6 - C7 HERINATED / DDD/ SPONDYLOSIS  . COLONOSCOPY N/A 02/10/2014   Procedure: COLONOSCOPY;  Surgeon: Danie Binder, MD;  [facility];  Service: Endoscopy;  Laterality: N/A;  8:30  . CYSTOCELE REPAIR  08/02/2012   Procedure: ANTERIOR REPAIR (CYSTOCELE);  Surgeon: Ailene Rud, MD;  Location[facility];  Service: Urology;  Laterality: N/A;  [employer] Uphold Anterior Pelvic Floor Sacrospinus Repair. Anterior wall of the vagina.  . ESOPHAGOGASTRODUODENOSCOPY N/A 02/10/2014   Procedure: ESOPHAGOGASTRODUODENOSCOPY (EGD);  Surgeon: Danie Binder, MD;  [facility];  Service: Endoscopy;  Laterality: N/A;  . GIVENS CAPSULE STUDY N/A 03/13/2014   Procedure: GIVENS CAPSULE STUDY;  Surgeon: Danie Binder, MD;  [facility];  Service: Endoscopy;  Laterality: N/A;  7:30  . NECK SURGERY  2007  . RIGHT PARTIAL MASTECTOMY  11-11-2004  DR [name] YOUNG   DUCTAL CARCINOMA IN SITU RIGHT BREAST  . TOTAL ABDOMINAL HYSTERECTOMY W/ BILATERAL SALPINGOOPHORECTOMY  1990  . TRANSTHORACIC ECHOCARDIOGRAM  03-30-2008  DR MARGARET SIMPSON   LV SIZE AND FUNCTION NORMAL/ MODERATE AORTIC ARCH DILATATION/ MILD MR    There were no vitals filed for this visit.  Subjective Assessment - 06/10/18 1358    Subjective  Patient reports a chronic history of knee pain. She states last Friday she fell when she missed a step going into her house. She reports she landed on her Lt then Rt knee and had no pain initially. Saturday she had swelling and 10/10 pain in Rt knee so she went to urgent care. They took x-rays which were negative for fracture or acute injuries. She states that her pain has gone down and she believes the swelling has gone down as well.    Pertinent History  fall on 06/04/18    Limitations  Standing;Sitting;Walking;House hold activities    How long can you sit comfortably?  prolonged periods is uncomfortable     How long can you stand comfortably?  prolonged standign incresases pain    How long can you walk comfortably?  no difficulty    Patient Stated Goals  to improve walking and reduce pain    Currently in Pain?  Yes    Pain Score  2    toothache   Pain Location  Knee    Pain Orientation  Right;Anterior    Pain Descriptors / Indicators  Aching    Pain Type  Acute pain    Pain Onset  In the past 7 days    Pain Frequency  Intermittent    Aggravating Factors   standing or sitting prolonged     Pain Relieving Factors  ibuprofen 800mg , ice         [facility] PT Assessment - 06/10/18 0001      Assessment   Medical Diagnosis  Right Knee Pain    Referring Provider (PT)  [name], MD    Onset Date/Surgical Date  06/04/18   several years, but recent fall causing Rt knee swelling     Precautions   Precautions  None      Restrictions   Weight Bearing Restrictions  No      Balance Screen   Has the patient fallen in the past 6 months  Yes    How many times?  1    Has the patient had a decrease in activity level because of a fear of falling?   Yes    Is the patient reluctant to leave their home because of a fear of falling?   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    Leisure  walking on trail in [place] for tasks assessed      Observation/Other Assessments   Focus on Therapeutic Outcomes (FOTO)    take next session, pt not in system      Functional Tests   Functional tests  Single leg stance      Single Leg Stance   Comments  Rt LE = 5 sec, Lt LE = 4 sec      ROM / Strength   AROM / PROM / Strength  AROM;Strength;PROM      AROM   AROM Assessment Site  Knee    Right/Left Knee  Right;Left    Right Knee Extension  0    Right Knee Flexion  95    Left Knee Extension  0    Left Knee Flexion  110      PROM   PROM Assessment Site  Knee    Right/Left Knee  Right;Left    Right Knee Extension  0    Right Knee Flexion  106    Left Knee Extension  0    Left Knee Flexion  116      Strength   Strength Assessment Site  Hip;Knee;Ankle    Right Hip Flexion  3+/5    Right Hip Extension  3+/5    Right Hip ABduction  4-/5    Left Hip Flexion  3+/5    Left Hip Extension  3+/5    Left Hip ABduction  4-/5    Right/Left Knee  Right;Left    Right Knee Flexion  3+/5    Right Knee Extension  4+/5    Left Knee Flexion  4-/5    Left Knee Extension  4+/5    Right Ankle Dorsiflexion  4/5    Left Ankle Dorsiflexion  4/5      Ambulation/Gait   Ambulation/Gait  Yes    Ambulation/Gait Assistance  7: Independent    Ambulation Distance (Feet)  350 Feet   2MWT   Assistive device  None    Gait Pattern  Step-through pattern;Decreased stance time - right;Decreased stride length;Trendelenburg;Trunk flexed    Ambulation Surface  Level;Indoor    Gait velocity  0.88 m/s    Stairs  Yes    Stair Management Technique  One rail Right;Two rails;Alternating pattern;Forwards    Number of Stairs  4    Height of Stairs  6    Gait Comments  ascend: 1 rail, descend: 2 rails       Objective measurements completed on examination: See above findings.    [facility] Adult PT Treatment/Exercise - 06/10/18 0001      Exercises   Exercises  Knee/Hip      Knee/Hip Exercises: Supine   Bridges  Both;1 set;15 reps    Bridges Limitations  3 sec holds    Straight Leg Raises  Both;1 set;10 reps    Straight Leg  Raises Limitations  quad set before        PT Education - 06/10/18 1556    Education Details  Educated on exam findings and appropriate plan of care. Educated on option to participate in aquatic therapy if she would like to do so. Educated on initial HEP.    Person(s) Educated  Patient    Methods  Explanation;Handout    Comprehension  Verbalized understanding;Returned demonstration       PT Short Term Goals - 06/10/18 1602      PT SHORT TERM GOAL #1   Title  Patient will be independe with HEP, updated PRN, to improve funcitonal ROM and strength for bil LE's to improve activity tolerance.     Time  2    Period  Weeks    Status  New    Target Date  06/24/18  PT SHORT TERM GOAL #2   Title  Patient will improve MMT by 1/2 grade for limited groups to demonstrate significnat improvement in LE function and improve endurance with walking and and daily activities.     Time  3    Period  Weeks    Status  New    Target Date  07/01/18      PT SHORT TERM GOAL #3   Title  Patient will perform SLS for 15 seconds on bil LE to indicate improved balance and reduced fall risk with gait and stair mobility.    Time  3    Period  Weeks    Status  New        PT Long Term Goals - 06/10/18 1607      PT LONG TERM GOAL #1   Title  Patient will improve MMT by 1 grade for limited groups to demonstrate significnat improvement in LE function and improve endurance with walking and and daily activities.     Time  6    Period  Weeks    Status  New    Target Date  07/22/18      PT LONG TERM GOAL #2   Title  Patient will ambulate at = or > 1.0 m/s with LRAD during 2MWT to demonstrate safe community walking speed, reduced fall risk, and improved community access.    Time  6    Period  Weeks    Status  New      PT LONG TERM GOAL #3   Title  Patient will improve AROM for bil knees to 0-120 to obtain functional ROM without pain for improved quality and performance of gait and stairs ambulation.     Time  6    Period  Weeks    Status  New      PT LONG TERM GOAL #4   Title  Patient will reports no pain for entire week while participating in HEP and walking program of 30 minutes or more, 3x/week to improve participation in regular exercise with no increased pain.     Time  6    Period  Weeks    Status  New        Plan - 06/10/18 1558    Clinical Impression Statement  Ms. Stickels presents for physical therapy evaluation for Rt knee pain and reports s history of bil knee pain, Rt>LT. She had a fall recently, on 06/04/18, and images were taken ruling fractures however patient has had swelling. She presents with limited ROM bil knees, Rt>Lt, decreased strength, impaired gait, impaired balance, and pain with prolonged activities. She was educated today on benefits of physical therapy in land based and aquatic setting to address current impairments and was provided initial HEP. She will benefit form skilled PT interventions to address impairments and progress mobility/activity tolerance to improve QOL and overall wellness.    History and Personal Factors relevant to plan of care:  Rt knee injeciton, July 2019    Clinical Presentation  Stable    Clinical Presentation due to:  MMT, ROM, 2MWT, SLS, clinical judgement    Clinical Decision Making  Low    Rehab Potential  Good    PT Frequency  2x / week    PT Duration  6 weeks    PT Treatment/Interventions  ADLs/Self Care Home Management;Cryotherapy;Electrical Stimulation;Moist Heat;Gait training;Stair training;Functional mobility training;Therapeutic activities;Therapeutic exercise;Balance training;Neuromuscular re-education;Patient/family education;Manual techniques;Passive range of motion;Taping    PT Next Visit Plan  Review  eval and goals. Review HEP exercises. Initiate joint mobilization for pain, grade I/II to bil knee joints. Perform exercises for hip and LE strengthing in supine and proress to standing.    PT Home Exercise Plan  Eval: bridge,  SLR    Consulted and Agree with Plan of Care  Patient       Patient will benefit from skilled therapeutic intervention in order to improve the following deficits and impairments:  Abnormal gait, Pain, Decreased mobility, Postural dysfunction, Decreased activity tolerance, Decreased endurance, Decreased range of motion, Decreased strength, Hypomobility, Impaired flexibility, Increased edema, Difficulty walking, Decreased balance  Visit Diagnosis: Chronic pain of right knee  Stiffness of right knee, not elsewhere classified  Other abnormalities of gait and mobility  Muscle weakness (generalized)     Problem List Patient Active Problem List   Diagnosis Date Noted  . Fall at home, subsequent encounter 06/07/2018  . Knee pain, right 05/31/2017  . CKD (chronic kidney disease) stage 3, GFR 30-59 ml/min (HCC) 10/01/2016  . 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 Urinary incontinence 05/22/2014  . Metabolic syndrome X 03/50/0938  . Osteopenia 12/12/2013  . Anemia 11/29/2013  . Arthritis of knee, right 02/21/2013  . Obesity (BMI 30-39.9) 10/17/2012  . Thoracic aortic aneurysm ([place]) 10/12/2012  . Allergic rhinitis 03/02/2012  . BLADDER PROLAPSE 09/24/2010  . NEOPLASM, MALIGNANT, BREAST, RIGHT 02/04/2008  . Hyperlipidemia LDL goal <100 02/04/2008  . Essential hypertension 02/04/2008    Kipp Brood, PT, DPT Physical Therapist with [facility]  06/10/2018 4:22 PM    [place], [place], [address] Phone: [phone]   Fax:  [phone]  Name: Desiree [name] MRN: [id number] Date of Birth: 04-Jul-1946

## 2018-06-10 NOTE — Progress Notes (Signed)
Office Visit Note   Patient: Desiree [name]           Date of Birth: 11-11-1945           MRN: [id number] Visit Date: 06/10/2018              Requested by: [name], [address], [place], [place] [address] PCP: [name], MD   Assessment & Plan: Visit Diagnoses:  1. Arthritis of knee, right   2. Contusion of right knee, initial encounter     Plan: She can ice continue her ibuprofen work on straight leg raising.  I plan to check her back in a month as planned we will consider possibly repeat cortisone injection at that time.  She is having increasing pain or starts having problems walking she will call us.  Follow-Up Instructions: Return in about 1 month (around 07/11/2018).   Orders:  No orders of the defined types were placed in this encounter.  No orders of the defined types were placed in this encounter.     Procedures: No procedures performed   Clinical Data: No additional findings.   Subjective: Chief Complaint  Patient presents with  . Right Knee - Pain    HPI 72 year old female returns for problems with her right knee after a fall 1 week ago on 06/04/2018.  She did not have any abrasions or lacerations but has had increased swelling and popping since that time.  X-rays were taken after the fall on 06/05/2018 which showed the moderate to severe degenerative arthritic changes with spurring but no fracture.  She is taken some ibuprofen use some ice she is here with her daughter and states her knees been doing a little bit better.  She had previous injection back in July done here in our clinic.  She is walking better today is her hands to get from sitting to standing amatory with a slight knee limp. She did not Fall due to  knee locking.  Review of Systems unchanged from last office visit other than as mentioned in HPI.   Objective: Vital Signs: BP (!) 145/81   Pulse 67   Ht 5' 1.5" (1.562 m)   Wt 184 lb (83.5 kg)   BMI  34.20 kg/m   Physical Exam  Constitutional: She is oriented to person, place, and time. She appears well-developed.  HENT:  Head: Normocephalic.  Right Ear: External ear normal.  Left Ear: External ear normal.  Eyes: Pupils are equal, round, and reactive to light.  Neck: No tracheal deviation present. No thyromegaly present.  Cardiovascular: Normal rate.  Pulmonary/Chest: Effort normal.  Abdominal: Soft.  Neurological: She is alert and oriented to person, place, and time.  Skin: Skin is warm and dry.  Psychiatric: She has a normal mood and affect. Her behavior is normal.    Ortho Exam over the right knee there is mild knee effusion crepitus knee extension on the right negative on the left normal patellar tracking.  Marginal osteophytes with medial lateral joint line tenderness.  Collateral crucial exam is normal.  Specialty Comments:  No specialty comments available.  Imaging: No results found.   PMFS History: Patient Active Problem List   Diagnosis Date Noted  . Fall at home, subsequent encounter 06/07/2018  . Knee pain, right 05/31/2017  . CKD (chronic kidney disease) stage 3, GFR 30-59 ml/min (HCC) 10/01/2016  . 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 Urinary incontinence 05/22/2014  . Metabolic syndrome  X 01/30/2014  . Osteopenia 12/12/2013  . Anemia 11/29/2013  . Arthritis of knee, right 02/21/2013  . Obesity (BMI 30-39.9) 10/17/2012  . Thoracic aortic aneurysm ([place]) 10/12/2012  . Allergic rhinitis 03/02/2012  . BLADDER PROLAPSE 09/24/2010  . NEOPLASM, MALIGNANT, BREAST, RIGHT 02/04/2008  . Hyperlipidemia LDL goal <100 02/04/2008  . Essential hypertension 02/04/2008   Past Medical History:  Diagnosis Date  . Allergic rhinitis   . Arthritis HIPS/ WRISTS  . DDD (degenerative disc disease), cervical   . GERD (gastroesophageal reflux disease) OCCASIONAL  . Heart murmur ASYMPTOMATIC  . History of Bell's palsy 2011  RIGHT SIDE -- RESOLVED  .  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 History of CVA (cerebrovascular accident) PER SCAN IN 2011  . Hyperlipidemia   . Hypertension   . Impaired fasting glucose PER PCP  DR SIMPSON   WATCH DIET  . Mixed urge and stress incontinence   . Nocturia   . Sinus drainage   . Thoracic ascending aortic aneurysm ([place]) LAST CHEST CT 09-02-2010--  FOLLOWED BY  CARDIOLOGIST--  DR YDXAJOIN  . Vaginal wall prolapse     Family History  Problem Relation Age of Onset  . Kidney failure Mother   . Hypertension Mother   . Heart attack Mother 27  . Throat cancer Father   . Prostate cancer Father   . Lung cancer Father 31  . Breast cancer Sister   . Hypertension Brother        2 stents  . Colon cancer Neg Hx   . Colon polyps Neg Hx     Past Surgical History:  Procedure Laterality Date  . BILATERAL CARPAL TUNNEL RELEASE  1980'S  . CARDIAC CATHETERIZATION  02-01-2009  DR EICHHORN   NORMAL CORONARY ARTERIES/ NORMAL LV SIZE AND FUNCTION/ AORTIC ROOT SIZE AT THE UPPER LIMIT OF NORMAL   . CARPAL TUNNEL RELEASE    . CERVICAL DISC SURGERY  12-12-2005  DR Vertell Limber   LEFT  C6 - C7 HERINATED / DDD/ SPONDYLOSIS  . COLONOSCOPY N/A 02/10/2014   Procedure: COLONOSCOPY;  Surgeon: Danie Binder, MD;  [facility];  Service: Endoscopy;  Laterality: N/A;  8:30  . CYSTOCELE REPAIR  08/02/2012   Procedure: ANTERIOR REPAIR (CYSTOCELE);  Surgeon: Ailene Rud, MD;  Location[facility];  Service: Urology;  Laterality: N/A;  [employer] Uphold Anterior Pelvic Floor Sacrospinus Repair. Anterior wall of the vagina.  . ESOPHAGOGASTRODUODENOSCOPY N/A 02/10/2014   Procedure: ESOPHAGOGASTRODUODENOSCOPY (EGD);  Surgeon: Danie Binder, MD;  [facility];  Service: Endoscopy;  Laterality: N/A;  . GIVENS CAPSULE STUDY N/A 03/13/2014   Procedure: GIVENS CAPSULE STUDY;  Surgeon: Danie Binder, MD;  [facility];  Service: Endoscopy;  Laterality: N/A;  7:30  . NECK SURGERY  2007  . RIGHT PARTIAL MASTECTOMY  11-11-2004  DR [name] YOUNG   DUCTAL CARCINOMA IN SITU RIGHT BREAST  . TOTAL ABDOMINAL HYSTERECTOMY W/ BILATERAL SALPINGOOPHORECTOMY  1990  . TRANSTHORACIC ECHOCARDIOGRAM  03-30-2008  DR MARGARET SIMPSON   LV SIZE AND FUNCTION NORMAL/ MODERATE AORTIC ARCH DILATATION/ MILD MR   Social History   Occupational History  . Not on file  Tobacco Use  . Smoking status: Never Smoker  . Smokeless tobacco: Never Used  Substance and Sexual Activity  . Alcohol use: No  . Drug use: No  . Sexual activity: Not Currently

## 2018-06-14 ENCOUNTER — Ambulatory Visit (INDEPENDENT_AMBULATORY_CARE_PROVIDER_SITE_OTHER): Payer: PPO

## 2018-06-14 VITALS — BP 125/83 | HR 57 | Resp 12 | Ht 61.0 in | Wt 180.0 lb

## 2018-06-14 DIAGNOSIS — Z0000 Encounter for general adult medical examination without abnormal findings: Secondary | ICD-10-CM | POA: Diagnosis not present

## 2018-06-14 NOTE — Progress Notes (Signed)
Subjective:   Desiree [name] is a 72 y.o. female who presents for Medicare Annual (Subsequent) preventive examination.  Review of Systems:  Cardiac Risk Factors include: dyslipidemia;obesity (BMI >30kg/m2);microalbuminuria;hypertension     Objective:     Vitals: BP 125/83   Pulse (!) 57   Resp 12   Ht 5\' 1"  (1.549 m)   Wt 180 lb (81.6 kg)   SpO2 99%   BMI 34.01 kg/m   Body mass index is 34.01 kg/m.  Advanced Directives 06/10/2018 06/10/2017 02/26/2017 02/12/2017 08/05/2016 02/10/2014 08/02/2012  Does Patient Have a Medical Advance Directive? No No No No No Patient does not have advance directive;Patient would like information Patient does not have advance directive  Would patient like information on creating a medical advance directive? No - Patient declined Yes (MAU/Ambulatory/Procedural Areas - Information given) - No - Patient declined Yes (ED - Information included in AVS) Advance directive packet given -    Tobacco Social History   Tobacco Use  Smoking Status Never Smoker  Smokeless Tobacco Never Used     Counseling given: Not Answered   Clinical Intake:  Pre-visit preparation completed: Yes  Pain : 0-10 Pain Score: 2  Pain Type: Acute pain Pain Location: Knee Pain Orientation: Right Pain Descriptors / Indicators: Aching Pain Onset: In the past 7 days Pain Frequency: Intermittent Pain Relieving Factors: 800mg  ibuprophen   Pain Relieving Factors: 800mg  ibuprophen   BMI - recorded: 34 Nutritional Status: BMI > 30  Obese Nutritional Risks: Unintentional weight loss Diabetes: No  How often do you need to have someone help you when you read instructions, pamphlets, or other written materials from your doctor or pharmacy?: 1 - Never What is the last grade level you completed in school?: 12 grade   Interpreter Needed?: No  Information entered by :: Francena Hanly LPN  Past Medical History:  Diagnosis Date  . Allergic rhinitis   . Arthritis HIPS/ WRISTS    . DDD (degenerative disc disease), cervical   . GERD (gastroesophageal reflux disease) OCCASIONAL  . Heart murmur ASYMPTOMATIC  . History of Bell's palsy 2011  RIGHT SIDE -- RESOLVED  . 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 History of CVA (cerebrovascular accident) PER SCAN IN 2011  . Hyperlipidemia   . Hypertension   . Impaired fasting glucose PER PCP  DR SIMPSON   WATCH DIET  . Mixed urge and stress incontinence   . Nocturia   . Sinus drainage   . Thoracic ascending aortic aneurysm ([place]) LAST CHEST CT 09-02-2010--  FOLLOWED BY  CARDIOLOGIST--  DR [id number]  . Vaginal wall prolapse    Past Surgical History:  Procedure Laterality Date  . BILATERAL CARPAL TUNNEL RELEASE  1980'S  . CARDIAC CATHETERIZATION  02-01-2009  DR EICHHORN   NORMAL CORONARY ARTERIES/ NORMAL LV SIZE AND FUNCTION/ AORTIC ROOT SIZE AT THE UPPER LIMIT OF NORMAL   . CARPAL TUNNEL RELEASE    . CERVICAL DISC SURGERY  12-12-2005  DR Vertell Limber   LEFT  C6 - C7 HERINATED / DDD/ SPONDYLOSIS  . COLONOSCOPY N/A 02/10/2014   Procedure: COLONOSCOPY;  Surgeon: Danie Binder, MD;  [facility];  Service: Endoscopy;  Laterality: N/A;  8:30  . CYSTOCELE REPAIR  08/02/2012   Procedure: ANTERIOR REPAIR (CYSTOCELE);  Surgeon: Ailene Rud, MD;  Location: [facility];  Service: Urology;  Laterality: N/A;  Hardy Anterior Pelvic  Floor Sacrospinus Repair. Anterior wall of the vagina.  . ESOPHAGOGASTRODUODENOSCOPY N/A 02/10/2014   Procedure: ESOPHAGOGASTRODUODENOSCOPY (EGD);  Surgeon: Danie Binder, MD;  [facility];  Service: Endoscopy;  Laterality: N/A;  . GIVENS CAPSULE STUDY N/A 03/13/2014   Procedure: GIVENS CAPSULE STUDY;  Surgeon: Danie Binder, MD;  [facility];  Service: Endoscopy;  Laterality: N/A;  7:30  . NECK SURGERY  2007  . RIGHT PARTIAL MASTECTOMY  11-11-2004  DR [name] YOUNG    DUCTAL CARCINOMA IN SITU RIGHT BREAST  . TOTAL ABDOMINAL HYSTERECTOMY W/ BILATERAL SALPINGOOPHORECTOMY  1990  . TRANSTHORACIC ECHOCARDIOGRAM  03-30-2008  DR MARGARET SIMPSON   LV SIZE AND FUNCTION NORMAL/ MODERATE AORTIC ARCH DILATATION/ MILD MR   Family History  Problem Relation Age of Onset  . Kidney failure Mother   . Hypertension Mother   . Heart attack Mother 15  . Throat cancer Father   . Prostate cancer Father   . Lung cancer Father 72  . Breast cancer Sister   . Hypertension Brother        2 stents  . Colon cancer Neg Hx   . Colon polyps Neg Hx    Social History   Socioeconomic History  . Marital status: Divorced    Spouse name: Not on file  . Number of children: 4  . Years of education: 12+  . Highest education level: 12th grade  Occupational History  . Not on file  Social Needs  . Financial resource strain: Not hard at all  . Food insecurity:    Worry: Never true    Inability: Never true  . Transportation needs:    Medical: No    Non-medical: No  Tobacco Use  . Smoking status: Never Smoker  . Smokeless tobacco: Never Used  Substance and Sexual Activity  . Alcohol use: No  . Drug use: No  . Sexual activity: Not Currently  Lifestyle  . Physical activity:    Days per week: 0 days    Minutes per session: 0 min  . Stress: Not at all  Relationships  . Social connections:    Talks on phone: More than three times a week    Gets together: Once a week    Attends religious service: 1 to 4 times per year    Active member of club or organization: Yes    Attends meetings of clubs or organizations: 1 to 4 times per year    Relationship status: Divorced  Other Topics Concern  . Not on file  Social History Narrative   RETIRED FROM [employer]. 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    Outpatient Encounter Medications as of 06/14/2018  Medication Sig  . acetaminophen (TYLENOL) 650 MG CR tablet Take 650 mg by mouth every 8 (eight) hours as needed for pain.  . Ascorbic Acid  (VITAMIN C) 1000 MG tablet Take 1,000 mg by mouth daily.  Marland Kitchen aspirin EC 325 MG tablet Take 1 tablet (325 mg total) by mouth daily.  . calcium-vitamin D (OSCAL 500/200 D-3) 500-200 MG-UNIT per tablet Take 1 tablet by mouth daily with breakfast.   . carvedilol (COREG) 3.125 MG tablet Take 1 tablet (3.125 mg total) by mouth 2 (two) times daily with a meal.  . cholecalciferol (VITAMIN D) 1000 UNITS tablet Take 1,000 Units by mouth daily.  Marland Kitchen gabapentin (NEURONTIN) 100 MG capsule TAKE TWO CAPSULES BY MOUTH ONCE DAILY AT BEDTIME  . hydrochlorothiazide (HYDRODIURIL) 25 MG tablet TAKE 1 TABLET BY MOUTH ONCE  DAILY IN THE MORNING  . ibuprofen (ADVIL,MOTRIN) 800 MG tablet Take 1 tablet (800 mg total) by mouth 3 (three) times daily.  Marland Kitchen losartan (COZAAR) 50 MG tablet TAKE 1/2 (ONE-HALF) TABLET BY MOUTH ONCE DAILY  . lovastatin (MEVACOR) 40 MG tablet TAKE 1 TABLET BY MOUTH ONCE DAILY WITH BREAKFAST  . montelukast (SINGULAIR) 10 MG tablet Take 1 tablet (10 mg total) by mouth at bedtime.  . Multiple Vitamin (MULTIVITAMIN) tablet Take 1 tablet by mouth daily.  . solifenacin (VESICARE) 10 MG tablet Take 10 mg by mouth daily.  . potassium chloride SA (K-DUR,KLOR-CON) 20 MEQ tablet Take 10 mEq by mouth daily.  . [DISCONTINUED] amLODipine (NORVASC) 10 MG tablet Take 1 tablet (10 mg total) by mouth daily.   No facility-administered encounter medications on file as of 06/14/2018.     Activities of Daily Living In your present state of health, do you have any difficulty performing the following activities: 06/14/2018  Hearing? N  Vision? Y  Difficulty concentrating or making decisions? N  Walking or climbing stairs? N  Dressing or bathing? N  Doing errands, shopping? N  Preparing Food and eating ? N  Using the Toilet? N  In the past six months, have you accidently leaked urine? Y  Do you have problems with loss of bowel control? N  Managing your Medications? N  Managing your Finances? N  Housekeeping or  managing your Housekeeping? N  Some recent data might be hidden    Patient Care Team: [name], MD as PCP - General Grace Isaac, MD as Consulting Physician (Cardiothoracic Surgery) Croitoru, Dani Gobble, MD as Consulting Physician (Cardiology) Hortencia Pilar, MD as Consulting Physician (Ophthalmology) Carolan Clines, MD as Consulting Physician (Urology)    Assessment:   This is a routine wellness examination for Desiree [name].  Exercise Activities and Dietary recommendations Current Exercise Habits: The patient does not participate in regular exercise at present, Exercise limited by: orthopedic condition(s)  Goals      Patient Stated   . Cut out extra servings (pt-stated)     I would love to talk with a dietician and learn to eat better       Other   . Increase physical activity    . Weight (lb) < 175 lb (79.4 kg)     Starting 08/05/2016 patient would like to maintain her current exercise routine and slowly increase to 40 minutes 3 times a week.       Fall Risk Fall Risk  06/14/2018 06/07/2018 10/28/2017 06/10/2017 01/14/2017  Falls in the past year? Yes Yes No No No  Number falls in past yr: 1 1 - - -  Injury with Fall? Yes Yes - - -  Risk for fall due to : Impaired balance/gait;Impaired vision - - - -  Follow up Falls prevention discussed - - - -   Is the patient's home free of loose throw rugs in walkways, pet beds, electrical cords, etc?   no      Grab bars in the bathroom? yes      Handrails on the stairs?   yes      Adequate lighting?   yes  Timed Get Up and Go performed: Patient able to perform in 6 seconds without assistance  Depression Screen PHQ 2/9 Scores 06/14/2018 06/07/2018 02/08/2018 06/10/2017  PHQ - 2 Score 0 0 0 0  PHQ- 9 Score - - 3 -     Cognitive Function MMSE - Mini Mental State Exam 09/21/2014  Orientation to time 5  Orientation to Place 5  Registration 3  Attention/ Calculation 4  Recall 3  Language- name 2 objects 2    Language- repeat 1  Language- follow 3 step command 2  Language- read & follow direction 1  Write a sentence 1  Copy design 1  Total score 28     6CIT Screen 06/14/2018 06/10/2017 08/05/2016  What Year? 0 points 0 points 0 points  What month? 0 points 0 points 0 points  What time? 0 points 0 points 0 points  Count back from 20 0 points 0 points 0 points  Months in reverse 0 points 0 points 0 points  Repeat phrase 2 points 0 points 0 points  Total Score 2 0 0    Immunization History  Administered Date(s) Administered  . Influenza Split 07/15/2011, 06/30/2012  . Influenza Whole 06/13/2010  . Influenza,inj,Quad PF,6+ Mos 07/13/2013, 05/22/2014, 06/12/2015, 07/08/2016, 05/27/2017, 05/05/2018  . Pneumococcal Conjugate-13 09/21/2014  . Pneumococcal Polysaccharide-23 11/11/2011  . Tdap 07/15/2011  . Zoster 11/09/2006    Qualifies for Shingles Vaccine?up to date   Screening Tests Health Maintenance  Topic Date Due  . MAMMOGRAM  01/22/2020  . TETANUS/TDAP  07/14/2021  . COLONOSCOPY  02/13/2024  . INFLUENZA VACCINE  Completed  . DEXA SCAN  Completed  . Hepatitis C Screening  Completed  . PNA vac Low Risk Adult  Completed    Cancer Screenings: Lung: Low Dose CT Chest recommended if Age 53-80 years, 30 pack-year currently smoking OR have quit w/in 15years. Patient does not qualify. Breast:  Up to date on Mammogram? Yes   Up to date of Bone Density/Dexa? Yes Colorectal: up to date   Additional Screenings: Hepatitis C Screening: complete      Plan:   Plan to increase physical activity, drink more water. Meet with a dietician and eat healthier   I have personally reviewed and noted the following in the patient's chart:   . Medical and social history . Use of alcohol, tobacco or illicit drugs  . Current medications and supplements . Functional ability and status . Nutritional status . Physical activity . Advanced directives . List of other  physicians . Hospitalizations, surgeries, and ER visits in previous 12 months . Vitals . Screenings to include cognitive, depression, and falls . Referrals and appointments  In addition, I have reviewed and discussed with patient certain preventive protocols, quality metrics, and best practice recommendations. A written personalized care plan for preventive services as well as general preventive health recommendations were provided to patient.     Francoise Schaumann, LPN  38/05/1750

## 2018-06-14 NOTE — Patient Instructions (Signed)
Desiree [name] , Thank you for taking time to come for your Medicare Wellness Visit. I appreciate your ongoing commitment to your health goals. Please review the following plan we discussed and let me know if I can assist you in the future.   Screening recommendations/referrals: Colonoscopy: up to date  Mammogram: up to date  Bone Density: up to date  Recommended yearly ophthalmology/optometry visit for glaucoma screening and checkup Recommended yearly dental visit for hygiene and checkup  Vaccinations: Influenza vaccine:up to date  Pneumococcal vaccine: up to date  Tdap vaccine: up to date  Shingles vaccine: up to date     Advanced directives: not in place , but has paperwork   Conditions/risks identified: recent fall current knee pain , impaired vision   Next appointment: wellness visit in one year    Preventive Care 63 Years and Older, Female Preventive care refers to lifestyle choices and visits with your health care provider that can promote health and wellness. What does preventive care include?  A yearly physical exam. This is also called an annual well check.  Dental exams once or twice a year.  Routine eye exams. Ask your health care provider how often you should have your eyes checked.  Personal lifestyle choices, including:  Daily care of your teeth and gums.  Regular physical activity.  Eating a healthy diet.  Avoiding tobacco and drug use.  Limiting alcohol use.  Practicing safe sex.  Taking low-dose aspirin every day.  Taking vitamin and mineral supplements as recommended by your health care provider. What happens during an annual well check? The services and screenings done by your health care provider during your annual well check will depend on your age, overall health, lifestyle risk factors, and family history of disease. Counseling  Your health care provider may ask you questions about your:  Alcohol use.  Tobacco use.  Drug use.  Emotional  well-being.  Home and relationship well-being.  Sexual activity.  Eating habits.  History of falls.  Memory and ability to understand (cognition).  Work and work Statistician.  Reproductive health. Screening  You may have the following tests or measurements:  Height, weight, and BMI.  Blood pressure.  Lipid and cholesterol levels. These may be checked every 5 years, or more frequently if you are over 21 years old.  Skin check.  Lung cancer screening. You may have this screening every year starting at age 1 if you have a 30-pack-year history of smoking and currently smoke or have quit within the past 15 years.  Fecal occult blood test (FOBT) of the stool. You may have this test every year starting at age 36.  Flexible sigmoidoscopy or colonoscopy. You may have a sigmoidoscopy every 5 years or a colonoscopy every 10 years starting at age 36.  Hepatitis C blood test.  Hepatitis B blood test.  Sexually transmitted disease (STD) testing.  Diabetes screening. This is done by checking your blood sugar (glucose) after you have not eaten for a while (fasting). You may have this done every 1-3 years.  Bone density scan. This is done to screen for osteoporosis. You may have this done starting at age 45.  Mammogram. This may be done every 1-2 years. Talk to your health care provider about how often you should have regular mammograms. Talk with your health care provider about your test results, treatment options, and if necessary, the need for more tests. Vaccines  Your health care provider may recommend certain vaccines, such as:  Influenza vaccine.  This is recommended every year.  Tetanus, diphtheria, and acellular pertussis (Tdap, Td) vaccine. You may need a Td booster every 10 years.  Zoster vaccine. You may need this after age 30.  Pneumococcal 13-valent conjugate (PCV13) vaccine. One dose is recommended after age 57.  Pneumococcal polysaccharide (PPSV23) vaccine. One  dose is recommended after age 44. Talk to your health care provider about which screenings and vaccines you need and how often you need them. This information is not intended to replace advice given to you by your health care provider. Make sure you discuss any questions you have with your health care provider. Document Released: 09/07/2015 Document Revised: 04/30/2016 Document Reviewed: 06/12/2015 Elsevier Interactive Patient Education  2017 [place] Prevention in the Home Falls can cause injuries. They can happen to people of all ages. There are many things you can do to make your home safe and to help prevent falls. What can I do on the outside of my home?  Regularly fix the edges of walkways and driveways and fix any cracks.  Remove anything that might make you trip as you walk through a door, such as a raised step or threshold.  Trim any bushes or trees on the path to your home.  Use bright outdoor lighting.  Clear any walking paths of anything that might make someone trip, such as rocks or tools.  Regularly check to see if handrails are loose or broken. Make sure that both sides of any steps have handrails.  Any raised decks and porches should have guardrails on the edges.  Have any leaves, snow, or ice cleared regularly.  Use sand or salt on walking paths during winter.  Clean up any spills in your garage right away. This includes oil or grease spills. What can I do in the bathroom?  Use night lights.  Install grab bars by the toilet and in the tub and shower. Do not use towel bars as grab bars.  Use non-skid mats or decals in the tub or shower.  If you need to sit down in the shower, use a plastic, non-slip stool.  Keep the floor dry. Clean up any water that spills on the floor as soon as it happens.  Remove soap buildup in the tub or shower regularly.  Attach bath mats securely with double-sided non-slip rug tape.  Do not have throw rugs and other  things on the floor that can make you trip. What can I do in the bedroom?  Use night lights.  Make sure that you have a light by your bed that is easy to reach.  Do not use any sheets or blankets that are too big for your bed. They should not hang down onto the floor.  Have a firm chair that has side arms. You can use this for support while you get dressed.  Do not have throw rugs and other things on the floor that can make you trip. What can I do in the kitchen?  Clean up any spills right away.  Avoid walking on wet floors.  Keep items that you use a lot in easy-to-reach places.  If you need to reach something above you, use a strong step stool that has a grab bar.  Keep electrical cords out of the way.  Do not use floor polish or wax that makes floors slippery. If you must use wax, use non-skid floor wax.  Do not have throw rugs and other things on the floor that can make  you trip. What can I do with my stairs?  Do not leave any items on the stairs.  Make sure that there are handrails on both sides of the stairs and use them. Fix handrails that are broken or loose. Make sure that handrails are as long as the stairways.  Check any carpeting to make sure that it is firmly attached to the stairs. Fix any carpet that is loose or worn.  Avoid having throw rugs at the top or bottom of the stairs. If you do have throw rugs, attach them to the floor with carpet tape.  Make sure that you have a light switch at the top of the stairs and the bottom of the stairs. If you do not have them, ask someone to add them for you. What else can I do to help prevent falls?  Wear shoes that:  Do not have high heels.  Have rubber bottoms.  Are comfortable and fit you well.  Are closed at the toe. Do not wear sandals.  If you use a stepladder:  Make sure that it is fully opened. Do not climb a closed stepladder.  Make sure that both sides of the stepladder are locked into place.  Ask  someone to hold it for you, if possible.  Clearly mark and make sure that you can see:  Any grab bars or handrails.  First and last steps.  Where the edge of each step is.  Use tools that help you move around (mobility aids) if they are needed. These include:  Canes.  Walkers.  Scooters.  Crutches.  Turn on the lights when you go into a dark area. Replace any light bulbs as soon as they burn out.  Set up your furniture so you have a clear path. Avoid moving your furniture around.  If any of your floors are uneven, fix them.  If there are any pets around you, be aware of where they are.  Review your medicines with your doctor. Some medicines can make you feel dizzy. This can increase your chance of falling. Ask your doctor what other things that you can do to help prevent falls. This information is not intended to replace advice given to you by your health care provider. Make sure you discuss any questions you have with your health care provider. Document Released: 06/07/2009 Document Revised: 01/17/2016 Document Reviewed: 09/15/2014 Elsevier Interactive Patient Education  2017 [employer].

## 2018-06-15 ENCOUNTER — Ambulatory Visit (HOSPITAL_COMMUNITY): Payer: PPO

## 2018-06-15 ENCOUNTER — Encounter (HOSPITAL_COMMUNITY): Payer: Self-pay

## 2018-06-15 DIAGNOSIS — M25661 Stiffness of right knee, not elsewhere classified: Secondary | ICD-10-CM

## 2018-06-15 DIAGNOSIS — M6281 Muscle weakness (generalized): Secondary | ICD-10-CM

## 2018-06-15 DIAGNOSIS — R2689 Other abnormalities of gait and mobility: Secondary | ICD-10-CM

## 2018-06-15 DIAGNOSIS — M25561 Pain in right knee: Secondary | ICD-10-CM | POA: Diagnosis not present

## 2018-06-15 DIAGNOSIS — G8929 Other chronic pain: Secondary | ICD-10-CM

## 2018-06-15 NOTE — Therapy (Signed)
[place], [place], [address] Phone: [phone]   Fax:  [phone]  Physical Therapy Treatment  Patient Details  Name: Desiree [name] MRN: [id number] Date of Birth: 1946/06/02 Referring Provider (PT): [name], MD   Encounter Date: 06/15/2018  PT End of Session - 06/15/18 1438    Visit Number  2    Number of Visits  13    Date for PT Re-Evaluation  07/22/18    Authorization Type  [employer] (no visit limit, no auth required)    Authorization Time Period  06/10/18 - 07/23/18    Authorization - Visit Number  2    Authorization - Number of Visits  13    PT Start Time  9417    PT Stop Time  1513    PT Time Calculation (min)  40 min    Activity Tolerance  Patient tolerated treatment well;No increased pain    Behavior During Therapy  WFL for tasks assessed/performed       Past Medical History:  Diagnosis Date  . Allergic rhinitis   . Arthritis HIPS/ WRISTS  . DDD (degenerative disc disease), cervical   . GERD (gastroesophageal reflux disease) OCCASIONAL  . Heart murmur ASYMPTOMATIC  . History of Bell's palsy 2011  RIGHT SIDE -- RESOLVED  . 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 History of CVA (cerebrovascular accident) PER SCAN IN 2011  . Hyperlipidemia   . Hypertension   . Impaired fasting glucose PER PCP  DR SIMPSON   WATCH DIET  . Mixed urge and stress incontinence   . Nocturia   . Sinus drainage   . Thoracic ascending aortic aneurysm ([place]) LAST CHEST CT 09-02-2010--  FOLLOWED BY  CARDIOLOGIST--  DR [id number]  . Vaginal wall prolapse     Past Surgical History:  Procedure Laterality Date  . BILATERAL CARPAL TUNNEL RELEASE  1980'S  . CARDIAC CATHETERIZATION  02-01-2009  DR EICHHORN   NORMAL CORONARY ARTERIES/ NORMAL LV SIZE AND FUNCTION/ AORTIC ROOT SIZE AT THE UPPER LIMIT OF NORMAL   . CARPAL TUNNEL  RELEASE    . CERVICAL DISC SURGERY  12-12-2005  DR Vertell Limber   LEFT  C6 - C7 HERINATED / DDD/ SPONDYLOSIS  . COLONOSCOPY N/A 02/10/2014   Procedure: COLONOSCOPY;  Surgeon: Danie Binder, MD;  [facility];  Service: Endoscopy;  Laterality: N/A;  8:30  . CYSTOCELE REPAIR  08/02/2012   Procedure: ANTERIOR REPAIR (CYSTOCELE);  Surgeon: Ailene Rud, MD;  Location: [facility];  Service: Urology;  Laterality: N/A;  [employer] Uphold Anterior Pelvic Floor Sacrospinus Repair. Anterior wall of the vagina.  . ESOPHAGOGASTRODUODENOSCOPY N/A 02/10/2014   Procedure: ESOPHAGOGASTRODUODENOSCOPY (EGD);  Surgeon: Danie Binder, MD;  [facility];  Service: Endoscopy;  Laterality: N/A;  . GIVENS CAPSULE STUDY N/A 03/13/2014   Procedure: GIVENS CAPSULE STUDY;  Surgeon: Danie Binder, MD;  [facility];  Service: Endoscopy;  Laterality: N/A;  7:30  . NECK SURGERY  2007  . RIGHT PARTIAL MASTECTOMY  11-11-2004  DR [name] YOUNG   DUCTAL CARCINOMA IN SITU RIGHT BREAST  . TOTAL ABDOMINAL HYSTERECTOMY W/ BILATERAL SALPINGOOPHORECTOMY  1990  . TRANSTHORACIC ECHOCARDIOGRAM  03-30-2008  DR MARGARET SIMPSON   LV SIZE AND FUNCTION NORMAL/ MODERATE AORTIC ARCH DILATATION/ MILD MR    There were no vitals filed for this  visit.                    [facility] Adult PT Treatment/Exercise - 06/15/18 0001      Exercises   Exercises  Knee/Hip      Knee/Hip Exercises: Stretches   Active Hamstring Stretch  Both;2 reps;30 seconds    Active Hamstring Stretch Limitations  resolve cramp      Knee/Hip Exercises: Standing   SLS  5x  Lt 17", Rt 20"      Knee/Hip Exercises: Seated   Sit to Sand  10 reps;without UE support      Knee/Hip Exercises: Supine   Heel Slides  10 reps    Heel Slides Limitations  BLE    Bridges  2 sets;10 reps    Bridges Limitations  3 sec holds    Straight Leg Raises  Both;1 set;10 reps    Straight Leg Raises Limitations  quad set  before      Knee/Hip Exercises: Sidelying   Hip ABduction Limitations  attempted c/o Rt lateral pain    Clams  10x 5" RTB      Manual Therapy   Manual Therapy  Joint mobilization    Manual therapy comments  Manual complete separate than rest of tx    Joint Mobilization  grade I-II patella mob and tib/fib for mobility and pain             PT Education - 06/15/18 1449    Education Details  Reviewed goals and assured compliance wtih HEP.      Person(s) Educated  Patient    Methods  Explanation;Demonstration    Comprehension  Verbalized understanding;Returned demonstration       PT Short Term Goals - 06/10/18 1602      PT SHORT TERM GOAL #1   Title  Patient will be independe with HEP, updated PRN, to improve funcitonal ROM and strength for bil LE's to improve activity tolerance.     Time  2    Period  Weeks    Status  New    Target Date  06/24/18      PT SHORT TERM GOAL #2   Title  Patient will improve MMT by 1/2 grade for limited groups to demonstrate significnat improvement in LE function and improve endurance with walking and and daily activities.     Time  3    Period  Weeks    Status  New    Target Date  07/01/18      PT SHORT TERM GOAL #3   Title  Patient will perform SLS for 15 seconds on bil LE to indicate improved balance and reduced fall risk with gait and stair mobility.    Time  3    Period  Weeks    Status  New        PT Long Term Goals - 06/10/18 1607      PT LONG TERM GOAL #1   Title  Patient will improve MMT by 1 grade for limited groups to demonstrate significnat improvement in LE function and improve endurance with walking and and daily activities.     Time  6    Period  Weeks    Status  New    Target Date  07/22/18      PT LONG TERM GOAL #2   Title  Patient will ambulate at = or > 1.0 m/s with LRAD during 2MWT to demonstrate safe community walking speed, reduced fall risk, and improved  community access.    Time  6    Period  Weeks     Status  New      PT LONG TERM GOAL #3   Title  Patient will improve AROM for bil knees to 0-120 to obtain functional ROM without pain for improved quality and performance of gait and stairs ambulation.    Time  6    Period  Weeks    Status  New      PT LONG TERM GOAL #4   Title  Patient will reports no pain for entire week while participating in HEP and walking program of 30 minutes or more, 3x/week to improve participation in regular exercise with no increased pain.     Time  6    Period  Weeks    Status  New            Plan - 06/15/18 1513    Clinical Impression Statement  Reviewed goals and assured complaince with HEP.  Session focus on hip strengthening and knee mobility to reduce pain.  Pt able to complete all exercises with proper form, was limited was fatigue with tasks.  Pt reports of lateral knee pain wiht sidelying abduction.  Manual joint mobs complete Bil knees to address mobility, noted limited Rt patella mobility, reports pain reduced following mobs.  EOS discussed tx, pt stated she was interested in aquatic therapy, discussed with evaluation therapist to add intervention to POC.    Rehab Potential  Good    PT Frequency  2x / week    PT Duration  6 weeks    PT Treatment/Interventions  ADLs/Self Care Home Management;Cryotherapy;Electrical Stimulation;Moist Heat;Gait training;Stair training;Functional mobility training;Therapeutic activities;Therapeutic exercise;Balance training;Neuromuscular re-education;Patient/family education;Manual techniques;Passive range of motion;Taping    PT Next Visit Plan  Continue joint mobilization for pain, grade I/II to bil knee joints. Perform exercises for hip and LE strengthing in supine and progress to standing.    PT Home Exercise Plan  Eval: bridge, SLR       Patient will benefit from skilled therapeutic intervention in order to improve the following deficits and impairments:  Abnormal gait, Pain, Decreased mobility, Postural  dysfunction, Decreased activity tolerance, Decreased endurance, Decreased range of motion, Decreased strength, Hypomobility, Impaired flexibility, Increased edema, Difficulty walking, Decreased balance  Visit Diagnosis: Chronic pain of right knee  Stiffness of right knee, not elsewhere classified  Other abnormalities of gait and mobility  Muscle weakness (generalized)     Problem List Patient Active Problem List   Diagnosis Date Noted  . Fall at home, subsequent encounter 06/07/2018  . Knee pain, right 05/31/2017  . CKD (chronic kidney disease) stage 3, GFR 30-59 ml/min (HCC) 10/01/2016  . 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 Urinary incontinence 05/22/2014  . Metabolic syndrome X 99/35/7017  . Osteopenia 12/12/2013  . Anemia 11/29/2013  . Arthritis of knee, right 02/21/2013  . Obesity (BMI 30-39.9) 10/17/2012  . Thoracic aortic aneurysm ([place]) 10/12/2012  . Allergic rhinitis 03/02/2012  . BLADDER PROLAPSE 09/24/2010  . NEOPLASM, MALIGNANT, BREAST, RIGHT 02/04/2008  . Hyperlipidemia LDL goal <100 02/04/2008  . Essential hypertension 02/04/2008   Ihor Austin, [place]; [place]  Aldona Lento 06/15/2018, 3:32 PM  [facility] [address] [place], [place], [address] Phone: [phone]   Fax:  [phone]  Name: JOLA CRITZER MRN: [id number] Date of Birth: [birth year]

## 2018-06-16 ENCOUNTER — Ambulatory Visit (HOSPITAL_COMMUNITY): Payer: PPO

## 2018-06-16 ENCOUNTER — Encounter (HOSPITAL_COMMUNITY): Payer: Self-pay

## 2018-06-16 DIAGNOSIS — M6281 Muscle weakness (generalized): Secondary | ICD-10-CM

## 2018-06-16 DIAGNOSIS — M25561 Pain in right knee: Secondary | ICD-10-CM | POA: Diagnosis not present

## 2018-06-16 DIAGNOSIS — M25661 Stiffness of right knee, not elsewhere classified: Secondary | ICD-10-CM

## 2018-06-16 DIAGNOSIS — G8929 Other chronic pain: Secondary | ICD-10-CM

## 2018-06-16 DIAGNOSIS — E559 Vitamin D deficiency, unspecified: Secondary | ICD-10-CM | POA: Diagnosis not present

## 2018-06-16 DIAGNOSIS — N183 Chronic kidney disease, stage 3 (moderate): Secondary | ICD-10-CM | POA: Diagnosis not present

## 2018-06-16 DIAGNOSIS — E669 Obesity, unspecified: Secondary | ICD-10-CM | POA: Diagnosis not present

## 2018-06-16 DIAGNOSIS — R2689 Other abnormalities of gait and mobility: Secondary | ICD-10-CM

## 2018-06-16 DIAGNOSIS — D638 Anemia in other chronic diseases classified elsewhere: Secondary | ICD-10-CM | POA: Diagnosis not present

## 2018-06-16 NOTE — Addendum Note (Signed)
Addended by: Debara Pickett on: 06/16/2018 05:46 PM   Modules accepted: Orders

## 2018-06-16 NOTE — Patient Instructions (Signed)
Single Leg Balance: Eyes Open    Stand on right leg with eyes open. Hold 30 seconds. 5 reps 2 times per day.  http://ggbe.exer.us/5   Copyright  V[employer]. All rights reserved.

## 2018-06-16 NOTE — Therapy (Signed)
[place], [place], [address] Phone: [phone]   Fax:  [phone]  Physical Therapy Treatment  Patient Details  Name: Desiree [name] MRN: [id number] Date of Birth: 1946-07-21 Referring Provider (PT): [name], MD   Encounter Date: 06/16/2018  PT End of Session - 06/16/18 1527    Visit Number  3    Number of Visits  13    Date for PT Re-Evaluation  07/22/18   [place] 07/01/2018   Authorization Type  [employer] (no visit limit, no auth required)    Authorization Time Period  06/10/18 - 07/23/18    Authorization - Visit Number  3    Authorization - Number of Visits  13    PT Start Time  5956    PT Stop Time  3875   EOS on bike, not included with charges   PT Time Calculation (min)  43 min    Activity Tolerance  Patient tolerated treatment well;No increased pain    Behavior During Therapy  WFL for tasks assessed/performed       Past Medical History:  Diagnosis Date  . Allergic rhinitis   . Arthritis HIPS/ WRISTS  . DDD (degenerative disc disease), cervical   . GERD (gastroesophageal reflux disease) OCCASIONAL  . Heart murmur ASYMPTOMATIC  . History of Bell's palsy 2011  RIGHT SIDE -- RESOLVED  . 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 History of CVA (cerebrovascular accident) PER SCAN IN 2011  . Hyperlipidemia   . Hypertension   . Impaired fasting glucose PER PCP  DR SIMPSON   WATCH DIET  . Mixed urge and stress incontinence   . Nocturia   . Sinus drainage   . Thoracic ascending aortic aneurysm ([place]) LAST CHEST CT 09-02-2010--  FOLLOWED BY  CARDIOLOGIST--  DR IEPPIRJJ  . Vaginal wall prolapse     Past Surgical History:  Procedure Laterality Date  . BILATERAL CARPAL TUNNEL RELEASE  1980'S  . CARDIAC CATHETERIZATION  02-01-2009  DR EICHHORN   NORMAL CORONARY ARTERIES/ NORMAL LV SIZE AND FUNCTION/  AORTIC ROOT SIZE AT THE UPPER LIMIT OF NORMAL   . CARPAL TUNNEL RELEASE    . CERVICAL DISC SURGERY  12-12-2005  DR Vertell Limber   LEFT  C6 - C7 HERINATED / DDD/ SPONDYLOSIS  . COLONOSCOPY N/A 02/10/2014   Procedure: COLONOSCOPY;  Surgeon: Danie Binder, MD;  [facility];  Service: Endoscopy;  Laterality: N/A;  8:30  . CYSTOCELE REPAIR  08/02/2012   Procedure: ANTERIOR REPAIR (CYSTOCELE);  Surgeon: Ailene Rud, MD;  Location: [facility];  Service: Urology;  Laterality: N/A;  [employer] Uphold Anterior Pelvic Floor Sacrospinus Repair. Anterior wall of the vagina.  . ESOPHAGOGASTRODUODENOSCOPY N/A 02/10/2014   Procedure: ESOPHAGOGASTRODUODENOSCOPY (EGD);  Surgeon: Danie Binder, MD;  [facility];  Service: Endoscopy;  Laterality: N/A;  . GIVENS CAPSULE STUDY N/A 03/13/2014   Procedure: GIVENS CAPSULE STUDY;  Surgeon: Danie Binder, MD;  [facility];  Service: Endoscopy;  Laterality: N/A;  7:30  . NECK SURGERY  2007  . RIGHT PARTIAL MASTECTOMY  11-11-2004  DR [name] YOUNG   DUCTAL CARCINOMA IN SITU RIGHT BREAST  . TOTAL ABDOMINAL HYSTERECTOMY W/ BILATERAL SALPINGOOPHORECTOMY  1990  . TRANSTHORACIC ECHOCARDIOGRAM  03-30-2008  DR MARGARET SIMPSON   LV SIZE AND FUNCTION NORMAL/ MODERATE AORTIC ARCH DILATATION/ MILD  MR    There were no vitals filed for this visit.  Subjective Assessment - 06/16/18 1525    Subjective  Pt stated she completed her exercises last night and this morning before getting out of bed.  Rt knee feels a toothache, pain scale 3/10.      Pertinent History  fall on 06/04/18    Patient Stated Goals  to improve walking and reduce pain    Currently in Pain?  Yes    Pain Score  3     Pain Location  Knee    Pain Orientation  Right    Pain Descriptors / Indicators  Aching   tooth ache   Pain Type  Acute pain    Pain Onset  In the past 7 days    Pain Frequency  Intermittent    Aggravating Factors   standing or sitting  prolonged     Pain Relieving Factors  800mg  ibuprophen                       [facility] Adult PT Treatment/Exercise - 06/16/18 0001      Exercises   Exercises  Knee/Hip      Knee/Hip Exercises: Stretches   Knee: Self-Stretch to increase Flexion  Right;10 seconds    Knee: Self-Stretch Limitations  knee drive on 2nd step 86P 10" for mobility      Knee/Hip Exercises: Aerobic   Stationary Bike  4' on bike,not included with charges      Knee/Hip Exercises: Standing   Hip Abduction  Both;10 reps;Knee straight    Abduction Limitations  RTB 3" holds    Hip Extension  Both;10 reps;Knee straight    Extension Limitations  RTB 3" holds    SLS  5x Lt and Rt 20"    Other Standing Knee Exercises  tandem stance 2x 30" foam       Knee/Hip Exercises: Seated   Sit to Sand  10 reps;without UE support      Knee/Hip Exercises: Supine   Heel Slides  10 reps    Heel Slides Limitations  BLE    Bridges  2 sets;10 reps    Bridges Limitations  3 sec holds    Straight Leg Raises  Both;1 set;15 reps    Straight Leg Raises Limitations  quad set before      Knee/Hip Exercises: Sidelying   Clams  10x 5" RTB      Manual Therapy   Manual Therapy  Joint mobilization    Manual therapy comments  Manual complete separate than rest of tx    Joint Mobilization  grade I-II patella mob and tib/fib for mobility and pain               PT Short Term Goals - 06/10/18 1602      PT SHORT TERM GOAL #1   Title  Patient will be independe with HEP, updated PRN, to improve funcitonal ROM and strength for bil LE's to improve activity tolerance.     Time  2    Period  Weeks    Status  New    Target Date  06/24/18      PT SHORT TERM GOAL #2   Title  Patient will improve MMT by 1/2 grade for limited groups to demonstrate significnat improvement in LE function and improve endurance with walking and and daily activities.     Time  3    Period  Weeks    Status  New    Target Date  07/01/18      PT  SHORT TERM GOAL #3   Title  Patient will perform SLS for 15 seconds on bil LE to indicate improved balance and reduced fall risk with gait and stair mobility.    Time  3    Period  Weeks    Status  New        PT Long Term Goals - 06/10/18 1607      PT LONG TERM GOAL #1   Title  Patient will improve MMT by 1 grade for limited groups to demonstrate significnat improvement in LE function and improve endurance with walking and and daily activities.     Time  6    Period  Weeks    Status  New    Target Date  07/22/18      PT LONG TERM GOAL #2   Title  Patient will ambulate at = or > 1.0 m/s with LRAD during 2MWT to demonstrate safe community walking speed, reduced fall risk, and improved community access.    Time  6    Period  Weeks    Status  New      PT LONG TERM GOAL #3   Title  Patient will improve AROM for bil knees to 0-120 to obtain functional ROM without pain for improved quality and performance of gait and stairs ambulation.    Time  6    Period  Weeks    Status  New      PT LONG TERM GOAL #4   Title  Patient will reports no pain for entire week while participating in HEP and walking program of 30 minutes or more, 3x/week to improve participation in regular exercise with no increased pain.     Time  6    Period  Weeks    Status  New            Plan - 06/16/18 1655    Clinical Impression Statement  Continued session focus with hip strengthening and knee mobility to reduce pain and improve function.  Added knee drives and EOS with bike for mobility (not included wiht charges).  Pt reports reduction in knee pain following joint mobs though did report increased pain by 1 grade following knee drives.  Progressed to [facility] for hip strengthening and stability with SLS, added SLS to HEP.      Rehab Potential  Good    PT Frequency  2x / week    PT Duration  6 weeks    PT Treatment/Interventions  ADLs/Self Care Home Management;Cryotherapy;Electrical Stimulation;Moist Heat;Gait  training;Stair training;Functional mobility training;Therapeutic activities;Therapeutic exercise;Balance training;Neuromuscular re-education;Patient/family education;Manual techniques;Passive range of motion;Taping    PT Next Visit Plan  AROM measurement next session.  Continue joint mobilization for pain, grade I/II to bil knee joints. Perform exercises for hip and LE strengthing in supine and progress to standing.    PT Home Exercise Plan  Eval: bridge, SLR; 10/23: SLS       Patient will benefit from skilled therapeutic intervention in order to improve the following deficits and impairments:  Abnormal gait, Pain, Decreased mobility, Postural dysfunction, Decreased activity tolerance, Decreased endurance, Decreased range of motion, Decreased strength, Hypomobility, Impaired flexibility, Increased edema, Difficulty walking, Decreased balance  Visit Diagnosis: Chronic pain of right knee  Stiffness of right knee, not elsewhere classified  Other abnormalities of gait and mobility  Muscle weakness (generalized)     Problem List Patient Active Problem List  Diagnosis Date Noted  . Fall at home, subsequent encounter 06/07/2018  . Knee pain, right 05/31/2017  . CKD (chronic kidney disease) stage 3, GFR 30-59 ml/min (HCC) 10/01/2016  . 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 Urinary incontinence 05/22/2014  . Metabolic syndrome X 40/98/2867  . Osteopenia 12/12/2013  . Anemia 11/29/2013  . Arthritis of knee, right 02/21/2013  . Obesity (BMI 30-39.9) 10/17/2012  . Thoracic aortic aneurysm ([place]) 10/12/2012  . Allergic rhinitis 03/02/2012  . BLADDER PROLAPSE 09/24/2010  . NEOPLASM, MALIGNANT, BREAST, RIGHT 02/04/2008  . Hyperlipidemia LDL goal <100 02/04/2008  . Essential hypertension 02/04/2008   Ihor Austin, [place]; [place]  Aldona Lento 06/16/2018, [facility] [address] [place],  [place], [address] Phone: [phone]   Fax:  [phone]  Name: Desiree [name] MRN: [id number] Date of Birth: 1946/02/03

## 2018-06-21 ENCOUNTER — Ambulatory Visit (HOSPITAL_COMMUNITY): Payer: PPO

## 2018-06-21 ENCOUNTER — Other Ambulatory Visit: Payer: Self-pay

## 2018-06-21 ENCOUNTER — Encounter (HOSPITAL_COMMUNITY): Payer: Self-pay

## 2018-06-21 DIAGNOSIS — M25561 Pain in right knee: Principal | ICD-10-CM

## 2018-06-21 DIAGNOSIS — R2689 Other abnormalities of gait and mobility: Secondary | ICD-10-CM

## 2018-06-21 DIAGNOSIS — M6281 Muscle weakness (generalized): Secondary | ICD-10-CM

## 2018-06-21 DIAGNOSIS — G8929 Other chronic pain: Secondary | ICD-10-CM

## 2018-06-21 DIAGNOSIS — M25661 Stiffness of right knee, not elsewhere classified: Secondary | ICD-10-CM

## 2018-06-21 NOTE — Therapy (Signed)
[place], [place], [address] Phone: [phone]   Fax:  [phone]  Physical Therapy Aquatic Treatment  Patient Details  Name: Desiree [name] MRN: [id number] Date of Birth: [birth year] Referring Provider (PT): [name], MD   Encounter Date: 06/21/2018  PT End of Session - 06/21/18 1729    Visit Number  4    Number of Visits  13    Date for PT Re-Evaluation  07/22/18   Minireassess 07/01/2018   Authorization Type  [employer] (no visit limit, no auth required)    Authorization Time Period  06/10/18 - 07/23/18    Authorization - Visit Number  4    Authorization - Number of Visits  13    PT Start Time  7782    PT Stop Time  1512    PT Time Calculation (min)  40 min    Activity Tolerance  Patient tolerated treatment well;No increased pain    Behavior During Therapy  WFL for tasks assessed/performed       Past Medical History:  Diagnosis Date  . Allergic rhinitis   . Arthritis HIPS/ WRISTS  . DDD (degenerative disc disease), cervical   . GERD (gastroesophageal reflux disease) OCCASIONAL  . Heart murmur ASYMPTOMATIC  . History of Bell's palsy 2011  RIGHT SIDE -- RESOLVED  . 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 History of CVA (cerebrovascular accident) PER SCAN IN 2011  . Hyperlipidemia   . Hypertension   . Impaired fasting glucose PER PCP  DR SIMPSON   WATCH DIET  . Mixed urge and stress incontinence   . Nocturia   . Sinus drainage   . Thoracic ascending aortic aneurysm ([place]) LAST CHEST CT 09-02-2010--  FOLLOWED BY  CARDIOLOGIST--  DR [id number]  . Vaginal wall prolapse     Past Surgical History:  Procedure Laterality Date  . BILATERAL CARPAL TUNNEL RELEASE  1980'S  . CARDIAC CATHETERIZATION  02-01-2009  DR EICHHORN   NORMAL CORONARY ARTERIES/ NORMAL LV SIZE AND FUNCTION/ AORTIC ROOT SIZE AT THE UPPER LIMIT  OF NORMAL   . CARPAL TUNNEL RELEASE    . CERVICAL DISC SURGERY  12-12-2005  DR Vertell Limber   LEFT  C6 - C7 HERINATED / DDD/ SPONDYLOSIS  . COLONOSCOPY N/A 02/10/2014   Procedure: COLONOSCOPY;  Surgeon: Danie Binder, MD;  [facility];  Service: Endoscopy;  Laterality: N/A;  8:30  . CYSTOCELE REPAIR  08/02/2012   Procedure: ANTERIOR REPAIR (CYSTOCELE);  Surgeon: Ailene Rud, MD;  Location[facility];  Service: Urology;  Laterality: N/A;  [employer] Uphold Anterior Pelvic Floor Sacrospinus Repair. Anterior wall of the vagina.  . ESOPHAGOGASTRODUODENOSCOPY N/A 02/10/2014   Procedure: ESOPHAGOGASTRODUODENOSCOPY (EGD);  Surgeon: Danie Binder, MD;  [facility];  Service: Endoscopy;  Laterality: N/A;  . GIVENS CAPSULE STUDY N/A 03/13/2014   Procedure: GIVENS CAPSULE STUDY;  Surgeon: Danie Binder, MD;  [facility];  Service: Endoscopy;  Laterality: N/A;  7:30  . NECK SURGERY  2007  . RIGHT PARTIAL MASTECTOMY  11-11-2004  DR [name] YOUNG   DUCTAL CARCINOMA IN SITU RIGHT BREAST  . TOTAL ABDOMINAL HYSTERECTOMY W/ BILATERAL SALPINGOOPHORECTOMY  1990  . TRANSTHORACIC ECHOCARDIOGRAM  03-30-2008  DR MARGARET SIMPSON   LV SIZE AND FUNCTION NORMAL/ MODERATE AORTIC ARCH DILATATION/ MILD MR    There were no  vitals filed for this visit.  Subjective Assessment - 06/21/18 1657    Subjective  Patient reports she is nervous for pool therapy as she is not the most comfortable in water. She would like to participate though and states she has no pain today.    Pertinent History  fall on 06/04/18    Limitations  Standing;Sitting;Walking;House hold activities    How long can you sit comfortably?  prolonged periods is uncomfortable    How long can you stand comfortably?  prolonged standign incresases pain    How long can you walk comfortably?  no difficulty    Patient Stated Goals  to improve walking and reduce pain    Currently in Pain?  No/denies         Adult Aquatic Therapy - 06/21/18 1658      Aquatic Therapy Subjective   Subjective  Patient reports she is nervous for pool therapy as she is not the most comfortable in water. She would like to participate though and states she has no pain today.      Treatment   Gait  Side stepping along wall, 3x RT, 20'    Exercises  1x 20 reps bil LE heel raise, 3 sec holds, bil UE support. 1x 15 reps bil LE hip abduction. 1x 15 reps bil LE hip extension. 3x 30 sec hamstring stretch on second step, bil LE. 1x 15 reps squat with heel raise combined with bil UE support. Bil LE hip flexion, 1x 15 reps.     Balance  5x SLS bil LE for 10 sec holds, with intermittent UE support. 6 reps tandem stance, alt foot alignment, 10 sec holds with hand held support from therapist.         PT Education - 06/21/18 1728    Education Details  Educated on exercise in aquatic setting and on safety. Educated on benefits on pool therapy for arthritis based on current evidence.    Person(s) Educated  Patient    Methods  Explanation;Demonstration    Comprehension  Verbalized understanding;Returned demonstration       PT Short Term Goals - 06/10/18 1602      PT SHORT TERM GOAL #1   Title  Patient will be independe with HEP, updated PRN, to improve funcitonal ROM and strength for bil LE's to improve activity tolerance.     Time  2    Period  Weeks    Status  New    Target Date  06/24/18      PT SHORT TERM GOAL #2   Title  Patient will improve MMT by 1/2 grade for limited groups to demonstrate significnat improvement in LE function and improve endurance with walking and and daily activities.     Time  3    Period  Weeks    Status  New    Target Date  07/01/18      PT SHORT TERM GOAL #3   Title  Patient will perform SLS for 15 seconds on bil LE to indicate improved balance and reduced fall risk with gait and stair mobility.    Time  3    Period  Weeks    Status  New        PT Long Term Goals - 06/10/18  1607      PT LONG TERM GOAL #1   Title  Patient will improve MMT by 1 grade for limited groups to demonstrate significnat improvement in LE function and improve endurance with walking and  and daily activities.     Time  6    Period  Weeks    Status  New    Target Date  07/22/18      PT LONG TERM GOAL #2   Title  Patient will ambulate at = or > 1.0 m/s with LRAD during 2MWT to demonstrate safe community walking speed, reduced fall risk, and improved community access.    Time  6    Period  Weeks    Status  New      PT LONG TERM GOAL #3   Title  Patient will improve AROM for bil knees to 0-120 to obtain functional ROM without pain for improved quality and performance of gait and stairs ambulation.    Time  6    Period  Weeks    Status  New      PT LONG TERM GOAL #4   Title  Patient will reports no pain for entire week while participating in HEP and walking program of 30 minutes or more, 3x/week to improve participation in regular exercise with no increased pain.     Time  6    Period  Weeks    Status  New        Plan - 06/21/18 1730    Clinical Impression Statement  Initiated aquatic therapy this session; patient wore floatation belt for comfort due to mild apprehension to water. She completed exercises for LE strengthening and stretching with focus on hip strengthening. Patient also performed balance training in aquatic setting and gradually reduced UE support throughout as she gained confidence. She required verbal cues for form with exercises and had improved performance with repetition. She denied pain throughout session. Patient will continue to benefit from skilled PT interventions to address impairments and improve LE strength and knee mobility to improve QOL and function in daily activities.    Rehab Potential  Good    PT Frequency  2x / week    PT Duration  6 weeks    PT Treatment/Interventions  ADLs/Self Care Home Management;Cryotherapy;Electrical Stimulation;Moist Heat;Gait  training;Stair training;Functional mobility training;Therapeutic activities;Therapeutic exercise;Balance training;Neuromuscular re-education;Patient/family education;Manual techniques;Passive range of motion;Taping    PT Next Visit Plan  Measure AROM next session. Continue joint mobilization for pain, grade I/II to bil knee joints. Focus on hip and LE strengthening in supine and progress to standing. Initiate balance training.    PT Home Exercise Plan  Eval: bridge, SLR; 10/23: SLS    Consulted and Agree with Plan of Care  Patient       Patient will benefit from skilled therapeutic intervention in order to improve the following deficits and impairments:  Abnormal gait, Pain, Decreased mobility, Postural dysfunction, Decreased activity tolerance, Decreased endurance, Decreased range of motion, Decreased strength, Hypomobility, Impaired flexibility, Increased edema, Difficulty walking, Decreased balance  Visit Diagnosis: Chronic pain of right knee  Stiffness of right knee, not elsewhere classified  Other abnormalities of gait and mobility  Muscle weakness (generalized)     Problem List Patient Active Problem List   Diagnosis Date Noted  . Fall at home, subsequent encounter 06/07/2018  . Knee pain, right 05/31/2017  . CKD (chronic kidney disease) stage 3, GFR 30-59 ml/min (HCC) 10/01/2016  . 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 Urinary incontinence 05/22/2014  . Metabolic syndrome X 93/81/8299  . Osteopenia 12/12/2013  . Anemia 11/29/2013  . Arthritis of knee, right 02/21/2013  . Obesity (BMI 30-39.9) 10/17/2012  . Thoracic aortic aneurysm ([employer]) 10/12/2012  .  Allergic rhinitis 03/02/2012  . BLADDER PROLAPSE 09/24/2010  . NEOPLASM, MALIGNANT, BREAST, RIGHT 02/04/2008  . Hyperlipidemia LDL goal <100 02/04/2008  . Essential hypertension 02/04/2008    Kipp Brood, PT, DPT Physical Therapist with [facility]  06/21/2018 5:42  PM    [place], [place], [address] Phone: [phone]   Fax:  [phone]  Name: RASHAN PATIENT MRN: [id number] Date of Birth: 07-18-1946

## 2018-06-22 ENCOUNTER — Encounter (HOSPITAL_COMMUNITY): Payer: Self-pay

## 2018-06-24 ENCOUNTER — Telehealth (HOSPITAL_COMMUNITY): Payer: Self-pay

## 2018-06-24 ENCOUNTER — Ambulatory Visit (HOSPITAL_COMMUNITY): Payer: PPO

## 2018-06-24 ENCOUNTER — Encounter (HOSPITAL_COMMUNITY): Payer: Self-pay

## 2018-06-24 DIAGNOSIS — M25561 Pain in right knee: Principal | ICD-10-CM

## 2018-06-24 DIAGNOSIS — R2689 Other abnormalities of gait and mobility: Secondary | ICD-10-CM

## 2018-06-24 DIAGNOSIS — G8929 Other chronic pain: Secondary | ICD-10-CM

## 2018-06-24 DIAGNOSIS — M25661 Stiffness of right knee, not elsewhere classified: Secondary | ICD-10-CM

## 2018-06-24 DIAGNOSIS — M6281 Muscle weakness (generalized): Secondary | ICD-10-CM

## 2018-06-24 NOTE — Therapy (Signed)
[place], [place], [address] Phone: [phone]   Fax:  [phone]  Physical Therapy Treatment  Patient Details  Name: Desiree [name] MRN: [id number] Date of Birth: [birth year] Referring Provider (PT): [name], MD   Encounter Date: 06/24/2018  PT End of Session - 06/24/18 1346    Visit Number  5    Number of Visits  13    Date for PT Re-Evaluation  07/22/18   Minireassess 07/01/2018   Authorization Type  [employer] (no visit limit, no auth required)    Authorization Time Period  06/10/18 - 07/23/18    Authorization - Visit Number  5    Authorization - Number of Visits  13    PT Start Time  2536    PT Stop Time  6440    PT Time Calculation (min)  43 min    Activity Tolerance  Patient tolerated treatment well;No increased pain    Behavior During Therapy  WFL for tasks assessed/performed       Past Medical History:  Diagnosis Date  . Allergic rhinitis   . Arthritis HIPS/ WRISTS  . DDD (degenerative disc disease), cervical   . GERD (gastroesophageal reflux disease) OCCASIONAL  . Heart murmur ASYMPTOMATIC  . History of Bell's palsy 2011  RIGHT SIDE -- RESOLVED  . 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 History of CVA (cerebrovascular accident) PER SCAN IN 2011  . Hyperlipidemia   . Hypertension   . Impaired fasting glucose PER PCP  DR SIMPSON   WATCH DIET  . Mixed urge and stress incontinence   . Nocturia   . Sinus drainage   . Thoracic ascending aortic aneurysm ([place]) LAST CHEST CT 09-02-2010--  FOLLOWED BY  CARDIOLOGIST--  DR [id number]  . Vaginal wall prolapse     Past Surgical History:  Procedure Laterality Date  . BILATERAL CARPAL TUNNEL RELEASE  1980'S  . CARDIAC CATHETERIZATION  02-01-2009  DR EICHHORN   NORMAL CORONARY ARTERIES/ NORMAL LV SIZE AND FUNCTION/ AORTIC ROOT SIZE AT THE UPPER LIMIT OF NORMAL    . CARPAL TUNNEL RELEASE    . CERVICAL DISC SURGERY  12-12-2005  DR Vertell Limber   LEFT  C6 - C7 HERINATED / DDD/ SPONDYLOSIS  . COLONOSCOPY N/A 02/10/2014   Procedure: COLONOSCOPY;  Surgeon: Danie Binder, MD;  [facility];  Service: Endoscopy;  Laterality: N/A;  8:30  . CYSTOCELE REPAIR  08/02/2012   Procedure: ANTERIOR REPAIR (CYSTOCELE);  Surgeon: Ailene Rud, MD;  Location: [facility];  Service: Urology;  Laterality: N/A;  [employer] Uphold Anterior Pelvic Floor Sacrospinus Repair. Anterior wall of the vagina.  . ESOPHAGOGASTRODUODENOSCOPY N/A 02/10/2014   Procedure: ESOPHAGOGASTRODUODENOSCOPY (EGD);  Surgeon: Danie Binder, MD;  [facility];  Service: Endoscopy;  Laterality: N/A;  . GIVENS CAPSULE STUDY N/A 03/13/2014   Procedure: GIVENS CAPSULE STUDY;  Surgeon: Danie Binder, MD;  [facility];  Service: Endoscopy;  Laterality: N/A;  7:30  . NECK SURGERY  2007  . RIGHT PARTIAL MASTECTOMY  11-11-2004  DR [name] YOUNG   DUCTAL CARCINOMA IN SITU RIGHT BREAST  . TOTAL ABDOMINAL HYSTERECTOMY W/ BILATERAL SALPINGOOPHORECTOMY  1990  . TRANSTHORACIC ECHOCARDIOGRAM  03-30-2008  DR MARGARET SIMPSON   LV SIZE AND FUNCTION NORMAL/ MODERATE AORTIC ARCH DILATATION/ MILD MR    There were no vitals  filed for this visit.  Subjective Assessment - 06/24/18 1350    Subjective  Pt states that her knee is a little more achy today which she attributes to the rain. She states she was sore the next day following aquatic therapy but not bad and she enjoyed it overall.     Pertinent History  fall on 06/04/18    Limitations  Standing;Sitting;Walking;House hold activities    How long can you sit comfortably?  prolonged periods is uncomfortable    How long can you stand comfortably?  prolonged standign incresases pain    How long can you walk comfortably?  no difficulty    Patient Stated Goals  to improve walking and reduce pain    Currently in Pain?   Yes    Pain Score  4     Pain Location  Knee    Pain Orientation  Right    Pain Descriptors / Indicators  Aching    Pain Type  Acute pain    Pain Onset  1 to 4 weeks ago    Pain Frequency  Intermittent    Aggravating Factors   standing or sitting prolonged    Pain Relieving Factors  800mg  ibuprofen    Effect of Pain on Daily Activities  increases            [facility] Adult PT Treatment/Exercise - 06/24/18 0001      Exercises   Exercises  Knee/Hip      Knee/Hip Exercises: Stretches   Active Hamstring Stretch  Right;3 reps;30 seconds    Active Hamstring Stretch Limitations  standing 8" step    Knee: Self-Stretch to increase Flexion  Both;5 reps;10 seconds    Knee: Self-Stretch Limitations  knee drive 8" step    Gastroc Stretch  Both;3 reps;30 seconds    Gastroc Stretch Limitations  slant board      Knee/Hip Exercises: Standing   Hip Abduction  Both;2 sets;10 reps    Abduction Limitations  RTB 3" holds    Hip Extension  Both;2 sets;10 reps    Extension Limitations  RTB 3" holds    Lateral Step Up  Right;10 reps;Step Height: 4";Hand Hold: 2    Forward Step Up  Right;10 reps;Step Height: 4";Hand Hold: 2    SLS with Vectors  BLE x5RT foam, intermittent UE support    Other Standing Knee Exercises  tandem stance foam 3x10" holds each foot forward    Other Standing Knee Exercises  sidestepping RTB 46ftx2RT      Knee/Hip Exercises: Supine   Straight Leg Raises  Both;2 sets;10 reps    Straight Leg Raises Limitations  quad set before    Knee Extension Limitations  R:0 L: 0    Knee Flexion Limitations  R: 106deg L: 113deg           PT Education - 06/24/18 1346    Education Details  exercise technique, continue HEP    Person(s) Educated  Patient    Methods  Explanation;Demonstration    Comprehension  Verbalized understanding;Returned demonstration       PT Short Term Goals - 06/10/18 1602      PT SHORT TERM GOAL #1   Title  Patient will be independe with HEP, updated  PRN, to improve funcitonal ROM and strength for bil LE's to improve activity tolerance.     Time  2    Period  Weeks    Status  New    Target Date  06/24/18  PT SHORT TERM GOAL #2   Title  Patient will improve MMT by 1/2 grade for limited groups to demonstrate significnat improvement in LE function and improve endurance with walking and and daily activities.     Time  3    Period  Weeks    Status  New    Target Date  07/01/18      PT SHORT TERM GOAL #3   Title  Patient will perform SLS for 15 seconds on bil LE to indicate improved balance and reduced fall risk with gait and stair mobility.    Time  3    Period  Weeks    Status  New        PT Long Term Goals - 06/10/18 1607      PT LONG TERM GOAL #1   Title  Patient will improve MMT by 1 grade for limited groups to demonstrate significnat improvement in LE function and improve endurance with walking and and daily activities.     Time  6    Period  Weeks    Status  New    Target Date  07/22/18      PT LONG TERM GOAL #2   Title  Patient will ambulate at = or > 1.0 m/s with LRAD during 2MWT to demonstrate safe community walking speed, reduced fall risk, and improved community access.    Time  6    Period  Weeks    Status  New      PT LONG TERM GOAL #3   Title  Patient will improve AROM for bil knees to 0-120 to obtain functional ROM without pain for improved quality and performance of gait and stairs ambulation.    Time  6    Period  Weeks    Status  New      PT LONG TERM GOAL #4   Title  Patient will reports no pain for entire week while participating in HEP and walking program of 30 minutes or more, 3x/week to improve participation in regular exercise with no increased pain.     Time  6    Period  Weeks    Status  New            Plan - 06/24/18 1347    Clinical Impression Statement  Continued with established POC focusing on CKC strengthening and R knee ROM. Progressed her to sidestepping with RTB and  fwd/lat step ups on 4" step. Min to no reports of pain during therex, mainly minor discomfort during fwd step ups and she required min cues required for form. L knee AROM 0-113deg and R knee AROM 0-106deg this date. Continue as planned, progressing as able.     Rehab Potential  Good    PT Frequency  2x / week    PT Duration  6 weeks    PT Treatment/Interventions  ADLs/Self Care Home Management;Cryotherapy;Electrical Stimulation;Moist Heat;Gait training;Stair training;Functional mobility training;Therapeutic activities;Therapeutic exercise;Balance training;Neuromuscular re-education;Patient/family education;Manual techniques;Passive range of motion;Taping    PT Next Visit Plan  Continue joint mobilization for pain, grade I/II to bil knee joints. Focus on hip and LE strengthening in supine and progress to standing. contiue balance training.    PT Home Exercise Plan  Eval: bridge, SLR; 10/23: SLS    Consulted and Agree with Plan of Care  Patient       Patient will benefit from skilled therapeutic intervention in order to improve the following deficits and impairments:  Abnormal gait, Pain, Decreased mobility,  Postural dysfunction, Decreased activity tolerance, Decreased endurance, Decreased range of motion, Decreased strength, Hypomobility, Impaired flexibility, Increased edema, Difficulty walking, Decreased balance  Visit Diagnosis: Chronic pain of right knee  Stiffness of right knee, not elsewhere classified  Other abnormalities of gait and mobility  Muscle weakness (generalized)     Problem List Patient Active Problem List   Diagnosis Date Noted  . Fall at home, subsequent encounter 06/07/2018  . Knee pain, right 05/31/2017  . CKD (chronic kidney disease) stage 3, GFR 30-59 ml/min (HCC) 10/01/2016  . 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 Urinary incontinence 05/22/2014  . Metabolic syndrome X 15/12/6977  . Osteopenia 12/12/2013  . Anemia 11/29/2013  . Arthritis  of knee, right 02/21/2013  . Obesity (BMI 30-39.9) 10/17/2012  . Thoracic aortic aneurysm ([place]) 10/12/2012  . Allergic rhinitis 03/02/2012  . BLADDER PROLAPSE 09/24/2010  . NEOPLASM, MALIGNANT, BREAST, RIGHT 02/04/2008  . Hyperlipidemia LDL goal <100 02/04/2008  . Essential hypertension 02/04/2008        Geraldine Solar PT, DPT  [place] [address] [place], [place], [address] Phone: [phone]   Fax:  [phone]  Name: RYANE KONIECZNY MRN: [id number] Date of Birth: [birth year]
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
## 2018-06-28 ENCOUNTER — Other Ambulatory Visit: Payer: Self-pay

## 2018-06-28 ENCOUNTER — Ambulatory Visit (HOSPITAL_COMMUNITY): Payer: PPO | Attending: Family Medicine

## 2018-06-28 ENCOUNTER — Encounter (HOSPITAL_COMMUNITY): Payer: Self-pay

## 2018-06-28 DIAGNOSIS — M6281 Muscle weakness (generalized): Secondary | ICD-10-CM | POA: Diagnosis not present

## 2018-06-28 DIAGNOSIS — M25561 Pain in right knee: Secondary | ICD-10-CM | POA: Insufficient documentation

## 2018-06-28 DIAGNOSIS — G8929 Other chronic pain: Secondary | ICD-10-CM | POA: Diagnosis not present

## 2018-06-28 DIAGNOSIS — M25661 Stiffness of right knee, not elsewhere classified: Secondary | ICD-10-CM | POA: Diagnosis not present

## 2018-06-28 DIAGNOSIS — R2689 Other abnormalities of gait and mobility: Secondary | ICD-10-CM | POA: Diagnosis not present

## 2018-06-28 NOTE — Therapy (Signed)
[place], [place], [address] Phone: [phone]   Fax:  [phone]  Physical Therapy Aquatic Treatment  Patient Details  Name: Desiree [name] MRN: [id number] Date of Birth: 08-Feb-1946 Referring Provider (PT): [name], MD   Encounter Date: 06/28/2018  PT End of Session - 06/28/18 1649    Visit Number  6    Number of Visits  13    Date for PT Re-Evaluation  07/22/18   Minireassess 07/01/2018   Authorization Type  [employer] (no visit limit, no auth required)    Authorization Time Period  06/10/18 - 07/23/18    Authorization - Visit Number  6    Authorization - Number of Visits  13    PT Start Time  7672    PT Stop Time  0947    PT Time Calculation (min)  39 min    Activity Tolerance  Patient tolerated treatment well;No increased pain    Behavior During Therapy  WFL for tasks assessed/performed       Past Medical History:  Diagnosis Date  . Allergic rhinitis   . Arthritis HIPS/ WRISTS  . DDD (degenerative disc disease), cervical   . GERD (gastroesophageal reflux disease) OCCASIONAL  . Heart murmur ASYMPTOMATIC  . History of Bell's palsy 2011  RIGHT SIDE -- RESOLVED  . 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 History of CVA (cerebrovascular accident) PER SCAN IN 2011  . Hyperlipidemia   . Hypertension   . Impaired fasting glucose PER PCP  DR SIMPSON   WATCH DIET  . Mixed urge and stress incontinence   . Nocturia   . Sinus drainage   . Thoracic ascending aortic aneurysm ([place]) LAST CHEST CT 09-02-2010--  FOLLOWED BY  CARDIOLOGIST--  DR [id number]  . Vaginal wall prolapse     Past Surgical History:  Procedure Laterality Date  . BILATERAL CARPAL TUNNEL RELEASE  1980'S  . CARDIAC CATHETERIZATION  02-01-2009  DR EICHHORN   NORMAL CORONARY ARTERIES/ NORMAL LV SIZE AND FUNCTION/ AORTIC ROOT SIZE AT THE UPPER LIMIT OF  NORMAL   . CARPAL TUNNEL RELEASE    . CERVICAL DISC SURGERY  12-12-2005  DR Vertell Limber   LEFT  C6 - C7 HERINATED / DDD/ SPONDYLOSIS  . COLONOSCOPY N/A 02/10/2014   Procedure: COLONOSCOPY;  Surgeon: Danie Binder, MD;  [facility];  Service: Endoscopy;  Laterality: N/A;  8:30  . CYSTOCELE REPAIR  08/02/2012   Procedure: ANTERIOR REPAIR (CYSTOCELE);  Surgeon: Ailene Rud, MD;  Location: [facility];  Service: Urology;  Laterality: N/A;  [employer] Uphold Anterior Pelvic Floor Sacrospinus Repair. Anterior wall of the vagina.  . ESOPHAGOGASTRODUODENOSCOPY N/A 02/10/2014   Procedure: ESOPHAGOGASTRODUODENOSCOPY (EGD);  Surgeon: Danie Binder, MD;  [facility];  Service: Endoscopy;  Laterality: N/A;  . GIVENS CAPSULE STUDY N/A 03/13/2014   Procedure: GIVENS CAPSULE STUDY;  Surgeon: Danie Binder, MD;  [facility];  Service: Endoscopy;  Laterality: N/A;  7:30  . NECK SURGERY  2007  . RIGHT PARTIAL MASTECTOMY  11-11-2004  DR [name] YOUNG   DUCTAL CARCINOMA IN SITU RIGHT BREAST  . TOTAL ABDOMINAL HYSTERECTOMY W/ BILATERAL SALPINGOOPHORECTOMY  1990  . TRANSTHORACIC ECHOCARDIOGRAM  03-30-2008  DR MARGARET SIMPSON   LV SIZE AND FUNCTION NORMAL/ MODERATE AORTIC ARCH DILATATION/ MILD MR    There were no  vitals filed for this visit.  Subjective Assessment - 06/28/18 1641    Subjective  Patient reports she had quite a bit of pain this weekend. She reports she went out on Saturday with friends and drove to/from [place], walked a lot, and spent extended periods sitting. She reports her knee is hurting today at about 4/10.    Pertinent History  fall on 06/04/18    Limitations  Standing;Sitting;Walking;House hold activities    How long can you sit comfortably?  prolonged periods is uncomfortable    How long can you stand comfortably?  prolonged standign incresases pain    How long can you walk comfortably?  no difficulty    Patient Stated Goals   to improve walking and reduce pain    Currently in Pain?  Yes    Pain Score  4     Pain Location  Knee    Pain Orientation  Right    Pain Descriptors / Indicators  Aching    Pain Type  Acute pain    Pain Onset  1 to 4 weeks ago    Pain Frequency  Intermittent    Aggravating Factors   prolonged walking, standing, sitting    Pain Relieving Factors  800 mg ibuprofen        Adult Aquatic Therapy - 06/28/18 1643      Aquatic Therapy Subjective   Subjective  Patient reports she had quite a bit of pain this weekend. She reports she went out on Saturday with friends and drove to/from [place], walked a lot, and spent extended periods sitting. She reports her knee is hurting today at about 4/10.      Treatment   Exercises  1x 20 reps bil LE heel raise, 3 sec holds, bil UE support. 1x 20 reps bil LE hip abduction. 1x 15 reps bil LE hip extension. 1x 15 reps, bil LE, front leg pull on second step. 2x 30 sec runner stretch for gastrocnemius bil LE. 1x 15 reps squat with bil UE support. 1x 15 reps bil LE, LAQ with single UE support. 1x 15 reps noddle press with single UE support. 1x 15 reps bil LE standing knee flexion.       Balance  --         PT Education - 06/28/18 1648    Education Details  Educated on exercises throughout and on proper form with exercises. Educated on delayed onset muscle soreness and appropriate.expected timeline for decrease in soreness.    Person(s) Educated  Patient    Methods  Explanation;Demonstration;Verbal cues    Comprehension  Verbalized understanding;Returned demonstration       PT Short Term Goals - 06/10/18 1602      PT SHORT TERM GOAL #1   Title  Patient will be independe with HEP, updated PRN, to improve funcitonal ROM and strength for bil LE's to improve activity tolerance.     Time  2    Period  Weeks    Status  New    Target Date  06/24/18      PT SHORT TERM GOAL #2   Title  Patient will improve MMT by 1/2 grade for limited groups to  demonstrate significnat improvement in LE function and improve endurance with walking and and daily activities.     Time  3    Period  Weeks    Status  New    Target Date  07/01/18      PT SHORT TERM GOAL #3  Title  Patient will perform SLS for 15 seconds on bil LE to indicate improved balance and reduced fall risk with gait and stair mobility.    Time  3    Period  Weeks    Status  New        PT Long Term Goals - 06/10/18 1607      PT LONG TERM GOAL #1   Title  Patient will improve MMT by 1 grade for limited groups to demonstrate significnat improvement in LE function and improve endurance with walking and and daily activities.     Time  6    Period  Weeks    Status  New    Target Date  07/22/18      PT LONG TERM GOAL #2   Title  Patient will ambulate at = or > 1.0 m/s with LRAD during 2MWT to demonstrate safe community walking speed, reduced fall risk, and improved community access.    Time  6    Period  Weeks    Status  New      PT LONG TERM GOAL #3   Title  Patient will improve AROM for bil knees to 0-120 to obtain functional ROM without pain for improved quality and performance of gait and stairs ambulation.    Time  6    Period  Weeks    Status  New      PT LONG TERM GOAL #4   Title  Patient will reports no pain for entire week while participating in HEP and walking program of 30 minutes or more, 3x/week to improve participation in regular exercise with no increased pain.     Time  6    Period  Weeks    Status  New        Plan - 06/28/18 1650    Clinical Impression Statement  Continued with aquatic therapy this session; patient wore floatation belt for comfort due to mild apprehension to water. Exercises focused on LE strengthening and stretching to address current impairments. Patient progressed with leg front/back pull today for hip strengthening at step and denied shoulder discomfort with exercise. She denied pain throughout session. Patient will continue to  benefit from skilled PT interventions to address impairments and improve LE strength and knee mobility to improve QOL and function in daily activities.    Rehab Potential  Good    PT Frequency  2x / week    PT Duration  6 weeks    PT Treatment/Interventions  ADLs/Self Care Home Management;Cryotherapy;Electrical Stimulation;Moist Heat;Gait training;Stair training;Functional mobility training;Therapeutic activities;Therapeutic exercise;Balance training;Neuromuscular re-education;Patient/family education;Manual techniques;Passive range of motion;Taping    PT Next Visit Plan  Continue joint mobilization for pain, grade I/II to bil knee joints. Focus on hip and LE strengthening in supine and progress to standing. contiue balance training.    PT Home Exercise Plan  Eval: bridge, SLR; 10/23: SLS    Consulted and Agree with Plan of Care  Patient       Patient will benefit from skilled therapeutic intervention in order to improve the following deficits and impairments:  Abnormal gait, Pain, Decreased mobility, Postural dysfunction, Decreased activity tolerance, Decreased endurance, Decreased range of motion, Decreased strength, Hypomobility, Impaired flexibility, Increased edema, Difficulty walking, Decreased balance  Visit Diagnosis: Chronic pain of right knee  Stiffness of right knee, not elsewhere classified  Other abnormalities of gait and mobility  Muscle weakness (generalized)     Problem List Patient Active Problem List   Diagnosis Date Noted  .  Fall at home, subsequent encounter 06/07/2018  . Knee pain, right 05/31/2017  . CKD (chronic kidney disease) stage 3, GFR 30-59 ml/min (HCC) 10/01/2016  . 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 Urinary incontinence 05/22/2014  . Metabolic syndrome X 91/47/8295  . Osteopenia 12/12/2013  . Anemia 11/29/2013  . Arthritis of knee, right 02/21/2013  . Obesity (BMI 30-39.9) 10/17/2012  . Thoracic aortic aneurysm ([place])  10/12/2012  . Allergic rhinitis 03/02/2012  . BLADDER PROLAPSE 09/24/2010  . NEOPLASM, MALIGNANT, BREAST, RIGHT 02/04/2008  . Hyperlipidemia LDL goal <100 02/04/2008  . Essential hypertension 02/04/2008    Kipp Brood, PT, DPT Physical Therapist with [facility]  06/28/2018 4:55 PM    [place], [place], [address] Phone: [phone]   Fax:  [phone]  Name: SHERRYE PUGA MRN: [id number] Date of Birth: 1945/12/26

## 2018-06-29 ENCOUNTER — Encounter (HOSPITAL_COMMUNITY): Payer: Self-pay

## 2018-07-01 ENCOUNTER — Encounter (HOSPITAL_COMMUNITY): Payer: Self-pay

## 2018-07-01 ENCOUNTER — Ambulatory Visit (HOSPITAL_COMMUNITY): Payer: PPO

## 2018-07-01 DIAGNOSIS — R2689 Other abnormalities of gait and mobility: Secondary | ICD-10-CM

## 2018-07-01 DIAGNOSIS — G8929 Other chronic pain: Secondary | ICD-10-CM

## 2018-07-01 DIAGNOSIS — M25561 Pain in right knee: Secondary | ICD-10-CM | POA: Diagnosis not present

## 2018-07-01 DIAGNOSIS — M6281 Muscle weakness (generalized): Secondary | ICD-10-CM

## 2018-07-01 DIAGNOSIS — M25661 Stiffness of right knee, not elsewhere classified: Secondary | ICD-10-CM

## 2018-07-01 NOTE — Therapy (Addendum)
[facility] [address] [place], [place], [address] Phone: [phone]   Fax:  [phone]  Physical Therapy Treatment/Progress Note  Patient Details  Name: Desiree [name] MRN: [id number] Date of Birth: 05-22-1946 Referring Provider (PT): [name], MD   Encounter Date: 07/01/2018   Progress Note Reporting Period 06/10/18 to 07/01/18  See note below for Objective Data and Assessment of Progress/Goals.   I agree with the below objective findings and clinical assessment performed by Desiree [name], LPTA on this visit 07/01/18.  Kipp Brood, PT, DPT Physical Therapist with [facility]  07/15/2018 3:17 PM     PT End of Session - 07/01/18 1523    Visit Number  7    Number of Visits  13    Date for PT Re-Evaluation  07/22/18   MInireassess 07/01/2018   Authorization Type  [employer] (no visit limit, no auth required)    Authorization Time Period  06/10/18 - 07/23/18    Authorization - Visit Number  7    Authorization - Number of Visits  13    PT Start Time  1520   3' on bike while completed FOTO, not included in charges   PT Stop Time  1604    PT Time Calculation (min)  44 min    Activity Tolerance  Patient tolerated treatment well;No increased pain    Behavior During Therapy  WFL for tasks assessed/performed       Past Medical History:  Diagnosis Date  . Allergic rhinitis   . Arthritis HIPS/ WRISTS  . DDD (degenerative disc disease), cervical   . GERD (gastroesophageal reflux disease) OCCASIONAL  . Heart murmur ASYMPTOMATIC  . History of Bell's palsy 2011  RIGHT SIDE -- RESOLVED  . 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 History of CVA (cerebrovascular accident) PER SCAN IN 2011  . Hyperlipidemia   . Hypertension   . Impaired fasting glucose PER PCP  DR SIMPSON   WATCH DIET  . Mixed urge and stress  incontinence   . Nocturia   . Sinus drainage   . Thoracic ascending aortic aneurysm ([place]) LAST CHEST CT 09-02-2010--  FOLLOWED BY  CARDIOLOGIST--  DR [id number]  . Vaginal wall prolapse     Past Surgical History:  Procedure Laterality Date  . BILATERAL CARPAL TUNNEL RELEASE  1980'S  . CARDIAC CATHETERIZATION  02-01-2009  DR EICHHORN   NORMAL CORONARY ARTERIES/ NORMAL LV SIZE AND FUNCTION/ AORTIC ROOT SIZE AT THE UPPER LIMIT OF NORMAL   . CARPAL TUNNEL RELEASE    . CERVICAL DISC SURGERY  12-12-2005  DR Vertell Limber   LEFT  C6 - C7 HERINATED / DDD/ SPONDYLOSIS  . COLONOSCOPY N/A 02/10/2014   Procedure: COLONOSCOPY;  Surgeon: Danie Binder, MD;  [facility];  Service: Endoscopy;  Laterality: N/A;  8:30  . CYSTOCELE REPAIR  08/02/2012   Procedure: ANTERIOR REPAIR (CYSTOCELE);  Surgeon: Ailene Rud, MD;  [facility];  Service: Urology;  Laterality: N/A;  [employer] Uphold Anterior Pelvic Floor Sacrospinus Repair. Anterior wall of the vagina.  . ESOPHAGOGASTRODUODENOSCOPY N/A 02/10/2014   Procedure: ESOPHAGOGASTRODUODENOSCOPY (EGD);  Surgeon: Danie Binder, MD;  [facility];  Service: Endoscopy;  Laterality: N/A;  . GIVENS CAPSULE STUDY N/A 03/13/2014   Procedure: GIVENS CAPSULE STUDY;  Surgeon: Danie Binder, MD;  [facility];  Service: Endoscopy;  Laterality: N/A;  7:30  . NECK SURGERY  2007  . RIGHT PARTIAL MASTECTOMY  11-11-2004  DR [name] YOUNG   DUCTAL CARCINOMA IN SITU RIGHT BREAST  . TOTAL ABDOMINAL HYSTERECTOMY W/ BILATERAL SALPINGOOPHORECTOMY  1990  . TRANSTHORACIC ECHOCARDIOGRAM  03-30-2008  DR MARGARET SIMPSON   LV SIZE AND FUNCTION NORMAL/ MODERATE AORTIC ARCH DILATATION/ MILD MR    There were no vitals filed for this visit.  Subjective Assessment - 07/01/18 1520    Subjective  Pt stated she felt good in the water and little pain with weight bearing on ground.  Current pain scale Rt lateral knee 3-4/10 wiht  weight bearing.      Pertinent History  fall on 06/04/18    Limitations  Standing;Sitting;Walking;House hold activities    How long can you sit comfortably?  prolonged periods is uncomfortable    How long can you stand comfortably?  prolonged standign incresases pain    How long can you walk comfortably?  07/01/18: reports she went for a 30 minute in the park Tuesday and Wednesday.  no difficulty    Patient Stated Goals  to improve walking and reduce pain    Currently in Pain?  Yes    Pain Score  4     Pain Location  Knee    Pain Orientation  Right;Lateral    Pain Descriptors / Indicators  Sore    Pain Type  Acute pain    Pain Onset  1 to 4 weeks ago    Pain Frequency  Intermittent    Aggravating Factors   prolonged walking, standing, sitting    Pain Relieving Factors  800 mg Ibuprofen    Effect of Pain on Daily Activities  increases         [facility] PT Assessment - 07/01/18 0001      Assessment   Medical Diagnosis  Right Knee Pain    Referring Provider (PT)  [name], MD    Onset Date/Surgical Date  06/04/18   several years, last fall increased Rt knee swelling   Next MD Visit  February 2020      Observation/Other Assessments   Focus on Therapeutic Outcomes (FOTO)   53.32% (limited 46%)      Functional Tests   Functional tests  Single leg stance      Single Leg Stance   Comments  Rt 26", Lt 25" max of 2   Rt LE = 5 sec, Lt LE = 4 sec     ROM / Strength   AROM / PROM / Strength  AROM;Strength      AROM   AROM Assessment Site  Knee    Right/Left Knee  Right;Left    Right Knee Extension  0    Right Knee Flexion  111   was 95   Left Knee Extension  0    Left Knee Flexion  116   was 110     Strength   Strength Assessment Site  Hip;Knee;Ankle    Right Hip Flexion  4-/5   was 3+   Right Hip Extension  3+/5   was 3+   Right Hip ABduction  4/5   was 4-/5 c/o cramping    Left Hip Flexion  3+/5   was 3+   Left Hip Extension  4-/5   was 3+   Left Hip  ABduction  4-/5   was 4-/5   Right/Left Knee  Right;Left    Right Knee Flexion  4-/5   was 3+/8   Right Knee Extension  4+/5   was 4+/5   Left Knee Flexion  4-/5   was 4-   Left Knee Extension  4+/5   was 4+   Right Ankle Dorsiflexion  4/5   was 4/5   Left Ankle Dorsiflexion  --   was 4/5     Ambulation/Gait   Ambulation/Gait  Yes    Ambulation/Gait Assistance  7: Independent    Assistive device  None    Gait Pattern  Step-through pattern;Decreased stance time - right;Decreased stride length;Trendelenburg;Trunk flexed    Ambulation Surface  Level;Indoor                   [facility] Adult PT Treatment/Exercise - 07/01/18 0001      Ambulation/Gait   Ambulation Distance (Feet)  548 Feet   was 350   Gait velocity  1.39 m/s   0.88 m/s     Exercises   Exercises  Knee/Hip      Knee/Hip Exercises: Stretches   Knee: Self-Stretch to increase Flexion  Both;5 reps;10 seconds    Knee: Self-Stretch Limitations  knee drive 12" step      Knee/Hip Exercises: Aerobic   Stationary Bike  3' on bike seat 9,not included with charges      Knee/Hip Exercises: Standing   Lateral Step Up  Right;10 reps;Step Height: 4";Hand Hold: 2    Forward Step Up  Right;10 reps;Step Height: 4";Hand Hold: 2    SLS  Rt 26", LT 25" max of 2    SLS with Vectors  BLE x5RT foam, intermittent UE support    Other Standing Knee Exercises  tandem stance foam 3x10" holds each foot forward    Other Standing Knee Exercises  sidestepping RTB [id number]      Knee/Hip Exercises: Seated   Sit to Sand  10 reps;without UE support               PT Short Term Goals - 07/01/18 1524      PT SHORT TERM GOAL #1   Title  Patient will be independe with HEP, updated PRN, to improve funcitonal ROM and strength for bil LE's to improve activity tolerance.     Baseline  07/01/18:  Reports compliance with HEP 2x/ daily    Status  Achieved      PT SHORT TERM GOAL #2   Title  Patient will improve MMT by 1/2 grade  for limited groups to demonstrate significnat improvement in LE function and improve endurance with walking and and daily activities.     Baseline  07/01/18: see MMT    Status  On-going      PT SHORT TERM GOAL #3   Title  Patient will perform SLS for 15 seconds on bil LE to indicate improved balance and reduced fall risk with gait and stair mobility.    Baseline  11/07: Rt 26", Lt 25" max of 2    Status  Achieved        PT Long Term Goals - 07/01/18 1753      PT LONG TERM GOAL #1   Title  Patient will improve MMT by 1 grade for limited groups to demonstrate significnat improvement in LE function and improve endurance with walking and and daily activities.     Status  On-going      PT LONG TERM GOAL #2   Title  Patient will ambulate at = or > 1.0 m/s with LRAD during  2MWT to demonstrate safe community walking speed, reduced fall risk, and improved community access.    Baseline  07/01/2018: 2MWT 1.39 m/s (was .88 m/s)    Status  Achieved      PT LONG TERM GOAL #3   Title  Patient will improve AROM for bil knees to 0-120 to obtain functional ROM without pain for improved quality and performance of gait and stairs ambulation.    Baseline  07/01/18: Rt 111 degrees (was 95 deg); Lt 110 degrees (was 110 deg)    Status  On-going      PT LONG TERM GOAL #4   Title  Patient will reports no pain for entire week while participating in HEP and walking program of 30 minutes or more, 3x/week to improve participation in regular exercise with no increased pain.     Baseline  07/01/18:  Reports she has began walking program for 30 minutes 2x/week    Status  On-going            Plan - 07/01/18 1716    Clinical Impression Statement  Session focus on knee mobility and functional strengthening.  Also complete subjective and objective testing reviewing goals today.  Pt progressing well with therapy with 2/3 STG and 1/5 LTGs met.  Pt reports compliance wiht HEP daily, improved balance and strength is  progressing slowly.  Improved knee mobility Bil knee and increased cadence with gait with normalize gait mechanics.  Pt does continue to have weakness Bil hips and some pain with weight bearing.   FOTO complete with 46% limitation.  Pt will continue to benefit from therapy to address goals unmet.      Rehab Potential  Good    PT Duration  6 weeks    PT Treatment/Interventions  ADLs/Self Care Home Management;Cryotherapy;Electrical Stimulation;Moist Heat;Gait training;Stair training;Functional mobility training;Therapeutic activities;Therapeutic exercise;Balance training;Neuromuscular re-education;Patient/family education;Manual techniques;Passive range of motion;Taping    PT Next Visit Plan  Continue joint mobilization for pain, grade I/II to bil knee joints. Focus on hip and LE strengthening in standing. contiue balance training.    PT Home Exercise Plan  Eval: bridge, SLR; 10/23: SLS       Patient will benefit from skilled therapeutic intervention in order to improve the following deficits and impairments:  Abnormal gait, Pain, Decreased mobility, Postural dysfunction, Decreased activity tolerance, Decreased endurance, Decreased range of motion, Decreased strength, Hypomobility, Impaired flexibility, Increased edema, Difficulty walking, Decreased balance  Visit Diagnosis: Chronic pain of right knee  Stiffness of right knee, not elsewhere classified  Other abnormalities of gait and mobility  Muscle weakness (generalized)     Problem List Patient Active Problem List   Diagnosis Date Noted  . Fall at home, subsequent encounter 06/07/2018  . Knee pain, right 05/31/2017  . CKD (chronic kidney disease) stage 3, GFR 30-59 ml/min (HCC) 10/01/2016  . 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 Urinary incontinence 05/22/2014  . Metabolic syndrome X 40/98/1191  . Osteopenia 12/12/2013  . Anemia 11/29/2013  . Arthritis of knee, right 02/21/2013  . Obesity (BMI 30-39.9)  10/17/2012  . Thoracic aortic aneurysm ([place]) 10/12/2012  . Allergic rhinitis 03/02/2012  . BLADDER PROLAPSE 09/24/2010  . NEOPLASM, MALIGNANT, BREAST, RIGHT 02/04/2008  . Hyperlipidemia LDL goal <100 02/04/2008  . Essential hypertension 02/04/2008   Desiree [name], [place]; [place]  Aldona Lento 07/01/2018, [facility] [address] [place], [place], [address] Phone: [phone]   Fax:  [phone]  Name:  NISREEN GUISE MRN: [id number] Date of Birth: [birth year]

## 2018-07-05 ENCOUNTER — Other Ambulatory Visit: Payer: Self-pay

## 2018-07-05 ENCOUNTER — Telehealth (HOSPITAL_COMMUNITY): Payer: Self-pay

## 2018-07-05 ENCOUNTER — Ambulatory Visit (HOSPITAL_COMMUNITY): Payer: PPO

## 2018-07-05 ENCOUNTER — Encounter (HOSPITAL_COMMUNITY): Payer: Self-pay

## 2018-07-05 ENCOUNTER — Other Ambulatory Visit: Payer: Self-pay | Admitting: *Deleted

## 2018-07-05 DIAGNOSIS — I712 Thoracic aortic aneurysm, without rupture, unspecified: Secondary | ICD-10-CM

## 2018-07-05 DIAGNOSIS — R2689 Other abnormalities of gait and mobility: Secondary | ICD-10-CM

## 2018-07-05 DIAGNOSIS — M25661 Stiffness of right knee, not elsewhere classified: Secondary | ICD-10-CM

## 2018-07-05 DIAGNOSIS — M25561 Pain in right knee: Secondary | ICD-10-CM | POA: Diagnosis not present

## 2018-07-05 DIAGNOSIS — G8929 Other chronic pain: Secondary | ICD-10-CM

## 2018-07-05 DIAGNOSIS — M6281 Muscle weakness (generalized): Secondary | ICD-10-CM

## 2018-07-05 NOTE — Therapy (Signed)
[place], [place], [address] Phone: [phone]   Fax:  [phone]  Physical Therapy Aquatic Treatment  Patient Details  Name: Desiree [name] MRN: [id number] Date of Birth: [birth year] Referring Provider (PT): [name], MD   Encounter Date: 07/05/2018  PT End of Session - 07/05/18 1604    Visit Number  8    Number of Visits  13    Date for PT Re-Evaluation  07/22/18   MInireassess 07/01/2018   Authorization Type  [employer] (no visit limit, no auth required)    Authorization Time Period  06/10/18 - 07/23/18    Authorization - Visit Number  8    Authorization - Number of Visits  13    PT Start Time  4401    PT Stop Time  1516    PT Time Calculation (min)  41 min    Activity Tolerance  Patient tolerated treatment well    Behavior During Therapy  [facility] for tasks assessed/performed       Past Medical History:  Diagnosis Date  . Allergic rhinitis   . Arthritis HIPS/ WRISTS  . DDD (degenerative disc disease), cervical   . GERD (gastroesophageal reflux disease) OCCASIONAL  . Heart murmur ASYMPTOMATIC  . History of Bell's palsy 2011  RIGHT SIDE -- RESOLVED  . 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 History of CVA (cerebrovascular accident) PER SCAN IN 2011  . Hyperlipidemia   . Hypertension   . Impaired fasting glucose PER PCP  DR SIMPSON   WATCH DIET  . Mixed urge and stress incontinence   . Nocturia   . Sinus drainage   . Thoracic ascending aortic aneurysm ([place]) LAST CHEST CT 09-02-2010--  FOLLOWED BY  CARDIOLOGIST--  DR [id number]  . Vaginal wall prolapse     Past Surgical History:  Procedure Laterality Date  . BILATERAL CARPAL TUNNEL RELEASE  1980'S  . CARDIAC CATHETERIZATION  02-01-2009  DR EICHHORN   NORMAL CORONARY ARTERIES/ NORMAL LV SIZE AND FUNCTION/ AORTIC ROOT SIZE AT THE UPPER LIMIT OF NORMAL   .  CARPAL TUNNEL RELEASE    . CERVICAL DISC SURGERY  12-12-2005  DR Vertell Limber   LEFT  C6 - C7 HERINATED / DDD/ SPONDYLOSIS  . COLONOSCOPY N/A 02/10/2014   Procedure: COLONOSCOPY;  Surgeon: Danie Binder, MD;  [facility];  Service: Endoscopy;  Laterality: N/A;  8:30  . CYSTOCELE REPAIR  08/02/2012   Procedure: ANTERIOR REPAIR (CYSTOCELE);  Surgeon: Ailene Rud, MD;  Location: [facility];  Service: Urology;  Laterality: N/A;  [employer] Uphold Anterior Pelvic Floor Sacrospinus Repair. Anterior wall of the vagina.  . ESOPHAGOGASTRODUODENOSCOPY N/A 02/10/2014   Procedure: ESOPHAGOGASTRODUODENOSCOPY (EGD);  Surgeon: Danie Binder, MD;  [facility];  Service: Endoscopy;  Laterality: N/A;  . GIVENS CAPSULE STUDY N/A 03/13/2014   Procedure: GIVENS CAPSULE STUDY;  Surgeon: Danie Binder, MD;  [facility];  Service: Endoscopy;  Laterality: N/A;  7:30  . NECK SURGERY  2007  . RIGHT PARTIAL MASTECTOMY  11-11-2004  DR [name] YOUNG   DUCTAL CARCINOMA IN SITU RIGHT BREAST  . TOTAL ABDOMINAL HYSTERECTOMY W/ BILATERAL SALPINGOOPHORECTOMY  1990  . TRANSTHORACIC ECHOCARDIOGRAM  03-30-2008  DR MARGARET SIMPSON   LV SIZE AND FUNCTION NORMAL/ MODERATE AORTIC ARCH DILATATION/ MILD MR    There were no vitals filed  for this visit.  Subjective Assessment - 07/05/18 1559    Subjective  Patient reports she is doing well and states her pain is not too bad. She reports the pool therapy does make her knee feel better but she is still anxious about getting into the water at times. If possible she would like to move her monday appointments to the office for land based therapy sessions.    Pertinent History  fall on 06/04/18    Limitations  Standing;Sitting;Walking;House hold activities    How long can you sit comfortably?  prolonged periods is uncomfortable    How long can you stand comfortably?  prolonged standign incresases pain    How long can you walk  comfortably?  no difficulty    Patient Stated Goals  to improve walking and reduce pain    Currently in Pain?  Yes    Pain Score  4     Pain Location  Knee    Pain Orientation  Right;Lateral    Pain Descriptors / Indicators  Aching    Pain Type  Chronic pain    Pain Onset  More than a month ago    Pain Frequency  Intermittent    Aggravating Factors   walking, prolonged positions    Pain Relieving Factors  medicine    Effect of Pain on Daily Activities  some difficulty       Adult Aquatic Therapy - 07/05/18 1559      Aquatic Therapy Subjective   Subjective  Patient reports she is doing well and states her pain is not too bad. She reports the pool therapy does make her knee feel better but she is still anxious about getting into the water at times. If possible she would like to move her monday appointments to the office for land based therapy sessions.      Treatment   Exercises  1x 20 reps squat without UE support, with tactile/verbal cues to facilitate increased knee/hip flexion. 1x 20 reps bil LE heel/toe raises, 3 sec holds, bil UE support. 1x 15 reps bil LE, forward lunge (on 1st step) with single UE support. 1x 20 bil LE, front leg kick with 8lb ankle weight. 1x 20 bil LE, back leg kick with 8lb ankle weights.  1x 20 bil LE, hip abduction with 8lb ankle weight. 1x 20 reps bil LE knee flexion. 3x 30 sec bil LE, hamstring stretch on platform. 1x 15 reps bil LE, LAQ. heel raise, 3 sec holds, bil UE support. 1x 20 reps bil LE hip abduction. 1x 15 reps bil LE hip extension. 1x 15 reps, bil LE, front leg pull on second step. 2x 30 sec runner stretch for gastrocnemius bil LE. 1x 15 reps squat with bil UE support. 1x 15 reps bil LE, LAQ with single UE support. 1x 15 reps noddle press with single UE support. 1x 15 reps bil LE standing knee flexion.           PT Education - 07/05/18 1603    Education Details  Edcuated on exercises in pool this session and discussed that aquatic therapy is an  option but tha tshe does not have to continue with pool therpay if she would prefer to transition into land based therapy completely.     Person(s) Educated  Patient    Methods  Explanation    Comprehension  Verbalized understanding;Returned demonstration       PT Short Term Goals - 07/01/18 1524      PT SHORT TERM  GOAL #1   Title  Patient will be independe with HEP, updated PRN, to improve funcitonal ROM and strength for bil LE's to improve activity tolerance.     Baseline  07/01/18:  Reports compliance with HEP 2x/ daily    Status  Achieved      PT SHORT TERM GOAL #2   Title  Patient will improve MMT by 1/2 grade for limited groups to demonstrate significnat improvement in LE function and improve endurance with walking and and daily activities.     Baseline  07/01/18: see MMT    Status  On-going      PT SHORT TERM GOAL #3   Title  Patient will perform SLS for 15 seconds on bil LE to indicate improved balance and reduced fall risk with gait and stair mobility.    Baseline  11/07: Rt 26", Lt 25" max of 2    Status  Achieved        PT Long Term Goals - 07/01/18 1753      PT LONG TERM GOAL #1   Title  Patient will improve MMT by 1 grade for limited groups to demonstrate significnat improvement in LE function and improve endurance with walking and and daily activities.     Status  On-going      PT LONG TERM GOAL #2   Title  Patient will ambulate at = or > 1.0 m/s with LRAD during 2MWT to demonstrate safe community walking speed, reduced fall risk, and improved community access.    Baseline  07/01/2018: 2MWT 1.39 m/s (was .88 m/s)    Status  Achieved      PT LONG TERM GOAL #3   Title  Patient will improve AROM for bil knees to 0-120 to obtain functional ROM without pain for improved quality and performance of gait and stairs ambulation.    Baseline  07/01/18: Rt 111 degrees (was 95 deg); Lt 110 degrees (was 110 deg)    Status  On-going      PT LONG TERM GOAL #4   Title  Patient  will reports no pain for entire week while participating in HEP and walking program of 30 minutes or more, 3x/week to improve participation in regular exercise with no increased pain.     Baseline  07/01/18:  Reports she has began walking program for 30 minutes 2x/week    Status  On-going        Plan - 07/05/18 1605    Clinical Impression Statement  This session continued with aquatic based therapy for LE strengthening and ROM for Rt knee. Patient wore floatation belt for comfort due to mild apprehension to water. Exercises for strengthening were progressed today and patient was able to perform greater repetitions with addition of ankle weights. She reported that the aquatic exercises provide complete relief of her knee pain however she would like to transition into completely land based exercises if possible. I informed her we can certainly do this and I will look for a date/time that we can reschedule her. Ms. Borel will continue to benefit from skilled PT interventions to address impairments and improve LE strength and knee mobility to improve QOL and function in daily activities.    Rehab Potential  Good    PT Duration  6 weeks    PT Treatment/Interventions  ADLs/Self Care Home Management;Cryotherapy;Electrical Stimulation;Moist Heat;Gait training;Stair training;Functional mobility training;Therapeutic activities;Therapeutic exercise;Balance training;Neuromuscular re-education;Patient/family education;Manual techniques;Passive range of motion;Taping    PT Next Visit Plan  Continue joint mobilization for  pain, grade I/II to bil knee joints. Focus on hip and LE strengthening in standing. Progress balance training and update HEP as able.    PT Home Exercise Plan  Eval: bridge, SLR; 10/23: SLS    Consulted and Agree with Plan of Care  Patient       Patient will benefit from skilled therapeutic intervention in order to improve the following deficits and impairments:  Abnormal gait, Pain, Decreased  mobility, Postural dysfunction, Decreased activity tolerance, Decreased endurance, Decreased range of motion, Decreased strength, Hypomobility, Impaired flexibility, Increased edema, Difficulty walking, Decreased balance  Visit Diagnosis: Chronic pain of right knee  Stiffness of right knee, not elsewhere classified  Other abnormalities of gait and mobility  Muscle weakness (generalized)     Problem List Patient Active Problem List   Diagnosis Date Noted  . Fall at home, subsequent encounter 06/07/2018  . Knee pain, right 05/31/2017  . CKD (chronic kidney disease) stage 3, GFR 30-59 ml/min (HCC) 10/01/2016  . 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 Urinary incontinence 05/22/2014  . Metabolic syndrome X 54/27/0623  . Osteopenia 12/12/2013  . Anemia 11/29/2013  . Arthritis of knee, right 02/21/2013  . Obesity (BMI 30-39.9) 10/17/2012  . Thoracic aortic aneurysm ([place]) 10/12/2012  . Allergic rhinitis 03/02/2012  . BLADDER PROLAPSE 09/24/2010  . NEOPLASM, MALIGNANT, BREAST, RIGHT 02/04/2008  . Hyperlipidemia LDL goal <100 02/04/2008  . Essential hypertension 02/04/2008    Kipp Brood, PT, DPT Physical Therapist with [facility]  07/05/2018 4:10 PM    [place], [place], [address] Phone: [phone]   Fax:  [phone]  Name: TANAYSIA BHARDWAJ MRN: [id number] Date of Birth: 05-22-1946

## 2018-07-05 NOTE — Telephone Encounter (Signed)
I called Ms. Shores and offered her 1:45 Pm appointments on 11/19 and 11/26 instead of her Monday appointments on 11/18 and 11/25. She agreed she would like to change her appointments to these times/dates. I told her we would provide a new schedule for her at her appointment this Friday.  Kipp Brood, PT, DPT Physical Therapist with [facility]  07/05/2018 5:39 PM

## 2018-07-06 ENCOUNTER — Encounter (HOSPITAL_COMMUNITY): Payer: Self-pay

## 2018-07-08 ENCOUNTER — Ambulatory Visit (INDEPENDENT_AMBULATORY_CARE_PROVIDER_SITE_OTHER): Payer: PPO | Admitting: Orthopaedic Surgery

## 2018-07-08 ENCOUNTER — Encounter (HOSPITAL_COMMUNITY): Payer: Self-pay

## 2018-07-08 ENCOUNTER — Encounter (INDEPENDENT_AMBULATORY_CARE_PROVIDER_SITE_OTHER): Payer: Self-pay | Admitting: Orthopaedic Surgery

## 2018-07-08 VITALS — BP 130/65 | HR 59 | Ht 61.5 in | Wt 184.0 lb

## 2018-07-08 DIAGNOSIS — M1711 Unilateral primary osteoarthritis, right knee: Secondary | ICD-10-CM | POA: Diagnosis not present

## 2018-07-08 MED ORDER — LIDOCAINE HCL 1 % IJ SOLN
0.5000 mL | INTRAMUSCULAR | Status: AC | PRN
  Administered 2018-07-08: .5 mL

## 2018-07-08 MED ORDER — BUPIVACAINE HCL 0.25 % IJ SOLN
4.0000 mL | INTRAMUSCULAR | Status: AC | PRN
  Administered 2018-07-08: 4 mL via INTRA_ARTICULAR

## 2018-07-08 MED ORDER — METHYLPREDNISOLONE ACETATE 40 MG/ML IJ SUSP
40.0000 mg | INTRAMUSCULAR | Status: AC | PRN
  Administered 2018-07-08: 40 mg via INTRA_ARTICULAR

## 2018-07-08 NOTE — Progress Notes (Signed)
Office Visit Note   Patient: Desiree [name]           Date of Birth: 1946-02-28           MRN: [id number] Visit Date: 07/08/2018              Requested by: [name], [place], [place], [place] [address] PCP: [name], MD   Assessment & Plan: Visit Diagnoses:  1. Arthritis of knee, right     Plan: Injection performed.  We will check her back again in 8 weeks.  Follow-Up Instructions: Return in about 8 weeks (around 09/02/2018).   Orders:  Orders Placed This Encounter  Procedures  . Large Joint Inj   No orders of the defined types were placed in this encounter.     Procedures: Large Joint Inj: R knee on 07/08/2018 3:15 PM Indications: pain and joint swelling Details: 22 G 1.5 in needle, anterolateral approach  Arthrogram: No  Medications: 40 mg methylPREDNISolone acetate 40 MG/ML; 0.5 mL lidocaine 1 %; 4 mL bupivacaine 0.25 % Outcome: tolerated well, no immediate complications Procedure, treatment alternatives, risks and benefits explained, specific risks discussed. Consent was given by the patient. Immediately prior to procedure a time out was called to verify the correct patient, procedure, equipment, support staff and site/side marked as required. Patient was prepped and draped in the usual sterile fashion.       Clinical Data: No additional findings.   Subjective: Chief Complaint  Patient presents with  . Right Knee - Follow-up    DOI 06/04/18    HPI 72 year old female returns with ongoing problems with her right knee.  She is made some progress with physical therapy improvement in ambulation and some decrease pain.  Still has intermittent swelling in her knee and previous x-rays showed mild degenerative spurring negative for acute fracture after her fall.  Patient sees some intermittent ice plus ibuprofen with some relief.  She is requesting repeat injection today right knee.  Review of Systems 14 point update  unchanged from 05/06/2018 office visit.   Objective: Vital Signs: BP 130/65   Pulse (!) 59   Ht 5' 1.5" (1.562 m)   Wt 184 lb (83.5 kg)   BMI 34.20 kg/m   Physical Exam  Constitutional: She is oriented to person, place, and time. She appears well-developed.  HENT:  Head: Normocephalic.  Right Ear: External ear normal.  Left Ear: External ear normal.  Eyes: Pupils are equal, round, and reactive to light.  Neck: No tracheal deviation present. No thyromegaly present.  Cardiovascular: Normal rate.  Pulmonary/Chest: Effort normal.  Abdominal: Soft.  Neurological: She is alert and oriented to person, place, and time.  Skin: Skin is warm and dry.  Psychiatric: She has a normal mood and affect. Her behavior is normal.    Ortho Exam patient is crepitus with knee range of motion mild dose of the right knee less than 10 degrees.  Palpable lateral osteophytes which are tender as well as patellofemoral crepitus with loading.  She does reach full extension flexes past 110 degrees.  Medial collateral ligament testing is normal pes bursa is nontender.  Distal pulse palpable.  Specialty Comments:  No specialty comments available.  Imaging: No results found.   PMFS History: Patient Active Problem List   Diagnosis Date Noted  . Fall at home, subsequent encounter 06/07/2018  . Knee pain, right 05/31/2017  . CKD (chronic kidney disease) stage 3, GFR 30-59 ml/min (HCC)  10/01/2016  . 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 Urinary incontinence 05/22/2014  . Metabolic syndrome X 98/06/9146  . Osteopenia 12/12/2013  . Anemia 11/29/2013  . Arthritis of knee, right 02/21/2013  . Obesity (BMI 30-39.9) 10/17/2012  . Thoracic aortic aneurysm ([place]) 10/12/2012  . Allergic rhinitis 03/02/2012  . BLADDER PROLAPSE 09/24/2010  . NEOPLASM, MALIGNANT, BREAST, RIGHT 02/04/2008  . Hyperlipidemia LDL goal <100 02/04/2008  . Essential hypertension 02/04/2008   Past Medical History:    Diagnosis Date  . Allergic rhinitis   . Arthritis HIPS/ WRISTS  . DDD (degenerative disc disease), cervical   . GERD (gastroesophageal reflux disease) OCCASIONAL  . Heart murmur ASYMPTOMATIC  . History of Bell's palsy 2011  RIGHT SIDE -- RESOLVED  . 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 History of CVA (cerebrovascular accident) PER SCAN IN 2011  . Hyperlipidemia   . Hypertension   . Impaired fasting glucose PER PCP  DR SIMPSON   WATCH DIET  . Mixed urge and stress incontinence   . Nocturia   . Sinus drainage   . Thoracic ascending aortic aneurysm ([place]) LAST CHEST CT 09-02-2010--  FOLLOWED BY  CARDIOLOGIST--  DR [id number]  . Vaginal wall prolapse     Family History  Problem Relation Age of Onset  . Kidney failure Mother   . Hypertension Mother   . Heart attack Mother 24  . Throat cancer Father   . Prostate cancer Father   . Lung cancer Father 19  . Breast cancer Sister   . Hypertension Brother        2 stents  . Colon cancer Neg Hx   . Colon polyps Neg Hx     Past Surgical History:  Procedure Laterality Date  . BILATERAL CARPAL TUNNEL RELEASE  1980'S  . CARDIAC CATHETERIZATION  02-01-2009  DR EICHHORN   NORMAL CORONARY ARTERIES/ NORMAL LV SIZE AND FUNCTION/ AORTIC ROOT SIZE AT THE UPPER LIMIT OF NORMAL   . CARPAL TUNNEL RELEASE    . CERVICAL DISC SURGERY  12-12-2005  DR Vertell Limber   LEFT  C6 - C7 HERINATED / DDD/ SPONDYLOSIS  . COLONOSCOPY N/A 02/10/2014   Procedure: COLONOSCOPY;  Surgeon: Danie Binder, MD;  [facility];  Service: Endoscopy;  Laterality: N/A;  8:30  . CYSTOCELE REPAIR  08/02/2012   Procedure: ANTERIOR REPAIR (CYSTOCELE);  Surgeon: Ailene Rud, MD;  Location[facility];  Service: Urology;  Laterality: N/A;  [employer] Uphold Anterior Pelvic Floor Sacrospinus Repair. Anterior wall of the vagina.  . ESOPHAGOGASTRODUODENOSCOPY N/A 02/10/2014    Procedure: ESOPHAGOGASTRODUODENOSCOPY (EGD);  Surgeon: Danie Binder, MD;  [facility];  Service: Endoscopy;  Laterality: N/A;  . GIVENS CAPSULE STUDY N/A 03/13/2014   Procedure: GIVENS CAPSULE STUDY;  Surgeon: Danie Binder, MD;  [facility];  Service: Endoscopy;  Laterality: N/A;  7:30  . NECK SURGERY  2007  . RIGHT PARTIAL MASTECTOMY  11-11-2004  DR [name] YOUNG   DUCTAL CARCINOMA IN SITU RIGHT BREAST  . TOTAL ABDOMINAL HYSTERECTOMY W/ BILATERAL SALPINGOOPHORECTOMY  1990  . TRANSTHORACIC ECHOCARDIOGRAM  03-30-2008  DR MARGARET SIMPSON   LV SIZE AND FUNCTION NORMAL/ MODERATE AORTIC ARCH DILATATION/ MILD MR   Social History   Occupational History  . Not on file  Tobacco Use  . Smoking status: Never Smoker  . Smokeless tobacco: Never Used  Substance and  Sexual Activity  . Alcohol use: No  . Drug use: No  . Sexual activity: Not Currently

## 2018-07-09 ENCOUNTER — Encounter (HOSPITAL_COMMUNITY): Payer: Self-pay | Admitting: Physical Therapy

## 2018-07-09 ENCOUNTER — Ambulatory Visit (HOSPITAL_COMMUNITY): Payer: PPO | Admitting: Physical Therapy

## 2018-07-09 DIAGNOSIS — M6281 Muscle weakness (generalized): Secondary | ICD-10-CM

## 2018-07-09 DIAGNOSIS — R2689 Other abnormalities of gait and mobility: Secondary | ICD-10-CM

## 2018-07-09 DIAGNOSIS — M25661 Stiffness of right knee, not elsewhere classified: Secondary | ICD-10-CM

## 2018-07-09 DIAGNOSIS — M25561 Pain in right knee: Secondary | ICD-10-CM | POA: Diagnosis not present

## 2018-07-09 DIAGNOSIS — G8929 Other chronic pain: Secondary | ICD-10-CM

## 2018-07-09 NOTE — Therapy (Signed)
[place], [place], [address] Phone: [phone]   Fax:  [phone]  Physical Therapy Treatment  Patient Details  Name: Desiree [name] MRN: [id number] Date of Birth: Feb 22, 1946 Referring Provider (PT): [name], MD   Encounter Date: 07/09/2018  PT End of Session - 07/09/18 1436    Visit Number  8    Number of Visits  13    Date for PT Re-Evaluation  07/22/18   MInireassess 07/01/2018   Authorization Type  [employer] (no visit limit, no auth required)    Authorization Time Period  06/10/18 - 07/23/18    Authorization - Visit Number  8    Authorization - Number of Visits  13    PT Start Time  4403    PT Stop Time  1424    PT Time Calculation (min)  39 min    Activity Tolerance  Patient tolerated treatment well;No increased pain;Patient limited by pain    Behavior During Therapy  [facility] for tasks assessed/performed       Past Medical History:  Diagnosis Date  . Allergic rhinitis   . Arthritis HIPS/ WRISTS  . DDD (degenerative disc disease), cervical   . GERD (gastroesophageal reflux disease) OCCASIONAL  . Heart murmur ASYMPTOMATIC  . History of Bell's palsy 2011  RIGHT SIDE -- RESOLVED  . 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 History of CVA (cerebrovascular accident) PER SCAN IN 2011  . Hyperlipidemia   . Hypertension   . Impaired fasting glucose PER PCP  DR SIMPSON   WATCH DIET  . Mixed urge and stress incontinence   . Nocturia   . Sinus drainage   . Thoracic ascending aortic aneurysm ([place]) LAST CHEST CT 09-02-2010--  FOLLOWED BY  CARDIOLOGIST--  DR [id number]  . Vaginal wall prolapse     Past Surgical History:  Procedure Laterality Date  . BILATERAL CARPAL TUNNEL RELEASE  1980'S  . CARDIAC CATHETERIZATION  02-01-2009  DR EICHHORN   NORMAL CORONARY ARTERIES/ NORMAL LV SIZE AND FUNCTION/ AORTIC ROOT SIZE AT  THE UPPER LIMIT OF NORMAL   . CARPAL TUNNEL RELEASE    . CERVICAL DISC SURGERY  12-12-2005  DR Vertell Limber   LEFT  C6 - C7 HERINATED / DDD/ SPONDYLOSIS  . COLONOSCOPY N/A 02/10/2014   Procedure: COLONOSCOPY;  Surgeon: Danie Binder, MD;  [facility];  Service: Endoscopy;  Laterality: N/A;  8:30  . CYSTOCELE REPAIR  08/02/2012   Procedure: ANTERIOR REPAIR (CYSTOCELE);  Surgeon: Ailene Rud, MD;  [facility];  Service: Urology;  Laterality: N/A;  [employer] Uphold Anterior Pelvic Floor Sacrospinus Repair. Anterior wall of the vagina.  . ESOPHAGOGASTRODUODENOSCOPY N/A 02/10/2014   Procedure: ESOPHAGOGASTRODUODENOSCOPY (EGD);  Surgeon: Danie Binder, MD;  [facility];  Service: Endoscopy;  Laterality: N/A;  . GIVENS CAPSULE STUDY N/A 03/13/2014   Procedure: GIVENS CAPSULE STUDY;  Surgeon: Danie Binder, MD;  [facility];  Service: Endoscopy;  Laterality: N/A;  7:30  . NECK SURGERY  2007  . RIGHT PARTIAL MASTECTOMY  11-11-2004  DR [name] YOUNG   DUCTAL CARCINOMA IN SITU RIGHT BREAST  . TOTAL ABDOMINAL HYSTERECTOMY W/ BILATERAL SALPINGOOPHORECTOMY  1990  . TRANSTHORACIC ECHOCARDIOGRAM  03-30-2008  DR MARGARET SIMPSON   LV SIZE AND FUNCTION NORMAL/ MODERATE AORTIC ARCH DILATATION/ MILD MR    There  were no vitals filed for this visit.  Subjective Assessment - 07/09/18 1348    Subjective  Patient stated she is having some right knee pain that she would rate as a 6/10.     Pertinent History  fall on 06/04/18    Limitations  Standing;Sitting;Walking;House hold activities    How long can you sit comfortably?  prolonged periods is uncomfortable    How long can you stand comfortably?  prolonged standign incresases pain    How long can you walk comfortably?  no difficulty    Patient Stated Goals  to improve walking and reduce pain    Currently in Pain?  Yes    Pain Score  6     Pain Location  Knee    Pain Orientation   Right;Lateral    Pain Descriptors / Indicators  Aching    Pain Onset  More than a month ago                       [facility] Adult PT Treatment/Exercise - 07/09/18 0001      Exercises   Exercises  Knee/Hip      Knee/Hip Exercises: Stretches   Knee: Self-Stretch to increase Flexion  Both;5 reps;10 seconds    Knee: Self-Stretch Limitations  knee drive 12" step    Gastroc Stretch  Both;30 seconds;2 reps    Gastroc Stretch Limitations  Runner's stretch      Knee/Hip Exercises: Standing   Hip Abduction  Both;2 sets;10 reps    Abduction Limitations  RTB 3" holds    Hip Extension  Both;2 sets;10 reps    Extension Limitations  3" holds (Held theraband due to fatigue and discomfort this session)    Lateral Step Up  Right;10 reps;Step Height: 4";Hand Hold: 2    Forward Step Up  Right;10 reps;Step Height: 4";Hand Hold: 2    Functional Squat  1 set;10 reps    Rocker Board  Other (comment)    Rocker Board Limitations  2 minutes Left/Right; 1 minute holding rockerboard steady each direction    Other Standing Knee Exercises  tandem stance foam 3x10" holds each foot forward    Other Standing Knee Exercises  sidestepping 60ftx2RT with mini-squats             PT Education - 07/09/18 1436    Education Details  Discussed purpose and technique of interventions throughout session.    Person(s) Educated  Patient    Methods  Explanation    Comprehension  Verbalized understanding       PT Short Term Goals - 07/01/18 1524      PT SHORT TERM GOAL #1   Title  Patient will be independe with HEP, updated PRN, to improve funcitonal ROM and strength for bil LE's to improve activity tolerance.     Baseline  07/01/18:  Reports compliance with HEP 2x/ daily    Status  Achieved      PT SHORT TERM GOAL #2   Title  Patient will improve MMT by 1/2 grade for limited groups to demonstrate significnat improvement in LE function and improve endurance with walking and and daily activities.      Baseline  07/01/18: see MMT    Status  On-going      PT SHORT TERM GOAL #3   Title  Patient will perform SLS for 15 seconds on bil LE to indicate improved balance and reduced fall risk with gait and stair mobility.    Baseline  11/07: Rt 26", Lt 25" max of 2    Status  Achieved        PT Long Term Goals - 07/01/18 1753      PT LONG TERM GOAL #1   Title  Patient will improve MMT by 1 grade for limited groups to demonstrate significnat improvement in LE function and improve endurance with walking and and daily activities.     Status  On-going      PT LONG TERM GOAL #2   Title  Patient will ambulate at = or > 1.0 m/s with LRAD during 2MWT to demonstrate safe community walking speed, reduced fall risk, and improved community access.    Baseline  07/01/2018: 2MWT 1.39 m/s (was .88 m/s)    Status  Achieved      PT LONG TERM GOAL #3   Title  Patient will improve AROM for bil knees to 0-120 to obtain functional ROM without pain for improved quality and performance of gait and stairs ambulation.    Baseline  07/01/18: Rt 111 degrees (was 95 deg); Lt 110 degrees (was 110 deg)    Status  On-going      PT LONG TERM GOAL #4   Title  Patient will reports no pain for entire week while participating in HEP and walking program of 30 minutes or more, 3x/week to improve participation in regular exercise with no increased pain.     Baseline  07/01/18:  Reports she has began walking program for 30 minutes 2x/week    Status  On-going            Plan - 07/09/18 1442    Clinical Impression Statement  This session continued with established plan of care with modifications as needed as patient reported a higher level of pain this session. Patient performed lateral rockerboard this session as well as balance activities maintaining rockerboard in midline with it facing anterior and laterally. Plan to continue progressing patient as able at next session monitoring for pain level.     Rehab Potential  Good     PT Duration  6 weeks    PT Treatment/Interventions  ADLs/Self Care Home Management;Cryotherapy;Electrical Stimulation;Moist Heat;Gait training;Stair training;Functional mobility training;Therapeutic activities;Therapeutic exercise;Balance training;Neuromuscular re-education;Patient/family education;Manual techniques;Passive range of motion;Taping    PT Next Visit Plan  Continue joint mobilization for pain, grade I/II to bil knee joints. Focus on hip and LE strengthening in standing. contiue balance training.    PT Home Exercise Plan  Eval: bridge, SLR; 10/23: SLS       Patient will benefit from skilled therapeutic intervention in order to improve the following deficits and impairments:  Abnormal gait, Pain, Decreased mobility, Postural dysfunction, Decreased activity tolerance, Decreased endurance, Decreased range of motion, Decreased strength, Hypomobility, Impaired flexibility, Increased edema, Difficulty walking, Decreased balance  Visit Diagnosis: Chronic pain of right knee  Stiffness of right knee, not elsewhere classified  Other abnormalities of gait and mobility  Muscle weakness (generalized)     Problem List Patient Active Problem List   Diagnosis Date Noted  . Fall at home, subsequent encounter 06/07/2018  . Knee pain, right 05/31/2017  . CKD (chronic kidney disease) stage 3, GFR 30-59 ml/min (HCC) 10/01/2016  . 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 Urinary incontinence 05/22/2014  . Metabolic syndrome X 09/38/1829  . Osteopenia 12/12/2013  . Anemia 11/29/2013  . Arthritis of knee, right 02/21/2013  . Obesity (BMI 30-39.9) 10/17/2012  . Thoracic aortic aneurysm ([place]) 10/12/2012  . Allergic rhinitis 03/02/2012  .  BLADDER PROLAPSE 09/24/2010  . NEOPLASM, MALIGNANT, BREAST, RIGHT 02/04/2008  . Hyperlipidemia LDL goal <100 02/04/2008  . Essential hypertension 02/04/2008   Desiree [name] PT, DPT 2:44 PM, 07/09/18 [place], [place], [address] Phone: [phone]   Fax:  [phone]  Name: Desiree [name] MRN: [id number] Date of Birth: Dec 26, 1945

## 2018-07-12 ENCOUNTER — Ambulatory Visit (HOSPITAL_COMMUNITY): Payer: Self-pay

## 2018-07-13 ENCOUNTER — Encounter (HOSPITAL_COMMUNITY): Payer: Self-pay

## 2018-07-14 ENCOUNTER — Ambulatory Visit (HOSPITAL_COMMUNITY): Payer: PPO

## 2018-07-14 ENCOUNTER — Encounter (HOSPITAL_COMMUNITY): Payer: Self-pay

## 2018-07-14 ENCOUNTER — Other Ambulatory Visit: Payer: Self-pay

## 2018-07-14 DIAGNOSIS — M6281 Muscle weakness (generalized): Secondary | ICD-10-CM

## 2018-07-14 DIAGNOSIS — M25661 Stiffness of right knee, not elsewhere classified: Secondary | ICD-10-CM

## 2018-07-14 DIAGNOSIS — G8929 Other chronic pain: Secondary | ICD-10-CM

## 2018-07-14 DIAGNOSIS — M25561 Pain in right knee: Principal | ICD-10-CM

## 2018-07-14 DIAGNOSIS — R2689 Other abnormalities of gait and mobility: Secondary | ICD-10-CM

## 2018-07-14 NOTE — Therapy (Signed)
[place], [place], [address] Phone: [phone]   Fax:  [phone]  Physical Therapy Treatment  Patient Details  Name: Desiree [name] MRN: [id number] Date of Birth: 12-Jun-1946 Referring Provider (PT): [name], MD   Encounter Date: 07/14/2018  PT End of Session - 07/14/18 1417    Visit Number  9    Number of Visits  13    Date for PT Re-Evaluation  07/22/18   MInireassess 07/01/2018   Authorization Type  [employer] (no visit limit, no auth required)    Authorization Time Period  06/10/18 - 07/23/18    Authorization - Visit Number  9    Authorization - Number of Visits  13    PT Start Time  0867    PT Stop Time  6195    PT Time Calculation (min)  40 min    Activity Tolerance  Patient tolerated treatment well;No increased pain;Patient limited by pain    Behavior During Therapy  [facility] for tasks assessed/performed       Past Medical History:  Diagnosis Date  . Allergic rhinitis   . Arthritis HIPS/ WRISTS  . DDD (degenerative disc disease), cervical   . GERD (gastroesophageal reflux disease) OCCASIONAL  . Heart murmur ASYMPTOMATIC  . History of Bell's palsy 2011  RIGHT SIDE -- RESOLVED  . 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 History of CVA (cerebrovascular accident) PER SCAN IN 2011  . Hyperlipidemia   . Hypertension   . Impaired fasting glucose PER PCP  DR SIMPSON   WATCH DIET  . Mixed urge and stress incontinence   . Nocturia   . Sinus drainage   . Thoracic ascending aortic aneurysm ([place]) LAST CHEST CT 09-02-2010--  FOLLOWED BY  CARDIOLOGIST--  DR [id number]  . Vaginal wall prolapse     Past Surgical History:  Procedure Laterality Date  . BILATERAL CARPAL TUNNEL RELEASE  1980'S  . CARDIAC CATHETERIZATION  02-01-2009  DR EICHHORN   NORMAL CORONARY ARTERIES/ NORMAL LV SIZE AND FUNCTION/ AORTIC ROOT SIZE AT  THE UPPER LIMIT OF NORMAL   . CARPAL TUNNEL RELEASE    . CERVICAL DISC SURGERY  12-12-2005  DR Vertell Limber   LEFT  C6 - C7 HERINATED / DDD/ SPONDYLOSIS  . COLONOSCOPY N/A 02/10/2014   Procedure: COLONOSCOPY;  Surgeon: Danie Binder, MD;  [facility];  Service: Endoscopy;  Laterality: N/A;  8:30  . CYSTOCELE REPAIR  08/02/2012   Procedure: ANTERIOR REPAIR (CYSTOCELE);  Surgeon: Ailene Rud, MD;  Location: [facility];  Service: Urology;  Laterality: N/A;  [employer] Uphold Anterior Pelvic Floor Sacrospinus Repair. Anterior wall of the vagina.  . ESOPHAGOGASTRODUODENOSCOPY N/A 02/10/2014   Procedure: ESOPHAGOGASTRODUODENOSCOPY (EGD);  Surgeon: Danie Binder, MD;  [facility];  Service: Endoscopy;  Laterality: N/A;  . GIVENS CAPSULE STUDY N/A 03/13/2014   Procedure: GIVENS CAPSULE STUDY;  Surgeon: Danie Binder, MD;  [facility];  Service: Endoscopy;  Laterality: N/A;  7:30  . NECK SURGERY  2007  . RIGHT PARTIAL MASTECTOMY  11-11-2004  DR [name] YOUNG   DUCTAL CARCINOMA IN SITU RIGHT BREAST  . TOTAL ABDOMINAL HYSTERECTOMY W/ BILATERAL SALPINGOOPHORECTOMY  1990  . TRANSTHORACIC ECHOCARDIOGRAM  03-30-2008  DR MARGARET SIMPSON   LV SIZE AND FUNCTION NORMAL/ MODERATE AORTIC ARCH DILATATION/ MILD MR    There  were no vitals filed for this visit.   Subjective Assessment - 07/14/18 1359    Subjective  Patient reports her right knee popped earlier today as she was getting up from sitting today. She states she had to stand still until it subsided. She reports when that happened her pain was around an 8/10 but denies pain at this time.    Pertinent History  fall on 06/04/18    Limitations  Standing;Sitting;Walking;House hold activities    How long can you sit comfortably?  prolonged periods is uncomfortable    How long can you stand comfortably?  prolonged standign incresases pain    How long can you walk comfortably?  no difficulty     Patient Stated Goals  to improve walking and reduce pain    Currently in Pain?  No/denies         [facility] Adult PT Treatment/Exercise - 07/14/18 0001      Exercises   Exercises  Knee/Hip      Knee/Hip Exercises: Aerobic   Stationary Bike  3' on bike seat 9, pt stopped due to cramp in Lt thigh      Knee/Hip Exercises: Standing   Lateral Step Up  Hand Hold: 2;15 reps;Step Height: 6";Both    Forward Step Up  Hand Hold: 1;15 reps;Both;Step Height: 6"    Functional Squat  2 sets;10 reps    Functional Squat Limitations  taps at mat table    SLS with Vectors  3 way vecotrs, 3 sec holds, 5 reps bil LE, no UE support    Other Standing Knee Exercises  sidestepping with Red TB, 3x RT. 64ft with mini-squats      Manual Therapy   Manual Therapy  Joint mobilization;Passive ROM    Manual therapy comments  Manual complete separate than rest of tx    Joint Mobilization  3x 30-45 second grade III oscillations to Rt/Lt tibiofemoral joint    Passive ROM  3x 10 reps flex/ext bil knees between mobilization          07/14/18 1441  PT Education  Education Details Edcuated on purpose of manual interventions and of exercises throughout session.   Person(s) Educated Patient  Methods Explanation  Comprehension Verbalized understanding      PT Short Term Goals - 07/01/18 1524      PT SHORT TERM GOAL #1   Title  Patient will be independe with HEP, updated PRN, to improve funcitonal ROM and strength for bil LE's to improve activity tolerance.     Baseline  07/01/18:  Reports compliance with HEP 2x/ daily    Status  Achieved      PT SHORT TERM GOAL #2   Title  Patient will improve MMT by 1/2 grade for limited groups to demonstrate significnat improvement in LE function and improve endurance with walking and and daily activities.     Baseline  07/01/18: see MMT    Status  On-going      PT SHORT TERM GOAL #3   Title  Patient will perform SLS for 15 seconds on bil LE to indicate improved balance and  reduced fall risk with gait and stair mobility.    Baseline  11/07: Rt 26", Lt 25" max of 2    Status  Achieved        PT Long Term Goals - 07/01/18 1753      PT LONG TERM GOAL #1   Title  Patient will improve MMT by 1 grade for limited groups to  demonstrate significnat improvement in LE function and improve endurance with walking and and daily activities.     Status  On-going      PT LONG TERM GOAL #2   Title  Patient will ambulate at = or > 1.0 m/s with LRAD during 2MWT to demonstrate safe community walking speed, reduced fall risk, and improved community access.    Baseline  07/01/2018: 2MWT 1.39 m/s (was .88 m/s)    Status  Achieved      PT LONG TERM GOAL #3   Title  Patient will improve AROM for bil knees to 0-120 to obtain functional ROM without pain for improved quality and performance of gait and stairs ambulation.    Baseline  07/01/18: Rt 111 degrees (was 95 deg); Lt 110 degrees (was 110 deg)    Status  On-going      PT LONG TERM GOAL #4   Title  Patient will reports no pain for entire week while participating in HEP and walking program of 30 minutes or more, 3x/week to improve participation in regular exercise with no increased pain.     Baseline  07/01/18:  Reports she has began walking program for 30 minutes 2x/week    Status  On-going        Plan - 07/14/18 1420    Clinical Impression Statement  This session continued with plan of care to address mobility restriction and reduce pain for Rt knee pain. Session started with AROM on stationary bike, after ~ 3 minutes patient developed a cramp in her Lt thigh and exercise was discontinued. She complained that her Rt knee popped as she stepped off the bike and joint mobilizations were performed to relieve pain and improve mobility. Functional step up training was progressed with step height, patient denied difficulty with this. She demonstrated improved balance with SLS today and did not use UE support at all. She will continue  to beneift from skilled PT interventions and should be re-assessed soon to determine progress towards goals.    Rehab Potential  Good    PT Duration  6 weeks    PT Treatment/Interventions  ADLs/Self Care Home Management;Cryotherapy;Electrical Stimulation;Moist Heat;Gait training;Stair training;Functional mobility training;Therapeutic activities;Therapeutic exercise;Balance training;Neuromuscular re-education;Patient/family education;Manual techniques;Passive range of motion;Taping    PT Next Visit Plan  Continue joint mobilization for pain, grade I/II to bil knee joints. Focus on hip and LE strengthening in standing. continue balance training. Do not perform bike next session. Re-assess ROM and strength.    PT Home Exercise Plan  Eval: bridge, SLR; 10/23: SLS    Consulted and Agree with Plan of Care  Patient       Patient will benefit from skilled therapeutic intervention in order to improve the following deficits and impairments:  Abnormal gait, Pain, Decreased mobility, Postural dysfunction, Decreased activity tolerance, Decreased endurance, Decreased range of motion, Decreased strength, Hypomobility, Impaired flexibility, Increased edema, Difficulty walking, Decreased balance  Visit Diagnosis: Chronic pain of right knee  Stiffness of right knee, not elsewhere classified  Other abnormalities of gait and mobility  Muscle weakness (generalized)     Problem List Patient Active Problem List   Diagnosis Date Noted  . Fall at home, subsequent encounter 06/07/2018  . Knee pain, right 05/31/2017  . CKD (chronic kidney disease) stage 3, GFR 30-59 ml/min (HCC) 10/01/2016  . 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 Urinary incontinence 05/22/2014  . Metabolic syndrome X 63/14/9702  . Osteopenia 12/12/2013  . Anemia 11/29/2013  . Arthritis of knee,  right 02/21/2013  . Obesity (BMI 30-39.9) 10/17/2012  . Thoracic aortic aneurysm ([place]) 10/12/2012  . Allergic rhinitis  03/02/2012  . BLADDER PROLAPSE 09/24/2010  . NEOPLASM, MALIGNANT, BREAST, RIGHT 02/04/2008  . Hyperlipidemia LDL goal <100 02/04/2008  . Essential hypertension 02/04/2008    Kipp Brood, PT, DPT Physical Therapist with [facility]  07/14/2018 2:49 PM    [place], [place], [address] Phone: [phone]   Fax:  [phone]  Name: CATELYNN SPARGER MRN: [id number] Date of Birth: 04/12/1946

## 2018-07-15 ENCOUNTER — Ambulatory Visit (HOSPITAL_COMMUNITY): Payer: PPO

## 2018-07-15 DIAGNOSIS — M25561 Pain in right knee: Principal | ICD-10-CM

## 2018-07-15 DIAGNOSIS — R2689 Other abnormalities of gait and mobility: Secondary | ICD-10-CM

## 2018-07-15 DIAGNOSIS — M6281 Muscle weakness (generalized): Secondary | ICD-10-CM

## 2018-07-15 DIAGNOSIS — G8929 Other chronic pain: Secondary | ICD-10-CM

## 2018-07-15 DIAGNOSIS — M25661 Stiffness of right knee, not elsewhere classified: Secondary | ICD-10-CM

## 2018-07-15 NOTE — Patient Instructions (Signed)
Side Stepping with Resistance at Thighs reps: 10 sets: 2-3 daily: 1 weekly: 7   Exercise image step 1   Exercise image step 2 Setup  Begin standing upright with a resistance band looped around your thighs, just above your knees. Bend your knees slightly so you are in a mini squat position. Movement  Slowly step sideways, maintaining tension in the band.  Tip  Make sure to keep your feet pointing straight forward and do not let your knees collapse inward during the exercise.

## 2018-07-15 NOTE — Therapy (Signed)
[place], [place], [address] Phone: [phone]   Fax:  [phone]  Physical Therapy Treatment  Patient Details  Name: Desiree [name] MRN: [id number] Date of Birth: 1946/01/27 Referring Provider (PT): [name], MD   Encounter Date: 07/15/2018  PT End of Session - 07/15/18 1518    Visit Number  10    Number of Visits  13    Date for PT Re-Evaluation  07/22/18   MInireassess 07/01/2018   Authorization Type  [employer] (no visit limit, no auth required)    Authorization Time Period  06/10/18 - 07/23/18    Authorization - Visit Number  4    Authorization - Number of Visits  10    PT Start Time  7673    PT Stop Time  1559    PT Time Calculation (min)  41 min    Activity Tolerance  Patient tolerated treatment well;No increased pain;Patient limited by pain    Behavior During Therapy  [facility] for tasks assessed/performed       Past Medical History:  Diagnosis Date  . Allergic rhinitis   . Arthritis HIPS/ WRISTS  . DDD (degenerative disc disease), cervical   . GERD (gastroesophageal reflux disease) OCCASIONAL  . Heart murmur ASYMPTOMATIC  . History of Bell's palsy 2011  RIGHT SIDE -- RESOLVED  . 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 History of CVA (cerebrovascular accident) PER SCAN IN 2011  . Hyperlipidemia   . Hypertension   . Impaired fasting glucose PER PCP  DR SIMPSON   WATCH DIET  . Mixed urge and stress incontinence   . Nocturia   . Sinus drainage   . Thoracic ascending aortic aneurysm ([place]) LAST CHEST CT 09-02-2010--  FOLLOWED BY  CARDIOLOGIST--  DR [id number]  . Vaginal wall prolapse     Past Surgical History:  Procedure Laterality Date  . BILATERAL CARPAL TUNNEL RELEASE  1980'S  . CARDIAC CATHETERIZATION  02-01-2009  DR EICHHORN   NORMAL CORONARY ARTERIES/ NORMAL LV SIZE AND FUNCTION/ AORTIC ROOT SIZE AT  THE UPPER LIMIT OF NORMAL   . CARPAL TUNNEL RELEASE    . CERVICAL DISC SURGERY  12-12-2005  DR Vertell Limber   LEFT  C6 - C7 HERINATED / DDD/ SPONDYLOSIS  . COLONOSCOPY N/A 02/10/2014   Procedure: COLONOSCOPY;  Surgeon: Danie Binder, MD;  [facility];  Service: Endoscopy;  Laterality: N/A;  8:30  . CYSTOCELE REPAIR  08/02/2012   Procedure: ANTERIOR REPAIR (CYSTOCELE);  Surgeon: Ailene Rud, MD;  Location[facility];  Service: Urology;  Laterality: N/A;  [employer] Uphold Anterior Pelvic Floor Sacrospinus Repair. Anterior wall of the vagina.  . ESOPHAGOGASTRODUODENOSCOPY N/A 02/10/2014   Procedure: ESOPHAGOGASTRODUODENOSCOPY (EGD);  Surgeon: Danie Binder, MD;  [facility];  Service: Endoscopy;  Laterality: N/A;  . GIVENS CAPSULE STUDY N/A 03/13/2014   Procedure: GIVENS CAPSULE STUDY;  Surgeon: Danie Binder, MD;  [facility];  Service: Endoscopy;  Laterality: N/A;  7:30  . NECK SURGERY  2007  . RIGHT PARTIAL MASTECTOMY  11-11-2004  DR [name] YOUNG   DUCTAL CARCINOMA IN SITU RIGHT BREAST  . TOTAL ABDOMINAL HYSTERECTOMY W/ BILATERAL SALPINGOOPHORECTOMY  1990  . TRANSTHORACIC ECHOCARDIOGRAM  03-30-2008  DR MARGARET SIMPSON   LV SIZE AND FUNCTION NORMAL/ MODERATE AORTIC ARCH DILATATION/ MILD MR    There  were no vitals filed for this visit.  Subjective Assessment - 07/15/18 1558    Subjective  Patient is well today and denies pain at start of session. She states she is tired today as she just finished working.     Pertinent History  fall on 06/04/18    Limitations  Standing;Sitting;Walking;House hold activities    How long can you sit comfortably?  prolonged periods is uncomfortable    How long can you stand comfortably?  prolonged standign incresases pain    How long can you walk comfortably?  no difficulty    Patient Stated Goals  to improve walking and reduce pain    Currently in Pain?  No/denies       [facility] Adult PT  Treatment/Exercise - 07/15/18 0001      Exercises   Exercises  Knee/Hip      Knee/Hip Exercises: Standing   Heel Raises  Both;1 set;15 reps;3 seconds   on incline   Heel Raises Limitations  toe raises, 15 reps, on decline    Lateral Step Up  Hand Hold: 2;15 reps;Step Height: 6";Both    Forward Step Up  15 reps;Both;Step Height: 6";Hand Hold: 0;1 set    Functional Squat  2 sets;10 reps    Functional Squat Limitations  taps at mat table   lower   Wall Squat  5 seconds;5 reps;1 set;2 sets;10 reps   1x 10 reps, 2x 5 reps   Rocker Board  2 minutes;Limitations    Rocker Board Limitations  2x 1 minute lateral to maintain midline    SLS with Vectors  3 way vecotrs, 3 sec holds, 5 reps bil LE, no UE support, on foam    Other Standing Knee Exercises  sidestepping with Red TB, 3x RT. 47ft with mini-squats      Knee/Hip Exercises: Supine   Bridges  Both;1 set;15 reps      Knee/Hip Exercises: Sidelying   Clams  1x 15 reps bil LE         PT Education - 07/15/18 1558    Education Details  Educated on exercises throughout session and provided updated HEP.    Person(s) Educated  Patient    Methods  Explanation    Comprehension  Verbalized understanding;Returned demonstration       PT Short Term Goals - 07/01/18 1524      PT SHORT TERM GOAL #1   Title  Patient will be independe with HEP, updated PRN, to improve funcitonal ROM and strength for bil LE's to improve activity tolerance.     Baseline  07/01/18:  Reports compliance with HEP 2x/ daily    Status  Achieved      PT SHORT TERM GOAL #2   Title  Patient will improve MMT by 1/2 grade for limited groups to demonstrate significnat improvement in LE function and improve endurance with walking and and daily activities.     Baseline  07/01/18: see MMT    Status  On-going      PT SHORT TERM GOAL #3   Title  Patient will perform SLS for 15 seconds on bil LE to indicate improved balance and reduced fall risk with gait and stair mobility.     Baseline  11/07: Rt 26", Lt 25" max of 2    Status  Achieved        PT Long Term Goals - 07/01/18 1753      PT LONG TERM GOAL #1   Title  Patient will improve MMT by  1 grade for limited groups to demonstrate significnat improvement in LE function and improve endurance with walking and and daily activities.     Status  On-going      PT LONG TERM GOAL #2   Title  Patient will ambulate at = or > 1.0 m/s with LRAD during 2MWT to demonstrate safe community walking speed, reduced fall risk, and improved community access.    Baseline  07/01/2018: 2MWT 1.39 m/s (was .88 m/s)    Status  Achieved      PT LONG TERM GOAL #3   Title  Patient will improve AROM for bil knees to 0-120 to obtain functional ROM without pain for improved quality and performance of gait and stairs ambulation.    Baseline  07/01/18: Rt 111 degrees (was 95 deg); Lt 110 degrees (was 110 deg)    Status  On-going      PT LONG TERM GOAL #4   Title  Patient will reports no pain for entire week while participating in HEP and walking program of 30 minutes or more, 3x/week to improve participation in regular exercise with no increased pain.     Baseline  07/01/18:  Reports she has began walking program for 30 minutes 2x/week    Status  On-going        Plan - 07/15/18 1557    Clinical Impression Statement  Continued with current POC for therapy to address knee pain and strengthen LE's. Patient progressed functional endurance strengthening with wall squats today and continued with theraband sidestepping for glut med strengthening. She attempted side-lying hip abduction but reported knee locking sensation and exercise was regressed to clamshell, which she denied pain with.  Patient also advanced SLS balance challenge to dynamic surface and was able to maintain balance demonstrating good ankle, hip, and postural strategies. She will continue to benefit from skilled PT interventions and should be re-assessed soon to determine progress  towards goals.    Rehab Potential  Good    PT Duration  6 weeks    PT Treatment/Interventions  ADLs/Self Care Home Management;Cryotherapy;Electrical Stimulation;Moist Heat;Gait training;Stair training;Functional mobility training;Therapeutic activities;Therapeutic exercise;Balance training;Neuromuscular re-education;Patient/family education;Manual techniques;Passive range of motion;Taping    PT Next Visit Plan  Continue joint mobilization for pain, grade I/II to bil knee joints. Focus on hip and LE strengthening in standing. Progress balance training to dynamic surface and activities. Do not perform bike next session. Update HEP with hip strengthening exercises.     PT Home Exercise Plan  Eval: bridge, SLR; 10/23: SLS; 07/15/18 - side step with TB    Consulted and Agree with Plan of Care  Patient       Patient will benefit from skilled therapeutic intervention in order to improve the following deficits and impairments:  Abnormal gait, Pain, Decreased mobility, Postural dysfunction, Decreased activity tolerance, Decreased endurance, Decreased range of motion, Decreased strength, Hypomobility, Impaired flexibility, Increased edema, Difficulty walking, Decreased balance  Visit Diagnosis: Chronic pain of right knee  Stiffness of right knee, not elsewhere classified  Other abnormalities of gait and mobility  Muscle weakness (generalized)     Problem List Patient Active Problem List   Diagnosis Date Noted  . Fall at home, subsequent encounter 06/07/2018  . Knee pain, right 05/31/2017  . CKD (chronic kidney disease) stage 3, GFR 30-59 ml/min (HCC) 10/01/2016  . 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 Urinary incontinence 05/22/2014  . Metabolic syndrome X 17/51/0258  . Osteopenia 12/12/2013  . Anemia 11/29/2013  . Arthritis of  knee, right 02/21/2013  . Obesity (BMI 30-39.9) 10/17/2012  . Thoracic aortic aneurysm ([place]) 10/12/2012  . Allergic rhinitis 03/02/2012  .  BLADDER PROLAPSE 09/24/2010  . NEOPLASM, MALIGNANT, BREAST, RIGHT 02/04/2008  . Hyperlipidemia LDL goal <100 02/04/2008  . Essential hypertension 02/04/2008     Kipp Brood, PT, DPT Physical Therapist with [facility]  07/15/2018 4:17 PM    [place], [place], [address] Phone: [phone]   Fax:  [phone]  Name: SATCHA STORLIE MRN: [id number] Date of Birth: 12/06/1945

## 2018-07-19 ENCOUNTER — Ambulatory Visit (HOSPITAL_COMMUNITY): Payer: Self-pay

## 2018-07-20 ENCOUNTER — Encounter (HOSPITAL_COMMUNITY): Payer: Self-pay

## 2018-07-20 ENCOUNTER — Other Ambulatory Visit: Payer: Self-pay

## 2018-07-20 ENCOUNTER — Ambulatory Visit (HOSPITAL_COMMUNITY): Payer: PPO

## 2018-07-20 DIAGNOSIS — M25661 Stiffness of right knee, not elsewhere classified: Secondary | ICD-10-CM

## 2018-07-20 DIAGNOSIS — M25561 Pain in right knee: Principal | ICD-10-CM

## 2018-07-20 DIAGNOSIS — G8929 Other chronic pain: Secondary | ICD-10-CM

## 2018-07-20 DIAGNOSIS — M6281 Muscle weakness (generalized): Secondary | ICD-10-CM

## 2018-07-20 DIAGNOSIS — R2689 Other abnormalities of gait and mobility: Secondary | ICD-10-CM

## 2018-07-20 NOTE — Patient Instructions (Signed)
Standing 3-Way Leg Reach with Resistance at Ankles and Counter Support reps: 10 sets: 3 daily: 1 weekly: 7   Exercise image step 1   Exercise image step 2   Exercise image step 3 Setup  Begin in a standing upright position with one hand resting on a counter in front of you and a resistance band looped around your ankles. Movement  Pulling against the resistance band, slowly lift one leg straight forward, out to your side, backward, and diagonally behind you. Repeat with your opposite leg. Tip  Make sure to keep your leg straight and do not let your trunk rotate during the exercise. Use the counter to help you balance as needed.

## 2018-07-20 NOTE — Therapy (Signed)
[place] [address] [place], [place], [address] Phone: [phone]   Fax:  [phone]  Physical Therapy Treatment/Discharge Summary  Patient Details  Name: Desiree [name] MRN: [id number] Date of Birth: 04-Mar-1946 Referring Provider (PT): [name], MD   Encounter Date: 07/20/2018  PHYSICAL THERAPY DISCHARGE SUMMARY  Visits from Start of Care: 11  Current functional level related to goals / functional outcomes: Re-assessment performed today and patient has met/partially met all goals. She has made significant improvements in bil LE strength and in AROM for bil knee's. Patient has 0-119 (Lt) and 0-120 (Rt). She has improved her SLS balance significantly and gait speed to indicate significant reduction in fall risk. She has been educated on additional exercises to address ongoing weaknesses and encouraged to begin participating in walking program at [facility]. She has reported feeling confident in her ability to continue independently and will be discharged following today's session.   Remaining deficits: See below details.   Education / Equipment: Educated on progress towards goals and on updated HEP for hip strengthening. Discussed readiness for discharge and encouraged to participate in exercise at [facility] and re-join the silver sneakers club.   Plan: Patient agrees to discharge.  Patient goals were met. Patient is being discharged due to meeting the stated rehab goals.  ?????       PT End of Session - 07/20/18 1415    Visit Number  11    Number of Visits  13    Date for PT Re-Evaluation  07/22/18   MInireassess 07/01/2018   Authorization Type  [employer] (no visit limit, no auth required)    Authorization Time Period  06/10/18 - 07/23/18    Authorization - Visit Number  5    Authorization - Number of Visits  10    PT Start Time  6808    PT Stop Time  8110    PT Time Calculation (min)  30 min    Activity  Tolerance  Patient tolerated treatment well;No increased pain;Patient limited by pain    Behavior During Therapy  [facility] for tasks assessed/performed       Past Medical History:  Diagnosis Date  . Allergic rhinitis   . Arthritis HIPS/ WRISTS  . DDD (degenerative disc disease), cervical   . GERD (gastroesophageal reflux disease) OCCASIONAL  . Heart murmur ASYMPTOMATIC  . History of Bell's palsy 2011  RIGHT SIDE -- RESOLVED  . 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 History of CVA (cerebrovascular accident) PER SCAN IN 2011  . Hyperlipidemia   . Hypertension   . Impaired fasting glucose PER PCP  DR SIMPSON   WATCH DIET  . Mixed urge and stress incontinence   . Nocturia   . Sinus drainage   . Thoracic ascending aortic aneurysm ([place]) LAST CHEST CT 09-02-2010--  FOLLOWED BY  CARDIOLOGIST--  DR [id number]  . Vaginal wall prolapse     Past Surgical History:  Procedure Laterality Date  . BILATERAL CARPAL TUNNEL RELEASE  1980'S  . CARDIAC CATHETERIZATION  02-01-2009  DR EICHHORN   NORMAL CORONARY ARTERIES/ NORMAL LV SIZE AND FUNCTION/ AORTIC ROOT SIZE AT THE UPPER LIMIT OF NORMAL   . CARPAL TUNNEL RELEASE    . CERVICAL DISC SURGERY  12-12-2005  DR Vertell Limber   LEFT  C6 - C7 HERINATED / DDD/ SPONDYLOSIS  . COLONOSCOPY N/A 02/10/2014   Procedure: COLONOSCOPY;  Surgeon: Danie Binder, MD;  [facility];  Service: Endoscopy;  Laterality: N/A;  8:30  . CYSTOCELE REPAIR  08/02/2012   Procedure: ANTERIOR REPAIR (CYSTOCELE);  Surgeon: Ailene Rud, MD;  Location: [facility];  Service: Urology;  Laterality: N/A;  [employer] Uphold Anterior Pelvic Floor Sacrospinus Repair. Anterior wall of the vagina.  . ESOPHAGOGASTRODUODENOSCOPY N/A 02/10/2014   Procedure: ESOPHAGOGASTRODUODENOSCOPY (EGD);  Surgeon: Danie Binder, MD;  [facility];  Service: Endoscopy;  Laterality: N/A;  . GIVENS  CAPSULE STUDY N/A 03/13/2014   Procedure: GIVENS CAPSULE STUDY;  Surgeon: Danie Binder, MD;  [facility];  Service: Endoscopy;  Laterality: N/A;  7:30  . NECK SURGERY  2007  . RIGHT PARTIAL MASTECTOMY  11-11-2004  DR [name] YOUNG   DUCTAL CARCINOMA IN SITU RIGHT BREAST  . TOTAL ABDOMINAL HYSTERECTOMY W/ BILATERAL SALPINGOOPHORECTOMY  1990  . TRANSTHORACIC ECHOCARDIOGRAM  03-30-2008  DR MARGARET SIMPSON   LV SIZE AND FUNCTION NORMAL/ MODERATE AORTIC ARCH DILATATION/ MILD MR    There were no vitals filed for this visit.  Subjective Assessment - 07/20/18 1358    Subjective  Patient reports she is doing well today. She does not have pain currently but states she did have some pain this morning in her Rt lower leg.     Pertinent History  fall on 06/04/18    Limitations  Standing;Sitting;Walking;House hold activities    How long can you sit comfortably?  prolonged periods is uncomfortable    How long can you stand comfortably?  prolonged standign incresases pain    How long can you walk comfortably?  no difficulty    Patient Stated Goals  to improve walking and reduce pain    Currently in Pain?  No/denies         [facility] PT Assessment - 07/20/18 0001      Assessment   Medical Diagnosis  Right Knee Pain    Referring Provider (PT)  [name], MD    Onset Date/Surgical Date  06/04/18   several years, but recent fall causing Rt knee swelling   Next MD Visit  February 2020      Precautions   Precautions  None      Restrictions   Weight Bearing Restrictions  No      Home Environment   Living Environment  Private residence    Living Arrangements  Alone    Available Help at Discharge  Family   daughter lives 20 minutes away   Type of [place] to enter    Entrance Stairs-Number of Steps  2    Entrance Stairs-Rails  Can reach both    [place]  One level    [place] - single point    Additional Comments  tub combo shower       Prior Function   Level of Independence  Independent    Vocation  Part time employment    Vocation Requirements  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    Leisure  walking on trail in [place] for tasks assessed      Observation/Other Assessments   Focus on Therapeutic Outcomes (FOTO)   36% limited   was 46% limited     Functional Tests   Functional tests  Single leg stance  Single Leg Stance   Comments  Rt LE = 20 sec, Lt LE = 17 sec      AROM   Right Knee Extension  0    Right Knee Flexion  120   95 at eval   Left Knee Extension  0    Left Knee Flexion  119   110 at eval     PROM   Right Knee Extension  0    Right Knee Flexion  --    Left Knee Extension  0    Left Knee Flexion  --      Strength   Right Hip Flexion  4+/5   3+   Right Hip Extension  4/5   3+   Right Hip ABduction  4+/5   4-   Left Hip Flexion  4+/5   3+   Left Hip Extension  4/5   3+   Left Hip ABduction  4/5   4-   Right Knee Flexion  4+/5   3+   Right Knee Extension  5/5   4+   Left Knee Flexion  4+/5   4-   Left Knee Extension  5/5   4+   Right Ankle Dorsiflexion  5/5   4   Left Ankle Dorsiflexion  5/5   4        [facility] Adult PT Treatment/Exercise - 07/20/18 0001      Knee/Hip Exercises: Standing   Other Standing Knee Exercises  3 way hip with red theraband; 10 reps, bil LE       PT Education - 07/20/18 1358    Education Details  Educated on progress towards goals and on updated HEP for hip strengthening.    Person(s) Educated  Patient    Methods  Explanation    Comprehension  Verbalized understanding       PT Short Term Goals - 07/20/18 1409      PT SHORT TERM GOAL #1   Title  Patient will be independe with HEP, updated PRN, to improve funcitonal ROM and strength for bil LE's to improve activity tolerance.     Baseline  07/01/18:  Reports compliance with HEP 2x/ daily    Status  Achieved      PT  SHORT TERM GOAL #2   Title  Patient will improve MMT by 1/2 grade for limited groups to demonstrate significnat improvement in LE function and improve endurance with walking and and daily activities.     Baseline  07/01/18: see MMT    Status  Achieved      PT SHORT TERM GOAL #3   Title  Patient will perform SLS for 15 seconds on bil LE to indicate improved balance and reduced fall risk with gait and stair mobility.    Baseline  11/07: Rt 26", Lt 25" max of 2    Status  Achieved        PT Long Term Goals - 07/20/18 1409      PT LONG TERM GOAL #1   Title  Patient will improve MMT by 1 grade for limited groups to demonstrate significnat improvement in LE function and improve endurance with walking and and daily activities.     Status  Partially Met      PT LONG TERM GOAL #2   Title  Patient will ambulate at = or > 1.0 m/s with LRAD during 2MWT to demonstrate safe community walking speed, reduced fall risk, and improved community access.    Baseline  07/01/2018: 2MWT 1.39 m/s (was .88 m/s)    Status  Achieved      PT LONG TERM GOAL #3   Title  Patient will improve AROM for bil knees to 0-120 to obtain functional ROM without pain for improved quality and performance of gait and stairs ambulation.    Baseline  07/01/18: Rt 111 degrees (was 95 deg); Lt 110 degrees (was 110 deg)    Status  Partially Met      PT LONG TERM GOAL #4   Title  Patient will reports no pain for entire week while participating in HEP and walking program of 30 minutes or more, 3x/week to improve participation in regular exercise with no increased pain.     Baseline  07/01/18:  Reports she has began walking program for 30 minutes 2x/week    Status  Partially Met        Plan - 07/20/18 1358    Clinical Impression Statement  Re-assessment performed today and patient has met/partially met all goals. She has made significant improvements in bil LE strength and in AROM for bil knee's. Patient has 0-119 (Lt) and 0-120  (Rt). She has improved her SLS balance significantly and gait speed to indicate significant reduction in fall risk. She has been educated on additional exercises to address ongoing weaknesses and encouraged to begin participating in walking program at [facility]. She has reported feeling confident in her ability to continue independently and will be discharged following today's session.    Rehab Potential  Good    PT Duration  6 weeks    PT Treatment/Interventions  ADLs/Self Care Home Management;Cryotherapy;Electrical Stimulation;Moist Heat;Gait training;Stair training;Functional mobility training;Therapeutic activities;Therapeutic exercise;Balance training;Neuromuscular re-education;Patient/family education;Manual techniques;Passive range of motion;Taping    PT Next Visit Plan  discharge    PT Home Exercise Plan  Eval: bridge, SLR; 10/23: SLS; 07/15/18 - side step with TB; 07/20/18 - 3 way hip with TB, side step with TB at ankle    Consulted and Agree with Plan of Care  Patient       Patient will benefit from skilled therapeutic intervention in order to improve the following deficits and impairments:  Abnormal gait, Pain, Decreased mobility, Postural dysfunction, Decreased activity tolerance, Decreased endurance, Decreased range of motion, Decreased strength, Hypomobility, Impaired flexibility, Increased edema, Difficulty walking, Decreased balance  Visit Diagnosis: Chronic pain of right knee  Stiffness of right knee, not elsewhere classified  Other abnormalities of gait and mobility  Muscle weakness (generalized)     Problem List Patient Active Problem List   Diagnosis Date Noted  . Fall at home, subsequent encounter 06/07/2018  . Knee pain, right 05/31/2017  . CKD (chronic kidney disease) stage 3, GFR 30-59 ml/min (HCC) 10/01/2016  . 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 Urinary incontinence 05/22/2014  . Metabolic syndrome X 08/65/7846  . Osteopenia 12/12/2013  .  Anemia 11/29/2013  . Arthritis of knee, right 02/21/2013  . Obesity (BMI 30-39.9) 10/17/2012  . Thoracic aortic aneurysm ([place]) 10/12/2012  . Allergic rhinitis 03/02/2012  . BLADDER PROLAPSE 09/24/2010  . NEOPLASM, MALIGNANT, BREAST, RIGHT 02/04/2008  . Hyperlipidemia LDL goal <100 02/04/2008  . Essential hypertension 02/04/2008    Kipp Brood, PT, DPT Physical Therapist with [facility]  07/20/2018 2:52 PM    [place], [place], [address] Phone: [phone]   Fax:  [phone]  Name: Desiree [name] MRN: [id number] Date of Birth: October 15, 1945

## 2018-07-28 ENCOUNTER — Encounter: Payer: Self-pay | Admitting: Gastroenterology

## 2018-08-19 ENCOUNTER — Encounter: Payer: Self-pay | Admitting: Cardiothoracic Surgery

## 2018-08-19 ENCOUNTER — Ambulatory Visit: Payer: PPO | Admitting: Cardiothoracic Surgery

## 2018-08-19 ENCOUNTER — Other Ambulatory Visit: Payer: Self-pay

## 2018-08-19 ENCOUNTER — Ambulatory Visit
Admission: RE | Admit: 2018-08-19 | Discharge: 2018-08-19 | Disposition: A | Discharge status: Home / Self Care | Payer: PPO | Source: Ambulatory Visit | Attending: Cardiothoracic Surgery | Admitting: Cardiothoracic Surgery

## 2018-08-19 VITALS — BP 130/64 | HR 76 | Resp 16 | Ht 61.5 in | Wt 183.6 lb

## 2018-08-19 DIAGNOSIS — I712 Thoracic aortic aneurysm, without rupture, unspecified: Secondary | ICD-10-CM

## 2018-08-19 MED ORDER — IOPAMIDOL (ISOVUE-370) INJECTION 76%
75.0000 mL | Freq: Once | INTRAVENOUS | Status: AC | PRN
  Administered 2018-08-19: 75 mL via INTRAVENOUS

## 2018-08-19 NOTE — Patient Instructions (Signed)

## 2018-08-19 NOTE — Progress Notes (Signed)
[address]       [place] [address]             [phone]                    Evaluna W Balding Halfway Medical Record #[id number] Date of Birth: 24-Mar-1946  Referring: [name], MD Primary Care: [name], MD Cardiology: Dr  Bertrum Sol  Chief Complaint:    Chief Complaint  Patient presents with  . TAA    1 yr f/u with CTA CHEST    History of Present Illness:    Patient is a 72 -year-old female who has had a known dilated ascending aorta since at least 2009 with serial CT scans of the chest having been performed. She was  referred to me several years ago after a recent CT scan showed 4.4 cm dilated ascending aorta. She has no known previous history of coronary artery disease.  She notes that she has been told that she had a stroke based on scans but does not know when this was or what the symptoms were if any. Patient's had no symptoms of congestive heart failure or angina.  Patient has had no syncope, no presyncope.  She notes she did fall once after losing her balance due to a poor knee joint, but no loss of consciousnes.  She continues to remain active, she notes she does have to slow when climbing stairs.  Previous CT scans are reviewed with descending aorta 4.3 cm 2009   Current Activity/ Functional Status:  Patient is independent with mobility/ambulation, transfers, ADL's, IADL's.  Zubrod Score: At the time of surgery this patient's most appropriate activity status/level should be described as: []  Normal activity, no symptoms [x]  Symptoms, fully ambulatory []  Symptoms, in bed less than or equal to 50% of the time []  Symptoms, in bed greater than 50% of the time but less than 100% []  Bedridden []  Moribund   Past Medical History:  Diagnosis Date  . Allergic rhinitis   . Arthritis HIPS/ WRISTS  . DDD (degenerative disc disease), cervical   . GERD (gastroesophageal reflux disease) OCCASIONAL  . Heart murmur ASYMPTOMATIC    . History of Bell's palsy 2011  RIGHT SIDE -- RESOLVED  . 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 History of CVA (cerebrovascular accident) PER SCAN IN 2011  . Hyperlipidemia   . Hypertension   . Impaired fasting glucose PER PCP  DR SIMPSON   WATCH DIET  . Mixed urge and stress incontinence   . Nocturia   . Sinus drainage   . Thoracic ascending aortic aneurysm ([place]) LAST CHEST CT 09-02-2010--  FOLLOWED BY  CARDIOLOGIST--  DR GYJEHUDJ  . Vaginal wall prolapse     Past Surgical History:  Procedure Laterality Date  . BILATERAL CARPAL TUNNEL RELEASE  1980'S  . CARDIAC CATHETERIZATION  02-01-2009  DR EICHHORN   NORMAL CORONARY ARTERIES/ NORMAL LV SIZE AND FUNCTION/ AORTIC ROOT SIZE AT THE UPPER LIMIT OF NORMAL   . CARPAL TUNNEL RELEASE    . CERVICAL DISC SURGERY  12-12-2005  DR Vertell Limber   LEFT  C6 - C7 HERINATED / DDD/ SPONDYLOSIS  . COLONOSCOPY N/A 02/10/2014   Procedure: COLONOSCOPY;  Surgeon: Danie Binder, MD;  [facility];  Service: Endoscopy;  Laterality: N/A;  8:30  . CYSTOCELE REPAIR  08/02/2012  Procedure: ANTERIOR REPAIR (CYSTOCELE);  Surgeon: Ailene Rud, MD;  Location: [facility];  Service: Urology;  Laterality: N/A;  [employer] Uphold Anterior Pelvic Floor Sacrospinus Repair. Anterior wall of the vagina.  . ESOPHAGOGASTRODUODENOSCOPY N/A 02/10/2014   Procedure: ESOPHAGOGASTRODUODENOSCOPY (EGD);  Surgeon: Danie Binder, MD;  [facility]: [facility];  Service: Endoscopy;  Laterality: N/A;  . GIVENS CAPSULE STUDY N/A 03/13/2014   Procedure: GIVENS CAPSULE STUDY;  Surgeon: Danie Binder, MD;  Location: [facility];  Service: Endoscopy;  Laterality: N/A;  7:30  . NECK SURGERY  2007  . RIGHT PARTIAL MASTECTOMY  11-11-2004  DR [name] YOUNG   DUCTAL CARCINOMA IN SITU RIGHT BREAST  . TOTAL ABDOMINAL HYSTERECTOMY W/ BILATERAL SALPINGOOPHORECTOMY  1990  . TRANSTHORACIC  ECHOCARDIOGRAM  03-30-2008  DR MARGARET SIMPSON   LV SIZE AND FUNCTION NORMAL/ MODERATE AORTIC ARCH DILATATION/ MILD MR    Family History  Problem Relation Age of Onset  . Heart attack Mother 57  . Kidney failure Mother   . Hypertension Mother   . Throat cancer Father 19  . Prostate cancer Father     History   Social History  . Marital Status: Divorced    Spouse Name: N/A    Number of Children: 70  . Years of Education: N/A   Occupational History  . Not on file.   Social History Main Topics  . Smoking status: Never Smoker   . Smokeless tobacco: Never Used  . Alcohol Use: No  . Drug Use: No  . Sexual Activity: Not on file   Other Topics Concern  . 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 Codeine Other (See Comments)    CONFUSION/ DIZZY    Current Outpatient Medications  Medication Sig Dispense Refill  . acetaminophen (TYLENOL) 650 MG CR tablet Take 650 mg by mouth every 8 (eight) hours as needed for pain.    . Ascorbic Acid (VITAMIN C) 1000 MG tablet Take 1,000 mg by mouth daily.    Marland Kitchen aspirin EC 325 MG tablet Take 1 tablet (325 mg total) by mouth daily. 100 tablet 3  . calcium-vitamin D (OSCAL 500/200 D-3) 500-200 MG-UNIT per tablet Take 1 tablet by mouth daily with breakfast.     . carvedilol (COREG) 3.125 MG tablet Take 1 tablet (3.125 mg total) by mouth 2 (two) times daily with a meal. 180 tablet 2  . cholecalciferol (VITAMIN D) 1000 UNITS tablet Take 1,000 Units by mouth daily.    Marland Kitchen gabapentin (NEURONTIN) 100 MG capsule TAKE TWO CAPSULES BY MOUTH ONCE DAILY AT BEDTIME 180 capsule 3  . hydrochlorothiazide (HYDRODIURIL) 25 MG tablet TAKE 1 TABLET BY MOUTH ONCE DAILY IN THE MORNING 90 tablet 1  . ibuprofen (ADVIL,MOTRIN) 800  MG tablet Take 1 tablet (800 mg total) by mouth 3 (three) times daily. 21 tablet 0  . losartan (COZAAR) 50 MG tablet TAKE 1/2 (ONE-HALF) TABLET BY MOUTH ONCE DAILY 45 tablet 3  . lovastatin (MEVACOR) 40 MG tablet TAKE 1 TABLET BY MOUTH ONCE DAILY WITH BREAKFAST 90 tablet 1  . montelukast (SINGULAIR) 10 MG tablet Take 1 tablet (10 mg total) by mouth at bedtime. 90 tablet 1  .  Multiple Vitamin (MULTIVITAMIN) tablet Take 1 tablet by mouth daily.    . potassium chloride SA (K-DUR,KLOR-CON) 20 MEQ tablet Take 10 mEq by mouth daily.    . solifenacin (VESICARE) 10 MG tablet Take 10 mg by mouth daily.     No current facility-administered medications for this visit.        Review of Systems:  ROS   Physical Exam: BP 130/64 (BP Location: Left Arm, Patient Position: Sitting, Cuff Size: Large)   Pulse 76   Resp 16   Ht 5' 1.5" (1.562 m)   Wt 183 lb 9.6 oz (83.3 kg)   BMI 34.13 kg/m  General appearance: alert, cooperative, appears stated age and no distress Head: Normocephalic, without obvious abnormality, atraumatic Neck: no adenopathy, no carotid bruit, no JVD, supple, symmetrical, trachea midline and thyroid not enlarged, symmetric, no tenderness/mass/nodules Lymph nodes: Cervical, supraclavicular, and axillary nodes normal. Resp: clear to auscultation bilaterally Back: symmetric, no curvature. ROM normal. No CVA tenderness. Cardio: regular rate and rhythm and systolic murmur: early systolic 3/6, crescendo at 2nd right intercostal space GI: soft, non-tender; bowel sounds normal; no masses,  no organomegaly Extremities: extremities normal, atraumatic, no cyanosis or edema and Homans sign is negative, no sign of DVT Neurologic: Grossly normal   Diagnostic Studies & Laboratory data:   ECHO: 06/29/2013 Transthoracic Echocardiography  Patient:  Shiniqua, [place]roseclose MR #:    [id number] Study Date: 06/29/2013 Gender:   F Age:    31 Height:   154.9cm Weight:   84.8kg BSA:     1.37m^2 Pt. Status: Room:  ATTENDING  Jimmy Picket SONOGRAPHER Marygrace Drought, RCS PERFORMING  [place] cc:  ------------------------------------------------------------ LV EF: 55% -  65%  ------------------------------------------------------------ Indications:   424.1 Aortic valve disorders.  ------------------------------------------------------------ History:  PMH: Hyperlipidemia Chest pain. Dyspnea. Risk factors: Hypertension.  ------------------------------------------------------------ Study Conclusions  - Left ventricle: The cavity size was normal. Wall thickness was normal. Systolic function was normal. The estimated ejection fraction was in the range of 55% to 65%. Wall motion was normal; there were no regional wall motion abnormalities. Left ventricular diastolic function parameters were normal. - Aortic valve: Trileaflet; mildly thickened leaflets. Mild regurgitation. - Mitral valve: Systolic bowing without prolapse. Mild regurgitation. Valve area by pressure half-time: 1.9cm^2. - Atrial septum: No defect or patent foramen ovale was identified. - Tricuspid valve: Mild regurgitation. Transthoracic echocardiography. M-mode, complete 2D, spectral Doppler, and color Doppler. Height: Height: 154.9cm. Height: 61in. Weight: Weight: 84.8kg. Weight: 186.6lb. Body mass index: BMI: 35.3kg/m^2. Body surface area:  BSA: 1.83m^2. Blood pressure:   134/80. Patient status: Outpatient. Location: Echo laboratory.  ------------------------------------------------------------  ------------------------------------------------------------ Left ventricle: The cavity size was normal. Wall thickness was normal. Systolic function was normal. The estimated ejection fraction was in the range of 55% to 65%. Wall motion was normal; there were no regional wall  motion abnormalities. The transmitral flow pattern was normal. The deceleration time of the early transmitral flow velocity was normal. The pulmonary vein flow pattern was normal. The tissue Doppler parameters were normal. Left ventricular diastolic function parameters were normal.  ------------------------------------------------------------ Aortic valve:  Trileaflet; mildly thickened leaflets. Doppler:  There was no stenosis.  Mild regurgitation. VTI ratio of LVOT to aortic valve: 0.54. Peak velocity ratio of LVOT to aortic valve: 0.53.  Mean gradient: 51mm Hg (S). Peak gradient: 34mm Hg (S).  ------------------------------------------------------------ Mitral valve:  Mildly thickened leaflets . Systolic bowing without prolapse. Systolic bowing without prolapse. Doppler:  Mild  regurgitation.  Valve area by pressure half-time: 1.9cm^2. Indexed valve area by pressure half-time: 1.03cm^2/m^2.  Mean gradient: 24mm Hg (D). Peak gradient: 56mm Hg (D).  ------------------------------------------------------------ Left atrium: LA volume/ BSA = 25.87ml/m2 The atrium was normal in size.  ------------------------------------------------------------ Atrial septum: No defect or patent foramen ovale was identified.  ------------------------------------------------------------ Right ventricle: The cavity size was normal. Systolic function was normal. Systolic pressure was within the normal range.  ------------------------------------------------------------ Pulmonic valve:  Doppler:  Trivial regurgitation.  ------------------------------------------------------------ Tricuspid valve:  Doppler:  Mild regurgitation.  ------------------------------------------------------------ Pulmonary artery:  The main pulmonary artery was normal-sized.  ------------------------------------------------------------ Right atrium: The atrium was normal in  size.  ------------------------------------------------------------ Pericardium: There was no pericardial effusion.  ------------------------------------------------------------ Systemic veins: Inferior vena cava: The vessel was normal in size; the respirophasic diameter changes were in the normal range (= 50%); findings are consistent with normal central venous pressure. Diameter: 47mm.  ------------------------------------------------------------  2D measurements    Normal Doppler measurements  Normal IVC              Main pulmonary Diam     11 mm   ------ artery Left ventricle         Pressure,  25 mm Hg =30 LVID ED,  43.8 mm   43-52  S chord,             Left ventricle PLAX              Ea, lat   11 cm/s  ------ LVID ES,  29.8 mm   23-38  ann, tiss chord,             DP PLAX              E/Ea, lat 7.58    ------ FS, chord,  32 %   >29   ann, tiss PLAX              DP LVPW, ED  9.89 mm   ------ Ea, med   7.5 cm/s  ------ IVS/LVPW   1    <1.3  ann, tiss ratio, ED           DP Ventricular septum       E/Ea, med 11.1    ------ IVS, ED  9.89 mm   ------ ann, tiss   2 Aorta             DP Root diam,  42 mm   ------ LVOT ED               Peak vel,  108 cm/s  ------ Left atrium          S AP dim    39 mm   ------ VTI, S   27.5 cm   ------ AP dim   2.12 cm/m^2 <2.2  Aortic valve index             Peak vel,  204 cm/s  ------ Right ventricle        S RVID ED,  28.7 mm   19-38  Mean vel,  138 cm/s  ------ PLAX              S                VTI, S   51.1 cm   ------                Mean     9 mm Hg ------  gradient,                 S                Peak     17 mm Hg ------                gradient,                S                VTI ratio 0.54    ------                LVOT/AV                Peak vel  0.53    ------                ratio,                LVOT/AV                Regurg PHT 816 ms   ------                Mitral valve                Peak E vel 83.4 cm/s  ------                Peak A vel 82.4 cm/s  ------                Mean vel, 54.6 cm/s  ------                D                Decelerati 190 ms   150-23                on time        0                Pressure  116 ms   ------                half-time                Mean     1 mm Hg ------                gradient,                D                Peak     4 mm Hg ------                gradient,                D                Peak E/A   1    ------                ratio                Area (PHT) 1.9 cm^2  ------                Area index 1.03 cm^2/m ------                (PHT)      ^2  Annulus  40.4 cm   ------                VTI                Tricuspid valve                Regurg   221 cm/s  ------                peak vel                Peak RV-RA  20 mm Hg ------                gradient,                S                Systemic veins                 Estimated   5 mm Hg ------                CVP                Right ventricle                Pressure,  25 mm Hg <30                S                Sa vel,  11.8 cm/s  ------                lat ann,                tiss DP  ------------------------------------------------------------ Prepared and Electronically Authenticated by  Shelva Majestic   [id number]   Recent Radiology Findings:  Ct Angio Chest Aorta W/cm &/or Wo/cm  Result Date: 08/19/2018 CLINICAL DATA:  Followup thoracic aortic aneurysm. EXAM: CT ANGIOGRAPHY CHEST WITH CONTRAST TECHNIQUE: Multidetector CT imaging of the chest was performed using the standard protocol during bolus administration of intravenous contrast. Multiplanar CT image reconstructions and MIPs were obtained to evaluate the vascular anatomy. CONTRAST:  52mL ISOVUE-370 IOPAMIDOL (ISOVUE-370) INJECTION 76% Creatinine was obtained on site at [facility] at [address] Results: Creatinine 1.1 mg/dL. COMPARISON:  08/20/2017 FINDINGS: Cardiovascular: Heart size appears within normal limits. No pericardial effusion identified. The ascending thoracic aorta measures 4.2 cm and maximum diameter, image 56/5. This is unchanged from the previous exam. The transverse aortic arch measures up to 2.8 cm. At the level of the diaphragmatic hiatus the descending thoracic aorta measures 2.0 cm. No evidence for aortic dissection. Mediastinum/Nodes: Normal appearance of the thyroid gland. The trachea appears patent and is midline. Normal appearance of the esophagus. No enlarged supraclavicular, axillary, mediastinal, or hilar adenopathy. Lungs/Pleura: No pleural effusion. Similar appearance of ground-glass opacities within both lungs with a lower lobe predominance. Calcified granuloma identified within the left upper lobe. Small pulmonary  nodule within the posterior aspect of the right midlung measures 3 mm, image 40/7. Unchanged from previous exam. Upper Abdomen: No acute abnormality noted within the upper abdomen. Musculoskeletal: Spondylosis noted within the thoracic spine. Review of the MIP images confirms the above findings. IMPRESSION: 1. Stable ascending thoracic aortic aneurysm measuring 4.2 cm maximum diameter. Recommend annual imaging followup by CTA or MRA. This recommendation follows 2010 ACCF/AHA/AATS/ACR/ASA/SCA/SCAI/SIR/STS/SVM Guidelines for the Diagnosis and Management of Patients with Thoracic Aortic Disease. Circulation. 2010; 121:  [phone] 2. 3 mm right lung nodule is unchanged from previous exam compatible with a benign abnormality. Electronically Signed   By: Kerby Moors M.D.   On: 08/19/2018 12:03   I have independently reviewed the above radiology studies  and reviewed the findings with the patient.   Ct Angio Chest Aorta W &/or Wo Contrast  Result Date: 07/24/2016 CLINICAL DATA:  Thoracic aortic aneurysm fall. Nonsmoker. History of right breast cancer with partial mastectomy. No chest complaints. EXAM: CT ANGIOGRAPHY CHEST WITH CONTRAST TECHNIQUE: Multidetector CT imaging of the chest was performed using the standard protocol during bolus administration of intravenous contrast. Multiplanar CT image reconstructions and MIPs were obtained to evaluate the vascular anatomy. CONTRAST:  75 cc of Isovue 370 IV COMPARISON:  06/29/2014 CT FINDINGS: Cardiovascular: There has been no significant interval change in the appearance of the fusiform ascending thoracic aortic aneurysm. Greatest ascending aortic dimension is 4.3 cm and along the arch, 3.3 cm. No descending thoracic aortic aneurysm. There is no aortic dissection, wall thickening or evidence of leak. Normal heart size. No pericardial effusion. Great vessels are unremarkable with normal takeoff anatomy. Mediastinum/Nodes: No enlarged mediastinal, hilar, or axillary  lymph nodes. Thyroid gland, trachea, and esophagus demonstrate no significant findings. Lungs/Pleura: Bibasilar dependent atelectasis. No pneumothorax or effusion. Minimal scarring in the right middle lobe versus atelectasis. No suspicious nodules or pulmonary masses. Upper Abdomen: Stable incidental hepatic cysts. Postsurgical changes right breast. 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 No suspicious osseous lesions. Review of the MIP images confirms the above findings. IMPRESSION: Stable fusiform ascending aortic aneurysm measuring up to 4.3 cm maximum, stable since 2015. No acute pulmonary abnormality. Electronically Signed   By: Ashley Royalty M.D.   On: 07/24/2016 14:51       06/06/2013   CLINICAL DATA:  Neck pain with right-sided radicular symptoms  EXAM: CERVICAL SPINE  4+ VIEWS  COMPARISON:  None.  FINDINGS: Frontal, lateral, open-mouth odontoid, and bilateral oblique views were obtained. There is no fracture or spondylolisthesis. Prevertebral soft tissues and predental space regions are normal.  There is fairly marked disc space narrowing at C4-5. There is moderate disc space narrowing at C7-T1. There is mild disc space narrowing at C3-4. There is the exit foraminal narrowing at C4-5 due to the facet hypertrophy.  There is a small cervical rib on the right.  IMPRESSION: Multilevel osteoarthritic change. No fracture or spondylolisthesis. Small cervical rib on the right.   Electronically Signed   By: Lowella Grip M.D.   On: 06/06/2013 21:19   Dg Thoracic Spine W/swimmers  06/06/2013   CLINICAL DATA:  Pain with radicular symptoms  EXAM: THORACIC SPINE - 2 VIEW + SWIMMERS  COMPARISON:  None.  FINDINGS: Frontal, lateral, and swimmer's views were obtained. There is no fracture or spondylolisthesis. There the is multilevel disc space narrowing with several small osteophytes consistent with osteoarthritic change. No erosive change.  IMPRESSION: Multilevel osteoarthritic  change. No fracture or spondylolisthesis.   Electronically Signed   By: Lowella Grip M.D.   On: 06/06/2013 21:20   Ct Angio Chest Aorta W/cm &/or Wo/cm  06/09/2013   CLINICAL DATA:  Aneurysm  EXAM: CT ANGIOGRAPHY CHEST WITH CONTRAST  TECHNIQUE: Multidetector CT imaging of the chest was performed using the standard protocol during bolus administration of intravenous contrast. Multiplanar CT image reconstructions including MIPs were obtained to evaluate the vascular anatomy.  CONTRAST:  170mL OMNIPAQUE IOHEXOL 350 MG/ML SOLN  COMPARISON:  09/02/2010  FINDINGS: Maximal aortic  diameter at the sinus of Valsalva, sino-tubular junction, and ascending aorta are 3.4 cm, 2.9 cm, and 4.5 cm respectively. Previously, the maximal diameter in the ascending aorta was 4.2 cm. No evidence of dissection.  Origins of the great vessels are patent. Right subclavian and common carotid arteries are patent. Visualized vertebral arteries are patent.  No obvious filling defect in the pulmonary arterial tree to suggest acute pulmonary thromboembolism.  No abnormal mediastinal adenopathy or pericardial effusion.  Scattered linear atelectasis in the lungs without consolidation or mass.  Degenerative disc disease within the thoracic spine. No compression deformity. Stable liver cyst.  Review of the MIP images confirms the above findings.  IMPRESSION: Ascending aortic aneurysm has increased from 4.2 cm to 4.5 cm in maximal diameter.   Electronically Signed   By: Maryclare Bean M.D.   On: 06/09/2013 07:39    ECHO: 03/30/2008: SUMMARY - Overall left ventricular systolic function was normal. There were no left ventricular regional wall motion abnormalities. There was moderate asymmetric septal hypertrophy. - The AV leaflets themselves appear normal with normal opening. However, there are increased velocities and gradients. I do not appreciate a subvalvular membrane and there does not appear to be LVOT obstruction from ASH. Consider TEE  or cardiac CT if clinically indicated for enhanced visualization. The mean transaortic valve gradient was 11 mmHg. Estimated aortic valve area (by VTI) was 1.74 cm^2. Estimated aortic valve area (by Vmax) was 1.72 cm^2. - There appears to be a linear density in the prox asc aorta without definitive color flow appreciated on doppler in this area. This is not seen in all views. Additionally, the prox asc aorta is mild to mod dilated in this region. Recommend TEE or cardiac/chest CT to eval for poss aneurysm and/or dissection if clinically indicated. There was moderate aortic arch dilatation. - There was mild mitral valvular regurgitation. The effective orifice of mitral regurgitation by proximal isovelocity surface area was 0.21 cm^2. The volume of mitral regurgitation by proximal isovelocity surface area was 26 cc. - The estimated peak right ventricular systolic pressure was mildly increased.   Recent Lab Findings: Lab Results  Component Value Date   WBC 4.1 02/08/2018   HGB 11.7 02/08/2018   HCT 34.6 (L) 02/08/2018   PLT 313 02/08/2018   GLUCOSE 102 (H) 05/11/2018   CHOL 159 05/11/2018   TRIG 45 05/11/2018   HDL 81 05/11/2018   LDLCALC 69 05/11/2018   ALT 14 05/11/2018   AST 28 05/11/2018   NA 144 05/11/2018   K 3.5 05/11/2018   CL 99 05/11/2018   CREATININE 1.05 (H) 05/11/2018   BUN 17 05/11/2018   CO2 28 05/11/2018   TSH 4.13 10/26/2017   HGBA1C 5.6 05/27/2014   Chronic Kidney Disease   Stage I     GFR >90  Stage II    GFR 60-89  Stage IIIA GFR 45-59  Stage IIIB GFR 30-44  Stage IV   GFR 15-29  Stage V    GFR  <15  Lab Results  Component Value Date   CREATININE 1.05 (H) 05/11/2018   CrCl cannot be calculated (Patient's most recent lab result is older than the maximum 21 days allowed.).  Aortic Size Index=   4.3    /Body surface area is 1.9 meters squared. =2.33  < 2.75 cm/m2      4% risk per year 2.75 to 4.25          8% risk per year > 4.25 cm/m2  20%  risk per year  Aortic cross sectionarea/height= 9.2  Assessment / Plan:  #1 stable fusiform ascending aortic aneurysm measuring 4.3 cm stable since 2009 #2 trileaflet aortic valve by echocardiogram in 2014 #3 history of hypertension  Consider follow-up echocardiogram with murmur consistent with aortic stenosis  Plan to see the patient back in 1 year with a follow-up CTA of the chest Avoiding strenuous lifting and avoiding quinolones has been discussed with the patient Keep blood pressure controlled  Grace Isaac MD      [address] [place] [address] Office [phone]   Beeper [phone]

## 2018-08-23 ENCOUNTER — Telehealth: Payer: Self-pay | Admitting: *Deleted

## 2018-08-23 DIAGNOSIS — R011 Cardiac murmur, unspecified: Secondary | ICD-10-CM

## 2018-08-23 NOTE — Telephone Encounter (Signed)
Echo orders placed per Dr. [name]. Message sent to scheduling for an appointment.

## 2018-08-23 NOTE — Progress Notes (Signed)
Thanks, Ed. Lattie Haw, can you please schedule for a non-urgent echo for murmur? Thanks [employer]

## 2018-08-24 ENCOUNTER — Encounter: Payer: Self-pay | Admitting: Family Medicine

## 2018-08-26 ENCOUNTER — Ambulatory Visit (HOSPITAL_COMMUNITY)
Admission: RE | Admit: 2018-08-26 | Discharge: 2018-08-26 | Disposition: A | Discharge status: Home / Self Care | Payer: PPO | Source: Ambulatory Visit | Attending: Cardiovascular Disease | Admitting: Cardiovascular Disease

## 2018-08-26 DIAGNOSIS — R011 Cardiac murmur, unspecified: Secondary | ICD-10-CM | POA: Insufficient documentation

## 2018-08-26 NOTE — Progress Notes (Signed)
*  PRELIMINARY RESULTS* Echocardiogram 2D Echocardiogram has been performed.  Desiree [name] 08/26/2018, 1:33 PM

## 2018-09-02 ENCOUNTER — Ambulatory Visit (INDEPENDENT_AMBULATORY_CARE_PROVIDER_SITE_OTHER): Payer: PPO | Admitting: Orthopaedic Surgery

## 2018-09-02 ENCOUNTER — Encounter (INDEPENDENT_AMBULATORY_CARE_PROVIDER_SITE_OTHER): Payer: Self-pay | Admitting: Orthopaedic Surgery

## 2018-09-02 VITALS — BP 125/73 | HR 67 | Ht 61.5 in | Wt 183.0 lb

## 2018-09-02 DIAGNOSIS — M1711 Unilateral primary osteoarthritis, right knee: Secondary | ICD-10-CM

## 2018-09-02 NOTE — Progress Notes (Signed)
Office Visit Note   Patient: Desiree [name]           Date of Birth: 1946-03-14           MRN: [id number] Visit Date: 09/02/2018              Requested by: [name], [address], [place], [place] [address] PCP: [name], MD   Assessment & Plan: Visit Diagnoses:  1. Arthritis of knee, right     Plan: Patient's injection October 2019 still doing well.  She can return if she has increased knee pain.  She occasionally can use Aleve/Tylenol for symptoms.  X-rays were reviewed with once again on today's visit.  Follow-Up Instructions: Return if symptoms worsen or fail to improve.   Orders:  No orders of the defined types were placed in this encounter.  No orders of the defined types were placed in this encounter.     Procedures: No procedures performed   Clinical Data: No additional findings.   Subjective: Chief Complaint  Patient presents with  . Right Knee - Follow-up    HPI 73 year old female returns for follow-up post injection right knee 2 months ago for her osteoarthritis.  She went through therapy which is improved her pain she is walking better she still has popping but it does not prevent her from ambulating places she wants to go in the community.  X-rays were reviewed again today on her visit.  She states she sleeps well pain does not wake her up at night.  Review of Systems 14 point update unchanged from 05/06/2018 office visit other than as mentioned in HPI.   Objective: Vital Signs: BP 125/73   Pulse 67   Ht 5' 1.5" (1.562 m)   Wt 183 lb (83 kg)   BMI 34.02 kg/m   Physical Exam Constitutional:      Appearance: She is well-developed.  HENT:     Head: Normocephalic.     Right Ear: External ear normal.     Left Ear: External ear normal.  Eyes:     Pupils: Pupils are equal, round, and reactive to light.  Neck:     Thyroid: No thyromegaly.     Trachea: No tracheal deviation.  Cardiovascular:     Rate and  Rhythm: Normal rate.  Pulmonary:     Effort: Pulmonary effort is normal.  Abdominal:     Palpations: Abdomen is soft.  Skin:    General: Skin is warm and dry.  Neurological:     Mental Status: She is alert and oriented to person, place, and time.  Psychiatric:        Behavior: Behavior normal.     Ortho Exam patient has bilateral knee crepitus.  Pulses are intact negative logroll to the hips.  Knee flexes past 110 degrees.  Collateral ligament are stable.  Palpable osteophytes mildly tender.  Normal heel toe gait without knee limp.  No plantar foot lesions.  Specialty Comments:  No specialty comments available.  Imaging: No results found.   PMFS History: Patient Active Problem List   Diagnosis Date Noted  . Fall at home, subsequent encounter 06/07/2018  . Knee pain, right 05/31/2017  . CKD (chronic kidney disease) stage 3, GFR 30-59 ml/min (HCC) 10/01/2016  . 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 Urinary incontinence 05/22/2014  . Metabolic syndrome X 06/18/8526  . Osteopenia 12/12/2013  . Anemia 11/29/2013  . Arthritis of knee, right 02/21/2013  .  Obesity (BMI 30-39.9) 10/17/2012  . Thoracic aortic aneurysm ([place]) 10/12/2012  . Allergic rhinitis 03/02/2012  . BLADDER PROLAPSE 09/24/2010  . NEOPLASM, MALIGNANT, BREAST, RIGHT 02/04/2008  . Hyperlipidemia LDL goal <100 02/04/2008  . Essential hypertension 02/04/2008   Past Medical History:  Diagnosis Date  . Allergic rhinitis   . Arthritis HIPS/ WRISTS  . DDD (degenerative disc disease), cervical   . GERD (gastroesophageal reflux disease) OCCASIONAL  . Heart murmur ASYMPTOMATIC  . History of Bell's palsy 2011  RIGHT SIDE -- RESOLVED  . 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 History of CVA (cerebrovascular accident) PER SCAN IN 2011  . Hyperlipidemia   . Hypertension   . Impaired fasting glucose PER PCP  DR SIMPSON   WATCH  DIET  . Mixed urge and stress incontinence   . Nocturia   . Sinus drainage   . Thoracic ascending aortic aneurysm ([place]) LAST CHEST CT 09-02-2010--  FOLLOWED BY  CARDIOLOGIST--  DR NOBSJGGE  . Vaginal wall prolapse     Family History  Problem Relation Age of Onset  . Kidney failure Mother   . Hypertension Mother   . Heart attack Mother 27  . Throat cancer Father   . Prostate cancer Father   . Lung cancer Father 16  . Breast cancer Sister   . Hypertension Brother        2 stents  . Colon cancer Neg Hx   . Colon polyps Neg Hx     Past Surgical History:  Procedure Laterality Date  . BILATERAL CARPAL TUNNEL RELEASE  1980'S  . CARDIAC CATHETERIZATION  02-01-2009  DR EICHHORN   NORMAL CORONARY ARTERIES/ NORMAL LV SIZE AND FUNCTION/ AORTIC ROOT SIZE AT THE UPPER LIMIT OF NORMAL   . CARPAL TUNNEL RELEASE    . CERVICAL DISC SURGERY  12-12-2005  DR Vertell Limber   LEFT  C6 - C7 HERINATED / DDD/ SPONDYLOSIS  . COLONOSCOPY N/A 02/10/2014   Procedure: COLONOSCOPY;  Surgeon: Danie Binder, MD;  [facility];  Service: Endoscopy;  Laterality: N/A;  8:30  . CYSTOCELE REPAIR  08/02/2012   Procedure: ANTERIOR REPAIR (CYSTOCELE);  Surgeon: Ailene Rud, MD;  Location: [facility];  Service: Urology;  Laterality: N/A;  [employer] Uphold Anterior Pelvic Floor Sacrospinus Repair. Anterior wall of the vagina.  . ESOPHAGOGASTRODUODENOSCOPY N/A 02/10/2014   Procedure: ESOPHAGOGASTRODUODENOSCOPY (EGD);  Surgeon: Danie Binder, MD;  Location[facility];  Service: Endoscopy;  Laterality: N/A;  . GIVENS CAPSULE STUDY N/A 03/13/2014   Procedure: GIVENS CAPSULE STUDY;  Surgeon: Danie Binder, MD;  Location: [facility];  Service: Endoscopy;  Laterality: N/A;  7:30  . NECK SURGERY  2007  . RIGHT PARTIAL MASTECTOMY  11-11-2004  DR [name] YOUNG   DUCTAL CARCINOMA IN SITU RIGHT BREAST  . TOTAL ABDOMINAL HYSTERECTOMY W/ BILATERAL SALPINGOOPHORECTOMY  1990  . TRANSTHORACIC  ECHOCARDIOGRAM  03-30-2008  DR MARGARET SIMPSON   LV SIZE AND FUNCTION NORMAL/ MODERATE AORTIC ARCH DILATATION/ MILD MR   Social History   Occupational History  . Not on file  Tobacco Use  . Smoking status: Never Smoker  . Smokeless tobacco: Never Used  Substance and Sexual Activity  . Alcohol use: No  . Drug use: No  . Sexual activity: Not Currently

## 2018-09-07 ENCOUNTER — Telehealth: Payer: Self-pay | Admitting: Cardiovascular Disease

## 2018-09-07 NOTE — Telephone Encounter (Signed)
New message   Patient would like to get the results of her echocardiogram. Please call.

## 2018-09-07 NOTE — Telephone Encounter (Signed)
Pt aware of echo results with verbal understanding. Notes recorded by [name], MD on 08/27/2018 at 1:26 PM EST Despite the fact that the murmur is louder, no evidence of change in the aortic valve compared to previous echo. No aortic stenosis. Still has excellent heart pumping strength. Pt voiced appreciation for the call.

## 2018-09-21 ENCOUNTER — Telehealth: Payer: Self-pay | Admitting: Family Medicine

## 2018-09-21 NOTE — Telephone Encounter (Signed)
Pt is calling would like for Dr Moshe Cipro to call her in some Ibrupfen.

## 2018-09-22 ENCOUNTER — Other Ambulatory Visit: Payer: Self-pay

## 2018-09-22 MED ORDER — IBUPROFEN 800 MG PO TABS: 800.0000 mg | ORAL_TABLET | Freq: Three times a day (TID) | ORAL | 0 refills | Status: DC

## 2018-09-22 NOTE — Telephone Encounter (Signed)
1 refill sent in

## 2018-10-05 ENCOUNTER — Telehealth: Payer: Self-pay | Admitting: Family Medicine

## 2018-10-05 ENCOUNTER — Ambulatory Visit (INDEPENDENT_AMBULATORY_CARE_PROVIDER_SITE_OTHER): Payer: PPO | Admitting: Family Medicine

## 2018-10-05 ENCOUNTER — Encounter: Payer: Self-pay | Admitting: Family Medicine

## 2018-10-05 VITALS — BP 120/70 | HR 56 | Resp 14 | Ht 61.5 in | Wt 185.0 lb

## 2018-10-05 DIAGNOSIS — Z0000 Encounter for general adult medical examination without abnormal findings: Secondary | ICD-10-CM | POA: Diagnosis not present

## 2018-10-05 DIAGNOSIS — E785 Hyperlipidemia, unspecified: Secondary | ICD-10-CM

## 2018-10-05 DIAGNOSIS — I10 Essential (primary) hypertension: Secondary | ICD-10-CM

## 2018-10-05 DIAGNOSIS — E8881 Metabolic syndrome: Secondary | ICD-10-CM

## 2018-10-05 DIAGNOSIS — M67432 Ganglion, left wrist: Secondary | ICD-10-CM | POA: Insufficient documentation

## 2018-10-05 DIAGNOSIS — Z1231 Encounter for screening mammogram for malignant neoplasm of breast: Secondary | ICD-10-CM

## 2018-10-05 NOTE — Assessment & Plan Note (Signed)
3 day h/o painful swelling refer to Ortho

## 2018-10-05 NOTE — Telephone Encounter (Signed)
Call to schedule mammogram for any day --in the afternoon, after 2pm.   Call and leave on answer machine at home.  On hold 15 mins with Forestine Na

## 2018-10-05 NOTE — Progress Notes (Signed)
    Desiree [name]     MRN: [id number]      DOB: April 20, 1946  HPI: Patient is in for annual physical exam. Pain ful swelling on left wrist x 3 days, no inciting trauma.    PE:  BP 120/70   Pulse (!) 56   Resp 14   Ht 5' 1.5" (1.562 m)   Wt 185 lb (83.9 kg)   SpO2 98%   BMI 34.39 kg/m   Pleasant  female, alert and oriented x 3, in no cardio-pulmonary distress. Afebrile. HEENT No facial trauma or asymetry. Sinuses non tender.  Extra occullar muscles intact, pupils equally reactive to light. External ears normal, tympanic membranes clear. Oropharynx moist, no exudate. Neck: supple, no adenopathy,JVD or thyromegaly.No bruits.  Chest: Clear to ascultation bilaterally.No crackles or wheezes. Non tender to palpation  Breast: No asymetry,no masses or lumps. No tenderness. No nipple discharge or inversion. No axillary or supraclavicular adenopathy  Cardiovascular system; Heart sounds normal,  S1 and  S2 ,no S3. Systolic  murmur, or thrill. Apical beat not displaced Peripheral pulses normal.  Abdomen: Soft, non tender, no organomegaly or masses. No bruits. Bowel sounds normal. No guarding, tenderness or rebound.     Musculoskeletal exam: Full ROM of spine, hips , shoulders and reduced in left  Knee with crepitus in the knee. No deformity ,swelling or crepitus noted. No muscle wasting or atrophy.  Tender ganglion cyst on left wrist  Neurologic: Cranial nerves 2 to 12 intact. Power, tone ,sensation and reflexes normal throughout. No disturbance in gait. No tremor.  Skin: Intact, no ulceration, erythema , scaling or rash noted. Pigmentation normal throughout  Psych; Normal mood and affect. Judgement and concentration normal   Assessment & Plan:  Ganglion cyst of volar aspect of left wrist 3 day h/o painful swelling refer to Ortho  Annual physical exam Annual exam as documented. Counseling done  re healthy lifestyle involving commitment to 150 minutes  exercise per week, heart healthy diet, and attaining healthy weight.The importance of adequate sleep also discussed. Immunization and cancer screening needs are specifically addressed at this visit.

## 2018-10-05 NOTE — Patient Instructions (Addendum)
MD follow up end August , call if you need me before  Please schedule mammogram at checkout ( pt request)   You are referred to Orthopedics in [place] re tender cyst on left wrist  PLEASE be careful about balance and falls since your left knee is unsteady  Fasting CBC, lipid, cmp and EGFr and [facility] March 4 or shortly after  It is important that you exercise regularly at least 30 minutes 5 times a week. If you develop chest pain, have severe difficulty breathing, or feel very tired, stop exercising immediately and seek medical attention    Thank you  for choosing [facility]. We consider it a privelige to serve you.  Delivering excellent health care in a caring and  compassionate way is our goal.  Partnering with you,  so that together we can achieve this goal is our strategy.

## 2018-10-06 ENCOUNTER — Encounter: Payer: Self-pay | Admitting: Family Medicine

## 2018-10-06 NOTE — Assessment & Plan Note (Signed)
Annual exam as documented. Counseling done  re healthy lifestyle involving commitment to 150 minutes exercise per week, heart healthy diet, and attaining healthy weight.The importance of adequate sleep also discussed.  Immunization and cancer screening needs are specifically addressed at this visit.  

## 2018-10-11 NOTE — Telephone Encounter (Signed)
Pt is scheduled Desiree [name] Mammo June 1st @ 2:15---LVM to Advise pt

## 2018-10-14 ENCOUNTER — Ambulatory Visit (INDEPENDENT_AMBULATORY_CARE_PROVIDER_SITE_OTHER): Payer: PPO | Admitting: Orthopaedic Surgery

## 2018-10-23 ENCOUNTER — Other Ambulatory Visit: Payer: Self-pay | Admitting: Family Medicine

## 2018-10-23 DIAGNOSIS — E785 Hyperlipidemia, unspecified: Secondary | ICD-10-CM | POA: Diagnosis not present

## 2018-10-23 DIAGNOSIS — I10 Essential (primary) hypertension: Secondary | ICD-10-CM | POA: Diagnosis not present

## 2018-10-25 LAB — CBC/DIFF AMBIGUOUS DEFAULT
BASOS ABS: 0 10*3/uL (ref 0.0–0.2)
Basos: 1 %
EOS (ABSOLUTE): 0.1 10*3/uL (ref 0.0–0.4)
EOS: 4 %
HEMATOCRIT: 36 % (ref 34.0–46.6)
HEMOGLOBIN: 11.8 g/dL (ref 11.1–15.9)
IMMATURE GRANS (ABS): 0 10*3/uL (ref 0.0–0.1)
IMMATURE GRANULOCYTES: 0 %
LYMPHS: 30 %
Lymphocytes Absolute: 1 10*3/uL (ref 0.7–3.1)
MCH: 28.8 pg (ref 26.6–33.0)
MCHC: 32.8 g/dL (ref 31.5–35.7)
MCV: 88 fL (ref 79–97)
MONOCYTES: 6 %
Monocytes Absolute: 0.2 10*3/uL (ref 0.1–0.9)
NEUTROS PCT: 59 %
Neutrophils Absolute: 1.9 10*3/uL (ref 1.4–7.0)
Platelets: 313 10*3/uL (ref 150–450)
RBC: 4.1 x10E6/uL (ref 3.77–5.28)
RDW: 13.3 % (ref 11.7–15.4)
WBC: 3.2 10*3/uL — AB (ref 3.4–10.8)

## 2018-10-25 LAB — LIPID PANEL W/O CHOL/HDL RATIO
CHOLESTEROL TOTAL: 186 mg/dL (ref 100–199)
HDL: 86 mg/dL (ref >39)
LDL CALC: 93 mg/dL (ref 0–99)
TRIGLYCERIDES: 37 mg/dL (ref 0–149)
VLDL CHOLESTEROL CAL: 7 mg/dL (ref 5–40)

## 2018-10-25 LAB — COMPREHENSIVE METABOLIC PANEL
ALBUMIN: 4.1 g/dL (ref 3.7–4.7)
ALK PHOS: 84 IU/L (ref 39–117)
ALT: 20 IU/L (ref 0–32)
AST: 25 IU/L (ref 0–40)
Albumin/Globulin Ratio: 1.5 (ref 1.2–2.2)
BUN / CREAT RATIO: 15 (ref 12–28)
BUN: 18 mg/dL (ref 8–27)
Bilirubin Total: 0.5 mg/dL (ref 0.0–1.2)
CALCIUM: 9.6 mg/dL (ref 8.7–10.3)
CO2: 28 mmol/L (ref 20–29)
CREATININE: 1.19 mg/dL — AB (ref 0.57–1.00)
Chloride: 101 mmol/L (ref 96–106)
GFR calc Af Amer: 53 mL/min/{1.73_m2} — ABNORMAL LOW (ref >59)
GFR calc non Af Amer: 46 mL/min/{1.73_m2} — ABNORMAL LOW (ref >59)
GLOBULIN, TOTAL: 2.8 g/dL (ref 1.5–4.5)
Glucose: 93 mg/dL (ref 65–99)
Potassium: 3.8 mmol/L (ref 3.5–5.2)
Sodium: 145 mmol/L — ABNORMAL HIGH (ref 134–144)
Total Protein: 6.9 g/dL (ref 6.0–8.5)

## 2018-10-25 LAB — TSH: TSH: 3.78 u[IU]/mL (ref 0.450–4.500)

## 2018-10-25 LAB — SPECIMEN STATUS REPORT

## 2018-10-27 ENCOUNTER — Telehealth: Payer: Self-pay | Admitting: Family Medicine

## 2018-10-27 ENCOUNTER — Encounter: Payer: Self-pay | Admitting: Family Medicine

## 2018-10-27 ENCOUNTER — Ambulatory Visit (HOSPITAL_COMMUNITY)
Admission: RE | Admit: 2018-10-27 | Discharge: 2018-10-27 | Disposition: A | Discharge status: Home / Self Care | Payer: PPO | Source: Ambulatory Visit | Attending: Family Medicine | Admitting: Family Medicine

## 2018-10-27 ENCOUNTER — Ambulatory Visit (INDEPENDENT_AMBULATORY_CARE_PROVIDER_SITE_OTHER): Payer: PPO | Admitting: Family Medicine

## 2018-10-27 VITALS — BP 120/82 | HR 62 | Temp 98.9°F | Resp 15 | Ht 61.5 in | Wt 187.0 lb

## 2018-10-27 DIAGNOSIS — J209 Acute bronchitis, unspecified: Secondary | ICD-10-CM | POA: Insufficient documentation

## 2018-10-27 DIAGNOSIS — I10 Essential (primary) hypertension: Secondary | ICD-10-CM | POA: Diagnosis not present

## 2018-10-27 DIAGNOSIS — R05 Cough: Secondary | ICD-10-CM | POA: Diagnosis not present

## 2018-10-27 MED ORDER — PROMETHAZINE-PHENYLEPHRINE 6.25-5 MG/5ML PO SYRP: ORAL_SOLUTION | ORAL | 0 refills | Status: DC

## 2018-10-27 MED ORDER — PENICILLIN V POTASSIUM 500 MG PO TABS: 500.0000 mg | ORAL_TABLET | Freq: Three times a day (TID) | ORAL | 0 refills | Status: DC

## 2018-10-27 MED ORDER — BENZONATATE 100 MG PO CAPS: 100.0000 mg | ORAL_CAPSULE | Freq: Two times a day (BID) | ORAL | 0 refills | Status: DC | PRN

## 2018-10-27 NOTE — Patient Instructions (Addendum)
F/u as before, call if you need me sooner.  You are treated for acute bronchitis, 3 medica cations are at your pharmacy  Please get CXR today  Work excuse from Today return 11/01/2018   Acute Bronchitis, Adult Acute bronchitis is when air tubes (bronchi) in the lungs suddenly get swollen. The condition can make it hard to breathe. It can also cause these symptoms:  A cough.  Coughing up clear, yellow, or green mucus.  Wheezing.  Chest congestion.  Shortness of breath.  A fever.  Body aches.  Chills.  A sore throat. Follow these instructions at home:  Medicines  Take over-the-counter and prescription medicines only as told by your doctor.  If you were prescribed an antibiotic medicine, take it as told by your doctor. Do not stop taking the antibiotic even if you start to feel better. General instructions  Rest.  Drink enough fluids to keep your pee (urine) pale yellow.  Avoid smoking and secondhand smoke. If you smoke and you need help quitting, ask your doctor. Quitting will help your lungs heal faster.  Use an inhaler, cool mist vaporizer, or humidifier as told by your doctor.  Keep all follow-up visits as told by your doctor. This is important. How is this prevented? To lower your risk of getting this condition again:  Wash your hands often with soap and water. If you cannot use soap and water, use hand sanitizer.  Avoid contact with people who have cold symptoms.  Try not to touch your hands to your mouth, nose, or eyes.  Make sure to get the flu shot every year. Contact a doctor if:  Your symptoms do not get better in 2 weeks. Get help right away if:  You cough up blood.  You have chest pain.  You have very bad shortness of breath.  You become dehydrated.  You faint (pass out) or keep feeling like you are going to pass out.  You keep throwing up (vomiting).  You have a very bad headache.  Your fever or chills gets worse. This  information is not intended to replace advice given to you by your health care provider. Make sure you discuss any questions you have with your health care provider. Document Released: 01/28/2008 Document Revised: 03/25/2017 Document Reviewed: 01/30/2016 Elsevier Interactive Patient Education  2019 [employer].

## 2018-10-30 ENCOUNTER — Encounter: Payer: Self-pay | Admitting: Family Medicine

## 2018-10-30 NOTE — Assessment & Plan Note (Signed)
Antibiotic, decongestant and cough suppressant are prescribed CXR and work excuse to return 11/01/2018

## 2018-10-30 NOTE — Progress Notes (Signed)
   Desiree [name]     MRN: [id number]      DOB: 16-Jul-1946   HPI Ms. 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  Denies chest pains, palpitations and leg swelling Denies abdominal pain, nausea, vomiting,diarrhea or constipation.   Denies dysuria, frequency, hesitancy or incontinence. Denies joint pain, swelling and limitation in mobility. Denies headaches, seizures, numbness, or tingling. Denies depression, anxiety or insomnia. Denies skin break down or rash.   PE  BP 120/82   Pulse 62   Temp 98.9 F (37.2 C) (Oral)   Resp 15   Ht 5' 1.5" (1.562 m)   Wt 187 lb (84.8 kg)   SpO2 96%   BMI 34.76 kg/m   Patient alert and oriented and in no cardiopulmonary distress.Acutely ill appearing  HEENT: No facial asymmetry, EOMI,   oropharynx pink and moist.  Neck supple no JVD, no mass.  Chest: decreased air entry, scattered crackles, no wheezes CVS: S1, S2 no murmurs, no S3.Regular rate.  ABD: Soft non tender.   Ext: No edema  MS: Adequate ROM spine, shoulders, hips and knees.  Skin: Intact, no ulcerations or rash noted.  Psych: Good eye contact, normal affect. Memory intact not anxious or depressed appearing.  CNS: CN 2-12 intact, power,  normal throughout.no focal deficits noted.   Assessment & Plan  Acute bronchitis Antibiotic, decongestant and cough suppressant are prescribed CXR and work excuse to return 11/01/2018  Essential hypertension Controlled, no change in medication DASH diet and commitment to daily physical activity for a minimum of 30 minutes discussed and encouraged, as a part of hypertension management. The importance of attaining a healthy weight is also discussed.  BP/Weight 10/27/2018 10/05/2018 09/02/2018 08/19/2018 07/08/2018  06/14/2018 03/05/1974  Systolic BP 883 254 982 641 583 094 076  Diastolic BP 82 70 73 64 65 83 81  Wt. (Lbs) 187 185 183 183.6 184 180 184  BMI 34.76 34.39 34.02 34.13 34.2 34.01 34.2

## 2018-10-30 NOTE — Assessment & Plan Note (Signed)
Controlled, no change in medication DASH diet and commitment to daily physical activity for a minimum of 30 minutes discussed and encouraged, as a part of hypertension management. The importance of attaining a healthy weight is also discussed.  BP/Weight 10/27/2018 10/05/2018 09/02/2018 08/19/2018 07/08/2018 06/14/2018 13/64/3837  Systolic BP 793 968 864 847 207 218 288  Diastolic BP 82 70 73 64 65 83 81  Wt. (Lbs) 187 185 183 183.6 184 180 184  BMI 34.76 34.39 34.02 34.13 34.2 34.01 34.2

## 2018-11-03 ENCOUNTER — Other Ambulatory Visit: Payer: Self-pay | Admitting: Family Medicine

## 2018-11-17 ENCOUNTER — Other Ambulatory Visit: Payer: Self-pay

## 2018-11-17 ENCOUNTER — Encounter: Payer: Self-pay | Admitting: Family Medicine

## 2018-11-17 ENCOUNTER — Telehealth (INDEPENDENT_AMBULATORY_CARE_PROVIDER_SITE_OTHER): Payer: PPO | Admitting: Family Medicine

## 2018-11-17 DIAGNOSIS — R300 Dysuria: Secondary | ICD-10-CM

## 2018-11-17 MED ORDER — PHENAZOPYRIDINE HCL 200 MG PO TABS: 200.0000 mg | ORAL_TABLET | Freq: Three times a day (TID) | ORAL | 0 refills | Status: AC | PRN

## 2018-11-17 MED ORDER — NITROFURANTOIN MONOHYD MACRO 100 MG PO CAPS: 100.0000 mg | ORAL_CAPSULE | Freq: Two times a day (BID) | ORAL | 0 refills | Status: AC

## 2018-11-17 NOTE — Progress Notes (Signed)
Virtual Visit via Telephone Note  I connected with Desiree [name] on 11/17/18 at 11:00 AM EDT by telephone and verified that I am speaking with the correct person using two identifiers.   I discussed the limitations, risks, security and privacy concerns of performing an evaluation and management service by telephone. I also discussed with the patient that there may be a patient responsible charge related to this service. The patient expressed understanding and agreed to proceed.  Location of patient: Home Location of provider: Office   History of Present Illness: Desiree [name] is a 73 year old female of Dr. Griffin Dakin.  She presents today for a telemedicine visit secondary to having increased worsening of questionable UTI symptoms.  She noticed that she started having some burning and discomfort with urination on Saturday the 21st.  She reports that it is been ongoing since then.  Burning while urinating, sometimes when she finishes she gets extra pain.  For has a full sensation and pressure in the bottom of her pelvis area.  Nothing is currently making it worse but holding her urine can make it feeling more uncomfortable.  Has not tried any over-the-counter medications at this time.  She only notices it or feels it when she is using the restroom or right after using the restroom.  Reports that she did have a one-time episode of right-sided pain but it has not returned since.  This was about a day ago.  Unsure if it was related.  She denies having any fevers, chills, blood in the urine, flank pain, back pain.  Review of Systems  Constitutional: Negative for chills and fever.  HENT: Negative for congestion, sinus pain and sore throat.   Eyes: Negative for pain and discharge.  Respiratory: Negative for cough, shortness of breath and wheezing.   Cardiovascular: Negative for chest pain, palpitations and leg swelling.  Genitourinary: Positive for dysuria, frequency and urgency.  Musculoskeletal:  Negative.   Neurological: Negative for dizziness, weakness and headaches.  Psychiatric/Behavioral: Negative.   All other systems reviewed and are negative.   Observations/Objective: n/a not webex    Assessment and Plan:  1. Burning with urination Currently signs and symptoms are consistent with acute cystitis uncomplicated.  She is recently been on penicillin.  This makes it a little bit more difficult to treat her.  As well as the fact that she is allergic to quinolones.  We will provide her with Macrobid, and Pyridium to help with the discomfort and spasming.  Advised her to take the medications as directed.  Advised her to drink lots of water during this time. Reviewed side effects, risks and benefits of medication.  She is in agreement with this plan.   - phenazopyridine (PYRIDIUM) 200 MG tablet; Take 1 tablet (200 mg total) by mouth 3 (three) times daily as needed for up to 2 days for pain.  Dispense: 6 tablet; Refill: 0 - nitrofurantoin, macrocrystal-monohydrate, (MACROBID) 100 MG capsule; Take 1 capsule (100 mg total) by mouth 2 (two) times daily for 5 days.  Dispense: 10 capsule; Refill: 0   Follow Up Instructions:   I discussed the assessment and treatment plan with the patient. The patient was provided an opportunity to ask questions and all were answered. The patient agreed with the plan and demonstrated an understanding of the instructions.   AVS printed and mailed to her. Follow up as needed  The patient was advised to call back or seek an in-person evaluation if the symptoms worsen or if the condition fails  to improve as anticipated.  I provided 15 minutes of non-face-to-face time during this encounter.   Perlie Mayo, NP

## 2018-11-17 NOTE — Patient Instructions (Addendum)
Thank you for coming into the office today. I appreciate the opportunity to provide you with the care for your health and wellness. Today we discussed: Possible Urinary Tract Infection  Please pick up your medications and take them as directed.     Urinary Tract Infection, Adult A urinary tract infection (UTI) is an infection of any part of the urinary tract. The urinary tract includes: The kidneys. The ureters. The bladder. The urethra. These organs make, store, and get rid of pee (urine) in the body. What are the causes? This is caused by germs (bacteria) in your genital area. These germs grow and cause swelling (inflammation) of your urinary tract. What increases the risk? You are more likely to develop this condition if: You have a small, thin tube (catheter) to drain pee. You cannot control when you pee or poop (incontinence). You are female, and: You have low levels of a female hormone (estrogen). You have genes that add to your risk. You take antibiotic medicines. You have trouble peeing because of: A blockage in the part of your body that drains pee from the bladder (urethra). A kidney stone. A nerve condition that affects your bladder (neurogenic bladder). Not getting enough to drink. Not peeing often enough. What are the signs or symptoms? Symptoms of this condition include: Needing to pee right away (urgently). Peeing often. Peeing small amounts often. Pain or burning when peeing. Blood in the pee. Pee that smells bad or not like normal. Trouble peeing. Pee that is cloudy. Fluid coming from the vagina, if you are female. Pain in the belly or lower back. Other symptoms include: Throwing up (vomiting). No urge to eat. Feeling mixed up (confused). Being tired and grouchy (irritable). A fever. Watery poop (diarrhea). How is this treated? This condition may be treated with: Antibiotic medicine. Other medicines. Drinking enough water. Follow these  instructions at home: Medicines Take over-the-counter and prescription medicines only as told by your doctor. If you were prescribed an antibiotic medicine, take it as told by your doctor. Do not stop taking it even if you start to feel better. General instructions Make sure you: Pee until your bladder is empty. Do not hold pee for a long time. Empty your bladder after sex. Wipe from front to back after pooping if you are a female. Use each tissue one time when you wipe. Drink enough fluid to keep your pee pale yellow. Keep all follow-up visits as told by your doctor. This is important. Contact a doctor if: You do not get better after 1-2 days. Your symptoms go away and then come back. Get help right away if: You have very bad back pain. You have very bad pain in your lower belly. You have a fever. You are sick to your stomach (nauseous). You are throwing up. Summary A urinary tract infection (UTI) is an infection of any part of the urinary tract. This condition is caused by germs in your genital area. There are many risk factors for a UTI. These include having a small, thin tube to drain pee and not being able to control when you pee or poop. Treatment includes antibiotic medicines for germs. Drink enough fluid to keep your pee pale yellow. This information is not intended to replace advice given to you by your health care provider. Make sure you discuss any questions you have with your health care provider. Document Released: 01/28/2008 Document Revised: 02/18/2018 Document Reviewed: 02/18/2018 Elsevier Interactive Patient Education  2019 [employer].  [place]ablo YOUR HANDS WELL AND FREQUENTLY. AVOID TOUCHING YOUR FACE, UNLESS YOUR HANDS ARE FRESHLY WASHED.   GET FRESH AIR DAILY. STAY HYDRATED WITH WATER.   It was a pleasure to see you and I look forward to continuing to work together on your health and well-being. Please do not hesitate to call the office if you need care or  have questions about your care.  Have a wonderful day and week.  With Gratitude,  [name], DNP, AGNP-BC

## 2018-11-18 ENCOUNTER — Other Ambulatory Visit: Payer: Self-pay | Admitting: Cardiovascular Disease

## 2018-12-22 ENCOUNTER — Telehealth: Payer: Self-pay | Admitting: Cardiovascular Disease

## 2018-12-22 NOTE — Telephone Encounter (Signed)
Home phone [phone] chart declined/ consent/ pre reg completed

## 2018-12-23 ENCOUNTER — Encounter: Payer: Self-pay | Admitting: Cardiovascular Disease

## 2018-12-23 ENCOUNTER — Telehealth (INDEPENDENT_AMBULATORY_CARE_PROVIDER_SITE_OTHER): Payer: PPO | Admitting: Cardiovascular Disease

## 2018-12-23 VITALS — Ht 61.5 in | Wt 185.0 lb

## 2018-12-23 DIAGNOSIS — I712 Thoracic aortic aneurysm, without rupture, unspecified: Secondary | ICD-10-CM

## 2018-12-23 DIAGNOSIS — E8881 Metabolic syndrome: Secondary | ICD-10-CM | POA: Diagnosis not present

## 2018-12-23 DIAGNOSIS — E785 Hyperlipidemia, unspecified: Secondary | ICD-10-CM | POA: Diagnosis not present

## 2018-12-23 DIAGNOSIS — N183 Chronic kidney disease, stage 3 unspecified: Secondary | ICD-10-CM

## 2018-12-23 DIAGNOSIS — I10 Essential (primary) hypertension: Secondary | ICD-10-CM

## 2018-12-23 DIAGNOSIS — Q248 Other specified congenital malformations of heart: Secondary | ICD-10-CM

## 2018-12-23 DIAGNOSIS — E669 Obesity, unspecified: Secondary | ICD-10-CM

## 2018-12-23 DIAGNOSIS — I129 Hypertensive chronic kidney disease with stage 1 through stage 4 chronic kidney disease, or unspecified chronic kidney disease: Secondary | ICD-10-CM

## 2018-12-23 NOTE — Patient Instructions (Signed)
Medication Instructions:  Continue same medications If you need a refill on your cardiac medications before your next appointment, please call your pharmacy.   Lab work: None ordered   Testing/Procedures: None ordered  Follow-Up: At [facility], you and your health needs are our priority.  As part of our continuing mission to provide you with exceptional heart care, we have created designated Provider Care Teams.  These Care Teams include your primary Cardiologist (physician) and Advanced Practice Providers (APPs -  Physician Assistants and Nurse Practitioners) who all work together to provide you with the care you need, when you need it. . Schedule follow up appointment with Dr.Croitoru in 12 months   Call 3 months before to schedule

## 2018-12-23 NOTE — Progress Notes (Signed)
Virtual Visit via Telephone Note   This visit type was conducted due to national recommendations for restrictions regarding the COVID-19 Pandemic (e.g. social distancing) in an effort to limit this patient's exposure and mitigate transmission in our community.  Due to her co-morbid illnesses, this patient is at least at moderate risk for complications without adequate follow up.  This format is felt to be most appropriate for this patient at this time.  The patient did not have access to video technology/had technical difficulties with video requiring transitioning to audio format only (telephone).  All issues noted in this document were discussed and addressed.  No physical exam could be performed with this format.  Please refer to the patient's chart for her  consent to telehealth for [facility].   Evaluation Performed:  Follow-up visit  Date:  12/23/2018   ID:  Desiree, [name] [birth year], MRN [id number]  Patient Location: Home Provider Location: Home  PCP:  [name], MD  Cardiologist:   Electrophysiologist:  None   Chief Complaint:  Fatigue  History of Present Illness:    Desiree [name] is a 73 y.o. female with longstanding hypertension, moderate and stable in size aneurysm of the ascending aorta (size unchanged since 2009, 4.2-4.3 cm, last CT December 2019, Dr. 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  She is generally doing well.  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  She washes her hands frequently.  She does complain of fatigue and has to take a brief nap when she comes home after work, but she wakes up every day at 5:30 AM and works 5-hour days.  She denies dyspnea with activity, orthopnea, PND, edema, chest pain, palpitations,  syncope, dizziness, claudication, leg edema.  She has a chronic cough first thing in the morning when she expels some mucus.  This has been going on for a long time and worsened in early March when she had a bout of acute bronchitis.  She does not have hemoptysis or wheezing.  She has not had any fever, headaches or sore throat.  When she saw Dr. Servando Snare in December for systolic murmur sounded louder and we ordered an echocardiogram.  She does not have significant aortic stenosis or any subvalvular obstruction, but does have a rather hyperdynamic left ventricle and some turbulence in the LVOT without true obstruction.  The left atrium was not dilated and although there was no comment in the report, when adjusted for age she has normal diastolic function parameters as well.  Her chest x-ray on March 4 did not show any active cardiopulmonary disease.  Her CT in December 2019 shows a stable size ascending aortic aneurysm and also shows unchanged chronic groundglass opacities in the lower lobes of both lungs.  Her most recent hemoglobin was 11.9 and her TSH is normal.  Creatinine is essentially unchanged with a border sign of around 1.2.  The patient does not have symptoms concerning for COVID-19 infection (fever, chills, cough, or new shortness of breath).    Past Medical History:  Diagnosis Date  . Allergic rhinitis   . Arthritis HIPS/ WRISTS  . DDD (degenerative disc disease), cervical   . GERD (gastroesophageal reflux disease) OCCASIONAL  . Heart murmur ASYMPTOMATIC  . History of Bell's palsy 2011  RIGHT SIDE -- RESOLVED  .  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 History of CVA (cerebrovascular accident) PER SCAN IN 2011  . Hyperlipidemia   . Hypertension   . Impaired fasting glucose PER PCP  DR SIMPSON   WATCH DIET  . Mixed urge and stress incontinence   . Nocturia   . Sinus drainage   . Thoracic ascending aortic  aneurysm (Desiree [name]) LAST CHEST CT 09-02-2010--  FOLLOWED BY  CARDIOLOGIST--  DR [id number]  . Vaginal wall prolapse    Past Surgical History:  Procedure Laterality Date  . BILATERAL CARPAL TUNNEL RELEASE  1980'S  . CARDIAC CATHETERIZATION  02-01-2009  DR EICHHORN   NORMAL CORONARY ARTERIES/ NORMAL LV SIZE AND FUNCTION/ AORTIC ROOT SIZE AT THE UPPER LIMIT OF NORMAL   . CARPAL TUNNEL RELEASE    . CERVICAL DISC SURGERY  12-12-2005  DR Vertell Limber   LEFT  C6 - C7 HERINATED / DDD/ SPONDYLOSIS  . COLONOSCOPY N/A 02/10/2014   Procedure: COLONOSCOPY;  Surgeon: Danie Binder, MD;  [facility];  Service: Endoscopy;  Laterality: N/A;  8:30  . CYSTOCELE REPAIR  08/02/2012   Procedure: ANTERIOR REPAIR (CYSTOCELE);  Surgeon: Ailene Rud, MD;  Location: [facility];  Service: Urology;  Laterality: N/A;  [employer] Uphold Anterior Pelvic Floor Sacrospinus Repair. Anterior wall of the vagina.  . ESOPHAGOGASTRODUODENOSCOPY N/A 02/10/2014   Procedure: ESOPHAGOGASTRODUODENOSCOPY (EGD);  Surgeon: Danie Binder, MD;  [facility];  Service: Endoscopy;  Laterality: N/A;  . GIVENS CAPSULE STUDY N/A 03/13/2014   Procedure: GIVENS CAPSULE STUDY;  Surgeon: Danie Binder, MD;  Location[facility];  Service: Endoscopy;  Laterality: N/A;  7:30  . NECK SURGERY  2007  . RIGHT PARTIAL MASTECTOMY  11-11-2004  DR [name] YOUNG   DUCTAL CARCINOMA IN SITU RIGHT BREAST  . TOTAL ABDOMINAL HYSTERECTOMY W/ BILATERAL SALPINGOOPHORECTOMY  1990  . TRANSTHORACIC ECHOCARDIOGRAM  03-30-2008  DR MARGARET SIMPSON   LV SIZE AND FUNCTION NORMAL/ MODERATE AORTIC ARCH DILATATION/ MILD MR     Current Meds  Medication Sig  . acetaminophen (TYLENOL) 650 MG CR tablet Take 650 mg by mouth every 8 (eight) hours as needed for pain.  . Ascorbic Acid (VITAMIN C) 1000 MG tablet Take 1,000 mg by mouth daily.  Marland Kitchen aspirin EC 325 MG tablet Take 1 tablet (325 mg total) by mouth daily.  . benzonatate (TESSALON)  100 MG capsule Take 1 capsule (100 mg total) by mouth 2 (two) times daily as needed for cough.  . calcium-vitamin D (OSCAL 500/200 D-3) 500-200 MG-UNIT per tablet Take 1 tablet by mouth daily with breakfast.   . carvedilol (COREG) 3.125 MG tablet TAKE 1 TABLET(3.125 MG) BY MOUTH TWICE DAILY WITH A MEAL  . cholecalciferol (VITAMIN D) 1000 UNITS tablet Take 1,000 Units by mouth daily.  Marland Kitchen gabapentin (NEURONTIN) 100 MG capsule TAKE TWO CAPSULES BY MOUTH ONCE DAILY AT BEDTIME  . hydrochlorothiazide (HYDRODIURIL) 25 MG tablet TAKE 1 TABLET BY MOUTH ONCE DAILY IN THE MORNING  . ibuprofen (ADVIL,MOTRIN) 800 MG tablet Take 1 tablet (800 mg total) by mouth 3 (three) times daily.  Marland Kitchen losartan (COZAAR) 50 MG tablet TAKE 1/2 (ONE-HALF) TABLET BY MOUTH ONCE DAILY  . lovastatin (MEVACOR) 40 MG tablet Take 1 tablet by mouth once daily with breakfast  . montelukast (SINGULAIR) 10 MG tablet TAKE 1 TABLET BY MOUTH AT BEDTIME  . Multiple Vitamin (MULTIVITAMIN) tablet Take 1  tablet by mouth daily.  . promethazine-phenylephrine (PROMETHAZINE VC) 6.25-5 MG/5ML SYRP Take half to one teaspoon at bedtime for excessive cough  . solifenacin (VESICARE) 10 MG tablet Take 10 mg by mouth daily.     Allergies:   Codeine and Quinolones   Social History   Tobacco Use  . Smoking status: Never Smoker  . Smokeless tobacco: Never Used  Substance Use Topics  . Alcohol use: No  . Drug use: No     Family Hx: The patient's family history includes Breast cancer in her sister; Heart attack (age of onset: 56) in her mother; Hypertension in her brother and mother; Kidney failure in her mother; Lung cancer (age of onset: 33) in her father; Prostate cancer in her father; Throat cancer in her father. There is no history of Colon cancer or Colon polyps.  ROS:   Please see the history of present illness.     All other systems reviewed and are negative.   Prior CV studies:   The following studies were reviewed today:  CT angiogram  of the aorta December 2019, echocardiogram January 2020, notes and labs from Dr. Moshe Cipro  Labs/Other Tests and Data Reviewed:    EKG:  An ECG dated 12/07/2017 was personally reviewed today and demonstrated:  Sinus bradycardia otherwise normal  Recent Labs: 10/23/2018: ALT 20; BUN 18; Creatinine, Ser 1.19; Hemoglobin 11.8; Platelets 313; Potassium 3.8; Sodium 145; TSH 3.780   Recent Lipid Panel Lab Results  Component Value Date/Time   CHOL 186 10/23/2018 08:57 AM   TRIG 37 10/23/2018 08:57 AM   HDL 86 10/23/2018 08:57 AM   CHOLHDL 2.0 05/11/2018 08:23 AM   CHOLHDL 2.2 10/26/2017 07:12 AM   LDLCALC 93 10/23/2018 08:57 AM   LDLCALC 78 10/26/2017 07:12 AM    Wt Readings from Last 3 Encounters:  12/23/18 185 lb (83.9 kg)  10/27/18 187 lb (84.8 kg)  10/05/18 185 lb (83.9 kg)     Objective:    Vital Signs:  Ht 5' 1.5" (1.562 m)   Wt 185 lb (83.9 kg)   BMI 34.39 kg/m    VITAL SIGNS:  reviewed She does not have a blood pressure cuff at home, but has had her blood pressure checked pretty much monthly at various doctor's appointments and it is consistently in the 120s/70s.  Her heart rate is in the high 50s low 60s.  ASSESSMENT & PLAN:    1. Asc Ao Aneurysm: Stable dimension for the last 10 years. 2. 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 3. HTN: Well-controlled, with antihypertensives including a beta-blocker and angiotensin receptor blocker. 4. HLP: Satisfactory lipid profile.  No evidence of atherosclerosis on CT of the chest and no known coronary or peripheral vascular problems.  LDL less than 100 on statin. 5. Fatigue: Low normal hemoglobin level and normal TSH.  Mildly bradycardic, but I think the benefits of the beta-blocker for the aneurysm in the mild LVOT obstruction are still worth continuing it. 6. CKD 3: stable, sees Dr. Lowanda Foster.   COVID-19 Education: The signs and symptoms of COVID-19 were discussed with the patient and how to seek care for testing (follow up with PCP or arrange E-visit).  The importance of social distancing was discussed today.  Time:   Today, I have spent 21 minutes with the patient with telehealth technology discussing the above problems.     Medication Adjustments/Labs  and Tests Ordered: Current medicines are reviewed at length with the patient today.  Concerns regarding medicines are outlined above.   Tests Ordered: No orders of the defined types were placed in this encounter.   Medication Changes: No orders of the defined types were placed in this encounter.   Disposition:  Follow up 12 months  Signed, [name], MD  12/23/2018 10:22 AM    [facility]

## 2019-01-24 ENCOUNTER — Ambulatory Visit (HOSPITAL_COMMUNITY): Payer: Self-pay

## 2019-02-05 ENCOUNTER — Other Ambulatory Visit: Payer: Self-pay | Admitting: Family Medicine

## 2019-02-10 ENCOUNTER — Other Ambulatory Visit: Payer: Self-pay | Admitting: Family Medicine

## 2019-02-15 ENCOUNTER — Telehealth: Payer: Self-pay | Admitting: Family Medicine

## 2019-02-15 ENCOUNTER — Other Ambulatory Visit: Payer: Self-pay

## 2019-02-15 MED ORDER — IBUPROFEN 800 MG PO TABS: 800.0000 mg | ORAL_TABLET | Freq: Three times a day (TID) | ORAL | 0 refills | Status: DC

## 2019-02-15 NOTE — Telephone Encounter (Signed)
Left arm and leg pain needs Ibuprofen called in --please call pt to advise

## 2019-02-15 NOTE — Telephone Encounter (Signed)
Medication refilled. Patient notified.

## 2019-02-24 ENCOUNTER — Other Ambulatory Visit: Payer: Self-pay

## 2019-02-24 MED ORDER — CARVEDILOL 3.125 MG PO TABS: ORAL_TABLET | ORAL | 1 refills | Status: DC

## 2019-02-28 ENCOUNTER — Other Ambulatory Visit: Payer: Self-pay

## 2019-02-28 ENCOUNTER — Ambulatory Visit (HOSPITAL_COMMUNITY)
Admission: RE | Admit: 2019-02-28 | Discharge: 2019-02-28 | Disposition: A | Discharge status: Home / Self Care | Payer: PPO | Source: Ambulatory Visit | Attending: Family Medicine | Admitting: Family Medicine

## 2019-02-28 DIAGNOSIS — Z1231 Encounter for screening mammogram for malignant neoplasm of breast: Secondary | ICD-10-CM | POA: Diagnosis not present

## 2019-04-11 ENCOUNTER — Encounter: Payer: Self-pay | Admitting: Family Medicine

## 2019-04-11 ENCOUNTER — Ambulatory Visit (INDEPENDENT_AMBULATORY_CARE_PROVIDER_SITE_OTHER): Payer: PPO | Admitting: Family Medicine

## 2019-04-11 ENCOUNTER — Other Ambulatory Visit: Payer: Self-pay

## 2019-04-11 VITALS — BP 120/78 | HR 63 | Temp 97.5°F | Resp 15 | Ht 62.0 in | Wt 190.4 lb

## 2019-04-11 DIAGNOSIS — J309 Allergic rhinitis, unspecified: Secondary | ICD-10-CM

## 2019-04-11 DIAGNOSIS — I10 Essential (primary) hypertension: Secondary | ICD-10-CM | POA: Diagnosis not present

## 2019-04-11 DIAGNOSIS — G8929 Other chronic pain: Secondary | ICD-10-CM

## 2019-04-11 DIAGNOSIS — J209 Acute bronchitis, unspecified: Secondary | ICD-10-CM | POA: Diagnosis not present

## 2019-04-11 DIAGNOSIS — E785 Hyperlipidemia, unspecified: Secondary | ICD-10-CM | POA: Diagnosis not present

## 2019-04-11 DIAGNOSIS — E669 Obesity, unspecified: Secondary | ICD-10-CM

## 2019-04-11 DIAGNOSIS — M25512 Pain in left shoulder: Secondary | ICD-10-CM | POA: Diagnosis not present

## 2019-04-11 MED ORDER — GABAPENTIN 100 MG PO CAPS: ORAL_CAPSULE | ORAL | 3 refills | Status: DC

## 2019-04-11 MED ORDER — CETIRIZINE HCL 10 MG PO TBDP: 1.0000 | ORAL_TABLET | Freq: Every day | ORAL | 5 refills | Status: DC

## 2019-04-11 MED ORDER — BENZONATATE 100 MG PO CAPS: 100.0000 mg | ORAL_CAPSULE | Freq: Two times a day (BID) | ORAL | 0 refills | Status: DC | PRN

## 2019-04-11 MED ORDER — PREDNISONE 10 MG PO TABS: 10.0000 mg | ORAL_TABLET | Freq: Two times a day (BID) | ORAL | 0 refills | Status: DC

## 2019-04-11 MED ORDER — METHYLPREDNISOLONE ACETATE 80 MG/ML IJ SUSP
80.0000 mg | Freq: Once | INTRAMUSCULAR | Status: AC
  Administered 2019-04-11: 17:00:00 80 mg via INTRAMUSCULAR

## 2019-04-11 MED ORDER — KETOROLAC TROMETHAMINE 60 MG/2ML IM SOLN
60.0000 mg | Freq: Once | INTRAMUSCULAR | Status: AC
  Administered 2019-04-11: 17:00:00 60 mg via INTRAMUSCULAR

## 2019-04-11 MED ORDER — LORATADINE 10 MG PO TABS: 10.0000 mg | ORAL_TABLET | Freq: Every day | ORAL | 11 refills | Status: DC

## 2019-04-11 MED ORDER — BETAMETHASONE DIPROPIONATE 0.05 % EX CREA: TOPICAL_CREAM | CUTANEOUS | 1 refills | Status: DC

## 2019-04-11 NOTE — Progress Notes (Signed)
   Desiree [name]     MRN: [id number]      DOB: [birth year]   HPI Ms. Desiree [name] is here for follow up and re-evaluation of chronic medical conditions, medication management and review of any available recent lab and radiology data.  Preventive health is updated, specifically  Cancer screening and Immunization.   Questions or concerns regarding consultations or procedures which the PT has had in the interim are  addressed. The PT denies any adverse reactions to current medications since the last visit.  4 month h/o increasing left shoulder pain with reduced mobility Left hair loss in front concerned , states it is itchy, no purulent drainage C/o increased and uncontrolled allergy symptoms, with post nasal drainage, clear and cough, no fver or chills  ROS Denies recent fever or chills. ,Denies abdominal pain, nausea, vomiting,diarrhea or constipation.   Denies dysuria, frequency, hesitancy or incontinence.  Denies headaches, seizures, numbness, or tingling. Denies depression, anxiety or insomnia.    PE  BP 120/78   Pulse 63   Temp (!) 97.5 F (36.4 C) (Temporal)   Resp 15   Ht 5\' 2"  (1.575 m)   Wt 190 lb 6.4 oz (86.4 kg)   SpO2 97%   BMI 34.82 kg/m   Patient alert and oriented and in no cardiopulmonary distress.  HEENT: No facial asymmetry, EOMI,   oropharynx pink and moist.  Neck supple no JVD, no mass.  Chest: Clear to auscultation bilaterally.  CVS: S1, S2 no murmurs, no S3.Regular rate.  ABD: Soft non tender.   Ext: No edema  MS: Adequate ROM spine, , hips and knees.decreased ROM left shoulder  Skin: Intact, no ulcerations or rash noted.balding noted along  left hairline  Psych: Good eye contact, normal affect. Memory intact not anxious or depressed appearing.  CNS: CN 2-12 intact, power,  normal throughout.no focal deficits noted.   Assessment & Plan  Left shoulder pain Uncontrolled.Toradol and depo medrol administered IM in the office , to be followed by a  short course of oral prednisone , will refer to ortho if persists   Allergic rhinitis Uncontrolled, zyrtec prescribed and also tessalon perle  Essential hypertension Controlled, no change in medication DASH diet and commitment to daily physical activity for a minimum of 30 minutes discussed and encouraged, as a part of hypertension management. The importance of attaining a healthy weight is also discussed.  BP/Weight 04/11/2019 12/23/2018 10/27/2018 10/05/2018 09/02/2018 08/19/2018 75/05/2584  Systolic BP 277 - 824 235 361 443 154  Diastolic BP 78 - 82 70 73 64 65  Wt. (Lbs) 190.4 185 187 185 183 183.6 184  BMI 34.82 34.39 34.76 34.39 34.02 34.13 34.2       Acute bronchitis No fever or chills , tessalon perle prescribed  Obesity (BMI 30-39.9)  Patient re-educated about  the importance of commitment to a  minimum of 150 minutes of exercise per week as able.  The importance of healthy food choices with portion control discussed, as well as eating regularly and within a 12 hour window most days. The need to choose "clean , green" food 50 to 75% of the time is discussed, as well as to make water the primary drink and set a goal of 64 ounces water daily.    Weight /BMI 04/11/2019 12/23/2018 10/27/2018  WEIGHT 190 lb 6.4 oz 185 lb 187 lb  HEIGHT 5\' 2"  5' 1.5" 5' 1.5"  BMI 34.82 kg/m2 34.39 kg/m2 34.76 kg/m2

## 2019-04-11 NOTE — Patient Instructions (Signed)
Annual physical exam with MD in February early March call if you need me sooner please.    Please keep annual wellness visit as scheduled.  Toradol 60 mg IM and Depo-Medrol 80 mg IM in the office today for left shoulder pain and 5-day course of prednisone as prescribed.  Do not take aspirin when taking the prednisone.   Call for referral to orthopedics if the pain and stiffness in left shoulder persists.  New for itchy rash is topical steroid cream to be used sparingly for 7 to 10 days then as needed.  If hair loss and itching persist please call for referral to dermatology.  New additional medicine for uncontrolled allergies is Zyrtec sertraline which is to be taken once daily do not fill prescription for Claritin just takes Zyrtec along with your Singulair.  Decongestant Perls have been refilled also for as needed use.  Fasting lipid CMP and EGFR in the next 3 to 4 weeks.

## 2019-04-11 NOTE — Assessment & Plan Note (Addendum)
Uncontrolled.Toradol and depo medrol administered IM in the office , to be followed by a short course of oral prednisone , will refer to ortho if persists

## 2019-04-17 ENCOUNTER — Encounter: Payer: Self-pay | Admitting: Family Medicine

## 2019-04-17 NOTE — Assessment & Plan Note (Signed)
Uncontrolled, zyrtec prescribed and also tessalon perle

## 2019-04-17 NOTE — Assessment & Plan Note (Signed)
No fever or chills , tessalon perle prescribed

## 2019-04-17 NOTE — Assessment & Plan Note (Signed)
  Patient re-educated about  the importance of commitment to a  minimum of 150 minutes of exercise per week as able.  The importance of healthy food choices with portion control discussed, as well as eating regularly and within a 12 hour window most days. The need to choose "clean , green" food 50 to 75% of the time is discussed, as well as to make water the primary drink and set a goal of 64 ounces water daily.    Weight /BMI 04/11/2019 12/23/2018 10/27/2018  WEIGHT 190 lb 6.4 oz 185 lb 187 lb  HEIGHT 5\' 2"  5' 1.5" 5' 1.5"  BMI 34.82 kg/m2 34.39 kg/m2 34.76 kg/m2

## 2019-04-17 NOTE — Assessment & Plan Note (Signed)
Controlled, no change in medication DASH diet and commitment to daily physical activity for a minimum of 30 minutes discussed and encouraged, as a part of hypertension management. The importance of attaining a healthy weight is also discussed.  BP/Weight 04/11/2019 12/23/2018 10/27/2018 10/05/2018 09/02/2018 08/19/2018 [id number]  Systolic BP 123456 - 123456 123456 [id number]  Diastolic BP 78 - 82 70 73 64 65  Wt. (Lbs) 190.4 185 187 185 183 183.6 184  BMI 34.82 34.39 34.76 34.39 34.02 34.13 34.2

## 2019-04-25 ENCOUNTER — Other Ambulatory Visit: Payer: Self-pay

## 2019-04-25 ENCOUNTER — Ambulatory Visit (INDEPENDENT_AMBULATORY_CARE_PROVIDER_SITE_OTHER): Payer: PPO

## 2019-04-25 DIAGNOSIS — Z23 Encounter for immunization: Secondary | ICD-10-CM | POA: Diagnosis not present

## 2019-05-04 ENCOUNTER — Other Ambulatory Visit: Payer: Self-pay | Admitting: Family Medicine

## 2019-05-05 ENCOUNTER — Encounter: Payer: Self-pay | Admitting: Family Medicine

## 2019-05-05 DIAGNOSIS — H25013 Cortical age-related cataract, bilateral: Secondary | ICD-10-CM | POA: Diagnosis not present

## 2019-05-05 DIAGNOSIS — H35033 Hypertensive retinopathy, bilateral: Secondary | ICD-10-CM | POA: Diagnosis not present

## 2019-05-05 DIAGNOSIS — H2513 Age-related nuclear cataract, bilateral: Secondary | ICD-10-CM | POA: Diagnosis not present

## 2019-05-05 DIAGNOSIS — H524 Presbyopia: Secondary | ICD-10-CM | POA: Diagnosis not present

## 2019-05-05 DIAGNOSIS — H35413 Lattice degeneration of retina, bilateral: Secondary | ICD-10-CM | POA: Diagnosis not present

## 2019-05-06 DIAGNOSIS — I10 Essential (primary) hypertension: Secondary | ICD-10-CM | POA: Diagnosis not present

## 2019-05-06 DIAGNOSIS — E785 Hyperlipidemia, unspecified: Secondary | ICD-10-CM | POA: Diagnosis not present

## 2019-05-06 LAB — COMPLETE METABOLIC PANEL WITH GFR
AG Ratio: 1.4 (calc) (ref 1.0–2.5)
ALT: 14 U/L (ref 6–29)
AST: 22 U/L (ref 10–35)
Albumin: 3.9 g/dL (ref 3.6–5.1)
Alkaline phosphatase (APISO): 64 U/L (ref 37–153)
BUN/Creatinine Ratio: 25 (calc) — ABNORMAL HIGH (ref 6–22)
BUN: 24 mg/dL (ref 7–25)
CO2: 34 mmol/L — ABNORMAL HIGH (ref 20–32)
Calcium: 9.5 mg/dL (ref 8.6–10.4)
Chloride: 103 mmol/L (ref 98–110)
Creat: 0.97 mg/dL — ABNORMAL HIGH (ref 0.60–0.93)
GFR, Est African American: 68 mL/min/{1.73_m2} (ref >=60)
GFR, Est Non African American: 58 mL/min/{1.73_m2} — ABNORMAL LOW (ref >=60)
Globulin: 2.7 g/dL (calc) (ref 1.9–3.7)
Glucose, Bld: 96 mg/dL (ref 65–99)
Potassium: 3.6 mmol/L (ref 3.5–5.3)
Sodium: 143 mmol/L (ref 135–146)
Total Bilirubin: 0.5 mg/dL (ref 0.2–1.2)
Total Protein: 6.6 g/dL (ref 6.1–8.1)

## 2019-05-06 LAB — LIPID PANEL
Cholesterol: 168 mg/dL (ref <200)
HDL: 79 mg/dL (ref >=50)
LDL Cholesterol (Calc): 78 mg/dL (calc)
Non-HDL Cholesterol (Calc): 89 mg/dL (calc) (ref <130)
Total CHOL/HDL Ratio: 2.1 (calc) (ref <5.0)
Triglycerides: 37 mg/dL (ref <150)

## 2019-05-10 ENCOUNTER — Encounter: Payer: Self-pay | Admitting: *Deleted

## 2019-06-08 ENCOUNTER — Other Ambulatory Visit: Payer: Self-pay | Admitting: Family Medicine

## 2019-06-13 ENCOUNTER — Telehealth: Payer: Self-pay | Admitting: *Deleted

## 2019-06-13 NOTE — Telephone Encounter (Signed)
Pt said she was in a lot of pain over the weekend. This is better now and she knows she needs a knee replacement but she was also breaking out in sweats this weekend and was weak and felt bad. Wanted to know if she should go get a covid test done. Would like a call back.

## 2019-06-13 NOTE — Telephone Encounter (Signed)
Spoke with patient. She stated she is feeling a whole lot better now. Advised her if she felt better she could hold off on testing. Her symptoms may be been due to her pain from her hip and knee. If she started to feel bad again then she could go get testing. She verbalized understanding.

## 2019-06-16 ENCOUNTER — Other Ambulatory Visit: Payer: Self-pay

## 2019-06-16 ENCOUNTER — Ambulatory Visit: Payer: Self-pay

## 2019-06-16 ENCOUNTER — Encounter: Payer: Self-pay | Admitting: Family Medicine

## 2019-06-16 ENCOUNTER — Ambulatory Visit (INDEPENDENT_AMBULATORY_CARE_PROVIDER_SITE_OTHER): Payer: PPO | Admitting: Family Medicine

## 2019-06-16 VITALS — BP 120/78 | HR 63 | Resp 15 | Ht 62.0 in | Wt 190.0 lb

## 2019-06-16 DIAGNOSIS — Z0000 Encounter for general adult medical examination without abnormal findings: Secondary | ICD-10-CM

## 2019-06-16 DIAGNOSIS — J309 Allergic rhinitis, unspecified: Secondary | ICD-10-CM

## 2019-06-16 MED ORDER — CETIRIZINE HCL 10 MG PO TBDP: 1.0000 | ORAL_TABLET | Freq: Every day | ORAL | 3 refills | Status: DC

## 2019-06-16 NOTE — Progress Notes (Signed)
Subjective:   Desiree [name] is a 73 y.o. female who presents for Medicare Annual (Subsequent) preventive examination.  Location of Patient: Home Location of Provider: Telehealth Consent was obtain for visit to be over via telehealth. I verified that I am speaking with the correct person using two identifiers.   Review of Systems:    Cardiac Risk Factors include: advanced age (>56men, >53 women);obesity (BMI >30kg/m2);dyslipidemia;hypertension     Objective:     Vitals: BP 120/78   Pulse 63   Resp 15   Ht 5\' 2"  (1.575 m)   Wt 190 lb (86.2 kg)   BMI 34.75 kg/m   Body mass index is 34.75 kg/m.  Advanced Directives 07/01/2018 06/16/2018 06/15/2018 06/10/2018 06/10/2017 02/26/2017 02/12/2017  Does Patient Have a Medical Advance Directive? No No No No No No No  Would patient like information on creating a medical advance directive? No - Patient declined No - Patient declined No - Patient declined No - Patient declined Yes (MAU/Ambulatory/Procedural Areas - Information given) - No - Patient declined    Tobacco Social History   Tobacco Use  Smoking Status Never Smoker  Smokeless Tobacco Never Used     Counseling given: Yes   Clinical Intake:  Pre-visit preparation completed: Yes  Pain : No/denies pain Pain Score: 0-No pain     BMI - recorded: 34.75 Nutritional Status: BMI > 30  Obese Nutritional Risks: None Diabetes: No  How often do you need to have someone help you when you read instructions, pamphlets, or other written materials from your doctor or pharmacy?: 1 - Never What is the last grade level you completed in school?: 12 and some college  Interpreter Needed?: No     Past Medical History:  Diagnosis Date  . Allergic rhinitis   . Arthritis HIPS/ WRISTS  . DDD (degenerative disc disease), cervical   . GERD (gastroesophageal reflux disease) OCCASIONAL  . Heart murmur ASYMPTOMATIC  . History of Bell's palsy 2011  RIGHT SIDE -- RESOLVED  . 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 History of CVA (cerebrovascular accident) PER SCAN IN 2011  . Hyperlipidemia   . Hypertension   . Impaired fasting glucose PER PCP  DR SIMPSON   WATCH DIET  . Mixed urge and stress incontinence   . Nocturia   . Sinus drainage   . Thoracic ascending aortic aneurysm ([place]) LAST CHEST CT 09-02-2010--  FOLLOWED BY  CARDIOLOGIST--  DR [id number]  . Vaginal wall prolapse    Past Surgical History:  Procedure Laterality Date  . BILATERAL CARPAL TUNNEL RELEASE  1980'S  . CARDIAC CATHETERIZATION  02-01-2009  DR EICHHORN   NORMAL CORONARY ARTERIES/ NORMAL LV SIZE AND FUNCTION/ AORTIC ROOT SIZE AT THE UPPER LIMIT OF NORMAL   . CARPAL TUNNEL RELEASE    . CERVICAL DISC SURGERY  12-12-2005  DR Vertell Limber   LEFT  C6 - C7 HERINATED / DDD/ SPONDYLOSIS  . COLONOSCOPY N/A 02/10/2014   Procedure: COLONOSCOPY;  Surgeon: Danie Binder, MD;  [facility];  Service: Endoscopy;  Laterality: N/A;  8:30  . CYSTOCELE REPAIR  08/02/2012   Procedure: ANTERIOR REPAIR (CYSTOCELE);  Surgeon: Ailene Rud, MD;  Location: [facility];  Service: Urology;  Laterality: N/A;  [employer] Uphold Anterior Pelvic Floor Sacrospinus Repair. Anterior wall of the vagina.  . ESOPHAGOGASTRODUODENOSCOPY N/A 02/10/2014   Procedure: ESOPHAGOGASTRODUODENOSCOPY (EGD);  Surgeon: Carlyon Prows  Rexene Edison, MD;  [facility];  Service: Endoscopy;  Laterality: N/A;  . GIVENS CAPSULE STUDY N/A 03/13/2014   Procedure: GIVENS CAPSULE STUDY;  Surgeon: Danie Binder, MD;  [facility];  Service: Endoscopy;  Laterality: N/A;  7:30  . NECK SURGERY  2007  . RIGHT PARTIAL MASTECTOMY  11-11-2004  DR [name] YOUNG   DUCTAL CARCINOMA IN SITU RIGHT BREAST  . TOTAL ABDOMINAL HYSTERECTOMY W/ BILATERAL SALPINGOOPHORECTOMY  1990  . TRANSTHORACIC ECHOCARDIOGRAM  03-30-2008  DR MARGARET SIMPSON   LV SIZE AND FUNCTION  NORMAL/ MODERATE AORTIC ARCH DILATATION/ MILD MR   Family History  Problem Relation Age of Onset  . Kidney failure Mother   . Hypertension Mother   . Heart attack Mother 57  . Throat cancer Father   . Prostate cancer Father   . Lung cancer Father 39  . Breast cancer Sister   . Hypertension Brother        2 stents  . Colon cancer Neg Hx   . Colon polyps Neg Hx    Social History   Socioeconomic History  . Marital status: Divorced    Spouse name: Not on file  . Number of children: 4  . Years of education: 12+  . Highest education level: 12th grade  Occupational History  . Not on file  Social Needs  . Financial resource strain: Not hard at all  . Food insecurity    Worry: Never true    Inability: Never true  . Transportation needs    Medical: No    Non-medical: No  Tobacco Use  . Smoking status: Never Smoker  . Smokeless tobacco: Never Used  Substance and Sexual Activity  . Alcohol use: No  . Drug use: No  . Sexual activity: Not Currently  Lifestyle  . Physical activity    Days per week: 0 days    Minutes per session: 0 min  . Stress: Not at all  Relationships  . Social connections    Talks on phone: More than three times a week    Gets together: Once a week    Attends religious service: 1 to 4 times per year    Active member of club or organization: Yes    Attends meetings of clubs or organizations: 1 to 4 times per year    Relationship status: Divorced  Other Topics Concern  . Not on file  Social History Narrative   RETIRED FROM [employer]. 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    Outpatient Encounter Medications as of 06/16/2019  Medication Sig  . acetaminophen (TYLENOL) 650 MG CR tablet Take 650 mg by mouth every 8 (eight) hours as needed for pain.  . Ascorbic Acid (VITAMIN C) 1000 MG tablet Take 1,000 mg by mouth daily.  Marland Kitchen aspirin EC 325 MG tablet Take 1 tablet (325 mg total) by mouth daily.  . benzonatate (TESSALON) 100 MG capsule Take 1 capsule (100 mg total) by  mouth 2 (two) times daily as needed for cough.  . betamethasone dipropionate (DIPROLENE) 0.05 % cream Apply sparingly two times daily for 10 days, then as needed, to affected areas( hairline, neck)  . calcium-vitamin D (OSCAL 500/200 D-3) 500-200 MG-UNIT per tablet Take 1 tablet by mouth daily with breakfast.   . carvedilol (COREG) 3.125 MG tablet TAKE 1 TABLET(3.125 MG) BY MOUTH TWICE DAILY WITH A MEAL  . Cetirizine HCl 10 MG TBDP Take 1 tablet by mouth daily.  . cholecalciferol (VITAMIN D) 1000 UNITS tablet  Take 1,000 Units by mouth daily.  Marland Kitchen gabapentin (NEURONTIN) 100 MG capsule TAKE TWO CAPSULES BY MOUTH ONCE DAILY AT BEDTIME  . hydrochlorothiazide (HYDRODIURIL) 25 MG tablet TAKE 1 TABLET BY MOUTH ONCE DAILY IN THE MORNING  . ibuprofen (ADVIL) 800 MG tablet Take 1 tablet (800 mg total) by mouth 3 (three) times daily.  Marland Kitchen loratadine (CLARITIN) 10 MG tablet Take 1 tablet (10 mg total) by mouth daily.  Marland Kitchen losartan (COZAAR) 50 MG tablet TAKE 1/2 (ONE-HALF) TABLET BY MOUTH ONCE DAILY  . lovastatin (MEVACOR) 40 MG tablet Take 1 tablet by mouth once daily with breakfast  . montelukast (SINGULAIR) 10 MG tablet TAKE 1 TABLET BY MOUTH AT BEDTIME  . Multiple Vitamin (MULTIVITAMIN) tablet Take 1 tablet by mouth daily.  . predniSONE (DELTASONE) 10 MG tablet Take 1 tablet (10 mg total) by mouth 2 (two) times daily with a meal.  . solifenacin (VESICARE) 10 MG tablet Take 10 mg by mouth daily.  . [DISCONTINUED] amLODipine (NORVASC) 10 MG tablet Take 1 tablet (10 mg total) by mouth daily.   No facility-administered encounter medications on file as of 06/16/2019.     Activities of Daily Living In your present state of health, do you have any difficulty performing the following activities: 06/16/2019  Hearing? N  Vision? N  Difficulty concentrating or making decisions? N  Walking or climbing stairs? N  Dressing or bathing? N  Doing errands, shopping? N  Preparing Food and eating ? N  Using the Toilet? N   In the past six months, have you accidently leaked urine? N  Do you have problems with loss of bowel control? N  Managing your Medications? N  Managing your Finances? N  Housekeeping or managing your Housekeeping? N  Some recent data might be hidden    Patient Care Team: [name], MD as PCP - General Grace Isaac, MD as Consulting Physician (Cardiothoracic Surgery) Croitoru, Dani Gobble, MD as Consulting Physician (Cardiology) Hortencia Pilar, MD as Consulting Physician (Ophthalmology) Carolan Clines, MD (Inactive) as Consulting Physician (Urology)    Assessment:   This is a routine wellness examination for Rula.  Exercise Activities and Dietary recommendations Current Exercise Habits: Home exercise routine, Type of exercise: walking, Time (Minutes): 35, Frequency (Times/Week): 5, Weekly Exercise (Minutes/Week): 175, Intensity: Mild, Exercise limited by: None identified  Goals    . Cut out extra servings (pt-stated)     I would love to talk with a dietician and learn to eat better     . Increase physical activity    . Weight (lb) < 175 lb (79.4 kg)     Starting 08/05/2016 patient would like to maintain her current exercise routine and slowly increase to 40 minutes 3 times a week.       Fall Risk Fall Risk  06/16/2019 04/11/2019 10/27/2018 10/05/2018 10/05/2018  Falls in the past year? 0 0 1 1 1   Number falls in past yr: 0 0 1 1 0  Injury with Fall? 0 0 0 1 0  Risk for fall due to : - - Orthopedic patient History of fall(s);Impaired mobility -  Follow up - - - Falls evaluation completed;Education provided;Falls prevention discussed -   Is the patient's home free of loose throw rugs in walkways, pet beds, electrical cords, etc?   yes      Grab bars in the bathroom? yes      Handrails on the stairs?   yes      Adequate lighting?  yes     Depression Screen PHQ 2/9 Scores 06/16/2019 04/11/2019 10/27/2018 10/05/2018  PHQ - 2 Score 0 0 0 0  PHQ- 9 Score -  - - -     Cognitive Function MMSE - Mini Mental State Exam 09/21/2014  Orientation to time 5  Orientation to Place 5  Registration 3  Attention/ Calculation 4  Recall 3  Language- name 2 objects 2  Language- repeat 1  Language- follow 3 step command 2  Language- read & follow direction 1  Write a sentence 1  Copy design 1  Total score 28     6CIT Screen 06/16/2019 06/14/2018 06/10/2017 08/05/2016  What Year? 0 points 0 points 0 points 0 points  What month? 0 points 0 points 0 points 0 points  What time? 0 points 0 points 0 points 0 points  Count back from 20 0 points 0 points 0 points 0 points  Months in reverse 0 points 0 points 0 points 0 points  Repeat phrase 0 points 2 points 0 points 0 points  Total Score 0 2 0 0    Immunization History  Administered Date(s) Administered  . Fluad Quad(high Dose 65+) 04/25/2019  . Influenza Split 07/15/2011, 06/30/2012  . Influenza Whole 06/13/2010  . Influenza,inj,Quad PF,6+ Mos 07/13/2013, 05/22/2014, 06/12/2015, 07/08/2016, 05/27/2017, 05/05/2018  . Pneumococcal Conjugate-13 09/21/2014  . Pneumococcal Polysaccharide-23 11/11/2011  . Tdap 07/15/2011  . Zoster 11/09/2006    Qualifies for Shingles Vaccine? completed  Screening Tests Health Maintenance  Topic Date Due  . MAMMOGRAM  02/27/2021  . TETANUS/TDAP  07/14/2021  . COLONOSCOPY  02/13/2024  . INFLUENZA VACCINE  Completed  . DEXA SCAN  Completed  . Hepatitis C Screening  Completed  . PNA vac Low Risk Adult  Completed    Cancer Screenings: Lung: Low Dose CT Chest recommended if Age 24-80 years, 30 pack-year currently smoking OR have quit w/in 15years. Patient does not qualify. Breast:  Up to date on Mammogram? Yes   Up to date of Bone Density/Dexa? Yes Colorectal:  Due 2025  Additional Screenings:   Hepatitis C Screening: completed     Plan:       1. Encounter for Medicare annual wellness exam  I have personally reviewed and noted the following in the  patient's chart:   . Medical and social history . Use of alcohol, tobacco or illicit drugs  . Current medications and supplements . Functional ability and status . Nutritional status . Physical activity . Advanced directives . List of other physicians . Hospitalizations, surgeries, and ER visits in previous 12 months . Vitals . Screenings to include cognitive, depression, and falls . Referrals and appointments  In addition, I have reviewed and discussed with patient certain preventive protocols, quality metrics, and best practice recommendations. A written personalized care plan for preventive services as well as general preventive health recommendations were provided to patient.     I provided 20 minutes of non-face-to-face time during this encounter.   Perlie Mayo, NP  06/16/2019

## 2019-06-16 NOTE — Patient Instructions (Signed)
Ms. Desiree [name] , Thank you for taking time to come for your Medicare Wellness Visit. I appreciate your ongoing commitment to your health goals. Please review the following plan we discussed and let me know if I can assist you in the future.   Please continue to practice social distancing to keep you, your family, and our community safe.  If you must go out, please wear a Mask and practice good handwashing.  Screening recommendations/referrals: Colonoscopy: Due 2025 Mammogram: Completed Bone Density: Completed Recommended yearly ophthalmology/optometry visit for glaucoma screening and checkup Recommended yearly dental visit for hygiene and checkup  Vaccinations: Influenza vaccine: Completed, Due again Fall 2021 Pneumococcal vaccine: Completed Tdap vaccine: Due 2022 Shingles vaccine: Completed  Advanced directives: you reported you have paperwork   Conditions/risks identified: Fall  Next appointment: 10/18/2019    Preventive Care 27 Years and Older, Female Preventive care refers to lifestyle choices and visits with your health care provider that can promote health and wellness. What does preventive care include?  A yearly physical exam. This is also called an annual well check.  Dental exams once or twice a year.  Routine eye exams. Ask your health care provider how often you should have your eyes checked.  Personal lifestyle choices, including:  Daily care of your teeth and gums.  Regular physical activity.  Eating a healthy diet.  Avoiding tobacco and drug use.  Limiting alcohol use.  Practicing safe sex.  Taking low-dose aspirin every day.  Taking vitamin and mineral supplements as recommended by your health care provider. What happens during an annual well check? The services and screenings done by your health care provider during your annual well check will depend on your age, overall health, lifestyle risk factors, and family history of disease. Counseling   Your health care provider may ask you questions about your:  Alcohol use.  Tobacco use.  Drug use.  Emotional well-being.  Home and relationship well-being.  Sexual activity.  Eating habits.  History of falls.  Memory and ability to understand (cognition).  Work and work Statistician.  Reproductive health. Screening  You may have the following tests or measurements:  Height, weight, and BMI.  Blood pressure.  Lipid and cholesterol levels. These may be checked every 5 years, or more frequently if you are over 72 years old.  Skin check.  Lung cancer screening. You may have this screening every year starting at age 72 if you have a 30-pack-year history of smoking and currently smoke or have quit within the past 15 years.  Fecal occult blood test (FOBT) of the stool. You may have this test every year starting at age 72.  Flexible sigmoidoscopy or colonoscopy. You may have a sigmoidoscopy every 5 years or a colonoscopy every 10 years starting at age 72.  Hepatitis C blood test.  Hepatitis B blood test.  Sexually transmitted disease (STD) testing.  Diabetes screening. This is done by checking your blood sugar (glucose) after you have not eaten for a while (fasting). You may have this done every 1-3 years.  Bone density scan. This is done to screen for osteoporosis. You may have this done starting at age 72.  Mammogram. This may be done every 1-2 years. Talk to your health care provider about how often you should have regular mammograms. Talk with your health care provider about your test results, treatment options, and if necessary, the need for more tests. Vaccines  Your health care provider may recommend certain vaccines, such as:  Influenza vaccine.  This is recommended every year.  Tetanus, diphtheria, and acellular pertussis (Tdap, Td) vaccine. You may need a Td booster every 10 years.  Zoster vaccine. You may need this after age 72.  Pneumococcal 13-valent  conjugate (PCV13) vaccine. One dose is recommended after age 72.  Pneumococcal polysaccharide (PPSV23) vaccine. One dose is recommended after age 72. Talk to your health care provider about which screenings and vaccines you need and how often you need them. This information is not intended to replace advice given to you by your health care provider. Make sure you discuss any questions you have with your health care provider. Document Released: 09/07/2015 Document Revised: 04/30/2016 Document Reviewed: 06/12/2015 Elsevier Interactive Patient Education  2017 [place] Prevention in the Home Falls can cause injuries. They can happen to people of all ages. There are many things you can do to make your home safe and to help prevent falls. What can I do on the outside of my home?  Regularly fix the edges of walkways and driveways and fix any cracks.  Remove anything that might make you trip as you walk through a door, such as a raised step or threshold.  Trim any bushes or trees on the path to your home.  Use bright outdoor lighting.  Clear any walking paths of anything that might make someone trip, such as rocks or tools.  Regularly check to see if handrails are loose or broken. Make sure that both sides of any steps have handrails.  Any raised decks and porches should have guardrails on the edges.  Have any leaves, snow, or ice cleared regularly.  Use sand or salt on walking paths during winter.  Clean up any spills in your garage right away. This includes oil or grease spills. What can I do in the bathroom?  Use night lights.  Install grab bars by the toilet and in the tub and shower. Do not use towel bars as grab bars.  Use non-skid mats or decals in the tub or shower.  If you need to sit down in the shower, use a plastic, non-slip stool.  Keep the floor dry. Clean up any water that spills on the floor as soon as it happens.  Remove soap buildup in the tub or  shower regularly.  Attach bath mats securely with double-sided non-slip rug tape.  Do not have throw rugs and other things on the floor that can make you trip. What can I do in the bedroom?  Use night lights.  Make sure that you have a light by your bed that is easy to reach.  Do not use any sheets or blankets that are too big for your bed. They should not hang down onto the floor.  Have a firm chair that has side arms. You can use this for support while you get dressed.  Do not have throw rugs and other things on the floor that can make you trip. What can I do in the kitchen?  Clean up any spills right away.  Avoid walking on wet floors.  Keep items that you use a lot in easy-to-reach places.  If you need to reach something above you, use a strong step stool that has a grab bar.  Keep electrical cords out of the way.  Do not use floor polish or wax that makes floors slippery. If you must use wax, use non-skid floor wax.  Do not have throw rugs and other things on the floor that can make  you trip. What can I do with my stairs?  Do not leave any items on the stairs.  Make sure that there are handrails on both sides of the stairs and use them. Fix handrails that are broken or loose. Make sure that handrails are as long as the stairways.  Check any carpeting to make sure that it is firmly attached to the stairs. Fix any carpet that is loose or worn.  Avoid having throw rugs at the top or bottom of the stairs. If you do have throw rugs, attach them to the floor with carpet tape.  Make sure that you have a light switch at the top of the stairs and the bottom of the stairs. If you do not have them, ask someone to add them for you. What else can I do to help prevent falls?  Wear shoes that:  Do not have high heels.  Have rubber bottoms.  Are comfortable and fit you well.  Are closed at the toe. Do not wear sandals.  If you use a stepladder:  Make sure that it is fully  opened. Do not climb a closed stepladder.  Make sure that both sides of the stepladder are locked into place.  Ask someone to hold it for you, if possible.  Clearly mark and make sure that you can see:  Any grab bars or handrails.  First and last steps.  Where the edge of each step is.  Use tools that help you move around (mobility aids) if they are needed. These include:  Canes.  Walkers.  Scooters.  Crutches.  Turn on the lights when you go into a dark area. Replace any light bulbs as soon as they burn out.  Set up your furniture so you have a clear path. Avoid moving your furniture around.  If any of your floors are uneven, fix them.  If there are any pets around you, be aware of where they are.  Review your medicines with your doctor. Some medicines can make you feel dizzy. This can increase your chance of falling. Ask your doctor what other things that you can do to help prevent falls. This information is not intended to replace advice given to you by your health care provider. Make sure you discuss any questions you have with your health care provider. Document Released: 06/07/2009 Document Revised: 01/17/2016 Document Reviewed: 09/15/2014 Elsevier Interactive Patient Education  2017 [employer].

## 2019-06-20 ENCOUNTER — Ambulatory Visit: Payer: Self-pay

## 2019-06-20 ENCOUNTER — Other Ambulatory Visit: Payer: Self-pay | Admitting: Family Medicine

## 2019-07-12 ENCOUNTER — Other Ambulatory Visit: Payer: Self-pay | Admitting: *Deleted

## 2019-07-12 DIAGNOSIS — I712 Thoracic aortic aneurysm, without rupture, unspecified: Secondary | ICD-10-CM

## 2019-07-14 ENCOUNTER — Other Ambulatory Visit: Payer: Self-pay | Admitting: Cardiovascular Disease

## 2019-07-14 ENCOUNTER — Other Ambulatory Visit: Payer: Self-pay

## 2019-07-14 ENCOUNTER — Ambulatory Visit (INDEPENDENT_AMBULATORY_CARE_PROVIDER_SITE_OTHER): Payer: PPO | Admitting: Family Medicine

## 2019-07-14 VITALS — BP 120/78 | HR 63 | Temp 97.3°F | Resp 15 | Ht 62.0 in | Wt 184.0 lb

## 2019-07-14 DIAGNOSIS — J309 Allergic rhinitis, unspecified: Secondary | ICD-10-CM

## 2019-07-14 DIAGNOSIS — I10 Essential (primary) hypertension: Secondary | ICD-10-CM

## 2019-07-14 DIAGNOSIS — E785 Hyperlipidemia, unspecified: Secondary | ICD-10-CM | POA: Diagnosis not present

## 2019-07-14 DIAGNOSIS — M542 Cervicalgia: Secondary | ICD-10-CM

## 2019-07-14 MED ORDER — PANTOPRAZOLE SODIUM 40 MG PO TBEC: 40.0000 mg | DELAYED_RELEASE_TABLET | Freq: Every day | ORAL | 0 refills | Status: DC

## 2019-07-14 MED ORDER — PREDNISONE 20 MG PO TABS: ORAL_TABLET | ORAL | 0 refills | Status: DC

## 2019-07-14 MED ORDER — METHYLPREDNISOLONE ACETATE 80 MG/ML IJ SUSP
120.0000 mg | Freq: Once | INTRAMUSCULAR | Status: AC
  Administered 2019-07-14: 120 mg via INTRAMUSCULAR

## 2019-07-14 MED ORDER — KETOROLAC TROMETHAMINE 60 MG/2ML IM SOLN
60.0000 mg | Freq: Once | INTRAMUSCULAR | Status: AC
  Administered 2019-07-14: 60 mg via INTRAMUSCULAR

## 2019-07-14 MED ORDER — BENZONATATE 100 MG PO CAPS: 100.0000 mg | ORAL_CAPSULE | Freq: Two times a day (BID) | ORAL | 0 refills | Status: DC | PRN

## 2019-07-14 MED ORDER — IBUPROFEN 800 MG PO TABS: 800.0000 mg | ORAL_TABLET | Freq: Three times a day (TID) | ORAL | 0 refills | Status: DC | PRN

## 2019-07-14 NOTE — Patient Instructions (Signed)
Keep February appointment as before , call if you need me sooner  Toradol 60 mg IM , and Depo medrol 120 mg Im in office today  Short course of ibuprofen prednisone  And protonix are prescribed   Please use your allergy meds every day as prescribed  Thanks for choosing [facility], we consider it a privelige to serve you.

## 2019-07-14 NOTE — Progress Notes (Signed)
   Desiree [name]     MRN: [id number]      DOB: 1945-10-02   HPI Desiree [name] is here with a 1 month h/o uncontrolled left neck to elbow pain , limiting UE movement, tingles to hand No specific trauma aggravating symptoms. Has had C spine surgery in the past No other concerns  ROS Denies recent fever or chills. C/o sinus pressure and  nasal congestion, denies ear pain or sore throat. Denies chest congestion, productive cough or wheezing. Denies chest pains, palpitations and leg swelling Denies abdominal pain, nausea, vomiting,diarrhea or constipation.   Denies dysuria, frequency, hesitancy or incontinence.  Denies skin break down or rash.   PE  BP 120/78   Pulse 63   Temp (!) 97.3 F (36.3 C) (Temporal)   Resp 15   Ht 5\' 2"  (1.575 m)   Wt 184 lb (83.5 kg)   SpO2 98%   BMI 33.65 kg/m   Patient alert and oriented and in no cardiopulmonary distress.Pt in pain  HEENT: No facial asymmetry, EOMI,     Neck decreased ROM.  Chest: Clear to auscultation bilaterally.  CVS: S1, S2 no murmurs, no S3.Regular rate.  ABD: Soft non tender.   Ext: No edema  MS: decreased ROM left shoulder  Skin: Intact, no ulcerations or rash noted.  Psych: Good eye contact, normal affect. Memory intact not anxious or depressed appearing.  CNS: CN 2-12 intact, power,  normal throughout.no focal deficits noted.   Assessment & Plan Neck pain on left side Uncontrolled.Toradol and depo medrol administered IM in the office , to be followed by a short course of oral prednisone and NSAIDS.   Allergic rhinitis Uncontrolled , pt to commit to daily allergy pill  Essential hypertension Controlled, no change in medication DASH diet and commitment to daily physical activity for a minimum of 30 minutes discussed and encouraged, as a part of hypertension management. The importance of attaining a healthy weight is also discussed.  BP/Weight 07/14/2019 06/16/2019 04/11/2019 12/23/2018 10/27/2018 10/05/2018  [id number]  Systolic BP 123456 123456 123456 - 123456 123456 0000000  Diastolic BP 78 78 78 - 82 70 73  Wt. (Lbs) 184 190 190.4 185 187 185 183  BMI 33.65 34.75 34.82 34.39 34.76 34.39 34.02       Hyperlipidemia LDL goal <100 Hyperlipidemia:Low fat diet discussed and encouraged.   Lipid Panel  Lab Results  Component Value Date   CHOL 168 05/06/2019   HDL 79 05/06/2019   LDLCALC 78 05/06/2019   TRIG 37 05/06/2019   CHOLHDL 2.1 05/06/2019  Controlled, no change in medication

## 2019-07-17 ENCOUNTER — Encounter: Payer: Self-pay | Admitting: Family Medicine

## 2019-07-17 NOTE — Assessment & Plan Note (Signed)
Uncontrolled , pt to commit to daily allergy pill

## 2019-07-17 NOTE — Assessment & Plan Note (Signed)
Hyperlipidemia:Low fat diet discussed and encouraged.   Lipid Panel  Lab Results  Component Value Date   CHOL 168 05/06/2019   HDL 79 05/06/2019   LDLCALC 78 05/06/2019   TRIG 37 05/06/2019   CHOLHDL 2.1 05/06/2019  Controlled, no change in medication

## 2019-07-17 NOTE — Assessment & Plan Note (Signed)
Uncontrolled.Toradol and depo medrol administered IM in the office , to be followed by a short course of oral prednisone and NSAIDS.  

## 2019-07-17 NOTE — Assessment & Plan Note (Signed)
Controlled, no change in medication DASH diet and commitment to daily physical activity for a minimum of 30 minutes discussed and encouraged, as a part of hypertension management. The importance of attaining a healthy weight is also discussed.  BP/Weight 07/14/2019 06/16/2019 04/11/2019 12/23/2018 10/27/2018 [id number]  Systolic BP 123456 123456 123456 - 123456 123456 0000000  Diastolic BP 78 78 78 - 82 70 73  Wt. (Lbs) 184 190 190.4 185 187 185 183  BMI 33.65 34.75 34.82 34.39 34.76 34.39 34.02

## 2019-07-31 ENCOUNTER — Other Ambulatory Visit: Payer: Self-pay | Admitting: Family Medicine

## 2019-08-11 ENCOUNTER — Ambulatory Visit (INDEPENDENT_AMBULATORY_CARE_PROVIDER_SITE_OTHER): Payer: PPO | Admitting: Cardiothoracic Surgery

## 2019-08-11 ENCOUNTER — Ambulatory Visit
Admission: RE | Admit: 2019-08-11 | Discharge: 2019-08-11 | Disposition: A | Discharge status: Home / Self Care | Payer: PPO | Source: Ambulatory Visit | Attending: Cardiothoracic Surgery | Admitting: Cardiothoracic Surgery

## 2019-08-11 ENCOUNTER — Other Ambulatory Visit: Payer: Self-pay

## 2019-08-11 DIAGNOSIS — Z712 Person consulting for explanation of examination or test findings: Secondary | ICD-10-CM

## 2019-08-11 DIAGNOSIS — I712 Thoracic aortic aneurysm, without rupture, unspecified: Secondary | ICD-10-CM

## 2019-08-11 DIAGNOSIS — I10 Essential (primary) hypertension: Secondary | ICD-10-CM

## 2019-08-11 MED ORDER — IOPAMIDOL (ISOVUE-370) INJECTION 76%
75.0000 mL | Freq: Once | INTRAVENOUS | Status: AC | PRN
  Administered 2019-08-11: 75 mL via INTRAVENOUS

## 2019-08-11 NOTE — Progress Notes (Signed)
[address]       [place] [address]             [phone]   Telehealth Visit     Virtual Visit via Telephone Note   This visit type was conducted due to national recommendations for restrictions regarding the COVID-19 Pandemic (e.g. social distancing) in an effort to limit this patient's exposure and mitigate transmission in our community.  Due to her co-morbid illnesses, this patient is at least at moderate risk for complications without adequate follow up.  This format is felt to be most appropriate for this patient at this time.  The patient did not have access to video technology/had technical difficulties with video requiring transitioning to audio format only (telephone).  All issues noted in this document were discussed and addressed.  No physical exam could be performed with this format.    This visit type was conducted due to national recommendations for restrictions regarding the COVID-19 Pandemic (e.g. social distancing).  This format is felt to be most appropriate for this patient at this time.  All issues noted in this document were discussed and addressed.  No physical exam was performed (except for noted visual exam findings with Video Visits).  Please refer to the patient's chart (MyChart message for video visits and phone note for telephone visits) for the patient's consent to telehealth for [facility].                  [facility] Record [id number] Date of Birth: October 10, 1945  Referring: [name], MD Primary Care: [name], MD Cardiology: Dr  Bertrum Sol  Chief Complaint:    Follow up cta of chest    History of Present Illness:    Patient is a 3 -year-old female comes today for follow up cta of the chest  who has had a known dilated ascending aorta since at least 2009 with serial CT scans of the chest having been performed. She was  referred to me several years ago after a recent CT scan showed  4.4 cm dilated ascending aorta.   Previous CT scans are reviewed with descending aorta 4.3 cm 2009   Current Activity/ Functional Status:  Patient is independent with mobility/ambulation, transfers, ADL's, IADL's.  Zubrod Score: At the time of surgery this patient's most appropriate activity status/level should be described as: []  Normal activity, no symptoms [x]  Symptoms, fully ambulatory []  Symptoms, in bed less than or equal to 50% of the time []  Symptoms, in bed greater than 50% of the time but less than 100% []  Bedridden []  Moribund   Past Medical History:  Diagnosis Date  . Allergic rhinitis   . Arthritis HIPS/ WRISTS  . DDD (degenerative disc disease), cervical   . GERD (gastroesophageal reflux disease) OCCASIONAL  . Heart murmur ASYMPTOMATIC  . History of Bell's palsy 2011  RIGHT SIDE -- RESOLVED  . 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 History of CVA (cerebrovascular accident) PER SCAN IN 2011  . Hyperlipidemia   . Hypertension   . Impaired fasting glucose PER PCP  DR SIMPSON   WATCH DIET  . Mixed urge and stress incontinence   . Nocturia   . Sinus drainage   . Thoracic ascending aortic aneurysm ([place]) LAST CHEST CT 09-02-2010--  FOLLOWED BY  CARDIOLOGIST--  DR NO:[id number]  . Vaginal wall prolapse  Past Surgical History:  Procedure Laterality Date  . BILATERAL CARPAL TUNNEL RELEASE  1980'S  . CARDIAC CATHETERIZATION  02-01-2009  DR EICHHORN   NORMAL CORONARY ARTERIES/ NORMAL LV SIZE AND FUNCTION/ AORTIC ROOT SIZE AT THE UPPER LIMIT OF NORMAL   . CARPAL TUNNEL RELEASE    . CERVICAL DISC SURGERY  12-12-2005  DR Vertell Limber   LEFT  C6 - C7 HERINATED / DDD/ SPONDYLOSIS  . COLONOSCOPY N/A 02/10/2014   Procedure: COLONOSCOPY;  Surgeon: Danie Binder, MD;  [facility];  Service: Endoscopy;  Laterality: N/A;  8:30  . CYSTOCELE REPAIR  08/02/2012   Procedure: ANTERIOR REPAIR (CYSTOCELE);   Surgeon: Ailene Rud, MD;  Location: [facility];  Service: Urology;  Laterality: N/A;  [employer] Uphold Anterior Pelvic Floor Sacrospinus Repair. Anterior wall of the vagina.  . ESOPHAGOGASTRODUODENOSCOPY N/A 02/10/2014   Procedure: ESOPHAGOGASTRODUODENOSCOPY (EGD);  Surgeon: Danie Binder, MD;  [facility];  Service: Endoscopy;  Laterality: N/A;  . GIVENS CAPSULE STUDY N/A 03/13/2014   Procedure: GIVENS CAPSULE STUDY;  Surgeon: Danie Binder, MD;  [facility];  Service: Endoscopy;  Laterality: N/A;  7:30  . NECK SURGERY  2007  . RIGHT PARTIAL MASTECTOMY  11-11-2004  DR [name] YOUNG   DUCTAL CARCINOMA IN SITU RIGHT BREAST  . TOTAL ABDOMINAL HYSTERECTOMY W/ BILATERAL SALPINGOOPHORECTOMY  1990  . TRANSTHORACIC ECHOCARDIOGRAM  03-30-2008  DR MARGARET SIMPSON   LV SIZE AND FUNCTION NORMAL/ MODERATE AORTIC ARCH DILATATION/ MILD MR    Family History  Problem Relation Age of Onset  . Heart attack Mother 72  . Kidney failure Mother   . Hypertension Mother   . Throat cancer Father 75  . Prostate cancer Father     History   Social History  . Marital Status: Divorced    Spouse Name: N/A    Number of Children: 50  . Years of Education: N/A   Occupational History  . Not on file.   Social History Main Topics  . Smoking status: Never Smoker   . Smokeless tobacco: Never Used  . Alcohol Use: No  . Drug Use: No  . Sexual Activity: Not on file   Other Topics Concern  . 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 Codeine Other (See Comments)    CONFUSION/ DIZZY  . Quinolones     Current Outpatient Medications  Medication Sig Dispense Refill  . acetaminophen (TYLENOL) 650 MG CR tablet Take 650  mg by mouth every 8 (eight) hours as needed for pain.    . Ascorbic Acid (VITAMIN C) 1000 MG tablet Take 1,000 mg by mouth daily.    Marland Kitchen aspirin EC 325 MG tablet Take 1 tablet (325 mg total) by mouth daily. 100 tablet 3  . benzonatate (TESSALON) 100 MG capsule Take 1 capsule (100 mg total) by mouth 2 (two) times daily as needed for cough. 20 capsule 0  . betamethasone dipropionate (DIPROLENE) 0.05 % cream Apply sparingly two times daily for 10 days, then as needed, to affected areas( hairline, neck) 45 g 1  . calcium-vitamin D (OSCAL 500/200 D-3) 500-200 MG-UNIT per tablet Take 1 tablet by mouth daily  with breakfast.     . carvedilol (COREG) 3.125 MG tablet TAKE 1 TABLET(3.125 MG) BY MOUTH TWICE DAILY WITH A MEAL 180 tablet 1  . Cetirizine HCl 10 MG TBDP Take 1 tablet by mouth daily. 90 tablet 3  . cholecalciferol (VITAMIN D) 1000 UNITS tablet Take 1,000 Units by mouth daily.    Marland Kitchen gabapentin (NEURONTIN) 100 MG capsule TAKE TWO CAPSULES BY MOUTH ONCE DAILY AT BEDTIME 180 capsule 3  . hydrochlorothiazide (HYDRODIURIL) 25 MG tablet TAKE 1 TABLET BY MOUTH ONCE DAILY IN THE MORNING 90 tablet 0  . ibuprofen (ADVIL) 800 MG tablet Take 1 tablet (800 mg total) by mouth every 8 (eight) hours as needed. 30 tablet 0  . loratadine (CLARITIN) 10 MG tablet Take 1 tablet (10 mg total) by mouth daily. 30 tablet 11  . losartan (COZAAR) 50 MG tablet Take 1/2 (one-half) tablet by mouth once daily 45 tablet 0  . lovastatin (MEVACOR) 40 MG tablet Take 1 tablet by mouth once daily with breakfast 90 tablet 0  . montelukast (SINGULAIR) 10 MG tablet TAKE 1 TABLET BY MOUTH AT BEDTIME 90 tablet 0  . Multiple Vitamin (MULTIVITAMIN) tablet Take 1 tablet by mouth daily.    . pantoprazole (PROTONIX) 40 MG tablet Take 1 tablet (40 mg total) by mouth daily. 21 tablet 0  . predniSONE (DELTASONE) 20 MG tablet Take one tablet by mouth three times daily for 2 days, then one tablet two times daily for 2 days, then one tablet once daily  for 3 days, then stop 13 tablet 0  . solifenacin (VESICARE) 10 MG tablet Take 10 mg by mouth daily.     No current facility-administered medications for this visit.       Review of Systems:    Physical Exam: There were no vitals taken for this visit.   Diagnostic Studies & Laboratory data:   ECHO: *[address], [place] [address]                            [id number]  ------------------------------------------------------------------- Transthoracic Echocardiography  (Report amended )  Patient:    Evoleth, Dubeau MR #:       [id number] Study Date: 08/26/2018 Gender:     F Age:        30 Height:     156.2 cm Weight:     83.3 kg BSA:        1.94 m^2 Pt. Status: Room:   ATTENDING    [name], MD  ORDERING     [name], MD  REFERRING    [name], MD  PERFORMING   [facility]  SONOGRAPHER  Alvino Chapel, RCS  cc:  ------------------------------------------------------------------- LV EF: 65% -   70%  ------------------------------------------------------------------- Indications:      Murmur 785.2.  ------------------------------------------------------------------- History:   PMH:  Acquired from the patient and from the patient&'s chart.  PMH:  GERD. History of CVA (cerebrovascular accident) Obesity. NEOPLASM, MALIGNANT, BREAST, RIGHT.  Risk factors: Hypertension. Dyslipidemia.  ------------------------------------------------------------------- Study Conclusions  - Left ventricle: Turbulent flow through LVOT No obstruction. The   cavity size was normal. Wall thickness was  increased in a pattern   of mild LVH. Systolic function was vigorous. The estimated   ejection fraction was in the range of 65% to 70%. - Aortic valve: Valve area (VTI): 2.02 cm^2. Valve area (Vmax):   2.08 cm^2. Valve area (Vmean): 2.17  cm^2.  ------------------------------------------------------------------- Study data:  Comparison was made to the study of 06/29/2013.  Study status:  Routine.  Procedure:  The patient reported no pain pre or post test. Transthoracic echocardiography. Image quality was adequate.  Study completion:  There were no complications. Transthoracic echocardiography.  M-mode, complete 2D, spectral Doppler, and color Doppler.  Birthdate:  Patient birthdate: 1945/11/13.  Age:  Patient is 73 yr old.  Sex:  Gender: female. BMI: 34.1 kg/m^2.  Blood pressure:     132/78  Patient status: Inpatient.  Study date:  Study date: 08/26/2018. Study time: 12:58 PM.  Location:  Echo laboratory.  -------------------------------------------------------------------  ------------------------------------------------------------------- Left ventricle:  Turbulent flow through LVOT No obstruction. The cavity size was normal. Wall thickness was increased in a pattern of mild LVH. Systolic function was vigorous. The estimated ejection fraction was in the range of 65% to 70%.  ------------------------------------------------------------------- Aortic valve:   Mildly thickened leaflets. Sclerosis without stenosis.  Doppler:  There was no significant regurgitation.    VTI ratio of LVOT to aortic valve: 0.8. Valve area (VTI): 2.02 cm^2. Indexed valve area (VTI): 1.04 cm^2/m^2. Peak velocity ratio of LVOT to aortic valve: 0.82. Valve area (Vmax): 2.08 cm^2. Indexed valve area (Vmax): 1.07 cm^2/m^2. Mean velocity ratio of LVOT to aortic valve: 0.85. Valve area (Vmean): 2.17 cm^2. Indexed valve area (Vmean): 1.12 cm^2/m^2.    Mean gradient (S): 9 mm Hg. Peak gradient (S): 17 mm Hg.  ------------------------------------------------------------------- Mitral valve:   Structurally normal valve.   Leaflet separation was normal.  Doppler:  Transvalvular velocity was within the normal range. There was no evidence for  stenosis. There was no regurgitation.    Peak gradient (D): 3 mm Hg.  ------------------------------------------------------------------- Left atrium:  The atrium was normal in size.  ------------------------------------------------------------------- Right ventricle:  The cavity size was normal. Wall thickness was normal. Systolic function was normal.  ------------------------------------------------------------------- Pulmonic valve:    Structurally normal valve.   Cusp separation was normal.  Doppler:  Transvalvular velocity was within the normal range. There was trivial regurgitation.  ------------------------------------------------------------------- Tricuspid valve:   Structurally normal valve.   Leaflet separation was normal.  Doppler:  Transvalvular velocity was within the normal range. There was mild regurgitation.  ------------------------------------------------------------------- Right atrium:  The atrium was normal in size.  ------------------------------------------------------------------- Pericardium:  There was no pericardial effusion.  ------------------------------------------------------------------- Systemic veins: Inferior vena cava: The vessel was normal in size. The respirophasic diameter changes were in the normal range (= 50%), consistent with normal central venous pressure.  ------------------------------------------------------------------- Measurements   Left ventricle                           Value          Reference  LV ID, ED, PLAX chordal          (L)     39.1  mm       43 - 52  LV ID, ES, PLAX chordal                  24.6  mm       23 - 38  LV fx shortening, PLAX chordal  37    %        >=29  LV PW thickness, ED                      10.8  mm       ----------  IVS/LV PW ratio, ED                      1.17           <=1.3  Stroke volume, 2D                        94    ml       ----------  Stroke volume/bsa, 2D                     48    ml/m^2   ----------  LV ejection fraction, 1-p A4C            70    %        ----------  LV end-diastolic volume, 2-p             75    ml       ----------  LV end-systolic volume, 2-p              23    ml       ----------  LV ejection fraction, 2-p                70    %        ----------  Stroke volume, 2-p                       52    ml       ----------  LV end-diastolic volume/bsa, 2-p         39    ml/m^2   ----------  LV end-systolic volume/bsa, 2-p          12    ml/m^2   ----------  Stroke volume/bsa, 2-p                   26.9  ml/m^2   ----------  LV e&', lateral                           10.6  cm/s     ----------  LV E/e&', lateral                         7.57           ----------  LV e&', medial                            7.94  cm/s     ----------  LV E/e&', medial                          10.1           ----------  LV e&', average                           9.27  cm/s     ----------  LV E/e&', average  8.65           ----------    Ventricular septum                       Value          Reference  IVS thickness, ED                        12.6  mm       ----------    LVOT                                     Value          Reference  LVOT ID, S                               18    mm       ----------  LVOT ID, A-P                             17.02 mm       ----------  LVOT area                                2.54  cm^2     ----------  LVOT peak velocity, S                    171   cm/s     ----------  LVOT mean velocity, S                    117   cm/s     ----------  LVOT VTI, S                              37.2  cm       ----------  LVOT peak gradient, S                    12    mm Hg    ----------    Aortic valve                             Value          Reference  Aortic valve peak velocity, S            209   cm/s     ----------  Aortic valve mean velocity, S            137   cm/s     ----------  Aortic valve VTI, S                       46.7  cm       ----------  Aortic mean gradient, S                  9     mm Hg    ----------  Aortic peak gradient, S                  17  mm Hg    ----------  VTI ratio, LVOT/AV                       0.8            ----------  Aortic valve area, VTI                   2.02  cm^2     ----------  Aortic valve area/bsa, VTI               1.04  cm^2/m^2 ----------  Velocity ratio, peak, LVOT/AV            0.82           ----------  Aortic valve area, peak velocity         2.08  cm^2     ----------  Aortic valve area/bsa, peak              1.07  cm^2/m^2 ----------  velocity  Velocity ratio, mean, LVOT/AV            0.85           ----------  Aortic valve area, mean velocity         2.17  cm^2     ----------  Aortic valve area/bsa, mean              1.12  cm^2/m^2 ----------  velocity    Aorta                                    Value          Reference  Aortic root ID, ED                       34    mm       ----------    Left atrium                              Value          Reference  LA ID, A-P, ES                           37    mm       ----------  LA ID/bsa, A-P                           1.91  cm/m^2   <=2.2  LA volume, S                             53.2  ml       ----------  LA volume/bsa, S                         27.4  ml/m^2   ----------  LA volume, ES, 1-p A4C                   58.3  ml       ----------  LA volume/bsa, ES, 1-p A4C               30.1  ml/m^2   ----------  LA volume, ES,  1-p A2C                   47.7  ml       ----------  LA volume/bsa, ES, 1-p A2C               24.6  ml/m^2   ----------    Mitral valve                             Value          Reference  Mitral E-wave peak velocity              80.2  cm/s     ----------  Mitral A-wave peak velocity              109   cm/s     ----------  Mitral deceleration time         (H)     342   ms       150 - 230  Mitral peak gradient, D                  3     mm Hg    ----------  Mitral E/A ratio, peak                    0.7            ----------    Pulmonary arteries                       Value          Reference  PA pressure, S, DP                       23    mm Hg    <=30    Tricuspid valve                          Value          Reference  Tricuspid regurg peak velocity           223   cm/s     ----------  Tricuspid peak RV-RA gradient            20    mm Hg    ----------    Right atrium                             Value          Reference  RA ID, S-I, ES, A4C              (H)     51.3  mm       34 - 49  RA area, ES, A4C                         14.2  cm^2     8.3 - 19.5  RA volume, ES, A/L                       32.5  ml       ----------  RA volume/bsa, ES, A/L                   16.8  ml/m^2   ----------    Systemic veins                           Value          Reference  Estimated CVP                            3     mm Hg    ----------    Right ventricle                          Value          Reference  RV ID, ED, PLAX                          26.9  mm       19 - 38  TAPSE                                    22.8  mm       ----------  RV pressure, S, DP                       23    mm Hg    <=30  RV s&', lateral, S                        11.9  cm/s     ----------  Legend: (L)  and  (H)  mark values outside specified reference range.  ------------------------------------------------------------------- Vilinda Blanks, M.D. 2020-01-02T17:5   06/29/2013 Transthoracic Echocardiography  Patient:  Saanvi, Malia MR #:    [id number] Study Date: 06/29/2013 Gender:   F Age:    73 Height:   154.9cm Weight:   84.8kg BSA:    1.30m^2 Pt. Status: Room:  ATTENDING  Jimmy Picket SONOGRAPHER Marygrace Drought, RCS PERFORMING  [facility] cc:  ------------------------------------------------------------ LV EF: 55% -   65%  ------------------------------------------------------------ Indications:   424.1 Aortic valve disorders.  ------------------------------------------------------------ History:  PMH: Hyperlipidemia Chest pain. Dyspnea. Risk factors: Hypertension.  ------------------------------------------------------------ Study Conclusions  - Left ventricle: The cavity size was normal. Wall thickness was normal. Systolic function was normal. The estimated ejection fraction was in the range of 55% to 65%. Wall motion was normal; there were no regional wall motion abnormalities. Left ventricular diastolic function parameters were normal. - Aortic valve: Trileaflet; mildly thickened leaflets. Mild regurgitation. - Mitral valve: Systolic bowing without prolapse. Mild regurgitation. Valve area by pressure half-time: 1.9cm^2. - Atrial septum: No defect or patent foramen ovale was identified. - Tricuspid valve: Mild regurgitation. Transthoracic echocardiography. M-mode, complete 2D, spectral Doppler, and color Doppler. Height: Height: 154.9cm. Height: 61in. Weight: Weight: 84.8kg. Weight: 186.6lb. Body mass index: BMI: 35.3kg/m^2. Body surface area:  BSA: 1.78m^2. Blood pressure:   134/80. Patient status: Outpatient. Location: Echo laboratory.  ------------------------------------------------------------  ------------------------------------------------------------ Left ventricle: The cavity size was normal. Wall thickness was normal. Systolic function was normal. The estimated ejection fraction was in the range of 55% to 65%. Wall motion was normal; there were no regional wall motion abnormalities. The transmitral flow pattern was normal. The deceleration time of the early transmitral flow velocity was  normal. The pulmonary vein flow pattern was normal. The tissue Doppler parameters were normal. Left ventricular diastolic function parameters were  normal.  ------------------------------------------------------------ Aortic valve:  Trileaflet; mildly thickened leaflets. Doppler:  There was no stenosis.  Mild regurgitation. VTI ratio of LVOT to aortic valve: 0.54. Peak velocity ratio of LVOT to aortic valve: 0.53.  Mean gradient: 7mm Hg (S). Peak gradient: 87mm Hg (S).  ------------------------------------------------------------ Mitral valve:  Mildly thickened leaflets . Systolic bowing without prolapse. Systolic bowing without prolapse. Doppler:  Mild regurgitation.  Valve area by pressure half-time: 1.9cm^2. Indexed valve area by pressure half-time: 1.03cm^2/m^2.  Mean gradient: 45mm Hg (D). Peak gradient: 49mm Hg (D).  ------------------------------------------------------------ Left atrium: LA volume/ BSA = 25.83ml/m2 The atrium was normal in size.  ------------------------------------------------------------ Atrial septum: No defect or patent foramen ovale was identified.  ------------------------------------------------------------ Right ventricle: The cavity size was normal. Systolic function was normal. Systolic pressure was within the normal range.  ------------------------------------------------------------ Pulmonic valve:  Doppler:  Trivial regurgitation.  ------------------------------------------------------------ Tricuspid valve:  Doppler:  Mild regurgitation.  ------------------------------------------------------------ Pulmonary artery:  The main pulmonary artery was normal-sized.  ------------------------------------------------------------ Right atrium: The atrium was normal in size.  ------------------------------------------------------------ Pericardium: There was no pericardial effusion.  ------------------------------------------------------------ Systemic veins: Inferior vena cava: The vessel was normal in size; the respirophasic diameter changes were in the normal  range (= 50%); findings are consistent with normal central venous pressure. Diameter: 56mm.  ------------------------------------------------------------  2D measurements    Normal Doppler measurements  Normal IVC              Main pulmonary Diam     11 mm   ------ artery Left ventricle         Pressure,  25 mm Hg =30 LVID ED,  43.8 mm   43-52  S chord,             Left ventricle PLAX              Ea, lat   11 cm/s  ------ LVID ES,  29.8 mm   23-38  ann, tiss chord,             DP PLAX              E/Ea, lat 7.58    ------ FS, chord,  32 %   >29   ann, tiss PLAX              DP LVPW, ED  9.89 mm   ------ Ea, med   7.5 cm/s  ------ IVS/LVPW   1    <1.3  ann, tiss ratio, ED           DP Ventricular septum       E/Ea, med 11.1    ------ IVS, ED  9.89 mm   ------ ann, tiss   2 Aorta             DP Root diam,  42 mm   ------ LVOT ED               Peak vel,  108 cm/s  ------ Left atrium          S AP dim    39 mm   ------ VTI, S   27.5 cm   ------ AP dim   2.12 cm/m^2 <2.2  Aortic valve index             Peak vel,  204 cm/s  ------ Right ventricle  S RVID ED,  28.7 mm   19-38  Mean vel,  138 cm/s  ------ PLAX              S                VTI, S   51.1 cm   ------                Mean     9 mm Hg ------                gradient,                S                Peak     17 mm Hg ------                gradient,                S                VTI ratio 0.54    ------                LVOT/AV                Peak vel  0.53    ------                 ratio,                LVOT/AV                Regurg PHT 816 ms   ------                Mitral valve                Peak E vel 83.4 cm/s  ------                Peak A vel 82.4 cm/s  ------                Mean vel, 54.6 cm/s  ------                D                Decelerati 190 ms   150-23                on time        0                Pressure  116 ms   ------                half-time                Mean     1 mm Hg ------                gradient,                D                Peak     4 mm Hg ------                gradient,                D                Peak E/A   1    ------  ratio                Area (PHT) 1.9 cm^2  ------                Area index 1.03 cm^2/m ------                (PHT)      ^2                Annulus  40.4 cm   ------                VTI                Tricuspid valve                Regurg   221 cm/s  ------                peak vel                Peak RV-RA  20 mm Hg ------                gradient,                S                Systemic veins                Estimated   5 mm Hg ------                CVP                Right ventricle                Pressure,  25 mm Hg <30                S                Sa vel,  11.8 cm/s  ------                lat  ann,                tiss DP  ------------------------------------------------------------ Prepared and Electronically Authenticated by  Shelva Majestic   [id number]:[id number]   Recent Radiology Findings: CT ANGIO CHEST AORTA W &/OR WO CONTRAST  Result Date: 08/11/2019 CLINICAL DATA:  Thoracic aortic aneurysm, status change EXAM: CT ANGIOGRAPHY CHEST WITH CONTRAST TECHNIQUE: Multidetector CT imaging of the chest was performed using the standard protocol during bolus administration of intravenous contrast. Multiplanar CT image reconstructions and MIPs were obtained to evaluate the vascular anatomy. CONTRAST:  106mL ISOVUE-370 IOPAMIDOL (ISOVUE-370) INJECTION 76% COMPARISON:  08/19/2018 FINDINGS: Cardiovascular: Dilation of the ascending thoracic aorta measuring approximately 4 point 2 cm in greatest caliber. Minimal atherosclerotic change in the aortic arch. Heart size is stable. Central pulmonary vasculature is normal. Descending thoracic aorta is normal. Mediastinum/Nodes: No enlarged mediastinal, hilar, or axillary lymph nodes. Thyroid gland, trachea, and esophagus demonstrate no significant findings. Lungs/Pleura: Juxta diaphragmatic atelectasis on the right. Similar pattern to prior study with small areas of air trapping also possible but unchanged. Airways are patent. Upper Abdomen: Stable appearance of hepatic cysts. Musculoskeletal: Visualized clavicles and scapulae are intact. Sternum is intact. No signs of acute bone finding or evidence of destructive bone process with degenerative changes in the spine. Review of the MIP images confirms the above findings. IMPRESSION: 1. Stable dilatation of the ascending thoracic aorta measuring  approximately 4.2 cm in greatest caliber. Recommend annual imaging followup by CTA or MRA. This recommendation follows 2010 ACCF/AHA/AATS/ACR/ASA/SCA/SCAI/SIR/STS/SVM Guidelines for the Diagnosis and Management of Patients with Thoracic Aortic  Disease. Circulation. 2010; [id number]:[id number]. Aortic aneurysm NOS (ICD10-I71.9) 2. Stable subpleural atelectasis on the right. 3. Stable small areas of air trapping. Aortic Atherosclerosis (ICD10-I70.0). Electronically Signed   By: Zetta Bills M.D.   On: 08/11/2019 12:17   Ct Angio Chest Aorta W/cm &/or Wo/cm  Result Date: 08/19/2018 CLINICAL DATA:  Followup thoracic aortic aneurysm. EXAM: CT ANGIOGRAPHY CHEST WITH CONTRAST TECHNIQUE: Multidetector CT imaging of the chest was performed using the standard protocol during bolus administration of intravenous contrast. Multiplanar CT image reconstructions and MIPs were obtained to evaluate the vascular anatomy. CONTRAST:  66mL ISOVUE-370 IOPAMIDOL (ISOVUE-370) INJECTION 76% Creatinine was obtained on site at [facility] at [address] Results: Creatinine 1.1 mg/dL. COMPARISON:  08/20/2017 FINDINGS: Cardiovascular: Heart size appears within normal limits. No pericardial effusion identified. The ascending thoracic aorta measures 4.2 cm and maximum diameter, image 56/5. This is unchanged from the previous exam. The transverse aortic arch measures up to 2.8 cm. At the level of the diaphragmatic hiatus the descending thoracic aorta measures 2.0 cm. No evidence for aortic dissection. Mediastinum/Nodes: Normal appearance of the thyroid gland. The trachea appears patent and is midline. Normal appearance of the esophagus. No enlarged supraclavicular, axillary, mediastinal, or hilar adenopathy. Lungs/Pleura: No pleural effusion. Similar appearance of ground-glass opacities within both lungs with a lower lobe predominance. Calcified granuloma identified within the left upper lobe. Small pulmonary nodule within the posterior aspect of the right midlung measures 3 mm, image 40/7. Unchanged from previous exam. Upper Abdomen: No acute abnormality noted within the upper abdomen. Musculoskeletal: Spondylosis noted within the thoracic spine. Review of the MIP images  confirms the above findings. IMPRESSION: 1. Stable ascending thoracic aortic aneurysm measuring 4.2 cm maximum diameter. Recommend annual imaging followup by CTA or MRA. This recommendation follows 2010 ACCF/AHA/AATS/ACR/ASA/SCA/SCAI/SIR/STS/SVM Guidelines for the Diagnosis and Management of Patients with Thoracic Aortic Disease. Circulation. 2010; 121: [id number] 2. 3 mm right lung nodule is unchanged from previous exam compatible with a benign abnormality. Electronically Signed   By: Kerby Moors M.D.   On: 08/19/2018 12:03   I have independently reviewed the above radiology studies  and reviewed the findings with the patient.   Ct Angio Chest Aorta W &/or Wo Contrast  Result Date: 07/24/2016 CLINICAL DATA:  Thoracic aortic aneurysm fall. Nonsmoker. History of right breast cancer with partial mastectomy. No chest complaints. EXAM: CT ANGIOGRAPHY CHEST WITH CONTRAST TECHNIQUE: Multidetector CT imaging of the chest was performed using the standard protocol during bolus administration of intravenous contrast. Multiplanar CT image reconstructions and MIPs were obtained to evaluate the vascular anatomy. CONTRAST:  75 cc of Isovue 370 IV COMPARISON:  06/29/2014 CT FINDINGS: Cardiovascular: There has been no significant interval change in the appearance of the fusiform ascending thoracic aortic aneurysm. Greatest ascending aortic dimension is 4.3 cm and along the arch, 3.3 cm. No descending thoracic aortic aneurysm. There is no aortic dissection, wall thickening or evidence of leak. Normal heart size. No pericardial effusion. Great vessels are unremarkable with normal takeoff anatomy. Mediastinum/Nodes: No enlarged mediastinal, hilar, or axillary lymph nodes. Thyroid gland, trachea, and esophagus demonstrate no significant findings. Lungs/Pleura: Bibasilar dependent atelectasis. No pneumothorax or effusion. Minimal scarring in the right middle lobe versus atelectasis. No suspicious nodules or pulmonary masses.  Upper Abdomen: Stable incidental hepatic cysts. Postsurgical changes right  breast. 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 No suspicious osseous lesions. Review of the MIP images confirms the above findings. IMPRESSION: Stable fusiform ascending aortic aneurysm measuring up to 4.3 cm maximum, stable since 2015. No acute pulmonary abnormality. Electronically Signed   By: Ashley Royalty M.D.   On: 07/24/2016 14:51       06/06/2013   CLINICAL DATA:  Neck pain with right-sided radicular symptoms  EXAM: CERVICAL SPINE  4+ VIEWS  COMPARISON:  None.  FINDINGS: Frontal, lateral, open-mouth odontoid, and bilateral oblique views were obtained. There is no fracture or spondylolisthesis. Prevertebral soft tissues and predental space regions are normal.  There is fairly marked disc space narrowing at C4-5. There is moderate disc space narrowing at C7-T1. There is mild disc space narrowing at C3-4. There is the exit foraminal narrowing at C4-5 due to the facet hypertrophy.  There is a small cervical rib on the right.  IMPRESSION: Multilevel osteoarthritic change. No fracture or spondylolisthesis. Small cervical rib on the right.   Electronically Signed   By: Lowella Grip M.D.   On: 06/06/2013 21:19   Dg Thoracic Spine W/swimmers  06/06/2013   CLINICAL DATA:  Pain with radicular symptoms  EXAM: THORACIC SPINE - 2 VIEW + SWIMMERS  COMPARISON:  None.  FINDINGS: Frontal, lateral, and swimmer's views were obtained. There is no fracture or spondylolisthesis. There the is multilevel disc space narrowing with several small osteophytes consistent with osteoarthritic change. No erosive change.  IMPRESSION: Multilevel osteoarthritic change. No fracture or spondylolisthesis.   Electronically Signed   By: Lowella Grip M.D.   On: 06/06/2013 21:20   Ct Angio Chest Aorta W/cm &/or Wo/cm  06/09/2013   CLINICAL DATA:  Aneurysm  EXAM: CT ANGIOGRAPHY CHEST WITH CONTRAST  TECHNIQUE: Multidetector CT  imaging of the chest was performed using the standard protocol during bolus administration of intravenous contrast. Multiplanar CT image reconstructions including MIPs were obtained to evaluate the vascular anatomy.  CONTRAST:  167mL OMNIPAQUE IOHEXOL 350 MG/ML SOLN  COMPARISON:  09/02/2010  FINDINGS: Maximal aortic diameter at the sinus of Valsalva, sino-tubular junction, and ascending aorta are 3.4 cm, 2.9 cm, and 4.5 cm respectively. Previously, the maximal diameter in the ascending aorta was 4.2 cm. No evidence of dissection.  Origins of the great vessels are patent. Right subclavian and common carotid arteries are patent. Visualized vertebral arteries are patent.  No obvious filling defect in the pulmonary arterial tree to suggest acute pulmonary thromboembolism.  No abnormal mediastinal adenopathy or pericardial effusion.  Scattered linear atelectasis in the lungs without consolidation or mass.  Degenerative disc disease within the thoracic spine. No compression deformity. Stable liver cyst.  Review of the MIP images confirms the above findings.  IMPRESSION: Ascending aortic aneurysm has increased from 4.2 cm to 4.5 cm in maximal diameter.   Electronically Signed   By: Maryclare Bean M.D.   On: 06/09/2013 07:39    ECHO: 03/30/2008: SUMMARY - Overall left ventricular systolic function was normal. There were no left ventricular regional wall motion abnormalities. There was moderate asymmetric septal hypertrophy. - The AV leaflets themselves appear normal with normal opening. However, there are increased velocities and gradients. I do not appreciate a subvalvular membrane and there does not appear to be LVOT obstruction from ASH. Consider TEE or cardiac CT if clinically indicated for enhanced visualization. The mean transaortic valve gradient was 11 mmHg. Estimated aortic valve area (by VTI) was 1.74 cm^2. Estimated aortic valve area (by Vmax) was  1.72 cm^2. - There appears to be a linear density in the  prox asc aorta without definitive color flow appreciated on doppler in this area. This is not seen in all views. Additionally, the prox asc aorta is mild to mod dilated in this region. Recommend TEE or cardiac/chest CT to eval for poss aneurysm and/or dissection if clinically indicated. There was moderate aortic arch dilatation. - There was mild mitral valvular regurgitation. The effective orifice of mitral regurgitation by proximal isovelocity surface area was 0.21 cm^2. The volume of mitral regurgitation by proximal isovelocity surface area was 26 cc. - The estimated peak right ventricular systolic pressure was mildly increased.   Recent Lab Findings: Lab Results  Component Value Date   WBC 3.2 (L) 10/23/2018   HGB 11.8 10/23/2018   HCT 36.0 10/23/2018   PLT 313 10/23/2018   GLUCOSE 96 05/06/2019   CHOL 168 05/06/2019   TRIG 37 05/06/2019   HDL 79 05/06/2019   LDLCALC 78 05/06/2019   ALT 14 05/06/2019   AST 22 05/06/2019   NA 143 05/06/2019   K 3.6 05/06/2019   CL 103 05/06/2019   CREATININE 0.97 (H) 05/06/2019   BUN 24 05/06/2019   CO2 34 (H) 05/06/2019   TSH 3.780 10/23/2018   HGBA1C 5.6 05/27/2014   Chronic Kidney Disease   Stage I     GFR >90  Stage II    GFR 60-89  Stage IIIA GFR 45-59  Stage IIIB GFR 30-44  Stage IV   GFR 15-29  Stage V    GFR  <15  Lab Results  Component Value Date   CREATININE 0.97 (H) 05/06/2019   CrCl cannot be calculated (Patient's most recent lab result is older than the maximum 21 days allowed.).  Aortic Size Index=   4.3    /There is no height or weight on file to calculate BSA. =2.33  < 2.75 cm/m2      4% risk per year 2.75 to 4.25          8% risk per year > 4.25 cm/m2    20% risk per year  Aortic cross sectionarea/height= 9.2  Assessment / Plan:  #1 stable fusiform ascending aortic aneurysm measuring 4.3 cm stable since 2009 #2 trileaflet aortic valve by echocardiogram in 2014 #3 history of hypertension   Plan to  see the patient back in 1 year with a follow-up CTA of the chest Avoiding strenuous lifting and avoiding quinolones has been discussed with the patient Keep blood pressure controlled  Grace Isaac MD      [address] [place] [address] Office [phone]   Beeper [phone]

## 2019-08-29 ENCOUNTER — Other Ambulatory Visit: Payer: Self-pay

## 2019-08-29 MED ORDER — CARVEDILOL 3.125 MG PO TABS: ORAL_TABLET | ORAL | 1 refills | Status: DC

## 2019-09-05 ENCOUNTER — Other Ambulatory Visit: Payer: Self-pay | Admitting: Family Medicine

## 2019-10-06 ENCOUNTER — Ambulatory Visit: Payer: PPO | Attending: Internal Medicine

## 2019-10-06 ENCOUNTER — Other Ambulatory Visit: Payer: Self-pay

## 2019-10-06 DIAGNOSIS — Z23 Encounter for immunization: Secondary | ICD-10-CM

## 2019-10-06 NOTE — Progress Notes (Signed)
   Covid-19 Vaccination Clinic  Name:  JAANVI MIZENER    MRN: [id number] DOB: [birth year]  10/06/2019  Ms. Stalling was observed post Covid-19 immunization for 15 minutes without incidence. She was provided with Vaccine Information Sheet and instruction to access the V-Safe system.   Ms. Sanandres was instructed to call 911 with any severe reactions post vaccine: Marland Kitchen Difficulty breathing  . Swelling of your face and throat  . A fast heartbeat  . A bad rash all over your body  . Dizziness and weakness    Immunizations Administered    Name Date Dose VIS Date Route   Moderna COVID-19 Vaccine 10/06/2019 10:09 AM 0.5 mL 07/26/2019 Intramuscular   Manufacturer: Moderna   Lot: [id number]   [id number]

## 2019-10-18 ENCOUNTER — Encounter: Payer: Self-pay | Admitting: Family Medicine

## 2019-10-18 ENCOUNTER — Other Ambulatory Visit: Payer: Self-pay

## 2019-10-18 ENCOUNTER — Ambulatory Visit (INDEPENDENT_AMBULATORY_CARE_PROVIDER_SITE_OTHER): Payer: PPO | Admitting: Family Medicine

## 2019-10-18 VITALS — BP 122/72 | HR 69 | Temp 97.8°F | Ht 61.5 in | Wt 192.0 lb

## 2019-10-18 DIAGNOSIS — Z1231 Encounter for screening mammogram for malignant neoplasm of breast: Secondary | ICD-10-CM | POA: Diagnosis not present

## 2019-10-18 DIAGNOSIS — E559 Vitamin D deficiency, unspecified: Secondary | ICD-10-CM

## 2019-10-18 DIAGNOSIS — R5383 Other fatigue: Secondary | ICD-10-CM

## 2019-10-18 DIAGNOSIS — Z0000 Encounter for general adult medical examination without abnormal findings: Secondary | ICD-10-CM

## 2019-10-18 DIAGNOSIS — E785 Hyperlipidemia, unspecified: Secondary | ICD-10-CM

## 2019-10-18 DIAGNOSIS — D539 Nutritional anemia, unspecified: Secondary | ICD-10-CM | POA: Diagnosis not present

## 2019-10-18 DIAGNOSIS — I10 Essential (primary) hypertension: Secondary | ICD-10-CM

## 2019-10-18 DIAGNOSIS — E669 Obesity, unspecified: Secondary | ICD-10-CM

## 2019-10-18 NOTE — Patient Instructions (Addendum)
F/U in 4.5 months,in office with MD, call if you need me before  Please schedule mammogram at checkout  Appointment with Cardiology due in April , please schedule   Labs today, CBC, lipid, cmp and EGFr, TSH and vit D  Weight loss goal of 8 pounds  If you do snore let me know to refer you for sleep study It is important that you exercise regularly at least 30 minutes 5 times a week. If you develop chest pain, have severe difficulty breathing, or feel very tired, stop exercising immediately and seek medical attention    Think about what you will eat, plan ahead. Choose " clean, green, fresh or frozen" over canned, processed or packaged foods which are more sugary, salty and fatty. 70 to 75% of food eaten should be vegetables and fruit. Three meals at set times with snacks allowed between meals, but they must be fruit or vegetables. Aim to eat over a 12 hour period , example 7 am to 7 pm, and STOP after  your last meal of the day. Drink water,generally about 64 ounces per day, no other drink is as healthy. Fruit juice is best enjoyed in a healthy way, by EATING the fruit.

## 2019-10-18 NOTE — Assessment & Plan Note (Signed)

## 2019-10-18 NOTE — Assessment & Plan Note (Signed)
  Patient re-educated about  the importance of commitment to a  minimum of 150 minutes of exercise per week as able.  The importance of healthy food choices with portion control discussed, as well as eating regularly and within a 12 hour window most days. The need to choose "clean , green" food 50 to 75% of the time is discussed, as well as to make water the primary drink and set a goal of 64 ounces water daily.    Weight /BMI 10/18/2019 07/14/2019 06/16/2019  WEIGHT 192 lb 184 lb 190 lb  HEIGHT 5' 1.5" 5\' 2"  5\' 2"   BMI 35.69 kg/m2 33.65 kg/m2 34.75 kg/m2

## 2019-10-18 NOTE — Progress Notes (Signed)
    Desiree [name]     MRN: [id number]      DOB: Apr 05, 1946  HPI: Patient is in for annual physical exam. C/o chronic fatigue, denies depression and screen is negative. Denies snoring or excessive daytime sleepiness Recent labs, if available are reviewed. Immunization is reviewed , and  updated if needed.   PE: BP 122/72 (BP Location: Right Arm, Patient Position: Sitting)   Pulse 69   Temp 97.8 F (36.6 C) (Temporal)   Ht 5' 1.5" (1.562 m)   Wt 192 lb (87.1 kg)   SpO2 98%   BMI 35.69 kg/m   Pleasant  female, alert and oriented x 3, in no cardio-pulmonary distress. Afebrile. HEENT No facial trauma or asymetry. Sinuses non tender.  Extra occullar muscles intact.. External ears normal, . Neck: supple, no adenopathy,JVD or thyromegaly.No bruits.  Chest: Clear to ascultation bilaterally.No crackles or wheezes. Non tender to palpation  Breast: No asymetry,no masses or lumps. No tenderness. No nipple discharge or inversion. No axillary or supraclavicular adenopathy  Cardiovascular system; Heart sounds normal,  S1 and  S2 ,no S3.  No murmur, or thrill. Apical beat not displaced Peripheral pulses normal.  Abdomen: Soft, non tender, no organomegaly or masses. No bruits. Bowel sounds normal. No guarding, tenderness or rebound.   GU: No exam indicated and no exam done   Musculoskeletal exam: Adequate though slightly reduced  ROM of spine, hips , shoulders and knees. No deformity ,swelling or crepitus noted. No muscle wasting or atrophy.   Neurologic: Cranial nerves 2 to 12 intact. Power, tone ,sensation and reflexes normal throughout. No disturbance in gait. No tremor.  Skin: Intact, no ulceration, erythema , scaling or rash noted. Pigmentation normal throughout  Psych; Normal mood and affect. Judgement and concentration normal   Assessment & Plan:  Annual physical exam Annual exam as documented. Counseling done  re healthy lifestyle involving commitment  to 150 minutes exercise per week, heart healthy diet, and attaining healthy weight.The importance of adequate sleep also discussed. Regular seat belt use and home safety, is also discussed. Changes in health habits are decided on by the patient with goals and time frames  set for achieving them. Immunization and cancer screening needs are specifically addressed at this visit.   Obesity (BMI 30-39.9)  Patient re-educated about  the importance of commitment to a  minimum of 150 minutes of exercise per week as able.  The importance of healthy food choices with portion control discussed, as well as eating regularly and within a 12 hour window most days. The need to choose "clean , green" food 50 to 75% of the time is discussed, as well as to make water the primary drink and set a goal of 64 ounces water daily.    Weight /BMI 10/18/2019 07/14/2019 06/16/2019  WEIGHT 192 lb 184 lb 190 lb  HEIGHT 5' 1.5" 5\' 2"  5\' 2"   BMI 35.69 kg/m2 33.65 kg/m2 34.75 kg/m2

## 2019-10-19 LAB — CBC
HCT: 32.6 % — ABNORMAL LOW (ref 35.0–45.0)
Hemoglobin: 10.7 g/dL — ABNORMAL LOW (ref 11.7–15.5)
MCH: 28.6 pg (ref 27.0–33.0)
MCHC: 32.8 g/dL (ref 32.0–36.0)
MCV: 87.2 fL (ref 80.0–100.0)
MPV: 9.4 fL (ref 7.5–12.5)
Platelets: 297 10*3/uL (ref 140–400)
RBC: 3.74 10*6/uL — ABNORMAL LOW (ref 3.80–5.10)
RDW: 13 % (ref 11.0–15.0)
WBC: 3.8 10*3/uL (ref 3.8–10.8)

## 2019-10-19 LAB — TEST AUTHORIZATION

## 2019-10-19 LAB — COMPLETE METABOLIC PANEL WITH GFR
AG Ratio: 1.3 (calc) (ref 1.0–2.5)
ALT: 12 U/L (ref 6–29)
AST: 20 U/L (ref 10–35)
Albumin: 3.9 g/dL (ref 3.6–5.1)
Alkaline phosphatase (APISO): 72 U/L (ref 37–153)
BUN/Creatinine Ratio: 16 (calc) (ref 6–22)
BUN: 17 mg/dL (ref 7–25)
CO2: 32 mmol/L (ref 20–32)
Calcium: 9.4 mg/dL (ref 8.6–10.4)
Chloride: 101 mmol/L (ref 98–110)
Creat: 1.05 mg/dL — ABNORMAL HIGH (ref 0.60–0.93)
GFR, Est African American: 61 mL/min/{1.73_m2} (ref >=60)
GFR, Est Non African American: 53 mL/min/{1.73_m2} — ABNORMAL LOW (ref >=60)
Globulin: 3 g/dL (calc) (ref 1.9–3.7)
Glucose, Bld: 102 mg/dL — ABNORMAL HIGH (ref 65–99)
Potassium: 3.2 mmol/L — ABNORMAL LOW (ref 3.5–5.3)
Sodium: 141 mmol/L (ref 135–146)
Total Bilirubin: 0.5 mg/dL (ref 0.2–1.2)
Total Protein: 6.9 g/dL (ref 6.1–8.1)

## 2019-10-19 LAB — LIPID PANEL
Cholesterol: 190 mg/dL (ref <200)
HDL: 80 mg/dL (ref >=50)
LDL Cholesterol (Calc): 96 mg/dL (calc)
Non-HDL Cholesterol (Calc): 110 mg/dL (calc) (ref <130)
Total CHOL/HDL Ratio: 2.4 (calc) (ref <5.0)
Triglycerides: 59 mg/dL (ref <150)

## 2019-10-19 LAB — VITAMIN D 25 HYDROXY (VIT D DEFICIENCY, FRACTURES): Vit D, 25-Hydroxy: 24 ng/mL — ABNORMAL LOW (ref 30–100)

## 2019-10-19 LAB — TSH: TSH: 3.36 mIU/L (ref 0.40–4.50)

## 2019-10-19 LAB — IRON: Iron: 55 ug/dL (ref 45–160)

## 2019-10-19 LAB — FERRITIN: Ferritin: 192 ng/mL (ref 16–288)

## 2019-10-23 ENCOUNTER — Encounter: Payer: Self-pay | Admitting: Family Medicine

## 2019-10-23 MED ORDER — FERROUS SULFATE 325 (65 FE) MG PO TABS: 325.0000 mg | ORAL_TABLET | Freq: Every day | ORAL | 4 refills | Status: DC

## 2019-10-23 MED ORDER — POTASSIUM CHLORIDE ER 10 MEQ PO TBCR: 10.0000 meq | EXTENDED_RELEASE_TABLET | Freq: Every day | ORAL | 5 refills | Status: DC

## 2019-10-25 ENCOUNTER — Other Ambulatory Visit: Payer: Self-pay

## 2019-10-25 ENCOUNTER — Other Ambulatory Visit: Payer: Self-pay | Admitting: Cardiovascular Disease

## 2019-10-27 ENCOUNTER — Ambulatory Visit (INDEPENDENT_AMBULATORY_CARE_PROVIDER_SITE_OTHER): Payer: PPO | Admitting: Orthopaedic Surgery

## 2019-10-27 ENCOUNTER — Encounter: Payer: Self-pay | Admitting: Orthopaedic Surgery

## 2019-10-27 ENCOUNTER — Ambulatory Visit (INDEPENDENT_AMBULATORY_CARE_PROVIDER_SITE_OTHER): Payer: PPO

## 2019-10-27 VITALS — Ht 61.0 in | Wt 183.0 lb

## 2019-10-27 DIAGNOSIS — G8929 Other chronic pain: Secondary | ICD-10-CM

## 2019-10-27 DIAGNOSIS — M25561 Pain in right knee: Secondary | ICD-10-CM | POA: Diagnosis not present

## 2019-10-27 DIAGNOSIS — M1711 Unilateral primary osteoarthritis, right knee: Secondary | ICD-10-CM | POA: Diagnosis not present

## 2019-10-27 NOTE — Progress Notes (Signed)
Office Visit Note   Patient: BRITTLEY SOBERS           Date of Birth: 11/16/1945           MRN: [id number] Visit Date: 10/27/2019              Requested by: [name], [address], [place],  [place] [address] PCP: [name], MD   Assessment & Plan: Visit Diagnoses:  1. Chronic pain of right knee   2. Unilateral primary osteoarthritis, right knee     Plan: Patient states she is ready to proceed with right total knee arthroplasty.  She will need cardiac clearance with her past history.  Procedure discussed questions elicited and answered likely 1-2 night stay in the hospital.  We discussed postoperative therapy spinal anesthesia.  She understands and requests we proceed.  Follow-Up Instructions: No follow-ups on file.   Orders:  Orders Placed This Encounter  Procedures  . XR KNEE 3 VIEW RIGHT   No orders of the defined types were placed in this encounter.     Procedures: No procedures performed   Clinical Data: No additional findings.   Subjective: Chief Complaint  Patient presents with  . Right Knee - Pain    HPI 74 year old female with bilateral knee osteoarthritis worse on the right than left knee with progressive pain swelling catching difficulty walking problems sleeping problems with activities of daily living.  Her x-rays show loss of joint space marginal osteophyte formation mild valgus less than 15 degrees and tricompartmental degenerative changes.  She has been followed for the last 2 years previous injections in her knee and states she now is ready to proceed with right total knee arthroplasty.  Review of Systems positive for bilateral knee osteoarthritis right greater than left.  History of breast cancer without chemotherapy.  Allergic rhinitis.  History of thoracic aneurysm.  Metabolic syndrome X urinary incontinence.  Stage III kidney disease.  Left ventricular outflow tract obstruction.  Thoracic aneurysm imaging CT 2024.4  cm a sending aorta reviewed by Dr. [name] thoracic surgeon.  Dr.Margaret  Moshe Cipro is her primary care physician.   Objective: Vital Signs: Ht 5\' 1"  (1.549 m)   Wt 183 lb (83 kg)   BMI 34.58 kg/m   Physical Exam Constitutional:      Appearance: She is well-developed.  HENT:     Head: Normocephalic.     Right Ear: External ear normal.     Left Ear: External ear normal.  Eyes:     Pupils: Pupils are equal, round, and reactive to light.  Neck:     Thyroid: No thyromegaly.     Trachea: No tracheal deviation.  Cardiovascular:     Rate and Rhythm: Normal rate.  Pulmonary:     Effort: Pulmonary effort is normal.  Abdominal:     Palpations: Abdomen is soft.  Skin:    General: Skin is warm and dry.  Neurological:     Mental Status: She is alert and oriented to person, place, and time.  Psychiatric:        Behavior: Behavior normal.     Ortho Exam patient reaches none few degrees full extension right knee flexes to 100 degrees.  There is 2+ knee effusion crepitus with knee range of motion medial lateral joint line tenderness positive patellar grind test collateral ligaments are stable.  Distal pulses intact no sciatic notch tenderness normal hip range of motion.  Opposite left knee shows trace  effusion and less severe crepitus with motion.  Specialty Comments:  No specialty comments available.  Imaging: Three-view x-rays right knee standing AP both knees lateral right knee and sunrise patellar x-ray shows right greater than left knee osteoarthritis with progression from 2019 images.  Mild valgus right knee.  No acute bony injury noted.  Impression: Moderate to severe right knee osteoarthritis with less severe left knee arthritis.    PMFS History: Patient Active Problem List   Diagnosis Date Noted  . Unilateral primary osteoarthritis, right knee 10/30/2019  . Fatigue 10/18/2019  . Left shoulder pain 04/11/2019  . Ganglion cyst of volar aspect of left wrist  10/05/2018  . Fall at home, subsequent encounter 06/07/2018  . Knee pain, right 05/31/2017  . CKD (chronic kidney disease) stage 3, GFR 30-59 ml/min 10/01/2016  . Neck pain on left side 01/01/2016  . Annual physical exam 02/15/2015  . 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 Urinary incontinence 05/22/2014  . Metabolic syndrome X [id number]  . Osteopenia 12/12/2013  . Anemia 11/29/2013  . Obesity (BMI 30-39.9) 10/17/2012  . Thoracic aortic aneurysm ([facility]) 10/12/2012  . Allergic rhinitis 03/02/2012  . BLADDER PROLAPSE 09/24/2010  . NEOPLASM, MALIGNANT, BREAST, RIGHT 02/04/2008  . Hyperlipidemia LDL goal <100 02/04/2008  . Essential hypertension 02/04/2008   Past Medical History:  Diagnosis Date  . Allergic rhinitis   . Arthritis HIPS/ WRISTS  . DDD (degenerative disc disease), cervical   . GERD (gastroesophageal reflux disease) OCCASIONAL  . Heart murmur ASYMPTOMATIC  . History of Bell's palsy 2011  RIGHT SIDE -- RESOLVED  . 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 History of CVA (cerebrovascular accident) PER SCAN IN 2011  . Hyperlipidemia   . Hypertension   . Impaired fasting glucose PER PCP  DR SIMPSON   WATCH DIET  . Mixed urge and stress incontinence   . Nocturia   . Sinus drainage   . Thoracic ascending aortic aneurysm ([place]) LAST CHEST CT 09-02-2010--  FOLLOWED BY  CARDIOLOGIST--  DR [id number]  . Vaginal wall prolapse     Family History  Problem Relation Age of Onset  . Kidney failure Mother   . Hypertension Mother   . Heart attack Mother 62  . Throat cancer Father   . Prostate cancer Father   . Lung cancer Father 84  . Breast cancer Sister   . Hypertension Brother        2 stents  . Colon cancer Neg Hx   . Colon polyps Neg Hx     Past Surgical History:  Procedure Laterality Date  . BILATERAL CARPAL TUNNEL RELEASE  1980'S  . CARDIAC CATHETERIZATION  02-01-2009  DR EICHHORN    NORMAL CORONARY ARTERIES/ NORMAL LV SIZE AND FUNCTION/ AORTIC ROOT SIZE AT THE UPPER LIMIT OF NORMAL   . CARPAL TUNNEL RELEASE    . CERVICAL DISC SURGERY  12-12-2005  DR Vertell Limber   LEFT  C6 - C7 HERINATED / DDD/ SPONDYLOSIS  . COLONOSCOPY N/A 02/10/2014   Procedure: COLONOSCOPY;  Surgeon: Danie Binder, MD;  [facility];  Service: Endoscopy;  Laterality: N/A;  8:30  . CYSTOCELE REPAIR  08/02/2012   Procedure: ANTERIOR REPAIR (CYSTOCELE);  Surgeon: Ailene Rud, MD;  [facility];  Service: Urology;  Laterality: N/A;  [employer] Uphold Anterior Pelvic Floor Sacrospinus Repair. Anterior wall of the vagina.  . ESOPHAGOGASTRODUODENOSCOPY N/A 02/10/2014  Procedure: ESOPHAGOGASTRODUODENOSCOPY (EGD);  Surgeon: Danie Binder, MD;  Location[facility];  Service: Endoscopy;  Laterality: N/A;  . GIVENS CAPSULE STUDY N/A 03/13/2014   Procedure: GIVENS CAPSULE STUDY;  Surgeon: Danie Binder, MD;  Location: [facility];  Service: Endoscopy;  Laterality: N/A;  7:30  . NECK SURGERY  2007  . RIGHT PARTIAL MASTECTOMY  11-11-2004  DR [name] YOUNG   DUCTAL CARCINOMA IN SITU RIGHT BREAST  . TOTAL ABDOMINAL HYSTERECTOMY W/ BILATERAL SALPINGOOPHORECTOMY  1990  . TRANSTHORACIC ECHOCARDIOGRAM  03-30-2008  DR MARGARET SIMPSON   LV SIZE AND FUNCTION NORMAL/ MODERATE AORTIC ARCH DILATATION/ MILD MR   Social History   Occupational History  . Not on file  Tobacco Use  . Smoking status: Never Smoker  . Smokeless tobacco: Never Used  Substance and Sexual Activity  . Alcohol use: No  . Drug use: No  . Sexual activity: Not Currently

## 2019-10-30 DIAGNOSIS — M1711 Unilateral primary osteoarthritis, right knee: Secondary | ICD-10-CM | POA: Insufficient documentation

## 2019-11-03 ENCOUNTER — Telehealth: Payer: Self-pay | Admitting: *Deleted

## 2019-11-03 NOTE — Telephone Encounter (Signed)
Spoke with pt who is agreeable to see Robbie Lis, PA-C on 11-23-19 at 11:15 am for per-op clearance. Pt informed me that the requesting surgeon's office is going to try and do the surgery in April some time. Pt thank you me for the call.   I will fax over recommendations to requesting surgeon's office.

## 2019-11-03 NOTE — Telephone Encounter (Signed)
   [facility] Pre-operative Risk Assessment    Request for surgical clearance:  1. What type of surgery is being performed? RIGHT TOTAL KNEE   2. When is this surgery scheduled?  TBD   3. What type of clearance is required (medical clearance vs. Pharmacy clearance to hold med vs. Both)?  MEDICAL  4. Are there any medications that need to be held prior to surgery and how long?ASA   5. Practice name and name of physician performing surgery?  [facility]   6. What is your office phone number [phone]    7.   What is your office fax number [phone]  8.   Anesthesia type (None, local, MAC, general) ?  SPINAL   Devra Dopp 11/03/2019, 8:09 AM  _________________________________________________________________   (provider comments below)

## 2019-11-03 NOTE — Telephone Encounter (Signed)
   Primary Cardiologist:Mihai Croitoru, MD  Chart reviewed as part of pre-operative protocol coverage. Because of VASHTI REDINGTON past medical history and time since last visit, he/she will require a follow-up visit in order to better assess preoperative cardiovascular risk.  She was last seen 11/2018.   Pre-op covering staff: - Please schedule appointment and call patient to inform them. - Please contact requesting surgeon's office via preferred method (i.e, phone, fax) to inform them of need for appointment prior to surgery.    Daune Perch, NP  11/03/2019, 10:16 AM

## 2019-11-07 ENCOUNTER — Ambulatory Visit: Payer: PPO | Attending: Internal Medicine

## 2019-11-07 DIAGNOSIS — Z23 Encounter for immunization: Secondary | ICD-10-CM

## 2019-11-07 NOTE — Progress Notes (Signed)
   Covid-19 Vaccination Clinic  Name:  Desiree [name]    MRN: [id number] DOB: Jul 03, 1946  11/07/2019  [name] was observed post Covid-19 immunization for 15 minutes without incident. She was provided with Vaccine Information Sheet and instruction to access the V-Safe system.   [name] was instructed to call 911 with any severe reactions post vaccine: Marland Kitchen Difficulty breathing  . Swelling of face and throat  . A fast heartbeat  . A bad rash all over body  . Dizziness and weakness   Immunizations Administered    Name Date Dose VIS Date Route   Moderna COVID-19 Vaccine 11/07/2019  9:58 AM 0.5 mL 07/26/2019 Intramuscular   Manufacturer: Moderna   Lot: [id number]   [id number]

## 2019-11-09 ENCOUNTER — Other Ambulatory Visit: Payer: Self-pay | Admitting: Family Medicine

## 2019-11-10 ENCOUNTER — Other Ambulatory Visit: Payer: Self-pay

## 2019-11-10 ENCOUNTER — Other Ambulatory Visit (HOSPITAL_COMMUNITY)
Admission: RE | Admit: 2019-11-10 | Discharge: 2019-11-10 | Disposition: A | Discharge status: Home / Self Care | Payer: PPO | Attending: Family Medicine | Admitting: Family Medicine

## 2019-11-10 ENCOUNTER — Ambulatory Visit (HOSPITAL_COMMUNITY)
Admission: RE | Admit: 2019-11-10 | Discharge: 2019-11-10 | Disposition: A | Discharge status: Home / Self Care | Payer: PPO | Source: Ambulatory Visit | Attending: Family Medicine | Admitting: Family Medicine

## 2019-11-10 ENCOUNTER — Ambulatory Visit (INDEPENDENT_AMBULATORY_CARE_PROVIDER_SITE_OTHER): Payer: PPO | Admitting: Family Medicine

## 2019-11-10 ENCOUNTER — Encounter: Payer: Self-pay | Admitting: Family Medicine

## 2019-11-10 VITALS — BP 124/80 | HR 76 | Temp 96.9°F | Resp 16 | Ht 61.5 in | Wt 191.0 lb

## 2019-11-10 DIAGNOSIS — Z01818 Encounter for other preprocedural examination: Secondary | ICD-10-CM | POA: Insufficient documentation

## 2019-11-10 DIAGNOSIS — D508 Other iron deficiency anemias: Secondary | ICD-10-CM | POA: Diagnosis not present

## 2019-11-10 DIAGNOSIS — N3001 Acute cystitis with hematuria: Secondary | ICD-10-CM | POA: Diagnosis not present

## 2019-11-10 DIAGNOSIS — I10 Essential (primary) hypertension: Secondary | ICD-10-CM

## 2019-11-10 DIAGNOSIS — J9811 Atelectasis: Secondary | ICD-10-CM | POA: Diagnosis not present

## 2019-11-10 DIAGNOSIS — K5900 Constipation, unspecified: Secondary | ICD-10-CM | POA: Insufficient documentation

## 2019-11-10 LAB — POCT URINALYSIS DIP (CLINITEK)
Bilirubin, UA: NEGATIVE
Glucose, UA: NEGATIVE mg/dL
Ketones, POC UA: NEGATIVE mg/dL
Nitrite, UA: NEGATIVE
POC PROTEIN,UA: NEGATIVE
Spec Grav, UA: 1.01 (ref 1.010–1.025)
Urobilinogen, UA: 0.2 E.U./dL
pH, UA: 5 (ref 5.0–8.0)

## 2019-11-10 MED ORDER — SLOW FE 142 (45 FE) MG PO TBCR: 142.0000 mg | EXTENDED_RELEASE_TABLET | Freq: Every day | ORAL | 1 refills | Status: DC

## 2019-11-10 NOTE — Assessment & Plan Note (Signed)
Changing to slow FE and adding colace to help with constipation. Needs labs before sx to see if levels of Hgb have improved

## 2019-11-10 NOTE — Assessment & Plan Note (Signed)
Desiree [name] is encouraged to maintain a well balanced diet that is low in salt. Controlled, continue current medication regimen.IS followed by Cards Additionally, she is also reminded that exercise is beneficial for heart health and control of  Blood pressure. 30-60 minutes daily is recommended-walking was suggested.

## 2019-11-10 NOTE — Assessment & Plan Note (Signed)
Pre Op Urine + for [facility] will send out to check for infection and treat if needed.

## 2019-11-10 NOTE — Patient Instructions (Addendum)
I appreciate the opportunity to provide you with care for your health and wellness. Today we discussed: pre op clearance  Follow up: 03/07/2020  No labs today-but might need repeat closer to surgery date  Chest xray within next week.  Heart Dr Clearance next  We will send out urine to check for infection. We will treat you if if comes back positive.  Please get slow FE (iron) and colace from pharmacy. Take these daily together with britain C   Please continue to practice social distancing to keep you, your family, and our community safe.  If you must go out, please wear a mask and practice good handwashing.  It was a pleasure to see you and I look forward to continuing to work together on your health and well-being. Please do not hesitate to call the office if you need care or have questions about your care.  Have a wonderful day and week. With Gratitude, [name], DNP, AGNP-BC

## 2019-11-10 NOTE — Assessment & Plan Note (Signed)
Changed to slow FE and added Colace.

## 2019-11-10 NOTE — Progress Notes (Signed)
Subjective:  Patient ID: Desiree [name], female    DOB: [birth year]  Age: 74 y.o. MRN: [id number]  CC:  Chief Complaint  Patient presents with  . Pre-op Exam    total knee replacement   . Constipation      HPI  HPI  Desiree [name] is a 74 year old female patient of Dr. Griffin Dakin.  Who presents today for preop examination for a total knee replacement on the right side.  She will have spinal anesthesia.  Dr. [name] will be the provider who performed the procedure.  Procedure is predicted to be in April at this time.  Only concern today is that she has constipation from starting the iron she is having trouble taking it.  Labs in February demonstrated a low hemoglobin in the 10 range and she was started on iron for supplementation.  She is willing to start a stool softener and starts Slow Fe to see if that will help.  Of note on right knee.  She was diagnosed with bilateral knee osteoarthritis, right side is greater than left side.  Has become worse over the last year especially with sitting and at night.  Is throbbing constantly.  Walking and sitting make it feel worse.  Ibuprofen helps some but not enough.  Comes and goes can intensify it time.  Range of 5-9 on pain scale out of 10.  She has been followed for the last 2 years by Dr. [name].  Previous injections in her knee have no longer helped and they report she is now ready to proceed with the total arthroplasty.  Today patient denies signs and symptoms of COVID 19 infection including fever, chills, cough, shortness of breath, and headache. Past Medical, Surgical, Social History, Allergies, and Medications have been Reviewed.   Past Medical History:  Diagnosis Date  . Allergic rhinitis   . Arthritis HIPS/ WRISTS  . DDD (degenerative disc disease), cervical   . GERD (gastroesophageal reflux disease) OCCASIONAL  . Heart murmur ASYMPTOMATIC  . History of Bell's palsy 2011  RIGHT SIDE -- RESOLVED  . History of breast cancer DX DUCTAL  CARCINOMA IN SITU--  S/P  RIGHT MASTECTMOY AND RADIATION--  NO RECURRANCE  . History of CVA (cerebrovascular accident) PER SCAN IN 2011  . Hyperlipidemia   . Hypertension   . Impaired fasting glucose PER PCP  DR SIMPSON   WATCH DIET  . Mixed urge and stress incontinence   . Nocturia   . Sinus drainage   . Thoracic ascending aortic aneurysm ([place]) LAST CHEST CT 09-02-2010--  FOLLOWED BY  CARDIOLOGIST--  DR [id number]  . Vaginal wall prolapse     Current Meds  Medication Sig  . acetaminophen (TYLENOL) 650 MG CR tablet Take 650 mg by mouth every 8 (eight) hours as needed for pain.  . Ascorbic Acid (VITAMIN C) 1000 MG tablet Take 1,000 mg by mouth daily.  Marland Kitchen aspirin EC 325 MG tablet Take 1 tablet (325 mg total) by mouth daily.  . betamethasone dipropionate (DIPROLENE) 0.05 % cream Apply sparingly two times daily for 10 days, then as needed, to affected areas( hairline, neck)  . calcium-vitamin D (OSCAL 500/200 D-3) 500-200 MG-UNIT per tablet Take 1 tablet by mouth daily with breakfast.   . carvedilol (COREG) 3.125 MG tablet TAKE 1 TABLET(3.125 MG) BY MOUTH TWICE DAILY WITH A MEAL  . Cetirizine HCl 10 MG TBDP Take 1 tablet by mouth daily.  . cholecalciferol (VITAMIN D) 1000 UNITS tablet Take 1,000 Units by mouth  daily.  . gabapentin (NEURONTIN) 100 MG capsule TAKE TWO CAPSULES BY MOUTH ONCE DAILY AT BEDTIME  . hydrochlorothiazide (HYDRODIURIL) 25 MG tablet TAKE 1 TABLET BY MOUTH ONCE DAILY IN THE MORNING  . ibuprofen (ADVIL) 800 MG tablet Take 1 tablet (800 mg total) by mouth every 8 (eight) hours as needed.  Marland Kitchen losartan (COZAAR) 50 MG tablet Take 1/2 (one-half) tablet by mouth once daily  . lovastatin (MEVACOR) 40 MG tablet Take 1 tablet by mouth once daily with breakfast  . montelukast (SINGULAIR) 10 MG tablet TAKE 1 TABLET BY MOUTH AT BEDTIME  . Multiple Vitamin (MULTIVITAMIN) tablet Take 1 tablet by mouth daily.  . pantoprazole (PROTONIX) 40 MG tablet Take 1 tablet (40 mg total) by mouth  daily.  . potassium chloride (KLOR-CON) 10 MEQ tablet Take 1 tablet (10 mEq total) by mouth daily.  . solifenacin (VESICARE) 10 MG tablet Take 10 mg by mouth daily.  . [DISCONTINUED] benzonatate (TESSALON) 100 MG capsule Take 1 capsule (100 mg total) by mouth 2 (two) times daily as needed for cough.  . [DISCONTINUED] ferrous sulfate 325 (65 FE) MG tablet Take 1 tablet (325 mg total) by mouth daily with breakfast.    ROS:  Review of Systems  HENT: Negative.   Eyes: Negative.   Respiratory: Negative.   Cardiovascular: Negative.   Gastrointestinal: Negative.   Genitourinary: Negative.   Musculoskeletal:       Pre op clearance for total knee   Skin: Negative.   Neurological: Negative.   Endo/Heme/Allergies: Negative.   Psychiatric/Behavioral: Negative.   All other systems reviewed and are negative.    Objective:   Today's Vitals: BP 124/80   Pulse 76   Temp (!) 96.9 F (36.1 C) (Temporal)   Resp 16   Ht 5' 1.5" (1.562 m)   Wt 191 lb (86.6 kg)   SpO2 98%   BMI 35.50 kg/m  Vitals with BMI 11/10/2019 10/27/2019 10/18/2019  Height 5' 1.5" 5\' 1"  5' 1.5"  Weight 191 lbs 183 lbs 192 lbs  BMI 35.51 [id number]  Systolic [id number] - [id number]  Diastolic 80 - 72  Pulse 76 - 69     Physical Exam Vitals and nursing note reviewed.  Constitutional:      Appearance: Normal appearance. She is obese.  HENT:     Head: Normocephalic and atraumatic.     Right Ear: External ear normal.     Left Ear: External ear normal.     Mouth/Throat:     Comments: Mask in place Eyes:     General:        Right eye: No discharge.        Left eye: No discharge.     Conjunctiva/sclera: Conjunctivae normal.  Cardiovascular:     Rate and Rhythm: Normal rate and regular rhythm.     Pulses: Normal pulses.     Heart sounds: Normal heart sounds.  Pulmonary:     Effort: Pulmonary effort is normal.     Breath sounds: Normal breath sounds.  Musculoskeletal:        General: Normal range of motion.     Cervical back:  Normal range of motion and neck supple.     Comments: Kasandra Knudsen present   Skin:    General: Skin is warm.  Neurological:     General: No focal deficit present.     Mental Status: She is alert and oriented to person, place, and time.  Psychiatric:        Mood  and Affect: Mood normal.        Behavior: Behavior normal.        Thought Content: Thought content normal.        Judgment: Judgment normal.     Assessment   1. Pre-operative clearance   2. Other iron deficiency anemia   3. Acute cystitis with hematuria   4. Constipation, unspecified constipation type   5. Essential hypertension     Tests ordered Orders Placed This Encounter  Procedures  . Urine Culture  . DG Chest 2 View  . POCT URINALYSIS DIP (CLINITEK)     Plan: Please see assessment and plan per problem list above.   Meds ordered this encounter  Medications  . Ferrous Sulfate (SLOW FE) 142 (45 Fe) MG TBCR    Sig: Take 142 mg by mouth daily.    Dispense:  30 tablet    Refill:  1    Order Specific Question:   Supervising Provider    Answer:   [name] [id number]    Patient to follow-up in as scheduled .  Perlie Mayo, NP

## 2019-11-10 NOTE — Assessment & Plan Note (Signed)
Here today for preop clearance. Chest x-ray ordered-denies have any exposure to Covid recently has had vaccines Blood work in February demonstrated low hemoglobin was started on iron changed to slow iron and added with stool softener needs to have recheck prior to procedure. EKG demonstrated a change in QT otherwise normal sinus rhythm has follow-up with cardiology soon.  For clearance cardiac wise. Urine demonstrated some WBCs will be sending out to make sure there is no infection and will treat appropriately. Post findings from the above we will fax over the paperwork for clearance. Physical exam is negative/unremarkable

## 2019-11-12 LAB — URINE CULTURE: Culture: 80000 — AB

## 2019-11-15 ENCOUNTER — Encounter: Payer: Self-pay | Admitting: Family Medicine

## 2019-11-15 ENCOUNTER — Other Ambulatory Visit: Payer: Self-pay | Admitting: Family Medicine

## 2019-11-15 DIAGNOSIS — N3001 Acute cystitis with hematuria: Secondary | ICD-10-CM

## 2019-11-15 MED ORDER — NITROFURANTOIN MONOHYD MACRO 100 MG PO CAPS: 100.0000 mg | ORAL_CAPSULE | Freq: Two times a day (BID) | ORAL | 0 refills | Status: AC

## 2019-11-18 ENCOUNTER — Telehealth: Payer: Self-pay | Admitting: Physician Assistant

## 2019-11-18 NOTE — Telephone Encounter (Signed)
New message  Patient is calling in to get approval for her daughter Suanne Marker to accompany her to the appointment on 11/23/19 with [facility]. Patient states she has a hard time with memory and remembering what the doctor says and will need her daughter at the appointment with her. Please give patient a call back to confirm.

## 2019-11-18 NOTE — Telephone Encounter (Signed)
Returned call back to the pt and has been advised that unfortunately with the visitor restrictions and she doesn't require any assistance of any kind, that her daughter wouldn't be able to attend her appt. I did advise her that she can call her right before the provider comes in and have her on speaker / video. She advised that she understood and thanked me for calling her back.

## 2019-11-20 ENCOUNTER — Other Ambulatory Visit: Payer: Self-pay | Admitting: Family Medicine

## 2019-11-22 NOTE — Progress Notes (Addendum)
Cardiology Office Note    Date:  11/23/2019   ID:  Divija, Troutt 1946/02/11, MRN [id number]  PCP:  [name], MD  Cardiologist: Dr. [name]   Chief Complaint: surgical clearance for R total knee  History of Present Illness:   Desiree [name] is a 74 y.o. female with longstanding hypertension, moderate and stable in size aneurysm of the ascending aorta (size unchanged since 2009, 4.2-4.3 cm, last CT December 2020, Dr. Servando Snare), CKD stage III (follow by Dr. Lowanda Foster), hypercholesterolemia, and remote history of ischemic right hemispheric stroke presents for surgical clearance.   Hx of intermittent LVOT obstruction on echo over years. Mild bradycardia previously but Dr. [name] recommended continuation of BB for aneurysm in the mild LVOT obstruction.   Last seen by Dr. [name] 11/2018.   4.2 cm ascending thoracic aorta by CT of the chest December 2020.  Here for surgical clearance.  Limited activity due to knee pain.  She is able to walk around and do household stuff without any issue.  Denies chest pain, shortness of breath, orthopnea, PND, syncope, lower extremity edema or melena.  Compliant with her medication.  Past Medical History:  Diagnosis Date  . Allergic rhinitis   . Arthritis HIPS/ WRISTS  . DDD (degenerative disc disease), cervical   . GERD (gastroesophageal reflux disease) OCCASIONAL  . Heart murmur ASYMPTOMATIC  . History of Bell's palsy 2011  RIGHT SIDE -- RESOLVED  . 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 History of CVA (cerebrovascular accident) PER SCAN IN 2011  . Hyperlipidemia   . Hypertension   . Impaired fasting glucose PER PCP  DR SIMPSON   WATCH DIET  . Mixed urge and stress incontinence   . Nocturia   . Sinus drainage   . Thoracic ascending aortic aneurysm ([place]) LAST CHEST CT 09-02-2010--  FOLLOWED BY  CARDIOLOGIST--  DR [id number]  . Vaginal wall prolapse      Past Surgical History:  Procedure Laterality Date  . BILATERAL CARPAL TUNNEL RELEASE  1980'S  . CARDIAC CATHETERIZATION  02-01-2009  DR EICHHORN   NORMAL CORONARY ARTERIES/ NORMAL LV SIZE AND FUNCTION/ AORTIC ROOT SIZE AT THE UPPER LIMIT OF NORMAL   . CARPAL TUNNEL RELEASE    . CERVICAL DISC SURGERY  12-12-2005  DR Vertell Limber   LEFT  C6 - C7 HERINATED / DDD/ SPONDYLOSIS  . COLONOSCOPY N/A 02/10/2014   Procedure: COLONOSCOPY;  Surgeon: Danie Binder, MD;  [facility];  Service: Endoscopy;  Laterality: N/A;  8:30  . CYSTOCELE REPAIR  08/02/2012   Procedure: ANTERIOR REPAIR (CYSTOCELE);  Surgeon: Ailene Rud, MD;  Location: [facility];  Service: Urology;  Laterality: N/A;  [employer] Uphold Anterior Pelvic Floor Sacrospinus Repair. Anterior wall of the vagina.  . ESOPHAGOGASTRODUODENOSCOPY N/A 02/10/2014   Procedure: ESOPHAGOGASTRODUODENOSCOPY (EGD);  Surgeon: Danie Binder, MD;  [facility];  Service: Endoscopy;  Laterality: N/A;  . GIVENS CAPSULE STUDY N/A 03/13/2014   Procedure: GIVENS CAPSULE STUDY;  Surgeon: Danie Binder, MD;  [facility];  Service: Endoscopy;  Laterality: N/A;  7:30  . NECK SURGERY  2007  . RIGHT PARTIAL MASTECTOMY  11-11-2004  DR [name] YOUNG   DUCTAL CARCINOMA IN SITU RIGHT BREAST  . TOTAL ABDOMINAL HYSTERECTOMY W/ BILATERAL SALPINGOOPHORECTOMY  1990  . TRANSTHORACIC ECHOCARDIOGRAM  03-30-2008  DR MARGARET SIMPSON   LV SIZE AND  FUNCTION NORMAL/ MODERATE AORTIC ARCH DILATATION/ MILD MR    Current Medications: Prior to Admission medications   Medication Sig Start Date End Date Taking? Authorizing Provider  acetaminophen (TYLENOL) 650 MG CR tablet Take 650 mg by mouth every 8 (eight) hours as needed for pain.    [provider]  Ascorbic Acid (VITAMIN C) 1000 MG tablet Take 1,000 mg by mouth daily.    [provider]  aspirin EC 325 MG tablet Take 1 tablet (325 mg total) by mouth  daily. 02/21/13   [name], MD  betamethasone dipropionate (DIPROLENE) 0.05 % cream Apply sparingly two times daily for 10 days, then as needed, to affected areas( hairline, neck) 04/11/19   [name], MD  calcium-vitamin D (OSCAL 500/200 D-3) 500-200 MG-UNIT per tablet Take 1 tablet by mouth daily with breakfast.     [provider]  carvedilol (COREG) 3.125 MG tablet TAKE 1 TABLET(3.125 MG) BY MOUTH TWICE DAILY WITH A MEAL 08/29/19   Croitoru, Mihai, MD  Cetirizine HCl 10 MG TBDP Take 1 tablet by mouth daily. 06/16/19   Perlie Mayo, NP  cholecalciferol (VITAMIN D) 1000 UNITS tablet Take 1,000 Units by mouth daily.    [provider]  Ferrous Sulfate (SLOW FE) 142 (45 Fe) MG TBCR Take 142 mg by mouth daily. 11/10/19   Perlie Mayo, NP  gabapentin (NEURONTIN) 100 MG capsule TAKE TWO CAPSULES BY MOUTH ONCE DAILY AT BEDTIME 04/11/19   [name], MD  hydrochlorothiazide (HYDRODIURIL) 25 MG tablet TAKE 1 TABLET BY MOUTH ONCE DAILY IN THE MORNING 09/05/19   [name], MD  ibuprofen (ADVIL) 800 MG tablet Take 1 tablet (800 mg total) by mouth every 8 (eight) hours as needed. 07/14/19   [name], MD  losartan (COZAAR) 50 MG tablet Take 1/2 (one-half) tablet by mouth once daily 10/25/19   Cardama, Grayce Sessions, MD  lovastatin (MEVACOR) 40 MG tablet Take 1 tablet by mouth once daily with breakfast 11/10/19   [name], MD  montelukast (SINGULAIR) 10 MG tablet TAKE 1 TABLET BY MOUTH AT BEDTIME 11/21/19   [name], MD  Multiple Vitamin (MULTIVITAMIN) tablet Take 1 tablet by mouth daily.    [provider]  pantoprazole (PROTONIX) 40 MG tablet Take 1 tablet (40 mg total) by mouth daily. 07/14/19   [name], MD  potassium chloride (KLOR-CON) 10 MEQ tablet Take 1 tablet (10 mEq total) by mouth daily. 10/24/19   [name], MD  solifenacin (VESICARE) 10 MG tablet Take 10 mg by mouth daily.    [provider]  amLODipine (NORVASC) 10 MG tablet Take 1 tablet (10 mg total) by mouth daily. 07/14/11 11/11/11  [name], MD    Allergies:   Codeine and Quinolones   Social History   Socioeconomic History  . Marital status: Divorced    Spouse name: Not on file  . Number of children: 4  . Years of education: 12+  . Highest education level: 12th grade  Occupational History  . Not on file  Tobacco Use  . Smoking status: Never Smoker  . Smokeless tobacco: Never Used  Substance and Sexual Activity  . Alcohol use: No  . Drug use: No  . Sexual activity: Not Currently  Other Topics Concern  . Not on file  Social History Narrative   RETIRED FROM [employer]. 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   Social Determinants of Radio broadcast assistant  Strain:   . Difficulty of Paying Living Expenses:   Food Insecurity:   . Worried About Charity fundraiser in the Last Year:   . Arboriculturist in the Last Year:   Transportation Needs:   . Film/video editor (Medical):   Marland Kitchen Lack of Transportation (Non-Medical):   Physical Activity:   . Days of Exercise per Week:   . Minutes of Exercise per Session:   Stress:   . Feeling of Stress :   Social Connections:   . Frequency of Communication with Friends and Family:   . Frequency of Social Gatherings with Friends and Family:   . Attends Religious Services:   . Active Member of Clubs or Organizations:   . Attends Archivist Meetings:   Marland Kitchen Marital Status:      Family History:  The patient's family history includes Breast cancer in her sister; Heart attack (age of onset: 65) in her mother; Hypertension in her brother and mother; Kidney failure in her mother; Lung cancer (age of onset: 26) in her father; Prostate cancer in her father; Throat cancer in her father.   ROS:   Please see the history of present illness.    ROS All other systems reviewed and are negative.   PHYSICAL EXAM:   VS:  BP 130/80   Pulse (!) 54   Ht 5'  1.5" (1.562 m)   Wt 192 lb (87.1 kg)   SpO2 98%   BMI 35.69 kg/m    GEN: Well nourished, well developed, in no acute distress  HEENT: normal  Neck: no JVD, carotid bruits, or masses Cardiac: RRR; positive murmurs, rubs, or gallops,no edema  Respiratory:  clear to auscultation bilaterally, normal work of breathing GI: soft, nontender, nondistended, + BS MS: no deformity or atrophy  Skin: warm and dry, no rash Neuro:  Alert and Oriented x 3, Strength and sensation are intact Psych: euthymic mood, full affect  Wt Readings from Last 3 Encounters:  11/23/19 192 lb (87.1 kg)  11/10/19 191 lb (86.6 kg)  10/27/19 183 lb (83 kg)      Studies/Labs Reviewed:   EKG:  EKG is ordered today.  The ekg ordered today demonstrates sinus bradycardia at rate of 54 bpm, no acute finding  Recent Labs: 10/18/2019: ALT 12; BUN 17; Creat 1.05; Hemoglobin 10.7; Platelets 297; Potassium 3.2; Sodium 141; TSH 3.36   Lipid Panel    Component Value Date/Time   CHOL 190 10/18/2019 0907   CHOL 186 10/23/2018 0857   TRIG 59 10/18/2019 0907   HDL 80 10/18/2019 0907   HDL 86 10/23/2018 0857   CHOLHDL 2.4 10/18/2019 0907   VLDL 7 12/06/2016 0801   LDLCALC 96 10/18/2019 0907    Additional studies/ records that were reviewed today include:   Echocardiogram: 08/26/2018 Study Conclusions   - Left ventricle: Turbulent flow through LVOT No obstruction. The  cavity size was normal. Wall thickness was increased in a pattern  of mild LVH. Systolic function was vigorous. The estimated  ejection fraction was in the range of 65% to 70%.  - Aortic valve: Valve area (VTI): 2.02 cm^2. Valve area (Vmax):  2.08 cm^2. Valve area (Vmean): 2.17 cm^2.   ASSESSMENT & PLAN:   1.  Ascending aortic aneurysm -Stable.  Last CT of chest shows 4.2 centimeters.  Followed by Dr. Alcario Drought  2.  LVOT obstruction -Intermittent for years.  Asymptomatic.  3.  Hypertension -Stable and well-controlled on current  medication  4.  Hyperlipidemia - 10/18/2019: Cholesterol 190; HDL 80; LDL Cholesterol (Calc) 96; Triglycerides 59  -Continue lovastatin  5.  Surgical clearance Limited activity due to knee pain.  However, able to get at least 4 METS of activity per [facility] activity status index without any issue. Given past medical history and time since last visit, based on ACC/AHA guidelines, Jiana W Ohle would be at acceptable risk for the planned procedure without further cardiovascular testing. Patient takes ASA 325mg  for hx of stroke>> recommended clearance from PCP or neurology if required.    I will route this recommendation to the requesting party via Epic fax function and remove from pre-op pool.  Please call with questions.  [place], [place] 11/23/2019, 1:08 PM      Medication Adjustments/Labs and Tests Ordered: Current medicines are reviewed at length with the patient today.  Concerns regarding medicines are outlined above.  Medication changes, Labs and Tests ordered today are listed in the Patient Instructions below. Patient Instructions  Medication Instructions:  Your physician recommends that you continue on your current medications as directed. Please refer to the Current Medication list given to you today.  *If you need a refill on your cardiac medications before your next appointment, please call your pharmacy*   Lab Work: None  If you have labs (blood work) drawn today and your tests are completely normal, you will receive your results only by: Marland Kitchen MyChart Message (if you have MyChart) OR . A paper copy in the mail If you have any lab test that is abnormal or we need to change your treatment, we will call you to review the results.   Testing/Procedures: None   Follow-Up: As scheduled with Dr [name] on 01/31/2020 @ 3:40       Signed, [name], PA  11/23/2019 11:49 AM    [address], [place], [place]   [address] Phone: [phone]; Fax: [phone]

## 2019-11-23 ENCOUNTER — Encounter: Payer: Self-pay | Admitting: Physician Assistant

## 2019-11-23 ENCOUNTER — Ambulatory Visit: Payer: PPO | Admitting: Physician Assistant

## 2019-11-23 ENCOUNTER — Other Ambulatory Visit: Payer: Self-pay

## 2019-11-23 VITALS — BP 130/80 | HR 54 | Ht 61.5 in | Wt 192.0 lb

## 2019-11-23 DIAGNOSIS — E7800 Pure hypercholesterolemia, unspecified: Secondary | ICD-10-CM | POA: Diagnosis not present

## 2019-11-23 DIAGNOSIS — I712 Thoracic aortic aneurysm, without rupture: Secondary | ICD-10-CM | POA: Diagnosis not present

## 2019-11-23 DIAGNOSIS — Z01810 Encounter for preprocedural cardiovascular examination: Secondary | ICD-10-CM

## 2019-11-23 DIAGNOSIS — N1831 Chronic kidney disease, stage 3a: Secondary | ICD-10-CM

## 2019-11-23 DIAGNOSIS — Q248 Other specified congenital malformations of heart: Secondary | ICD-10-CM

## 2019-11-23 DIAGNOSIS — I10 Essential (primary) hypertension: Secondary | ICD-10-CM | POA: Diagnosis not present

## 2019-11-23 DIAGNOSIS — I7121 Aneurysm of the ascending aorta, without rupture: Secondary | ICD-10-CM

## 2019-11-23 NOTE — Patient Instructions (Signed)
Medication Instructions:  Your physician recommends that you continue on your current medications as directed. Please refer to the Current Medication list given to you today.  *If you need a refill on your cardiac medications before your next appointment, please call your pharmacy*   Lab Work: None  If you have labs (blood work) drawn today and your tests are completely normal, you will receive your results only by: Marland Kitchen MyChart Message (if you have MyChart) OR . A paper copy in the mail If you have any lab test that is abnormal or we need to change your treatment, we will call you to review the results.   Testing/Procedures: None   Follow-Up: As scheduled with Dr [name] on 01/31/2020 @ 3:40

## 2019-11-23 NOTE — Progress Notes (Signed)
At her age, I would do an echo only as needed for symptoms or if her medical regimen needs to be changed (if we stopped beta-blockers).

## 2019-12-06 ENCOUNTER — Other Ambulatory Visit: Payer: Self-pay | Admitting: Family Medicine

## 2019-12-07 ENCOUNTER — Telehealth: Payer: Self-pay | Admitting: Radiology

## 2019-12-07 NOTE — Telephone Encounter (Signed)
Called home number. Spoke with patients sister. Patient was not at home, she will call back tomorrow morning if she can move up appointment scheduled with Jeneen Rinks on 4/15 at 3:15pm - james had earlier open spots and wanted to offer patient earlier appt.

## 2019-12-08 ENCOUNTER — Other Ambulatory Visit: Payer: Self-pay

## 2019-12-08 ENCOUNTER — Encounter: Payer: Self-pay | Admitting: Surgery

## 2019-12-08 ENCOUNTER — Ambulatory Visit (INDEPENDENT_AMBULATORY_CARE_PROVIDER_SITE_OTHER): Payer: PPO | Admitting: Surgery

## 2019-12-08 ENCOUNTER — Ambulatory Visit: Payer: PPO | Admitting: Surgery

## 2019-12-08 VITALS — BP 123/82 | HR 79 | Ht 61.5 in | Wt 188.0 lb

## 2019-12-08 DIAGNOSIS — M1711 Unilateral primary osteoarthritis, right knee: Secondary | ICD-10-CM

## 2019-12-08 NOTE — Telephone Encounter (Signed)
noted 

## 2019-12-08 NOTE — Progress Notes (Signed)
74 year old black female history of end-stage DJD right knee and pain comes in for preop evaluation.  States that knee symptoms unchanged from previous visit and she is wanting to proceed with total knee replacement scheduled.  We have received preop cardiac clearance.  Today history and physical performed.  Review of systems negative.  Surgical procedure discussed along with potential rehab/recovery time.  All questions answered.

## 2019-12-14 ENCOUNTER — Telehealth: Payer: Self-pay

## 2019-12-14 ENCOUNTER — Encounter (HOSPITAL_COMMUNITY): Payer: Self-pay

## 2019-12-14 ENCOUNTER — Ambulatory Visit (HOSPITAL_COMMUNITY)
Admission: RE | Admit: 2019-12-14 | Discharge: 2019-12-14 | Disposition: A | Discharge status: Home / Self Care | Payer: PPO | Source: Ambulatory Visit | Attending: Surgery | Admitting: Surgery

## 2019-12-14 ENCOUNTER — Other Ambulatory Visit: Payer: Self-pay

## 2019-12-14 ENCOUNTER — Encounter (HOSPITAL_COMMUNITY)
Admission: RE | Admit: 2019-12-14 | Discharge: 2019-12-14 | Disposition: A | Discharge status: Home / Self Care | Payer: PPO | Source: Ambulatory Visit | Attending: Orthopaedic Surgery | Admitting: Orthopaedic Surgery

## 2019-12-14 DIAGNOSIS — Z01818 Encounter for other preprocedural examination: Secondary | ICD-10-CM

## 2019-12-14 HISTORY — DX: Malignant (primary) neoplasm, unspecified: C80.1

## 2019-12-14 LAB — SURGICAL PCR SCREEN
MRSA, PCR: POSITIVE — AB
Staphylococcus aureus: POSITIVE — AB

## 2019-12-14 LAB — COMPREHENSIVE METABOLIC PANEL
ALT: 14 U/L (ref 0–44)
AST: 22 U/L (ref 15–41)
Albumin: 3.6 g/dL (ref 3.5–5.0)
Alkaline Phosphatase: 70 U/L (ref 38–126)
Anion gap: 11 (ref 5–15)
BUN: 16 mg/dL (ref 8–23)
CO2: 27 mmol/L (ref 22–32)
Calcium: 9.3 mg/dL (ref 8.9–10.3)
Chloride: 104 mmol/L (ref 98–111)
Creatinine, Ser: 1 mg/dL (ref 0.44–1.00)
GFR calc Af Amer: >60 mL/min (ref >60)
GFR calc non Af Amer: 56 mL/min — ABNORMAL LOW (ref >60)
Glucose, Bld: 101 mg/dL — ABNORMAL HIGH (ref 70–99)
Potassium: 3.3 mmol/L — ABNORMAL LOW (ref 3.5–5.1)
Sodium: 142 mmol/L (ref 135–145)
Total Bilirubin: 0.5 mg/dL (ref 0.3–1.2)
Total Protein: 6.9 g/dL (ref 6.5–8.1)

## 2019-12-14 LAB — CBC
HCT: 35.8 % — ABNORMAL LOW (ref 36.0–46.0)
Hemoglobin: 11.1 g/dL — ABNORMAL LOW (ref 12.0–15.0)
MCH: 28.3 pg (ref 26.0–34.0)
MCHC: 31 g/dL (ref 30.0–36.0)
MCV: 91.3 fL (ref 80.0–100.0)
Platelets: 315 10*3/uL (ref 150–400)
RBC: 3.92 MIL/uL (ref 3.87–5.11)
RDW: 14.8 % (ref 11.5–15.5)
WBC: 4.8 10*3/uL (ref 4.0–10.5)
nRBC: 0 % (ref 0.0–0.2)

## 2019-12-14 LAB — URINALYSIS, ROUTINE W REFLEX MICROSCOPIC
Bilirubin Urine: NEGATIVE
Glucose, UA: NEGATIVE mg/dL
Hgb urine dipstick: NEGATIVE
Ketones, ur: NEGATIVE mg/dL
Nitrite: NEGATIVE
Protein, ur: NEGATIVE mg/dL
Specific Gravity, Urine: 1.008 (ref 1.005–1.030)
pH: 6 (ref 5.0–8.0)

## 2019-12-14 NOTE — Progress Notes (Signed)
[place], [place] [address] Phone: [phone] Fax: [phone]  [place] [address], [place] - [place] [id number] [place] [address] [place] [address] Phone: [phone] Fax: [phone]      Your procedure is scheduled on Wednesday April 28th.  Report to [facility] Main Entrance "A" at 10:30 A.M., and check in at the Admitting office.  Call this number if you have problems the morning of surgery:  [phone]  Call [phone] if you have any questions prior to your surgery date Monday-Friday 8am-4pm    Remember:  Do not eat after midnight the night before your surgery  You may drink clear liquids until 9:30 the morning of your surgery.   Clear liquids allowed are: Water, Non-Citrus Juices (without pulp), Carbonated Beverages, Clear Tea, Black Coffee Only, and Gatorade.  Patient Instructions  . The night before surgery:  o No food after midnight. ONLY clear liquids after midnight   . The day of surgery (if you do NOT have diabetes):  o Drink ONE (1) Pre-Surgery Clear Ensure as directed.   o This drink was given to you during your hospital  pre-op appointment visit. o The pre-op nurse will instruct you on the time to drink the  Pre-Surgery Ensure depending on your surgery time- finish by 9:30 am the morning of your surgery (Wednesday 12/21/19). o Finish the drink at the designated time by the pre-op nurse.  o Nothing else to drink after completing the  Pre-Surgery Clear Ensure.          If you have questions, please contact your surgeon's office.     Take these medicines the morning of surgery with A SIP OF WATER  carvedilol (Coreg) solifenacin (Vesicare) Acetaminophen (Tylenol)- if needed Artificial tears- if needed Cetirizine-if needed Olopatadine (Patanol) eye drops- if needed   As of today, STOP taking any Aspirin (unless otherwise instructed by your surgeon) and Aspirin containing products,  Aleve, Naproxen, Ibuprofen, Motrin, Advil, Goody's, BC's, all herbal medications, fish oil, and all vitamins.                      Do not wear jewelry, make up, or nail polish            Do not wear lotions, powders, perfumes/colognes, or deodorant.            Do not shave 48 hours prior to surgery.  Men may shave face and neck.            Do not bring valuables to the hospital.            [facility] is not responsible for any belongings or valuables.  Do NOT Smoke (Tobacco/Vapping) or drink Alcohol 24 hours prior to your procedure If you use a CPAP at night, you may bring all equipment for your overnight stay.   Contacts, glasses, dentures or bridgework may not be worn into surgery.      For patients admitted to the hospital, discharge time will be determined by your treatment team.   Patients discharged the day of surgery will not be allowed to drive home, and someone needs to stay with them for 24 hours.    Special instructions:   [facility]- Preparing For Surgery  Before surgery, you can play an important role. Because skin is not sterile, your skin needs to be as free of germs as possible. You can reduce the number of germs  on your skin by washing with CHG (chlorahexidine gluconate) Soap before surgery.  CHG is an antiseptic cleaner which kills germs and bonds with the skin to continue killing germs even after washing.    Oral Hygiene is also important to reduce your risk of infection.  Remember - BRUSH YOUR TEETH THE MORNING OF SURGERY WITH YOUR REGULAR TOOTHPASTE  Please do not use if you have an allergy to CHG or antibacterial soaps. If your skin becomes reddened/irritated stop using the CHG.  Do not shave (including legs and underarms) for at least 48 hours prior to first CHG shower. It is OK to shave your face.  Please follow these instructions carefully.   1. Shower the NIGHT BEFORE SURGERY and the MORNING OF SURGERY with CHG Soap.   2. If you chose to wash your hair,  wash your hair first as usual with your normal shampoo.  3. After you shampoo, rinse your hair and body thoroughly to remove the shampoo.  4. Use CHG as you would any other liquid soap. You can apply CHG directly to the skin and wash gently with a scrungie or a clean washcloth.   5. Apply the CHG Soap to your body ONLY FROM THE NECK DOWN.  Do not use on open wounds or open sores. Avoid contact with your eyes, ears, mouth and genitals (private parts). Wash Face and genitals (private parts)  with your normal soap.   6. Wash thoroughly, paying special attention to the area where your surgery will be performed.  7. Thoroughly rinse your body with warm water from the neck down.  8. DO NOT shower/wash with your normal soap after using and rinsing off the CHG Soap.  9. Pat yourself dry with a CLEAN TOWEL.  10. Wear CLEAN PAJAMAS to bed the night before surgery, wear comfortable clothes the morning of surgery  11. Place CLEAN SHEETS on your bed the night of your first shower and DO NOT SLEEP WITH PETS.   Day of Surgery:   Do not apply any deodorants/lotions.  Please wear clean clothes to the hospital/surgery center.   Remember to brush your teeth WITH YOUR REGULAR TOOTHPASTE.   Please read over the following fact sheets that you were given.

## 2019-12-14 NOTE — Progress Notes (Signed)
Consent for surgery states LEFT knee, per patient and notes surgery is to be on the Right. Contacted Dr. [name] office, left message with above information. Waiting on return call.

## 2019-12-14 NOTE — Progress Notes (Signed)
PCR + for MRSA and MSSA. Left message with Dr. [name] office. Rx for Mupirocin called in to [facility]. Pt aware.

## 2019-12-14 NOTE — Telephone Encounter (Signed)
Meredith with Pre Admission testing called wanting to talk to you about this pt.   Cb# [phone]

## 2019-12-14 NOTE — Progress Notes (Addendum)
PCP - Dr. Tula Nakayama Cardiologist - Dr. [name]   Chest x-ray - 12/14/19 EKG - 11/23/19 Stress Test -  ECHO - 08/26/18 Cardiac Cath - 02/01/09  Sleep Study - denies  Aspirin Instructions: none given, contacted cardiology for instructions- informed to contact neurology for instructions. Contacted Dr. Freddie Apley office in [place] (per patient this is who followed her for CVA). Message left, waiting on return call.  ERAS Protcol -clear liquids PRE-SURGERY Ensure or G2- Ensure  COVID TEST- 12/16/19@ [facility]- instructed to quarantine following covid test until DOS   Anesthesia review: cardiac history  Patient denies shortness of breath, fever, cough and chest pain at PAT appointment   All instructions explained to the patient, with a verbal understanding of the material. Patient agrees to go over the instructions while at home for a better understanding. Patient also instructed to self quarantine after being tested for COVID-19. The opportunity to ask questions was provided.

## 2019-12-15 ENCOUNTER — Encounter (HOSPITAL_COMMUNITY): Payer: Self-pay

## 2019-12-15 ENCOUNTER — Telehealth: Payer: Self-pay | Admitting: Radiology

## 2019-12-15 NOTE — Progress Notes (Signed)
Anesthesia Chart Review:  Case: [id number] Date/Time: 12/21/19 1215   Procedure: RIGHT  TOTAL KNEE ARTHROPLASTY (Right Knee)   Anesthesia type: Spinal   Pre-op diagnosis: right knee osteoarthritis   Location: [facility] OR ROOM 03 / [place] [facility]   Surgeons: [name], MD      DISCUSSION: Patient is a 74 year old female scheduled for the above procedure.  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 BMI is consistent with obesity.  - Preoperative cardiology evaluation on 11/23/19 by [name], [place]. He wrote, "Surgical clearance Limited activity due to knee pain.  However, able to get at least 4 METS of activity per [facility] activity status index without any issue. Given past medical history and time since last visit, based on ACC/AHA guidelines, Desiree [name] would be at acceptable risk for the planned procedure without further cardiovascular testing." If ASA needed to be held for surgery then he recommended clearance to hold ASA by either PCP or neurologist.  In regards to her LVOT, Dr. [name] further explains in his 12/23/18 note that echo was ordered to evaluate for murmur and did not show "significant aortic stenosis or any subvalvular obstruction, but does have a rather hyperdynamic left ventricle and some turbulence in the LVOT without true obstruction.  The left atrium was not dilated and although there was no comment in the report, when adjusted for age she has normal diastolic function parameters as well. LVOT obstruction has been "described intermittently on echoes over the years but is never been associated with anything more than a mild increase in outflow tract  gradients."  - Seen for preoperative medical evaluation by [name], NP on 11/10/19. Clearance note scanned under Media tab. (Message left for [name] at Dr. [name]' office regarding getting clarification on perioperative ASA instructions and communicating with patient.)   Presurgical COVID-19 test is scheduled for 12/16/2019.  Anesthesia team to evaluate on the day of surgery.   VS: BP 123/62   Pulse (!) 59   Temp 37 C (Oral)   Resp 18   Ht 5' 1.5" (1.562 m)   Wt 87.8 kg   SpO2 99%   BMI 35.97 kg/m     PROVIDERS: [name], MD is PCP  [name], MD is cardiologist [name], MD his CT surgeon. Last evaluation 08/11/19. Stable TAA. One year follow-up recommended. [name], MD is nephrologist [name], MD is neurologist, although patient has not seen recently (old CVA noted on MRI from 2011).     LABS: Labs reviewed: Acceptable for surgery. UA showed moderate leukocytes, negative nitrites (routed to Dr. [name]). She was treated for E. Coli UTI with Macrobid x 5 days starting 11/15/19. No recent A1c. Was normal at 5.6% on 05/27/14. Glucose was PAT labs was 101.  (all labs ordered are listed, but only abnormal results are displayed)  Labs Reviewed  SURGICAL PCR SCREEN - Abnormal; Notable for the following components:      Result Value   MRSA, PCR POSITIVE (*)    Staphylococcus aureus POSITIVE (*)    All other components within normal limits  CBC - Abnormal; Notable for the following components:   Hemoglobin 11.1 (*)    HCT 35.8 (*)    All other components within  normal limits  COMPREHENSIVE METABOLIC PANEL - Abnormal; Notable for the following components:   Potassium 3.3 (*)    Glucose, Bld 101 (*)    GFR calc non Af Amer 56 (*)    All other components within normal limits  URINALYSIS, ROUTINE W REFLEX MICROSCOPIC - Abnormal; Notable for the following components:   Color, Urine STRAW (*)    Leukocytes,Ua MODERATE (*)    Bacteria, UA  FEW (*)    All other components within normal limits     IMAGES: CXR 12/14/19: FINDINGS: - Cardiac silhouette is normal in size and configuration. No mediastinal or hilar masses. No evidence of adenopathy. - Clear lungs.  No pleural effusion or pneumothorax. - Eventrated anterior right hemidiaphragm, stable. - Skeletal structures are intact. IMPRESSION: No active cardiopulmonary disease.  CTA chest 08/11/19: IMPRESSION: 1. Stable dilatation of the ascending thoracic aorta measuring approximately 4.2 cm in greatest caliber. Recommend annual imaging followup by CTA or MRA. This recommendation follows 2010 ACCF/AHA/AATS/ACR/ASA/SCA/SCAI/SIR/STS/SVM Guidelines for the Diagnosis and Management of Patients with Thoracic Aortic Disease. Circulation. 2010; [id number]. Aortic aneurysm NOS (ICD10-I71.9) 2. Stable subpleural atelectasis on the right. 3. Stable small areas of air trapping. Aortic Atherosclerosis (ICD10-I70.0).   EKG: 11/23/19: SB at 54 bpm    CV: Echo 08/26/18: Study Conclusions  - Left ventricle: Turbulent flow through LVOT No obstruction. The  cavity size was normal. Wall thickness was increased in a pattern  of mild LVH. Systolic function was vigorous. The estimated  ejection fraction was in the range of 65% to 70%.  - Aortic valve: Valve area (VTI): 2.02 cm^2. Valve area (Vmax):  2.08 cm^2. Valve area (Vmean): 2.17 cm^2.  - On 11/23/19, Dr. [name] wrote, "At her age, I would do an echo only as needed for symptoms or if her medical regimen needs to be changed (if we stopped beta-blockers)."  Cardiac cath 02/01/09:  SUMMARY:  1. Normal coronary arteries without any evidence of angiographic      significant disease.  2. Normal left ventricular size and function.  3. No significant aortic valve gradient.  No significant mitral      regurgitation.  4. Aortic root size at the upper limit of normal.  Carotid US 01/17/09: IMPRESSION: Mild thickening  bilaterally.  No significant plaque formation or stenosis.   Past Medical History:  Diagnosis Date  . Allergic rhinitis   . Arthritis HIPS/ WRISTS  . Cancer ([place])   . DDD (degenerative disc disease), cervical   . GERD (gastroesophageal reflux disease) OCCASIONAL  . Heart murmur    turbulent flow through LVOT, no obstruction 08/26/18 echo  . History of Bell's palsy 2011  RIGHT SIDE -- RESOLVED  . 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 History of CVA (cerebrovascular accident) PER SCAN IN 2011  . Hyperlipidemia   . Hypertension   . Impaired fasting glucose PER PCP  DR SIMPSON   WATCH DIET  . Mixed urge and stress incontinence   . Nocturia   . Sinus drainage   . Thoracic ascending aortic aneurysm ([place]) LAST CHEST CT 09-02-2010--  FOLLOWED BY  CARDIOLOGIST--  DR NO:[id number]  . Vaginal wall prolapse     Past Surgical History:  Procedure Laterality Date  . ABDOMINAL HYSTERECTOMY    . BILATERAL CARPAL TUNNEL RELEASE  1980'S  . CARDIAC CATHETERIZATION  02-01-2009  DR [facility]   NORMAL CORONARY ARTERIES/ NORMAL LV SIZE AND FUNCTION/ AORTIC  ROOT SIZE AT THE UPPER LIMIT OF NORMAL   . CARPAL TUNNEL RELEASE    . CERVICAL DISC SURGERY  12-12-2005  DR Vertell Limber   LEFT  C6 - C7 HERINATED / DDD/ SPONDYLOSIS  . COLONOSCOPY N/A 02/10/2014   Procedure: COLONOSCOPY;  Surgeon: Danie Binder, MD;  [facility];  Service: Endoscopy;  Laterality: N/A;  8:30  . CYSTOCELE REPAIR  08/02/2012   Procedure: ANTERIOR REPAIR (CYSTOCELE);  Surgeon: Ailene Rud, MD;  [facility];  Service: Urology;  Laterality: N/A;  [employer] Uphold Anterior Pelvic Floor Sacrospinus Repair. Anterior wall of the vagina.  . ESOPHAGOGASTRODUODENOSCOPY N/A 02/10/2014   Procedure: ESOPHAGOGASTRODUODENOSCOPY (EGD);  Surgeon: Danie Binder, MD;  [facility];  Service: Endoscopy;  Laterality: N/A;  . GIVENS  CAPSULE STUDY N/A 03/13/2014   Procedure: GIVENS CAPSULE STUDY;  Surgeon: Danie Binder, MD;  Location: [facility];  Service: Endoscopy;  Laterality: N/A;  7:30  . NECK SURGERY  2007  . RIGHT PARTIAL MASTECTOMY  11-11-2004  DR [name] YOUNG   DUCTAL CARCINOMA IN SITU RIGHT BREAST  . TOTAL ABDOMINAL HYSTERECTOMY W/ BILATERAL SALPINGOOPHORECTOMY  1990  . TRANSTHORACIC ECHOCARDIOGRAM  03-30-2008  DR MARGARET SIMPSON   LV SIZE AND FUNCTION NORMAL/ MODERATE AORTIC ARCH DILATATION/ MILD MR    MEDICATIONS: . acetaminophen (TYLENOL) 650 MG CR tablet  . Artificial Tear Solution (SOOTHE XP OP)  . Ascorbic Acid (VITAMIN C) 1000 MG tablet  . aspirin EC 325 MG tablet  . betamethasone dipropionate (DIPROLENE) 0.05 % cream  . calcium-vitamin D (OSCAL 500/200 D-3) 500-200 MG-UNIT per tablet  . carvedilol (COREG) 3.125 MG tablet  . Cetirizine HCl 10 MG TBDP  . Cholecalciferol (VITAMIN D) 50 MCG (2000 UT) tablet  . docusate sodium (COLACE) 100 MG capsule  . Ferrous Sulfate (SLOW FE) 142 (45 Fe) MG TBCR  . gabapentin (NEURONTIN) 100 MG capsule  . guaifenesin (HUMIBID E) 400 MG TABS tablet  . hydrochlorothiazide (HYDRODIURIL) 25 MG tablet  . ibuprofen (ADVIL) 800 MG tablet  . losartan (COZAAR) 50 MG tablet  . lovastatin (MEVACOR) 40 MG tablet  . montelukast (SINGULAIR) 10 MG tablet  . Multiple Vitamin (MULTIVITAMIN) tablet  . olopatadine (PATANOL) 0.1 % ophthalmic solution  . potassium chloride (KLOR-CON) 10 MEQ tablet  . solifenacin (VESICARE) 10 MG tablet  . zinc gluconate 50 MG tablet   No current facility-administered medications for this encounter.    Myra Gianotti, PA-C Surgical Short Stay/Anesthesiology [facility] Phone [phone] [facility] Phone [phone] 12/15/2019 4:38 PM

## 2019-12-15 NOTE — Telephone Encounter (Signed)
Thanks, Jeneen Rinks already ordered [facility]. All set now for surgery

## 2019-12-15 NOTE — Telephone Encounter (Signed)
noted 

## 2019-12-15 NOTE — Anesthesia Preprocedure Evaluation (Addendum)
Anesthesia Evaluation  Patient identified by MRN, date of birth, ID band Patient awake    Reviewed: Allergy & Precautions, NPO status , Patient's Chart, lab work & pertinent test results  History of Anesthesia Complications Negative for: history of anesthetic complications  Airway Mallampati: III  TM Distance: >3 FB Neck ROM: Full    Dental  (+) Dental Advisory Given   Pulmonary neg shortness of breath, neg sleep apnea, neg COPD, neg recent URI,    breath sounds clear to auscultation       Cardiovascular hypertension, Pt. on medications and Pt. on home beta blockers (-) angina(-) Past MI  Rhythm:Regular  Left ventricle: Turbulent flow through LVOT No obstruction. The  cavity size was normal. Wall thickness was increased in a pattern  of mild LVH. Systolic function was vigorous. The estimated  ejection fraction was in the range of 65% to 70%.  - Aortic valve: Valve area (VTI): 2.02 cm^2. Valve area (Vmax):  2.08 cm^2. Valve area (Vmean): 2.17 cm^2.    Neuro/Psych negative neurological ROS  negative psych ROS   GI/Hepatic Neg liver ROS, GERD  ,  Endo/Other  Morbid obesity  Renal/GU CRFRenal disease     Musculoskeletal  (+) Arthritis ,   Abdominal   Peds  Hematology  (+) Blood dyscrasia, anemia ,   Anesthesia Other Findings   Reproductive/Obstetrics                            Anesthesia Physical Anesthesia Plan  ASA: III  Anesthesia Plan: MAC, Regional and Spinal   Post-op Pain Management:  Regional for Post-op pain   Induction:   PONV Risk Score and Plan: 2 and Treatment may vary due to age or medical condition and Propofol infusion  Airway Management Planned: Nasal Cannula  Additional Equipment: None  Intra-op Plan:   Post-operative Plan:   Informed Consent: I have reviewed the patients History and Physical, chart, labs and discussed the procedure including the  risks, benefits and alternatives for the proposed anesthesia with the patient or authorized representative who has indicated his/her understanding and acceptance.     Dental advisory given  Plan Discussed with: CRNA and Surgeon  Anesthesia Plan Comments: (PAT note written by Myra Gianotti, PA-C. )       Anesthesia Quick Evaluation

## 2019-12-15 NOTE — Telephone Encounter (Signed)
FYI. Please see messages from [place] in regards to call from  [place]. Let me know if we need anything further.   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 I asked her to send labs and results directly to Dr. [name].      PCR + for MRSA and MSSA. Left message with Dr. [name] office. Rx for Mupirocin called in to [facility]. Pt aware.

## 2019-12-16 ENCOUNTER — Encounter: Payer: Self-pay | Admitting: Family Medicine

## 2019-12-16 ENCOUNTER — Other Ambulatory Visit: Payer: Self-pay

## 2019-12-16 ENCOUNTER — Telehealth: Payer: Self-pay | Admitting: Orthopaedic Surgery

## 2019-12-16 ENCOUNTER — Other Ambulatory Visit (HOSPITAL_COMMUNITY)
Admission: RE | Admit: 2019-12-16 | Discharge: 2019-12-16 | Disposition: A | Discharge status: Home / Self Care | Payer: PPO | Source: Ambulatory Visit | Attending: Orthopaedic Surgery | Admitting: Orthopaedic Surgery

## 2019-12-16 DIAGNOSIS — Z20822 Contact with and (suspected) exposure to covid-19: Secondary | ICD-10-CM | POA: Diagnosis not present

## 2019-12-16 DIAGNOSIS — Z01812 Encounter for preprocedural laboratory examination: Secondary | ICD-10-CM | POA: Diagnosis not present

## 2019-12-16 NOTE — Telephone Encounter (Signed)
FYI

## 2019-12-16 NOTE — Telephone Encounter (Signed)
Pt called wanting to make [place] aware her Dr. Rockey Situ her to stop the Asprin 5 days before surgery and then resume taking them 24-48 hrs after her surgery.   [phone]

## 2019-12-17 LAB — SARS CORONAVIRUS 2 (TAT 6-24 HRS): SARS Coronavirus 2: NEGATIVE

## 2019-12-20 NOTE — H&P (Signed)
TOTAL KNEE ADMISSION H&P  Patient is being admitted for right total knee arthroplasty.  Subjective:  Chief Complaint:right knee pain.  HPI: [name], 74 y.o. female, has a history of pain and functional disability in the right knee due to arthritis and has failed non-surgical conservative treatments for greater than 12 weeks to includeNSAID's and/or analgesics, corticosteriod injections, use of assistive devices and activity modification.  Onset of symptoms was gradual, starting 10 years ago with gradually worsening course since that time.   Patient currently rates pain in the right knee(s) at 10 out of 10 with activity. Patient has night pain, worsening of pain with activity and weight bearing, pain that interferes with activities of daily living, crepitus and joint swelling.  Patient has evidence of subchondral cysts, subchondral sclerosis, periarticular osteophytes and joint space narrowing by imaging studies.  There is no active infection.  Patient Active Problem List   Diagnosis Date Noted  . Pre-operative clearance 11/10/2019  . Acute cystitis with hematuria 11/10/2019  . Constipation 11/10/2019  . Unilateral primary osteoarthritis, right knee 10/30/2019  . Fatigue 10/18/2019  . Left shoulder pain 04/11/2019  . Ganglion cyst of volar aspect of left wrist 10/05/2018  . Fall at home, subsequent encounter 06/07/2018  . Knee pain, right 05/31/2017  . CKD (chronic kidney disease) stage 3, GFR 30-59 ml/min 10/01/2016  . Neck pain on left side 01/01/2016  . Annual physical exam 02/15/2015  . 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 Urinary incontinence 05/22/2014  . Metabolic syndrome X [id number]  . Osteopenia 12/12/2013  . Anemia 11/29/2013  . Obesity (BMI 30-39.9) 10/17/2012  . Thoracic aortic aneurysm ([place]) 10/12/2012  . Allergic rhinitis 03/02/2012  . BLADDER PROLAPSE 09/24/2010  . NEOPLASM, MALIGNANT, BREAST, RIGHT 02/04/2008  . Hyperlipidemia LDL goal  <100 02/04/2008  . Essential hypertension 02/04/2008   Past Medical History:  Diagnosis Date  . Allergic rhinitis   . Arthritis HIPS/ WRISTS  . Cancer ([place])   . DDD (degenerative disc disease), cervical   . GERD (gastroesophageal reflux disease) OCCASIONAL  . Heart murmur    turbulent flow through LVOT, no obstruction 08/26/18 echo  . History of Bell's palsy 2011  RIGHT SIDE -- RESOLVED  . 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 History of CVA (cerebrovascular accident) PER SCAN IN 2011  . Hyperlipidemia   . Hypertension   . Impaired fasting glucose PER PCP  DR SIMPSON   WATCH DIET  . Mixed urge and stress incontinence   . Nocturia   . Sinus drainage   . Thoracic ascending aortic aneurysm ([place]) LAST CHEST CT 09-02-2010--  FOLLOWED BY  CARDIOLOGIST--  DR NO:[id number]  . Vaginal wall prolapse     Past Surgical History:  Procedure Laterality Date  . ABDOMINAL HYSTERECTOMY    . BILATERAL CARPAL TUNNEL RELEASE  1980'S  . CARDIAC CATHETERIZATION  02-01-2009  DR EICHHORN   NORMAL CORONARY ARTERIES/ NORMAL LV SIZE AND FUNCTION/ AORTIC ROOT SIZE AT THE UPPER LIMIT OF NORMAL   . CARPAL TUNNEL RELEASE    . CERVICAL DISC SURGERY  12-12-2005  DR Vertell Limber   LEFT  C6 - C7 HERINATED / DDD/ SPONDYLOSIS  . COLONOSCOPY N/A 02/10/2014   Procedure: COLONOSCOPY;  Surgeon: Danie Binder, MD;  [facility];  Service: Endoscopy;  Laterality: N/A;  8:30  . CYSTOCELE REPAIR  08/02/2012   Procedure: ANTERIOR REPAIR (CYSTOCELE);  Surgeon: Pierre Bali  Joya San, MD;  Location[facility];  Service: Urology;  Laterality: N/A;  [employer] Uphold Anterior Pelvic Floor Sacrospinus Repair. Anterior wall of the vagina.  . ESOPHAGOGASTRODUODENOSCOPY N/A 02/10/2014   Procedure: ESOPHAGOGASTRODUODENOSCOPY (EGD);  Surgeon: Danie Binder, MD;  [facility];  Service: Endoscopy;  Laterality: N/A;  . GIVENS CAPSULE  STUDY N/A 03/13/2014   Procedure: GIVENS CAPSULE STUDY;  Surgeon: Danie Binder, MD;  [facility];  Service: Endoscopy;  Laterality: N/A;  7:30  . NECK SURGERY  2007  . RIGHT PARTIAL MASTECTOMY  11-11-2004  DR [name] YOUNG   DUCTAL CARCINOMA IN SITU RIGHT BREAST  . TOTAL ABDOMINAL HYSTERECTOMY W/ BILATERAL SALPINGOOPHORECTOMY  1990  . TRANSTHORACIC ECHOCARDIOGRAM  03-30-2008  DR MARGARET SIMPSON   LV SIZE AND FUNCTION NORMAL/ MODERATE AORTIC ARCH DILATATION/ MILD MR    No current facility-administered medications for this encounter.   Current Outpatient Medications  Medication Sig Dispense Refill Last Dose  . acetaminophen (TYLENOL) 650 MG CR tablet Take 650-1,300 mg by mouth every 8 (eight) hours as needed for pain.      . Artificial Tear Solution (SOOTHE XP OP) Place 2 drops into both ears daily as needed (Dry eye).     . Ascorbic Acid (VITAMIN C) 1000 MG tablet Take 1,000 mg by mouth daily.     Marland Kitchen aspirin EC 325 MG tablet Take 1 tablet (325 mg total) by mouth daily. 100 tablet 3   . calcium-vitamin D (OSCAL 500/200 D-3) 500-200 MG-UNIT per tablet Take 1 tablet by mouth daily with breakfast.      . carvedilol (COREG) 3.125 MG tablet TAKE 1 TABLET(3.125 MG) BY MOUTH TWICE DAILY WITH A MEAL (Patient taking differently: Take 3.125 mg by mouth 2 (two) times daily with a meal. ) 180 tablet 1   . Cetirizine HCl 10 MG TBDP Take 1 tablet by mouth daily. (Patient taking differently: Take 10 mg by mouth daily. ) 90 tablet 3   . Cholecalciferol (VITAMIN D) 50 MCG (2000 UT) tablet Take 2,000 Units by mouth daily.      Marland Kitchen docusate sodium (COLACE) 100 MG capsule Take 100 mg by mouth daily as needed for mild constipation.     . Ferrous Sulfate (SLOW FE) 142 (45 Fe) MG TBCR Take 142 mg by mouth daily. (Patient taking differently: Take 142 mg by mouth every other day. ) 30 tablet 1   . gabapentin (NEURONTIN) 100 MG capsule TAKE TWO CAPSULES BY MOUTH ONCE DAILY AT BEDTIME (Patient taking differently:  Take 200 mg by mouth 4 (four) times daily. ) 180 capsule 3   . guaifenesin (HUMIBID E) 400 MG TABS tablet Take 400 mg by mouth daily as needed (Mucus build up). Mucus relief     . ibuprofen (ADVIL) 800 MG tablet Take 1 tablet (800 mg total) by mouth every 8 (eight) hours as needed. (Patient taking differently: Take 800 mg by mouth every 8 (eight) hours as needed for headache or moderate pain. ) 30 tablet 0   . losartan (COZAAR) 50 MG tablet Take 1/2 (one-half) tablet by mouth once daily (Patient taking differently: Take 25 mg by mouth daily. ) 45 tablet 0   . lovastatin (MEVACOR) 40 MG tablet Take 1 tablet by mouth once daily with breakfast (Patient taking differently: Take 40 mg by mouth daily. ) 90 tablet 0   . montelukast (SINGULAIR) 10 MG tablet TAKE 1 TABLET BY MOUTH AT BEDTIME (Patient taking differently: Take 10 mg  by mouth at bedtime. ) 90 tablet 0   . Multiple Vitamin (MULTIVITAMIN) tablet Take 1 tablet by mouth daily. One a day woman's     . olopatadine (PATANOL) 0.1 % ophthalmic solution Place 1 drop into both eyes daily.     . potassium chloride (KLOR-CON) 10 MEQ tablet Take 1 tablet (10 mEq total) by mouth daily. 30 tablet 5   . solifenacin (VESICARE) 10 MG tablet Take 10 mg by mouth daily.     Marland Kitchen zinc gluconate 50 MG tablet Take 50 mg by mouth daily.     . betamethasone dipropionate (DIPROLENE) 0.05 % cream Apply sparingly two times daily for 10 days, then as needed, to affected areas( hairline, neck) (Patient not taking: Reported on 12/05/2019) 45 g 1 Not Taking at Unknown time  . hydrochlorothiazide (HYDRODIURIL) 25 MG tablet TAKE 1 TABLET BY MOUTH ONCE DAILY IN THE MORNING 90 tablet 0    Allergies  Allergen Reactions  . Codeine Other (See Comments)    CONFUSION/ DIZZY  . Quinolones Other (See Comments)    unknown    Social History   Tobacco Use  . Smoking status: Never Smoker  . Smokeless tobacco: Never Used  Substance Use Topics  . Alcohol use: No    Family History   Problem Relation Age of Onset  . Kidney failure Mother   . Hypertension Mother   . Heart attack Mother 52  . Throat cancer Father   . Prostate cancer Father   . Lung cancer Father 19  . Breast cancer Sister   . Hypertension Brother        2 stents  . Colon cancer Neg Hx   . Colon polyps Neg Hx      Review of Systems  Constitutional: Positive for activity change.  HENT: Negative.   Respiratory: Negative.   Cardiovascular: Negative.   Gastrointestinal: Negative.   Genitourinary: Negative.   Musculoskeletal: Positive for gait problem and joint swelling.  Psychiatric/Behavioral: Negative.     Objective:  Physical Exam  Constitutional: She is oriented to person, place, and time. No distress.  HENT:  Head: Normocephalic and atraumatic.  Eyes: Pupils are equal, round, and reactive to light. EOM are normal.  Cardiovascular: Normal heart sounds.  Respiratory: Effort normal. No respiratory distress.  GI: She exhibits no distension.  Musculoskeletal:        General: Tenderness present.     Cervical back: Normal range of motion.  Neurological: She is alert and oriented to person, place, and time.  Skin: Skin is warm and dry.  Psychiatric: She has a normal mood and affect.    Vital signs in last 24 hours:    Labs:   Estimated body mass index is 35.97 kg/m as calculated from the following:   Height as of 12/14/19: 5' 1.5" (1.562 m).   Weight as of 12/14/19: 87.8 kg.   Imaging Review Plain radiographs demonstrate moderate degenerative joint disease of the right knee(s). The overall alignment ismild varus. The bone quality appears to be good for age and reported activity level.      Assessment/Plan:  End stage arthritis, right knee   The patient history, physical examination, clinical judgment of the provider and imaging studies are consistent with end stage degenerative joint disease of the right knee(s) and total knee arthroplasty is deemed medically necessary. The  treatment options including medical management, injection therapy arthroscopy and arthroplasty were discussed at length. The risks and benefits of total knee arthroplasty were presented and  reviewed. The risks due to aseptic loosening, infection, stiffness, patella tracking problems, thromboembolic complications and other imponderables were discussed. The patient acknowledged the explanation, agreed to proceed with the plan and consent was signed. Patient is being admitted for inpatient treatment for surgery, pain control, PT, OT, prophylactic antibiotics, VTE prophylaxis, progressive ambulation and ADL's and discharge planning. The patient is planning to be discharged home with home health services    Anticipated LOS equal to or greater than 2 midnights due to - Age 71 and older with one or more of the following:  - Obesity  - Expected need for hospital services (PT, OT, Nursing) required for safe  discharge  - Anticipated need for postoperative skilled nursing care or inpatient rehab  - Active co-morbidities: None OR   - Unanticipated findings during/Post Surgery: None  - Patient is a high risk of re-admission due to: None

## 2019-12-21 ENCOUNTER — Encounter (HOSPITAL_COMMUNITY)
Admission: RE | Disposition: A | Discharge status: Home / Self Care | Payer: Self-pay | Source: Home / Self Care | Attending: Orthopaedic Surgery

## 2019-12-21 ENCOUNTER — Other Ambulatory Visit: Payer: Self-pay

## 2019-12-21 ENCOUNTER — Ambulatory Visit (HOSPITAL_COMMUNITY): Payer: PPO | Admitting: Certified Registered Nurse Anesthetist

## 2019-12-21 ENCOUNTER — Observation Stay (HOSPITAL_COMMUNITY)
Admission: RE | Admit: 2019-12-21 | Discharge: 2019-12-23 | Disposition: A | Discharge status: Home / Self Care | Payer: PPO | Attending: Orthopaedic Surgery | Admitting: Orthopaedic Surgery

## 2019-12-21 ENCOUNTER — Encounter (HOSPITAL_COMMUNITY): Payer: Self-pay | Admitting: Orthopaedic Surgery

## 2019-12-21 ENCOUNTER — Ambulatory Visit (HOSPITAL_COMMUNITY): Payer: PPO | Admitting: Vascular Surgery

## 2019-12-21 DIAGNOSIS — N3946 Mixed incontinence: Secondary | ICD-10-CM | POA: Insufficient documentation

## 2019-12-21 DIAGNOSIS — Z853 Personal history of malignant neoplasm of breast: Secondary | ICD-10-CM | POA: Insufficient documentation

## 2019-12-21 DIAGNOSIS — Z6835 Body mass index (BMI) 35.0-35.9, adult: Secondary | ICD-10-CM | POA: Insufficient documentation

## 2019-12-21 DIAGNOSIS — Z96651 Presence of right artificial knee joint: Secondary | ICD-10-CM

## 2019-12-21 DIAGNOSIS — Z86000 Personal history of in-situ neoplasm of breast: Secondary | ICD-10-CM | POA: Insufficient documentation

## 2019-12-21 DIAGNOSIS — D631 Anemia in chronic kidney disease: Secondary | ICD-10-CM | POA: Diagnosis not present

## 2019-12-21 DIAGNOSIS — M1711 Unilateral primary osteoarthritis, right knee: Secondary | ICD-10-CM | POA: Diagnosis not present

## 2019-12-21 DIAGNOSIS — E785 Hyperlipidemia, unspecified: Secondary | ICD-10-CM | POA: Diagnosis not present

## 2019-12-21 DIAGNOSIS — I129 Hypertensive chronic kidney disease with stage 1 through stage 4 chronic kidney disease, or unspecified chronic kidney disease: Secondary | ICD-10-CM | POA: Diagnosis not present

## 2019-12-21 DIAGNOSIS — I712 Thoracic aortic aneurysm, without rupture: Secondary | ICD-10-CM | POA: Insufficient documentation

## 2019-12-21 DIAGNOSIS — N183 Chronic kidney disease, stage 3 unspecified: Secondary | ICD-10-CM | POA: Insufficient documentation

## 2019-12-21 DIAGNOSIS — E8881 Metabolic syndrome: Secondary | ICD-10-CM | POA: Diagnosis not present

## 2019-12-21 DIAGNOSIS — J309 Allergic rhinitis, unspecified: Secondary | ICD-10-CM | POA: Insufficient documentation

## 2019-12-21 DIAGNOSIS — Z8673 Personal history of transient ischemic attack (TIA), and cerebral infarction without residual deficits: Secondary | ICD-10-CM | POA: Insufficient documentation

## 2019-12-21 DIAGNOSIS — G8918 Other acute postprocedural pain: Secondary | ICD-10-CM | POA: Diagnosis not present

## 2019-12-21 DIAGNOSIS — Z79899 Other long term (current) drug therapy: Secondary | ICD-10-CM | POA: Insufficient documentation

## 2019-12-21 DIAGNOSIS — Z7982 Long term (current) use of aspirin: Secondary | ICD-10-CM | POA: Insufficient documentation

## 2019-12-21 HISTORY — PX: TOTAL KNEE ARTHROPLASTY: SHX125

## 2019-12-21 SURGERY — ARTHROPLASTY, KNEE, TOTAL
Anesthesia: Monitor Anesthesia Care | Site: Knee | Laterality: Right

## 2019-12-21 MED ORDER — DIPHENHYDRAMINE HCL 12.5 MG/5ML PO ELIX: 12.5000 mg | ORAL_SOLUTION | ORAL | Status: DC | PRN

## 2019-12-21 MED ORDER — ASPIRIN EC 325 MG PO TBEC
325.0000 mg | DELAYED_RELEASE_TABLET | Freq: Every day | ORAL | Status: DC
  Administered 2019-12-22 – 2019-12-23 (×2): 325 mg via ORAL
  Filled 2019-12-21 (×3): qty 1

## 2019-12-21 MED ORDER — GUAIFENESIN 200 MG PO TABS
400.0000 mg | ORAL_TABLET | Freq: Every day | ORAL | Status: DC | PRN
  Filled 2019-12-21: qty 2

## 2019-12-21 MED ORDER — 0.9 % SODIUM CHLORIDE (POUR BTL) OPTIME
TOPICAL | Status: DC | PRN
  Administered 2019-12-21: 1000 mL

## 2019-12-21 MED ORDER — VITAMIN D 50 MCG (2000 UT) PO TABS: 2000.0000 [IU] | ORAL_TABLET | Freq: Every day | ORAL | Status: DC

## 2019-12-21 MED ORDER — FENTANYL CITRATE (PF) 100 MCG/2ML IJ SOLN
INTRAMUSCULAR | Status: AC
  Administered 2019-12-21: 13:00:00 50 ug via INTRAVENOUS
  Filled 2019-12-21: qty 2

## 2019-12-21 MED ORDER — ZINC GLUCONATE 50 MG PO TABS: 50.0000 mg | ORAL_TABLET | Freq: Every day | ORAL | Status: DC

## 2019-12-21 MED ORDER — ASPIRIN EC 325 MG PO TBEC: 325.0000 mg | DELAYED_RELEASE_TABLET | Freq: Every day | ORAL | Status: DC

## 2019-12-21 MED ORDER — SODIUM CHLORIDE 0.9 % IV SOLN: INTRAVENOUS | Status: DC

## 2019-12-21 MED ORDER — CHLORHEXIDINE GLUCONATE CLOTH 2 % EX PADS
6.0000 | MEDICATED_PAD | Freq: Every day | CUTANEOUS | Status: DC
  Administered 2019-12-21: 6 via TOPICAL

## 2019-12-21 MED ORDER — BUPIVACAINE HCL (PF) 0.25 % IJ SOLN
INTRAMUSCULAR | Status: DC | PRN
  Administered 2019-12-21: 20 mL

## 2019-12-21 MED ORDER — BUPIVACAINE IN DEXTROSE 0.75-8.25 % IT SOLN
INTRATHECAL | Status: DC | PRN
  Administered 2019-12-21: 1.8 mL via INTRATHECAL

## 2019-12-21 MED ORDER — CELECOXIB 200 MG PO CAPS
200.0000 mg | ORAL_CAPSULE | Freq: Two times a day (BID) | ORAL | Status: DC
  Administered 2019-12-21 – 2019-12-23 (×4): 200 mg via ORAL
  Filled 2019-12-21 (×5): qty 1

## 2019-12-21 MED ORDER — ONDANSETRON HCL 4 MG PO TABS: 4.0000 mg | ORAL_TABLET | Freq: Four times a day (QID) | ORAL | Status: DC | PRN

## 2019-12-21 MED ORDER — BUPIVACAINE HCL (PF) 0.25 % IJ SOLN
INTRAMUSCULAR | Status: AC
  Filled 2019-12-21: qty 30

## 2019-12-21 MED ORDER — PHENYLEPHRINE 40 MCG/ML (10ML) SYRINGE FOR IV PUSH (FOR BLOOD PRESSURE SUPPORT)
PREFILLED_SYRINGE | INTRAVENOUS | Status: DC | PRN
  Administered 2019-12-21: 80 ug via INTRAVENOUS
  Administered 2019-12-21: 40 ug via INTRAVENOUS

## 2019-12-21 MED ORDER — LOSARTAN POTASSIUM 25 MG PO TABS
25.0000 mg | ORAL_TABLET | Freq: Every day | ORAL | Status: DC
  Administered 2019-12-21 – 2019-12-23 (×2): 25 mg via ORAL
  Filled 2019-12-21 (×3): qty 1

## 2019-12-21 MED ORDER — BISACODYL 10 MG RE SUPP: 10.0000 mg | Freq: Every day | RECTAL | Status: DC | PRN

## 2019-12-21 MED ORDER — CALCIUM CARBONATE-VITAMIN D 500-200 MG-UNIT PO TABS: 1.0000 | ORAL_TABLET | Freq: Every day | ORAL | Status: DC

## 2019-12-21 MED ORDER — ROPIVACAINE HCL 7.5 MG/ML IJ SOLN
INTRAMUSCULAR | Status: DC | PRN
Start: 2019-12-21 — End: 2019-12-21
  Administered 2019-12-21: 20 mL via PERINEURAL

## 2019-12-21 MED ORDER — FERROUS SULFATE ER 142 (45 FE) MG PO TBCR: 142.0000 mg | EXTENDED_RELEASE_TABLET | ORAL | Status: DC

## 2019-12-21 MED ORDER — ASCORBIC ACID 500 MG PO TABS: 1000.0000 mg | ORAL_TABLET | Freq: Every day | ORAL | Status: DC

## 2019-12-21 MED ORDER — HYDROMORPHONE HCL 1 MG/ML IJ SOLN
0.5000 mg | INTRAMUSCULAR | Status: DC | PRN
  Administered 2019-12-21: 1 mg via INTRAVENOUS
  Filled 2019-12-21: qty 1

## 2019-12-21 MED ORDER — ONDANSETRON HCL 4 MG/2ML IJ SOLN
INTRAMUSCULAR | Status: AC
  Filled 2019-12-21: qty 2

## 2019-12-21 MED ORDER — OXYCODONE HCL 5 MG PO TABS
10.0000 mg | ORAL_TABLET | ORAL | Status: DC | PRN
  Administered 2019-12-22: 10:00:00 15 mg via ORAL
  Filled 2019-12-21: qty 3

## 2019-12-21 MED ORDER — MIDAZOLAM HCL 2 MG/2ML IJ SOLN: 1.0000 mg | Freq: Once | INTRAMUSCULAR | Status: AC

## 2019-12-21 MED ORDER — METOCLOPRAMIDE HCL 5 MG/ML IJ SOLN: 5.0000 mg | Freq: Three times a day (TID) | INTRAMUSCULAR | Status: DC | PRN

## 2019-12-21 MED ORDER — ONDANSETRON HCL 4 MG/2ML IJ SOLN
INTRAMUSCULAR | Status: DC | PRN
  Administered 2019-12-21: 4 mg via INTRAVENOUS

## 2019-12-21 MED ORDER — ONDANSETRON HCL 4 MG/2ML IJ SOLN
4.0000 mg | Freq: Four times a day (QID) | INTRAMUSCULAR | Status: DC | PRN
  Administered 2019-12-21: 4 mg via INTRAVENOUS
  Filled 2019-12-21: qty 2

## 2019-12-21 MED ORDER — OLOPATADINE HCL 0.1 % OP SOLN
1.0000 [drp] | Freq: Every day | OPHTHALMIC | Status: DC
  Administered 2019-12-22 – 2019-12-23 (×2): 1 [drp] via OPHTHALMIC
  Filled 2019-12-21: qty 5

## 2019-12-21 MED ORDER — PRAVASTATIN SODIUM 10 MG PO TABS
10.0000 mg | ORAL_TABLET | Freq: Every day | ORAL | Status: DC
  Administered 2019-12-21: 10 mg via ORAL
  Filled 2019-12-21 (×2): qty 1

## 2019-12-21 MED ORDER — MENTHOL 3 MG MT LOZG: 1.0000 | LOZENGE | OROMUCOSAL | Status: DC | PRN

## 2019-12-21 MED ORDER — ACETAMINOPHEN 325 MG PO TABS
325.0000 mg | ORAL_TABLET | Freq: Four times a day (QID) | ORAL | Status: DC | PRN
  Administered 2019-12-22 – 2019-12-23 (×2): 650 mg via ORAL
  Filled 2019-12-21 (×2): qty 2

## 2019-12-21 MED ORDER — FLEET ENEMA 7-19 GM/118ML RE ENEM: 1.0000 | ENEMA | Freq: Once | RECTAL | Status: DC | PRN

## 2019-12-21 MED ORDER — BUPIVACAINE LIPOSOME 1.3 % IJ SUSP
INTRAMUSCULAR | Status: DC | PRN
  Administered 2019-12-21: 20 mL

## 2019-12-21 MED ORDER — LACTATED RINGERS IV SOLN: INTRAVENOUS | Status: DC

## 2019-12-21 MED ORDER — DOCUSATE SODIUM 100 MG PO CAPS
100.0000 mg | ORAL_CAPSULE | Freq: Two times a day (BID) | ORAL | Status: DC
  Administered 2019-12-21 – 2019-12-23 (×4): 100 mg via ORAL
  Filled 2019-12-21 (×4): qty 1

## 2019-12-21 MED ORDER — OXYCODONE HCL 5 MG PO TABS
5.0000 mg | ORAL_TABLET | ORAL | Status: DC | PRN
  Administered 2019-12-22 (×2): 10 mg via ORAL
  Administered 2019-12-22: 21:00:00 5 mg via ORAL
  Administered 2019-12-23: 10 mg via ORAL
  Administered 2019-12-23: 04:00:00 5 mg via ORAL
  Filled 2019-12-21 (×3): qty 2
  Filled 2019-12-21 (×2): qty 1
  Filled 2019-12-21: qty 2

## 2019-12-21 MED ORDER — LORATADINE 10 MG PO TABS
10.0000 mg | ORAL_TABLET | Freq: Every day | ORAL | Status: DC
  Administered 2019-12-22 – 2019-12-23 (×2): 10 mg via ORAL
  Filled 2019-12-21 (×2): qty 1

## 2019-12-21 MED ORDER — SODIUM CHLORIDE 0.9 % IR SOLN
Status: DC | PRN
  Administered 2019-12-21: 3000 mL

## 2019-12-21 MED ORDER — CARVEDILOL 3.125 MG PO TABS
3.1250 mg | ORAL_TABLET | Freq: Two times a day (BID) | ORAL | Status: DC
  Administered 2019-12-21 – 2019-12-23 (×3): 3.125 mg via ORAL
  Filled 2019-12-21 (×4): qty 1

## 2019-12-21 MED ORDER — PROPOFOL 500 MG/50ML IV EMUL
INTRAVENOUS | Status: DC | PRN
  Administered 2019-12-21: 75 ug/kg/min via INTRAVENOUS

## 2019-12-21 MED ORDER — DARIFENACIN HYDROBROMIDE ER 7.5 MG PO TB24
7.5000 mg | ORAL_TABLET | Freq: Every day | ORAL | Status: DC
  Administered 2019-12-22 – 2019-12-23 (×2): 7.5 mg via ORAL
  Filled 2019-12-21 (×2): qty 1

## 2019-12-21 MED ORDER — POLYVINYL ALCOHOL 1.4 % OP SOLN
1.0000 [drp] | OPHTHALMIC | Status: DC | PRN
  Filled 2019-12-21: qty 15

## 2019-12-21 MED ORDER — VANCOMYCIN HCL IN DEXTROSE 1-5 GM/200ML-% IV SOLN
1000.0000 mg | INTRAVENOUS | Status: AC
  Filled 2019-12-21: qty 200

## 2019-12-21 MED ORDER — POLYETHYLENE GLYCOL 3350 17 G PO PACK: 17.0000 g | PACK | Freq: Every day | ORAL | Status: DC | PRN

## 2019-12-21 MED ORDER — VANCOMYCIN HCL IN DEXTROSE 1-5 GM/200ML-% IV SOLN
INTRAVENOUS | Status: AC
  Administered 2019-12-21: 1000 mg via INTRAVENOUS
  Filled 2019-12-21: qty 200

## 2019-12-21 MED ORDER — TRANEXAMIC ACID-NACL 1000-0.7 MG/100ML-% IV SOLN
INTRAVENOUS | Status: AC
  Filled 2019-12-21: qty 200

## 2019-12-21 MED ORDER — HYDROCHLOROTHIAZIDE 25 MG PO TABS
25.0000 mg | ORAL_TABLET | Freq: Every day | ORAL | Status: DC
  Administered 2019-12-21 – 2019-12-23 (×3): 25 mg via ORAL
  Filled 2019-12-21 (×3): qty 1

## 2019-12-21 MED ORDER — PHENYLEPHRINE HCL-NACL 10-0.9 MG/250ML-% IV SOLN
INTRAVENOUS | Status: DC | PRN
  Administered 2019-12-21: 25 ug/min via INTRAVENOUS

## 2019-12-21 MED ORDER — POTASSIUM CHLORIDE CRYS ER 10 MEQ PO TBCR
10.0000 meq | EXTENDED_RELEASE_TABLET | Freq: Every day | ORAL | Status: DC
  Administered 2019-12-21 – 2019-12-23 (×3): 10 meq via ORAL
  Filled 2019-12-21 (×5): qty 1

## 2019-12-21 MED ORDER — MIDAZOLAM HCL 2 MG/2ML IJ SOLN
INTRAMUSCULAR | Status: AC
  Administered 2019-12-21: 13:00:00 1 mg via INTRAVENOUS
  Filled 2019-12-21: qty 2

## 2019-12-21 MED ORDER — ONE-DAILY MULTI VITAMINS PO TABS: 1.0000 | ORAL_TABLET | Freq: Every day | ORAL | Status: DC

## 2019-12-21 MED ORDER — ZINC SULFATE 220 (50 ZN) MG PO CAPS
220.0000 mg | ORAL_CAPSULE | Freq: Every day | ORAL | Status: DC
  Administered 2019-12-22 – 2019-12-23 (×2): 220 mg via ORAL
  Filled 2019-12-21 (×3): qty 1

## 2019-12-21 MED ORDER — MUPIROCIN 2 % EX OINT
1.0000 "application " | TOPICAL_OINTMENT | Freq: Two times a day (BID) | CUTANEOUS | Status: DC
  Administered 2019-12-21 – 2019-12-23 (×4): 1 via NASAL
  Filled 2019-12-21 (×2): qty 22

## 2019-12-21 MED ORDER — SOOTHE XP OP SOLN: Freq: Every day | OPHTHALMIC | Status: DC | PRN

## 2019-12-21 MED ORDER — FERROUS SULFATE 325 (65 FE) MG PO TABS
325.0000 mg | ORAL_TABLET | Freq: Three times a day (TID) | ORAL | Status: DC
  Administered 2019-12-21 – 2019-12-23 (×5): 325 mg via ORAL
  Filled 2019-12-21 (×6): qty 1

## 2019-12-21 MED ORDER — PHENOL 1.4 % MT LIQD: 1.0000 | OROMUCOSAL | Status: DC | PRN

## 2019-12-21 MED ORDER — TRANEXAMIC ACID-NACL 1000-0.7 MG/100ML-% IV SOLN
INTRAVENOUS | Status: DC | PRN
  Administered 2019-12-21: 1000 mg via INTRAVENOUS

## 2019-12-21 MED ORDER — PHENYLEPHRINE 40 MCG/ML (10ML) SYRINGE FOR IV PUSH (FOR BLOOD PRESSURE SUPPORT)
PREFILLED_SYRINGE | INTRAVENOUS | Status: AC
  Filled 2019-12-21: qty 10

## 2019-12-21 MED ORDER — ALUM & MAG HYDROXIDE-SIMETH 200-200-20 MG/5ML PO SUSP: 30.0000 mL | ORAL | Status: DC | PRN

## 2019-12-21 MED ORDER — GABAPENTIN 100 MG PO CAPS
200.0000 mg | ORAL_CAPSULE | Freq: Four times a day (QID) | ORAL | Status: DC
  Administered 2019-12-21 – 2019-12-22 (×5): 200 mg via ORAL
  Filled 2019-12-21 (×7): qty 2

## 2019-12-21 MED ORDER — METOCLOPRAMIDE HCL 5 MG PO TABS: 5.0000 mg | ORAL_TABLET | Freq: Three times a day (TID) | ORAL | Status: DC | PRN

## 2019-12-21 MED ORDER — FENTANYL CITRATE (PF) 100 MCG/2ML IJ SOLN: 50.0000 ug | Freq: Once | INTRAMUSCULAR | Status: AC

## 2019-12-21 SURGICAL SUPPLY — 83 items
APL SKNCLS STERI-STRIP NONHPOA (GAUZE/BANDAGES/DRESSINGS) ×1
ATTUNE PS FEM RT SZ 4 CEM KNEE (Femur) ×1 IMPLANT
ATTUNE PSRP INSR SZ4 5 KNEE (Insert) ×1 IMPLANT
BANDAGE ESMARK 6X9 LF (GAUZE/BANDAGES/DRESSINGS) ×1 IMPLANT
BASE TIBIAL ROT PLAT SZ 5 KNEE (Knees) IMPLANT
BENZOIN TINCTURE PRP APPL 2/3 (GAUZE/BANDAGES/DRESSINGS) ×2 IMPLANT
BLADE SAGITTAL 25.0X1.19X90 (BLADE) ×2 IMPLANT
BLADE SAW SGTL 13X75X1.27 (BLADE) ×2 IMPLANT
BLADE SURG 10 STRL SS (BLADE) ×1 IMPLANT
BNDG CMPR 9X6 STRL LF SNTH (GAUZE/BANDAGES/DRESSINGS) ×1
BNDG ELASTIC 4X5.8 VLCR STR LF (GAUZE/BANDAGES/DRESSINGS) ×2 IMPLANT
BNDG ELASTIC 6X5.8 VLCR STR LF (GAUZE/BANDAGES/DRESSINGS) ×1 IMPLANT
BNDG ESMARK 6X9 LF (GAUZE/BANDAGES/DRESSINGS) ×2
BOWL SMART MIX CTS (DISPOSABLE) ×2 IMPLANT
BSPLAT TIB 5 CMNT ROT PLAT STR (Knees) ×1 IMPLANT
CEMENT HV SMART SET (Cement) ×4 IMPLANT
COVER SURGICAL LIGHT HANDLE (MISCELLANEOUS) ×2 IMPLANT
COVER WAND RF STERILE (DRAPES) ×2 IMPLANT
CUFF TOURN SGL QUICK 34 (TOURNIQUET CUFF) ×2
CUFF TOURN SGL QUICK 42 (TOURNIQUET CUFF) IMPLANT
CUFF TRNQT CYL 34X4.125X (TOURNIQUET CUFF) ×1 IMPLANT
DRAPE ORTHO SPLIT 77X108 STRL (DRAPES) ×4
DRAPE SURG ORHT 6 SPLT 77X108 (DRAPES) ×2 IMPLANT
DRAPE U-SHAPE 47X51 STRL (DRAPES) ×2 IMPLANT
DRSG PAD ABDOMINAL 8X10 ST (GAUZE/BANDAGES/DRESSINGS) ×2 IMPLANT
DRSG XEROFORM 1X8 (GAUZE/BANDAGES/DRESSINGS) ×1 IMPLANT
DURAPREP 26ML APPLICATOR (WOUND CARE) ×4 IMPLANT
ELECT REM PT RETURN 9FT ADLT (ELECTROSURGICAL) ×2
ELECTRODE REM PT RTRN 9FT ADLT (ELECTROSURGICAL) ×1 IMPLANT
EVACUATOR 1/8 PVC DRAIN (DRAIN) IMPLANT
FACESHIELD WRAPAROUND (MASK) ×4 IMPLANT
FACESHIELD WRAPAROUND OR TEAM (MASK) ×2 IMPLANT
GAUZE SPONGE 4X4 12PLY STRL (GAUZE/BANDAGES/DRESSINGS) ×2 IMPLANT
GAUZE XEROFORM 5X9 LF (GAUZE/BANDAGES/DRESSINGS) ×2 IMPLANT
GLOVE BIOGEL PI IND STRL 8 (GLOVE) ×2 IMPLANT
GLOVE BIOGEL PI INDICATOR 8 (GLOVE) ×2
GLOVE ORTHO TXT STRL SZ7.5 (GLOVE) ×4 IMPLANT
GOWN STRL REUS W/ TWL LRG LVL3 (GOWN DISPOSABLE) ×1 IMPLANT
GOWN STRL REUS W/ TWL XL LVL3 (GOWN DISPOSABLE) ×1 IMPLANT
GOWN STRL REUS W/TWL 2XL LVL3 (GOWN DISPOSABLE) ×2 IMPLANT
GOWN STRL REUS W/TWL LRG LVL3 (GOWN DISPOSABLE) ×2
GOWN STRL REUS W/TWL XL LVL3 (GOWN DISPOSABLE) ×2
HANDPIECE INTERPULSE COAX TIP (DISPOSABLE) ×2
IMMOBILIZER KNEE 22 UNIV (SOFTGOODS) ×2 IMPLANT
KIT BASIN OR (CUSTOM PROCEDURE TRAY) ×2 IMPLANT
KIT TURNOVER KIT B (KITS) ×2 IMPLANT
MANIFOLD NEPTUNE II (INSTRUMENTS) ×2 IMPLANT
MARKER SKIN DUAL TIP RULER LAB (MISCELLANEOUS) ×2 IMPLANT
NDL 18GX1X1/2 (RX/OR ONLY) (NEEDLE) ×1 IMPLANT
NDL HYPO 25GX1X1/2 BEV (NEEDLE) ×1 IMPLANT
NDL SPNL 18GX3.5 QUINCKE PK (NEEDLE) IMPLANT
NEEDLE 18GX1X1/2 (RX/OR ONLY) (NEEDLE) ×2 IMPLANT
NEEDLE HYPO 25GX1X1/2 BEV (NEEDLE) ×2 IMPLANT
NEEDLE SPNL 18GX3.5 QUINCKE PK (NEEDLE) ×2 IMPLANT
NS IRRIG 1000ML POUR BTL (IV SOLUTION) ×2 IMPLANT
PACK TOTAL JOINT (CUSTOM PROCEDURE TRAY) ×2 IMPLANT
PAD ABD 8X10 STRL (GAUZE/BANDAGES/DRESSINGS) ×1 IMPLANT
PAD ARMBOARD 7.5X6 YLW CONV (MISCELLANEOUS) ×4 IMPLANT
PAD CAST 4YDX4 CTTN HI CHSV (CAST SUPPLIES) ×1 IMPLANT
PADDING CAST COTTON 4X4 STRL (CAST SUPPLIES) ×2
PADDING CAST COTTON 6X4 STRL (CAST SUPPLIES) ×2 IMPLANT
PATELLA MEDIAL ATTUN 35MM KNEE (Knees) ×1 IMPLANT
PIN DRILL FIX HALF THREAD (BIT) ×1 IMPLANT
PIN STEINMAN FIXATION KNEE (PIN) ×1 IMPLANT
SET HNDPC FAN SPRY TIP SCT (DISPOSABLE) ×1 IMPLANT
STAPLER VISISTAT 35W (STAPLE) IMPLANT
STRIP CLOSURE SKIN 1/2X4 (GAUZE/BANDAGES/DRESSINGS) ×2 IMPLANT
SUCTION FRAZIER HANDLE 10FR (MISCELLANEOUS) ×2
SUCTION TUBE FRAZIER 10FR DISP (MISCELLANEOUS) ×1 IMPLANT
SUT MNCRL AB 3-0 PS2 18 (SUTURE) ×2 IMPLANT
SUT VIC AB 0 CT1 27 (SUTURE) ×2
SUT VIC AB 0 CT1 27XBRD ANBCTR (SUTURE) ×1 IMPLANT
SUT VIC AB 1 CTX 36 (SUTURE) ×4
SUT VIC AB 1 CTX36XBRD ANBCTR (SUTURE) ×2 IMPLANT
SUT VIC AB 2-0 CT1 27 (SUTURE) ×4
SUT VIC AB 2-0 CT1 TAPERPNT 27 (SUTURE) ×2 IMPLANT
SUT VIC AB 3-0 X1 27 (SUTURE) ×2 IMPLANT
SYR 50ML LL SCALE MARK (SYRINGE) ×2 IMPLANT
SYR CONTROL 10ML LL (SYRINGE) ×2 IMPLANT
TIBIAL BASE ROT PLAT SZ 5 KNEE (Knees) ×2 IMPLANT
TOWEL GREEN STERILE (TOWEL DISPOSABLE) ×2 IMPLANT
TOWEL GREEN STERILE FF (TOWEL DISPOSABLE) ×2 IMPLANT
TRAY CATH 16FR W/PLASTIC CATH (SET/KITS/TRAYS/PACK) ×1 IMPLANT

## 2019-12-21 NOTE — Progress Notes (Signed)
Patient had a Covid test 5 days ago on 12/16/2019. Patient stated she quarantined and stayed home once she was tested. Per Dr. Glennon Mac, no re-test needed. Dr. Ermalene Postin made aware.  Also, patient stated she has no restrictions to any of her extremities and that it is okay to get an IV started on her right hand.

## 2019-12-21 NOTE — Progress Notes (Signed)
Orthopedic Tech Progress Note Patient Details:  Desiree [name] 14-Apr-1946 [id number]  Ortho Devices Type of Ortho Device: Bone foam zero knee Ortho Device/Splint Location: RLE Ortho Device/Splint Interventions: Application   Post Interventions Patient Tolerated: Well Instructions Provided: Care of device   Desiree [name] Desiree [name] 12/21/2019, 3:34 PM

## 2019-12-21 NOTE — Anesthesia Procedure Notes (Signed)
Anesthesia Regional Block: Adductor canal block   Pre-Anesthetic Checklist: ,, timeout performed, Correct Patient, Correct Site, Correct Laterality, Correct Procedure, Correct Position, site marked, Risks and benefits discussed,  Surgical consent,  Pre-op evaluation,  At surgeon's request and post-op pain management  Laterality: Right and Lower  Prep: chloraprep       Needles:  Injection technique: Single-shot     Needle Length: 9cm  Needle Gauge: 22     Additional Needles: Arrow StimuQuik ECHO Echogenic Stimulating PNB Needle  Procedures:,,,, ultrasound used (permanent image in chart),,,,  Narrative:  Start time: 12/21/2019 12:18 PM End time: 12/21/2019 12:22 PM Injection made incrementally with aspirations every 5 mL.  Performed by: Personally  Anesthesiologist: Oleta Mouse, MD

## 2019-12-21 NOTE — Op Note (Signed)
Preop diagnosis: Right knee primary osteoarthritis  Postop diagnosis: Same  Procedure: Right total knee arthroplasty, cemented.  Surgeon: Rodell Perna, MD  Assistant April Fulp, RNFA  Tourniquet time: 57 minutes x 350.  EBL: Minimal  Anesthesia spinal plus Marcaine 20 cc local plus Exparel 20 cc local.  Implants:Depuy Attune size 4 standard femur size 5 tibia.  5 mm thick size 4 rotating platform.  35 mm patella.  Smart set cement.  Procedure: After induction of spinal anesthesia lateral post proximal tourniquet heel bump DuraPrep from the tip of the toes the trunk and usual total knee sheets drapes sterile skin marker impervious stockinette and Coban were applied.  Timeout procedure completed.  Vancomycin was given to the patient preoperative positive PCR.  Leg was wrapped in Esmarch tourniquet inflated.  Midline incision was made medial parapatellar incision was made.  There is tricompartmental degenerative arthritis bone-on-bone changes in the lateral compartment marginal osteophytes were removed.  10 mm were taken off the femur after the patella was cut 10 mm from facet to facet.  Sizing and chamfer cuts were made on the femur as well as box cuts.  Initially 9 was resected off the tibia and additional 2 mm had to be taken later since with trials and it was tight and she would reach full extension but was bouncy and I was concerned patient would not be able to get full extension.  An additional 2 mm were removed and then with trials to reach full extension collateral ligaments were balanced in both flexion and extension.  Posterior spurs have been removed three-quarter curved osteotome.  Pulse lavage vacuum mixing of the cement cementing of the tibia followed by femur placement of the 5 mm rotating platform and then holding the patella with the patellar clamp all excessive cement was removed.  Exparel Marcaine was infiltrated while cement was hardening tourniquet was deflated hemostasis obtained  in standard layered closure with #1 Vicryl in the retinaculum 2 and subtenons tissue 3-0 Monocryl subcuticular skin closure tincture benzoin Steri-Strips postop dressing and knee immobilizer.

## 2019-12-21 NOTE — Anesthesia Procedure Notes (Signed)
Spinal  Patient location during procedure: OR Start time: 12/21/2019 1:04 PM End time: 12/21/2019 1:08 PM Staffing Performed: anesthesiologist  Anesthesiologist: Oleta Mouse, MD Preanesthetic Checklist Completed: patient identified, IV checked, risks and benefits discussed, surgical consent, monitors and equipment checked, pre-op evaluation and timeout performed Spinal Block Patient position: sitting Prep: DuraPrep Patient monitoring: heart rate, cardiac monitor, continuous pulse ox and blood pressure Approach: midline Location: L3-4 Injection technique: single-shot Needle Needle type: Pencan  Needle gauge: 24 G Needle length: 9 cm Assessment Sensory level: T6

## 2019-12-21 NOTE — Progress Notes (Signed)
PT Cancellation Note  Patient Details Name: Desiree [name] MRN: [id number] DOB: May 13, 1946   Cancelled Treatment:    Reason Eval/Treat Not Completed: Other (comment) Pt order released and received, however, not currently up to room. Will follow up as pt available and as schedule allows.   Lou Miner, DPT  Acute Rehabilitation Services  Pager: [phone] Office: [phone]    Rudean Hitt 12/21/2019, 5:19 PM

## 2019-12-21 NOTE — Interval H&P Note (Signed)
History and Physical Interval Note:  12/21/2019 12:29 PM  Desiree [name]  has presented today for surgery, with the diagnosis of right knee osteoarthritis.  The various methods of treatment have been discussed with the patient and family. After consideration of risks, benefits and other options for treatment, the patient has consented to  Procedure(s): RIGHT  TOTAL KNEE ARTHROPLASTY (Right) as a surgical intervention.  The patient's history has been reviewed, patient examined, no change in status, stable for surgery.  I have reviewed the patient's chart and labs.  Questions were answered to the patient's satisfaction.     [name]

## 2019-12-21 NOTE — Transfer of Care (Signed)
Immediate Anesthesia Transfer of Care Note  Patient: Desiree [name]  Procedure(s) Performed: RIGHT  TOTAL KNEE ARTHROPLASTY (Right Knee)  Patient Location: PACU  Anesthesia Type:Spinal and MAC combined with regional for post-op pain  Level of Consciousness: awake, alert  and oriented  Airway & Oxygen Therapy: Patient Spontanous Breathing and Patient connected to face mask oxygen  Post-op Assessment: Report given to RN and Post -op Vital signs reviewed and stable  Post vital signs: Reviewed and stable  Last Vitals:  Vitals Value Taken Time  BP 102/65 12/21/19 1501  Temp    Pulse 67 12/21/19 1504  Resp 14 12/21/19 1504  SpO2 100 % 12/21/19 1504  Vitals shown include unvalidated device data.  Last Pain:  Vitals:   12/21/19 1240  TempSrc:   PainSc: 8          Complications: No apparent anesthesia complications

## 2019-12-21 NOTE — Anesthesia Procedure Notes (Signed)
Procedure Name: MAC Date/Time: 12/21/2019 1:08 PM Performed by: Candis Shine, CRNA Pre-anesthesia Checklist: Patient identified, Emergency Drugs available, Suction available, Patient being monitored and Timeout performed Patient Re-evaluated:Patient Re-evaluated prior to induction Oxygen Delivery Method: Simple face mask Dental Injury: Teeth and Oropharynx as per pre-operative assessment

## 2019-12-22 ENCOUNTER — Encounter: Payer: Self-pay | Admitting: *Deleted

## 2019-12-22 DIAGNOSIS — M1711 Unilateral primary osteoarthritis, right knee: Secondary | ICD-10-CM | POA: Diagnosis not present

## 2019-12-22 LAB — CBC
HCT: 30.5 % — ABNORMAL LOW (ref 36.0–46.0)
Hemoglobin: 9.8 g/dL — ABNORMAL LOW (ref 12.0–15.0)
MCH: 28.5 pg (ref 26.0–34.0)
MCHC: 32.1 g/dL (ref 30.0–36.0)
MCV: 88.7 fL (ref 80.0–100.0)
Platelets: 263 10*3/uL (ref 150–400)
RBC: 3.44 MIL/uL — ABNORMAL LOW (ref 3.87–5.11)
RDW: 14.6 % (ref 11.5–15.5)
WBC: 7.5 10*3/uL (ref 4.0–10.5)
nRBC: 0 % (ref 0.0–0.2)

## 2019-12-22 LAB — GLUCOSE, CAPILLARY: Glucose-Capillary: 163 mg/dL — ABNORMAL HIGH (ref 70–99)

## 2019-12-22 MED ORDER — METHOCARBAMOL 500 MG PO TABS: 500.0000 mg | ORAL_TABLET | Freq: Three times a day (TID) | ORAL | 0 refills | Status: DC | PRN

## 2019-12-22 MED ORDER — HYDROCODONE-ACETAMINOPHEN 7.5-325 MG PO TABS: 1.0000 | ORAL_TABLET | Freq: Four times a day (QID) | ORAL | 0 refills | Status: DC | PRN

## 2019-12-22 NOTE — Progress Notes (Signed)
Lack of mobility prevented discharge today with decreased BP. Was obs. Status . Should be mobile so she can go home Friday.

## 2019-12-22 NOTE — Evaluation (Signed)
Occupational Therapy Evaluation Patient Details Name: Desiree [name] MRN: [id number] DOB: 07-Dec-1945 Today's Date: 12/22/2019    History of Present Illness Pt 74 y.o. F admitted 12/21/2019 s/p R TKA secondary to knee osteoarthritis. PMHx includes CVA, breast cancer, DDD, Thoracic ascending aortic aneurysm.   Clinical Impression   PTA, pt was living alone and was independent. Pt currently requiring Min Guard A for ADLs and functional transfers; with exception of donning/doffing socks and shoes in which pt requires Max A. Pt's daughter reporting she can assist with socks and shoes. Provided education on compensatory techniques for LB dressing and pt donned her underwear with Min guard A and RW. Pt would benefit from further acute OT to facilitate safe dc. Recommend dc to home with HHOT for further OT to optimize safety, independence with ADLs, and return to PLOF.     Follow Up Recommendations  Home health OT;Supervision/Assistance - 24 hour    Equipment Recommendations  3 in 1 bedside commode    Recommendations for Other Services PT consult     Precautions / Restrictions Precautions Precautions: Fall;Other (comment) Precaution Comments: Watch BP Restrictions Weight Bearing Restrictions: Yes RLE Weight Bearing: Weight bearing as tolerated      Mobility Bed Mobility               General bed mobility comments: Seated in recliner  Transfers Overall transfer level: Needs assistance Equipment used: Rolling Rufer (2 wheeled) Transfers: Sit to/from Stand Sit to Stand: Min guard         General transfer comment: Min guard A for safety    Balance Overall balance assessment: Needs assistance Sitting-balance support: Bilateral upper extremity supported;Feet supported Sitting balance-Leahy Scale: Fair Sitting balance - Comments: Pt required UE support to sit EOB   Standing balance support: Single extremity supported;During functional activity Standing balance-Leahy  Scale: Poor Standing balance comment: Reliant on UE support during LB dressing                           ADL either performed or assessed with clinical judgement   ADL Overall ADL's : Needs assistance/impaired Eating/Feeding: Set up;Sitting   Grooming: Set up;Sitting   Upper Body Bathing: Supervision/ safety;Set up;Sitting   Lower Body Bathing: Min guard;Sit to/from stand   Upper Body Dressing : Supervision/safety;Set up;Sitting   Lower Body Dressing: Min guard;Maximal assistance;Sit to/from stand Lower Body Dressing Details (indicate cue type and reason): Pt able to don underwear with Min guard A for safety in standing. Providing education on donning RLE first. Max A for managing socks and shoes; pt's daughter reporting she can assist Toilet Transfer: Min guard;RW(sit<>stand at [employer]) Armed forces technical officer Details (indicate cue type and reason): Min Guard A for sit<>stand at [employer]. Pt recently performed stand pivot to PT with Min guard A (finishing PT session as OT arrived)         Functional mobility during ADLs: Min guard(sit<>stand at recliner) General ADL Comments: Pt demosntrating decreased strength, balance, and activity tolerance with drowsiness     Vision         Perception     Praxis      Pertinent Vitals/Pain Pain Assessment: Faces Faces Pain Scale: Hurts little more Pain Location: R knee Pain Descriptors / Indicators: Discomfort;Grimacing;Guarding Pain Intervention(s): Monitored during session;Limited activity within patient's tolerance;Repositioned     Hand Dominance Right   Extremity/Trunk Assessment Upper Extremity Assessment Upper Extremity Assessment: Overall WFL for tasks assessed   Lower Extremity Assessment  Lower Extremity Assessment: Defer to PT evaluation RLE: Unable to fully assess due to pain   Cervical / Trunk Assessment Cervical / Trunk Assessment: Other exceptions Cervical / Trunk Exceptions: Increased body habitus    Communication Communication Communication: No difficulties   Cognition Arousal/Alertness: Lethargic;Suspect due to medications Behavior During Therapy: [facility] for tasks assessed/performed Overall Cognitive Status: Within Functional Limits for tasks assessed                                 General Comments: Requiring increased time for drowsiness. Falling in and out of sleep during conversation. Staying awake while performuing functional task.    General Comments  Daughter present throughout session. BP 117/71 at end of session    Exercises Other Exercises Other Exercises: RLE partial LAQ, prolonged sitting at edge of recliner for gravity-assisted R knee flexion; tolerated well   Shoulder Instructions      Home Living Family/patient expects to be discharged to:: Private residence Living Arrangements: Alone Available Help at Discharge: Family;Available 24 hours/day Type of Home: House Home Access: Stairs to enter [employer] of Steps: 2 Entrance Stairs-Rails: Can reach both Home Layout: One level     Bathroom Shower/Tub: Teacher, early years/pre: Standard Bathroom Accessibility: Yes   Home Equipment: Cane - single point   Additional Comments: She has 4 children, one of which lives close by and can stay with her at her home      Prior Functioning/Environment Level of Independence: Independent        Comments: Pt likes to bake cakes        OT Problem List: Decreased strength;Decreased range of motion;Decreased activity tolerance;Impaired balance (sitting and/or standing);Decreased knowledge of use of DME or AE;Decreased knowledge of precautions;Pain      OT Treatment/Interventions: Self-care/ADL training;Therapeutic exercise;Energy conservation;DME and/or AE instruction;Therapeutic activities;Patient/family education    OT Goals(Current goals can be found in the care plan section) Acute Rehab OT Goals Patient Stated Goal: To return  home OT Goal Formulation: With patient/family Time For Goal Achievement: 01/05/20 Potential to Achieve Goals: Good  OT Frequency: Min 3X/week   Barriers to D/C:            Co-evaluation              AM-PAC OT "6 Clicks" Daily Activity     Outcome Measure Help from another person eating meals?: A Little Help from another person taking care of personal grooming?: A Little Help from another person toileting, which includes using toliet, bedpan, or urinal?: A Little Help from another person bathing (including washing, rinsing, drying)?: A Little Help from another person to put on and taking off regular upper body clothing?: A Little Help from another person to put on and taking off regular lower body clothing?: A Little 6 Click Score: 18   End of Session    Activity Tolerance:   Patient left:    OT Visit Diagnosis: Unsteadiness on feet (R26.81);Other abnormalities of gait and mobility (R26.89);Muscle weakness (generalized) (M62.81);Pain Pain - Right/Left: Right Pain - part of body: Knee                Time: [id number] OT Time Calculation (min): 34 min Charges:  OT General Charges $OT Visit: 1 Visit OT Evaluation $OT Eval Low Complexity: 1 Low OT Treatments $Self Care/Home Management : 8-22 mins  Lylla Eifler MSOT, OTR/L Acute Rehab Pager: [phone] Office: [place]  Edwen Mclester 12/22/2019, 6:00 PM

## 2019-12-22 NOTE — Discharge Instructions (Signed)
INSTRUCTIONS AFTER JOINT REPLACEMENT   o Remove items at home which could result in a fall. This includes throw rugs or furniture in walking pathways o ICE to the affected joint every three hours while awake for 30 minutes at a time, for at least the first 3-5 days, and then as needed for pain and swelling.  Continue to use ice for pain and swelling. You may notice swelling that will progress down to the foot and ankle.  This is normal after surgery.  Elevate your leg when you are not up walking on it.   o Continue to use the breathing machine you got in the hospital (incentive spirometer) which will help keep your temperature down.  It is common for your temperature to cycle up and down following surgery, especially at night when you are not up moving around and exerting yourself.  The breathing machine keeps your lungs expanded and your temperature down.   DIET:  As you were doing prior to hospitalization, we recommend a well-balanced diet.  DRESSING / WOUND CARE / SHOWERING  Keep the surgical dressing until follow up.  The dressing is water proof, so you can shower without any extra covering.  IF THE DRESSING FALLS OFF or the wound gets wet inside, change the dressing with sterile gauze.  Please use good hand washing techniques before changing the dressing.  Do not use any lotions or creams on the incision until instructed by your surgeon.    ACTIVITY  o Increase activity slowly as tolerated, but follow the weight bearing instructions below.   o No driving for 6 weeks or until further direction given by your physician.  You cannot drive while taking narcotics.  o No lifting or carrying greater than 10 lbs. until further directed by your surgeon. o Avoid periods of inactivity such as sitting longer than an hour when not asleep. This helps prevent blood clots.  o You may return to work once you are authorized by your doctor.     WEIGHT BEARING   Weight bearing as tolerated with assist  device (Avants, cane, etc) as directed, use it as long as suggested by your surgeon or therapist, typically at least 4-6 weeks.   EXERCISES  Results after joint replacement surgery are often greatly improved when you follow the exercise, range of motion and muscle strengthening exercises prescribed by your doctor. Safety measures are also important to protect the joint from further injury. Any time any of these exercises cause you to have increased pain or swelling, decrease what you are doing until you are comfortable again and then slowly increase them. If you have problems or questions, call your caregiver or physical therapist for advice.   Rehabilitation is important following a joint replacement. After just a few days of immobilization, the muscles of the leg can become weakened and shrink (atrophy).  These exercises are designed to build up the tone and strength of the thigh and leg muscles and to improve motion. Often times heat used for twenty to thirty minutes before working out will loosen up your tissues and help with improving the range of motion but do not use heat for the first two weeks following surgery (sometimes heat can increase post-operative swelling).   These exercises can be done on a training (exercise) mat, on the floor, on a table or on a bed. Use whatever works the best and is most comfortable for you.    Use music or television while you are exercising so that   the exercises are a pleasant break in your day. This will make your life better with the exercises acting as a break in your routine that you can look forward to.   Perform all exercises about fifteen times, three times per day or as directed.  You should exercise both the operative leg and the other leg as well.  Exercises include:   . Quad Sets - Tighten up the muscle on the front of the thigh (Quad) and hold for 5-10 seconds.   . Straight Leg Raises - With your knee straight (if you were given a brace, keep it on),  lift the leg to 60 degrees, hold for 3 seconds, and slowly lower the leg.  Perform this exercise against resistance later as your leg gets stronger.  . Leg Slides: Lying on your back, slowly slide your foot toward your buttocks, bending your knee up off the floor (only go as far as is comfortable). Then slowly slide your foot back down until your leg is flat on the floor again.  . Angel Wings: Lying on your back spread your legs to the side as far apart as you can without causing discomfort.  . Hamstring Strength:  Lying on your back, push your heel against the floor with your leg straight by tightening up the muscles of your buttocks.  Repeat, but this time bend your knee to a comfortable angle, and push your heel against the floor.  You may put a pillow under the heel to make it more comfortable if necessary.   A rehabilitation program following joint replacement surgery can speed recovery and prevent re-injury in the future due to weakened muscles. Contact your doctor or a physical therapist for more information on knee rehabilitation.    CONSTIPATION  Constipation is defined medically as fewer than three stools per week and severe constipation as less than one stool per week.  Even if you have a regular bowel pattern at home, your normal regimen is likely to be disrupted due to multiple reasons following surgery.  Combination of anesthesia, postoperative narcotics, change in appetite and fluid intake all can affect your bowels.   YOU MUST use at least one of the following options; they are listed in order of increasing strength to get the job done.  They are all available over the counter, and you may need to use some, POSSIBLY even all of these options:    Drink plenty of fluids (prune juice may be helpful) and high fiber foods Colace 100 mg by mouth twice a day  Senokot for constipation as directed and as needed Dulcolax (bisacodyl), take with full glass of water  Miralax (polyethylene glycol)  once or twice a day as needed.  If you have tried all these things and are unable to have a bowel movement in the first 3-4 days after surgery call either your surgeon or your primary doctor.    If you experience loose stools or diarrhea, hold the medications until you stool forms back up.  If your symptoms do not get better within 1 week or if they get worse, check with your doctor.  If you experience "the worst abdominal pain ever" or develop nausea or vomiting, please contact the office immediately for further recommendations for treatment.   ITCHING:  If you experience itching with your medications, try taking only a single pain pill, or even half a pain pill at a time.  You can also use Benadryl over the counter for itching or also to   help with sleep.   TED HOSE STOCKINGS:  Use stockings on both legs until for at least 2 weeks or as directed by physician office. They may be removed at night for sleeping.  MEDICATIONS:  See your medication summary on the "After Visit Summary" that nursing will review with you.  You may have some home medications which will be placed on hold until you complete the course of blood thinner medication.  It is important for you to complete the blood thinner medication as prescribed.  PRECAUTIONS:  If you experience chest pain or shortness of breath - call 911 immediately for transfer to the hospital emergency department.   If you develop a fever greater that 101 F, purulent drainage from wound, increased redness or drainage from wound, foul odor from the wound/dressing, or calf pain - CONTACT YOUR SURGEON.                                                   FOLLOW-UP APPOINTMENTS:  If you do not already have a post-op appointment, please call the office for an appointment to be seen by your surgeon.  Guidelines for how soon to be seen are listed in your "After Visit Summary", but are typically between 1-4 weeks after surgery.  OTHER INSTRUCTIONS:   Knee  Replacement:  Do not place pillow under knee, focus on keeping the knee straight while resting. CPM instructions: 0-90 degrees, 2 hours in the morning, 2 hours in the afternoon, and 2 hours in the evening. Place foam block, curve side up under heel at all times except when in CPM or when walking.  DO NOT modify, tear, cut, or change the foam block in any way.   DENTAL ANTIBIOTICS:  In most cases prophylactic antibiotics for Dental procdeures after total joint surgery are not necessary.  Exceptions are as follows:  1. History of prior total joint infection  2. Severely immunocompromised (Organ Transplant, cancer chemotherapy, Rheumatoid biologic meds such as Humera)  3. Poorly controlled diabetes (A1C &gt; 8.0, blood glucose over 200)  If you have one of these conditions, contact your surgeon for an antibiotic prescription, prior to your dental procedure.   MAKE SURE YOU:  . Understand these instructions.  . Get help right away if you are not doing well or get worse.    Thank you for letting us be a part of your medical care team.  It is a privilege we respect greatly.  We hope these instructions will help you stay on track for a fast and full recovery!   

## 2019-12-22 NOTE — Plan of Care (Signed)

## 2019-12-22 NOTE — Evaluation (Signed)
Physical Therapy Evaluation Patient Details Name: Desiree [name] MRN: [id number] DOB: 12/29/1945 Today's Date: 12/22/2019   History of Present Illness  Pt 74 y.o. F admitted 12/21/2019 s/p R TKA secondary to knee osteoarthritis. PMHx includes CVA, breast cancer, DDD, Thoracic ascending aortic aneurysm.  Clinical Impression  Pt presents with an overall decrease in functional mobility, generalized RLE weakness and pain secondary to above. Educ on precautions, positioning, and importance of mobility. Today, pt able to perform min(A) bed mobility, transfer bed to chair Min guard with total assist at end of transfer due to possible near syncopal episode(?). BP 106/61 after return to recliner.  Will see Pt second time this afternoon to amb and teach LE therex. Pt would benefit from continued acute PT services to maximize functional mobility and independence prior to d/c home.     Follow Up Recommendations Home health PT;Follow surgeon's recommendation for DC plan and follow-up therapies;Supervision/Assistance - 24 hour    Equipment Recommendations  Rolling Darr with 5" wheels;3in1 (PT)    Recommendations for Other Services       Precautions / Restrictions Precautions Precautions: Fall Precaution Comments: Near syncopal episode? with transition from bed to chair Restrictions Weight Bearing Restrictions: Yes RLE Weight Bearing: Weight bearing as tolerated      Mobility  Bed Mobility Overal bed mobility: Needs Assistance Bed Mobility: Supine to Sit     Supine to sit: Min assist     General bed mobility comments: Min(A) for RLE managment supine to sit EOB  Transfers Overall transfer level: Needs assistance Equipment used: Rolling [place] (2 wheeled) Transfers: Sit to/from Stand Sit to Stand: Min guard;+2 physical assistance         General transfer comment: Pt required Min guard of 2 for safety due to observed unsteadiness, dizziness with transitional  movements.  Ambulation/Gait Ambulation/Gait assistance: Min guard;+2 safety/equipment Gait Distance (Feet): 5 Feet Assistive device: Rolling Dotson (2 wheeled) Gait Pattern/deviations: Step-to pattern;Decreased stride length;Wide base of support     General Gait Details: Pt amb 2 steps prior to near syncopal episode? Pt acknowledged WBAT precautions and maintained during amb. Pt required total assist from stand to sit in recliner chair. Pt provided cool wash cloth, BP 106/61, O2 95 RA.  Stairs            Wheelchair Mobility    Modified Rankin (Stroke Patients Only)       Balance Overall balance assessment: Needs assistance Sitting-balance support: Bilateral upper extremity supported;Feet supported Sitting balance-Leahy Scale: Poor Sitting balance - Comments: Pt required UE support to sit EOB     Standing balance-Leahy Scale: Poor Standing balance comment: Pt requires BUE support with RW and min guard for saftey with standing balance                             Pertinent Vitals/Pain Pain Assessment: 0-10 Pain Score: 7  Pain Location: R knee Pain Descriptors / Indicators: Discomfort;Grimacing;Guarding Pain Intervention(s): Monitored during session;Ice applied;Repositioned;Patient requesting pain meds-RN notified    Home Living Family/patient expects to be discharged to:: Private residence Living Arrangements: Alone Available Help at Discharge: Family;Available 24 hours/day Type of Home: House Home Access: Stairs to enter Entrance Stairs-Rails: Can reach both Entrance Stairs-Number of Steps: 2 Home Layout: One level Home Equipment: Cane - single point Additional Comments: She has 4 children, one of which lives close by and can stay with her at her home    Prior Function Level of  Independence: Independent         Comments: Pt likes to bake cakes     Hand Dominance        Extremity/Trunk Assessment   Upper Extremity Assessment Upper  Extremity Assessment: Overall WFL for tasks assessed    Lower Extremity Assessment Lower Extremity Assessment: Generalized weakness;RLE deficits/detail RLE: Unable to fully assess due to pain       Communication   Communication: No difficulties  Cognition Arousal/Alertness: Awake/alert Behavior During Therapy: WFL for tasks assessed/performed Overall Cognitive Status: Within Functional Limits for tasks assessed                                        General Comments General comments (skin integrity, edema, etc.): RLE edema.    Exercises     Assessment/Plan    PT Assessment Patient needs continued PT services  PT Problem List Decreased strength;Pain;Decreased range of motion;Decreased balance;Decreased mobility       PT Treatment Interventions DME instruction;Therapeutic exercise;Gait training;Stair training;Functional mobility training;Therapeutic activities;Patient/family education    PT Goals (Current goals can be found in the Care Plan section)  Acute Rehab PT Goals Patient Stated Goal: To return home PT Goal Formulation: With patient Time For Goal Achievement: 12/30/19 Potential to Achieve Goals: Good    Frequency Min 5X/week   Barriers to discharge   No anticipated barriers to d/c    Co-evaluation               AM-PAC PT "6 Clicks" Mobility  Outcome Measure Help needed turning from your back to your side while in a flat bed without using bedrails?: None Help needed moving from lying on your back to sitting on the side of a flat bed without using bedrails?: A Little Help needed moving to and from a bed to a chair (including a wheelchair)?: A Little Help needed standing up from a chair using your arms (e.g., wheelchair or bedside chair)?: A Little Help needed to walk in hospital room?: A Little Help needed climbing 3-5 steps with a railing? : A Lot 6 Click Score: 18    End of Session Equipment Utilized During Treatment: Gait  belt Activity Tolerance: Treatment limited secondary to medical complications (Comment) Patient left: in chair;with call bell/phone within reach;with chair alarm set Nurse Communication: Mobility status;Patient requests pain meds;Other (comment)(alerted nurse to near syncopal? episode during amb) PT Visit Diagnosis: Pain;Muscle weakness (generalized) (M62.81);Unsteadiness on feet (R26.81) Pain - Right/Left: Right Pain - part of body: Knee    Time: [id number] PT Time Calculation (min) (ACUTE ONLY): 29 min   Charges:   PT Evaluation $PT Eval Moderate Complexity: 1 Mod PT Treatments $Therapeutic Activity: 8-22 mins        [employer] SPT 12/22/2019   Rolland Porter 12/22/2019, 12:30 PM

## 2019-12-22 NOTE — Progress Notes (Signed)
   Subjective: 1 Day Post-Op Procedure(s) (LRB): RIGHT  TOTAL KNEE ARTHROPLASTY (Right) Patient reports pain as moderate.    Objective: Vital signs in last 24 hours: Temp:  [97.2 F (36.2 C)-98.9 F (37.2 C)] 98.6 F (37 C) (04/29 0304) Pulse Rate:  [55-77] 72 (04/29 0304) Resp:  [11-19] 18 (04/29 0304) BP: (102-155)/(59-85) 119/64 (04/29 0304) SpO2:  [90 %-100 %] 98 % (04/29 0304) Weight:  [86.2 kg] 86.2 kg (04/28 1043)  Intake/Output from previous day: 04/28 0701 - 04/29 0700 In: 790 [I.V.:790] Out: 625 [Urine:600; Blood:25] Intake/Output this shift: Total I/O In: 360 [P.O.:360] Out: -   Recent Labs    12/22/19 0158  HGB 9.8*   Recent Labs    12/22/19 0158  WBC 7.5  RBC 3.44*  HCT 30.5*  PLT 263   No results for input(s): NA, K, CL, CO2, BUN, CREATININE, GLUCOSE, CALCIUM in the last 72 hours. No results for input(s): LABPT, INR in the last 72 hours.  Neurologically intact No results found.  Assessment/Plan: 1 Day Post-Op Procedure(s) (LRB): RIGHT  TOTAL KNEE ARTHROPLASTY (Right) Up with therapy, WBAT. Observation status , home late today if mobile.   Desiree [name] 12/22/2019, 9:03 AM

## 2019-12-22 NOTE — Progress Notes (Signed)
Physical Therapy Treatment Patient Details Name: Desiree [name] MRN: [id number] DOB: 04-20-1946 Today's Date: 12/22/2019    History of Present Illness Pt 74 y.o. F admitted 12/21/2019 s/p R TKA secondary to knee osteoarthritis. PMHx includes CVA, breast cancer, DDD, Thoracic ascending aortic aneurysm.   PT Comments    Pt slowly progressing with mobility; remains limited by lethargy and symptomatic hypotension (see values below); BP returned to 125/68 by end of session, SpO2 92-95% on RA. Daughter present and supportive throughout session; increased time discussing and educating daughter on precautions, safety and d/c needs; family able to provide 24/7 assist. Recommend continued mobility progression with acute PT services prior to return home. Hopeful pt will progress well once hypotension and lethargy improved.  Orthostatic BPs Reclined in recliner 130/78  Sitting at edge of recliner 129/80s  Standing 118/99  Post-transfer 109/76  Sitting ~2 min 108/70s      Follow Up Recommendations  Follow surgeon's recommendation for DC plan and follow-up therapies;Home health PT;Supervision/Assistance - 24 hour     Equipment Recommendations  Rolling Biegler with 5" wheels;3in1 (PT)    Recommendations for Other Services       Precautions / Restrictions Precautions Precautions: Fall;Other (comment) Precaution Comments: Watch BP Restrictions Weight Bearing Restrictions: Yes RLE Weight Bearing: Weight bearing as tolerated    Mobility  Bed Mobility               General bed mobility comments: Seated in recliner  Transfers Overall transfer level: Needs assistance Equipment used: Rolling Maj (2 wheeled) Transfers: Sit to/from Stand Sit to Stand: Min guard         General transfer comment: Able to stand from recliner and BSC to RW with min guard for safety/balance; cues for correct hand placement; pt c/o lightheadedness with (+)  orthostatics  Ambulation/Gait Ambulation/Gait assistance: Min guard Gait Distance (Feet): 4 Feet Assistive device: Rolling Arntz (2 wheeled) Gait Pattern/deviations: Step-to pattern;Wide base of support;Antalgic;Decreased weight shift to right Gait velocity: Decreased   General Gait Details: Steps from recliner<>BSC with RW and close min guard; further mobility deferred secondary to lethargy and symptomatic hypotension   Stairs             Wheelchair Mobility    Modified Rankin (Stroke Patients Only)       Balance Overall balance assessment: Needs assistance Sitting-balance support: Bilateral upper extremity supported;Feet supported Sitting balance-Leahy Scale: Fair       Standing balance-Leahy Scale: Poor Standing balance comment: Reliant on UE support; able to perform pericare standing with single UE support, 1x LOB but pt able to correct with second UE support                            Cognition Arousal/Alertness: Lethargic;Suspect due to medications Behavior During Therapy: Flat affect Overall Cognitive Status: Within Functional Limits for tasks assessed                                 General Comments: WFL for simple tasks, although limited by feeling "groggy" and at times falling asleep during conversation; still following simple commands and answering questions appropriately      Exercises Other Exercises Other Exercises: RLE partial LAQ, prolonged sitting at edge of recliner for gravity-assisted R knee flexion; tolerated well    General Comments General comments (skin integrity, edema, etc.): Daughter present and supportive - verified that family able  to provide 24/7 assist      Pertinent Vitals/Pain Pain Assessment: Faces Faces Pain Scale: Hurts little more Pain Location: R knee Pain Descriptors / Indicators: Discomfort;Grimacing;Guarding Pain Intervention(s): Monitored during session;Repositioned    Home Living                       Prior Function            PT Goals (current goals can now be found in the care plan section) Progress towards PT goals: Progressing toward goals    Frequency    7X/week      PT Plan Frequency needs to be updated    Co-evaluation              AM-PAC PT "6 Clicks" Mobility   Outcome Measure  Help needed turning from your back to your side while in a flat bed without using bedrails?: None Help needed moving from lying on your back to sitting on the side of a flat bed without using bedrails?: A Little Help needed moving to and from a bed to a chair (including a wheelchair)?: A Little Help needed standing up from a chair using your arms (e.g., wheelchair or bedside chair)?: A Little Help needed to walk in hospital room?: A Little Help needed climbing 3-5 steps with a railing? : A Lot 6 Click Score: 18    End of Session Equipment Utilized During Treatment: Gait belt Activity Tolerance: Patient tolerated treatment well;Patient limited by lethargy Patient left: in chair;with call bell/phone within reach;with family/visitor present(with OT present) Nurse Communication: Mobility status PT Visit Diagnosis: Pain;Muscle weakness (generalized) (M62.81);Unsteadiness on feet (R26.81) Pain - Right/Left: Right Pain - part of body: Knee     Time: [id number] PT Time Calculation (min) (ACUTE ONLY): 24 min  Charges:  $Therapeutic Activity: 8-22 mins $Self Care/Home Management: 8-22                    Mabeline Caras, PT, DPT Acute Rehabilitation Services  Pager [phone] Office [place] 12/22/2019, 5:20 PM

## 2019-12-23 DIAGNOSIS — M1711 Unilateral primary osteoarthritis, right knee: Secondary | ICD-10-CM | POA: Diagnosis not present

## 2019-12-23 LAB — CBC
HCT: 28.5 % — ABNORMAL LOW (ref 36.0–46.0)
Hemoglobin: 9.2 g/dL — ABNORMAL LOW (ref 12.0–15.0)
MCH: 27.9 pg (ref 26.0–34.0)
MCHC: 32.3 g/dL (ref 30.0–36.0)
MCV: 86.4 fL (ref 80.0–100.0)
Platelets: 242 10*3/uL (ref 150–400)
RBC: 3.3 MIL/uL — ABNORMAL LOW (ref 3.87–5.11)
RDW: 14.5 % (ref 11.5–15.5)
WBC: 8.6 10*3/uL (ref 4.0–10.5)
nRBC: 0 % (ref 0.0–0.2)

## 2019-12-23 NOTE — Progress Notes (Signed)
Subjective: Patient states feeling much better today.  Pain controlled.  Has ambulated in hall.  Denies feeling lightheaded or dizzy.  Wants to go home.    Objective: Vital signs in last 24 hours: Temp:  [98.4 F (36.9 C)-98.7 F (37.1 C)] 98.4 F (36.9 C) (04/30 0858) Pulse Rate:  [68-87] 87 (04/30 0858) Resp:  [18] 18 (04/30 0858) BP: (119-142)/(63-74) 142/69 (04/30 0858) SpO2:  [94 %-98 %] 94 % (04/30 0858)  Intake/Output from previous day: 04/29 0701 - 04/30 0700 In: 1508.4 [P.O.:760; I.V.:748.4] Out: 1 [Urine:1] Intake/Output this shift: Total I/O In: 240 [P.O.:240] Out: -   Recent Labs    12/22/19 0158 12/23/19 0250  HGB 9.8* 9.2*   Recent Labs    12/22/19 0158 12/23/19 0250  WBC 7.5 8.6  RBC 3.44* 3.30*  HCT 30.5* 28.5*  PLT 263 242   No results for input(s): NA, K, CL, CO2, BUN, CREATININE, GLUCOSE, CALCIUM in the last 72 hours. No results for input(s): LABPT, INR in the last 72 hours.   Exam: Very pleasant female.  Alert and oriented.  NAD.  Knee wound looks good.  steris intact.  No drainage or signs of infection.  Calf nontender. NVI.    Assessment/Plan:  D/c home today if does well with PT this afternoon.      Benjiman Core 12/23/2019, 1:22 PM

## 2019-12-23 NOTE — TOC Initial Note (Addendum)
Transition of Care [facility]) - Initial/Assessment Note    Patient Details  Name: Desiree [name] MRN: [id number] Date of Birth: 02-13-1946  [facility] CM/SW Contact:    Sharin Mons, RN Phone Number: 12/23/2019, 12:24 PM  Clinical Narrative:   Presents s/p   Right total knee arthroplasty        From home alone.       [name] (Daughter)      [phone]     TOC following ...   Patient Goals and CMS Choice        Expected Discharge Plan and Services                           DME Arranged: 3-N-1, [place] [facility] DME Agency: [employer] Date DME Agency Contacted: 12/23/19 Time DME Agency Contacted: 12 Representative spoke with at DME Agency: [place]: PT, OT [place] Agency: [facility] [employer]) Date [place]: 12/23/19 Time [place]: 67 Representative spoke with at [place]: [place] Arrangements/Services       Do you feel safe going back to the place where you live?: Yes               Activities of Daily Living Home Assistive Devices/Equipment: Cane (specify quad or straight) ADL Screening (condition at time of admission) Patient's cognitive ability adequate to safely complete daily activities?: Yes Is the patient deaf or have difficulty hearing?: No Does the patient have difficulty seeing, even when wearing glasses/contacts?: No Does the patient have difficulty concentrating, remembering, or making decisions?: No Patient able to express need for assistance with ADLs?: Yes Does the patient have difficulty dressing or bathing?: No Independently performs ADLs?: Yes (appropriate for developmental age) Does the patient have difficulty walking or climbing stairs?: Yes Weakness of Legs: Right Weakness of Arms/Hands: None  Permission Sought/Granted Permission sought to share information with : Case Manager Permission granted to share information with : Yes, Verbal  Permission Granted              Emotional Assessment              Admission diagnosis:  S/P TKR (total knee replacement), right [Z96.651] Patient Active Problem List   Diagnosis Date Noted  . S/P TKR (total knee replacement), right 12/21/2019  . Pre-operative clearance 11/10/2019  . Acute cystitis with hematuria 11/10/2019  . Constipation 11/10/2019  . Unilateral primary osteoarthritis, right knee 10/30/2019  . Fatigue 10/18/2019  . Left shoulder pain 04/11/2019  . Ganglion cyst of volar aspect of left wrist 10/05/2018  . Fall at home, subsequent encounter 06/07/2018  . Knee pain, right 05/31/2017  . CKD (chronic kidney disease) stage 3, GFR 30-59 ml/min 10/01/2016  . Neck pain on left side 01/01/2016  . Annual physical exam 02/15/2015  . 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 Urinary incontinence 05/22/2014  . Metabolic syndrome X [id number]  . Osteopenia 12/12/2013  . Anemia 11/29/2013  . Obesity (BMI 30-39.9) 10/17/2012  . Thoracic aortic aneurysm ([place]) 10/12/2012  . Allergic rhinitis 03/02/2012  . BLADDER PROLAPSE 09/24/2010  . NEOPLASM, MALIGNANT, BREAST, RIGHT 02/04/2008  . Hyperlipidemia LDL goal <100 02/04/2008  . Essential hypertension 02/04/2008   PCP:  [name], MD Pharmacy:   [place] [place] [address] Phone: [phone] Fax: [place], [place] - [phone]  [id number] [place] [address] Phone: [phone] Fax: [phone]     Social Determinants of Health (SDOH) Interventions    Readmission Risk Interventions No flowsheet data found.

## 2019-12-23 NOTE — Progress Notes (Addendum)
Physical Therapy Treatment Patient Details Name: Desiree [name] MRN: [id number] DOB: 11-05-1945 Today's Date: 12/23/2019    History of Present Illness Pt 74 y.o. F admitted 12/21/2019 s/p R TKA secondary to knee osteoarthritis. PMHx includes CVA, breast cancer, DDD, Thoracic ascending aortic aneurysm.    PT Comments    Pt progressing well with no signs of dizziness.  Plan for stairs this pm BP in supine 115/72 BP in sitting 108/68    Follow Up Recommendations  Follow surgeon's recommendation for DC plan and follow-up therapies;Home health PT;Supervision/Assistance - 24 hour     Equipment Recommendations  Rolling [place] with 5" wheels;3in1 (PT)(youth height)    Recommendations for Other Services       Precautions / Restrictions Precautions Precautions: Fall;Other (comment) Precaution Comments: Watch BP Restrictions Weight Bearing Restrictions: Yes RLE Weight Bearing: Weight bearing as tolerated    Mobility  Bed Mobility Overal bed mobility: Needs Assistance Bed Mobility: Supine to Sit     Supine to sit: Supervision     General bed mobility comments: No assistance to move to edge of bed.  Transfers Overall transfer level: Needs assistance Equipment used: Rolling [place] (2 wheeled) Transfers: Sit to/from Stand Sit to Stand: Min guard Stand pivot transfers: Min guard       General transfer comment: Cues for hand placement from multiple surfaces.  Ambulation/Gait Ambulation/Gait assistance: Min guard Gait Distance (Feet): 120 Feet Assistive device: Rolling Wardell (2 wheeled) Gait Pattern/deviations: Antalgic;Decreased weight shift to right;Step-through pattern     General Gait Details: Decreased R knee flexion in swing phase.  Able to progress gt to step through pattern.   Stairs             Wheelchair Mobility    Modified Rankin (Stroke Patients Only)       Balance Overall balance assessment: Needs assistance Sitting-balance support: Feet  supported Sitting balance-Leahy Scale: Good     Standing balance support: Single extremity supported;During functional activity Standing balance-Leahy Scale: Poor Standing balance comment: requires UE support                             Cognition Arousal/Alertness: Awake/alert Behavior During Therapy: WFL for tasks assessed/performed Overall Cognitive Status: Within Functional Limits for tasks assessed                                        Exercises Total Joint Exercises Ankle Circles/Pumps: AROM;Both;10 reps;Supine Quad Sets: AROM;Right;10 reps;Supine Heel Slides: Right;10 reps;Supine;AAROM Hip ABduction/ADduction: AAROM;Right;10 reps;Supine Straight Leg Raises: AAROM;Right;10 reps;Supine;Limitations Straight Leg Raises Limitations: extensor lag Goniometric ROM: 70 degrees knee flexion    General Comments General comments (skin integrity, edema, etc.): discussed need to secure or remove area rugs, use of Schiro bag, and [place] safety to redue risk of falls       Pertinent Vitals/Pain Pain Assessment: 0-10 Pain Score: 7  Faces Pain Scale: Hurts a little bit Pain Location: R knee Pain Descriptors / Indicators: Discomfort;Grimacing;Guarding Pain Intervention(s): Repositioned;Monitored during session    Home Living                      Prior Function            PT Goals (current goals can now be found in the care plan section) Acute Rehab PT Goals Patient Stated Goal: To return home Potential  to Achieve Goals: Good Progress towards PT goals: Progressing toward goals    Frequency    7X/week      PT Plan Current plan remains appropriate    Co-evaluation              AM-PAC PT "6 Clicks" Mobility   Outcome Measure  Help needed turning from your back to your side while in a flat bed without using bedrails?: None Help needed moving from lying on your back to sitting on the side of a flat bed without using bedrails?:  A Little Help needed moving to and from a bed to a chair (including a wheelchair)?: A Little Help needed standing up from a chair using your arms (e.g., wheelchair or bedside chair)?: A Little Help needed to walk in hospital room?: A Little Help needed climbing 3-5 steps with a railing? : A Little 6 Click Score: 19    End of Session Equipment Utilized During Treatment: Gait belt Activity Tolerance: Patient tolerated treatment well;Patient limited by lethargy Patient left: (sitting at sink for bathing.) Nurse Communication: Mobility status PT Visit Diagnosis: Pain;Muscle weakness (generalized) (M62.81);Unsteadiness on feet (R26.81) Pain - Right/Left: Right Pain - part of body: Knee     Time: [id number] PT Time Calculation (min) (ACUTE ONLY): 29 min  Charges:  $Gait Training: 8-22 mins $Therapeutic Exercise: 8-22 mins                     Erasmo Leventhal , PTA Acute Rehabilitation Services Pager [phone] Office [phone]     Desiree [name] 12/23/2019, 4:48 PM

## 2019-12-23 NOTE — Plan of Care (Signed)

## 2019-12-23 NOTE — Plan of Care (Signed)
  Problem: Education: Goal: Knowledge of General Education information will improve Description: Including pain rating scale, medication(s)/side effects and non-pharmacologic comfort measures Outcome: Adequate for Discharge   

## 2019-12-23 NOTE — Progress Notes (Signed)
Pt AVS reviewed with RN, all questions answered to satisfaction. Pt DME delivered and iv removed. Pt dressed, belongings gathered, and pt taken down to family car.

## 2019-12-23 NOTE — Progress Notes (Signed)
Physical Therapy Treatment Patient Details Name: Desiree [name] MRN: [id number] DOB: 07-07-1946 Today's Date: 12/23/2019    History of Present Illness Pt 74 y.o. F admitted 12/21/2019 s/p R TKA secondary to knee osteoarthritis. PMHx includes CVA, breast cancer, DDD, Thoracic ascending aortic aneurysm.    PT Comments    PT returned for pm session.  Pt performed stair training with good tolerance.  Issued HEP and educated on frequency.  Informed nursing patient was ready to d/c home from a mobility stand point.     Follow Up Recommendations  Follow surgeon's recommendation for DC plan and follow-up therapies;Home health PT;Supervision/Assistance - 24 hour     Equipment Recommendations  Rolling Leiber with 5" wheels;3in1 (PT)(youth height)    Recommendations for Other Services       Precautions / Restrictions Precautions Precautions: Fall;Other (comment) Precaution Comments: Watch BP Restrictions Weight Bearing Restrictions: Yes RLE Weight Bearing: Weight bearing as tolerated    Mobility  Bed Mobility Overal bed mobility: Needs Assistance Bed Mobility: Supine to Sit     Supine to sit: Supervision     General bed mobility comments: Seated in recliner.  Transfers Overall transfer level: Needs assistance Equipment used: Rolling Testerman (2 wheeled) Transfers: Sit to/from Stand Sit to Stand: Supervision Stand pivot transfers: Min guard       General transfer comment: Cues for hand placement from multiple surfaces.  Ambulation/Gait Ambulation/Gait assistance: Min guard Gait Distance (Feet): 150 Feet Assistive device: Rolling Rowe (2 wheeled) Gait Pattern/deviations: Antalgic;Decreased weight shift to right;Step-through pattern Gait velocity: Decreased   General Gait Details: Decreased R knee flexion in swing phase.  Able to progress gt to step through pattern.   Stairs Stairs: Yes Stairs assistance: Min guard Stair Management: Two rails Number of Stairs:  2 General stair comments: Cues for sequencing and hand placement on railings.   Wheelchair Mobility    Modified Rankin (Stroke Patients Only)       Balance Overall balance assessment: Needs assistance Sitting-balance support: Feet supported Sitting balance-Leahy Scale: Good     Standing balance support: Single extremity supported;During functional activity Standing balance-Leahy Scale: Poor Standing balance comment: requires UE support                             Cognition Arousal/Alertness: Awake/alert Behavior During Therapy: WFL for tasks assessed/performed Overall Cognitive Status: Within Functional Limits for tasks assessed                                        Exercises Total Joint Exercises Ankle Circles/Pumps: AROM;Both;10 reps;Supine Quad Sets: AROM;Right;10 reps;Supine Heel Slides: Right;10 reps;Supine;AAROM Hip ABduction/ADduction: AAROM;Right;10 reps;Supine Straight Leg Raises: AAROM;Right;10 reps;Supine;Limitations Straight Leg Raises Limitations: extensor lag Goniometric ROM: 70 degrees knee flexion    General Comments General comments (skin integrity, edema, etc.): discussed need to secure or remove area rugs, use of [place] bag, and [place] safety to redue risk of falls       Pertinent Vitals/Pain Pain Assessment: 0-10 Pain Score: 7  Faces Pain Scale: Hurts a little bit Pain Location: R knee Pain Descriptors / Indicators: Discomfort;Grimacing;Guarding Pain Intervention(s): Repositioned;Monitored during session    Home Living                      Prior Function            PT  Goals (current goals can now be found in the care plan section) Acute Rehab PT Goals Patient Stated Goal: To return home Potential to Achieve Goals: Good Progress towards PT goals: Progressing toward goals    Frequency    7X/week      PT Plan Current plan remains appropriate    Co-evaluation              AM-PAC PT  "6 Clicks" Mobility   Outcome Measure  Help needed turning from your back to your side while in a flat bed without using bedrails?: None Help needed moving from lying on your back to sitting on the side of a flat bed without using bedrails?: A Little Help needed moving to and from a bed to a chair (including a wheelchair)?: A Little Help needed standing up from a chair using your arms (e.g., wheelchair or bedside chair)?: A Little Help needed to walk in hospital room?: A Little Help needed climbing 3-5 steps with a railing? : A Little 6 Click Score: 19    End of Session Equipment Utilized During Treatment: Gait belt Activity Tolerance: Patient tolerated treatment well;Patient limited by lethargy Patient left: (sitting at sink for bathing.) Nurse Communication: Mobility status PT Visit Diagnosis: Pain;Muscle weakness (generalized) (M62.81);Unsteadiness on feet (R26.81) Pain - Right/Left: Right Pain - part of body: Knee     Time: [id number] PT Time Calculation (min) (ACUTE ONLY): 28 min  Charges:  $Gait Training: 8-22 mins $Therapeutic Exercise: 8-22 mins                     Erasmo Leventhal , PTA Acute Rehabilitation Services Pager [phone] Office [phone]     Latricia Cerrito Eli Hose 12/23/2019, 4:52 PM

## 2019-12-23 NOTE — TOC Transition Note (Signed)
Transition of Care [facility]) - CM/SW Discharge Note   Patient Details  Name: AMANDA-LEE GUNZENHAUSER MRN: [id number] Date of Birth: Jan 27, 1946  Transition of Care [facility]) CM/SW Contact:  Sharin Mons, RN Phone Number: [phone] 12/23/2019, 4:56 PM    Clinical Narrative:  Admitted 12/21/2019 s/p R TKA secondary to knee osteoarthritis. PMHx includes CVA, breast cancer, DDD, Thoracic ascending aortic aneurysm.  Pt will transition to home with daughter Suanne Marker today. Suanne Marker states mom will have 24/7 supervision assistance once d/c. Referral made wih [facility] after choice given and selected by pt for [facility] services. [place] accepted. DME already delivered to bedside. Pt without Rx med concerns.  Daughter to provide transportation to home.    Final next level of care: Home w Home Health Services Barriers to Discharge: No Barriers Identified   Patient Goals and CMS Choice     Choice offered to / list presented to : Patient  Discharge Placement   Discharge Plan and Services                DME Arranged: 3-N-1, [place] youth DME Agency: [employer] Date DME Agency Contacted: 12/23/19 Time DME Agency Contacted: 14 Representative spoke with at DME Agency: [place]: PT, OT [place] Agency: [facility] (formerly [employer]) Date [place]: 12/23/19 Time [place]: 18 Representative spoke with at [place]: [place] ([place]) Interventions     Readmission Risk Interventions No flowsheet data found.

## 2019-12-23 NOTE — Progress Notes (Signed)
Occupational Therapy Treatment Patient Details Name: Desiree [name] MRN: [id number] DOB: May 30, 1946 Today's Date: 12/23/2019    History of present illness Pt 74 y.o. F admitted 12/21/2019 s/p R TKA secondary to knee osteoarthritis. PMHx includes CVA, breast cancer, DDD, Thoracic ascending aortic aneurysm.   OT comments  Pt is making excellent progress toward goals.  She is able to perform ADLs with min guard - min A and functional mobility with min guard assist.  Daughter present and very supportive.  Education completed with them to reduce risk of falls.  Family will provide 24 hour assist   Follow Up Recommendations  Home health OT;Supervision/Assistance - 24 hour    Equipment Recommendations  3 in 1 bedside commode    Recommendations for Other Services      Precautions / Restrictions Precautions Precautions: Fall;Other (comment) Restrictions Weight Bearing Restrictions: Yes RLE Weight Bearing: Weight bearing as tolerated       Mobility Bed Mobility               General bed mobility comments: Pt sitting up at sink bathing   Transfers Overall transfer level: Needs assistance Equipment used: Rolling [place] (2 wheeled) Transfers: Sit to/from [employer] Transfers Sit to Stand: Min guard Stand pivot transfers: Min guard       General transfer comment: min guard assist for safety     Balance Overall balance assessment: Needs assistance Sitting-balance support: Feet supported Sitting balance-Leahy Scale: Good     Standing balance support: Single extremity supported;During functional activity Standing balance-Leahy Scale: Poor Standing balance comment: requires UE support                            ADL either performed or assessed with clinical judgement   ADL Overall ADL's : Needs assistance/impaired Eating/Feeding: Set up;Sitting   Grooming: Set up;Sitting   Upper Body Bathing: Set up;Sitting   Lower Body Bathing: Min guard;Sit  to/from stand   Upper Body Dressing : Set up;Sitting   Lower Body Dressing: Minimal assistance;Sit to/from stand Lower Body Dressing Details (indicate cue type and reason): requires assist for Rt sock and shoes.  She was able to don underpants with min guard assist  Toilet Transfer: Min guard;Ambulation;Comfort height toilet;RW;Grab bars   Toileting- Clothing Manipulation and Hygiene: Min guard;Sit to/from stand     Tub/Shower Transfer Details (indicate cue type and reason): daughter reports they are borrowing a tub transfer bench from a family member.  They are able to verbalize understanding of how to perform the transfer safely  Functional mobility during ADLs: Min guard;Rolling Kluge General ADL Comments: daughter present and assisting pt with bathing      Vision       Perception     Praxis      Cognition Arousal/Alertness: Awake/alert Behavior During Therapy: WFL for tasks assessed/performed Overall Cognitive Status: Within Functional Limits for tasks assessed                                          Exercises     Shoulder Instructions       General Comments discussed need to secure or remove area rugs, use of [place] bag, and [place] safety to redue risk of falls     Pertinent Vitals/ Pain       Pain Assessment: Faces Faces Pain Scale: Hurts a little  bit Pain Location: R knee Pain Descriptors / Indicators: Discomfort;Grimacing;Guarding Pain Intervention(s): Monitored during session  Home Living                                          Prior Functioning/Environment              Frequency  Min 3X/week        Progress Toward Goals  OT Goals(current goals can now be found in the care plan section)  Progress towards OT goals: Progressing toward goals     Plan Discharge plan remains appropriate    Co-evaluation                 AM-PAC OT "6 Clicks" Daily Activity     Outcome Measure   Help from another  person eating meals?: None Help from another person taking care of personal grooming?: A Little Help from another person toileting, which includes using toliet, bedpan, or urinal?: A Little Help from another person bathing (including washing, rinsing, drying)?: A Little Help from another person to put on and taking off regular upper body clothing?: A Little Help from another person to put on and taking off regular lower body clothing?: A Little 6 Click Score: 19    End of Session Equipment Utilized During Treatment: Rolling [place];Gait belt  OT Visit Diagnosis: Unsteadiness on feet (R26.81);Other abnormalities of gait and mobility (R26.89);Muscle weakness (generalized) (M62.81);Pain Pain - Right/Left: Right Pain - part of body: Knee   Activity Tolerance Patient tolerated treatment well   Patient Left in chair;with call bell/phone within reach;with family/visitor present   Nurse Communication Mobility status        Time: 1203-1221 OT Time Calculation (min): 18 min  Charges: OT General Charges $OT Visit: 1 Visit OT Treatments $Self Care/Home Management : 8-22 mins  Nilsa Nutting OTR/L Acute Rehabilitation Services Pager [phone] Office [phone]    Lucille Passy M 12/23/2019, 2:10 PM

## 2019-12-24 ENCOUNTER — Encounter: Payer: Self-pay | Admitting: Orthopaedic Surgery

## 2019-12-24 ENCOUNTER — Encounter: Payer: Self-pay | Admitting: Anesthesiology

## 2019-12-24 ENCOUNTER — Other Ambulatory Visit: Payer: Self-pay | Admitting: Orthopaedic Surgery

## 2019-12-24 DIAGNOSIS — K5900 Constipation, unspecified: Secondary | ICD-10-CM | POA: Diagnosis not present

## 2019-12-24 DIAGNOSIS — E785 Hyperlipidemia, unspecified: Secondary | ICD-10-CM | POA: Diagnosis not present

## 2019-12-24 DIAGNOSIS — Z8673 Personal history of transient ischemic attack (TIA), and cerebral infarction without residual deficits: Secondary | ICD-10-CM | POA: Diagnosis not present

## 2019-12-24 DIAGNOSIS — J309 Allergic rhinitis, unspecified: Secondary | ICD-10-CM | POA: Diagnosis not present

## 2019-12-24 DIAGNOSIS — Z6833 Body mass index (BMI) 33.0-33.9, adult: Secondary | ICD-10-CM | POA: Diagnosis not present

## 2019-12-24 DIAGNOSIS — M5030 Other cervical disc degeneration, unspecified cervical region: Secondary | ICD-10-CM | POA: Diagnosis not present

## 2019-12-24 DIAGNOSIS — Z9011 Acquired absence of right breast and nipple: Secondary | ICD-10-CM | POA: Diagnosis not present

## 2019-12-24 DIAGNOSIS — N183 Chronic kidney disease, stage 3 unspecified: Secondary | ICD-10-CM | POA: Diagnosis not present

## 2019-12-24 DIAGNOSIS — K219 Gastro-esophageal reflux disease without esophagitis: Secondary | ICD-10-CM | POA: Diagnosis not present

## 2019-12-24 DIAGNOSIS — E669 Obesity, unspecified: Secondary | ICD-10-CM | POA: Diagnosis not present

## 2019-12-24 DIAGNOSIS — M8580 Other specified disorders of bone density and structure, unspecified site: Secondary | ICD-10-CM | POA: Diagnosis not present

## 2019-12-24 DIAGNOSIS — Z9181 History of falling: Secondary | ICD-10-CM | POA: Diagnosis not present

## 2019-12-24 DIAGNOSIS — Z96651 Presence of right artificial knee joint: Secondary | ICD-10-CM | POA: Diagnosis not present

## 2019-12-24 DIAGNOSIS — Z853 Personal history of malignant neoplasm of breast: Secondary | ICD-10-CM | POA: Diagnosis not present

## 2019-12-24 DIAGNOSIS — Z471 Aftercare following joint replacement surgery: Secondary | ICD-10-CM | POA: Diagnosis not present

## 2019-12-24 DIAGNOSIS — D631 Anemia in chronic kidney disease: Secondary | ICD-10-CM | POA: Diagnosis not present

## 2019-12-24 DIAGNOSIS — I712 Thoracic aortic aneurysm, without rupture: Secondary | ICD-10-CM | POA: Diagnosis not present

## 2019-12-24 DIAGNOSIS — I129 Hypertensive chronic kidney disease with stage 1 through stage 4 chronic kidney disease, or unspecified chronic kidney disease: Secondary | ICD-10-CM | POA: Diagnosis not present

## 2019-12-24 MED ORDER — HYDROCODONE-ACETAMINOPHEN 7.5-325 MG PO TABS: 1.0000 | ORAL_TABLET | Freq: Four times a day (QID) | ORAL | 0 refills | Status: DC | PRN

## 2019-12-24 NOTE — Anesthesia Postprocedure Evaluation (Signed)
Anesthesia Post Note  Patient: Desiree [name]  Procedure(s) Performed: RIGHT  TOTAL KNEE ARTHROPLASTY (Right Knee)     Patient location during evaluation: PACU Anesthesia Type: Regional, MAC and Spinal Level of consciousness: awake and alert Pain management: pain level controlled Vital Signs Assessment: post-procedure vital signs reviewed and stable Respiratory status: spontaneous breathing, nonlabored ventilation, respiratory function stable and patient connected to nasal cannula oxygen Cardiovascular status: stable and blood pressure returned to baseline Postop Assessment: no apparent nausea or vomiting and spinal receding Anesthetic complications: no    Last Vitals:  Vitals:   12/23/19 0858 12/23/19 1420  BP: (!) 142/69 115/65  Pulse: 87 86  Resp: 18 18  Temp: 36.9 C 36.9 C  SpO2: 94% 99%    Last Pain:  Vitals:   12/23/19 1453  TempSrc:   PainSc: 5                  Rubie Ficco

## 2019-12-24 NOTE — Progress Notes (Signed)
[facility] did not have Rx in stock. Daughter called and requested pain med sent to [facility].

## 2019-12-26 ENCOUNTER — Telehealth: Payer: Self-pay | Admitting: Orthopaedic Surgery

## 2019-12-26 NOTE — Telephone Encounter (Signed)
Desiree [name] with [facility] called. She would like verbal orders for PT 1x 1wk and 3x 2wks. Her call back number is [phone]

## 2019-12-26 NOTE — Telephone Encounter (Signed)
Yes OK. thanks

## 2019-12-26 NOTE — Telephone Encounter (Signed)
I called Desiree [name] and advised. 

## 2019-12-26 NOTE — Telephone Encounter (Signed)
Ok for orders? 

## 2019-12-29 DIAGNOSIS — Z6833 Body mass index (BMI) 33.0-33.9, adult: Secondary | ICD-10-CM | POA: Diagnosis not present

## 2019-12-29 DIAGNOSIS — I129 Hypertensive chronic kidney disease with stage 1 through stage 4 chronic kidney disease, or unspecified chronic kidney disease: Secondary | ICD-10-CM | POA: Diagnosis not present

## 2019-12-29 DIAGNOSIS — E785 Hyperlipidemia, unspecified: Secondary | ICD-10-CM | POA: Diagnosis not present

## 2019-12-29 DIAGNOSIS — D631 Anemia in chronic kidney disease: Secondary | ICD-10-CM | POA: Diagnosis not present

## 2019-12-29 DIAGNOSIS — Z471 Aftercare following joint replacement surgery: Secondary | ICD-10-CM | POA: Diagnosis not present

## 2019-12-29 DIAGNOSIS — Z8673 Personal history of transient ischemic attack (TIA), and cerebral infarction without residual deficits: Secondary | ICD-10-CM | POA: Diagnosis not present

## 2019-12-29 DIAGNOSIS — Z96651 Presence of right artificial knee joint: Secondary | ICD-10-CM | POA: Diagnosis not present

## 2019-12-29 DIAGNOSIS — Z9011 Acquired absence of right breast and nipple: Secondary | ICD-10-CM | POA: Diagnosis not present

## 2019-12-29 DIAGNOSIS — E669 Obesity, unspecified: Secondary | ICD-10-CM | POA: Diagnosis not present

## 2019-12-29 DIAGNOSIS — J309 Allergic rhinitis, unspecified: Secondary | ICD-10-CM | POA: Diagnosis not present

## 2019-12-29 DIAGNOSIS — M8580 Other specified disorders of bone density and structure, unspecified site: Secondary | ICD-10-CM | POA: Diagnosis not present

## 2019-12-29 DIAGNOSIS — K5900 Constipation, unspecified: Secondary | ICD-10-CM | POA: Diagnosis not present

## 2019-12-29 DIAGNOSIS — K219 Gastro-esophageal reflux disease without esophagitis: Secondary | ICD-10-CM | POA: Diagnosis not present

## 2019-12-29 DIAGNOSIS — Z853 Personal history of malignant neoplasm of breast: Secondary | ICD-10-CM | POA: Diagnosis not present

## 2019-12-29 DIAGNOSIS — Z9181 History of falling: Secondary | ICD-10-CM | POA: Diagnosis not present

## 2019-12-29 DIAGNOSIS — I712 Thoracic aortic aneurysm, without rupture: Secondary | ICD-10-CM | POA: Diagnosis not present

## 2019-12-29 DIAGNOSIS — M5030 Other cervical disc degeneration, unspecified cervical region: Secondary | ICD-10-CM | POA: Diagnosis not present

## 2019-12-29 DIAGNOSIS — N183 Chronic kidney disease, stage 3 unspecified: Secondary | ICD-10-CM | POA: Diagnosis not present

## 2019-12-30 ENCOUNTER — Telehealth: Payer: Self-pay | Admitting: *Deleted

## 2019-12-30 ENCOUNTER — Other Ambulatory Visit: Payer: Self-pay | Admitting: *Deleted

## 2019-12-30 DIAGNOSIS — Z96651 Presence of right artificial knee joint: Secondary | ICD-10-CM

## 2019-12-30 DIAGNOSIS — M1711 Unilateral primary osteoarthritis, right knee: Secondary | ICD-10-CM

## 2019-12-30 NOTE — Telephone Encounter (Signed)
Spoke with patient today and updated that home health PT will be finished on Wednesday, 01/04/20 and she sees Dr. [name] in [place] on 01/05/20. Order for OPPT made with [facility] in [place]. Waiting for call back to schedule after her 2 week f/u. Haven't heard back from them and will call her when speak with staff regarding scheduling.

## 2020-01-03 ENCOUNTER — Telehealth: Payer: Self-pay | Admitting: *Deleted

## 2020-01-03 NOTE — Telephone Encounter (Signed)
Patient scheduled to begin OPPT and scheduled by staff at [facility] for start of care on Wednesday, 01/11/20. Called and requested a sooner date as her HHPT will be finished on Wednesday 01/04/20. Appointment now scheduled for start of care OPPT on Monday, 01/09/20 at 11:15 am. Patient made aware. HHPT also aware of Outpatient start of care.

## 2020-01-03 NOTE — Discharge Summary (Signed)
Patient ID: Desiree [name] MRN: [id number] DOB/AGE: 05-10-1946 74 y.o.  Admit date: 12/21/2019 Discharge date: 12/23/2019 Admission Diagnoses:  Active Problems:   S/P TKR (total knee replacement), right   Discharge Diagnoses:  Active Problems:   S/P TKR (total knee replacement), right  status post Procedure(s): RIGHT  TOTAL KNEE ARTHROPLASTY  Past Medical History:  Diagnosis Date  . Allergic rhinitis   . Arthritis HIPS/ WRISTS  . Cancer ([place])   . DDD (degenerative disc disease), cervical   . GERD (gastroesophageal reflux disease) OCCASIONAL  . Heart murmur    turbulent flow through LVOT, no obstruction 08/26/18 echo  . History of Bell's palsy 2011  RIGHT SIDE -- RESOLVED  . 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 History of CVA (cerebrovascular accident) PER SCAN IN 2011  . Hyperlipidemia   . Hypertension   . Impaired fasting glucose PER PCP  DR SIMPSON   WATCH DIET  . Mixed urge and stress incontinence   . Nocturia   . Sinus drainage   . Thoracic ascending aortic aneurysm ([place]) LAST CHEST CT 09-02-2010--  FOLLOWED BY  CARDIOLOGIST--  DR [id number]  . Vaginal wall prolapse     Surgeries: Procedure(s): RIGHT  TOTAL KNEE ARTHROPLASTY on 12/21/2019   Consultants:   Discharged Condition: Improved  Hospital Course: Desiree [name] is an 74 y.o. female who was admitted 12/21/2019 for operative treatment of right knee arthritis. Patient failed conservative treatments (please see the history and physical for the specifics) and had severe unremitting pain that affects sleep, daily activities and work/hobbies. After pre-op clearance, the patient was taken to the operating room on 12/21/2019 and underwent  Procedure(s): RIGHT  TOTAL KNEE ARTHROPLASTY.    Patient was given perioperative antibiotics:  Anti-infectives (From admission, onward)   Start     Dose/Rate Route Frequency Ordered Stop   12/21/19 1033   vancomycin (VANCOCIN) IVPB 1000 mg/200 mL premix     1,000 mg 200 mL/hr over 60 Minutes Intravenous 30 min pre-op 12/21/19 1033 12/21/19 1739       Patient was given sequential compression devices and early ambulation to prevent DVT.   Patient benefited maximally from hospital stay and there were no complications. At the time of discharge, the patient was urinating/moving their bowels without difficulty, tolerating a regular diet, pain is controlled with oral pain medications and they have been cleared by PT/OT.   Recent vital signs: No data found.   Recent laboratory studies: No results for input(s): WBC, HGB, HCT, PLT, NA, K, CL, CO2, BUN, CREATININE, GLUCOSE, INR, CALCIUM in the last 72 hours.  Invalid input(s): PT, 2   Discharge Medications:   Allergies as of 12/23/2019      Reactions   Codeine Other (See Comments)   CONFUSION/ DIZZY   Quinolones Other (See Comments)   unknown      Medication List    STOP taking these medications   acetaminophen 650 MG CR tablet Commonly known as: TYLENOL     TAKE these medications   aspirin EC 325 MG tablet Take 1 tablet (325 mg total) by mouth daily.   betamethasone dipropionate 0.05 % cream Apply sparingly two times daily for 10 days, then as needed, to affected areas( hairline, neck)   carvedilol 3.125 MG tablet Commonly known as: COREG TAKE 1 TABLET(3.125 MG) BY MOUTH TWICE DAILY WITH A MEAL What changed:   how much to take  how to take this  when to take this  additional instructions   Cetirizine HCl 10 MG Tbdp Take 1 tablet by mouth daily. What changed: how much to take   docusate sodium 100 MG capsule Commonly known as: COLACE Take 100 mg by mouth daily as needed for mild constipation.   gabapentin 100 MG capsule Commonly known as: NEURONTIN TAKE TWO CAPSULES BY MOUTH ONCE DAILY AT BEDTIME What changed:   how much to take  how to take this  when to take this  additional instructions   guaifenesin  400 MG Tabs tablet Commonly known as: HUMIBID E Take 400 mg by mouth daily as needed (Mucus build up). Mucus relief   hydrochlorothiazide 25 MG tablet Commonly known as: HYDRODIURIL TAKE 1 TABLET BY MOUTH ONCE DAILY IN THE MORNING   ibuprofen 800 MG tablet Commonly known as: ADVIL Take 1 tablet (800 mg total) by mouth every 8 (eight) hours as needed. What changed: reasons to take this   losartan 50 MG tablet Commonly known as: COZAAR Take 1/2 (one-half) tablet by mouth once daily What changed: See the new instructions.   lovastatin 40 MG tablet Commonly known as: MEVACOR Take 1 tablet by mouth once daily with breakfast What changed: See the new instructions.   methocarbamol 500 MG tablet Commonly known as: Robaxin Take 1 tablet (500 mg total) by mouth every 8 (eight) hours as needed for muscle spasms.   montelukast 10 MG tablet Commonly known as: SINGULAIR TAKE 1 TABLET BY MOUTH AT BEDTIME   multivitamin tablet Take 1 tablet by mouth daily. One a day woman's   olopatadine 0.1 % ophthalmic solution Commonly known as: PATANOL Place 1 drop into both eyes daily.   Oscal 500/200 D-3 500-200 MG-UNIT tablet Generic drug: calcium-vitamin D Take 1 tablet by mouth daily with breakfast.   potassium chloride 10 MEQ tablet Commonly known as: KLOR-CON Take 1 tablet (10 mEq total) by mouth daily.   Slow Fe 142 (45 Fe) MG Tbcr Generic drug: Ferrous Sulfate Take 142 mg by mouth daily. What changed: when to take this   solifenacin 10 MG tablet Commonly known as: VESICARE Take 10 mg by mouth daily.   SOOTHE XP OP Place 2 drops into both ears daily as needed (Dry eye).   vitamin C 1000 MG tablet Take 1,000 mg by mouth daily.   Vitamin D 50 MCG (2000 UT) tablet Take 2,000 Units by mouth daily.   zinc gluconate 50 MG tablet Take 50 mg by mouth daily.       Diagnostic Studies: DG Chest 2 View  Result Date: 12/14/2019 CLINICAL DATA:  Preop for knee surgery.  History of  hypertension. EXAM: CHEST - 2 VIEW COMPARISON:  11/10/2019 FINDINGS: Cardiac silhouette is normal in size and configuration. No mediastinal or hilar masses. No evidence of adenopathy. Clear lungs.  No pleural effusion or pneumothorax. Eventrated anterior right hemidiaphragm, stable. Skeletal structures are intact. IMPRESSION: No active cardiopulmonary disease. Electronically Signed   By: Lajean Manes M.D.   On: 12/14/2019 17:00      Follow-up Information    [name], MD Follow up in 2 week(s).   Specialty: Orthopedic Surgery Contact information: [place] [address] [phone]        [facility] Follow up.   Specialty: [place] Why: home health services arranged Contact information: [place] [address] [phone]  Discharge Plan:  discharge to home  Disposition:     Signed: Benjiman Core  01/03/2020, 3:49 PM

## 2020-01-05 ENCOUNTER — Other Ambulatory Visit: Payer: Self-pay

## 2020-01-05 ENCOUNTER — Ambulatory Visit: Payer: Self-pay

## 2020-01-05 ENCOUNTER — Ambulatory Visit (INDEPENDENT_AMBULATORY_CARE_PROVIDER_SITE_OTHER): Payer: PPO | Admitting: Orthopaedic Surgery

## 2020-01-05 VITALS — BP 140/83 | HR 77

## 2020-01-05 DIAGNOSIS — Z96651 Presence of right artificial knee joint: Secondary | ICD-10-CM | POA: Diagnosis not present

## 2020-01-05 NOTE — Progress Notes (Signed)
Post-Op Visit Note   Patient: Desiree [name]           Date of Birth: 1946-05-24           MRN: [id number] Visit Date: 01/05/2020 PCP: [name], MD   Assessment & Plan: Post total knee arthroplasty right knee.  She has good flexion she needs to work on getting the last 5 to 10 degrees of extension.  Transition to outpatient therapy in [place].  Recheck 4 weeks.  Chief Complaint:  Chief Complaint  Patient presents with  . Right Knee - Routine Post Op   Visit Diagnoses:  1. Status post total right knee replacement     Plan: Continue therapy transitioning from home health physical therapy to outpatient therapy.  They will call if they need a refill of the Norco 7.5/325.  Follow-Up Instructions: Return in about 4 weeks (around 02/02/2020).   Orders:  Orders Placed This Encounter  Procedures  . XR Knee 1-2 Views Right   No orders of the defined types were placed in this encounter.   Imaging: XR Knee 1-2 Views Right  Result Date: 01/05/2020 AP lateral right knee obtained and reviewed.  This shows well-positioned total knee arthroplasty without loosening or subsidence. Impression: Satisfactory right total knee arthroplasty.   PMFS History: Patient Active Problem List   Diagnosis Date Noted  . S/P TKR (total knee replacement), right 12/21/2019  . Pre-operative clearance 11/10/2019  . Acute cystitis with hematuria 11/10/2019  . Constipation 11/10/2019  . Unilateral primary osteoarthritis, right knee 10/30/2019  . Fatigue 10/18/2019  . Left shoulder pain 04/11/2019  . Ganglion cyst of volar aspect of left wrist 10/05/2018  . Fall at home, subsequent encounter 06/07/2018  . Knee pain, right 05/31/2017  . CKD (chronic kidney disease) stage 3, GFR 30-59 ml/min 10/01/2016  . Neck pain on left side 01/01/2016  . Annual physical exam 02/15/2015  . 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 Urinary incontinence 05/22/2014  . Metabolic syndrome X  [id number]  . Osteopenia 12/12/2013  . Anemia 11/29/2013  . Obesity (BMI 30-39.9) 10/17/2012  . Thoracic aortic aneurysm ([place]) 10/12/2012  . Allergic rhinitis 03/02/2012  . BLADDER PROLAPSE 09/24/2010  . NEOPLASM, MALIGNANT, BREAST, RIGHT 02/04/2008  . Hyperlipidemia LDL goal <100 02/04/2008  . Essential hypertension 02/04/2008   Past Medical History:  Diagnosis Date  . Allergic rhinitis   . Arthritis HIPS/ WRISTS  . Cancer ([place])   . DDD (degenerative disc disease), cervical   . GERD (gastroesophageal reflux disease) OCCASIONAL  . Heart murmur    turbulent flow through LVOT, no obstruction 08/26/18 echo  . History of Bell's palsy 2011  RIGHT SIDE -- RESOLVED  . 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 History of CVA (cerebrovascular accident) PER SCAN IN 2011  . Hyperlipidemia   . Hypertension   . Impaired fasting glucose PER PCP  DR SIMPSON   WATCH DIET  . Mixed urge and stress incontinence   . Nocturia   . Sinus drainage   . Thoracic ascending aortic aneurysm ([place]) LAST CHEST CT 09-02-2010--  FOLLOWED BY  CARDIOLOGIST--  DR [id number]  . Vaginal wall prolapse     Family History  Problem Relation Age of Onset  . Kidney failure Mother   . Hypertension Mother   . Heart attack Mother 39  . Throat cancer Father   . Prostate cancer Father   .  Lung cancer Father 60  . Breast cancer Sister   . Hypertension Brother        2 stents  . Colon cancer Neg Hx   . Colon polyps Neg Hx     Past Surgical History:  Procedure Laterality Date  . ABDOMINAL HYSTERECTOMY    . BILATERAL CARPAL TUNNEL RELEASE  1980'S  . CARDIAC CATHETERIZATION  02-01-2009  DR EICHHORN   NORMAL CORONARY ARTERIES/ NORMAL LV SIZE AND FUNCTION/ AORTIC ROOT SIZE AT THE UPPER LIMIT OF NORMAL   . CARPAL TUNNEL RELEASE    . CERVICAL DISC SURGERY  12-12-2005  DR Vertell Limber   LEFT  C6 - C7 HERINATED / DDD/ SPONDYLOSIS  . COLONOSCOPY N/A 02/10/2014    Procedure: COLONOSCOPY;  Surgeon: Danie Binder, MD;  [facility];  Service: Endoscopy;  Laterality: N/A;  8:30  . CYSTOCELE REPAIR  08/02/2012   Procedure: ANTERIOR REPAIR (CYSTOCELE);  Surgeon: Ailene Rud, MD;  [facility];  Service: Urology;  Laterality: N/A;  [employer] Uphold Anterior Pelvic Floor Sacrospinus Repair. Anterior wall of the vagina.  . ESOPHAGOGASTRODUODENOSCOPY N/A 02/10/2014   Procedure: ESOPHAGOGASTRODUODENOSCOPY (EGD);  Surgeon: Danie Binder, MD;  [facility];  Service: Endoscopy;  Laterality: N/A;  . GIVENS CAPSULE STUDY N/A 03/13/2014   Procedure: GIVENS CAPSULE STUDY;  Surgeon: Danie Binder, MD;  [facility];  Service: Endoscopy;  Laterality: N/A;  7:30  . NECK SURGERY  2007  . RIGHT PARTIAL MASTECTOMY  11-11-2004  DR [name] YOUNG   DUCTAL CARCINOMA IN SITU RIGHT BREAST  . TOTAL ABDOMINAL HYSTERECTOMY W/ BILATERAL SALPINGOOPHORECTOMY  1990  . TOTAL KNEE ARTHROPLASTY Right 12/21/2019   Procedure: RIGHT  TOTAL KNEE ARTHROPLASTY;  Surgeon: [name], MD;  Location: [facility];  Service: Orthopedics;  Laterality: Right;  . TRANSTHORACIC ECHOCARDIOGRAM  03-30-2008  DR MARGARET SIMPSON   LV SIZE AND FUNCTION NORMAL/ MODERATE AORTIC ARCH DILATATION/ MILD MR   Social History   Occupational History  . Not on file  Tobacco Use  . Smoking status: Never Smoker  . Smokeless tobacco: Never Used  Substance and Sexual Activity  . Alcohol use: No  . Drug use: No  . Sexual activity: Not Currently

## 2020-01-06 ENCOUNTER — Other Ambulatory Visit: Payer: Self-pay | Admitting: Orthopaedic Surgery

## 2020-01-06 NOTE — Telephone Encounter (Signed)
OK refill?? 

## 2020-01-09 ENCOUNTER — Other Ambulatory Visit: Payer: Self-pay

## 2020-01-09 ENCOUNTER — Encounter (HOSPITAL_COMMUNITY): Payer: Self-pay | Admitting: Physical Therapy

## 2020-01-09 ENCOUNTER — Ambulatory Visit (HOSPITAL_COMMUNITY): Payer: PPO | Attending: Orthopaedic Surgery | Admitting: Physical Therapy

## 2020-01-09 DIAGNOSIS — R2689 Other abnormalities of gait and mobility: Secondary | ICD-10-CM | POA: Diagnosis not present

## 2020-01-09 DIAGNOSIS — M25661 Stiffness of right knee, not elsewhere classified: Secondary | ICD-10-CM | POA: Diagnosis not present

## 2020-01-09 DIAGNOSIS — M25561 Pain in right knee: Secondary | ICD-10-CM | POA: Diagnosis not present

## 2020-01-09 DIAGNOSIS — M6281 Muscle weakness (generalized): Secondary | ICD-10-CM | POA: Diagnosis not present

## 2020-01-09 NOTE — Therapy (Signed)
[place], [place], [address] Phone: [phone]   Fax:  [phone]  Physical Therapy Evaluation  Patient Details  Name: Desiree [name] MRN: [id number] Date of Birth: Jan 09, 1946 Referring Provider (PT): Rodell Perna MD   Encounter Date: 01/09/2020  PT End of Session - 01/09/20 1156    Visit Number  1    Number of Visits  18    Date for PT Re-Evaluation  02/20/20    Authorization Type  [employer]    Progress Note Due on Visit  10    PT Start Time  1105    PT Stop Time  1150    PT Time Calculation (min)  45 min    Activity Tolerance  Patient tolerated treatment well;Patient limited by pain    Behavior During Therapy  [facility] for tasks assessed/performed       Past Medical History:  Diagnosis Date  . Allergic rhinitis   . Arthritis HIPS/ WRISTS  . Cancer ([place])   . DDD (degenerative disc disease), cervical   . GERD (gastroesophageal reflux disease) OCCASIONAL  . Heart murmur    turbulent flow through LVOT, no obstruction 08/26/18 echo  . History of Bell's palsy 2011  RIGHT SIDE -- RESOLVED  . 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 History of CVA (cerebrovascular accident) PER SCAN IN 2011  . Hyperlipidemia   . Hypertension   . Impaired fasting glucose PER PCP  DR SIMPSON   WATCH DIET  . Mixed urge and stress incontinence   . Nocturia   . Sinus drainage   . Thoracic ascending aortic aneurysm ([place]) LAST CHEST CT 09-02-2010--  FOLLOWED BY  CARDIOLOGIST--  DR NO:[id number]  . Vaginal wall prolapse     Past Surgical History:  Procedure Laterality Date  . ABDOMINAL HYSTERECTOMY    . BILATERAL CARPAL TUNNEL RELEASE  1980'S  . CARDIAC CATHETERIZATION  02-01-2009  DR EICHHORN   NORMAL CORONARY ARTERIES/ NORMAL LV SIZE AND FUNCTION/ AORTIC ROOT SIZE AT THE UPPER LIMIT OF NORMAL   . CARPAL TUNNEL RELEASE    . CERVICAL DISC SURGERY   12-12-2005  DR Vertell Limber   LEFT  C6 - C7 HERINATED / DDD/ SPONDYLOSIS  . COLONOSCOPY N/A 02/10/2014   Procedure: COLONOSCOPY;  Surgeon: Danie Binder, MD;  [facility];  Service: Endoscopy;  Laterality: N/A;  8:30  . CYSTOCELE REPAIR  08/02/2012   Procedure: ANTERIOR REPAIR (CYSTOCELE);  Surgeon: Ailene Rud, MD;  Location: [facility];  Service: Urology;  Laterality: N/A;  [employer] Uphold Anterior Pelvic Floor Sacrospinus Repair. Anterior wall of the vagina.  . ESOPHAGOGASTRODUODENOSCOPY N/A 02/10/2014   Procedure: ESOPHAGOGASTRODUODENOSCOPY (EGD);  Surgeon: Danie Binder, MD;  [facility];  Service: Endoscopy;  Laterality: N/A;  . GIVENS CAPSULE STUDY N/A 03/13/2014   Procedure: GIVENS CAPSULE STUDY;  Surgeon: Danie Binder, MD;  [facility];  Service: Endoscopy;  Laterality: N/A;  7:30  . NECK SURGERY  2007  . RIGHT PARTIAL MASTECTOMY  11-11-2004  DR [name] YOUNG   DUCTAL CARCINOMA IN SITU RIGHT BREAST  . TOTAL ABDOMINAL HYSTERECTOMY W/ BILATERAL SALPINGOOPHORECTOMY  1990  . TOTAL KNEE ARTHROPLASTY Right 12/21/2019   Procedure: RIGHT  TOTAL KNEE ARTHROPLASTY;  Surgeon: [name], MD;  Location: [facility];  Service: Orthopedics;  Laterality: Right;  . TRANSTHORACIC ECHOCARDIOGRAM  03-30-2008  DR MARGARET SIMPSON   LV SIZE AND FUNCTION NORMAL/ MODERATE AORTIC ARCH DILATATION/ MILD MR    There were no vitals filed for this visit.   Subjective Assessment - 01/09/20 1111    Subjective  Patient is a 74 y.o. female who presents to physical therapy s/p R TKA on 12/21/19. She reports she is limited by soreness, stiffness, sitting, transfers, and pain at night. It has been improving and she had HHPT and was working with them for about 2 weeks. She has been walking slow and has to use the [place]. Her main goal is to improve function. Patient states only thing that makes things better is pain meds. Pain decreases minimally with mobility.     Pertinent History  Cancer, chronic knee pain    Limitations  Sitting;Lifting;Standing;Walking;House hold activities    How long can you walk comfortably?  5 minutes    Patient Stated Goals  Improve function    Currently in Pain?  Yes    Pain Score  0-No pain   worst 5/10   Pain Location  Knee    Pain Orientation  Right    Pain Descriptors / Indicators  Aching    Pain Type  Surgical pain         [facility] PT Assessment - 01/09/20 0001      Assessment   Medical Diagnosis  R TKA    Referring Provider (PT)  Rodell Perna MD    Onset Date/Surgical Date  12/21/19    Next MD Visit  June    Prior Therapy  HHPT and hx knee rehab      Precautions   Precautions  None      Restrictions   Weight Bearing Restrictions  No      Balance Screen   Has the patient fallen in the past 6 months  No    Has the patient had a decrease in activity level because of a fear of falling?   Yes    Is the patient reluctant to leave their home because of a fear of falling?   Yes      [place] residence      Prior Function   Level of [place]  Retired    Art therapist, cooking, walking      Cognition   Overall Cognitive Status  Within Functional Limits for tasks assessed      Observation/Other Assessments   Observations  Patient ambulates with RW with antalgic gait with limited knee flexion/extension ROM on R    Skin Integrity  Surgical incision appears to be healing well, raised scar, hypomobile skin around R Knee    Focus on Therapeutic Outcomes (FOTO)   78% limited      ROM / Strength   AROM / PROM / Strength  AROM;Strength      AROM   Overall AROM Comments  Pain with ROM on R    AROM Assessment Site  Knee    Right/Left Knee  Right;Left    Right Knee Extension  5   lacking   Right Knee Flexion  80    Left Knee Extension  0    Left Knee Flexion  100      Strength   Overall Strength  Deficits    Overall Strength Comments   Limited by pain on RLE    Strength Assessment Site  Hip;Knee;Ankle    Right/Left Hip  Right;Left  Right Hip Flexion  3/5   pain   Left Hip Flexion  4-/5    Right/Left Knee  Right;Left    Right Knee Flexion  4-/5    Right Knee Extension  3+/5    Left Knee Flexion  5/5    Left Knee Extension  5/5    Right/Left Ankle  Right;Left    Right Ankle Dorsiflexion  4/5    Left Ankle Dorsiflexion  5/5      Palpation   Patella mobility  unable to assess secondary to pain     Palpation comment  TTP R quads, hamstrings, gastroc, anterior tib, medial and lateral knee; hypomobile scar      Transfers   Five time sit to stand comments   27.85 seconds    Comments  slow, labored, requires UE use      Ambulation/Gait   Ambulation/Gait  Yes    Ambulation/Gait Assistance  6: Modified independent (Device/Increase time)    Ambulation Distance (Feet)  200 Feet    Assistive device  Rolling Ruffini    Gait Pattern  Right foot flat;Right flexed knee in stance;Antalgic;Decreased hip/knee flexion - right    Ambulation Surface  Level;Indoor    Gait velocity  decreased    Gait Comments  2 MWT                  Objective measurements completed on examination: See above findings.              PT Education - 01/09/20 1113    Education Details  Patient educated on exam findings, POC, scope of PT, taking medication as prescribed, continuing with HEP from [place]. Review of HHPT HEP    Person(s) Educated  Patient    Methods  Explanation;Demonstration    Comprehension  Verbalized understanding;Returned demonstration       PT Short Term Goals - 01/09/20 1214      PT SHORT TERM GOAL #1   Title  Patient will be independent with HEP in order to improve functional outcomes.    Time  3    Period  Weeks    Status  New    Target Date  01/30/20      PT SHORT TERM GOAL #2   Title  Patient will report at least 25% improvement in symptoms for improved quality of life.    Time  3    Period   Weeks    Status  New    Target Date  01/30/20        PT Long Term Goals - 01/09/20 1214      PT LONG TERM GOAL #1   Title  Patient will report at least 75% improvement in symptoms for improved quality of life.    Time  6    Period  Weeks    Status  New    Target Date  02/20/20      PT LONG TERM GOAL #2   Title  Patient will improve FOTO score by at least 10 points in order to indicate improved tolerance to activity.    Time  6    Period  Weeks    Status  New    Target Date  02/20/20      PT LONG TERM GOAL #3   Title  Patient will be able to ambulate for at least 30 minutes with pain no greater than 1/10 in order to demonstrate improved ability to ambulate in the community.    Time  6    Period  Weeks    Status  New    Target Date  02/20/20      PT LONG TERM GOAL #4   Title  Patient will be able to complete 5x STS in under 11.4 seconds in order to reduce the risk of falls.    Time  6    Period  Weeks    Status  New    Target Date  02/20/20             Plan - 01/09/20 1208    Clinical Impression Statement  Patient is a 74 y.o. female who presents to physical therapy s/p R TKA on 12/21/19. She presents with pain limited deficits in R knee strength, ROM, endurance, activity tolerance, balance, scar mobility, gait, and functional mobility with ADL. She is having to modify and restrict ADL as indicated by FOTO score as well as subjective information and objective measures which is affecting overall participation. Patient will benefit from skilled physical therapy in order to improve function and reduce impairment.    Personal Factors and Comorbidities  Age;Behavior Pattern;Time since onset of injury/illness/exacerbation;Fitness;Comorbidity 3+    Comorbidities  obesity, HTN, chronic knee pain    Examination-Activity Limitations  Bathing;Bed Mobility;Bend;Lift;Locomotion Level;Sit;Sleep;Squat;Stairs;Stand;Transfers    Examination-Participation Restrictions   Church;Cleaning;Driving;Laundry;Meal Prep;Volunteer;Yard Work    Stability/Clinical Decision Making  Stable/Uncomplicated    Designer, jewellery  Low    Rehab Potential  Good    PT Frequency  3x / week    PT Duration  6 weeks    PT Treatment/Interventions  ADLs/Self Care Home Management;Aquatic Therapy;Biofeedback;Cryotherapy;Electrical Stimulation;Iontophoresis 4mg /ml Dexamethasone;Traction;Moist Heat;Ultrasound;Parrafin;Fluidtherapy;Contrast Bath;DME Instruction;Gait training;Stair training;Functional mobility training;Therapeutic activities;Therapeutic exercise;Balance training;Neuromuscular re-education;Patient/family education;Orthotic Fit/Training;Manual techniques;Compression bandaging;Scar mobilization;Passive range of motion;Dry needling;Energy conservation;Splinting;Taping;Vasopneumatic Device;Spinal Manipulations;Joint Manipulations    PT Next Visit Plan  initiate HEP, begin scar mobilzations, manual for pain ROM, and edema, improve knee flexion/extension ROM as tolerated, Review HHPT exercises and progress HEP as able, progress RLE strengthing, gait training and balance as able    PT Home Exercise Plan  initiate next session    Consulted and Agree with Plan of Care  Patient       Patient will benefit from skilled therapeutic intervention in order to improve the following deficits and impairments:  Abnormal gait, Decreased activity tolerance, Decreased balance, Decreased mobility, Decreased endurance, Decreased scar mobility, Decreased range of motion, Decreased strength, Difficulty walking, Hypomobility, Increased muscle spasms, Impaired perceived functional ability, Improper body mechanics, Obesity, Pain  Visit Diagnosis: Right knee pain, unspecified chronicity  Stiffness of right knee, not elsewhere classified  Other abnormalities of gait and mobility  Muscle weakness (generalized)     Problem List Patient Active Problem List   Diagnosis Date Noted  . S/P TKR (total  knee replacement), right 12/21/2019  . Pre-operative clearance 11/10/2019  . Acute cystitis with hematuria 11/10/2019  . Constipation 11/10/2019  . Unilateral primary osteoarthritis, right knee 10/30/2019  . Fatigue 10/18/2019  . Left shoulder pain 04/11/2019  . Ganglion cyst of volar aspect of left wrist 10/05/2018  . Fall at home, subsequent encounter 06/07/2018  . Knee pain, right 05/31/2017  . CKD (chronic kidney disease) stage 3, GFR 30-59 ml/min 10/01/2016  . Neck pain on left side 01/01/2016  . Annual physical exam 02/15/2015  . 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 Urinary incontinence 05/22/2014  . Metabolic syndrome X [id number]  . Osteopenia 12/12/2013  . Anemia 11/29/2013  . Obesity (BMI 30-39.9) 10/17/2012  . Thoracic  aortic aneurysm ([place]) 10/12/2012  . Allergic rhinitis 03/02/2012  . BLADDER PROLAPSE 09/24/2010  . NEOPLASM, MALIGNANT, BREAST, RIGHT 02/04/2008  . Hyperlipidemia LDL goal <100 02/04/2008  . Essential hypertension 02/04/2008    12:17 PM, 01/09/20 Mearl Latin PT, DPT Physical Therapist at [facility] [place], [place], [address] Phone: [phone]   Fax:  [phone]  Name: Desiree [name] MRN: [id number] Date of Birth: 10/26/1945

## 2020-01-11 ENCOUNTER — Other Ambulatory Visit: Payer: Self-pay

## 2020-01-11 ENCOUNTER — Encounter (HOSPITAL_COMMUNITY): Payer: Self-pay | Admitting: Physical Therapy

## 2020-01-11 ENCOUNTER — Ambulatory Visit (HOSPITAL_COMMUNITY): Payer: PPO | Admitting: Physical Therapy

## 2020-01-11 DIAGNOSIS — M25561 Pain in right knee: Secondary | ICD-10-CM | POA: Diagnosis not present

## 2020-01-11 DIAGNOSIS — M6281 Muscle weakness (generalized): Secondary | ICD-10-CM

## 2020-01-11 DIAGNOSIS — R2689 Other abnormalities of gait and mobility: Secondary | ICD-10-CM

## 2020-01-11 DIAGNOSIS — M25661 Stiffness of right knee, not elsewhere classified: Secondary | ICD-10-CM

## 2020-01-11 NOTE — Therapy (Signed)
[place], [place], [address] Phone: [phone]   Fax:  [phone]  Physical Therapy Treatment  Patient Details  Name: Desiree [name] MRN: [id number] Date of Birth: 02-02-1946 Referring Provider (PT): Rodell Perna MD   Encounter Date: 01/11/2020  PT End of Session - 01/11/20 1609    Visit Number  2    Number of Visits  18    Date for PT Re-Evaluation  02/20/20    Authorization Type  [employer]    Progress Note Due on Visit  10    PT Start Time  1520    PT Stop Time  1600    PT Time Calculation (min)  40 min    Activity Tolerance  Patient tolerated treatment well;Patient limited by pain    Behavior During Therapy  [facility] for tasks assessed/performed       Past Medical History:  Diagnosis Date  . Allergic rhinitis   . Arthritis HIPS/ WRISTS  . Cancer ([place])   . DDD (degenerative disc disease), cervical   . GERD (gastroesophageal reflux disease) OCCASIONAL  . Heart murmur    turbulent flow through LVOT, no obstruction 08/26/18 echo  . History of Bell's palsy 2011  RIGHT SIDE -- RESOLVED  . 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 History of CVA (cerebrovascular accident) PER SCAN IN 2011  . Hyperlipidemia   . Hypertension   . Impaired fasting glucose PER PCP  DR SIMPSON   WATCH DIET  . Mixed urge and stress incontinence   . Nocturia   . Sinus drainage   . Thoracic ascending aortic aneurysm ([place]) LAST CHEST CT 09-02-2010--  FOLLOWED BY  CARDIOLOGIST--  DR NO:[id number]  . Vaginal wall prolapse     Past Surgical History:  Procedure Laterality Date  . ABDOMINAL HYSTERECTOMY    . BILATERAL CARPAL TUNNEL RELEASE  1980'S  . CARDIAC CATHETERIZATION  02-01-2009  DR EICHHORN   NORMAL CORONARY ARTERIES/ NORMAL LV SIZE AND FUNCTION/ AORTIC ROOT SIZE AT THE UPPER LIMIT OF NORMAL   . CARPAL TUNNEL RELEASE    . CERVICAL DISC SURGERY   12-12-2005  DR Vertell Limber   LEFT  C6 - C7 HERINATED / DDD/ SPONDYLOSIS  . COLONOSCOPY N/A 02/10/2014   Procedure: COLONOSCOPY;  Surgeon: Danie Binder, MD;  [facility];  Service: Endoscopy;  Laterality: N/A;  8:30  . CYSTOCELE REPAIR  08/02/2012   Procedure: ANTERIOR REPAIR (CYSTOCELE);  Surgeon: Ailene Rud, MD;  Location: [facility];  Service: Urology;  Laterality: N/A;  [employer] Uphold Anterior Pelvic Floor Sacrospinus Repair. Anterior wall of the vagina.  . ESOPHAGOGASTRODUODENOSCOPY N/A 02/10/2014   Procedure: ESOPHAGOGASTRODUODENOSCOPY (EGD);  Surgeon: Danie Binder, MD;  [facility];  Service: Endoscopy;  Laterality: N/A;  . GIVENS CAPSULE STUDY N/A 03/13/2014   Procedure: GIVENS CAPSULE STUDY;  Surgeon: Danie Binder, MD;  [facility];  Service: Endoscopy;  Laterality: N/A;  7:30  . NECK SURGERY  2007  . RIGHT PARTIAL MASTECTOMY  11-11-2004  DR [name] YOUNG   DUCTAL CARCINOMA IN SITU RIGHT BREAST  . TOTAL ABDOMINAL HYSTERECTOMY W/ BILATERAL SALPINGOOPHORECTOMY  1990  . TOTAL KNEE ARTHROPLASTY Right 12/21/2019   Procedure: RIGHT  TOTAL KNEE ARTHROPLASTY;  Surgeon: [name], MD;  Location: [facility];  Service: Orthopedics;  Laterality: Right;  . TRANSTHORACIC ECHOCARDIOGRAM  03-30-2008  DR MARGARET SIMPSON   LV SIZE AND FUNCTION NORMAL/ MODERATE AORTIC ARCH DILATATION/ MILD MR    There were no vitals filed for this visit.  Subjective Assessment - 01/11/20 1520    Subjective  Patient states she continues to have soreness. She took pain medicine earlier. She is doing heel slides in chair, LAQ, bent knee fall outs, hip adductor isometric, heel slides in supine.    Pertinent History  Cancer, chronic knee pain    Limitations  Sitting;Lifting;Standing;Walking;House hold activities    How long can you walk comfortably?  5 minutes    Patient Stated Goals  Improve function    Currently in Pain?  Yes    Pain Score  3                          [facility] Adult PT Treatment/Exercise - 01/11/20 0001      Exercises   Exercises  Knee/Hip      Knee/Hip Exercises: Seated   Long Arc Quad  AROM;Right;10 reps    Long [employer] Limitations  5 second holds    Heel Slides  Right;10 reps    Heel Slides Limitations  5 second holds      Knee/Hip Exercises: Supine   Quad Sets  1 set;10 reps    Quad Sets Limitations  10 second holds     Heel Slides  10 reps    Heel Slides Limitations  5 second holds    Knee Extension  AROM    Knee Extension Limitations  lacking 3    Knee Flexion  AROM    Knee Flexion Limitations  85      Knee/Hip Exercises: Sidelying   Clams  2x10      Manual Therapy   Manual Therapy  Edema management;Soft tissue mobilization    Manual therapy comments  Manual complete separate than rest of tx    Edema Management  with LE elevated edema massage    Soft tissue mobilization  scar mobilizations, STM of distal quads             PT Education - 01/11/20 1527    Education Details  Patient educated on HEP, proper mechanics of exercise, resting position of knee, using heel prop while seated    Person(s) Educated  Patient    Methods  Explanation;Demonstration    Comprehension  Verbalized understanding;Returned demonstration       PT Short Term Goals - 01/11/20 1616      PT SHORT TERM GOAL #1   Title  Patient will be independent with HEP in order to improve functional outcomes.    Time  3    Period  Weeks    Status  On-going    Target Date  01/30/20      PT SHORT TERM GOAL #2   Title  Patient will report at least 25% improvement in symptoms for improved quality of life.    Time  3    Period  Weeks    Status  On-going    Target Date  01/30/20        PT Long Term Goals - 01/11/20 1616      PT LONG TERM GOAL #1   Title  Patient will report at least 75% improvement in symptoms for improved quality of life.    Time  6    Period  Weeks    Status  On-going      PT LONG  TERM GOAL #2   Title  Patient will improve FOTO score by at least 10 points in order to indicate improved tolerance to activity.    Time  6    Period  Weeks    Status  On-going      PT LONG TERM GOAL #3   Title  Patient will be able to ambulate for at least 30 minutes with pain no greater than 1/10 in order to demonstrate improved ability to ambulate in the community.    Time  6    Period  Weeks    Status  On-going      PT LONG TERM GOAL #4   Title  Patient will be able to complete 5x STS in under 11.4 seconds in order to reduce the risk of falls.    Time  6    Period  Weeks    Status  On-going            Plan - 01/11/20 1610    Clinical Impression Statement  Reviewed patient's HHPT HEP and discussed the most pertinent ones and had patient complete/added to HEP. Patient tolerates edema massage and gentle scar mobilizations well at beginning of session. Patient demonstrates improving R knee ROM in flexion/extension but is limited by pain. Patient able to complete exercises with good mechanics following verbal cueing but is limited mostly by pain throughout session. She is educated on not positioning knee in mid range positions while sitting at home and how to position self appropriately. Patient will continue to benefit from skilled physical therapy in order to reduce impairment and improve function.    Personal Factors and Comorbidities  Age;Behavior Pattern;Time since onset of injury/illness/exacerbation;Fitness;Comorbidity 3+    Comorbidities  obesity, HTN, chronic knee pain    Examination-Activity Limitations  Bathing;Bed Mobility;Bend;Lift;Locomotion Level;Sit;Sleep;Squat;Stairs;Stand;Transfers    Examination-Participation Restrictions  Church;Cleaning;Driving;Laundry;Meal Prep;Volunteer;Yard Work    Stability/Clinical Decision Making  Stable/Uncomplicated    Rehab Potential  Good    PT Frequency  3x / week    PT Duration  6 weeks    PT Treatment/Interventions  ADLs/Self Care  Home Management;Aquatic Therapy;Biofeedback;Cryotherapy;Electrical Stimulation;Iontophoresis 4mg /ml Dexamethasone;Traction;Moist Heat;Ultrasound;Parrafin;Fluidtherapy;Contrast Bath;DME Instruction;Gait training;Stair training;Functional mobility training;Therapeutic activities;Therapeutic exercise;Balance training;Neuromuscular re-education;Patient/family education;Orthotic Fit/Training;Manual techniques;Compression bandaging;Scar mobilization;Passive range of motion;Dry needling;Energy conservation;Splinting;Taping;Vasopneumatic Device;Spinal Manipulations;Joint Manipulations    PT Next Visit Plan  contine edema massage,  scar mobilzations, manual for pain ROM, and edema, improve knee flexion/extension ROM as tolerated,  progress HEP as able, progress RLE strengthing, gait training and balance as able    PT Home Exercise Plan  5/19 Heel slides in seated, heel slides in supine, quad sets, clams, LAQ    Consulted and Agree with Plan of Care  Patient       Patient will benefit from skilled therapeutic intervention in order to improve the following deficits and impairments:  Abnormal gait, Decreased activity tolerance, Decreased balance, Decreased mobility, Decreased endurance, Decreased scar mobility, Decreased range of motion, Decreased strength, Difficulty walking, Hypomobility, Increased muscle spasms, Impaired perceived functional ability, Improper body mechanics, Obesity, Pain  Visit Diagnosis: Right knee pain, unspecified chronicity  Stiffness of right knee, not elsewhere classified  Other abnormalities of gait and mobility  Muscle weakness (generalized)     Problem List Patient Active Problem List   Diagnosis Date Noted  . S/P TKR (total knee replacement), right 12/21/2019  . Pre-operative clearance 11/10/2019  . Acute cystitis with hematuria 11/10/2019  . Constipation 11/10/2019  . Unilateral primary osteoarthritis, right knee 10/30/2019  . Fatigue 10/18/2019  .  Left shoulder pain  04/11/2019  . Ganglion cyst of volar aspect of left wrist 10/05/2018  . Fall at home, subsequent encounter 06/07/2018  . Knee pain, right 05/31/2017  . CKD (chronic kidney disease) stage 3, GFR 30-59 ml/min 10/01/2016  . Neck pain on left side 01/01/2016  . Annual physical exam 02/15/2015  . 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 Urinary incontinence 05/22/2014  . Metabolic syndrome X [id number]  . Osteopenia 12/12/2013  . Anemia 11/29/2013  . Obesity (BMI 30-39.9) 10/17/2012  . Thoracic aortic aneurysm ([place]) 10/12/2012  . Allergic rhinitis 03/02/2012  . BLADDER PROLAPSE 09/24/2010  . NEOPLASM, MALIGNANT, BREAST, RIGHT 02/04/2008  . Hyperlipidemia LDL goal <100 02/04/2008  . Essential hypertension 02/04/2008    4:18 PM, 01/11/20 Mearl Latin PT, DPT Physical Therapist at [place], [place], [address] Phone: [phone]   Fax:  [phone]  Name: Desiree [name] MRN: [id number] Date of Birth: [birth year]

## 2020-01-12 ENCOUNTER — Ambulatory Visit (HOSPITAL_COMMUNITY): Payer: PPO | Admitting: Physical Therapy

## 2020-01-12 ENCOUNTER — Encounter (HOSPITAL_COMMUNITY): Payer: Self-pay | Admitting: Physical Therapy

## 2020-01-12 DIAGNOSIS — M25561 Pain in right knee: Secondary | ICD-10-CM | POA: Diagnosis not present

## 2020-01-12 DIAGNOSIS — M6281 Muscle weakness (generalized): Secondary | ICD-10-CM

## 2020-01-12 DIAGNOSIS — R2689 Other abnormalities of gait and mobility: Secondary | ICD-10-CM

## 2020-01-12 DIAGNOSIS — M25661 Stiffness of right knee, not elsewhere classified: Secondary | ICD-10-CM

## 2020-01-12 NOTE — Therapy (Signed)
[place], [place], [address] Phone: [phone]   Fax:  [phone]  Physical Therapy Treatment  Patient Details  Name: Desiree [name] MRN: [id number] Date of Birth: 1946-05-24 Referring Provider (PT): Rodell Perna MD   Encounter Date: 01/12/2020  PT End of Session - 01/12/20 0832    Visit Number  3    Number of Visits  18    Date for PT Re-Evaluation  02/20/20    Authorization Type  [employer]    Progress Note Due on Visit  10    PT Start Time  0831    PT Stop Time  0915    PT Time Calculation (min)  44 min    Activity Tolerance  Patient tolerated treatment well;Patient limited by pain    Behavior During Therapy  [facility] for tasks assessed/performed       Past Medical History:  Diagnosis Date  . Allergic rhinitis   . Arthritis HIPS/ WRISTS  . Cancer ([place])   . DDD (degenerative disc disease), cervical   . GERD (gastroesophageal reflux disease) OCCASIONAL  . Heart murmur    turbulent flow through LVOT, no obstruction 08/26/18 echo  . History of Bell's palsy 2011  RIGHT SIDE -- RESOLVED  . 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 History of CVA (cerebrovascular accident) PER SCAN IN 2011  . Hyperlipidemia   . Hypertension   . Impaired fasting glucose PER PCP  DR SIMPSON   WATCH DIET  . Mixed urge and stress incontinence   . Nocturia   . Sinus drainage   . Thoracic ascending aortic aneurysm ([place]) LAST CHEST CT 09-02-2010--  FOLLOWED BY  CARDIOLOGIST--  DR NO:[id number]  . Vaginal wall prolapse     Past Surgical History:  Procedure Laterality Date  . ABDOMINAL HYSTERECTOMY    . BILATERAL CARPAL TUNNEL RELEASE  1980'S  . CARDIAC CATHETERIZATION  02-01-2009  DR EICHHORN   NORMAL CORONARY ARTERIES/ NORMAL LV SIZE AND FUNCTION/ AORTIC ROOT SIZE AT THE UPPER LIMIT OF NORMAL   . CARPAL TUNNEL RELEASE    . CERVICAL DISC SURGERY   12-12-2005  DR Vertell Limber   LEFT  C6 - C7 HERINATED / DDD/ SPONDYLOSIS  . COLONOSCOPY N/A 02/10/2014   Procedure: COLONOSCOPY;  Surgeon: Danie Binder, MD;  [facility];  Service: Endoscopy;  Laterality: N/A;  8:30  . CYSTOCELE REPAIR  08/02/2012   Procedure: ANTERIOR REPAIR (CYSTOCELE);  Surgeon: Ailene Rud, MD;  Location: [facility];  Service: Urology;  Laterality: N/A;  [employer] Uphold Anterior Pelvic Floor Sacrospinus Repair. Anterior wall of the vagina.  . ESOPHAGOGASTRODUODENOSCOPY N/A 02/10/2014   Procedure: ESOPHAGOGASTRODUODENOSCOPY (EGD);  Surgeon: Danie Binder, MD;  [facility];  Service: Endoscopy;  Laterality: N/A;  . GIVENS CAPSULE STUDY N/A 03/13/2014   Procedure: GIVENS CAPSULE STUDY;  Surgeon: Danie Binder, MD;  [facility];  Service: Endoscopy;  Laterality: N/A;  7:30  . NECK SURGERY  2007  . RIGHT PARTIAL MASTECTOMY  11-11-2004  DR [name] YOUNG   DUCTAL CARCINOMA IN SITU RIGHT BREAST  . TOTAL ABDOMINAL HYSTERECTOMY W/ BILATERAL SALPINGOOPHORECTOMY  1990  . TOTAL KNEE ARTHROPLASTY Right 12/21/2019   Procedure: RIGHT  TOTAL KNEE ARTHROPLASTY;  Surgeon: [name], MD;  Location: [facility];  Service: Orthopedics;  Laterality: Right;  . TRANSTHORACIC ECHOCARDIOGRAM  03-30-2008  DR MARGARET SIMPSON   LV SIZE AND FUNCTION NORMAL/ MODERATE AORTIC ARCH DILATATION/ MILD MR    There were no vitals filed for this visit.  Subjective Assessment - 01/12/20 0836    Subjective  States that it was really swollen last night but it is better. States her pain is all the way around her knee 2/10 currently    Pertinent History  Cancer, chronic knee pain    Limitations  Sitting;Lifting;Standing;Walking;House hold activities    How long can you walk comfortably?  5 minutes    Patient Stated Goals  Improve function    Currently in Pain?  Yes    Pain Score  2     Pain Location  Knee    Pain Orientation  Right    Pain Descriptors  / Indicators  Aching         [facility] PT Assessment - 01/12/20 0001      Assessment   Medical Diagnosis  R TKA    Referring Provider (PT)  Rodell Perna MD    Onset Date/Surgical Date  12/21/19                    [facility] Adult PT Treatment/Exercise - 01/12/20 0001      Ambulation/Gait   Ambulation/Gait  Yes    Ambulation/Gait Assistance  6: Modified independent (Device/Increase time)    Assistive device  Rolling Maldonado;Small based quad cane    Gait Pattern  Right foot flat;Right flexed knee in stance;Antalgic;Decreased hip/knee flexion - right    Ambulation Surface  Level;Indoor    Gait velocity  decreased    Gait Comments  1 lap 226 with RW with verbal cues to increase step length and keep hips forward; one lap 226 feet with quad cane - 2 standing rest breaks       Knee/Hip Exercises: Aerobic   Nustep  pos 7, 4 minutes - focus on ROM       Knee/Hip Exercises: Supine   Straight Leg Raises  10 reps;Right;Strengthening   PT assist at first - focus on TKE    Knee Extension  AROM    Knee Extension Limitations  lacking 3    Knee Flexion  AROM    Knee Flexion Limitations  95    Other Supine Knee/Hip Exercises  PROM right knee flexion and extension with holds at end range - 10 minutes total       Manual Therapy   Manual Therapy  Edema management;Soft tissue mobilization    Manual therapy comments  Manual complete separate than rest of tx    Edema Management  edema massage surrounding R knee     Soft tissue mobilization  scar mobilizations, STM of distal quads               PT Short Term Goals - 01/11/20 1616      PT SHORT TERM GOAL #1   Title  Patient will be independent with HEP in order to improve functional outcomes.    Time  3    Period  Weeks    Status  On-going    Target Date  01/30/20      PT SHORT TERM GOAL #2   Title  Patient will report at least 25% improvement in symptoms for improved quality of life.    Time  3    Period  Weeks    Status   On-going    Target Date  01/30/20  PT Long Term Goals - 01/11/20 1616      PT LONG TERM GOAL #1   Title  Patient will report at least 75% improvement in symptoms for improved quality of life.    Time  6    Period  Weeks    Status  On-going      PT LONG TERM GOAL #2   Title  Patient will improve FOTO score by at least 10 points in order to indicate improved tolerance to activity.    Time  6    Period  Weeks    Status  On-going      PT LONG TERM GOAL #3   Title  Patient will be able to ambulate for at least 30 minutes with pain no greater than 1/10 in order to demonstrate improved ability to ambulate in the community.    Time  6    Period  Weeks    Status  On-going      PT LONG TERM GOAL #4   Title  Patient will be able to complete 5x STS in under 11.4 seconds in order to reduce the risk of falls.    Time  6    Period  Weeks    Status  On-going            Plan - 01/12/20 0913    Clinical Impression Statement  Focused on knee ROM today, able to improve knee flexion to 95 degrees. Gait trained with Vayda and then with quad cane, patient tolerated this moderately well and demonstrated improved step length and confidence towards end of training. Instructed patient to practice with quad cane at home for short distances. Patient still wants to use [place] for fear of falling but was able to demonstrate safe mechanics with walking today. Reassured patient and will follow up with this next session.    Personal Factors and Comorbidities  Age;Behavior Pattern;Time since onset of injury/illness/exacerbation;Fitness;Comorbidity 3+    Comorbidities  obesity, HTN, chronic knee pain    Examination-Activity Limitations  Bathing;Bed Mobility;Bend;Lift;Locomotion Level;Sit;Sleep;Squat;Stairs;Stand;Transfers    Examination-Participation Restrictions  Church;Cleaning;Driving;Laundry;Meal Prep;Volunteer;Yard Work    Stability/Clinical Decision Making  Stable/Uncomplicated    Rehab  Potential  Good    PT Frequency  3x / week    PT Duration  6 weeks    PT Treatment/Interventions  ADLs/Self Care Home Management;Aquatic Therapy;Biofeedback;Cryotherapy;Electrical Stimulation;Iontophoresis 4mg /ml Dexamethasone;Traction;Moist Heat;Ultrasound;Parrafin;Fluidtherapy;Contrast Bath;DME Instruction;Gait training;Stair training;Functional mobility training;Therapeutic activities;Therapeutic exercise;Balance training;Neuromuscular re-education;Patient/family education;Orthotic Fit/Training;Manual techniques;Compression bandaging;Scar mobilization;Passive range of motion;Dry needling;Energy conservation;Splinting;Taping;Vasopneumatic Device;Spinal Manipulations;Joint Manipulations    PT Next Visit Plan  contine edema massage,  scar mobilzations, manual for pain ROM, and edema, improve knee flexion/extension ROM as tolerated,  progress HEP as able, progress RLE strengthing, gait training and balance as able    PT Home Exercise Plan  5/19 Heel slides in seated, heel slides in supine, quad sets, clams, LAQ; 5/20 gait training with quad cane at home    Consulted and Agree with Plan of Care  Patient       Patient will benefit from skilled therapeutic intervention in order to improve the following deficits and impairments:  Abnormal gait, Decreased activity tolerance, Decreased balance, Decreased mobility, Decreased endurance, Decreased scar mobility, Decreased range of motion, Decreased strength, Difficulty walking, Hypomobility, Increased muscle spasms, Impaired perceived functional ability, Improper body mechanics, Obesity, Pain  Visit Diagnosis: Right knee pain, unspecified chronicity  Stiffness of right knee, not elsewhere classified  Other abnormalities of gait and mobility  Muscle weakness (generalized)  Problem List Patient Active Problem List   Diagnosis Date Noted  . S/P TKR (total knee replacement), right 12/21/2019  . Pre-operative clearance 11/10/2019  . Acute cystitis  with hematuria 11/10/2019  . Constipation 11/10/2019  . Unilateral primary osteoarthritis, right knee 10/30/2019  . Fatigue 10/18/2019  . Left shoulder pain 04/11/2019  . Ganglion cyst of volar aspect of left wrist 10/05/2018  . Fall at home, subsequent encounter 06/07/2018  . Knee pain, right 05/31/2017  . CKD (chronic kidney disease) stage 3, GFR 30-59 ml/min 10/01/2016  . Neck pain on left side 01/01/2016  . Annual physical exam 02/15/2015  . 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 Urinary incontinence 05/22/2014  . Metabolic syndrome X [id number]  . Osteopenia 12/12/2013  . Anemia 11/29/2013  . Obesity (BMI 30-39.9) 10/17/2012  . Thoracic aortic aneurysm ([place]) 10/12/2012  . Allergic rhinitis 03/02/2012  . BLADDER PROLAPSE 09/24/2010  . NEOPLASM, MALIGNANT, BREAST, RIGHT 02/04/2008  . Hyperlipidemia LDL goal <100 02/04/2008  . Essential hypertension 02/04/2008   9:17 AM, 01/12/20 Jerene Pitch, DPT Physical Therapy with [facility]  [phone] office  [place] [address] [place], [place], [address] Phone: [phone]   Fax:  [phone]  Name: Desiree [name] MRN: [id number] Date of Birth: December 02, 1945

## 2020-01-16 ENCOUNTER — Ambulatory Visit (HOSPITAL_COMMUNITY): Payer: PPO | Admitting: Physical Therapy

## 2020-01-16 ENCOUNTER — Encounter (HOSPITAL_COMMUNITY): Payer: Self-pay | Admitting: Physical Therapy

## 2020-01-16 ENCOUNTER — Other Ambulatory Visit: Payer: Self-pay

## 2020-01-16 DIAGNOSIS — M6281 Muscle weakness (generalized): Secondary | ICD-10-CM

## 2020-01-16 DIAGNOSIS — M25661 Stiffness of right knee, not elsewhere classified: Secondary | ICD-10-CM

## 2020-01-16 DIAGNOSIS — M25561 Pain in right knee: Secondary | ICD-10-CM

## 2020-01-16 DIAGNOSIS — R2689 Other abnormalities of gait and mobility: Secondary | ICD-10-CM

## 2020-01-16 NOTE — Therapy (Signed)
[facility] [place], [place], [address] Phone: [phone]   Fax:  [phone]  Physical Therapy Treatment  Patient Details  Name: Desiree [name] MRN: [id number] Date of Birth: [birth year] Referring Provider (PT): Rodell Perna MD   Encounter Date: 01/16/2020  PT End of Session - 01/16/20 1129    Visit Number  4    Number of Visits  18    Date for PT Re-Evaluation  02/20/20    Authorization Type  [employer]    Progress Note Due on Visit  10    PT Start Time  1116    PT Stop Time  1158    PT Time Calculation (min)  42 min    Activity Tolerance  Patient tolerated treatment well;Patient limited by pain    Behavior During Therapy  [facility] for tasks assessed/performed       Past Medical History:  Diagnosis Date  . Allergic rhinitis   . Arthritis HIPS/ WRISTS  . Cancer ([place])   . DDD (degenerative disc disease), cervical   . GERD (gastroesophageal reflux disease) OCCASIONAL  . Heart murmur    turbulent flow through LVOT, no obstruction 08/26/18 echo  . History of Bell's palsy 2011  RIGHT SIDE -- RESOLVED  . 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 History of CVA (cerebrovascular accident) PER SCAN IN 2011  . Hyperlipidemia   . Hypertension   . Impaired fasting glucose PER PCP  DR SIMPSON   WATCH DIET  . Mixed urge and stress incontinence   . Nocturia   . Sinus drainage   . Thoracic ascending aortic aneurysm ([place]) LAST CHEST CT 09-02-2010--  FOLLOWED BY  CARDIOLOGIST--  DR [id number]  . Vaginal wall prolapse     Past Surgical History:  Procedure Laterality Date  . ABDOMINAL HYSTERECTOMY    . BILATERAL CARPAL TUNNEL RELEASE  1980'S  . CARDIAC CATHETERIZATION  02-01-2009  DR EICHHORN   NORMAL CORONARY ARTERIES/ NORMAL LV SIZE AND FUNCTION/ AORTIC ROOT SIZE AT THE UPPER LIMIT OF NORMAL   . CARPAL TUNNEL RELEASE    . CERVICAL DISC SURGERY   12-12-2005  DR Vertell Limber   LEFT  C6 - C7 HERINATED / DDD/ SPONDYLOSIS  . COLONOSCOPY N/A 02/10/2014   Procedure: COLONOSCOPY;  Surgeon: Danie Binder, MD;  [facility];  Service: Endoscopy;  Laterality: N/A;  8:30  . CYSTOCELE REPAIR  08/02/2012   Procedure: ANTERIOR REPAIR (CYSTOCELE);  Surgeon: Ailene Rud, MD;  Location: [facility];  Service: Urology;  Laterality: N/A;  [employer] Uphold Anterior Pelvic Floor Sacrospinus Repair. Anterior wall of the vagina.  . ESOPHAGOGASTRODUODENOSCOPY N/A 02/10/2014   Procedure: ESOPHAGOGASTRODUODENOSCOPY (EGD);  Surgeon: Danie Binder, MD;  [facility];  Service: Endoscopy;  Laterality: N/A;  . GIVENS CAPSULE STUDY N/A 03/13/2014   Procedure: GIVENS CAPSULE STUDY;  Surgeon: Danie Binder, MD;  [facility];  Service: Endoscopy;  Laterality: N/A;  7:30  . NECK SURGERY  2007  . RIGHT PARTIAL MASTECTOMY  11-11-2004  DR [name] YOUNG   DUCTAL CARCINOMA IN SITU RIGHT BREAST  . TOTAL ABDOMINAL HYSTERECTOMY W/ BILATERAL SALPINGOOPHORECTOMY  1990  . TOTAL KNEE ARTHROPLASTY Right 12/21/2019   Procedure: RIGHT  TOTAL KNEE ARTHROPLASTY;  Surgeon: [name], MD;  Location: [place];  Service: Orthopedics;  Laterality: Right;  . TRANSTHORACIC ECHOCARDIOGRAM  03-30-2008  DR MARGARET SIMPSON   LV SIZE AND FUNCTION NORMAL/ MODERATE AORTIC ARCH DILATATION/ MILD MR    There were no vitals filed for this visit.  Subjective Assessment - 01/16/20 1117    Subjective  Patient states she twisted it while she was sleeping and it was really painful. Her exercises are going pretty good at home. Her heel slides while seated are difficult.    Pertinent History  Cancer, chronic knee pain    Limitations  Sitting;Lifting;Standing;Walking;House hold activities    How long can you walk comfortably?  5 minutes    Patient Stated Goals  Improve function    Currently in Pain?  Yes    Pain Score  3     Pain Location  Knee                         [facility] Adult PT Treatment/Exercise - 01/16/20 0001      Knee/Hip Exercises: Standing   Heel Raises  15 reps    Terminal Knee Extension  1 set;10 reps    Terminal Knee Extension Limitations  purple tube band 1x10 with 10 second holds    Forward Step Up  2 sets;10 reps;Hand Hold: 1;Step Height: 4"      Knee/Hip Exercises: Seated   Sit to Sand  2 sets;10 reps      Knee/Hip Exercises: Supine   Quad Sets  1 set;10 reps    Quad Sets Limitations  10 second holds    Bridges  Both;2 sets;10 reps    Bridges Limitations  mini bridge with prior glute contraction    Straight Leg Raises  10 reps;Right;Strengthening    Straight Leg Raises Limitations  with quad set for TKE strength    Knee Extension  AROM    Knee Extension Limitations  lacking 3    Knee Flexion  AROM    Knee Flexion Limitations  93      Knee/Hip Exercises: Sidelying   Clams  2x15             PT Education - 01/16/20 1129    Education Details  Patient educated on HEP, proper mechanics of exercise, resting position of knee, using heel prop while seated    Person(s) Educated  Patient    Methods  Explanation;Demonstration    Comprehension  Verbalized understanding;Returned demonstration       PT Short Term Goals - 01/11/20 1616      PT SHORT TERM GOAL #1   Title  Patient will be independent with HEP in order to improve functional outcomes.    Time  3    Period  Weeks    Status  On-going    Target Date  01/30/20      PT SHORT TERM GOAL #2   Title  Patient will report at least 25% improvement in symptoms for improved quality of life.    Time  3    Period  Weeks    Status  On-going    Target Date  01/30/20        PT Long Term Goals - 01/11/20 1616      PT LONG TERM GOAL #1   Title  Patient will report at least 75% improvement in symptoms for improved quality of life.    Time  6    Period  Weeks    Status  On-going      PT LONG TERM GOAL #2   Title  Patient will  improve FOTO score by at least 10 points in order to indicate improved tolerance to activity.    Time  6    Period  Weeks    Status  On-going      PT LONG TERM GOAL #3   Title  Patient will be able to ambulate for at least 30 minutes with pain no greater than 1/10 in order to demonstrate improved ability to ambulate in the community.    Time  6    Period  Weeks    Status  On-going      PT LONG TERM GOAL #4   Title  Patient will be able to complete 5x STS in under 11.4 seconds in order to reduce the risk of falls.    Time  6    Period  Weeks    Status  On-going            Plan - 01/16/20 1130    Clinical Impression Statement  Patient demonstrates slightly reduced knee flexion ROM compared to last session. She demonstrates impaired quad strength with quad lag with SLR. She shows about 5-10 degrees extension lag and requires verbal cueing and demonstration to improve lag. She has difficulty with bridge exercise secondary to impaired hip strength and lack of knee flexion ROM. Patient requires verbal and tactile cueing for decreasing weight shift off RLE and reliance on LLE with sit to stand exercise. She is educated on avoiding compensatory strategies and how to complete safely at home. She fatigues quickly with TKE exercise and requires min/mod verbal cueing for end range extension holds. Patient will continue to benefit from skilled physical therapy in order to reduce impairment and improve function.    Personal Factors and Comorbidities  Age;Behavior Pattern;Time since onset of injury/illness/exacerbation;Fitness;Comorbidity 3+    Comorbidities  obesity, HTN, chronic knee pain    Examination-Activity Limitations  Bathing;Bed Mobility;Bend;Lift;Locomotion Level;Sit;Sleep;Squat;Stairs;Stand;Transfers    Examination-Participation Restrictions  Church;Cleaning;Driving;Laundry;Meal Prep;Volunteer;Yard Work    Stability/Clinical Decision Making  Stable/Uncomplicated    Rehab Potential  Good     PT Frequency  3x / week    PT Duration  6 weeks    PT Treatment/Interventions  ADLs/Self Care Home Management;Aquatic Therapy;Biofeedback;Cryotherapy;Electrical Stimulation;Iontophoresis 4mg /ml Dexamethasone;Traction;Moist Heat;Ultrasound;Parrafin;Fluidtherapy;Contrast Bath;DME Instruction;Gait training;Stair training;Functional mobility training;Therapeutic activities;Therapeutic exercise;Balance training;Neuromuscular re-education;Patient/family education;Orthotic Fit/Training;Manual techniques;Compression bandaging;Scar mobilization;Passive range of motion;Dry needling;Energy conservation;Splinting;Taping;Vasopneumatic Device;Spinal Manipulations;Joint Manipulations    PT Next Visit Plan  contine edema massage,  scar mobilzations, manual for pain ROM, and edema, improve knee flexion/extension ROM as tolerated,  progress HEP as able, progress RLE strengthing, gait training and balance as able    PT Home Exercise Plan  5/19 Heel slides in seated, heel slides in supine, quad sets, clams, LAQ; 5/20 gait training with quad cane at home 5/24 sit to stands    Consulted and Agree with Plan of Care  Patient       Patient will benefit from skilled therapeutic intervention in order to improve the following deficits and impairments:  Abnormal gait, Decreased activity tolerance, Decreased balance, Decreased mobility, Decreased endurance, Decreased scar mobility, Decreased range of motion, Decreased strength, Difficulty walking, Hypomobility, Increased muscle spasms, Impaired perceived functional ability, Improper body mechanics, Obesity, Pain  Visit Diagnosis: Right knee pain, unspecified chronicity  Stiffness of right knee, not elsewhere classified  Other abnormalities of gait and mobility  Muscle weakness (generalized)     Problem List Patient Active Problem List   Diagnosis Date Noted  . S/P TKR (total knee replacement), right 12/21/2019  .  Pre-operative clearance 11/10/2019  . Acute cystitis  with hematuria 11/10/2019  . Constipation 11/10/2019  . Unilateral primary osteoarthritis, right knee 10/30/2019  . Fatigue 10/18/2019  . Left shoulder pain 04/11/2019  . Ganglion cyst of volar aspect of left wrist 10/05/2018  . Fall at home, subsequent encounter 06/07/2018  . Knee pain, right 05/31/2017  . CKD (chronic kidney disease) stage 3, GFR 30-59 ml/min 10/01/2016  . Neck pain on left side 01/01/2016  . Annual physical exam 02/15/2015  . 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 Urinary incontinence 05/22/2014  . Metabolic syndrome X [id number]  . Osteopenia 12/12/2013  . Anemia 11/29/2013  . Obesity (BMI 30-39.9) 10/17/2012  . Thoracic aortic aneurysm ([place]) 10/12/2012  . Allergic rhinitis 03/02/2012  . BLADDER PROLAPSE 09/24/2010  . NEOPLASM, MALIGNANT, BREAST, RIGHT 02/04/2008  . Hyperlipidemia LDL goal <100 02/04/2008  . Essential hypertension 02/04/2008    12:01 PM, 01/16/20 Mearl Latin PT, DPT Physical Therapist at [place], [place], [address] Phone: [phone]   Fax:  [phone]  Name: Desiree [name] MRN: [id number] Date of Birth: 1945-12-13

## 2020-01-18 ENCOUNTER — Ambulatory Visit (HOSPITAL_COMMUNITY): Payer: PPO | Admitting: Physical Therapy

## 2020-01-18 ENCOUNTER — Other Ambulatory Visit: Payer: Self-pay

## 2020-01-18 ENCOUNTER — Telehealth: Payer: Self-pay | Admitting: Orthopaedic Surgery

## 2020-01-18 ENCOUNTER — Other Ambulatory Visit: Payer: Self-pay | Admitting: Orthopaedic Surgery

## 2020-01-18 ENCOUNTER — Encounter (HOSPITAL_COMMUNITY): Payer: Self-pay | Admitting: Physical Therapy

## 2020-01-18 DIAGNOSIS — R2689 Other abnormalities of gait and mobility: Secondary | ICD-10-CM

## 2020-01-18 DIAGNOSIS — M25661 Stiffness of right knee, not elsewhere classified: Secondary | ICD-10-CM

## 2020-01-18 DIAGNOSIS — M6281 Muscle weakness (generalized): Secondary | ICD-10-CM

## 2020-01-18 DIAGNOSIS — M25561 Pain in right knee: Secondary | ICD-10-CM

## 2020-01-18 MED ORDER — HYDROCODONE-ACETAMINOPHEN 5-325 MG PO TABS: 1.0000 | ORAL_TABLET | Freq: Four times a day (QID) | ORAL | 0 refills | Status: DC | PRN

## 2020-01-18 NOTE — Progress Notes (Signed)
Sent in [place] ,   Was on the 7.5.

## 2020-01-18 NOTE — Telephone Encounter (Signed)
Patient aware Rx sent into pharmacy

## 2020-01-18 NOTE — Telephone Encounter (Signed)
Patient called requesting a refill of hydrocodone. Please send to pharmacy on file. Patient phone number is [phone].

## 2020-01-18 NOTE — Telephone Encounter (Signed)
Ucall I sent in the norco 5/325 she  was on the 7.5 . Ucall thanks

## 2020-01-18 NOTE — Therapy (Signed)
[facility] [place], [place], [address] Phone: [phone]   Fax:  [phone]  Physical Therapy Treatment  Patient Details  Name: Desiree [name] MRN: [id number] Date of Birth: May 01, 1946 Referring Provider (PT): Rodell Perna MD   Encounter Date: 01/18/2020  PT End of Session - 01/18/20 1203    Visit Number  5    Number of Visits  18    Date for PT Re-Evaluation  02/20/20    Authorization Type  [employer]    Progress Note Due on Visit  10    PT Start Time  1120    PT Stop Time  1200    PT Time Calculation (min)  40 min    Activity Tolerance  Patient tolerated treatment well    Behavior During Therapy  [facility] for tasks assessed/performed       Past Medical History:  Diagnosis Date  . Allergic rhinitis   . Arthritis HIPS/ WRISTS  . Cancer ([place])   . DDD (degenerative disc disease), cervical   . GERD (gastroesophageal reflux disease) OCCASIONAL  . Heart murmur    turbulent flow through LVOT, no obstruction 08/26/18 echo  . History of Bell's palsy 2011  RIGHT SIDE -- RESOLVED  . 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 History of CVA (cerebrovascular accident) PER SCAN IN 2011  . Hyperlipidemia   . Hypertension   . Impaired fasting glucose PER PCP  DR SIMPSON   WATCH DIET  . Mixed urge and stress incontinence   . Nocturia   . Sinus drainage   . Thoracic ascending aortic aneurysm ([place]) LAST CHEST CT 09-02-2010--  FOLLOWED BY  CARDIOLOGIST--  DR [id number]  . Vaginal wall prolapse     Past Surgical History:  Procedure Laterality Date  . ABDOMINAL HYSTERECTOMY    . BILATERAL CARPAL TUNNEL RELEASE  1980'S  . CARDIAC CATHETERIZATION  02-01-2009  DR EICHHORN   NORMAL CORONARY ARTERIES/ NORMAL LV SIZE AND FUNCTION/ AORTIC ROOT SIZE AT THE UPPER LIMIT OF NORMAL   . CARPAL TUNNEL RELEASE    . CERVICAL DISC SURGERY  12-12-2005  DR Vertell Limber   LEFT   C6 - C7 HERINATED / DDD/ SPONDYLOSIS  . COLONOSCOPY N/A 02/10/2014   Procedure: COLONOSCOPY;  Surgeon: Danie Binder, MD;  [facility];  Service: Endoscopy;  Laterality: N/A;  8:30  . CYSTOCELE REPAIR  08/02/2012   Procedure: ANTERIOR REPAIR (CYSTOCELE);  Surgeon: Ailene Rud, MD;  [facility];  Service: Urology;  Laterality: N/A;  [employer] Uphold Anterior Pelvic Floor Sacrospinus Repair. Anterior wall of the vagina.  . ESOPHAGOGASTRODUODENOSCOPY N/A 02/10/2014   Procedure: ESOPHAGOGASTRODUODENOSCOPY (EGD);  Surgeon: Danie Binder, MD;  [facility];  Service: Endoscopy;  Laterality: N/A;  . GIVENS CAPSULE STUDY N/A 03/13/2014   Procedure: GIVENS CAPSULE STUDY;  Surgeon: Danie Binder, MD;  [facility];  Service: Endoscopy;  Laterality: N/A;  7:30  . NECK SURGERY  2007  . RIGHT PARTIAL MASTECTOMY  11-11-2004  DR [name] YOUNG   DUCTAL CARCINOMA IN SITU RIGHT BREAST  . TOTAL ABDOMINAL HYSTERECTOMY W/ BILATERAL SALPINGOOPHORECTOMY  1990  . TOTAL KNEE ARTHROPLASTY Right 12/21/2019   Procedure: RIGHT  TOTAL KNEE ARTHROPLASTY;  Surgeon: [name], MD;  Location: [place] [facility];  Service: Orthopedics;  Laterality: Right;  . TRANSTHORACIC ECHOCARDIOGRAM  03-30-2008  DR Joycelyn Schmid  SIMPSON   LV SIZE AND FUNCTION NORMAL/ MODERATE AORTIC ARCH DILATATION/ MILD MR    There were no vitals filed for this visit.  Subjective Assessment - 01/18/20 1119    Subjective  Patient states she was sore after last session and sore after exercises this morning. Exercises are going alright but slight pain with knee flexion stretch.    Pertinent History  Cancer, chronic knee pain    Limitations  Sitting;Lifting;Standing;Walking;House hold activities    How long can you walk comfortably?  5 minutes    Patient Stated Goals  Improve function    Currently in Pain?  Yes    Pain Score  2     Pain Location  Knee    Pain Orientation  Right                         [facility] Adult PT Treatment/Exercise - 01/18/20 0001      Ambulation/Gait   Ambulation/Gait  Yes    Ambulation/Gait Assistance  6: Modified independent (Device/Increase time)    Ambulation Distance (Feet)  226 Feet    Assistive device  Straight cane    Gait Pattern  Right foot flat;Right flexed knee in stance;Antalgic;Decreased hip/knee flexion - right    Ambulation Surface  Level;Indoor    Gait velocity  decreased    Gait Comments  verbal cueing for cane use, sequencing, improving posture      Knee/Hip Exercises: Standing   Terminal Knee Extension  1 set;10 reps    Terminal Knee Extension Limitations  purple tube band 1x10 with 10 second holds    Lateral Step Up  2 sets;10 reps;Hand Hold: 1;Step Height: 4"    Forward Step Up  2 sets;10 reps;Hand Hold: 1;Step Height: 4"      Knee/Hip Exercises: Seated   Sit to Sand  2 sets;10 reps      Knee/Hip Exercises: Supine   Straight Leg Raises  10 reps;Right;Strengthening;5 reps    Straight Leg Raises Limitations  with quad set for TKE strength    Knee Extension  AROM    Knee Extension Limitations  lacking 2    Knee Flexion  AROM    Knee Flexion Limitations  96      Manual Therapy   Manual Therapy  Edema management;Soft tissue mobilization    Manual therapy comments  Manual complete separate than rest of tx    Edema Management  edema massage surrounding R knee     Soft tissue mobilization  scar mobilizations, STM of distal quads             PT Education - 01/18/20 1122    Education Details  Patient educated on HEP, proper mechanics of exercise, gait mechanics, using cane at home    Person(s) Educated  Patient    Methods  Explanation;Demonstration    Comprehension  Verbalized understanding;Returned demonstration       PT Short Term Goals - 01/11/20 1616      PT SHORT TERM GOAL #1   Title  Patient will be independent with HEP in order to improve functional outcomes.    Time  3    Period   Weeks    Status  On-going    Target Date  01/30/20      PT SHORT TERM GOAL #2   Title  Patient will report at least 25% improvement in symptoms for improved quality of life.    Time  3  Period  Weeks    Status  On-going    Target Date  01/30/20        PT Long Term Goals - 01/11/20 1616      PT LONG TERM GOAL #1   Title  Patient will report at least 75% improvement in symptoms for improved quality of life.    Time  6    Period  Weeks    Status  On-going      PT LONG TERM GOAL #2   Title  Patient will improve FOTO score by at least 10 points in order to indicate improved tolerance to activity.    Time  6    Period  Weeks    Status  On-going      PT LONG TERM GOAL #3   Title  Patient will be able to ambulate for at least 30 minutes with pain no greater than 1/10 in order to demonstrate improved ability to ambulate in the community.    Time  6    Period  Weeks    Status  On-going      PT LONG TERM GOAL #4   Title  Patient will be able to complete 5x STS in under 11.4 seconds in order to reduce the risk of falls.    Time  6    Period  Weeks    Status  On-going            Plan - 01/18/20 1203    Clinical Impression Statement  Patient demonstrates slight increase in knee flexion/extension ROM since last session. She continues to demonstrate quad lag with SLR exercise secondary to quad weakness. She feels improvement in symptoms following manual therapy. She demonstrates weight shift of RLE on to LLE with sit to stand exercise and requires tactile cueing to keep feet in correct position. She is able to ambulate today with verbal cueing for [facility] use and posture which she tolerates well without loss of balance. She is educated on using [place] at home if she feels safe and steady with it. She completes step ups with decreased weight through UE. She fatigues quickly with TKE exercise. Patient will continue to benefit from skilled physical therapy in order to reduce impairment and  improve function.    Personal Factors and Comorbidities  Age;Behavior Pattern;Time since onset of injury/illness/exacerbation;Fitness;Comorbidity 3+    Comorbidities  obesity, HTN, chronic knee pain    Examination-Activity Limitations  Bathing;Bed Mobility;Bend;Lift;Locomotion Level;Sit;Sleep;Squat;Stairs;Stand;Transfers    Examination-Participation Restrictions  Church;Cleaning;Driving;Laundry;Meal Prep;Volunteer;Yard Work    Stability/Clinical Decision Making  Stable/Uncomplicated    Rehab Potential  Good    PT Frequency  3x / week    PT Duration  6 weeks    PT Treatment/Interventions  ADLs/Self Care Home Management;Aquatic Therapy;Biofeedback;Cryotherapy;Electrical Stimulation;Iontophoresis 4mg /ml Dexamethasone;Traction;Moist Heat;Ultrasound;Parrafin;Fluidtherapy;Contrast Bath;DME Instruction;Gait training;Stair training;Functional mobility training;Therapeutic activities;Therapeutic exercise;Balance training;Neuromuscular re-education;Patient/family education;Orthotic Fit/Training;Manual techniques;Compression bandaging;Scar mobilization;Passive range of motion;Dry needling;Energy conservation;Splinting;Taping;Vasopneumatic Device;Spinal Manipulations;Joint Manipulations    PT Next Visit Plan  contine edema massage,  scar mobilzations, manual for pain ROM, and edema, improve knee flexion/extension ROM as tolerated,  progress HEP as able, progress RLE strengthing, gait training and balance as able    PT Home Exercise Plan  5/19 Heel slides in seated, heel slides in supine, quad sets, clams, LAQ; 5/20 gait training with quad cane at home 5/24 sit to stands    Consulted and Agree with Plan of Care  Patient       Patient will benefit from skilled therapeutic intervention in order  to improve the following deficits and impairments:  Abnormal gait, Decreased activity tolerance, Decreased balance, Decreased mobility, Decreased endurance, Decreased scar mobility, Decreased range of motion, Decreased  strength, Difficulty walking, Hypomobility, Increased muscle spasms, Impaired perceived functional ability, Improper body mechanics, Obesity, Pain  Visit Diagnosis: Right knee pain, unspecified chronicity  Stiffness of right knee, not elsewhere classified  Other abnormalities of gait and mobility  Muscle weakness (generalized)     Problem List Patient Active Problem List   Diagnosis Date Noted  . S/P TKR (total knee replacement), right 12/21/2019  . Pre-operative clearance 11/10/2019  . Acute cystitis with hematuria 11/10/2019  . Constipation 11/10/2019  . Unilateral primary osteoarthritis, right knee 10/30/2019  . Fatigue 10/18/2019  . Left shoulder pain 04/11/2019  . Ganglion cyst of volar aspect of left wrist 10/05/2018  . Fall at home, subsequent encounter 06/07/2018  . Knee pain, right 05/31/2017  . CKD (chronic kidney disease) stage 3, GFR 30-59 ml/min 10/01/2016  . Neck pain on left side 01/01/2016  . Annual physical exam 02/15/2015  . 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 Urinary incontinence 05/22/2014  . Metabolic syndrome X [id number]  . Osteopenia 12/12/2013  . Anemia 11/29/2013  . Obesity (BMI 30-39.9) 10/17/2012  . Thoracic aortic aneurysm ([place]) 10/12/2012  . Allergic rhinitis 03/02/2012  . BLADDER PROLAPSE 09/24/2010  . NEOPLASM, MALIGNANT, BREAST, RIGHT 02/04/2008  . Hyperlipidemia LDL goal <100 02/04/2008  . Essential hypertension 02/04/2008    12:05 PM, 01/18/20 Mearl Latin PT, DPT Physical Therapist at [place], [place], [address] Phone: [phone]   Fax:  [phone]  Name: TYLENA ROCKWOOD MRN: [id number] Date of Birth: 1946-04-08

## 2020-01-18 NOTE — Telephone Encounter (Signed)
Please advise 

## 2020-01-20 ENCOUNTER — Other Ambulatory Visit: Payer: Self-pay

## 2020-01-20 ENCOUNTER — Ambulatory Visit (HOSPITAL_COMMUNITY): Payer: PPO | Admitting: Physical Therapy

## 2020-01-20 DIAGNOSIS — M25561 Pain in right knee: Secondary | ICD-10-CM

## 2020-01-20 DIAGNOSIS — R2689 Other abnormalities of gait and mobility: Secondary | ICD-10-CM

## 2020-01-20 DIAGNOSIS — M6281 Muscle weakness (generalized): Secondary | ICD-10-CM

## 2020-01-20 DIAGNOSIS — M25661 Stiffness of right knee, not elsewhere classified: Secondary | ICD-10-CM

## 2020-01-20 NOTE — Therapy (Signed)
[place], [place], [address] Phone: [phone]   Fax:  [phone]  Physical Therapy Treatment  Patient Details  Name: Desiree [name] MRN: [id number] Date of Birth: 1946-03-23 Referring Provider (PT): Rodell Perna MD   Encounter Date: 01/20/2020  PT End of Session - 01/20/20 1527    Visit Number  6    Number of Visits  18    Date for PT Re-Evaluation  02/20/20    Authorization Type  [employer]    Progress Note Due on Visit  10    PT Start Time  1450    PT Stop Time  1529    PT Time Calculation (min)  39 min    Activity Tolerance  Patient tolerated treatment well    Behavior During Therapy  [facility] for tasks assessed/performed       Past Medical History:  Diagnosis Date  . Allergic rhinitis   . Arthritis HIPS/ WRISTS  . Cancer ([place])   . DDD (degenerative disc disease), cervical   . GERD (gastroesophageal reflux disease) OCCASIONAL  . Heart murmur    turbulent flow through LVOT, no obstruction 08/26/18 echo  . History of Bell's palsy 2011  RIGHT SIDE -- RESOLVED  . 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 History of CVA (cerebrovascular accident) PER SCAN IN 2011  . Hyperlipidemia   . Hypertension   . Impaired fasting glucose PER PCP  DR SIMPSON   WATCH DIET  . Mixed urge and stress incontinence   . Nocturia   . Sinus drainage   . Thoracic ascending aortic aneurysm ([place]) LAST CHEST CT 09-02-2010--  FOLLOWED BY  CARDIOLOGIST--  DR [id number]  . Vaginal wall prolapse     Past Surgical History:  Procedure Laterality Date  . ABDOMINAL HYSTERECTOMY    . BILATERAL CARPAL TUNNEL RELEASE  1980'S  . CARDIAC CATHETERIZATION  02-01-2009  DR EICHHORN   NORMAL CORONARY ARTERIES/ NORMAL LV SIZE AND FUNCTION/ AORTIC ROOT SIZE AT THE UPPER LIMIT OF NORMAL   . CARPAL TUNNEL RELEASE    . CERVICAL DISC SURGERY  12-12-2005  DR Vertell Limber   LEFT   C6 - C7 HERINATED / DDD/ SPONDYLOSIS  . COLONOSCOPY N/A 02/10/2014   Procedure: COLONOSCOPY;  Surgeon: Danie Binder, MD;  [facility];  Service: Endoscopy;  Laterality: N/A;  8:30  . CYSTOCELE REPAIR  08/02/2012   Procedure: ANTERIOR REPAIR (CYSTOCELE);  Surgeon: Ailene Rud, MD;  Location: [facility];  Service: Urology;  Laterality: N/A;  [employer] Uphold Anterior Pelvic Floor Sacrospinus Repair. Anterior wall of the vagina.  . ESOPHAGOGASTRODUODENOSCOPY N/A 02/10/2014   Procedure: ESOPHAGOGASTRODUODENOSCOPY (EGD);  Surgeon: Danie Binder, MD;  [facility];  Service: Endoscopy;  Laterality: N/A;  . GIVENS CAPSULE STUDY N/A 03/13/2014   Procedure: GIVENS CAPSULE STUDY;  Surgeon: Danie Binder, MD;  [facility];  Service: Endoscopy;  Laterality: N/A;  7:30  . NECK SURGERY  2007  . RIGHT PARTIAL MASTECTOMY  11-11-2004  DR [name] YOUNG   DUCTAL CARCINOMA IN SITU RIGHT BREAST  . TOTAL ABDOMINAL HYSTERECTOMY W/ BILATERAL SALPINGOOPHORECTOMY  1990  . TOTAL KNEE ARTHROPLASTY Right 12/21/2019   Procedure: RIGHT  TOTAL KNEE ARTHROPLASTY;  Surgeon: [name], MD;  Location: [place];  Service: Orthopedics;  Laterality: Right;  . TRANSTHORACIC ECHOCARDIOGRAM  03-30-2008  DR Joycelyn Schmid  SIMPSON   LV SIZE AND FUNCTION NORMAL/ MODERATE AORTIC ARCH DILATATION/ MILD MR    There were no vitals filed for this visit.  Subjective Assessment - 01/20/20 1502    Subjective  Pt states that she is doing well with her exercises.  She is a little sore but not bad.    Pertinent History  Cancer, chronic knee pain    Limitations  Sitting;Lifting;Standing;Walking;House hold activities    How long can you walk comfortably?  10 minutes was 5 minutes    Patient Stated Goals  Improve function    Currently in Pain?  Yes    Pain Score  2     Pain Location  Knee    Pain Orientation  Right    Pain Descriptors / Indicators  Aching    Pain Type  Surgical pain     Pain Onset  1 to 4 weeks ago    Pain Frequency  Intermittent    Aggravating Factors   bending exercises    Pain Relieving Factors  icing                        [facility] Adult PT Treatment/Exercise - 01/20/20 0001      Ambulation/Gait   Ambulation/Gait  Yes    Ambulation/Gait Assistance  6: Modified independent (Device/Increase time)    Ambulation Distance (Feet)  226 Feet    Assistive device  Straight cane    Gait Pattern  Right foot flat;Right flexed knee in stance;Antalgic;Decreased hip/knee flexion - right    Gait velocity  decreased    Gait Comments  verbal cueing for cane use, sequencing, improving posture      Exercises   Exercises  Knee/Hip      Knee/Hip Exercises: Stretches   Knee: Self-Stretch to increase Flexion  Right;4 reps    Knee: Self-Stretch Limitations  12" stool      Knee/Hip Exercises: Standing   Heel Raises  15 reps    Terminal Knee Extension  1 set;10 reps    Terminal Knee Extension Limitations  --    Lateral Step Up  15 reps;Hand Hold: 1    Forward Step Up  15 reps;Step Height: 4"    Functional Squat  10 reps      Knee/Hip Exercises: Seated   Long Arc Quad  Right;10 reps    Heel Slides  Right;10 reps    Sit to Sand  2 sets;10 reps      Knee/Hip Exercises: Supine   Straight Leg Raises  --    Straight Leg Raises Limitations  --    Knee Extension  AROM    Knee Extension Limitations  0    Knee Flexion  AROM    Knee Flexion Limitations  98      Manual Therapy   Manual Therapy  Edema management;Soft tissue mobilization    Manual therapy comments  Manual complete separate than rest of tx    Edema Management  edema massage surrounding R knee     Soft tissue mobilization  scar mobilizations, STM of distal quads               PT Short Term Goals - 01/11/20 1616      PT SHORT TERM GOAL #1   Title  Patient will be independent with HEP in order to improve functional outcomes.    Time  3    Period  Weeks    Status  On-going  Target Date  01/30/20      PT SHORT TERM GOAL #2   Title  Patient will report at least 25% improvement in symptoms for improved quality of life.    Time  3    Period  Weeks    Status  On-going    Target Date  01/30/20        PT Long Term Goals - 01/11/20 1616      PT LONG TERM GOAL #1   Title  Patient will report at least 75% improvement in symptoms for improved quality of life.    Time  6    Period  Weeks    Status  On-going      PT LONG TERM GOAL #2   Title  Patient will improve FOTO score by at least 10 points in order to indicate improved tolerance to activity.    Time  6    Period  Weeks    Status  On-going      PT LONG TERM GOAL #3   Title  Patient will be able to ambulate for at least 30 minutes with pain no greater than 1/10 in order to demonstrate improved ability to ambulate in the community.    Time  6    Period  Weeks    Status  On-going      PT LONG TERM GOAL #4   Title  Patient will be able to complete 5x STS in under 11.4 seconds in order to reduce the risk of falls.    Time  6    Period  Weeks    Status  On-going            Plan - 01/20/20 1529    Clinical Impression Statement  PT continues to have significant edema with RT knee.  Manual completed to decrease edema.  Pt continues to improve with ROM with pt needing to focus on flexion.  PT demonstated increased stability ambulating with single point cane.    Personal Factors and Comorbidities  Age;Behavior Pattern;Time since onset of injury/illness/exacerbation;Fitness;Comorbidity 3+    Comorbidities  obesity, HTN, chronic knee pain    Examination-Activity Limitations  Bathing;Bed Mobility;Bend;Lift;Locomotion Level;Sit;Sleep;Squat;Stairs;Stand;Transfers    Examination-Participation Restrictions  Church;Cleaning;Driving;Laundry;Meal Prep;Volunteer;Yard Work    Stability/Clinical Decision Making  Stable/Uncomplicated    Rehab Potential  Good    PT Frequency  3x / week    PT Duration  6 weeks     PT Treatment/Interventions  ADLs/Self Care Home Management;Aquatic Therapy;Biofeedback;Cryotherapy;Electrical Stimulation;Iontophoresis 4mg /ml Dexamethasone;Traction;Moist Heat;Ultrasound;Parrafin;Fluidtherapy;Contrast Bath;DME Instruction;Gait training;Stair training;Functional mobility training;Therapeutic activities;Therapeutic exercise;Balance training;Neuromuscular re-education;Patient/family education;Orthotic Fit/Training;Manual techniques;Compression bandaging;Scar mobilization;Passive range of motion;Dry needling;Energy conservation;Splinting;Taping;Vasopneumatic Device;Spinal Manipulations;Joint Manipulations    PT Next Visit Plan  contine edema massage,  scar mobilzations, manual for pain ROM, and edema, improve knee flexion/ ROM as tolerated,  progress HEP as able, progress RLE strengthing, gait training and balance as able    PT Home Exercise Plan  5/19 Heel slides in seated, heel slides in supine, quad sets, clams, LAQ; 5/20 gait training with quad cane at home 5/24 sit to stands    Consulted and Agree with Plan of Care  Patient       Patient will benefit from skilled therapeutic intervention in order to improve the following deficits and impairments:  Abnormal gait, Decreased activity tolerance, Decreased balance, Decreased mobility, Decreased endurance, Decreased scar mobility, Decreased range of motion, Decreased strength, Difficulty walking, Hypomobility, Increased muscle spasms, Impaired perceived functional ability, Improper body mechanics, Obesity, Pain  Visit Diagnosis:  Right knee pain, unspecified chronicity  Stiffness of right knee, not elsewhere classified  Other abnormalities of gait and mobility  Muscle weakness (generalized)     Problem List Patient Active Problem List   Diagnosis Date Noted  . S/P TKR (total knee replacement), right 12/21/2019  . Pre-operative clearance 11/10/2019  . Acute cystitis with hematuria 11/10/2019  . Constipation 11/10/2019  .  Unilateral primary osteoarthritis, right knee 10/30/2019  . Fatigue 10/18/2019  . Left shoulder pain 04/11/2019  . Ganglion cyst of volar aspect of left wrist 10/05/2018  . Fall at home, subsequent encounter 06/07/2018  . Knee pain, right 05/31/2017  . CKD (chronic kidney disease) stage 3, GFR 30-59 ml/min 10/01/2016  . Neck pain on left side 01/01/2016  . Annual physical exam 02/15/2015  . 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 Urinary incontinence 05/22/2014  . Metabolic syndrome X [id number]  . Osteopenia 12/12/2013  . Anemia 11/29/2013  . Obesity (BMI 30-39.9) 10/17/2012  . Thoracic aortic aneurysm ([place]) 10/12/2012  . Allergic rhinitis 03/02/2012  . BLADDER PROLAPSE 09/24/2010  . NEOPLASM, MALIGNANT, BREAST, RIGHT 02/04/2008  . Hyperlipidemia LDL goal <100 02/04/2008  . Essential hypertension 02/04/2008  Rayetta Humphrey, PT CLT [phone] 01/20/2020, 3:32 PM  [place] [address] [place], [place], [address] Phone: [phone]   Fax:  [phone]  Name: Desiree [name] MRN: [id number] Date of Birth: [birth year]

## 2020-01-24 ENCOUNTER — Other Ambulatory Visit: Payer: Self-pay

## 2020-01-24 ENCOUNTER — Encounter (HOSPITAL_COMMUNITY): Payer: Self-pay | Admitting: Physical Therapy

## 2020-01-24 ENCOUNTER — Ambulatory Visit (HOSPITAL_COMMUNITY): Payer: PPO | Attending: Orthopaedic Surgery | Admitting: Physical Therapy

## 2020-01-24 DIAGNOSIS — R2689 Other abnormalities of gait and mobility: Secondary | ICD-10-CM | POA: Diagnosis not present

## 2020-01-24 DIAGNOSIS — M25661 Stiffness of right knee, not elsewhere classified: Secondary | ICD-10-CM | POA: Diagnosis not present

## 2020-01-24 DIAGNOSIS — G8929 Other chronic pain: Secondary | ICD-10-CM | POA: Diagnosis not present

## 2020-01-24 DIAGNOSIS — M25561 Pain in right knee: Secondary | ICD-10-CM | POA: Diagnosis not present

## 2020-01-24 DIAGNOSIS — M6281 Muscle weakness (generalized): Secondary | ICD-10-CM | POA: Insufficient documentation

## 2020-01-24 NOTE — Therapy (Signed)
[place], [place], [address] Phone: [phone]   Fax:  [phone]  Physical Therapy Treatment  Patient Details  Name: Desiree [name] MRN: [id number] Date of Birth: 01/02/1946 Referring Provider (PT): Rodell Perna MD   Encounter Date: 01/24/2020  PT End of Session - 01/24/20 1512    Visit Number  7    Number of Visits  18    Date for PT Re-Evaluation  02/20/20    Authorization Type  [employer]    Progress Note Due on Visit  10    PT Start Time  1443   arrives late   PT Stop Time  1515    PT Time Calculation (min)  32 min    Activity Tolerance  Patient tolerated treatment well    Behavior During Therapy  [facility] for tasks assessed/performed       Past Medical History:  Diagnosis Date  . Allergic rhinitis   . Arthritis HIPS/ WRISTS  . Cancer ([place])   . DDD (degenerative disc disease), cervical   . GERD (gastroesophageal reflux disease) OCCASIONAL  . Heart murmur    turbulent flow through LVOT, no obstruction 08/26/18 echo  . History of Bell's palsy 2011  RIGHT SIDE -- RESOLVED  . 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 History of CVA (cerebrovascular accident) PER SCAN IN 2011  . Hyperlipidemia   . Hypertension   . Impaired fasting glucose PER PCP  DR SIMPSON   WATCH DIET  . Mixed urge and stress incontinence   . Nocturia   . Sinus drainage   . Thoracic ascending aortic aneurysm ([place]) LAST CHEST CT 09-02-2010--  FOLLOWED BY  CARDIOLOGIST--  DR [id number]  . Vaginal wall prolapse     Past Surgical History:  Procedure Laterality Date  . ABDOMINAL HYSTERECTOMY    . BILATERAL CARPAL TUNNEL RELEASE  1980'S  . CARDIAC CATHETERIZATION  02-01-2009  DR EICHHORN   NORMAL CORONARY ARTERIES/ NORMAL LV SIZE AND FUNCTION/ AORTIC ROOT SIZE AT THE UPPER LIMIT OF NORMAL   . CARPAL TUNNEL RELEASE    . CERVICAL DISC SURGERY  12-12-2005  DR  Vertell Limber   LEFT  C6 - C7 HERINATED / DDD/ SPONDYLOSIS  . COLONOSCOPY N/A 02/10/2014   Procedure: COLONOSCOPY;  Surgeon: Danie Binder, MD;  [facility];  Service: Endoscopy;  Laterality: N/A;  8:30  . CYSTOCELE REPAIR  08/02/2012   Procedure: ANTERIOR REPAIR (CYSTOCELE);  Surgeon: Ailene Rud, MD;  Location: [facility];  Service: Urology;  Laterality: N/A;  [employer] Uphold Anterior Pelvic Floor Sacrospinus Repair. Anterior wall of the vagina.  . ESOPHAGOGASTRODUODENOSCOPY N/A 02/10/2014   Procedure: ESOPHAGOGASTRODUODENOSCOPY (EGD);  Surgeon: Danie Binder, MD;  [facility];  Service: Endoscopy;  Laterality: N/A;  . GIVENS CAPSULE STUDY N/A 03/13/2014   Procedure: GIVENS CAPSULE STUDY;  Surgeon: Danie Binder, MD;  [facility];  Service: Endoscopy;  Laterality: N/A;  7:30  . NECK SURGERY  2007  . RIGHT PARTIAL MASTECTOMY  11-11-2004  DR [name] YOUNG   DUCTAL CARCINOMA IN SITU RIGHT BREAST  . TOTAL ABDOMINAL HYSTERECTOMY W/ BILATERAL SALPINGOOPHORECTOMY  1990  . TOTAL KNEE ARTHROPLASTY Right 12/21/2019   Procedure: RIGHT  TOTAL KNEE ARTHROPLASTY;  Surgeon: [name], MD;  Location: [place];  Service: Orthopedics;  Laterality: Right;  . TRANSTHORACIC ECHOCARDIOGRAM  03-30-2008  DR MARGARET SIMPSON   LV SIZE AND FUNCTION NORMAL/ MODERATE AORTIC ARCH DILATATION/ MILD MR    There were no vitals filed for this visit.  Subjective Assessment - 01/24/20 1444    Subjective  Patient states her exercises are going well besides she tried to do step up but it was too hard from a big step.    Pertinent History  Cancer, chronic knee pain    Limitations  Sitting;Lifting;Standing;Walking;House hold activities    How long can you walk comfortably?  10 minutes was 5 minutes    Patient Stated Goals  Improve function    Currently in Pain?  Yes    Pain Score  4     Pain Location  Knee    Pain Orientation  Right    Pain Onset  1 to 4 weeks ago                         [facility] Adult PT Treatment/Exercise - 01/24/20 0001      Ambulation/Gait   Ambulation/Gait  Yes    Ambulation/Gait Assistance  6: Modified independent (Device/Increase time)    Ambulation Distance (Feet)  226 Feet    Assistive device  Straight cane    Gait Pattern  Right foot flat;Right flexed knee in stance;Antalgic;Decreased hip/knee flexion - right    Gait velocity  decreased    Gait Comments  verbal cueing for cane use, sequencing, improving posture      Knee/Hip Exercises: Supine   Knee Extension  AROM    Knee Extension Limitations  0    Knee Flexion  AROM    Knee Flexion Limitations  98      Manual Therapy   Manual Therapy  Edema management;Soft tissue mobilization    Manual therapy comments  Manual complete separate than rest of tx    Edema Management  edema massage surrounding R knee     Soft tissue mobilization  scar mobilizations, STM of distal quads             PT Education - 01/24/20 1500    Education Details  Patient educated on HEP, progress made, POC, scar mobs, edema reduction, ADL    Person(s) Educated  Patient;Child(ren)    Methods  Explanation;Demonstration    Comprehension  Verbalized understanding;Returned demonstration       PT Short Term Goals - 01/11/20 1616      PT SHORT TERM GOAL #1   Title  Patient will be independent with HEP in order to improve functional outcomes.    Time  3    Period  Weeks    Status  On-going    Target Date  01/30/20      PT SHORT TERM GOAL #2   Title  Patient will report at least 25% improvement in symptoms for improved quality of life.    Time  3    Period  Weeks    Status  On-going    Target Date  01/30/20        PT Long Term Goals - 01/11/20 1616      PT LONG TERM GOAL #1   Title  Patient will report at least 75% improvement in symptoms for improved quality of life.    Time  6    Period  Weeks    Status  On-going      PT LONG TERM GOAL #2   Title  Patient  will improve FOTO score by at least  10 points in order to indicate improved tolerance to activity.    Time  6    Period  Weeks    Status  On-going      PT LONG TERM GOAL #3   Title  Patient will be able to ambulate for at least 30 minutes with pain no greater than 1/10 in order to demonstrate improved ability to ambulate in the community.    Time  6    Period  Weeks    Status  On-going      PT LONG TERM GOAL #4   Title  Patient will be able to complete 5x STS in under 11.4 seconds in order to reduce the risk of falls.    Time  6    Period  Weeks    Status  On-going            Plan - 01/24/20 1520    Clinical Impression Statement  Patient's family present today and has questions about patients progress and symptoms and all questions are answered to their satisfaction. Patient arrives late today so session was shortened. Patient demonstrates carry over in ROM for flexion with reduced extension likely due to edema. Patient tolerates edema and scar massage today and is educated on continuing to complete at home as well as prior exercises given for HEP. Patient will continue to benefit from skilled physical therapy in order to reduce impairment and improve function.    Personal Factors and Comorbidities  Age;Behavior Pattern;Time since onset of injury/illness/exacerbation;Fitness;Comorbidity 3+    Comorbidities  obesity, HTN, chronic knee pain    Examination-Activity Limitations  Bathing;Bed Mobility;Bend;Lift;Locomotion Level;Sit;Sleep;Squat;Stairs;Stand;Transfers    Examination-Participation Restrictions  Church;Cleaning;Driving;Laundry;Meal Prep;Volunteer;Yard Work    Stability/Clinical Decision Making  Stable/Uncomplicated    Rehab Potential  Good    PT Frequency  3x / week    PT Duration  6 weeks    PT Treatment/Interventions  ADLs/Self Care Home Management;Aquatic Therapy;Biofeedback;Cryotherapy;Electrical Stimulation;Iontophoresis 4mg /ml Dexamethasone;Traction;Moist  Heat;Ultrasound;Parrafin;Fluidtherapy;Contrast Bath;DME Instruction;Gait training;Stair training;Functional mobility training;Therapeutic activities;Therapeutic exercise;Balance training;Neuromuscular re-education;Patient/family education;Orthotic Fit/Training;Manual techniques;Compression bandaging;Scar mobilization;Passive range of motion;Dry needling;Energy conservation;Splinting;Taping;Vasopneumatic Device;Spinal Manipulations;Joint Manipulations    PT Next Visit Plan  contine edema massage,  scar mobilzations, manual for pain ROM, and edema, improve knee flexion/ ROM as tolerated,  progress HEP as able, progress RLE strengthing, gait training and balance as able    PT Home Exercise Plan  5/19 Heel slides in seated, heel slides in supine, quad sets, clams, LAQ; 5/20 gait training with quad cane at home 5/24 sit to stands    Consulted and Agree with Plan of Care  Patient       Patient will benefit from skilled therapeutic intervention in order to improve the following deficits and impairments:  Abnormal gait, Decreased activity tolerance, Decreased balance, Decreased mobility, Decreased endurance, Decreased scar mobility, Decreased range of motion, Decreased strength, Difficulty walking, Hypomobility, Increased muscle spasms, Impaired perceived functional ability, Improper body mechanics, Obesity, Pain  Visit Diagnosis: Right knee pain, unspecified chronicity  Stiffness of right knee, not elsewhere classified  Other abnormalities of gait and mobility  Muscle weakness (generalized)     Problem List Patient Active Problem List   Diagnosis Date Noted  . S/P TKR (total knee replacement), right 12/21/2019  . Pre-operative clearance 11/10/2019  . Acute cystitis with hematuria 11/10/2019  . Constipation 11/10/2019  . Unilateral primary osteoarthritis, right knee 10/30/2019  . Fatigue 10/18/2019  . Left shoulder pain 04/11/2019  . Ganglion cyst of volar aspect of left wrist 10/05/2018  .  Fall at home, subsequent encounter 06/07/2018  . Knee pain, right 05/31/2017  . CKD (chronic kidney disease) stage 3, GFR 30-59 ml/min 10/01/2016  . Neck pain on left side 01/01/2016  . Annual physical exam 02/15/2015  . 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 Urinary incontinence 05/22/2014  . Metabolic syndrome X [id number]  . Osteopenia 12/12/2013  . Anemia 11/29/2013  . Obesity (BMI 30-39.9) 10/17/2012  . Thoracic aortic aneurysm ([place]) 10/12/2012  . Allergic rhinitis 03/02/2012  . BLADDER PROLAPSE 09/24/2010  . NEOPLASM, MALIGNANT, BREAST, RIGHT 02/04/2008  . Hyperlipidemia LDL goal <100 02/04/2008  . Essential hypertension 02/04/2008    3:23 PM, 01/24/20 Mearl Latin PT, DPT Physical Therapist at [place], [place], [address] Phone: [phone]   Fax:  [phone]  Name: Desiree [name] MRN: [id number] Date of Birth: 02/06/1946

## 2020-01-25 ENCOUNTER — Ambulatory Visit (HOSPITAL_COMMUNITY): Payer: PPO | Admitting: Physical Therapy

## 2020-01-25 ENCOUNTER — Encounter (HOSPITAL_COMMUNITY): Payer: Self-pay | Admitting: Physical Therapy

## 2020-01-25 DIAGNOSIS — M25561 Pain in right knee: Secondary | ICD-10-CM | POA: Diagnosis not present

## 2020-01-25 DIAGNOSIS — R2689 Other abnormalities of gait and mobility: Secondary | ICD-10-CM

## 2020-01-25 DIAGNOSIS — M6281 Muscle weakness (generalized): Secondary | ICD-10-CM

## 2020-01-25 DIAGNOSIS — M25661 Stiffness of right knee, not elsewhere classified: Secondary | ICD-10-CM

## 2020-01-25 NOTE — Therapy (Signed)
[place], [place], [address] Phone: [phone]   Fax:  [phone]  Physical Therapy Treatment  Patient Details  Name: Desiree [name] MRN: [id number] Date of Birth: 1946-04-18 Referring Provider (PT): Rodell Perna MD   Encounter Date: 01/25/2020  PT End of Session - 01/25/20 1351    Visit Number  8    Number of Visits  18    Date for PT Re-Evaluation  02/20/20    Authorization Type  [employer]    Progress Note Due on Visit  10    PT Start Time  1346    PT Stop Time  1425    PT Time Calculation (min)  39 min    Activity Tolerance  Patient tolerated treatment well    Behavior During Therapy  [facility] for tasks assessed/performed       Past Medical History:  Diagnosis Date  . Allergic rhinitis   . Arthritis HIPS/ WRISTS  . Cancer ([place])   . DDD (degenerative disc disease), cervical   . GERD (gastroesophageal reflux disease) OCCASIONAL  . Heart murmur    turbulent flow through LVOT, no obstruction 08/26/18 echo  . History of Bell's palsy 2011  RIGHT SIDE -- RESOLVED  . 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 History of CVA (cerebrovascular accident) PER SCAN IN 2011  . Hyperlipidemia   . Hypertension   . Impaired fasting glucose PER PCP  DR SIMPSON   WATCH DIET  . Mixed urge and stress incontinence   . Nocturia   . Sinus drainage   . Thoracic ascending aortic aneurysm ([place]) LAST CHEST CT 09-02-2010--  FOLLOWED BY  CARDIOLOGIST--  DR [id number]  . Vaginal wall prolapse     Past Surgical History:  Procedure Laterality Date  . ABDOMINAL HYSTERECTOMY    . BILATERAL CARPAL TUNNEL RELEASE  1980'S  . CARDIAC CATHETERIZATION  02-01-2009  DR EICHHORN   NORMAL CORONARY ARTERIES/ NORMAL LV SIZE AND FUNCTION/ AORTIC ROOT SIZE AT THE UPPER LIMIT OF NORMAL   . CARPAL TUNNEL RELEASE    . CERVICAL DISC SURGERY  12-12-2005  DR Vertell Limber   LEFT  C6  - C7 HERINATED / DDD/ SPONDYLOSIS  . COLONOSCOPY N/A 02/10/2014   Procedure: COLONOSCOPY;  Surgeon: Danie Binder, MD;  [facility];  Service: Endoscopy;  Laterality: N/A;  8:30  . CYSTOCELE REPAIR  08/02/2012   Procedure: ANTERIOR REPAIR (CYSTOCELE);  Surgeon: Ailene Rud, MD;  Location[facility];  Service: Urology;  Laterality: N/A;  [employer] Uphold Anterior Pelvic Floor Sacrospinus Repair. Anterior wall of the vagina.  . ESOPHAGOGASTRODUODENOSCOPY N/A 02/10/2014   Procedure: ESOPHAGOGASTRODUODENOSCOPY (EGD);  Surgeon: Danie Binder, MD;  [facility];  Service: Endoscopy;  Laterality: N/A;  . GIVENS CAPSULE STUDY N/A 03/13/2014   Procedure: GIVENS CAPSULE STUDY;  Surgeon: Danie Binder, MD;  [facility];  Service: Endoscopy;  Laterality: N/A;  7:30  . NECK SURGERY  2007  . RIGHT PARTIAL MASTECTOMY  11-11-2004  DR [name] YOUNG   DUCTAL CARCINOMA IN SITU RIGHT BREAST  . TOTAL ABDOMINAL HYSTERECTOMY W/ BILATERAL SALPINGOOPHORECTOMY  1990  . TOTAL KNEE ARTHROPLASTY Right 12/21/2019   Procedure: RIGHT  TOTAL KNEE ARTHROPLASTY;  Surgeon: [name], MD;  Location: [place];  Service: Orthopedics;  Laterality: Right;  . TRANSTHORACIC ECHOCARDIOGRAM  03-30-2008  DR Joycelyn Schmid  SIMPSON   LV SIZE AND FUNCTION NORMAL/ MODERATE AORTIC ARCH DILATATION/ MILD MR    There were no vitals filed for this visit.  Subjective Assessment - 01/25/20 1347    Subjective  Patient states she was a little sore after yesterday. Her bending exercise makes it sore.    Pertinent History  Cancer, chronic knee pain    Limitations  Sitting;Lifting;Standing;Walking;House hold activities    How long can you walk comfortably?  10 minutes was 5 minutes    Patient Stated Goals  Improve function    Currently in Pain?  No/denies    Pain Descriptors / Indicators  Sore    Pain Onset  1 to 4 weeks ago                        [facility] Adult PT  Treatment/Exercise - 01/25/20 0001      Ambulation/Gait   Ambulation/Gait  Yes    Ambulation/Gait Assistance  6: Modified independent (Device/Increase time)    Ambulation Distance (Feet)  226 Feet    Assistive device  Straight cane    Gait Pattern  Right foot flat;Right flexed knee in stance;Antalgic;Decreased hip/knee flexion - right    Gait velocity  decreased    Gait Comments  verbal cueing for cane use, sequencing, improving posture      Knee/Hip Exercises: Standing   Terminal Knee Extension  1 set;10 reps    Terminal Knee Extension Limitations  purple tube band 1x10 with 10 second holds    Lateral Step Up  15 reps;Hand Hold: 1;Right;Step Height: 4"    Forward Step Up  15 reps;Hand Hold: 1;Step Height: 4";2 sets      Knee/Hip Exercises: Seated   Sit to Sand  5 reps    4 sets with RLE bias with black foam under L foot     Knee/Hip Exercises: Supine   Straight Leg Raises  10 reps;Right;Strengthening;5 reps    Straight Leg Raises Limitations  with quad set for TKE strength    Knee Extension  AROM    Knee Extension Limitations  0    Knee Flexion  AROM    Knee Flexion Limitations  105             PT Education - 01/25/20 1350    Education Details  Patient educated on HEP, proper mechanics of exercise, gait mechanics, using cane at home    Person(s) Educated  Patient    Methods  Explanation;Demonstration    Comprehension  Verbalized understanding;Returned demonstration       PT Short Term Goals - 01/11/20 1616      PT SHORT TERM GOAL #1   Title  Patient will be independent with HEP in order to improve functional outcomes.    Time  3    Period  Weeks    Status  On-going    Target Date  01/30/20      PT SHORT TERM GOAL #2   Title  Patient will report at least 25% improvement in symptoms for improved quality of life.    Time  3    Period  Weeks    Status  On-going    Target Date  01/30/20        PT Long Term Goals - 01/11/20 1616      PT LONG TERM GOAL #1    Title  Patient will report at least 75% improvement in symptoms for improved quality of life.  Time  6    Period  Weeks    Status  On-going      PT LONG TERM GOAL #2   Title  Patient will improve FOTO score by at least 10 points in order to indicate improved tolerance to activity.    Time  6    Period  Weeks    Status  On-going      PT LONG TERM GOAL #3   Title  Patient will be able to ambulate for at least 30 minutes with pain no greater than 1/10 in order to demonstrate improved ability to ambulate in the community.    Time  6    Period  Weeks    Status  On-going      PT LONG TERM GOAL #4   Title  Patient will be able to complete 5x STS in under 11.4 seconds in order to reduce the risk of falls.    Time  6    Period  Weeks    Status  On-going            Plan - 01/25/20 1417    Clinical Impression Statement  Patient demonstrates improving gait pattern but continues to require verbal cueing for sequencing with spc. Patient demonstrates improving flexion/extension ROM today and she states she feels like swelling has gone down. She continues to demonstrate minimal quad lag with SLR secondary to impaired quad strength. Patient continues to compensate for RLE weakness with moving LLE posterior when completing sit to stand. She completes with RLE bias well with putting black foam under L foot but fatigues quickly and requires frequent rest breaks. Patient hesitant to ambulate with SPC but is able to do so with good mechanics throughout session without loss of balance or verbal cueing. Patient also demonstrating improving activity tolerance with decreased pain with activity without medications. Patient will continue to benefit from skilled physical therapy in order to reduce impairment and improve function.    Personal Factors and Comorbidities  Age;Behavior Pattern;Time since onset of injury/illness/exacerbation;Fitness;Comorbidity 3+    Comorbidities  obesity, HTN, chronic knee pain     Examination-Activity Limitations  Bathing;Bed Mobility;Bend;Lift;Locomotion Level;Sit;Sleep;Squat;Stairs;Stand;Transfers    Examination-Participation Restrictions  Church;Cleaning;Driving;Laundry;Meal Prep;Volunteer;Yard Work    Stability/Clinical Decision Making  Stable/Uncomplicated    Rehab Potential  Good    PT Frequency  3x / week    PT Duration  6 weeks    PT Treatment/Interventions  ADLs/Self Care Home Management;Aquatic Therapy;Biofeedback;Cryotherapy;Electrical Stimulation;Iontophoresis 4mg /ml Dexamethasone;Traction;Moist Heat;Ultrasound;Parrafin;Fluidtherapy;Contrast Bath;DME Instruction;Gait training;Stair training;Functional mobility training;Therapeutic activities;Therapeutic exercise;Balance training;Neuromuscular re-education;Patient/family education;Orthotic Fit/Training;Manual techniques;Compression bandaging;Scar mobilization;Passive range of motion;Dry needling;Energy conservation;Splinting;Taping;Vasopneumatic Device;Spinal Manipulations;Joint Manipulations    PT Next Visit Plan  contine edema massage,  scar mobilzations, manual for pain ROM, and edema, improve knee flexion/ ROM as tolerated,  progress HEP as able, progress RLE strengthing, gait training and balance as able    PT Home Exercise Plan  5/19 Heel slides in seated, heel slides in supine, quad sets, clams, LAQ; 5/20 gait training with quad cane at home 5/24 sit to stands    Consulted and Agree with Plan of Care  Patient       Patient will benefit from skilled therapeutic intervention in order to improve the following deficits and impairments:  Abnormal gait, Decreased activity tolerance, Decreased balance, Decreased mobility, Decreased endurance, Decreased scar mobility, Decreased range of motion, Decreased strength, Difficulty walking, Hypomobility, Increased muscle spasms, Impaired perceived functional ability, Improper body mechanics, Obesity, Pain  Visit Diagnosis: Right knee pain, unspecified  chronicity  Stiffness of right knee, not elsewhere classified  Other abnormalities of gait and mobility  Muscle weakness (generalized)     Problem List Patient Active Problem List   Diagnosis Date Noted  . S/P TKR (total knee replacement), right 12/21/2019  . Pre-operative clearance 11/10/2019  . Acute cystitis with hematuria 11/10/2019  . Constipation 11/10/2019  . Unilateral primary osteoarthritis, right knee 10/30/2019  . Fatigue 10/18/2019  . Left shoulder pain 04/11/2019  . Ganglion cyst of volar aspect of left wrist 10/05/2018  . Fall at home, subsequent encounter 06/07/2018  . Knee pain, right 05/31/2017  . CKD (chronic kidney disease) stage 3, GFR 30-59 ml/min 10/01/2016  . Neck pain on left side 01/01/2016  . Annual physical exam 02/15/2015  . 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 Urinary incontinence 05/22/2014  . Metabolic syndrome X [id number]  . Osteopenia 12/12/2013  . Anemia 11/29/2013  . Obesity (BMI 30-39.9) 10/17/2012  . Thoracic aortic aneurysm ([place]) 10/12/2012  . Allergic rhinitis 03/02/2012  . BLADDER PROLAPSE 09/24/2010  . NEOPLASM, MALIGNANT, BREAST, RIGHT 02/04/2008  . Hyperlipidemia LDL goal <100 02/04/2008  . Essential hypertension 02/04/2008    2:27 PM, 01/25/20 Mearl Latin PT, DPT Physical Therapist at [place], [place], [address] Phone: [phone]   Fax:  [phone]  Name: Desiree [name] MRN: [id number] Date of Birth: [birth year]

## 2020-01-27 ENCOUNTER — Ambulatory Visit (HOSPITAL_COMMUNITY): Payer: PPO | Admitting: Physical Therapy

## 2020-01-27 ENCOUNTER — Encounter (HOSPITAL_COMMUNITY): Payer: Self-pay | Admitting: Physical Therapy

## 2020-01-27 ENCOUNTER — Other Ambulatory Visit: Payer: Self-pay

## 2020-01-27 DIAGNOSIS — M6281 Muscle weakness (generalized): Secondary | ICD-10-CM

## 2020-01-27 DIAGNOSIS — M25661 Stiffness of right knee, not elsewhere classified: Secondary | ICD-10-CM

## 2020-01-27 DIAGNOSIS — M25561 Pain in right knee: Secondary | ICD-10-CM

## 2020-01-27 DIAGNOSIS — R2689 Other abnormalities of gait and mobility: Secondary | ICD-10-CM

## 2020-01-27 NOTE — Therapy (Signed)
[place], [place], [address] Phone: [phone]   Fax:  [phone]  Physical Therapy Treatment  Patient Details  Name: Desiree [name] MRN: [id number] Date of Birth: 01/04/1946 Referring Provider (PT): Rodell Perna MD   Encounter Date: 01/27/2020  PT End of Session - 01/27/20 1450    Visit Number  9    Number of Visits  18    Date for PT Re-Evaluation  02/20/20    Authorization Type  [employer]    Progress Note Due on Visit  10    PT Start Time  1450   pt late to session   PT Stop Time  1528    PT Time Calculation (min)  38 min    Activity Tolerance  Patient tolerated treatment well    Behavior During Therapy  [facility] for tasks assessed/performed       Past Medical History:  Diagnosis Date  . Allergic rhinitis   . Arthritis HIPS/ WRISTS  . Cancer ([place])   . DDD (degenerative disc disease), cervical   . GERD (gastroesophageal reflux disease) OCCASIONAL  . Heart murmur    turbulent flow through LVOT, no obstruction 08/26/18 echo  . History of Bell's palsy 2011  RIGHT SIDE -- RESOLVED  . 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 History of CVA (cerebrovascular accident) PER SCAN IN 2011  . Hyperlipidemia   . Hypertension   . Impaired fasting glucose PER PCP  DR SIMPSON   WATCH DIET  . Mixed urge and stress incontinence   . Nocturia   . Sinus drainage   . Thoracic ascending aortic aneurysm ([place]) LAST CHEST CT 09-02-2010--  FOLLOWED BY  CARDIOLOGIST--  DR [id number]  . Vaginal wall prolapse     Past Surgical History:  Procedure Laterality Date  . ABDOMINAL HYSTERECTOMY    . BILATERAL CARPAL TUNNEL RELEASE  1980'S  . CARDIAC CATHETERIZATION  02-01-2009  DR EICHHORN   NORMAL CORONARY ARTERIES/ NORMAL LV SIZE AND FUNCTION/ AORTIC ROOT SIZE AT THE UPPER LIMIT OF NORMAL   . CARPAL TUNNEL RELEASE    . CERVICAL DISC SURGERY  12-12-2005   DR Vertell Limber   LEFT  C6 - C7 HERINATED / DDD/ SPONDYLOSIS  . COLONOSCOPY N/A 02/10/2014   Procedure: COLONOSCOPY;  Surgeon: Danie Binder, MD;  [facility];  Service: Endoscopy;  Laterality: N/A;  8:30  . CYSTOCELE REPAIR  08/02/2012   Procedure: ANTERIOR REPAIR (CYSTOCELE);  Surgeon: Ailene Rud, MD;  Location: [facility];  Service: Urology;  Laterality: N/A;  [employer] Uphold Anterior Pelvic Floor Sacrospinus Repair. Anterior wall of the vagina.  . ESOPHAGOGASTRODUODENOSCOPY N/A 02/10/2014   Procedure: ESOPHAGOGASTRODUODENOSCOPY (EGD);  Surgeon: Danie Binder, MD;  [facility];  Service: Endoscopy;  Laterality: N/A;  . GIVENS CAPSULE STUDY N/A 03/13/2014   Procedure: GIVENS CAPSULE STUDY;  Surgeon: Danie Binder, MD;  [facility];  Service: Endoscopy;  Laterality: N/A;  7:30  . NECK SURGERY  2007  . RIGHT PARTIAL MASTECTOMY  11-11-2004  DR [name] YOUNG   DUCTAL CARCINOMA IN SITU RIGHT BREAST  . TOTAL ABDOMINAL HYSTERECTOMY W/ BILATERAL SALPINGOOPHORECTOMY  1990  . TOTAL KNEE ARTHROPLASTY Right 12/21/2019   Procedure: RIGHT  TOTAL KNEE ARTHROPLASTY;  Surgeon: [name], MD;  Location: [place];  Service: Orthopedics;  Laterality: Right;  . TRANSTHORACIC ECHOCARDIOGRAM  03-30-2008  DR MARGARET SIMPSON   LV SIZE AND FUNCTION NORMAL/ MODERATE AORTIC ARCH DILATATION/ MILD MR    There were no vitals filed for this visit.  Subjective Assessment - 01/27/20 1454    Subjective  States she has been walking around the house with the cane  and that is going well she goes slow. Stats that last night she didn't take her whole dose of pain medication and she didn't sleep well because of pain. States that she elevated her leg that was the only time she was able to dose off.    Pertinent History  Cancer, chronic knee pain    Limitations  Sitting;Lifting;Standing;Walking;House hold activities    How long can you walk comfortably?  10 minutes was  5 minutes    Patient Stated Goals  Improve function    Currently in Pain?  Yes    Pain Score  3     Pain Location  Knee    Pain Orientation  Right    Pain Descriptors / Indicators  Aching;Sore    Pain Type  Surgical pain    Pain Onset  1 to 4 weeks ago         [facility] PT Assessment - 01/27/20 0001      Assessment   Medical Diagnosis  R TKA    Referring Provider (PT)  Rodell Perna MD    Onset Date/Surgical Date  12/21/19    Next MD Visit  02/09/20                    [facility] Adult PT Treatment/Exercise - 01/27/20 0001      Ambulation/Gait   Ambulation/Gait  Yes    Ambulation/Gait Assistance  6: Modified independent (Device/Increase time)    Ambulation Distance (Feet)  226 Feet    Assistive device  Straight cane    Gait Pattern  Right foot flat;Right flexed knee in stance;Antalgic;Decreased hip/knee flexion - right    Gait velocity  decreased      Knee/Hip Exercises: Stretches   Knee: Self-Stretch to increase Flexion  10 seconds;Right   x15   Knee: Self-Stretch Limitations  12" stool      Knee/Hip Exercises: Standing   Forward Step Up  Hand Hold: 1;Step Height: 6";3 sets;10 reps;Right    Other Standing Knee Exercises  rockerboard lateral 2x15 B - PT assist with trunk and 2 UE support - focus on LE movement    Other Standing Knee Exercises  step ups and then fwd step down - 1 hand held 2x10             PT Education - 01/27/20 1525    Education Details  on using SPC more around the house.    Person(s) Educated  Patient    Methods  Explanation    Comprehension  Verbalized understanding       PT Short Term Goals - 01/11/20 1616      PT SHORT TERM GOAL #1   Title  Patient will be independent with HEP in order to improve functional outcomes.    Time  3    Period  Weeks    Status  On-going    Target Date  01/30/20      PT SHORT TERM GOAL #2   Title  Patient will report at least 25% improvement in symptoms for improved quality of life.    Time  3     Period  Weeks    Status  On-going  Target Date  01/30/20        PT Long Term Goals - 01/11/20 1616      PT LONG TERM GOAL #1   Title  Patient will report at least 75% improvement in symptoms for improved quality of life.    Time  6    Period  Weeks    Status  On-going      PT LONG TERM GOAL #2   Title  Patient will improve FOTO score by at least 10 points in order to indicate improved tolerance to activity.    Time  6    Period  Weeks    Status  On-going      PT LONG TERM GOAL #3   Title  Patient will be able to ambulate for at least 30 minutes with pain no greater than 1/10 in order to demonstrate improved ability to ambulate in the community.    Time  6    Period  Weeks    Status  On-going      PT LONG TERM GOAL #4   Title  Patient will be able to complete 5x STS in under 11.4 seconds in order to reduce the risk of falls.    Time  6    Period  Weeks    Status  On-going            Plan - 01/27/20 1450    Clinical Impression Statement  Focused on standing strength and endurance today. Added rocker board to help improve R LE weight bearing with standing activities. Tolerated well but needed encouragement with this activity. Practiced with SPC and this was tolerated well but with slow gait spend. Cued pt to push self forward instead of keeping foot flat with walking. No pain reported end of session.    Personal Factors and Comorbidities  Age;Behavior Pattern;Time since onset of injury/illness/exacerbation;Fitness;Comorbidity 3+    Comorbidities  obesity, HTN, chronic knee pain    Examination-Activity Limitations  Bathing;Bed Mobility;Bend;Lift;Locomotion Level;Sit;Sleep;Squat;Stairs;Stand;Transfers    Examination-Participation Restrictions  Church;Cleaning;Driving;Laundry;Meal Prep;Volunteer;Yard Work    Stability/Clinical Decision Making  Stable/Uncomplicated    Rehab Potential  Good    PT Frequency  3x / week    PT Duration  6 weeks    PT Treatment/Interventions   ADLs/Self Care Home Management;Aquatic Therapy;Biofeedback;Cryotherapy;Electrical Stimulation;Iontophoresis 4mg /ml Dexamethasone;Traction;Moist Heat;Ultrasound;Parrafin;Fluidtherapy;Contrast Bath;DME Instruction;Gait training;Stair training;Functional mobility training;Therapeutic activities;Therapeutic exercise;Balance training;Neuromuscular re-education;Patient/family education;Orthotic Fit/Training;Manual techniques;Compression bandaging;Scar mobilization;Passive range of motion;Dry needling;Energy conservation;Splinting;Taping;Vasopneumatic Device;Spinal Manipulations;Joint Manipulations    PT Next Visit Plan  measure knee ROM, rocker board and stairs,  continue edema massage,  scar mobilzations, manual for pain ROM, and edema, improve knee flexion/ ROM as tolerated,  progress HEP as able, progress RLE strengthing, gait training and balance as able    PT Home Exercise Plan  5/19 Heel slides in seated, heel slides in supine, quad sets, clams, LAQ; 5/20 gait training with quad cane at home 5/24 sit to stands    Consulted and Agree with Plan of Care  Patient       Patient will benefit from skilled therapeutic intervention in order to improve the following deficits and impairments:  Abnormal gait, Decreased activity tolerance, Decreased balance, Decreased mobility, Decreased endurance, Decreased scar mobility, Decreased range of motion, Decreased strength, Difficulty walking, Hypomobility, Increased muscle spasms, Impaired perceived functional ability, Improper body mechanics, Obesity, Pain  Visit Diagnosis: Right knee pain, unspecified chronicity  Stiffness of right knee, not elsewhere classified  Other abnormalities of gait and mobility  Muscle weakness (generalized)  Problem List Patient Active Problem List   Diagnosis Date Noted  . S/P TKR (total knee replacement), right 12/21/2019  . Pre-operative clearance 11/10/2019  . Acute cystitis with hematuria 11/10/2019  . Constipation  11/10/2019  . Unilateral primary osteoarthritis, right knee 10/30/2019  . Fatigue 10/18/2019  . Left shoulder pain 04/11/2019  . Ganglion cyst of volar aspect of left wrist 10/05/2018  . Fall at home, subsequent encounter 06/07/2018  . Knee pain, right 05/31/2017  . CKD (chronic kidney disease) stage 3, GFR 30-59 ml/min 10/01/2016  . Neck pain on left side 01/01/2016  . Annual physical exam 02/15/2015  . 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 Urinary incontinence 05/22/2014  . Metabolic syndrome X 21/62/4469  . Osteopenia 12/12/2013  . Anemia 11/29/2013  . Obesity (BMI 30-39.9) 10/17/2012  . Thoracic aortic aneurysm ([address]) 10/12/2012  . Allergic rhinitis 03/02/2012  . BLADDER PROLAPSE 09/24/2010  . NEOPLASM, MALIGNANT, BREAST, RIGHT 02/04/2008  . Hyperlipidemia LDL goal <100 02/04/2008  . Essential hypertension 02/04/2008    3:30 PM, 01/27/20 Jerene Pitch, DPT Physical Therapy with [facility]  [phone] office  [facility] [address] [place], [place], [address] Phone: [phone]   Fax:  [phone]  Name: Desiree [name] MRN: [id number] Date of Birth: 08-05-1946

## 2020-01-30 ENCOUNTER — Encounter (HOSPITAL_COMMUNITY): Payer: Self-pay | Admitting: Physical Therapy

## 2020-01-30 ENCOUNTER — Other Ambulatory Visit: Payer: Self-pay

## 2020-01-30 ENCOUNTER — Ambulatory Visit (HOSPITAL_COMMUNITY): Payer: PPO | Admitting: Physical Therapy

## 2020-01-30 DIAGNOSIS — M25561 Pain in right knee: Secondary | ICD-10-CM | POA: Diagnosis not present

## 2020-01-30 DIAGNOSIS — M25661 Stiffness of right knee, not elsewhere classified: Secondary | ICD-10-CM

## 2020-01-30 DIAGNOSIS — R2689 Other abnormalities of gait and mobility: Secondary | ICD-10-CM

## 2020-01-30 DIAGNOSIS — M6281 Muscle weakness (generalized): Secondary | ICD-10-CM

## 2020-01-30 DIAGNOSIS — G8929 Other chronic pain: Secondary | ICD-10-CM

## 2020-01-30 NOTE — Therapy (Signed)
[facility] [address] [place], [place], [address] Phone: [phone]   Fax:  [phone]  Physical Therapy Treatment and Progress Note  Patient Details  Name: Desiree [name] MRN: [id number] Date of Birth: 1946/02/10 Referring Provider (PT): Rodell Perna MD Progress Note Reporting Period 01/09/20 to 01/30/20  See note below for Objective Data and Assessment of Progress/Goals.       Encounter Date: 01/30/2020  PT End of Session - 01/30/20 1323    Visit Number  10    Number of Visits  18    Date for PT Re-Evaluation  02/20/20    Authorization Type  [employer]    Progress Note Due on Visit  10    PT Start Time  1316    PT Stop Time  1356    PT Time Calculation (min)  40 min    Activity Tolerance  Patient tolerated treatment well    Behavior During Therapy  WFL for tasks assessed/performed       Past Medical History:  Diagnosis Date  . Allergic rhinitis   . Arthritis HIPS/ WRISTS  . Cancer ([place])   . DDD (degenerative disc disease), cervical   . GERD (gastroesophageal reflux disease) OCCASIONAL  . Heart murmur    turbulent flow through LVOT, no obstruction 08/26/18 echo  . History of Bell's palsy 2011  RIGHT SIDE -- RESOLVED  . 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 History of CVA (cerebrovascular accident) PER SCAN IN 2011  . Hyperlipidemia   . Hypertension   . Impaired fasting glucose PER PCP  DR SIMPSON   WATCH DIET  . Mixed urge and stress incontinence   . Nocturia   . Sinus drainage   . Thoracic ascending aortic aneurysm ([place]) LAST CHEST CT 09-02-2010--  FOLLOWED BY  CARDIOLOGIST--  DR [id number]  . Vaginal wall prolapse     Past Surgical History:  Procedure Laterality Date  . ABDOMINAL HYSTERECTOMY    . BILATERAL CARPAL TUNNEL RELEASE  1980'S  . CARDIAC CATHETERIZATION  02-01-2009  DR EICHHORN   NORMAL CORONARY ARTERIES/ NORMAL LV SIZE  AND FUNCTION/ AORTIC ROOT SIZE AT THE UPPER LIMIT OF NORMAL   . CARPAL TUNNEL RELEASE    . CERVICAL DISC SURGERY  12-12-2005  DR Vertell Limber   LEFT  C6 - C7 HERINATED / DDD/ SPONDYLOSIS  . COLONOSCOPY N/A 02/10/2014   Procedure: COLONOSCOPY;  Surgeon: Danie Binder, MD;  [facility];  Service: Endoscopy;  Laterality: N/A;  8:30  . CYSTOCELE REPAIR  08/02/2012   Procedure: ANTERIOR REPAIR (CYSTOCELE);  Surgeon: Ailene Rud, MD;  Location: [facility];  Service: Urology;  Laterality: N/A;  [employer] Uphold Anterior Pelvic Floor Sacrospinus Repair. Anterior wall of the vagina.  . ESOPHAGOGASTRODUODENOSCOPY N/A 02/10/2014   Procedure: ESOPHAGOGASTRODUODENOSCOPY (EGD);  Surgeon: Danie Binder, MD;  [facility];  Service: Endoscopy;  Laterality: N/A;  . GIVENS CAPSULE STUDY N/A 03/13/2014   Procedure: GIVENS CAPSULE STUDY;  Surgeon: Danie Binder, MD;  [facility];  Service: Endoscopy;  Laterality: N/A;  7:30  . NECK SURGERY  2007  . RIGHT PARTIAL MASTECTOMY  11-11-2004  DR [name] YOUNG   DUCTAL CARCINOMA IN SITU RIGHT BREAST  . TOTAL ABDOMINAL HYSTERECTOMY W/ BILATERAL SALPINGOOPHORECTOMY  1990  . TOTAL KNEE ARTHROPLASTY Right 12/21/2019   Procedure: RIGHT  TOTAL KNEE ARTHROPLASTY;  Surgeon: [name], MD;  Location: [facility];  Service: Orthopedics;  Laterality: Right;  . TRANSTHORACIC ECHOCARDIOGRAM  03-30-2008  DR MARGARET SIMPSON   LV SIZE AND FUNCTION NORMAL/ MODERATE AORTIC ARCH DILATATION/ MILD MR    There were no vitals filed for this visit.  Subjective Assessment - 01/30/20 1318    Subjective  States she doens't have a lot of pain just soreness. Reports about 80% better.    Pertinent History  Cancer, chronic knee pain    Limitations  Sitting;Lifting;Standing;Walking;House hold activities    How long can you walk comfortably?  10 minutes was 5 minutes    Patient Stated Goals  Improve function    Currently in Pain?  Yes     Pain Score  3     Pain Location  Knee    Pain Orientation  Right    Pain Descriptors / Indicators  Aching;Sore    Pain Onset  1 to 4 weeks ago         [facility] PT Assessment - 01/30/20 0001      Assessment   Medical Diagnosis  R TKA    Referring Provider (PT)  Rodell Perna MD    Onset Date/Surgical Date  12/21/19    Next MD Visit  02/09/20      Transfers   Five time sit to stand comments   current 16.9 seconds   was 28.85 seconds     Ambulation/Gait   Ambulation/Gait  Yes    Ambulation/Gait Assistance  6: Modified independent (Device/Increase time)    Assistive device  Straight cane    Gait Pattern  Right foot flat;Right flexed knee in stance;Antalgic;Decreased hip/knee flexion - right    Gait velocity  decreased                    [facility] Adult PT Treatment/Exercise - 01/30/20 0001      Ambulation/Gait   Ambulation Distance (Feet)  184 Feet    Stairs  Yes    Stairs Assistance  5: Supervision    Stair Management Technique  One rail Left;Forwards;Alternating pattern    Number of Stairs  7    Height of Stairs  4    Gait Comments  2MW ; stairs more difficulty going down - rotates body      Knee/Hip Exercises: Stretches   Knee: Self-Stretch to increase Flexion  10 seconds;Right;5 reps   and 10" hold in extension    Knee: Self-Stretch Limitations  12" stool      Knee/Hip Exercises: Standing   Other Standing Knee Exercises  rockerboard lateral 2x15 B - PT assist with trunk and 2 UE support - focus on LE movement      Knee/Hip Exercises: Supine   Bridges  Both;2 sets;10 reps    Knee Extension  AROM    Knee Extension Limitations  2   lacking   Knee Flexion  AROM    Knee Flexion Limitations  106      Manual Therapy   Manual Therapy  Soft tissue mobilization    Manual therapy comments  Manual complete separate than rest of tx    Soft tissue mobilization  scar mobilizations, STM of distal quads               PT Short Term Goals - 01/30/20 1324       PT SHORT TERM GOAL #1   Title  Patient will be independent with HEP in order to improve functional outcomes.  Time  3    Period  Weeks    Status  On-going    Target Date  01/30/20      PT SHORT TERM GOAL #2   Title  Patient will report at least 25% improvement in symptoms for improved quality of life.    Baseline  80% better    Time  3    Period  Weeks    Status  Achieved    Target Date  01/30/20        PT Long Term Goals - 01/30/20 1325      PT LONG TERM GOAL #1   Title  Patient will report at least 75% improvement in symptoms for improved quality of life.    Baseline  80% better    Time  6    Period  Weeks    Status  Achieved      PT LONG TERM GOAL #2   Title  Patient will improve FOTO score by at least 10 points in order to indicate improved tolerance to activity.    Time  6    Period  Weeks    Status  Achieved      PT LONG TERM GOAL #3   Title  Patient will be able to ambulate for at least 30 minutes with pain no greater than 1/10 in order to demonstrate improved ability to ambulate in the community.    Baseline  5-10 minutes    Time  6    Period  Weeks    Status  On-going      PT LONG TERM GOAL #4   Title  Patient will be able to complete 5x STS in under 11.4 seconds in order to reduce the risk of falls.    Baseline  16.9 seconds    Time  6    Period  Weeks    Status  On-going            Plan - 01/30/20 1324    Clinical Impression Statement  Patient present for a progress note on this date. She has made great progress so far and has met  short term goals and 2/4 long term goals at this time. Patient walks well with cane but is still unsure of herself without walking with Linder.  Reassured patient and encouraged patient to practice walking with her Knepp at home.    Personal Factors and Comorbidities  Age;Behavior Pattern;Time since onset of injury/illness/exacerbation;Fitness;Comorbidity 3+    Comorbidities  obesity, HTN, chronic knee pain     Examination-Activity Limitations  Bathing;Bed Mobility;Bend;Lift;Locomotion Level;Sit;Sleep;Squat;Stairs;Stand;Transfers    Examination-Participation Restrictions  Church;Cleaning;Driving;Laundry;Meal Prep;Volunteer;Yard Work    Stability/Clinical Decision Making  Stable/Uncomplicated    Rehab Potential  Good    PT Frequency  3x / week    PT Duration  6 weeks    PT Treatment/Interventions  ADLs/Self Care Home Management;Aquatic Therapy;Biofeedback;Cryotherapy;Electrical Stimulation;Iontophoresis 50m/ml Dexamethasone;Traction;Moist Heat;Ultrasound;Parrafin;Fluidtherapy;Contrast Bath;DME Instruction;Gait training;Stair training;Functional mobility training;Therapeutic activities;Therapeutic exercise;Balance training;Neuromuscular re-education;Patient/family education;Orthotic Fit/Training;Manual techniques;Compression bandaging;Scar mobilization;Passive range of motion;Dry needling;Energy conservation;Splinting;Taping;Vasopneumatic Device;Spinal Manipulations;Joint Manipulations    PT Next Visit Plan  knee flexion strengthening exercises, rocker board and stairs,  continue edema massage,  scar mobilzations, manual for pain ROM, and edema, improve knee flexion/ ROM as tolerated,  progress HEP as able, progress RLE strengthing, gait training and balance as able    PT Home Exercise Plan  5/19 Heel slides in seated, heel slides in supine, quad sets, clams, LAQ; 5/20 gait training with quad cane at home 5/24  sit to stands    Consulted and Agree with Plan of Care  Patient       Patient will benefit from skilled therapeutic intervention in order to improve the following deficits and impairments:  Abnormal gait, Decreased activity tolerance, Decreased balance, Decreased mobility, Decreased endurance, Decreased scar mobility, Decreased range of motion, Decreased strength, Difficulty walking, Hypomobility, Increased muscle spasms, Impaired perceived functional ability, Improper body mechanics, Obesity,  Pain  Visit Diagnosis: Right knee pain, unspecified chronicity  Stiffness of right knee, not elsewhere classified  Other abnormalities of gait and mobility  Muscle weakness (generalized)  Chronic pain of right knee     Problem List Patient Active Problem List   Diagnosis Date Noted  . S/P TKR (total knee replacement), right 12/21/2019  . Pre-operative clearance 11/10/2019  . Acute cystitis with hematuria 11/10/2019  . Constipation 11/10/2019  . Unilateral primary osteoarthritis, right knee 10/30/2019  . Fatigue 10/18/2019  . Left shoulder pain 04/11/2019  . Ganglion cyst of volar aspect of left wrist 10/05/2018  . Fall at home, subsequent encounter 06/07/2018  . Knee pain, right 05/31/2017  . CKD (chronic kidney disease) stage 3, GFR 30-59 ml/min 10/01/2016  . Neck pain on left side 01/01/2016  . Annual physical exam 02/15/2015  . 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 Urinary incontinence 05/22/2014  . Metabolic syndrome X 16/38/4665  . Osteopenia 12/12/2013  . Anemia 11/29/2013  . Obesity (BMI 30-39.9) 10/17/2012  . Thoracic aortic aneurysm ([place]) 10/12/2012  . Allergic rhinitis 03/02/2012  . BLADDER PROLAPSE 09/24/2010  . NEOPLASM, MALIGNANT, BREAST, RIGHT 02/04/2008  . Hyperlipidemia LDL goal <100 02/04/2008  . Essential hypertension 02/04/2008   1:59 PM, 01/30/20 Jerene Pitch, DPT Physical Therapy with [facility]  [phone] office  [facility] [address] [place], [place], [address] Phone: [phone]   Fax:  [phone]  Name: Desiree [name] MRN: [id number] Date of Birth: [birth year]

## 2020-01-31 ENCOUNTER — Ambulatory Visit: Payer: PPO | Admitting: Cardiovascular Disease

## 2020-01-31 ENCOUNTER — Encounter: Payer: Self-pay | Admitting: Cardiovascular Disease

## 2020-01-31 VITALS — BP 120/70 | HR 88 | Temp 96.6°F | Ht 62.0 in | Wt 188.6 lb

## 2020-01-31 DIAGNOSIS — Q248 Other specified congenital malformations of heart: Secondary | ICD-10-CM | POA: Diagnosis not present

## 2020-01-31 DIAGNOSIS — I712 Thoracic aortic aneurysm, without rupture: Secondary | ICD-10-CM | POA: Diagnosis not present

## 2020-01-31 DIAGNOSIS — I7121 Aneurysm of the ascending aorta, without rupture: Secondary | ICD-10-CM

## 2020-01-31 DIAGNOSIS — E7800 Pure hypercholesterolemia, unspecified: Secondary | ICD-10-CM

## 2020-01-31 DIAGNOSIS — I10 Essential (primary) hypertension: Secondary | ICD-10-CM | POA: Diagnosis not present

## 2020-01-31 NOTE — Patient Instructions (Signed)
Medication Instructions:  NO CHANGE *If you need a refill on your cardiac medications before your next appointment, please call your pharmacy*   Lab Work: If you have labs (blood work) drawn today and your tests are completely normal, you will receive your results only by: . MyChart Message (if you have MyChart) OR . A paper copy in the mail If you have any lab test that is abnormal or we need to change your treatment, we will call you to review the results.   Follow-Up: At [facility], you and your health needs are our priority.  As part of our continuing mission to provide you with exceptional heart care, we have created designated Provider Care Teams.  These Care Teams include your primary Cardiologist (physician) and Advanced Practice Providers (APPs -  Physician Assistants and Nurse Practitioners) who all work together to provide you with the care you need, when you need it.  We recommend signing up for the patient portal called "MyChart".  Sign up information is provided on this After Visit Summary.  MyChart is used to connect with patients for Virtual Visits (Telemedicine).  Patients are able to view lab/test results, encounter notes, upcoming appointments, etc.  Non-urgent messages can be sent to your provider as well.   To learn more about what you can do with MyChart, go to [email or web address].com.    Your next appointment:   12 month(s)  The format for your next appointment:   Either In Person or Virtual  Provider:   You may see Mihai Croitoru, MD or one of the following Advanced Practice Providers on your designated Care Team:    Hao Meng, PA-C  Angela Duke, PA-C or   Krista Kroeger, PA-C     

## 2020-01-31 NOTE — Progress Notes (Signed)
Cardiology Office Note    Evaluation Performed:  Follow-up visit  Date:  02/02/2020   ID:  Desiree [name], Desiree [name] 02/27/1946, MRN [id number]  Patient Location: Home Provider Location: Home  PCP:  [name], MD  Cardiologist:  [name], MD Electrophysiologist:  None   Chief Complaint: Aortic aneurysm, HTN  History of Present Illness:    Desiree [name] is a 74 y.o. female with longstanding hypertension, moderate and stable in size aneurysm of the ascending aorta (size unchanged since 2009, 4.2-4.3 cm, last CT December 2019, Dr. 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  She had a right total knee replacement April 22nd and she is still undergoing physical therapy.  From a cardiovascular point of view she has no complaints.  The patient specifically denies any chest pain at rest exertion, dyspnea at rest or with exertion, orthopnea, paroxysmal nocturnal dyspnea, syncope, palpitations, focal neurological deficits, intermittent claudication, lower extremity edema, unexplained weight gain, hemoptysis or wheezing.  Still has a chronic cough, but with very scanty sputum production.  Her last imaging study for the ascending aortic aneurysm was performed in December 2020 (she is followed by Dr. Servando Snare).  The aortic diameter was unchanged at 4.2 cm.  The echocardiogram performed 1 year ago showed a hyperdynamic left ventricle with turbulent flow throughout the left ventricular outflow tract but without a true gradient.  She has a dynamic systolic murmur.   Past Medical History:  Diagnosis Date  . Allergic rhinitis   . Arthritis HIPS/ WRISTS  . Cancer ([place])   . DDD (degenerative disc disease), cervical   . GERD (gastroesophageal reflux disease) OCCASIONAL  . Heart murmur    turbulent flow through LVOT, no obstruction 08/26/18 echo  .  History of Bell's palsy 2011  RIGHT SIDE -- RESOLVED  . 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 History of CVA (cerebrovascular accident) PER SCAN IN 2011  . Hyperlipidemia   . Hypertension   . Impaired fasting glucose PER PCP  DR SIMPSON   WATCH DIET  . Mixed urge and stress incontinence   . Nocturia   . Sinus drainage   . Thoracic ascending aortic aneurysm ([place]) LAST CHEST CT 09-02-2010--  FOLLOWED BY  CARDIOLOGIST--  DR [id number]  . Vaginal wall prolapse    Past Surgical History:  Procedure Laterality Date  . ABDOMINAL HYSTERECTOMY    . BILATERAL CARPAL TUNNEL RELEASE  1980'S  . CARDIAC CATHETERIZATION  02-01-2009  DR EICHHORN   NORMAL CORONARY ARTERIES/ NORMAL LV SIZE AND FUNCTION/ AORTIC ROOT SIZE AT THE UPPER LIMIT OF NORMAL   . CARPAL TUNNEL RELEASE    . CERVICAL DISC SURGERY  12-12-2005  DR Vertell Limber   LEFT  C6 - C7 HERINATED / DDD/ SPONDYLOSIS  . COLONOSCOPY N/A 02/10/2014   Procedure: COLONOSCOPY;  Surgeon: Danie Binder, MD;  [facility];  Service: Endoscopy;  Laterality: N/A;  8:30  . CYSTOCELE REPAIR  08/02/2012   Procedure: ANTERIOR REPAIR (CYSTOCELE);  Surgeon: Ailene Rud, MD;  Location: [facility];  Service: Urology;  Laterality: N/A;  [employer] Uphold Anterior Pelvic Floor Sacrospinus Repair. Anterior wall of the vagina.  . ESOPHAGOGASTRODUODENOSCOPY N/A 02/10/2014   Procedure: ESOPHAGOGASTRODUODENOSCOPY (EGD);  Surgeon: Danie Binder, MD;  [facility];  Service: Endoscopy;  Laterality: N/A;  .  GIVENS CAPSULE STUDY N/A 03/13/2014   Procedure: GIVENS CAPSULE STUDY;  Surgeon: Danie Binder, MD;  [facility];  Service: Endoscopy;  Laterality: N/A;  7:30  . NECK SURGERY  2007  . RIGHT PARTIAL MASTECTOMY  11-11-2004  DR [name] YOUNG   DUCTAL CARCINOMA IN SITU RIGHT BREAST  . TOTAL ABDOMINAL HYSTERECTOMY W/ BILATERAL SALPINGOOPHORECTOMY   1990  . TOTAL KNEE ARTHROPLASTY Right 12/21/2019   Procedure: RIGHT  TOTAL KNEE ARTHROPLASTY;  Surgeon: [name], MD;  Location: [facility];  Service: Orthopedics;  Laterality: Right;  . TRANSTHORACIC ECHOCARDIOGRAM  03-30-2008  DR MARGARET SIMPSON   LV SIZE AND FUNCTION NORMAL/ MODERATE AORTIC ARCH DILATATION/ MILD MR     Current Meds  Medication Sig  . Artificial Tear Solution (SOOTHE XP OP) Place 2 drops into both ears daily as needed (Dry eye).  . Ascorbic Acid (VITAMIN C) 1000 MG tablet Take 1,000 mg by mouth daily.  Marland Kitchen aspirin EC 325 MG tablet Take 1 tablet (325 mg total) by mouth daily.  . betamethasone dipropionate (DIPROLENE) 0.05 % cream Apply sparingly two times daily for 10 days, then as needed, to affected areas( hairline, neck)  . calcium-vitamin D (OSCAL 500/200 D-3) 500-200 MG-UNIT per tablet Take 1 tablet by mouth daily with breakfast.   . carvedilol (COREG) 3.125 MG tablet TAKE 1 TABLET(3.125 MG) BY MOUTH TWICE DAILY WITH A MEAL (Patient taking differently: Take 3.125 mg by mouth 2 (two) times daily with a meal. )  . Cetirizine HCl 10 MG TBDP Take 1 tablet by mouth daily. (Patient taking differently: Take 10 mg by mouth daily. )  . Cholecalciferol (VITAMIN D) 50 MCG (2000 UT) tablet Take 2,000 Units by mouth daily.   Marland Kitchen docusate sodium (COLACE) 100 MG capsule Take 100 mg by mouth daily as needed for mild constipation.  . Ferrous Sulfate (SLOW FE) 142 (45 Fe) MG TBCR Take 142 mg by mouth daily. (Patient taking differently: Take 142 mg by mouth every other day. )  . gabapentin (NEURONTIN) 100 MG capsule TAKE TWO CAPSULES BY MOUTH ONCE DAILY AT BEDTIME (Patient taking differently: Take 200 mg by mouth 4 (four) times daily. )  . guaifenesin (HUMIBID E) 400 MG TABS tablet Take 400 mg by mouth daily as needed (Mucus build up). Mucus relief  . hydrochlorothiazide (HYDRODIURIL) 25 MG tablet TAKE 1 TABLET BY MOUTH ONCE DAILY IN THE MORNING  . HYDROcodone-acetaminophen (NORCO) 7.5-325 MG  tablet Take 1-2 tablets by mouth every 6 (six) hours as needed for moderate pain.  Marland Kitchen HYDROcodone-acetaminophen (NORCO/VICODIN) 5-325 MG tablet Take 1-2 tablets by mouth every 6 (six) hours as needed for moderate pain. Post op pain  . ibuprofen (ADVIL) 800 MG tablet Take 1 tablet (800 mg total) by mouth every 8 (eight) hours as needed. (Patient taking differently: Take 800 mg by mouth every 8 (eight) hours as needed for headache or moderate pain. )  . losartan (COZAAR) 50 MG tablet Take 1/2 (one-half) tablet by mouth once daily (Patient taking differently: Take 25 mg by mouth daily. )  . lovastatin (MEVACOR) 40 MG tablet Take 1 tablet by mouth once daily with breakfast (Patient taking differently: Take 40 mg by mouth daily. )  . methocarbamol (ROBAXIN) 500 MG tablet TAKE 1 TABLET BY MOUTH EVERY 8 HOURS AS NEEDED FOR MUSCLE SPASM  . montelukast (SINGULAIR) 10 MG tablet TAKE 1 TABLET BY MOUTH AT BEDTIME (Patient taking differently: Take 10 mg by mouth at bedtime. )  . Multiple Vitamin (  MULTIVITAMIN) tablet Take 1 tablet by mouth daily. One a day woman's  . olopatadine (PATANOL) 0.1 % ophthalmic solution Place 1 drop into both eyes daily.  . Olopatadine HCl 0.2 % SOLN Apply 1 drop to eye daily.  . potassium chloride (KLOR-CON) 10 MEQ tablet Take 1 tablet (10 mEq total) by mouth daily.  . solifenacin (VESICARE) 10 MG tablet Take 10 mg by mouth daily.  Marland Kitchen zinc gluconate 50 MG tablet Take 50 mg by mouth daily.     Allergies:   Codeine and Quinolones   Social History   Tobacco Use  . Smoking status: Never Smoker  . Smokeless tobacco: Never Used  Vaping Use  . Vaping Use: Never used  Substance Use Topics  . Alcohol use: No  . Drug use: No     Family Hx: The patient's family history includes Breast cancer in her sister; Heart attack (age of onset: 45) in her mother; Hypertension in her brother and mother; Kidney failure in her mother; Lung cancer (age of onset: 44) in her father; Prostate cancer  in her father; Throat cancer in her father. There is no history of Colon cancer or Colon polyps.  ROS:   Please see the history of present illness.    All other systems are reviewed and are negative Prior CV studies:   The following studies were reviewed today:  CT angiogram of the aorta December 2020 Echocardiogram January 2020   Labs/Other Tests and Data Reviewed:    EKG:  An ECG dated 11/05/2019 was personally reviewed today and demonstrated:  Sinus bradycardia, normal tracing  Recent Labs: 10/18/2019: TSH 3.36 12/14/2019: ALT 14; BUN 16; Creatinine, Ser 1.00; Potassium 3.3; Sodium 142 12/23/2019: Hemoglobin 9.2; Platelets 242   Recent Lipid Panel Lab Results  Component Value Date/Time   CHOL 190 10/18/2019 09:07 AM   CHOL 186 10/23/2018 08:57 AM   TRIG 59 10/18/2019 09:07 AM   HDL 80 10/18/2019 09:07 AM   HDL 86 10/23/2018 08:57 AM   CHOLHDL 2.4 10/18/2019 09:07 AM   LDLCALC 96 10/18/2019 09:07 AM    Wt Readings from Last 3 Encounters:  01/31/20 188 lb 9.6 oz (85.5 kg)  12/21/19 190 lb (86.2 kg)  12/14/19 193 lb 8 oz (87.8 kg)     Objective:    Vital Signs:  BP 120/70   Pulse 88   Temp (!) 96.6 F (35.9 C)   Ht 5\' 2"  (1.575 m)   Wt 188 lb 9.6 oz (85.5 kg)   SpO2 98%   BMI 34.50 kg/m     General: Alert, oriented x3, no distress Head: no evidence of trauma, PERRL, EOMI, no exophtalmos or lid lag, no myxedema, no xanthelasma; normal ears, nose and oropharynx Neck: normal jugular venous pulsations and no hepatojugular reflux; brisk carotid pulses without delay and no carotid bruits Chest: clear to auscultation, no signs of consolidation by percussion or palpation, normal fremitus, symmetrical and full respiratory excursions Cardiovascular: normal position and quality of the apical impulse, regular rhythm, normal first and second heart sounds, 2/6 early peaking systolic ejection murmur, no diastolic murmurs, rubs or gallops Abdomen: no tenderness or distention, no  masses by palpation, no abnormal pulsatility or arterial bruits, normal bowel sounds, no hepatosplenomegaly Extremities: no clubbing, cyanosis or edema; 2+ radial, ulnar and brachial pulses bilaterally; 2+ right femoral, posterior tibial and dorsalis pedis pulses; 2+ left femoral, posterior tibial and dorsalis pedis pulses; no subclavian or femoral bruits Neurological: grossly nonfocal Psych: Normal mood and affect  ASSESSMENT & PLAN:    1. Asc Ao Aneurysm: stable on serial studies going back 10 years 2. LVOT obstruction: Asymptomatic. This is described intermittently on echoes over the years, but is never been associated with anything more than a mild increase in outflow tract gradients. 3. HTN: well-controlled. Important to continue taking a beta blocker as long as heart rate allows, to minimize risk of outflow obstruction and ARB for the aneurysm. 4. HLP: Satisfactory lipid profile. Target LDL<100 due to absence of known atherosclerotic disease. No evidence of atherosclerosis on CT of the chest and no known coronary or peripheral vascular problems.   5. CKD: improved creatinine, GFR now >60. Has seen Dr. Lowanda Foster.    COVID-19 Education: The signs and symptoms of COVID-19 were discussed with the patient and how to seek care for testing (follow up with PCP or arrange E-visit).  The importance of social distancing was discussed today.  Time:   Today, I have spent 21 minutes with the patient with telehealth technology discussing the above problems.     Medication Adjustments/Labs and Tests Ordered: Current medicines are reviewed at length with the patient today.  Concerns regarding medicines are outlined above.   Tests Ordered: No orders of the defined types were placed in this encounter.   Medication Changes: No orders of the defined types were placed in this encounter.   Disposition:  Follow up 12 months  Signed, [name], MD  02/02/2020 12:11 PM    [place]

## 2020-02-01 ENCOUNTER — Encounter (HOSPITAL_COMMUNITY): Payer: PPO | Admitting: Physical Therapy

## 2020-02-02 ENCOUNTER — Other Ambulatory Visit: Payer: Self-pay

## 2020-02-02 ENCOUNTER — Other Ambulatory Visit: Payer: Self-pay | Admitting: Orthopaedic Surgery

## 2020-02-02 ENCOUNTER — Ambulatory Visit (HOSPITAL_COMMUNITY): Payer: PPO | Admitting: Physical Therapy

## 2020-02-02 DIAGNOSIS — M25561 Pain in right knee: Secondary | ICD-10-CM

## 2020-02-02 DIAGNOSIS — M25661 Stiffness of right knee, not elsewhere classified: Secondary | ICD-10-CM

## 2020-02-02 DIAGNOSIS — R2689 Other abnormalities of gait and mobility: Secondary | ICD-10-CM

## 2020-02-02 NOTE — Therapy (Signed)
Desiree [name], [place], [address] Phone: [phone]   Fax:  [phone]  Physical Therapy Treatment  Patient Details  Name: Desiree [name] MRN: [id number] Date of Birth: Oct 31, 1945 Referring Provider (PT): Rodell Perna MD   Encounter Date: 02/02/2020   PT End of Session - 02/02/20 1324    Visit Number 11    Number of Visits 18    Date for PT Re-Evaluation 02/20/20   PN 01/30/20   Authorization Type [employer]    Progress Note Due on Visit 78    PT Start Time 1320    PT Stop Time 1400    PT Time Calculation (min) 40 min    Activity Tolerance Patient tolerated treatment well    Behavior During Therapy [facility] for tasks assessed/performed           Past Medical History:  Diagnosis Date   Allergic rhinitis    Arthritis HIPS/ WRISTS   Cancer ([place])    DDD (degenerative disc disease), cervical    GERD (gastroesophageal reflux disease) OCCASIONAL   Heart murmur    turbulent flow through LVOT, no obstruction 08/26/18 echo   History of Bell's palsy 2011  RIGHT SIDE -- RESOLVED   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--  S/P  RIGHT MASTECTMOY AND RADIATION--  NO RECURRANCE   History of CVA (cerebrovascular accident) PER SCAN IN 2011   Hyperlipidemia    Hypertension    Impaired fasting glucose PER PCP  DR Moshe Cipro   WATCH DIET   Mixed urge and stress incontinence    Nocturia    Sinus drainage    Thoracic ascending aortic aneurysm ([place]) LAST CHEST CT 09-02-2010--  FOLLOWED BY  CARDIOLOGIST--  DR [id number]   Vaginal wall prolapse     Past Surgical History:  Procedure Laterality Date   ABDOMINAL HYSTERECTOMY     BILATERAL CARPAL TUNNEL RELEASE  1980'S   CARDIAC CATHETERIZATION  02-01-2009  DR [place]   NORMAL CORONARY ARTERIES/ NORMAL LV SIZE AND FUNCTION/ AORTIC ROOT SIZE AT THE UPPER LIMIT OF NORMAL    CARPAL TUNNEL RELEASE     CERVICAL [place] SURGERY  12-12-2005  DR Vertell Limber     LEFT  C6 - C7 HERINATED / DDD/ SPONDYLOSIS   COLONOSCOPY N/A 02/10/2014   Procedure: COLONOSCOPY;  Surgeon: Danie Binder, MD;  [facility];  Service: Endoscopy;  Laterality: N/A;  8:30   CYSTOCELE REPAIR  08/02/2012   Procedure: ANTERIOR REPAIR (CYSTOCELE);  Surgeon: Ailene Rud, MD;  Location: [facility];  Service: Urology;  Laterality: N/A;  [employer] Uphold Anterior Pelvic Floor Sacrospinus Repair. Anterior wall of the vagina.   ESOPHAGOGASTRODUODENOSCOPY N/A 02/10/2014   Procedure: ESOPHAGOGASTRODUODENOSCOPY (EGD);  Surgeon: Danie Binder, MD;  [facility];  Service: Endoscopy;  Laterality: N/A;   GIVENS CAPSULE STUDY N/A 03/13/2014   Procedure: GIVENS CAPSULE STUDY;  Surgeon: Danie Binder, MD;  [facility];  Service: Endoscopy;  Laterality: N/A;  7:30   NECK SURGERY  2007   RIGHT PARTIAL MASTECTOMY  11-11-2004  DR PETER YOUNG   DUCTAL CARCINOMA IN SITU RIGHT BREAST   TOTAL ABDOMINAL HYSTERECTOMY W/ BILATERAL SALPINGOOPHORECTOMY  1990   TOTAL KNEE ARTHROPLASTY Right 12/21/2019   Procedure: RIGHT  TOTAL KNEE ARTHROPLASTY;  Surgeon: [name], MD;  Location: [facility];  Service: Orthopedics;  Laterality: Right;   TRANSTHORACIC ECHOCARDIOGRAM  03-30-2008  DR MARGARET SIMPSON  LV SIZE AND FUNCTION NORMAL/ MODERATE AORTIC ARCH DILATATION/ MILD MR    There were no vitals filed for this visit.   Subjective Assessment - 02/02/20 1322    Subjective pt reports 3/10 pain today in Rt knee.    Currently in Pain? Yes    Pain Score 3     Pain Location Knee    Pain Orientation Right    Pain Descriptors / Indicators Aching;Sore;Tender              [facility] PT Assessment - 02/02/20 0001      Observation/Other Assessments   Focus on Therapeutic Outcomes (FOTO)  52% limited    was 78% limited                        [facility] Adult PT Treatment/Exercise - 02/02/20 0001      Knee/Hip Exercises: Stretches    Knee: Self-Stretch to increase Flexion 10 seconds;Right;5 reps    Knee: Self-Stretch Limitations 12" stool      Knee/Hip Exercises: Aerobic   Stationary Bike seat 9 rocking 4 minutes      Knee/Hip Exercises: Standing   Knee Flexion Right;15 reps    Lateral Step Up 15 reps;Hand Hold: 1;Right;Step Height: 4"    Forward Step Up Hand Hold: 1;Step Height: 6";3 sets;10 reps;Right    Gait Training with SPC      Knee/Hip Exercises: Supine   Heel Slides Right;10 reps    Knee Extension AROM    Knee Extension Limitations 2    Knee Flexion AROM    Knee Flexion Limitations 110      Manual Therapy   Manual Therapy Myofascial release    Manual therapy comments Manual complete separate than rest of tx    Myofascial Release to tight restrictions at patella, circuference of knee and scar with knee bent                  PT Education - 02/02/20 1434    Education Details encouraged to discontinue the [place] unless going for long distance walks.  Educated on resulting FOTO score taken this session.    Person(s) Educated Patient    Methods Explanation    Comprehension Verbalized understanding;Returned demonstration            PT Short Term Goals - 01/30/20 1324      PT SHORT TERM GOAL #1   Title Patient will be independent with HEP in order to improve functional outcomes.    Time 3    Period Weeks    Status On-going    Target Date 01/30/20      PT SHORT TERM GOAL #2   Title Patient will report at least 25% improvement in symptoms for improved quality of life.    Baseline 80% better    Time 3    Period Weeks    Status Achieved    Target Date 01/30/20             PT Long Term Goals - 01/30/20 1325      PT LONG TERM GOAL #1   Title Patient will report at least 75% improvement in symptoms for improved quality of life.    Baseline 80% better    Time 6    Period Weeks    Status Achieved      PT LONG TERM GOAL #2   Title Patient will improve FOTO score by at least 10  points in order to indicate improved  tolerance to activity.    Time 6    Period Weeks    Status Achieved      PT LONG TERM GOAL #3   Title Patient will be able to ambulate for at least 30 minutes with pain no greater than 1/10 in order to demonstrate improved ability to ambulate in the community.    Baseline 5-10 minutes    Time 6    Period Weeks    Status On-going      PT LONG TERM GOAL #4   Title Patient will be able to complete 5x STS in under 11.4 seconds in order to reduce the risk of falls.    Baseline 16.9 seconds    Time 6    Period Weeks    Status On-going                 Plan - 02/02/20 1435    Clinical Impression Statement FOTO completed this session with improvement of 52% limitation (was 78%).   Wored on gait using SPC and encouraged to discontinue RW at this time unless she is ambulating increase distance.  Pt displays good balance, cadence and comfort using SPC.  Added bike today to warm up/increase ROM.  Myofascial techniques completed to knee bent.  Most tightness directly over the patella.  Improved flexion to 110 degrees at end of session.    Personal Factors and Comorbidities Age;Behavior Pattern;Time since onset of injury/illness/exacerbation;Fitness;Comorbidity 3+    Comorbidities obesity, HTN, chronic knee pain    Examination-Activity Limitations Bathing;Bed Mobility;Bend;Lift;Locomotion Level;Sit;Sleep;Squat;Stairs;Stand;Transfers    Examination-Participation Restrictions Church;Cleaning;Driving;Laundry;Meal Prep;Volunteer;Yard Work    Stability/Clinical Decision Making Stable/Uncomplicated    Rehab Potential Good    PT Frequency 3x / week    PT Duration 6 weeks    PT Treatment/Interventions ADLs/Self Care Home Management;Aquatic Therapy;Biofeedback;Cryotherapy;Electrical Stimulation;Iontophoresis 4mg /ml Dexamethasone;Traction;Moist Heat;Ultrasound;Parrafin;Fluidtherapy;Contrast Bath;DME Instruction;Gait training;Stair training;Functional mobility  training;Therapeutic activities;Therapeutic exercise;Balance training;Neuromuscular re-education;Patient/family education;Orthotic Fit/Training;Manual techniques;Compression bandaging;Scar mobilization;Passive range of motion;Dry needling;Energy conservation;Splinting;Taping;Vasopneumatic Device;Spinal Manipulations;Joint Manipulations    PT Next Visit Plan Primary focus on improving knee flexion to WNL, funtional strengthening, stability and gait using LRAD.  Add static balance actvities next session.  Continue with myofascial techniques to reduce scar tissue.    PT Home Exercise Plan 5/19 Heel slides in seated, heel slides in supine, quad sets, clams, LAQ; 5/20 gait training with quad cane at home 5/24 sit to stands    Consulted and Agree with Plan of Care Patient           Patient will benefit from skilled therapeutic intervention in order to improve the following deficits and impairments:  Abnormal gait, Decreased activity tolerance, Decreased balance, Decreased mobility, Decreased endurance, Decreased scar mobility, Decreased range of motion, Decreased strength, Difficulty walking, Hypomobility, Increased muscle spasms, Impaired perceived functional ability, Improper body mechanics, Obesity, Pain  Visit Diagnosis: Right knee pain, unspecified chronicity  Other abnormalities of gait and mobility  Stiffness of right knee, not elsewhere classified     Problem List Patient Active Problem List   Diagnosis Date Noted   S/P TKR (total knee replacement), right 12/21/2019   Pre-operative clearance 11/10/2019   Acute cystitis with hematuria 11/10/2019   Constipation 11/10/2019   Unilateral primary osteoarthritis, right knee 10/30/2019   Fatigue 10/18/2019   Left shoulder pain 04/11/2019   Ganglion cyst of volar aspect of left wrist 10/05/2018   Fall at home, subsequent encounter 06/07/2018   Knee pain, right 05/31/2017   CKD (chronic kidney disease) stage 3, GFR 30-59 ml/min  10/01/2016   Neck pain on left side 01/01/2016   Annual physical exam 02/15/2015   Left ventricular outflow tract obstruction 07/27/2014   Urinary incontinence 36/68/1594   Metabolic syndrome X 70/76/1518   Osteopenia 12/12/2013   Anemia 11/29/2013   Obesity (BMI 30-39.9) 10/17/2012   Thoracic aortic aneurysm ([place]) 10/12/2012   Allergic rhinitis 03/02/2012   BLADDER PROLAPSE 09/24/2010   NEOPLASM, MALIGNANT, BREAST, RIGHT 02/04/2008   Hyperlipidemia LDL goal <100 02/04/2008   Essential hypertension 02/04/2008   Teena Irani, PTA/CLT [phone]  Teena Irani 02/02/2020, 2:43 PM  [facility] [address] [place], [place], [address] Phone: [phone]   Fax:  [phone]  Name: Desiree [name] MRN: [id number] Date of Birth: 1946-03-23

## 2020-02-03 NOTE — Telephone Encounter (Signed)
Please advise 

## 2020-02-03 NOTE — Telephone Encounter (Signed)
Time to stop muscle relaxant thanks

## 2020-02-07 ENCOUNTER — Other Ambulatory Visit: Payer: Self-pay

## 2020-02-07 ENCOUNTER — Other Ambulatory Visit: Payer: Self-pay | Admitting: Cardiovascular Disease

## 2020-02-07 ENCOUNTER — Ambulatory Visit (HOSPITAL_COMMUNITY): Payer: PPO | Admitting: Physical Therapy

## 2020-02-07 ENCOUNTER — Encounter (HOSPITAL_COMMUNITY): Payer: Self-pay | Admitting: Physical Therapy

## 2020-02-07 DIAGNOSIS — R2689 Other abnormalities of gait and mobility: Secondary | ICD-10-CM

## 2020-02-07 DIAGNOSIS — M6281 Muscle weakness (generalized): Secondary | ICD-10-CM

## 2020-02-07 DIAGNOSIS — M25561 Pain in right knee: Secondary | ICD-10-CM | POA: Diagnosis not present

## 2020-02-07 DIAGNOSIS — G8929 Other chronic pain: Secondary | ICD-10-CM

## 2020-02-07 DIAGNOSIS — M25661 Stiffness of right knee, not elsewhere classified: Secondary | ICD-10-CM

## 2020-02-07 NOTE — Telephone Encounter (Signed)
°*  STAT* If patient is at the pharmacy, call can be transferred to refill team.   1. Which medications need to be refilled? (please list name of each medication and dose if known)  losartan (COZAAR) 50 MG tablet  2. Which pharmacy/location (including street and city if local pharmacy) is medication to be sent to? [place], [place] [address]  3. Do they need a 30 day or 90 day supply? 27   Pt says her Pharmacy has tried to contact Dr. Loletha Grayer for a new rx but has not gotten a response

## 2020-02-07 NOTE — Therapy (Signed)
[place], [place], [address] Phone: [phone]   Fax:  [phone]  Physical Therapy Treatment  Patient Details  Name: Desiree [name] MRN: [id number] Date of Birth: 04/04/1946 Referring Provider (PT): Rodell Perna MD   Encounter Date: 02/07/2020   PT End of Session - 02/07/20 1456    Visit Number 12    Number of Visits 18    Date for PT Re-Evaluation 02/20/20   PN 01/30/20   Authorization Type [employer]    Progress Note Due on Visit 20    PT Start Time 1445    PT Stop Time 0932    PT Time Calculation (min) 45 min    Activity Tolerance Patient tolerated treatment well    Behavior During Therapy [facility] for tasks assessed/performed           Past Medical History:  Diagnosis Date   Allergic rhinitis    Arthritis HIPS/ WRISTS   Cancer ([place])    DDD (degenerative disc disease), cervical    GERD (gastroesophageal reflux disease) OCCASIONAL   Heart murmur    turbulent flow through LVOT, no obstruction 08/26/18 echo   History of Bell's palsy 2011  RIGHT SIDE -- RESOLVED   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--  S/P  RIGHT MASTECTMOY AND RADIATION--  NO RECURRANCE   History of CVA (cerebrovascular accident) PER SCAN IN 2011   Hyperlipidemia    Hypertension    Impaired fasting glucose PER PCP  DR Moshe Cipro   WATCH DIET   Mixed urge and stress incontinence    Nocturia    Sinus drainage    Thoracic ascending aortic aneurysm ([place]) LAST CHEST CT 09-02-2010--  FOLLOWED BY  CARDIOLOGIST--  DR [id number]   Vaginal wall prolapse     Past Surgical History:  Procedure Laterality Date   ABDOMINAL HYSTERECTOMY     BILATERAL CARPAL TUNNEL RELEASE  1980'S   CARDIAC CATHETERIZATION  02-01-2009  DR Henderson   NORMAL CORONARY ARTERIES/ NORMAL LV SIZE AND FUNCTION/ AORTIC ROOT SIZE AT THE UPPER LIMIT OF NORMAL    CARPAL TUNNEL RELEASE     CERVICAL [place] SURGERY  12-12-2005  DR Vertell Limber     LEFT  C6 - C7 HERINATED / DDD/ SPONDYLOSIS   COLONOSCOPY N/A 02/10/2014   Procedure: COLONOSCOPY;  Surgeon: Danie Binder, MD;  [facility];  Service: Endoscopy;  Laterality: N/A;  8:30   CYSTOCELE REPAIR  08/02/2012   Procedure: ANTERIOR REPAIR (CYSTOCELE);  Surgeon: Ailene Rud, MD;  [facility];  Service: Urology;  Laterality: N/A;  [employer] Uphold Anterior Pelvic Floor Sacrospinus Repair. Anterior wall of the vagina.   ESOPHAGOGASTRODUODENOSCOPY N/A 02/10/2014   Procedure: ESOPHAGOGASTRODUODENOSCOPY (EGD);  Surgeon: Danie Binder, MD;  [facility];  Service: Endoscopy;  Laterality: N/A;   GIVENS CAPSULE STUDY N/A 03/13/2014   Procedure: GIVENS CAPSULE STUDY;  Surgeon: Danie Binder, MD;  [facility];  Service: Endoscopy;  Laterality: N/A;  7:30   NECK SURGERY  2007   RIGHT PARTIAL MASTECTOMY  11-11-2004  DR PETER YOUNG   DUCTAL CARCINOMA IN SITU RIGHT BREAST   TOTAL ABDOMINAL HYSTERECTOMY W/ BILATERAL SALPINGOOPHORECTOMY  1990   TOTAL KNEE ARTHROPLASTY Right 12/21/2019   Procedure: RIGHT  TOTAL KNEE ARTHROPLASTY;  Surgeon: [name], MD;  Location: [place];  Service: Orthopedics;  Laterality: Right;   TRANSTHORACIC ECHOCARDIOGRAM  03-30-2008  DR MARGARET SIMPSON  LV SIZE AND FUNCTION NORMAL/ MODERATE AORTIC ARCH DILATATION/ MILD MR    There were no vitals filed for this visit.       [facility] PT Assessment - 02/07/20 0001      Assessment   Medical Diagnosis R TKA    Referring Provider (PT) Rodell Perna MD    Onset Date/Surgical Date 12/21/19    Next MD Visit 02/09/20                         [place] Adult PT Treatment/Exercise - 02/07/20 0001      Ambulation/Gait   Ambulation/Gait Yes    Ambulation/Gait Assistance 6: Modified independent (Device/Increase time)    Ambulation Distance (Feet) 226 Feet    Assistive device Straight cane    Gait velocity decreased    Stairs Yes     Stairs Assistance 5: Supervision    Stair Management Technique One rail Left;Forwards;Alternating pattern    Number of Stairs 7    Height of Stairs 4    Gait Comments cues to pick up right toes; 6 reps of stairs      Knee/Hip Exercises: Aerobic   Stationary Bike seat 9 rocking 4 minutes - PT assist - able to go over once by end       Knee/Hip Exercises: Standing   Other Standing Knee Exercises tandem on foam 3x30" B, SLS R 1 UE support x3 30"       Knee/Hip Exercises: Supine   Knee Extension AROM    Knee Extension Limitations 2    Knee Flexion AROM    Knee Flexion Limitations 113    Other Supine Knee/Hip Exercises PROM right knee into flexion - 2 minutes      Manual Therapy   Manual Therapy Soft tissue mobilization    Manual therapy comments Manual complete separate than rest of tx    Soft tissue mobilization STNM to distal right quad                     PT Short Term Goals - 01/30/20 1324      PT SHORT TERM GOAL #1   Title Patient will be independent with HEP in order to improve functional outcomes.    Time 3    Period Weeks    Status On-going    Target Date 01/30/20      PT SHORT TERM GOAL #2   Title Patient will report at least 25% improvement in symptoms for improved quality of life.    Baseline 80% better    Time 3    Period Weeks    Status Achieved    Target Date 01/30/20             PT Long Term Goals - 01/30/20 1325      PT LONG TERM GOAL #1   Title Patient will report at least 75% improvement in symptoms for improved quality of life.    Baseline 80% better    Time 6    Period Weeks    Status Achieved      PT LONG TERM GOAL #2   Title Patient will improve FOTO score by at least 10 points in order to indicate improved tolerance to activity.    Time 6    Period Weeks    Status Achieved      PT LONG TERM GOAL #3   Title Patient will be able to ambulate for at least 30 minutes with  pain no greater than 1/10 in order to demonstrate improved  ability to ambulate in the community.    Baseline 5-10 minutes    Time 6    Period Weeks    Status On-going      PT LONG TERM GOAL #4   Title Patient will be able to complete 5x STS in under 11.4 seconds in order to reduce the risk of falls.    Baseline 16.9 seconds    Time 6    Period Weeks    Status On-going                 Plan - 02/07/20 1539    Clinical Impression Statement Improved knee ROM noted during todays session. Able to complete full revolutions on bike. Patient continues to improve with each session. Added balance exercises to work on improving trust in right leg with weightbearing. Fatigue noted end of session. Will continue to work on knee ROM and functional strength as tolerated.    Personal Factors and Comorbidities Age;Behavior Pattern;Time since onset of injury/illness/exacerbation;Fitness;Comorbidity 3+    Comorbidities obesity, HTN, chronic knee pain    Examination-Activity Limitations Bathing;Bed Mobility;Bend;Lift;Locomotion Level;Sit;Sleep;Squat;Stairs;Stand;Transfers    Examination-Participation Restrictions Church;Cleaning;Driving;Laundry;Meal Prep;Volunteer;Yard Work    Stability/Clinical Decision Making Stable/Uncomplicated    Rehab Potential Good    PT Frequency 3x / week    PT Duration 6 weeks    PT Treatment/Interventions ADLs/Self Care Home Management;Aquatic Therapy;Biofeedback;Cryotherapy;Electrical Stimulation;Iontophoresis 4mg /ml Dexamethasone;Traction;Moist Heat;Ultrasound;Parrafin;Fluidtherapy;Contrast Bath;DME Instruction;Gait training;Stair training;Functional mobility training;Therapeutic activities;Therapeutic exercise;Balance training;Neuromuscular re-education;Patient/family education;Orthotic Fit/Training;Manual techniques;Compression bandaging;Scar mobilization;Passive range of motion;Dry needling;Energy conservation;Splinting;Taping;Vasopneumatic Device;Spinal Manipulations;Joint Manipulations    PT Next Visit Plan Primary focus on  improving knee flexion to WNL, funtional strengthening, stability and gait using LRAD.  Add static balance actvities next session.  Continue with myofascial techniques to reduce scar tissue.    PT Home Exercise Plan 5/19 Heel slides in seated, heel slides in supine, quad sets, clams, LAQ; 5/20 gait training with quad cane at home 5/24 sit to stands    Consulted and Agree with Plan of Care Patient           Patient will benefit from skilled therapeutic intervention in order to improve the following deficits and impairments:  Abnormal gait, Decreased activity tolerance, Decreased balance, Decreased mobility, Decreased endurance, Decreased scar mobility, Decreased range of motion, Decreased strength, Difficulty walking, Hypomobility, Increased muscle spasms, Impaired perceived functional ability, Improper body mechanics, Obesity, Pain  Visit Diagnosis: Right knee pain, unspecified chronicity  Other abnormalities of gait and mobility  Stiffness of right knee, not elsewhere classified  Muscle weakness (generalized)  Chronic pain of right knee     Problem List Patient Active Problem List   Diagnosis Date Noted   S/P TKR (total knee replacement), right 12/21/2019   Pre-operative clearance 11/10/2019   Acute cystitis with hematuria 11/10/2019   Constipation 11/10/2019   Unilateral primary osteoarthritis, right knee 10/30/2019   Fatigue 10/18/2019   Left shoulder pain 04/11/2019   Ganglion cyst of volar aspect of left wrist 10/05/2018   Fall at home, subsequent encounter 06/07/2018   Knee pain, right 05/31/2017   CKD (chronic kidney disease) stage 3, GFR 30-59 ml/min 10/01/2016   Neck pain on left side 01/01/2016   Annual physical exam 02/15/2015   Left ventricular outflow tract obstruction 07/27/2014   Urinary incontinence 35/32/9924   Metabolic syndrome X 26/83/4196   Osteopenia 12/12/2013   Anemia 11/29/2013   Obesity (BMI 30-39.9) 10/17/2012   Thoracic  aortic aneurysm ([place]) 10/12/2012  Allergic rhinitis 03/02/2012   BLADDER PROLAPSE 09/24/2010   NEOPLASM, MALIGNANT, BREAST, RIGHT 02/04/2008   Hyperlipidemia LDL goal <100 02/04/2008   Essential hypertension 02/04/2008   3:44 PM, 02/07/20 Jerene Pitch, DPT Physical Therapy with [facility]  [phone] office  [place], [place], [address] Phone: [phone]   Fax:  [phone]  Name: Desiree [name] MRN: [id number] Date of Birth: May 30, 1946

## 2020-02-08 MED ORDER — LOSARTAN POTASSIUM 50 MG PO TABS: ORAL_TABLET | ORAL | 3 refills | Status: DC

## 2020-02-09 ENCOUNTER — Ambulatory Visit (HOSPITAL_COMMUNITY): Payer: PPO | Admitting: Physical Therapy

## 2020-02-09 ENCOUNTER — Encounter (HOSPITAL_COMMUNITY): Payer: Self-pay | Admitting: Physical Therapy

## 2020-02-09 ENCOUNTER — Ambulatory Visit (INDEPENDENT_AMBULATORY_CARE_PROVIDER_SITE_OTHER): Payer: PPO | Admitting: Orthopaedic Surgery

## 2020-02-09 ENCOUNTER — Other Ambulatory Visit: Payer: Self-pay

## 2020-02-09 ENCOUNTER — Encounter: Payer: Self-pay | Admitting: Orthopaedic Surgery

## 2020-02-09 VITALS — BP 118/71 | HR 76 | Ht 61.0 in | Wt 188.0 lb

## 2020-02-09 DIAGNOSIS — M25661 Stiffness of right knee, not elsewhere classified: Secondary | ICD-10-CM

## 2020-02-09 DIAGNOSIS — M25561 Pain in right knee: Secondary | ICD-10-CM

## 2020-02-09 DIAGNOSIS — Z96651 Presence of right artificial knee joint: Secondary | ICD-10-CM

## 2020-02-09 DIAGNOSIS — M6281 Muscle weakness (generalized): Secondary | ICD-10-CM

## 2020-02-09 DIAGNOSIS — R2689 Other abnormalities of gait and mobility: Secondary | ICD-10-CM

## 2020-02-09 NOTE — Progress Notes (Signed)
Post-Op Visit Note   Patient: ARLETHA MARSCHKE           Date of Birth: 1946/04/01           MRN: [id number] Visit Date: 02/09/2020 PCP: [name], MD   Assessment & Plan: Post total knee arthroplasty.  Patient still has 10 degree flexion contracture has weak quad has problems going up on a step and needs to continue working on quad strength and knee extension.  Flexion is to 112.  Recheck 5 weeks.  We discussed the areas that she needs to work on the most.  Chief Complaint:  Chief Complaint  Patient presents with  . Right Knee - Follow-up    12/21/2019 Right TKA   Visit Diagnoses:  1. S/P TKR (total knee replacement), right     Plan: Continue rehab to reach her goals.  Follow-Up Instructions: Return in about 5 weeks (around 03/15/2020).   Orders:  No orders of the defined types were placed in this encounter.  No orders of the defined types were placed in this encounter.   Imaging: No results found.  PMFS History: Patient Active Problem List   Diagnosis Date Noted  . S/P TKR (total knee replacement), right 12/21/2019  . Pre-operative clearance 11/10/2019  . Acute cystitis with hematuria 11/10/2019  . Constipation 11/10/2019  . Fatigue 10/18/2019  . Left shoulder pain 04/11/2019  . Ganglion cyst of volar aspect of left wrist 10/05/2018  . Fall at home, subsequent encounter 06/07/2018  . Knee pain, right 05/31/2017  . CKD (chronic kidney disease) stage 3, GFR 30-59 ml/min 10/01/2016  . Neck pain on left side 01/01/2016  . Annual physical exam 02/15/2015  . 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 Urinary incontinence 05/22/2014  . Metabolic syndrome X 22/09/5425  . Osteopenia 12/12/2013  . Anemia 11/29/2013  . Obesity (BMI 30-39.9) 10/17/2012  . Thoracic aortic aneurysm ([place]) 10/12/2012  . Allergic rhinitis 03/02/2012  . BLADDER PROLAPSE 09/24/2010  . NEOPLASM, MALIGNANT, BREAST, RIGHT 02/04/2008  . Hyperlipidemia LDL goal <100  02/04/2008  . Essential hypertension 02/04/2008   Past Medical History:  Diagnosis Date  . Allergic rhinitis   . Arthritis HIPS/ WRISTS  . Cancer ([place])   . DDD (degenerative disc disease), cervical   . GERD (gastroesophageal reflux disease) OCCASIONAL  . Heart murmur    turbulent flow through LVOT, no obstruction 08/26/18 echo  . History of Bell's palsy 2011  RIGHT SIDE -- RESOLVED  . 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 History of CVA (cerebrovascular accident) PER SCAN IN 2011  . Hyperlipidemia   . Hypertension   . Impaired fasting glucose PER PCP  DR SIMPSON   WATCH DIET  . Mixed urge and stress incontinence   . Nocturia   . Sinus drainage   . Thoracic ascending aortic aneurysm ([place]) LAST CHEST CT 09-02-2010--  FOLLOWED BY  CARDIOLOGIST--  DR [id number]  . Vaginal wall prolapse     Family History  Problem Relation Age of Onset  . Kidney failure Mother   . Hypertension Mother   . Heart attack Mother 38  . Throat cancer Father   . Prostate cancer Father   . Lung cancer Father 34  . Breast cancer Sister   . Hypertension Brother        2 stents  . Colon cancer Neg Hx   . Colon polyps Neg Hx  Past Surgical History:  Procedure Laterality Date  . ABDOMINAL HYSTERECTOMY    . BILATERAL CARPAL TUNNEL RELEASE  1980'S  . CARDIAC CATHETERIZATION  02-01-2009  DR EICHHORN   NORMAL CORONARY ARTERIES/ NORMAL LV SIZE AND FUNCTION/ AORTIC ROOT SIZE AT THE UPPER LIMIT OF NORMAL   . CARPAL TUNNEL RELEASE    . CERVICAL DISC SURGERY  12-12-2005  DR Vertell Limber   LEFT  C6 - C7 HERINATED / DDD/ SPONDYLOSIS  . COLONOSCOPY N/A 02/10/2014   Procedure: COLONOSCOPY;  Surgeon: Danie Binder, MD;  [facility];  Service: Endoscopy;  Laterality: N/A;  8:30  . CYSTOCELE REPAIR  08/02/2012   Procedure: ANTERIOR REPAIR (CYSTOCELE);  Surgeon: Ailene Rud, MD;  [facility];  Service:  Urology;  Laterality: N/A;  [employer] Uphold Anterior Pelvic Floor Sacrospinus Repair. Anterior wall of the vagina.  . ESOPHAGOGASTRODUODENOSCOPY N/A 02/10/2014   Procedure: ESOPHAGOGASTRODUODENOSCOPY (EGD);  Surgeon: Danie Binder, MD;  [facility];  Service: Endoscopy;  Laterality: N/A;  . GIVENS CAPSULE STUDY N/A 03/13/2014   Procedure: GIVENS CAPSULE STUDY;  Surgeon: Danie Binder, MD;  [facility];  Service: Endoscopy;  Laterality: N/A;  7:30  . NECK SURGERY  2007  . RIGHT PARTIAL MASTECTOMY  11-11-2004  DR [name] YOUNG   DUCTAL CARCINOMA IN SITU RIGHT BREAST  . TOTAL ABDOMINAL HYSTERECTOMY W/ BILATERAL SALPINGOOPHORECTOMY  1990  . TOTAL KNEE ARTHROPLASTY Right 12/21/2019   Procedure: RIGHT  TOTAL KNEE ARTHROPLASTY;  Surgeon: [name], MD;  Location: [facility];  Service: Orthopedics;  Laterality: Right;  . TRANSTHORACIC ECHOCARDIOGRAM  03-30-2008  DR MARGARET SIMPSON   LV SIZE AND FUNCTION NORMAL/ MODERATE AORTIC ARCH DILATATION/ MILD MR   Social History   Occupational History  . Not on file  Tobacco Use  . Smoking status: Never Smoker  . Smokeless tobacco: Never Used  Vaping Use  . Vaping Use: Never used  Substance and Sexual Activity  . Alcohol use: No  . Drug use: No  . Sexual activity: Not Currently

## 2020-02-09 NOTE — Therapy (Signed)
[place], [place], [address] Phone: [phone]   Fax:  [phone]  Physical Therapy Treatment  Patient Details  Name: Desiree [name] MRN: [id number] Date of Birth: 1946/03/29 Referring Provider (PT): Rodell Perna MD   Encounter Date: 02/09/2020   PT End of Session - 02/09/20 1531    Visit Number 13    Number of Visits 18    Date for PT Re-Evaluation 02/20/20    Authorization Type [employer]    Progress Note Due on Visit 20    PT Start Time 1450    PT Stop Time 1530    PT Time Calculation (min) 40 min    Activity Tolerance Patient tolerated treatment well    Behavior During Therapy [facility] for tasks assessed/performed           Past Medical History:  Diagnosis Date  . Allergic rhinitis   . Arthritis HIPS/ WRISTS  . Cancer ([place])   . DDD (degenerative disc disease), cervical   . GERD (gastroesophageal reflux disease) OCCASIONAL  . Heart murmur    turbulent flow through LVOT, no obstruction 08/26/18 echo  . History of Bell's palsy 2011  RIGHT SIDE -- RESOLVED  . 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 History of CVA (cerebrovascular accident) PER SCAN IN 2011  . Hyperlipidemia   . Hypertension   . Impaired fasting glucose PER PCP  DR SIMPSON   WATCH DIET  . Mixed urge and stress incontinence   . Nocturia   . Sinus drainage   . Thoracic ascending aortic aneurysm ([place]) LAST CHEST CT 09-02-2010--  FOLLOWED BY  CARDIOLOGIST--  DR [id number]  . Vaginal wall prolapse     Past Surgical History:  Procedure Laterality Date  . ABDOMINAL HYSTERECTOMY    . BILATERAL CARPAL TUNNEL RELEASE  1980'S  . CARDIAC CATHETERIZATION  02-01-2009  DR EICHHORN   NORMAL CORONARY ARTERIES/ NORMAL LV SIZE AND FUNCTION/ AORTIC ROOT SIZE AT THE UPPER LIMIT OF NORMAL   . CARPAL TUNNEL RELEASE    . CERVICAL DISC SURGERY  12-12-2005  DR Vertell Limber   LEFT  C6  - C7 HERINATED / DDD/ SPONDYLOSIS  . COLONOSCOPY N/A 02/10/2014   Procedure: COLONOSCOPY;  Surgeon: Danie Binder, MD;  [facility];  Service: Endoscopy;  Laterality: N/A;  8:30  . CYSTOCELE REPAIR  08/02/2012   Procedure: ANTERIOR REPAIR (CYSTOCELE);  Surgeon: Ailene Rud, MD;  Location[facility];  Service: Urology;  Laterality: N/A;  [employer] Uphold Anterior Pelvic Floor Sacrospinus Repair. Anterior wall of the vagina.  . ESOPHAGOGASTRODUODENOSCOPY N/A 02/10/2014   Procedure: ESOPHAGOGASTRODUODENOSCOPY (EGD);  Surgeon: Danie Binder, MD;  [facility];  Service: Endoscopy;  Laterality: N/A;  . GIVENS CAPSULE STUDY N/A 03/13/2014   Procedure: GIVENS CAPSULE STUDY;  Surgeon: Danie Binder, MD;  [facility];  Service: Endoscopy;  Laterality: N/A;  7:30  . NECK SURGERY  2007  . RIGHT PARTIAL MASTECTOMY  11-11-2004  DR [name] YOUNG   DUCTAL CARCINOMA IN SITU RIGHT BREAST  . TOTAL ABDOMINAL HYSTERECTOMY W/ BILATERAL SALPINGOOPHORECTOMY  1990  . TOTAL KNEE ARTHROPLASTY Right 12/21/2019   Procedure: RIGHT  TOTAL KNEE ARTHROPLASTY;  Surgeon: [name], MD;  Location: [place];  Service: Orthopedics;  Laterality: Right;  . TRANSTHORACIC ECHOCARDIOGRAM  03-30-2008  DR MARGARET SIMPSON   LV SIZE  AND FUNCTION NORMAL/ MODERATE AORTIC ARCH DILATATION/ MILD MR    There were no vitals filed for this visit.   Subjective Assessment - 02/09/20 1451    Subjective PT To MD today who wants pt to focus on knee extension and improving quadricep strength.    Pertinent History Cancer, chronic knee pain    Limitations Sitting;Lifting;Standing;Walking;House hold activities    How long can you walk comfortably? 10 minutes was 5 minutes    Patient Stated Goals Improve function    Currently in Pain? Yes    Pain Score 3     Pain Location Knee    Pain Orientation Right    Pain Descriptors / Indicators Aching    Pain Onset 1 to 4 weeks ago    Pain  Frequency Intermittent    Aggravating Factors  bending    Pain Relieving Factors nothing                             [facility] Adult PT Treatment/Exercise - 02/09/20 0001      Knee/Hip Exercises: Stretches   Active Hamstring Stretch Right;3 reps;30 seconds    Passive Hamstring Stretch Right;3 reps;30 seconds    Knee: Self-Stretch to increase Flexion 10 seconds;Right;5 reps    Knee: Self-Stretch Limitations 12" stool    Gastroc Stretch Both;3 reps;30 seconds    Gastroc Stretch Limitations slant board      Knee/Hip Exercises: Standing   Heel Raises Both;15 reps    Knee Flexion Right;15 reps    Terminal Knee Extension 1 set;10 reps    Forward Step Up Hand Hold: 1;Step Height: 6";3 sets;10 reps;Right      Knee/Hip Exercises: Seated   Long Arc Quad Right;10 reps;Weights    Long Arc Quad Weight 4 lbs.      Knee/Hip Exercises: Supine   Quad Sets 10 reps    Short Arc Quad Sets Strengthening;Right;10 reps    Short Arc Quad Sets Limitations 4    Knee Extension AROM    Knee Extension Limitations 2    Knee Flexion AROM    Knee Flexion Limitations 114      Manual Therapy   Manual Therapy Soft tissue mobilization    Manual therapy comments Manual complete separate than rest of tx                    PT Short Term Goals - 01/30/20 1324      PT SHORT TERM GOAL #1   Title Patient will be independent with HEP in order to improve functional outcomes.    Time 3    Period Weeks    Status On-going    Target Date 01/30/20      PT SHORT TERM GOAL #2   Title Patient will report at least 25% improvement in symptoms for improved quality of life.    Baseline 80% better    Time 3    Period Weeks    Status Achieved    Target Date 01/30/20             PT Long Term Goals - 01/30/20 1325      PT LONG TERM GOAL #1   Title Patient will report at least 75% improvement in symptoms for improved quality of life.    Baseline 80% better    Time 6    Period Weeks     Status Achieved      PT LONG TERM GOAL #  2   Title Patient will improve FOTO score by at least 10 points in order to indicate improved tolerance to activity.    Time 6    Period Weeks    Status Achieved      PT LONG TERM GOAL #3   Title Patient will be able to ambulate for at least 30 minutes with pain no greater than 1/10 in order to demonstrate improved ability to ambulate in the community.    Baseline 5-10 minutes    Time 6    Period Weeks    Status On-going      PT LONG TERM GOAL #4   Title Patient will be able to complete 5x STS in under 11.4 seconds in order to reduce the risk of falls.    Baseline 16.9 seconds    Time 6    Period Weeks    Status On-going                 Plan - 02/09/20 1532    Clinical Impression Statement Pt continues to gain ROM but also continues to have increased edema.  Manual completed to decrease edema.  Pt MD request to work on quadricep strengthening and extension, however extension is at 2 and therapist feels that it is the edema preventing 0; Pt will need continued skilled therapy to work on steps and balance.    Personal Factors and Comorbidities Age;Behavior Pattern;Time since onset of injury/illness/exacerbation;Fitness;Comorbidity 3+    Comorbidities obesity, HTN, chronic knee pain    Examination-Activity Limitations Bathing;Bed Mobility;Bend;Lift;Locomotion Level;Sit;Sleep;Squat;Stairs;Stand;Transfers    Examination-Participation Restrictions Church;Cleaning;Driving;Laundry;Meal Prep;Volunteer;Yard Work    Stability/Clinical Decision Making Stable/Uncomplicated    Rehab Potential Good    PT Frequency 3x / week    PT Duration 6 weeks    PT Treatment/Interventions ADLs/Self Care Home Management;Aquatic Therapy;Biofeedback;Cryotherapy;Electrical Stimulation;Iontophoresis 4mg /ml Dexamethasone;Traction;Moist Heat;Ultrasound;Parrafin;Fluidtherapy;Contrast Bath;DME Instruction;Gait training;Stair training;Functional mobility  training;Therapeutic activities;Therapeutic exercise;Balance training;Neuromuscular re-education;Patient/family education;Orthotic Fit/Training;Manual techniques;Compression bandaging;Scar mobilization;Passive range of motion;Dry needling;Energy conservation;Splinting;Taping;Vasopneumatic Device;Spinal Manipulations;Joint Manipulations    PT Next Visit Plan Primary focus on improving knee flexion to WNL, funtional strengthening, stability and gait using LRAD.  Add static balance actvities next session.  Continue with myofascial techniques to reduce scar tissue.    PT Home Exercise Plan 5/19 Heel slides in seated, heel slides in supine, quad sets, clams, LAQ; 5/20 gait training with quad cane at home 5/24 sit to stands    Consulted and Agree with Plan of Care Patient           Patient will benefit from skilled therapeutic intervention in order to improve the following deficits and impairments:  Abnormal gait, Decreased activity tolerance, Decreased balance, Decreased mobility, Decreased endurance, Decreased scar mobility, Decreased range of motion, Decreased strength, Difficulty walking, Hypomobility, Increased muscle spasms, Impaired perceived functional ability, Improper body mechanics, Obesity, Pain  Visit Diagnosis: Right knee pain, unspecified chronicity  Other abnormalities of gait and mobility  Stiffness of right knee, not elsewhere classified  Muscle weakness (generalized)     Problem List Patient Active Problem List   Diagnosis Date Noted  . S/P TKR (total knee replacement), right 12/21/2019  . Pre-operative clearance 11/10/2019  . Acute cystitis with hematuria 11/10/2019  . Constipation 11/10/2019  . Fatigue 10/18/2019  . Left shoulder pain 04/11/2019  . Ganglion cyst of volar aspect of left wrist 10/05/2018  . Fall at home, subsequent encounter 06/07/2018  . Knee pain, right 05/31/2017  . CKD (chronic kidney disease) stage 3, GFR 30-59 ml/min 10/01/2016  . Neck  pain on  left side 01/01/2016  . Annual physical exam 02/15/2015  . 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 Urinary incontinence 05/22/2014  . Metabolic syndrome X 73/57/8978  . Osteopenia 12/12/2013  . Anemia 11/29/2013  . Obesity (BMI 30-39.9) 10/17/2012  . Thoracic aortic aneurysm ([place]) 10/12/2012  . Allergic rhinitis 03/02/2012  . BLADDER PROLAPSE 09/24/2010  . NEOPLASM, MALIGNANT, BREAST, RIGHT 02/04/2008  . Hyperlipidemia LDL goal <100 02/04/2008  . Essential hypertension 02/04/2008    Rayetta Humphrey, PT CLT [phone] 02/09/2020, 3:35 PM  [place] [address] [place], [place], [address] Phone: [phone]   Fax:  [phone]  Name: Desiree [name] MRN: [id number] Date of Birth: 1946/04/06

## 2020-02-12 ENCOUNTER — Other Ambulatory Visit: Payer: Self-pay | Admitting: Orthopaedic Surgery

## 2020-02-13 ENCOUNTER — Other Ambulatory Visit: Payer: Self-pay

## 2020-02-13 ENCOUNTER — Ambulatory Visit (HOSPITAL_COMMUNITY): Payer: PPO | Admitting: Physical Therapy

## 2020-02-13 ENCOUNTER — Encounter (HOSPITAL_COMMUNITY): Payer: Self-pay | Admitting: Physical Therapy

## 2020-02-13 DIAGNOSIS — R2689 Other abnormalities of gait and mobility: Secondary | ICD-10-CM

## 2020-02-13 DIAGNOSIS — M6281 Muscle weakness (generalized): Secondary | ICD-10-CM

## 2020-02-13 DIAGNOSIS — M25561 Pain in right knee: Secondary | ICD-10-CM

## 2020-02-13 DIAGNOSIS — M25661 Stiffness of right knee, not elsewhere classified: Secondary | ICD-10-CM

## 2020-02-13 NOTE — Therapy (Signed)
[place], [place], [address] Phone: [phone]   Fax:  [phone]  Physical Therapy Treatment  Patient Details  Name: Desiree [name] MRN: [id number] Date of Birth: 09/18/1945 Referring Provider (PT): Rodell Perna MD   Encounter Date: 02/13/2020   PT End of Session - 02/13/20 1437    Visit Number 14    Number of Visits 18    Date for PT Re-Evaluation 02/20/20    Authorization Type [employer]    Progress Note Due on Visit 20    PT Start Time 1433    PT Stop Time 1513    PT Time Calculation (min) 40 min    Activity Tolerance Patient tolerated treatment well    Behavior During Therapy [facility] for tasks assessed/performed           Past Medical History:  Diagnosis Date  . Allergic rhinitis   . Arthritis HIPS/ WRISTS  . Cancer ([place])   . DDD (degenerative disc disease), cervical   . GERD (gastroesophageal reflux disease) OCCASIONAL  . Heart murmur    turbulent flow through LVOT, no obstruction 08/26/18 echo  . History of Bell's palsy 2011  RIGHT SIDE -- RESOLVED  . 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 History of CVA (cerebrovascular accident) PER SCAN IN 2011  . Hyperlipidemia   . Hypertension   . Impaired fasting glucose PER PCP  DR SIMPSON   WATCH DIET  . Mixed urge and stress incontinence   . Nocturia   . Sinus drainage   . Thoracic ascending aortic aneurysm ([place]) LAST CHEST CT 09-02-2010--  FOLLOWED BY  CARDIOLOGIST--  DR [id number]  . Vaginal wall prolapse     Past Surgical History:  Procedure Laterality Date  . ABDOMINAL HYSTERECTOMY    . BILATERAL CARPAL TUNNEL RELEASE  1980'S  . CARDIAC CATHETERIZATION  02-01-2009  DR EICHHORN   NORMAL CORONARY ARTERIES/ NORMAL LV SIZE AND FUNCTION/ AORTIC ROOT SIZE AT THE UPPER LIMIT OF NORMAL   . CARPAL TUNNEL RELEASE    . CERVICAL DISC SURGERY  12-12-2005  DR Vertell Limber   LEFT  C6  - C7 HERINATED / DDD/ SPONDYLOSIS  . COLONOSCOPY N/A 02/10/2014   Procedure: COLONOSCOPY;  Surgeon: Danie Binder, MD;  [facility];  Service: Endoscopy;  Laterality: N/A;  8:30  . CYSTOCELE REPAIR  08/02/2012   Procedure: ANTERIOR REPAIR (CYSTOCELE);  Surgeon: Ailene Rud, MD;  [facility];  Service: Urology;  Laterality: N/A;  [employer] Uphold Anterior Pelvic Floor Sacrospinus Repair. Anterior wall of the vagina.  . ESOPHAGOGASTRODUODENOSCOPY N/A 02/10/2014   Procedure: ESOPHAGOGASTRODUODENOSCOPY (EGD);  Surgeon: Danie Binder, MD;  [facility];  Service: Endoscopy;  Laterality: N/A;  . GIVENS CAPSULE STUDY N/A 03/13/2014   Procedure: GIVENS CAPSULE STUDY;  Surgeon: Danie Binder, MD;  [facility];  Service: Endoscopy;  Laterality: N/A;  7:30  . NECK SURGERY  2007  . RIGHT PARTIAL MASTECTOMY  11-11-2004  DR [name] YOUNG   DUCTAL CARCINOMA IN SITU RIGHT BREAST  . TOTAL ABDOMINAL HYSTERECTOMY W/ BILATERAL SALPINGOOPHORECTOMY  1990  . TOTAL KNEE ARTHROPLASTY Right 12/21/2019   Procedure: RIGHT  TOTAL KNEE ARTHROPLASTY;  Surgeon: [name], MD;  Location: [place];  Service: Orthopedics;  Laterality: Right;  . TRANSTHORACIC ECHOCARDIOGRAM  03-30-2008  DR MARGARET SIMPSON   LV SIZE  AND FUNCTION NORMAL/ MODERATE AORTIC ARCH DILATATION/ MILD MR    There were no vitals filed for this visit.   Subjective Assessment - 02/13/20 1432    Subjective Patient is trying to use her cane now. Her knee is still sore. Her home exercises are going alright. She was very sore today after her morning exercises.    Pertinent History Cancer, chronic knee pain    Limitations Sitting;Lifting;Standing;Walking;House hold activities    How long can you walk comfortably? 10 minutes was 5 minutes    Patient Stated Goals Improve function    Currently in Pain? Yes    Pain Score 3     Pain Location Knee    Pain Orientation Right    Pain Onset 1  to 4 weeks ago                             [facility] Adult PT Treatment/Exercise - 02/13/20 0001      Knee/Hip Exercises: Stretches   Gastroc Stretch Both;3 reps;30 seconds    Gastroc Stretch Limitations slant board      Knee/Hip Exercises: Aerobic   Stationary Bike seat 9 rocking 4 minutes for ROM      Knee/Hip Exercises: Machines for Strengthening   Other Machine leg press 2x10 20#      Knee/Hip Exercises: Standing   Heel Raises Both;15 reps    Terminal Knee Extension 1 set;10 reps    Terminal Knee Extension Limitations purple tube band 1x10 with 10 second holds    Forward Step Up Hand Hold: 1;Step Height: 6";3 sets;10 reps;Right    Other Standing Knee Exercises tandem stance on ground 2x30" B, SLS 2x 30 seconds bilateral       Knee/Hip Exercises: Supine   Knee Extension AROM    Knee Extension Limitations 2    Knee Flexion AROM    Knee Flexion Limitations 113                  PT Education - 02/13/20 1437    Education Details Review of HEP, continuing HEP, mechanics of exercises    Person(s) Educated Patient    Methods Explanation    Comprehension Verbalized understanding;Returned demonstration            PT Short Term Goals - 01/30/20 1324      PT SHORT TERM GOAL #1   Title Patient will be independent with HEP in order to improve functional outcomes.    Time 3    Period Weeks    Status On-going    Target Date 01/30/20      PT SHORT TERM GOAL #2   Title Patient will report at least 25% improvement in symptoms for improved quality of life.    Baseline 80% better    Time 3    Period Weeks    Status Achieved    Target Date 01/30/20             PT Long Term Goals - 01/30/20 1325      PT LONG TERM GOAL #1   Title Patient will report at least 75% improvement in symptoms for improved quality of life.    Baseline 80% better    Time 6    Period Weeks    Status Achieved      PT LONG TERM GOAL #2   Title Patient will improve FOTO  score by at least 10 points in order to indicate improved tolerance  to activity.    Time 6    Period Weeks    Status Achieved      PT LONG TERM GOAL #3   Title Patient will be able to ambulate for at least 30 minutes with pain no greater than 1/10 in order to demonstrate improved ability to ambulate in the community.    Baseline 5-10 minutes    Time 6    Period Weeks    Status On-going      PT LONG TERM GOAL #4   Title Patient will be able to complete 5x STS in under 11.4 seconds in order to reduce the risk of falls.    Baseline 16.9 seconds    Time 6    Period Weeks    Status On-going                 Plan - 02/13/20 1438    Clinical Impression Statement Patient able to complete step up with unilateral UE support today but requires intermittent verbal cueing for reducing bilateral UE support. Patient tolerates bike today for increasing knee flexion ROM but is hesitant due to full oscillation discomfort experienced in last session. Patient requires some verbal cueing for mechanics of bike for full flexion in each direction. Patient demonstrates good static balance on ground with min/no sway with tandem stance. She requires intermittent UE support with SLS and shows better balance on RLE >LLE. Patient tolerates leg press with good mechanics and eccentric control. Patient educated on continuing scar massage and edema message. Patient will benefit from skilled physical therapy in order to improve function and reduce impairment.    Personal Factors and Comorbidities Age;Behavior Pattern;Time since onset of injury/illness/exacerbation;Fitness;Comorbidity 3+    Comorbidities obesity, HTN, chronic knee pain    Examination-Activity Limitations Bathing;Bed Mobility;Bend;Lift;Locomotion Level;Sit;Sleep;Squat;Stairs;Stand;Transfers    Examination-Participation Restrictions Church;Cleaning;Driving;Laundry;Meal Prep;Volunteer;Yard Work    Stability/Clinical Decision Making Stable/Uncomplicated     Rehab Potential Good    PT Frequency 3x / week    PT Duration 6 weeks    PT Treatment/Interventions ADLs/Self Care Home Management;Aquatic Therapy;Biofeedback;Cryotherapy;Electrical Stimulation;Iontophoresis 4mg /ml Dexamethasone;Traction;Moist Heat;Ultrasound;Parrafin;Fluidtherapy;Contrast Bath;DME Instruction;Gait training;Stair training;Functional mobility training;Therapeutic activities;Therapeutic exercise;Balance training;Neuromuscular re-education;Patient/family education;Orthotic Fit/Training;Manual techniques;Compression bandaging;Scar mobilization;Passive range of motion;Dry needling;Energy conservation;Splinting;Taping;Vasopneumatic Device;Spinal Manipulations;Joint Manipulations    PT Next Visit Plan Primary focus on improving knee flexion to WNL, funtional strengthening, stability and gait using LRAD.  Add static balance actvities next session.  Continue with myofascial techniques to reduce scar tissue.    PT Home Exercise Plan 5/19 Heel slides in seated, heel slides in supine, quad sets, clams, LAQ; 5/20 gait training with quad cane at home 5/24 sit to stands    Consulted and Agree with Plan of Care Patient           Patient will benefit from skilled therapeutic intervention in order to improve the following deficits and impairments:  Abnormal gait, Decreased activity tolerance, Decreased balance, Decreased mobility, Decreased endurance, Decreased scar mobility, Decreased range of motion, Decreased strength, Difficulty walking, Hypomobility, Increased muscle spasms, Impaired perceived functional ability, Improper body mechanics, Obesity, Pain  Visit Diagnosis: Right knee pain, unspecified chronicity  Other abnormalities of gait and mobility  Stiffness of right knee, not elsewhere classified  Muscle weakness (generalized)     Problem List Patient Active Problem List   Diagnosis Date Noted  . S/P TKR (total knee replacement), right 12/21/2019  . Pre-operative clearance  11/10/2019  . Acute cystitis with hematuria 11/10/2019  . Constipation 11/10/2019  . Fatigue 10/18/2019  . Left  shoulder pain 04/11/2019  . Ganglion cyst of volar aspect of left wrist 10/05/2018  . Fall at home, subsequent encounter 06/07/2018  . Knee pain, right 05/31/2017  . CKD (chronic kidney disease) stage 3, GFR 30-59 ml/min 10/01/2016  . Neck pain on left side 01/01/2016  . Annual physical exam 02/15/2015  . 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 Urinary incontinence 05/22/2014  . Metabolic syndrome X 00/71/2197  . Osteopenia 12/12/2013  . Anemia 11/29/2013  . Obesity (BMI 30-39.9) 10/17/2012  . Thoracic aortic aneurysm ([place]) 10/12/2012  . Allergic rhinitis 03/02/2012  . BLADDER PROLAPSE 09/24/2010  . NEOPLASM, MALIGNANT, BREAST, RIGHT 02/04/2008  . Hyperlipidemia LDL goal <100 02/04/2008  . Essential hypertension 02/04/2008    3:17 PM, 02/13/20 Mearl Latin PT, DPT Physical Therapist at [place], [place], [address] Phone: [phone]   Fax:  [phone]  Name: Desiree [name] MRN: [id number] Date of Birth: [birth year]

## 2020-02-13 NOTE — Telephone Encounter (Signed)
Please advise 

## 2020-02-15 ENCOUNTER — Encounter (HOSPITAL_COMMUNITY): Payer: Self-pay | Admitting: Physical Therapy

## 2020-02-15 ENCOUNTER — Ambulatory Visit (HOSPITAL_COMMUNITY): Payer: PPO | Admitting: Physical Therapy

## 2020-02-15 ENCOUNTER — Other Ambulatory Visit: Payer: Self-pay

## 2020-02-15 DIAGNOSIS — M25561 Pain in right knee: Secondary | ICD-10-CM

## 2020-02-15 DIAGNOSIS — M25661 Stiffness of right knee, not elsewhere classified: Secondary | ICD-10-CM

## 2020-02-15 DIAGNOSIS — M6281 Muscle weakness (generalized): Secondary | ICD-10-CM

## 2020-02-15 DIAGNOSIS — R2689 Other abnormalities of gait and mobility: Secondary | ICD-10-CM

## 2020-02-15 NOTE — Therapy (Signed)
[address] [place], [place], [address] Phone: [phone]   Fax:  [phone]  Physical Therapy Treatment  Patient Details  Name: Desiree [name] MRN: [id number] Date of Birth: 04-25-1946 Referring Provider (PT): Rodell Perna MD   Encounter Date: 02/15/2020   PT End of Session - 02/15/20 1306    Visit Number 15    Number of Visits 18    Date for PT Re-Evaluation 02/20/20    Authorization Type [employer]    Progress Note Due on Visit 20    PT Start Time 1302    PT Stop Time 1343    PT Time Calculation (min) 41 min    Activity Tolerance Patient tolerated treatment well    Behavior During Therapy [facility] for tasks assessed/performed           Past Medical History:  Diagnosis Date  . Allergic rhinitis   . Arthritis HIPS/ WRISTS  . Cancer ([place])   . DDD (degenerative disc disease), cervical   . GERD (gastroesophageal reflux disease) OCCASIONAL  . Heart murmur    turbulent flow through LVOT, no obstruction 08/26/18 echo  . History of Bell's palsy 2011  RIGHT SIDE -- RESOLVED  . 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 History of CVA (cerebrovascular accident) PER SCAN IN 2011  . Hyperlipidemia   . Hypertension   . Impaired fasting glucose PER PCP  DR SIMPSON   WATCH DIET  . Mixed urge and stress incontinence   . Nocturia   . Sinus drainage   . Thoracic ascending aortic aneurysm ([place]) LAST CHEST CT 09-02-2010--  FOLLOWED BY  CARDIOLOGIST--  DR [id number]  . Vaginal wall prolapse     Past Surgical History:  Procedure Laterality Date  . ABDOMINAL HYSTERECTOMY    . BILATERAL CARPAL TUNNEL RELEASE  1980'S  . CARDIAC CATHETERIZATION  02-01-2009  DR EICHHORN   NORMAL CORONARY ARTERIES/ NORMAL LV SIZE AND FUNCTION/ AORTIC ROOT SIZE AT THE UPPER LIMIT OF NORMAL   . CARPAL TUNNEL RELEASE    . CERVICAL DISC SURGERY  12-12-2005  DR Vertell Limber   LEFT  C6  - C7 HERINATED / DDD/ SPONDYLOSIS  . COLONOSCOPY N/A 02/10/2014   Procedure: COLONOSCOPY;  Surgeon: Danie Binder, MD;  [facility];  Service: Endoscopy;  Laterality: N/A;  8:30  . CYSTOCELE REPAIR  08/02/2012   Procedure: ANTERIOR REPAIR (CYSTOCELE);  Surgeon: Ailene Rud, MD;  Location: [facility];  Service: Urology;  Laterality: N/A;  [employer] Uphold Anterior Pelvic Floor Sacrospinus Repair. Anterior wall of the vagina.  . ESOPHAGOGASTRODUODENOSCOPY N/A 02/10/2014   Procedure: ESOPHAGOGASTRODUODENOSCOPY (EGD);  Surgeon: Danie Binder, MD;  [facility];  Service: Endoscopy;  Laterality: N/A;  . GIVENS CAPSULE STUDY N/A 03/13/2014   Procedure: GIVENS CAPSULE STUDY;  Surgeon: Danie Binder, MD;  [facility];  Service: Endoscopy;  Laterality: N/A;  7:30  . NECK SURGERY  2007  . RIGHT PARTIAL MASTECTOMY  11-11-2004  DR [name] YOUNG   DUCTAL CARCINOMA IN SITU RIGHT BREAST  . TOTAL ABDOMINAL HYSTERECTOMY W/ BILATERAL SALPINGOOPHORECTOMY  1990  . TOTAL KNEE ARTHROPLASTY Right 12/21/2019   Procedure: RIGHT  TOTAL KNEE ARTHROPLASTY;  Surgeon: [name], MD;  Location: [place];  Service: Orthopedics;  Laterality: Right;  . TRANSTHORACIC ECHOCARDIOGRAM  03-30-2008  DR MARGARET SIMPSON   LV SIZE  AND FUNCTION NORMAL/ MODERATE AORTIC ARCH DILATATION/ MILD MR    There were no vitals filed for this visit.   Subjective Assessment - 02/15/20 1301    Subjective Patient states her knee is sore as usual. She has been doing her exercises.    Pertinent History Cancer, chronic knee pain    Limitations Sitting;Lifting;Standing;Walking;House hold activities    How long can you walk comfortably? 10 minutes was 5 minutes    Patient Stated Goals Improve function    Currently in Pain? No/denies    Pain Onset 1 to 4 weeks ago                             [facility] Adult PT Treatment/Exercise - 02/15/20 0001      Knee/Hip  Exercises: Aerobic   Stationary Bike seat 9 rocking 4 minutes for ROM      Knee/Hip Exercises: Machines for Strengthening   Other Machine leg press 3x10 30#      Knee/Hip Exercises: Standing   Lateral Step Up 10 reps;Hand Hold: 2;Step Height: 4";3 sets    Lateral Step Up Limitations eccentric control      Knee/Hip Exercises: Seated   Sit to Sand 10 reps;without UE support      Knee/Hip Exercises: Supine   Straight Leg Raises 2 sets;10 reps    Straight Leg Raises Limitations with quad set for TKE strength 2#    Knee Extension AROM    Knee Extension Limitations 1    Knee Flexion AROM    Knee Flexion Limitations 114                  PT Education - 02/15/20 1308    Education Details Review of HEP, continuing HEP, mechanics of exercises, knee resting positions for improving motion    Person(s) Educated Patient    Methods Explanation;Demonstration    Comprehension Verbalized understanding;Returned demonstration            PT Short Term Goals - 01/30/20 1324      PT SHORT TERM GOAL #1   Title Patient will be independent with HEP in order to improve functional outcomes.    Time 3    Period Weeks    Status On-going    Target Date 01/30/20      PT SHORT TERM GOAL #2   Title Patient will report at least 25% improvement in symptoms for improved quality of life.    Baseline 80% better    Time 3    Period Weeks    Status Achieved    Target Date 01/30/20             PT Long Term Goals - 01/30/20 1325      PT LONG TERM GOAL #1   Title Patient will report at least 75% improvement in symptoms for improved quality of life.    Baseline 80% better    Time 6    Period Weeks    Status Achieved      PT LONG TERM GOAL #2   Title Patient will improve FOTO score by at least 10 points in order to indicate improved tolerance to activity.    Time 6    Period Weeks    Status Achieved      PT LONG TERM GOAL #3   Title Patient will be able to ambulate for at least 30  minutes with pain no greater than 1/10 in order to demonstrate  improved ability to ambulate in the community.    Baseline 5-10 minutes    Time 6    Period Weeks    Status On-going      PT LONG TERM GOAL #4   Title Patient will be able to complete 5x STS in under 11.4 seconds in order to reduce the risk of falls.    Baseline 16.9 seconds    Time 6    Period Weeks    Status On-going                 Plan - 02/15/20 1308    Clinical Impression Statement Patient shown resting positions for relaxing at home for increasing flexion/extension ROM. Educated on heel prop for improving extension ROM. She is able to complete leg press with good eccentric control. She demonstrates impaired eccentric control with lateral step down exercise and requires bilateral UE support to assist in decent. She also requires cueing for knee flexion rather than weight shifting. Discussed with patient ending therapy at end of cert vs. continuing POC. Patient will continue to benefit from skilled physical therapy in order to improve function and reduce impairment.    Personal Factors and Comorbidities Age;Behavior Pattern;Time since onset of injury/illness/exacerbation;Fitness;Comorbidity 3+    Comorbidities obesity, HTN, chronic knee pain    Examination-Activity Limitations Bathing;Bed Mobility;Bend;Lift;Locomotion Level;Sit;Sleep;Squat;Stairs;Stand;Transfers    Examination-Participation Restrictions Church;Cleaning;Driving;Laundry;Meal Prep;Volunteer;Yard Work    Stability/Clinical Decision Making Stable/Uncomplicated    Rehab Potential Good    PT Frequency 3x / week    PT Duration 6 weeks    PT Treatment/Interventions ADLs/Self Care Home Management;Aquatic Therapy;Biofeedback;Cryotherapy;Electrical Stimulation;Iontophoresis 4mg /ml Dexamethasone;Traction;Moist Heat;Ultrasound;Parrafin;Fluidtherapy;Contrast Bath;DME Instruction;Gait training;Stair training;Functional mobility training;Therapeutic  activities;Therapeutic exercise;Balance training;Neuromuscular re-education;Patient/family education;Orthotic Fit/Training;Manual techniques;Compression bandaging;Scar mobilization;Passive range of motion;Dry needling;Energy conservation;Splinting;Taping;Vasopneumatic Device;Spinal Manipulations;Joint Manipulations    PT Next Visit Plan Primary focus on improving knee flexion to WNL, funtional strengthening, stability and gait using LRAD.  Add static balance actvities next session.  Continue with myofascial techniques to reduce scar tissue.    PT Home Exercise Plan 5/19 Heel slides in seated, heel slides in supine, quad sets, clams, LAQ; 5/20 gait training with quad cane at home 5/24 sit to stands    Consulted and Agree with Plan of Care Patient           Patient will benefit from skilled therapeutic intervention in order to improve the following deficits and impairments:  Abnormal gait, Decreased activity tolerance, Decreased balance, Decreased mobility, Decreased endurance, Decreased scar mobility, Decreased range of motion, Decreased strength, Difficulty walking, Hypomobility, Increased muscle spasms, Impaired perceived functional ability, Improper body mechanics, Obesity, Pain  Visit Diagnosis: Right knee pain, unspecified chronicity  Other abnormalities of gait and mobility  Stiffness of right knee, not elsewhere classified  Muscle weakness (generalized)     Problem List Patient Active Problem List   Diagnosis Date Noted  . S/P TKR (total knee replacement), right 12/21/2019  . Pre-operative clearance 11/10/2019  . Acute cystitis with hematuria 11/10/2019  . Constipation 11/10/2019  . Fatigue 10/18/2019  . Left shoulder pain 04/11/2019  . Ganglion cyst of volar aspect of left wrist 10/05/2018  . Fall at home, subsequent encounter 06/07/2018  . Knee pain, right 05/31/2017  . CKD (chronic kidney disease) stage 3, GFR 30-59 ml/min 10/01/2016  . Neck pain on left side 01/01/2016   . Annual physical exam 02/15/2015  . 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 Urinary incontinence 05/22/2014  . Metabolic syndrome X 16/05/9603  . Osteopenia 12/12/2013  .  Anemia 11/29/2013  . Obesity (BMI 30-39.9) 10/17/2012  . Thoracic aortic aneurysm ([place]) 10/12/2012  . Allergic rhinitis 03/02/2012  . BLADDER PROLAPSE 09/24/2010  . NEOPLASM, MALIGNANT, BREAST, RIGHT 02/04/2008  . Hyperlipidemia LDL goal <100 02/04/2008  . Essential hypertension 02/04/2008    1:44 PM, 02/15/20 Mearl Latin PT, DPT Physical Therapist at [address] [place], [place], [address] Phone: [phone]   Fax:  [phone]  Name: DIDI GANAWAY MRN: [id number] Date of Birth: August 20, 1946

## 2020-02-20 ENCOUNTER — Encounter: Payer: Self-pay | Admitting: Family Medicine

## 2020-02-20 ENCOUNTER — Other Ambulatory Visit: Payer: Self-pay

## 2020-02-20 ENCOUNTER — Ambulatory Visit (INDEPENDENT_AMBULATORY_CARE_PROVIDER_SITE_OTHER): Payer: PPO | Admitting: Family Medicine

## 2020-02-20 ENCOUNTER — Other Ambulatory Visit: Payer: Self-pay | Admitting: *Deleted

## 2020-02-20 ENCOUNTER — Ambulatory Visit (HOSPITAL_COMMUNITY): Payer: PPO | Admitting: Physical Therapy

## 2020-02-20 ENCOUNTER — Encounter (HOSPITAL_COMMUNITY): Payer: Self-pay | Admitting: Physical Therapy

## 2020-02-20 VITALS — BP 120/80 | HR 69 | Temp 97.6°F | Resp 16 | Ht 62.0 in | Wt 187.0 lb

## 2020-02-20 DIAGNOSIS — E669 Obesity, unspecified: Secondary | ICD-10-CM

## 2020-02-20 DIAGNOSIS — D649 Anemia, unspecified: Secondary | ICD-10-CM

## 2020-02-20 DIAGNOSIS — E785 Hyperlipidemia, unspecified: Secondary | ICD-10-CM | POA: Diagnosis not present

## 2020-02-20 DIAGNOSIS — I10 Essential (primary) hypertension: Secondary | ICD-10-CM | POA: Diagnosis not present

## 2020-02-20 DIAGNOSIS — M25561 Pain in right knee: Secondary | ICD-10-CM | POA: Diagnosis not present

## 2020-02-20 DIAGNOSIS — M6281 Muscle weakness (generalized): Secondary | ICD-10-CM

## 2020-02-20 DIAGNOSIS — R2689 Other abnormalities of gait and mobility: Secondary | ICD-10-CM

## 2020-02-20 DIAGNOSIS — M25661 Stiffness of right knee, not elsewhere classified: Secondary | ICD-10-CM

## 2020-02-20 MED ORDER — LOVASTATIN 40 MG PO TABS: 40.0000 mg | ORAL_TABLET | Freq: Every day | ORAL | 2 refills | Status: DC

## 2020-02-20 MED ORDER — IBUPROFEN 800 MG PO TABS: ORAL_TABLET | ORAL | 0 refills | Status: DC

## 2020-02-20 NOTE — Assessment & Plan Note (Signed)
Controlled, no change in medication Hyperlipidemia:Low fat diet discussed and encouraged.   Lipid Panel  Lab Results  Component Value Date   CHOL 190 10/18/2019   HDL 80 10/18/2019   LDLCALC 96 10/18/2019   TRIG 59 10/18/2019   CHOLHDL 2.4 10/18/2019

## 2020-02-20 NOTE — Assessment & Plan Note (Signed)
Updated lab in November, has post op anemia, taking slow iron

## 2020-02-20 NOTE — Progress Notes (Signed)
Desiree [name]     MRN: [id number]      DOB: Apr 26, 1946   HPI Desiree [name] is here for follow up and re-evaluation of chronic medical conditions, medication management and review of any available recent lab and radiology data.  Preventive health is updated, specifically  Cancer screening and Immunization.   Desiree [name] had right knee replacement on April 28 and is recovering slowly but surely.  She is in outpatient physical therapy number of visits per week reduced from 4-3.  She still experiences pain in the knee rated at a 4 at bedtime which is makes it difficult for her to sleep.  She uses Tylenol and topical rubs with little relief.  At times she feels a little bit depressed that she has to have someone to drive her and has lost her independence temporarily, she is however heartened by the fact that this will improve as her knee recovers.  She does complain of mild urinary incontinence however states medication prescribed as not benefit her so she has discontinued this.  ROS Denies recent fever or chills. Denies sinus pressure, nasal congestion, ear pain or sore throat. Denies chest congestion, productive cough or wheezing. Denies chest pains, palpitations and leg swelling Denies abdominal pain, nausea, vomiting,diarrhea or constipation.   Denies dysuria, frequency, hesitancy . ia. Denies skin break down or rash.   PE BP 120/80   Pulse 69   Temp 97.6 F (36.4 C) (Temporal)   Resp 16   Ht 5\' 2"  (1.575 m)   Wt 187 lb (84.8 kg)   SpO2 99%   BMI 34.20 kg/m    Patient alert and oriented and in no cardiopulmonary distress.  HEENT: No facial asymmetry, EOMI,     Neck supple .  Chest: Clear to auscultation bilaterally.  CVS: S1, S2 no murmurs, no S3.Regular rate.  ABD: Soft non tender.   Ext: No edema  MS: Adequate ROM spine, shoulders, hips and reduced in right knee which is mildly swollen Skin: Intact, no ulcerations or rash noted. Excellent healing at op  site Psych: Good eye contact, normal affect. Memory intact not anxious or depressed appearing.  CNS: CN 2-12 intact, power,  normal throughout.no focal deficits noted.   Assessment & Plan  Essential hypertension Controlled, no change in medication DASH diet and commitment to daily physical activity for a minimum of 30 minutes discussed and encouraged, as a part of hypertension management. The importance of attaining a healthy weight is also discussed.  BP/Weight 02/20/2020 02/09/2020 01/31/2020 01/05/2020 12/23/2019 12/21/2019 7/74/1287  Systolic BP 867 672 094 709 628 - 366  Diastolic BP 80 71 70 83 65 - 62  Wt. (Lbs) 187 188 188.6 - - 190 193.5  BMI 34.2 35.52 34.5 - - 35.32 35.97       Hyperlipidemia LDL goal <100 Controlled, no change in medication Hyperlipidemia:Low fat diet discussed and encouraged.   Lipid Panel  Lab Results  Component Value Date   CHOL 190 10/18/2019   HDL 80 10/18/2019   LDLCALC 96 10/18/2019   TRIG 59 10/18/2019   CHOLHDL 2.4 10/18/2019       Obesity (BMI 30-39.9)  Patient re-educated about  the importance of commitment to a  minimum of 150 minutes of exercise per week as able.  The importance of healthy food choices with portion control discussed, as well as eating regularly and within a 12 hour window most days. The need to choose "clean , green" food 50 to 75% of the  time is discussed, as well as to make water the primary drink and set a goal of 64 ounces water daily.    Weight /BMI 02/20/2020 02/09/2020 01/31/2020  WEIGHT 187 lb 188 lb 188 lb 9.6 oz  HEIGHT 5\' 2"  5\' 1"  5\' 2"   BMI 34.2 kg/m2 35.52 kg/m2 34.5 kg/m2      Anemia Updated lab in November, has post op anemia, taking slow iron

## 2020-02-20 NOTE — Patient Instructions (Addendum)
Keep wellness appt  As scheduled  Call and come in August for flu vaccine  Annual physical; exam  With MD in Feb 24 or after, when due, call if you need me sooner  Please get fasting lipid , cmp and EGFr and CBC 2nd week in September  Excellent blood pressure  Thankful surgery on knee went well  Start ibuprofen 800 mg at bedtime , max 4 times per week. OK to take ES tylenol every day  It is important that you exercise regularly at least 30 minutes 5 times a week. If you develop chest pain, have severe difficulty breathing, or feel very tired, stop exercising immediately and seek medical attention   Think about what you will eat, plan ahead. Choose " clean, green, fresh or frozen" over canned, processed or packaged foods which are more sugary, salty and fatty. 70 to 75% of food eaten should be vegetables and fruit. Three meals at set times with snacks allowed between meals, but they must be fruit or vegetables. Aim to eat over a 12 hour period , example 7 am to 7 pm, and STOP after  your last meal of the day. Drink water,generally about 64 ounces per day, no other drink is as healthy. Fruit juice is best enjoyed in a healthy way, by EATING the fruit. Thanks for choosing [facility], we consider it a privelige to serve you.

## 2020-02-20 NOTE — Therapy (Signed)
[place], [place], [address] Phone: [phone]   Fax:  [phone]  Physical Therapy Treatment/Progress note/Recert  Patient Details  Name: Desiree [name] MRN: [id number] Date of Birth: 07-19-1946 Referring Provider (PT): Rodell Perna MD   Encounter Date: 02/20/2020  Progress Note   Reporting Period 01/30/20 to 02/20/20   See note below for Objective Data and Assessment of Progress/Goals    PT End of Session - 02/20/20 1448    Visit Number 16    Number of Visits 26    Date for PT Re-Evaluation 03/19/20    Authorization Type [employer]    Progress Note Due on Visit 26    PT Start Time 1435    PT Stop Time 1515    PT Time Calculation (min) 40 min    Activity Tolerance Patient tolerated treatment well    Behavior During Therapy [facility] for tasks assessed/performed           Past Medical History:  Diagnosis Date  . Allergic rhinitis   . Arthritis HIPS/ WRISTS  . Cancer ([place])   . DDD (degenerative disc disease), cervical   . GERD (gastroesophageal reflux disease) OCCASIONAL  . Heart murmur    turbulent flow through LVOT, no obstruction 08/26/18 echo  . History of Bell's palsy 2011  RIGHT SIDE -- RESOLVED  . 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 History of CVA (cerebrovascular accident) PER SCAN IN 2011  . Hyperlipidemia   . Hypertension   . Impaired fasting glucose PER PCP  DR SIMPSON   WATCH DIET  . Mixed urge and stress incontinence   . Nocturia   . Sinus drainage   . Thoracic ascending aortic aneurysm ([place]) LAST CHEST CT 09-02-2010--  FOLLOWED BY  CARDIOLOGIST--  DR [id number]  . Vaginal wall prolapse     Past Surgical History:  Procedure Laterality Date  . ABDOMINAL HYSTERECTOMY    . BILATERAL CARPAL TUNNEL RELEASE  1980'S  . CARDIAC CATHETERIZATION  02-01-2009  DR EICHHORN   NORMAL CORONARY ARTERIES/ NORMAL LV SIZE  AND FUNCTION/ AORTIC ROOT SIZE AT THE UPPER LIMIT OF NORMAL   . CARPAL TUNNEL RELEASE    . CERVICAL DISC SURGERY  12-12-2005  DR Vertell Limber   LEFT  C6 - C7 HERINATED / DDD/ SPONDYLOSIS  . COLONOSCOPY N/A 02/10/2014   Procedure: COLONOSCOPY;  Surgeon: Danie Binder, MD;  [facility];  Service: Endoscopy;  Laterality: N/A;  8:30  . CYSTOCELE REPAIR  08/02/2012   Procedure: ANTERIOR REPAIR (CYSTOCELE);  Surgeon: Ailene Rud, MD;  Location: [facility];  Service: Urology;  Laterality: N/A;  [employer] Uphold Anterior Pelvic Floor Sacrospinus Repair. Anterior wall of the vagina.  . ESOPHAGOGASTRODUODENOSCOPY N/A 02/10/2014   Procedure: ESOPHAGOGASTRODUODENOSCOPY (EGD);  Surgeon: Danie Binder, MD;  [facility];  Service: Endoscopy;  Laterality: N/A;  . GIVENS CAPSULE STUDY N/A 03/13/2014   Procedure: GIVENS CAPSULE STUDY;  Surgeon: Danie Binder, MD;  [facility];  Service: Endoscopy;  Laterality: N/A;  7:30  . NECK SURGERY  2007  . RIGHT PARTIAL MASTECTOMY  11-11-2004  DR [name] YOUNG   DUCTAL CARCINOMA IN SITU RIGHT BREAST  . TOTAL ABDOMINAL HYSTERECTOMY W/ BILATERAL SALPINGOOPHORECTOMY  1990  . TOTAL KNEE ARTHROPLASTY Right 12/21/2019   Procedure: RIGHT  TOTAL KNEE ARTHROPLASTY;  Surgeon: [name], MD;  Location: [place];  Service: Orthopedics;  Laterality: Right;  . TRANSTHORACIC ECHOCARDIOGRAM  03-30-2008  DR MARGARET SIMPSON   LV SIZE AND FUNCTION NORMAL/ MODERATE AORTIC ARCH DILATATION/ MILD MR    There were no vitals filed for this visit.   Subjective Assessment - 02/20/20 1436    Subjective Patient states her knee is still sore. Exercises are going well. Patient states 75% improvement since beginning physical therapy. She feels limited by still having to use her cane and balance. Patient states she can ambulate for about 20 minutes at a time before fatigue sets in. She was able to cook dinner yesterday for herself.     Pertinent History Cancer, chronic knee pain    Limitations Sitting;Lifting;Standing;Walking;House hold activities    How long can you walk comfortably? 20 minutes was 10 minutes was 5 minutes    Patient Stated Goals Improve function    Currently in Pain? No/denies    Pain Onset 1 to 4 weeks ago              [facility] PT Assessment - 02/20/20 0001      Assessment   Medical Diagnosis R TKA    Referring Provider (PT) Rodell Perna MD    Onset Date/Surgical Date 12/21/19    Next MD Visit 03/15/20    Prior Therapy HHPT and hx knee rehab      Precautions   Precautions None      Restrictions   Weight Bearing Restrictions No      Balance Screen   Has the patient fallen in the past 6 months No    Has the patient had a decrease in activity level because of a fear of falling?  No    Is the patient reluctant to leave their home because of a fear of falling?  No      Prior Function   Level of Independence Independent      Cognition   Overall Cognitive Status Within Functional Limits for tasks assessed      Observation/Other Assessments   Focus on Therapeutic Outcomes (FOTO)  51% limited      AROM   Right Knee Extension 1   lacking   Right Knee Flexion 114      Strength   Right Hip Flexion 4+/5    Left Hip Flexion 4+/5    Right Knee Flexion 4+/5    Right Knee Extension 4+/5    Left Knee Flexion 5/5    Left Knee Extension 5/5    Right Ankle Dorsiflexion 5/5    Left Ankle Dorsiflexion 5/5      Transfers   Five time sit to stand comments  14.07 seconds                         [facility] Adult PT Treatment/Exercise - 02/20/20 0001      Knee/Hip Exercises: Aerobic   Stationary Bike seat 5 rocking 4 minutes for ROM      Knee/Hip Exercises: Machines for Strengthening   Other Machine leg press 4x10 30#; 10# 2x 10      Knee/Hip Exercises: Supine   Knee Extension AROM    Knee Extension Limitations 1    Knee Flexion AROM    Knee Flexion Limitations 114                   PT Education - 02/20/20 1447    Education Details Review of HEP, continuing HEP, mechanics of exercises  Person(s) Educated Patient    Methods Explanation;Demonstration    Comprehension Verbalized understanding;Returned demonstration            PT Short Term Goals - 02/20/20 1451      PT SHORT TERM GOAL #1   Title Patient will be independent with HEP in order to improve functional outcomes.    Time 3    Period Weeks    Status Achieved    Target Date 01/30/20      PT SHORT TERM GOAL #2   Title Patient will report at least 25% improvement in symptoms for improved quality of life.    Baseline 80% better    Time 3    Period Weeks    Status Achieved    Target Date 01/30/20             PT Long Term Goals - 02/20/20 1451      PT LONG TERM GOAL #1   Title Patient will report at least 75% improvement in symptoms for improved quality of life.    Baseline 80% better    Time 6    Period Weeks    Status Achieved      PT LONG TERM GOAL #2   Title Patient will improve FOTO score by at least 10 points in order to indicate improved tolerance to activity.    Time 6    Period Weeks    Status Achieved      PT LONG TERM GOAL #3   Title Patient will be able to ambulate for at least 30 minutes with pain no greater than 1/10 in order to demonstrate improved ability to ambulate in the community.    Baseline 5-10 minutes    Time 6    Period Weeks    Status On-going      PT LONG TERM GOAL #4   Title Patient will be able to complete 5x STS in under 11.4 seconds in order to reduce the risk of falls.    Baseline 16.9 seconds; 14.07 seconds    Time 6    Period Weeks    Status On-going                 Plan - 02/20/20 1450    Clinical Impression Statement Patient has met 2/2 short term goals with improving symptoms and ability to complete HEP. She has met 2/4 long term goals with decrease in symptoms and improved activity tolerance. Patient remains limited  with ambulation time and functional strength. She continues to lack end range motion, strength, functional mobility. Patient fearful of injury/falling which limits ADLs. Patient showing improving strength and ROM and is making slow progress. Patient tolerates increased reps of leg press today.  Patient will continue to benefit from skilled physical therapy in order to reduce impairment and improve function.    Personal Factors and Comorbidities Age;Behavior Pattern;Time since onset of injury/illness/exacerbation;Fitness;Comorbidity 3+    Comorbidities obesity, HTN, chronic knee pain    Examination-Activity Limitations Bathing;Bed Mobility;Bend;Lift;Locomotion Level;Sit;Sleep;Squat;Stairs;Stand;Transfers    Examination-Participation Restrictions Church;Cleaning;Driving;Laundry;Meal Prep;Volunteer;Yard Work    Stability/Clinical Decision Making Stable/Uncomplicated    Rehab Potential Good    PT Frequency 2x / week    PT Duration 4 weeks    PT Treatment/Interventions ADLs/Self Care Home Management;Aquatic Therapy;Biofeedback;Cryotherapy;Electrical Stimulation;Iontophoresis 25m/ml Dexamethasone;Traction;Moist Heat;Ultrasound;Parrafin;Fluidtherapy;Contrast Bath;DME Instruction;Gait training;Stair training;Functional mobility training;Therapeutic activities;Therapeutic exercise;Balance training;Neuromuscular re-education;Patient/family education;Orthotic Fit/Training;Manual techniques;Compression bandaging;Scar mobilization;Passive range of motion;Dry needling;Energy conservation;Splinting;Taping;Vasopneumatic Device;Spinal Manipulations;Joint Manipulations    PT Next Visit Plan Primary focus on improving knee  flexion to WNL, funtional strengthening, stability and gait using LRAD.  Add static balance actvities next session.  Continue with myofascial techniques to reduce scar tissue.    PT Home Exercise Plan 5/19 Heel slides in seated, heel slides in supine, quad sets, clams, LAQ; 5/20 gait training with quad  cane at home 5/24 sit to stands    Consulted and Agree with Plan of Care Patient           Patient will benefit from skilled therapeutic intervention in order to improve the following deficits and impairments:  Abnormal gait, Decreased activity tolerance, Decreased balance, Decreased mobility, Decreased endurance, Decreased scar mobility, Decreased range of motion, Decreased strength, Difficulty walking, Hypomobility, Increased muscle spasms, Impaired perceived functional ability, Improper body mechanics, Obesity, Pain  Visit Diagnosis: Right knee pain, unspecified chronicity  Other abnormalities of gait and mobility  Stiffness of right knee, not elsewhere classified  Muscle weakness (generalized)     Problem List Patient Active Problem List   Diagnosis Date Noted  . S/P TKR (total knee replacement), right 12/21/2019  . Constipation 11/10/2019  . Fatigue 10/18/2019  . Left shoulder pain 04/11/2019  . Ganglion cyst of volar aspect of left wrist 10/05/2018  . Knee pain, right 05/31/2017  . Neck pain on left side 01/01/2016  . 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 Metabolic syndrome X 00/37/9444  . Osteopenia 12/12/2013  . Anemia 11/29/2013  . Obesity (BMI 30-39.9) 10/17/2012  . Thoracic aortic aneurysm ([place]) 10/12/2012  . Allergic rhinitis 03/02/2012  . BLADDER PROLAPSE 09/24/2010  . NEOPLASM, MALIGNANT, BREAST, RIGHT 02/04/2008  . Hyperlipidemia LDL goal <100 02/04/2008  . Essential hypertension 02/04/2008    3:17 PM, 02/20/20 Mearl Latin PT, DPT Physical Therapist at [place], [place], [address] Phone: [phone]   Fax:  [phone]  Name: MAZIYAH VESSEL MRN: [id number] Date of Birth: Nov 06, 1945

## 2020-02-20 NOTE — Assessment & Plan Note (Signed)
  Patient re-educated about  the importance of commitment to a  minimum of 150 minutes of exercise per week as able.  The importance of healthy food choices with portion control discussed, as well as eating regularly and within a 12 hour window most days. The need to choose "clean , green" food 50 to 75% of the time is discussed, as well as to make water the primary drink and set a goal of 64 ounces water daily.    Weight /BMI 02/20/2020 02/09/2020 01/31/2020  WEIGHT 187 lb 188 lb 188 lb 9.6 oz  HEIGHT 5\' 2"  5\' 1"  5\' 2"   BMI 34.2 kg/m2 35.52 kg/m2 34.5 kg/m2

## 2020-02-20 NOTE — Assessment & Plan Note (Signed)
Controlled, no change in medication DASH diet and commitment to daily physical activity for a minimum of 30 minutes discussed and encouraged, as a part of hypertension management. The importance of attaining a healthy weight is also discussed.  BP/Weight 02/20/2020 02/09/2020 01/31/2020 01/05/2020 12/23/2019 12/21/2019 10/04/3126  Systolic BP 118 867 737 366 815 - 947  Diastolic BP 80 71 70 83 65 - 62  Wt. (Lbs) 187 188 188.6 - - 190 193.5  BMI 34.2 35.52 34.5 - - 35.32 35.97

## 2020-02-22 ENCOUNTER — Ambulatory Visit (HOSPITAL_COMMUNITY): Payer: PPO | Admitting: Physical Therapy

## 2020-02-22 ENCOUNTER — Other Ambulatory Visit: Payer: Self-pay

## 2020-02-22 DIAGNOSIS — M25561 Pain in right knee: Secondary | ICD-10-CM | POA: Diagnosis not present

## 2020-02-22 DIAGNOSIS — G8929 Other chronic pain: Secondary | ICD-10-CM

## 2020-02-22 DIAGNOSIS — R2689 Other abnormalities of gait and mobility: Secondary | ICD-10-CM

## 2020-02-22 DIAGNOSIS — M6281 Muscle weakness (generalized): Secondary | ICD-10-CM

## 2020-02-22 DIAGNOSIS — M25661 Stiffness of right knee, not elsewhere classified: Secondary | ICD-10-CM

## 2020-02-22 NOTE — Therapy (Signed)
[place], [place], [address] Phone: [phone]   Fax:  [phone]  Physical Therapy Treatment  Patient Details  Name: Desiree [name] MRN: [id number] Date of Birth: 1946/07/07 Referring Provider (PT): Rodell Perna MD   Encounter Date: 02/22/2020   PT End of Session - 02/22/20 1445    Visit Number 17    Number of Visits 26    Date for PT Re-Evaluation 03/19/20    Progress Note Due on Visit 26    PT Start Time 1315    PT Stop Time 1400    PT Time Calculation (min) 45 min           Past Medical History:  Diagnosis Date  . Allergic rhinitis   . Arthritis HIPS/ WRISTS  . Cancer ([place])   . DDD (degenerative disc disease), cervical   . GERD (gastroesophageal reflux disease) OCCASIONAL  . Heart murmur    turbulent flow through LVOT, no obstruction 08/26/18 echo  . History of Bell's palsy 2011  RIGHT SIDE -- RESOLVED  . 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 History of CVA (cerebrovascular accident) PER SCAN IN 2011  . Hyperlipidemia   . Hypertension   . Impaired fasting glucose PER PCP  DR SIMPSON   WATCH DIET  . Mixed urge and stress incontinence   . Nocturia   . Sinus drainage   . Thoracic ascending aortic aneurysm ([place]) LAST CHEST CT 09-02-2010--  FOLLOWED BY  CARDIOLOGIST--  DR [id number]  . Vaginal wall prolapse     Past Surgical History:  Procedure Laterality Date  . ABDOMINAL HYSTERECTOMY    . BILATERAL CARPAL TUNNEL RELEASE  1980'S  . CARDIAC CATHETERIZATION  02-01-2009  DR EICHHORN   NORMAL CORONARY ARTERIES/ NORMAL LV SIZE AND FUNCTION/ AORTIC ROOT SIZE AT THE UPPER LIMIT OF NORMAL   . CARPAL TUNNEL RELEASE    . CERVICAL DISC SURGERY  12-12-2005  DR Vertell Limber   LEFT  C6 - C7 HERINATED / DDD/ SPONDYLOSIS  . COLONOSCOPY N/A 02/10/2014   Procedure: COLONOSCOPY;  Surgeon: Danie Binder, MD;  [facility];  Service:  Endoscopy;  Laterality: N/A;  8:30  . CYSTOCELE REPAIR  08/02/2012   Procedure: ANTERIOR REPAIR (CYSTOCELE);  Surgeon: Ailene Rud, MD;  Location: [facility];  Service: Urology;  Laterality: N/A;  [employer] Uphold Anterior Pelvic Floor Sacrospinus Repair. Anterior wall of the vagina.  . ESOPHAGOGASTRODUODENOSCOPY N/A 02/10/2014   Procedure: ESOPHAGOGASTRODUODENOSCOPY (EGD);  Surgeon: Danie Binder, MD;  [facility];  Service: Endoscopy;  Laterality: N/A;  . GIVENS CAPSULE STUDY N/A 03/13/2014   Procedure: GIVENS CAPSULE STUDY;  Surgeon: Danie Binder, MD;  [facility];  Service: Endoscopy;  Laterality: N/A;  7:30  . NECK SURGERY  2007  . RIGHT PARTIAL MASTECTOMY  11-11-2004  DR [name] YOUNG   DUCTAL CARCINOMA IN SITU RIGHT BREAST  . TOTAL ABDOMINAL HYSTERECTOMY W/ BILATERAL SALPINGOOPHORECTOMY  1990  . TOTAL KNEE ARTHROPLASTY Right 12/21/2019   Procedure: RIGHT  TOTAL KNEE ARTHROPLASTY;  Surgeon: [name], MD;  Location: [place];  Service: Orthopedics;  Laterality: Right;  . TRANSTHORACIC ECHOCARDIOGRAM  03-30-2008  DR MARGARET SIMPSON   LV SIZE AND FUNCTION NORMAL/ MODERATE AORTIC ARCH DILATATION/ MILD MR    There were no vitals filed for this visit.   Subjective Assessment - 02/22/20  1342    Subjective pt states she is doing well today with a little bit of discomfort today.  Continues to use her [facility] for ambulation.    Currently in Pain? No/denies                             [facility] Adult PT Treatment/Exercise - 02/22/20 0001      Knee/Hip Exercises: Aerobic   Stationary Bike seat 5 rocking 4 minutes for ROM      Knee/Hip Exercises: Machines for Strengthening   Other Machine leg press 4x10 30#; 10# 2x 10      Knee/Hip Exercises: Standing   Heel Raises Both;15 reps    Knee Flexion Right;15 reps    Lateral Step Up 10 reps;Hand Hold: 2;Step Height: 4";3 sets    Lateral Step Up Limitations eccentric control     Gait Training no AD 226 feet    Other Standing Knee Exercises tandem stance on ground 2x30" B, SLS 2x 30 seconds bilateral       Knee/Hip Exercises: Seated   Sit to Sand 10 reps;without UE support      Knee/Hip Exercises: Supine   Knee Extension AROM    Knee Extension Limitations 0    Knee Flexion AROM    Knee Flexion Limitations 114      Manual Therapy   Manual Therapy Myofascial release    Manual therapy comments completed at EOS    Myofascial Release to reduce scar tissue and improve ROM                    PT Short Term Goals - 02/20/20 1451      PT SHORT TERM GOAL #1   Title Patient will be independent with HEP in order to improve functional outcomes.    Time 3    Period Weeks    Status Achieved    Target Date 01/30/20      PT SHORT TERM GOAL #2   Title Patient will report at least 25% improvement in symptoms for improved quality of life.    Baseline 80% better    Time 3    Period Weeks    Status Achieved    Target Date 01/30/20             PT Long Term Goals - 02/20/20 1451      PT LONG TERM GOAL #1   Title Patient will report at least 75% improvement in symptoms for improved quality of life.    Baseline 80% better    Time 6    Period Weeks    Status Achieved      PT LONG TERM GOAL #2   Title Patient will improve FOTO score by at least 10 points in order to indicate improved tolerance to activity.    Time 6    Period Weeks    Status Achieved      PT LONG TERM GOAL #3   Title Patient will be able to ambulate for at least 30 minutes with pain no greater than 1/10 in order to demonstrate improved ability to ambulate in the community.    Baseline 5-10 minutes    Time 6    Period Weeks    Status On-going      PT LONG TERM GOAL #4   Title Patient will be able to complete 5x STS in under 11.4 seconds in order to reduce the risk of falls.  Baseline 16.9 seconds; 14.07 seconds    Time 6    Period Weeks    Status On-going                  Plan - 02/22/20 1447    Clinical Impression Statement Continued with focus on improving Rt knee flexion, balance and independence with gait using LRAD.  Pt hesitant to ambulate without SPC, however displays good cadence, UE swing and no antalgia without AD.  Pt does tend to "furniture walk" when going by objects in hallway.  No LOB or pain noted.  Pt with good stability in tandem with minimal challenge.  Manual completed at EOS with most tightness/scar tissue superior knee along scar line.  Only able to achieve 114 degrees flexion today.    Personal Factors and Comorbidities Age;Behavior Pattern;Time since onset of injury/illness/exacerbation;Fitness;Comorbidity 3+    Comorbidities obesity, HTN, chronic knee pain    Examination-Activity Limitations Bathing;Bed Mobility;Bend;Lift;Locomotion Level;Sit;Sleep;Squat;Stairs;Stand;Transfers    Examination-Participation Restrictions Church;Cleaning;Driving;Laundry;Meal Prep;Volunteer;Yard Work    Stability/Clinical Decision Making Stable/Uncomplicated    Rehab Potential Good    PT Frequency 2x / week    PT Duration 4 weeks    PT Treatment/Interventions ADLs/Self Care Home Management;Aquatic Therapy;Biofeedback;Cryotherapy;Electrical Stimulation;Iontophoresis 4mg /ml Dexamethasone;Traction;Moist Heat;Ultrasound;Parrafin;Fluidtherapy;Contrast Bath;DME Instruction;Gait training;Stair training;Functional mobility training;Therapeutic activities;Therapeutic exercise;Balance training;Neuromuscular re-education;Patient/family education;Orthotic Fit/Training;Manual techniques;Compression bandaging;Scar mobilization;Passive range of motion;Dry needling;Energy conservation;Splinting;Taping;Vasopneumatic Device;Spinal Manipulations;Joint Manipulations    PT Next Visit Plan Progress to higher level balance, dynamic tandem/retro and add foam to static tandem.  Continue to work on gait without AD.    PT Home Exercise Plan 5/19 Heel slides in seated, heel  slides in supine, quad sets, clams, LAQ; 5/20 gait training with quad cane at home 5/24 sit to stands    Consulted and Agree with Plan of Care Patient           Patient will benefit from skilled therapeutic intervention in order to improve the following deficits and impairments:  Abnormal gait, Decreased activity tolerance, Decreased balance, Decreased mobility, Decreased endurance, Decreased scar mobility, Decreased range of motion, Decreased strength, Difficulty walking, Hypomobility, Increased muscle spasms, Impaired perceived functional ability, Improper body mechanics, Obesity, Pain  Visit Diagnosis: Right knee pain, unspecified chronicity  Other abnormalities of gait and mobility  Stiffness of right knee, not elsewhere classified  Muscle weakness (generalized)  Chronic pain of right knee     Problem List Patient Active Problem List   Diagnosis Date Noted  . S/P TKR (total knee replacement), right 12/21/2019  . Constipation 11/10/2019  . Fatigue 10/18/2019  . Left shoulder pain 04/11/2019  . Ganglion cyst of volar aspect of left wrist 10/05/2018  . Knee pain, right 05/31/2017  . Neck pain on left side 01/01/2016  . 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 Metabolic syndrome X 17/61/6073  . Osteopenia 12/12/2013  . Anemia 11/29/2013  . Obesity (BMI 30-39.9) 10/17/2012  . Thoracic aortic aneurysm ([place]) 10/12/2012  . Allergic rhinitis 03/02/2012  . BLADDER PROLAPSE 09/24/2010  . NEOPLASM, MALIGNANT, BREAST, RIGHT 02/04/2008  . Hyperlipidemia LDL goal <100 02/04/2008  . Essential hypertension 02/04/2008   Teena Irani, PTA/CLT [phone]  Teena Irani 02/22/2020, 2:52 PM  [place] [address] [place], [place], [address] Phone: [phone]   Fax:  [phone]  Name: CHANNELL QUATTRONE MRN: [id number] Date of Birth: Feb 20, 1946

## 2020-02-28 ENCOUNTER — Ambulatory Visit (HOSPITAL_COMMUNITY): Payer: PPO | Attending: Orthopaedic Surgery | Admitting: Physical Therapy

## 2020-02-28 ENCOUNTER — Encounter (HOSPITAL_COMMUNITY): Payer: Self-pay | Admitting: Physical Therapy

## 2020-02-28 ENCOUNTER — Other Ambulatory Visit: Payer: Self-pay

## 2020-02-28 DIAGNOSIS — R2689 Other abnormalities of gait and mobility: Secondary | ICD-10-CM | POA: Diagnosis not present

## 2020-02-28 DIAGNOSIS — G8929 Other chronic pain: Secondary | ICD-10-CM | POA: Diagnosis not present

## 2020-02-28 DIAGNOSIS — M25561 Pain in right knee: Secondary | ICD-10-CM | POA: Diagnosis not present

## 2020-02-28 DIAGNOSIS — M25661 Stiffness of right knee, not elsewhere classified: Secondary | ICD-10-CM | POA: Insufficient documentation

## 2020-02-28 DIAGNOSIS — M6281 Muscle weakness (generalized): Secondary | ICD-10-CM | POA: Diagnosis not present

## 2020-02-29 ENCOUNTER — Ambulatory Visit (HOSPITAL_COMMUNITY)
Admission: RE | Admit: 2020-02-29 | Discharge: 2020-02-29 | Disposition: A | Discharge status: Home / Self Care | Payer: PPO | Source: Ambulatory Visit | Attending: Family Medicine | Admitting: Family Medicine

## 2020-02-29 ENCOUNTER — Other Ambulatory Visit: Payer: Self-pay

## 2020-02-29 DIAGNOSIS — Z1231 Encounter for screening mammogram for malignant neoplasm of breast: Secondary | ICD-10-CM | POA: Diagnosis not present

## 2020-02-29 MED ORDER — CARVEDILOL 3.125 MG PO TABS: 3.1250 mg | ORAL_TABLET | Freq: Two times a day (BID) | ORAL | 3 refills | Status: DC

## 2020-02-29 NOTE — Telephone Encounter (Signed)
Rx(s) sent to pharmacy electronically.  

## 2020-02-29 NOTE — Therapy (Signed)
[place], [place], [address] Phone: [phone]   Fax:  [phone]  Physical Therapy Treatment  Patient Details  Name: Desiree [name] MRN: [id number] Date of Birth: 1946/07/29 Referring Provider (PT): Rodell Perna MD   Encounter Date: 02/28/2020   PT End of Session - 02/28/20 0957    Visit Number 18    Number of Visits 26    Date for PT Re-Evaluation 03/19/20    Progress Note Due on Visit 26    PT Start Time 0951    PT Stop Time 1030    PT Time Calculation (min) 39 min    Activity Tolerance Patient tolerated treatment well    Behavior During Therapy [facility] for tasks assessed/performed           Past Medical History:  Diagnosis Date  . Allergic rhinitis   . Arthritis HIPS/ WRISTS  . Cancer ([place])   . DDD (degenerative disc disease), cervical   . GERD (gastroesophageal reflux disease) OCCASIONAL  . Heart murmur    turbulent flow through LVOT, no obstruction 08/26/18 echo  . History of Bell's palsy 2011  RIGHT SIDE -- RESOLVED  . 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 History of CVA (cerebrovascular accident) PER SCAN IN 2011  . Hyperlipidemia   . Hypertension   . Impaired fasting glucose PER PCP  DR SIMPSON   WATCH DIET  . Mixed urge and stress incontinence   . Nocturia   . Sinus drainage   . Thoracic ascending aortic aneurysm ([place]) LAST CHEST CT 09-02-2010--  FOLLOWED BY  CARDIOLOGIST--  DR [id number]  . Vaginal wall prolapse     Past Surgical History:  Procedure Laterality Date  . ABDOMINAL HYSTERECTOMY    . BILATERAL CARPAL TUNNEL RELEASE  1980'S  . CARDIAC CATHETERIZATION  02-01-2009  DR EICHHORN   NORMAL CORONARY ARTERIES/ NORMAL LV SIZE AND FUNCTION/ AORTIC ROOT SIZE AT THE UPPER LIMIT OF NORMAL   . CARPAL TUNNEL RELEASE    . CERVICAL DISC SURGERY  12-12-2005  DR Vertell Limber   LEFT  C6 - C7 HERINATED / DDD/ SPONDYLOSIS  .  COLONOSCOPY N/A 02/10/2014   Procedure: COLONOSCOPY;  Surgeon: Danie Binder, MD;  [facility];  Service: Endoscopy;  Laterality: N/A;  8:30  . CYSTOCELE REPAIR  08/02/2012   Procedure: ANTERIOR REPAIR (CYSTOCELE);  Surgeon: Ailene Rud, MD;  Location: [facility];  Service: Urology;  Laterality: N/A;  [employer] Uphold Anterior Pelvic Floor Sacrospinus Repair. Anterior wall of the vagina.  . ESOPHAGOGASTRODUODENOSCOPY N/A 02/10/2014   Procedure: ESOPHAGOGASTRODUODENOSCOPY (EGD);  Surgeon: Danie Binder, MD;  [facility];  Service: Endoscopy;  Laterality: N/A;  . GIVENS CAPSULE STUDY N/A 03/13/2014   Procedure: GIVENS CAPSULE STUDY;  Surgeon: Danie Binder, MD;  [facility];  Service: Endoscopy;  Laterality: N/A;  7:30  . NECK SURGERY  2007  . RIGHT PARTIAL MASTECTOMY  11-11-2004  DR [name] YOUNG   DUCTAL CARCINOMA IN SITU RIGHT BREAST  . TOTAL ABDOMINAL HYSTERECTOMY W/ BILATERAL SALPINGOOPHORECTOMY  1990  . TOTAL KNEE ARTHROPLASTY Right 12/21/2019   Procedure: RIGHT  TOTAL KNEE ARTHROPLASTY;  Surgeon: [name], MD;  Location: [place];  Service: Orthopedics;  Laterality: Right;  . TRANSTHORACIC ECHOCARDIOGRAM  03-30-2008  DR MARGARET SIMPSON   LV SIZE AND FUNCTION NORMAL/ MODERATE AORTIC ARCH DILATATION/  MILD MR    There were no vitals filed for this visit.   Subjective Assessment - 02/28/20 0955    Subjective Patient says she thinks she did "right much" this weekend and knee is sore today. Says she gets some numbness with prolonged standing. Soreness is on medial side of RT knee. mostly.    Currently in Pain? Yes    Pain Score 3     Pain Location Knee    Pain Orientation Right;Medial    Pain Descriptors / Indicators Aching    Pain Type Surgical pain    Pain Onset More than a month ago    Pain Frequency Intermittent             02/28/20 0001  Knee/Hip Exercises: Stretches  Gastroc Stretch Both;3 reps;30 seconds   Gastroc Stretch Limitations slant board  Knee/Hip Exercises: Aerobic  Stationary Bike seat 5 rocking 4 minutes for ROM  Knee/Hip Exercises: Standing  Heel Raises Both;20 reps  Knee Flexion Right;1 set;15 reps  Lateral Step Up Right;1 set;15 reps;Hand Hold: 2;Step Height: 4"  Other Standing Knee Exercises sidestepping 15' 2RT no AD   Other Standing Knee Exercises tandem stance on ground 2x30" B, SLS 2x 20 seconds bilateral solid floor   Step Down Right;1 set;15 reps;Hand Hold: 2;Step Height: 4"  Knee/Hip Exercises: Seated  Sit to Sand 10 reps;without UE support  Knee/Hip Exercises: Supine  Knee Extension AROM  Knee Extension Limitations 0  Knee Flexion AROM  Knee Flexion Limitations 111  Manual Therapy  Manual Therapy Myofascial release;Soft tissue mobilization  Manual therapy comments Manual treatment completed separately from other intervention   Soft tissue mobilization STM to RT distal quad and adductors   Myofascial Release scar tissue message        PT Short Term Goals - 02/20/20 1451      PT SHORT TERM GOAL #1   Title Patient will be independent with HEP in order to improve functional outcomes.    Time 3    Period Weeks    Status Achieved    Target Date 01/30/20      PT SHORT TERM GOAL #2   Title Patient will report at least 25% improvement in symptoms for improved quality of life.    Baseline 80% better    Time 3    Period Weeks    Status Achieved    Target Date 01/30/20             PT Long Term Goals - 02/20/20 1451      PT LONG TERM GOAL #1   Title Patient will report at least 75% improvement in symptoms for improved quality of life.    Baseline 80% better    Time 6    Period Weeks    Status Achieved      PT LONG TERM GOAL #2   Title Patient will improve FOTO score by at least 10 points in order to indicate improved tolerance to activity.    Time 6    Period Weeks    Status Achieved      PT LONG TERM GOAL #3   Title Patient will be able to  ambulate for at least 30 minutes with pain no greater than 1/10 in order to demonstrate improved ability to ambulate in the community.    Baseline 5-10 minutes    Time 6    Period Weeks    Status On-going      PT LONG TERM GOAL #4  Title Patient will be able to complete 5x STS in under 11.4 seconds in order to reduce the risk of falls.    Baseline 16.9 seconds; 14.07 seconds    Time 6    Period Weeks    Status On-going                 Plan - 02/28/20 0841    Clinical Impression Statement Patient tolerated session well overall today. Patient continues to be limited by quad weakness and AROM restrictions. Patient required verbal cues for decreased UE assistance with box step ups today. Performed manual STM to distal quad area to address patient complaint of increased pain and muscle restriction. Patient did note improvement in symptoms following. Patient will continue to benefit from skilled therapy services to progress knee strength and mobility to reduce pain and improve functional mobility.    Personal Factors and Comorbidities Age;Behavior Pattern;Time since onset of injury/illness/exacerbation;Fitness;Comorbidity 3+    Comorbidities obesity, HTN, chronic knee pain    Examination-Activity Limitations Bathing;Bed Mobility;Bend;Lift;Locomotion Level;Sit;Sleep;Squat;Stairs;Stand;Transfers    Examination-Participation Restrictions Church;Cleaning;Driving;Laundry;Meal Prep;Volunteer;Yard Work    Stability/Clinical Decision Making Stable/Uncomplicated    Rehab Potential Good    PT Frequency 2x / week    PT Duration 4 weeks    PT Treatment/Interventions ADLs/Self Care Home Management;Aquatic Therapy;Biofeedback;Cryotherapy;Electrical Stimulation;Iontophoresis 4mg /ml Dexamethasone;Traction;Moist Heat;Ultrasound;Parrafin;Fluidtherapy;Contrast Bath;DME Instruction;Gait training;Stair training;Functional mobility training;Therapeutic activities;Therapeutic exercise;Balance  training;Neuromuscular re-education;Patient/family education;Orthotic Fit/Training;Manual techniques;Compression bandaging;Scar mobilization;Passive range of motion;Dry needling;Energy conservation;Splinting;Taping;Vasopneumatic Device;Spinal Manipulations;Joint Manipulations    PT Next Visit Plan Progress to higher level balance, dynamic tandem/retro and add foam to static tandem.  Continue to work on gait without AD.    PT Home Exercise Plan 5/19 Heel slides in seated, heel slides in supine, quad sets, clams, LAQ; 5/20 gait training with quad cane at home 5/24 sit to stands    Consulted and Agree with Plan of Care Patient           Patient will benefit from skilled therapeutic intervention in order to improve the following deficits and impairments:  Abnormal gait, Decreased activity tolerance, Decreased balance, Decreased mobility, Decreased endurance, Decreased scar mobility, Decreased range of motion, Decreased strength, Difficulty walking, Hypomobility, Increased muscle spasms, Impaired perceived functional ability, Improper body mechanics, Obesity, Pain  Visit Diagnosis: Right knee pain, unspecified chronicity  Other abnormalities of gait and mobility  Stiffness of right knee, not elsewhere classified  Muscle weakness (generalized)  Chronic pain of right knee     Problem List Patient Active Problem List   Diagnosis Date Noted  . S/P TKR (total knee replacement), right 12/21/2019  . Constipation 11/10/2019  . Fatigue 10/18/2019  . Left shoulder pain 04/11/2019  . Ganglion cyst of volar aspect of left wrist 10/05/2018  . Knee pain, right 05/31/2017  . Neck pain on left side 01/01/2016  . 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 Metabolic syndrome X 62/56/3893  . Osteopenia 12/12/2013  . Anemia 11/29/2013  . Obesity (BMI 30-39.9) 10/17/2012  . Thoracic aortic aneurysm ([place]) 10/12/2012  . Allergic rhinitis 03/02/2012  . BLADDER PROLAPSE 09/24/2010  .  NEOPLASM, MALIGNANT, BREAST, RIGHT 02/04/2008  . Hyperlipidemia LDL goal <100 02/04/2008  . Essential hypertension 02/04/2008   8:42 AM, 02/29/20 Josue Hector PT DPT  Physical Therapist with [facility]  [phone] [place] [address] [place], [place], [address] Phone: [phone]   Fax:  [phone]  Name: DARYA BIGLER MRN: [id number] Date of  Birth: 1945/11/24

## 2020-03-01 ENCOUNTER — Other Ambulatory Visit: Payer: Self-pay

## 2020-03-01 ENCOUNTER — Ambulatory Visit (HOSPITAL_COMMUNITY): Payer: PPO | Admitting: Physical Therapy

## 2020-03-01 DIAGNOSIS — R2689 Other abnormalities of gait and mobility: Secondary | ICD-10-CM

## 2020-03-01 DIAGNOSIS — M25561 Pain in right knee: Secondary | ICD-10-CM | POA: Diagnosis not present

## 2020-03-01 DIAGNOSIS — M25661 Stiffness of right knee, not elsewhere classified: Secondary | ICD-10-CM

## 2020-03-01 DIAGNOSIS — M6281 Muscle weakness (generalized): Secondary | ICD-10-CM

## 2020-03-01 NOTE — Therapy (Signed)
[place], [place], [address] Phone: [phone]   Fax:  [phone]  Physical Therapy Treatment  Patient Details  Name: Desiree [name] MRN: [id number] Date of Birth: 1945-11-12 Referring Provider (PT): Rodell Perna MD   Encounter Date: 03/01/2020   PT End of Session - 03/01/20 1046    Visit Number 19    Number of Visits 26    Date for PT Re-Evaluation 03/19/20    Progress Note Due on Visit 26    PT Start Time 1004    PT Stop Time 1046    PT Time Calculation (min) 42 min    Activity Tolerance Patient tolerated treatment well    Behavior During Therapy [facility] for tasks assessed/performed           Past Medical History:  Diagnosis Date  . Allergic rhinitis   . Arthritis HIPS/ WRISTS  . Cancer ([place])   . DDD (degenerative disc disease), cervical   . GERD (gastroesophageal reflux disease) OCCASIONAL  . Heart murmur    turbulent flow through LVOT, no obstruction 08/26/18 echo  . History of Bell's palsy 2011  RIGHT SIDE -- RESOLVED  . 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 History of CVA (cerebrovascular accident) PER SCAN IN 2011  . Hyperlipidemia   . Hypertension   . Impaired fasting glucose PER PCP  DR SIMPSON   WATCH DIET  . Mixed urge and stress incontinence   . Nocturia   . Sinus drainage   . Thoracic ascending aortic aneurysm ([place]) LAST CHEST CT 09-02-2010--  FOLLOWED BY  CARDIOLOGIST--  DR [id number]  . Vaginal wall prolapse     Past Surgical History:  Procedure Laterality Date  . ABDOMINAL HYSTERECTOMY    . BILATERAL CARPAL TUNNEL RELEASE  1980'S  . CARDIAC CATHETERIZATION  02-01-2009  DR EICHHORN   NORMAL CORONARY ARTERIES/ NORMAL LV SIZE AND FUNCTION/ AORTIC ROOT SIZE AT THE UPPER LIMIT OF NORMAL   . CARPAL TUNNEL RELEASE    . CERVICAL DISC SURGERY  12-12-2005  DR Vertell Limber   LEFT  C6 - C7 HERINATED / DDD/ SPONDYLOSIS  .  COLONOSCOPY N/A 02/10/2014   Procedure: COLONOSCOPY;  Surgeon: Danie Binder, MD;  [facility];  Service: Endoscopy;  Laterality: N/A;  8:30  . CYSTOCELE REPAIR  08/02/2012   Procedure: ANTERIOR REPAIR (CYSTOCELE);  Surgeon: Ailene Rud, MD;  Location: [facility];  Service: Urology;  Laterality: N/A;  [employer] Uphold Anterior Pelvic Floor Sacrospinus Repair. Anterior wall of the vagina.  . ESOPHAGOGASTRODUODENOSCOPY N/A 02/10/2014   Procedure: ESOPHAGOGASTRODUODENOSCOPY (EGD);  Surgeon: Danie Binder, MD;  [facility];  Service: Endoscopy;  Laterality: N/A;  . GIVENS CAPSULE STUDY N/A 03/13/2014   Procedure: GIVENS CAPSULE STUDY;  Surgeon: Danie Binder, MD;  [facility];  Service: Endoscopy;  Laterality: N/A;  7:30  . NECK SURGERY  2007  . RIGHT PARTIAL MASTECTOMY  11-11-2004  DR [name] YOUNG   DUCTAL CARCINOMA IN SITU RIGHT BREAST  . TOTAL ABDOMINAL HYSTERECTOMY W/ BILATERAL SALPINGOOPHORECTOMY  1990  . TOTAL KNEE ARTHROPLASTY Right 12/21/2019   Procedure: RIGHT  TOTAL KNEE ARTHROPLASTY;  Surgeon: [name], MD;  Location: [place];  Service: Orthopedics;  Laterality: Right;  . TRANSTHORACIC ECHOCARDIOGRAM  03-30-2008  DR MARGARET SIMPSON   LV SIZE AND FUNCTION NORMAL/ MODERATE AORTIC ARCH DILATATION/  MILD MR    There were no vitals filed for this visit.   Subjective Assessment - 03/01/20 1006    Subjective PT states that laying down bothers her knee more, it was a 4/10 last night.    Pertinent History Cancer, chronic knee pain    Limitations Sitting;Lifting;Standing;Walking;House hold activities    How long can you walk comfortably? 20 minutes was 10 minutes was 5 minutes    Patient Stated Goals Improve function    Currently in Pain? Yes    Pain Score 2     Pain Location Knee    Pain Orientation Right    Pain Type Surgical pain    Pain Onset 1 to 4 weeks ago    Pain Frequency Intermittent    Aggravating Factors   bending    Pain Relieving Factors ice                             [facility] Adult PT Treatment/Exercise - 03/01/20 0001      Ambulation/Gait   Ambulation Distance (Feet) 452 Feet    Assistive device None      Knee/Hip Exercises: Stretches   Knee: Self-Stretch to increase Flexion 10 seconds;Right;5 reps    Gastroc Stretch Both;3 reps;30 seconds    Gastroc Stretch Limitations slant board      Knee/Hip Exercises: Aerobic   Stationary Bike full rotation on       Knee/Hip Exercises: Standing   Heel Raises 10 reps    Lateral Step Up Right;2 sets;10 reps;Step Height: 4"    Forward Step Up Right;2 sets;10 reps;Step Height: 4"    Functional Squat 10 reps    Rocker Board 2 minutes    Other Standing Knee Exercises sidestepping 15' 2RT no AD                     PT Short Term Goals - 02/20/20 1451      PT SHORT TERM GOAL #1   Title Patient will be independent with HEP in order to improve functional outcomes.    Time 3    Period Weeks    Status Achieved    Target Date 01/30/20      PT SHORT TERM GOAL #2   Title Patient will report at least 25% improvement in symptoms for improved quality of life.    Baseline 80% better    Time 3    Period Weeks    Status Achieved    Target Date 01/30/20             PT Long Term Goals - 02/20/20 1451      PT LONG TERM GOAL #1   Title Patient will report at least 75% improvement in symptoms for improved quality of life.    Baseline 80% better    Time 6    Period Weeks    Status Achieved      PT LONG TERM GOAL #2   Title Patient will improve FOTO score by at least 10 points in order to indicate improved tolerance to activity.    Time 6    Period Weeks    Status Achieved      PT LONG TERM GOAL #3   Title Patient will be able to ambulate for at least 30 minutes with pain no greater than 1/10 in order to demonstrate improved ability to ambulate in the community.    Baseline 5-10 minutes  Time 6    Period  Weeks    Status On-going      PT LONG TERM GOAL #4   Title Patient will be able to complete 5x STS in under 11.4 seconds in order to reduce the risk of falls.    Baseline 16.9 seconds; 14.07 seconds    Time 6    Period Weeks    Status On-going                 Plan - 03/01/20 1047    Clinical Impression Statement Pt able to complete a full revolution on the bike today on level 6.  Therapist  encouraged pt to walk in her home without her cane to promote confidence in her knee.  Added rockerboard as well as functional squats to improve motor control.  Pt  continues to need skilled therap to decrase pain and improve functional ability.    Personal Factors and Comorbidities Age;Behavior Pattern;Time since onset of injury/illness/exacerbation;Fitness;Comorbidity 3+    Comorbidities obesity, HTN, chronic knee pain    Examination-Activity Limitations Bathing;Bed Mobility;Bend;Lift;Locomotion Level;Sit;Sleep;Squat;Stairs;Stand;Transfers    Examination-Participation Restrictions Church;Cleaning;Driving;Laundry;Meal Prep;Volunteer;Yard Work    Stability/Clinical Decision Making Stable/Uncomplicated    Clinical Decision Making Low    Rehab Potential Good    PT Frequency 2x / week    PT Treatment/Interventions ADLs/Self Care Home Management;Aquatic Therapy;Biofeedback;Cryotherapy;Electrical Stimulation;Iontophoresis 4mg /ml Dexamethasone;Traction;Moist Heat;Ultrasound;Parrafin;Fluidtherapy;Contrast Bath;DME Instruction;Gait training;Stair training;Functional mobility training;Therapeutic activities;Therapeutic exercise;Balance training;Neuromuscular re-education;Patient/family education;Orthotic Fit/Training;Manual techniques;Compression bandaging;Scar mobilization;Passive range of motion;Dry needling;Energy conservation;Splinting;Taping;Vasopneumatic Device;Spinal Manipulations;Joint Manipulations    PT Next Visit Plan Progress to higher level balance, dynamic tandem/retro and add foam to static  tandem.           Patient will benefit from skilled therapeutic intervention in order to improve the following deficits and impairments:  Abnormal gait, Decreased activity tolerance, Decreased balance, Decreased mobility, Decreased endurance, Decreased scar mobility, Decreased range of motion, Decreased strength, Difficulty walking, Hypomobility, Increased muscle spasms, Impaired perceived functional ability, Improper body mechanics, Obesity, Pain  Visit Diagnosis: Right knee pain, unspecified chronicity  Other abnormalities of gait and mobility  Stiffness of right knee, not elsewhere classified  Muscle weakness (generalized)     Problem List Patient Active Problem List   Diagnosis Date Noted  . S/P TKR (total knee replacement), right 12/21/2019  . Constipation 11/10/2019  . Fatigue 10/18/2019  . Left shoulder pain 04/11/2019  . Ganglion cyst of volar aspect of left wrist 10/05/2018  . Knee pain, right 05/31/2017  . Neck pain on left side 01/01/2016  . 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 Metabolic syndrome X 47/42/5956  . Osteopenia 12/12/2013  . Anemia 11/29/2013  . Obesity (BMI 30-39.9) 10/17/2012  . Thoracic aortic aneurysm ([place]) 10/12/2012  . Allergic rhinitis 03/02/2012  . BLADDER PROLAPSE 09/24/2010  . NEOPLASM, MALIGNANT, BREAST, RIGHT 02/04/2008  . Hyperlipidemia LDL goal <100 02/04/2008  . Essential hypertension 02/04/2008    Rayetta Humphrey, PT CLT [phone] 03/01/2020, 10:58 AM  [place] [address] [place], [place], [address] Phone: [phone]   Fax:  [phone]  Name: Desiree [name] MRN: [id number] Date of Birth: Apr 25, 1946

## 2020-03-04 ENCOUNTER — Other Ambulatory Visit: Payer: Self-pay | Admitting: Family Medicine

## 2020-03-05 ENCOUNTER — Other Ambulatory Visit: Payer: Self-pay

## 2020-03-05 ENCOUNTER — Ambulatory Visit (HOSPITAL_COMMUNITY): Payer: PPO | Admitting: Physical Therapy

## 2020-03-05 DIAGNOSIS — M25661 Stiffness of right knee, not elsewhere classified: Secondary | ICD-10-CM

## 2020-03-05 DIAGNOSIS — R2689 Other abnormalities of gait and mobility: Secondary | ICD-10-CM

## 2020-03-05 DIAGNOSIS — M25561 Pain in right knee: Secondary | ICD-10-CM

## 2020-03-05 NOTE — Therapy (Signed)
[place], [place], [address] Phone: [phone]   Fax:  [phone]  Physical Therapy Treatment  Patient Details  Name: Desiree [name] MRN: [id number] Date of Birth: 1946-08-04 Referring Provider (PT): Rodell Perna MD   Encounter Date: 03/05/2020   PT End of Session - 03/05/20 1541    Visit Number 20    Number of Visits 26    Date for PT Re-Evaluation 03/19/20    Progress Note Due on Visit 26    PT Start Time 1450    PT Stop Time 1534    PT Time Calculation (min) 44 min    Activity Tolerance Patient tolerated treatment well    Behavior During Therapy [facility] for tasks assessed/performed           Past Medical History:  Diagnosis Date  . Allergic rhinitis   . Arthritis HIPS/ WRISTS  . Cancer ([place])   . DDD (degenerative disc disease), cervical   . GERD (gastroesophageal reflux disease) OCCASIONAL  . Heart murmur    turbulent flow through LVOT, no obstruction 08/26/18 echo  . History of Bell's palsy 2011  RIGHT SIDE -- RESOLVED  . 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 History of CVA (cerebrovascular accident) PER SCAN IN 2011  . Hyperlipidemia   . Hypertension   . Impaired fasting glucose PER PCP  DR SIMPSON   WATCH DIET  . Mixed urge and stress incontinence   . Nocturia   . Sinus drainage   . Thoracic ascending aortic aneurysm ([place]) LAST CHEST CT 09-02-2010--  FOLLOWED BY  CARDIOLOGIST--  DR OACZYSAY  . Vaginal wall prolapse     Past Surgical History:  Procedure Laterality Date  . ABDOMINAL HYSTERECTOMY    . BILATERAL CARPAL TUNNEL RELEASE  1980'S  . CARDIAC CATHETERIZATION  02-01-2009  DR EICHHORN   NORMAL CORONARY ARTERIES/ NORMAL LV SIZE AND FUNCTION/ AORTIC ROOT SIZE AT THE UPPER LIMIT OF NORMAL   . CARPAL TUNNEL RELEASE    . CERVICAL DISC SURGERY  12-12-2005  DR Vertell Limber   LEFT  C6 - C7 HERINATED / DDD/ SPONDYLOSIS  .  COLONOSCOPY N/A 02/10/2014   Procedure: COLONOSCOPY;  Surgeon: Danie Binder, MD;  [facility];  Service: Endoscopy;  Laterality: N/A;  8:30  . CYSTOCELE REPAIR  08/02/2012   Procedure: ANTERIOR REPAIR (CYSTOCELE);  Surgeon: Ailene Rud, MD;  Location: [facility];  Service: Urology;  Laterality: N/A;  [employer] Uphold Anterior Pelvic Floor Sacrospinus Repair. Anterior wall of the vagina.  . ESOPHAGOGASTRODUODENOSCOPY N/A 02/10/2014   Procedure: ESOPHAGOGASTRODUODENOSCOPY (EGD);  Surgeon: Danie Binder, MD;  [facility];  Service: Endoscopy;  Laterality: N/A;  . GIVENS CAPSULE STUDY N/A 03/13/2014   Procedure: GIVENS CAPSULE STUDY;  Surgeon: Danie Binder, MD;  [facility];  Service: Endoscopy;  Laterality: N/A;  7:30  . NECK SURGERY  2007  . RIGHT PARTIAL MASTECTOMY  11-11-2004  DR [name] YOUNG   DUCTAL CARCINOMA IN SITU RIGHT BREAST  . TOTAL ABDOMINAL HYSTERECTOMY W/ BILATERAL SALPINGOOPHORECTOMY  1990  . TOTAL KNEE ARTHROPLASTY Right 12/21/2019   Procedure: RIGHT  TOTAL KNEE ARTHROPLASTY;  Surgeon: [name], MD;  Location: [facility];  Service: Orthopedics;  Laterality: Right;  . TRANSTHORACIC ECHOCARDIOGRAM  03-30-2008  DR MARGARET SIMPSON   LV SIZE AND FUNCTION NORMAL/ MODERATE AORTIC ARCH DILATATION/  MILD MR    There were no vitals filed for this visit.   Subjective Assessment - 03/05/20 1519    Subjective pt states she only has pain in one pinpoint area at medial knee.    Currently in Pain? No/denies                             [facility] Adult PT Treatment/Exercise - 03/05/20 0001      Knee/Hip Exercises: Stretches   Knee: Self-Stretch to increase Flexion 10 seconds;Right;5 reps    Gastroc Stretch Both;3 reps;30 seconds    Gastroc Stretch Limitations slant board      Knee/Hip Exercises: Aerobic   Stationary Bike full rotation on       Knee/Hip Exercises: Standing   Heel Raises 15 reps     Lateral Step Up Right;2 sets;10 reps;Step Height: 4"    Forward Step Up Right;2 sets;10 reps;Step Height: 4"    Step Down Right;1 set;15 reps;Hand Hold: 2;Step Height: 4"    Other Standing Knee Exercises sidestepping and tandem on balance beam 1RT each    Other Standing Knee Exercises tandem stance on ground 2x30" B, SLS 2x 20 seconds bilateral solid floor       Manual Therapy   Manual Therapy Myofascial release;Soft tissue mobilization    Manual therapy comments Manual treatment completed separately from other intervention     Myofascial Release medial knee with and without tennis ball                  PT Education - 03/05/20 1541    Education Details instructed to continue with self massage/deep friction in pinpoint area of pain.    Person(s) Educated Patient    Methods Explanation;Demonstration    Comprehension Verbalized understanding;Returned demonstration            PT Short Term Goals - 02/20/20 1451      PT SHORT TERM GOAL #1   Title Patient will be independent with HEP in order to improve functional outcomes.    Time 3    Period Weeks    Status Achieved    Target Date 01/30/20      PT SHORT TERM GOAL #2   Title Patient will report at least 25% improvement in symptoms for improved quality of life.    Baseline 80% better    Time 3    Period Weeks    Status Achieved    Target Date 01/30/20             PT Long Term Goals - 02/20/20 1451      PT LONG TERM GOAL #1   Title Patient will report at least 75% improvement in symptoms for improved quality of life.    Baseline 80% better    Time 6    Period Weeks    Status Achieved      PT LONG TERM GOAL #2   Title Patient will improve FOTO score by at least 10 points in order to indicate improved tolerance to activity.    Time 6    Period Weeks    Status Achieved      PT LONG TERM GOAL #3   Title Patient will be able to ambulate for at least 30 minutes with pain no greater than 1/10 in order to  demonstrate improved ability to ambulate in the community.    Baseline 5-10 minutes    Time 6    Period  Weeks    Status On-going      PT LONG TERM GOAL #4   Title Patient will be able to complete 5x STS in under 11.4 seconds in order to reduce the risk of falls.    Baseline 16.9 seconds; 14.07 seconds    Time 6    Period Weeks    Status On-going                 Plan - 03/05/20 1542    Clinical Impression Statement Continued with established POC.  Added forward step downs with noted challenge and progressed to balance beam for dynamic balance.  Pt able to self correct approx 50% of time and therapist assisted other 50%.  Instructed with self massage to complete to tight area medially of patella that presents with mm spasm/tightness.    Personal Factors and Comorbidities Age;Behavior Pattern;Time since onset of injury/illness/exacerbation;Fitness;Comorbidity 3+    Comorbidities obesity, HTN, chronic knee pain    Examination-Activity Limitations Bathing;Bed Mobility;Bend;Lift;Locomotion Level;Sit;Sleep;Squat;Stairs;Stand;Transfers    Examination-Participation Restrictions Church;Cleaning;Driving;Laundry;Meal Prep;Volunteer;Yard Work    Stability/Clinical Decision Making Stable/Uncomplicated    Rehab Potential Good    PT Frequency 2x / week    PT Treatment/Interventions ADLs/Self Care Home Management;Aquatic Therapy;Biofeedback;Cryotherapy;Electrical Stimulation;Iontophoresis 4mg /ml Dexamethasone;Traction;Moist Heat;Ultrasound;Parrafin;Fluidtherapy;Contrast Bath;DME Instruction;Gait training;Stair training;Functional mobility training;Therapeutic activities;Therapeutic exercise;Balance training;Neuromuscular re-education;Patient/family education;Orthotic Fit/Training;Manual techniques;Compression bandaging;Scar mobilization;Passive range of motion;Dry needling;Energy conservation;Splinting;Taping;Vasopneumatic Device;Spinal Manipulations;Joint Manipulations    PT Next Visit Plan Continue  to Progress to higher level balance.           Patient will benefit from skilled therapeutic intervention in order to improve the following deficits and impairments:  Abnormal gait, Decreased activity tolerance, Decreased balance, Decreased mobility, Decreased endurance, Decreased scar mobility, Decreased range of motion, Decreased strength, Difficulty walking, Hypomobility, Increased muscle spasms, Impaired perceived functional ability, Improper body mechanics, Obesity, Pain  Visit Diagnosis: Other abnormalities of gait and mobility  Stiffness of right knee, not elsewhere classified  Right knee pain, unspecified chronicity     Problem List Patient Active Problem List   Diagnosis Date Noted  . S/P TKR (total knee replacement), right 12/21/2019  . Constipation 11/10/2019  . Fatigue 10/18/2019  . Left shoulder pain 04/11/2019  . Ganglion cyst of volar aspect of left wrist 10/05/2018  . Knee pain, right 05/31/2017  . Neck pain on left side 01/01/2016  . 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 Metabolic syndrome X 24/46/2863  . Osteopenia 12/12/2013  . Anemia 11/29/2013  . Obesity (BMI 30-39.9) 10/17/2012  . Thoracic aortic aneurysm ([place]) 10/12/2012  . Allergic rhinitis 03/02/2012  . BLADDER PROLAPSE 09/24/2010  . NEOPLASM, MALIGNANT, BREAST, RIGHT 02/04/2008  . Hyperlipidemia LDL goal <100 02/04/2008  . Essential hypertension 02/04/2008   Teena Irani, PTA/CLT [phone]  Teena Irani 03/05/2020, 3:45 PM  [place], [place], [address] Phone: [phone]   Fax:  [phone]  Name: Desiree [name] MRN: [id number] Date of Birth: 1946-03-11

## 2020-03-07 ENCOUNTER — Ambulatory Visit: Payer: PPO | Admitting: Family Medicine

## 2020-03-08 ENCOUNTER — Ambulatory Visit (HOSPITAL_COMMUNITY): Payer: PPO | Admitting: Physical Therapy

## 2020-03-08 ENCOUNTER — Other Ambulatory Visit: Payer: Self-pay

## 2020-03-08 DIAGNOSIS — M25661 Stiffness of right knee, not elsewhere classified: Secondary | ICD-10-CM

## 2020-03-08 DIAGNOSIS — M25561 Pain in right knee: Secondary | ICD-10-CM | POA: Diagnosis not present

## 2020-03-08 DIAGNOSIS — R2689 Other abnormalities of gait and mobility: Secondary | ICD-10-CM

## 2020-03-08 NOTE — Therapy (Signed)
[place], [place], [address] Phone: [phone]   Fax:  [phone]  Physical Therapy Treatment  Patient Details  Name: Desiree [name] MRN: [id number] Date of Birth: 10-May-1946 Referring Provider (PT): Rodell Perna MD   Encounter Date: 03/08/2020   PT End of Session - 03/08/20 1536    Visit Number 21    Number of Visits 26    Date for PT Re-Evaluation 03/19/20    Progress Note Due on Visit 26    PT Start Time 1452    PT Stop Time 1535    PT Time Calculation (min) 43 min    Activity Tolerance Patient tolerated treatment well    Behavior During Therapy [facility] for tasks assessed/performed           Past Medical History:  Diagnosis Date  . Allergic rhinitis   . Arthritis HIPS/ WRISTS  . Cancer ([place])   . DDD (degenerative disc disease), cervical   . GERD (gastroesophageal reflux disease) OCCASIONAL  . Heart murmur    turbulent flow through LVOT, no obstruction 08/26/18 echo  . History of Bell's palsy 2011  RIGHT SIDE -- RESOLVED  . 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 History of CVA (cerebrovascular accident) PER SCAN IN 2011  . Hyperlipidemia   . Hypertension   . Impaired fasting glucose PER PCP  DR SIMPSON   WATCH DIET  . Mixed urge and stress incontinence   . Nocturia   . Sinus drainage   . Thoracic ascending aortic aneurysm ([place]) LAST CHEST CT 09-02-2010--  FOLLOWED BY  CARDIOLOGIST--  DR PNTIRWER  . Vaginal wall prolapse     Past Surgical History:  Procedure Laterality Date  . ABDOMINAL HYSTERECTOMY    . BILATERAL CARPAL TUNNEL RELEASE  1980'S  . CARDIAC CATHETERIZATION  02-01-2009  DR EICHHORN   NORMAL CORONARY ARTERIES/ NORMAL LV SIZE AND FUNCTION/ AORTIC ROOT SIZE AT THE UPPER LIMIT OF NORMAL   . CARPAL TUNNEL RELEASE    . CERVICAL DISC SURGERY  12-12-2005  DR Vertell Limber   LEFT  C6 - C7 HERINATED / DDD/ SPONDYLOSIS  .  COLONOSCOPY N/A 02/10/2014   Procedure: COLONOSCOPY;  Surgeon: Danie Binder, MD;  [facility];  Service: Endoscopy;  Laterality: N/A;  8:30  . CYSTOCELE REPAIR  08/02/2012   Procedure: ANTERIOR REPAIR (CYSTOCELE);  Surgeon: Ailene Rud, MD;  Location: [facility];  Service: Urology;  Laterality: N/A;  [employer] Uphold Anterior Pelvic Floor Sacrospinus Repair. Anterior wall of the vagina.  . ESOPHAGOGASTRODUODENOSCOPY N/A 02/10/2014   Procedure: ESOPHAGOGASTRODUODENOSCOPY (EGD);  Surgeon: Danie Binder, MD;  [facility];  Service: Endoscopy;  Laterality: N/A;  . GIVENS CAPSULE STUDY N/A 03/13/2014   Procedure: GIVENS CAPSULE STUDY;  Surgeon: Danie Binder, MD;  [facility];  Service: Endoscopy;  Laterality: N/A;  7:30  . NECK SURGERY  2007  . RIGHT PARTIAL MASTECTOMY  11-11-2004  DR [name] YOUNG   DUCTAL CARCINOMA IN SITU RIGHT BREAST  . TOTAL ABDOMINAL HYSTERECTOMY W/ BILATERAL SALPINGOOPHORECTOMY  1990  . TOTAL KNEE ARTHROPLASTY Right 12/21/2019   Procedure: RIGHT  TOTAL KNEE ARTHROPLASTY;  Surgeon: [name], MD;  Location: [place];  Service: Orthopedics;  Laterality: Right;  . TRANSTHORACIC ECHOCARDIOGRAM  03-30-2008  DR MARGARET SIMPSON   LV SIZE AND FUNCTION NORMAL/ MODERATE AORTIC ARCH DILATATION/  MILD MR    There were no vitals filed for this visit.   Subjective Assessment - 03/08/20 1512    Subjective pt states the tennis ball really helped to eliminate her pinpoint pain in medial knee.  Currently painfree.  STates she would like to be discharged next visit if her weekend goes okay.    Currently in Pain? No/denies                             [facility] Adult PT Treatment/Exercise - 03/08/20 0001      Knee/Hip Exercises: Stretches   Knee: Self-Stretch to increase Flexion 10 seconds;Right;5 reps    Gastroc Stretch Both;3 reps;30 seconds    Gastroc Stretch Limitations slant board      Knee/Hip  Exercises: Aerobic   Stationary Bike full rotation seat 6       Knee/Hip Exercises: Standing   Heel Raises 15 reps    Functional Squat 15 reps    Stairs 4" reciprocally with 1 HR 2X    SLS bilateral LE      Knee/Hip Exercises: Supine   Knee Extension AROM    Knee Extension Limitations 0    Knee Flexion AROM    Knee Flexion Limitations 117                    PT Short Term Goals - 03/08/20 1456      PT SHORT TERM GOAL #1   Title Patient will be independent with HEP in order to improve functional outcomes.    Time 3    Period Weeks    Status Achieved    Target Date 01/30/20      PT SHORT TERM GOAL #2   Title Patient will report at least 25% improvement in symptoms for improved quality of life.    Baseline 80% better    Time 3    Period Weeks    Status Achieved    Target Date 01/30/20             PT Long Term Goals - 03/08/20 1458      PT LONG TERM GOAL #1   Title Patient will report at least 75% improvement in symptoms for improved quality of life.    Baseline 80% better    Time 6    Period Weeks    Status Achieved      PT LONG TERM GOAL #2   Title Patient will improve FOTO score by at least 10 points in order to indicate improved tolerance to activity.    Time 6    Period Weeks    Status Achieved      PT LONG TERM GOAL #3   Title Patient will be able to ambulate for at least 30 minutes with pain no greater than 1/10 in order to demonstrate improved ability to ambulate in the community.    Baseline 5-10 minutes    Time 6    Period Weeks    Status On-going      PT LONG TERM GOAL #4   Title Patient will be able to complete 5x STS in under 11.4 seconds in order to reduce the risk of falls.    Baseline 16.9 seconds; 14.07 seconds    Time 6    Period Weeks    Status On-going                 Plan - 03/08/20 1601  Clinical Impression Statement continued with focus on improving ROM and strength of surgical LE.  Pt reports she feels she  will be ready for discharge next visit (Monday).   States she is basically doing everything as she should with just a little instability in her LE's.  Pt given SLS to work on at home.   Pt able to negotiate 1 set of 4" steps reciprocally with 1 HR, however uncomfortable to try 6" stair height at this time.   AROM of Lt knee today 0-117.  Noted, pt's Rt knee ROM is also 0-117.  Pt is completing self manual at this time as well using the tennis ball and remains painfree.    Personal Factors and Comorbidities Age;Behavior Pattern;Time since onset of injury/illness/exacerbation;Fitness;Comorbidity 3+    Comorbidities obesity, HTN, chronic knee pain    Examination-Activity Limitations Bathing;Bed Mobility;Bend;Lift;Locomotion Level;Sit;Sleep;Squat;Stairs;Stand;Transfers    Examination-Participation Restrictions Church;Cleaning;Driving;Laundry;Meal Prep;Volunteer;Yard Work    Stability/Clinical Decision Making Stable/Uncomplicated    Rehab Potential Good    PT Frequency 2x / week    PT Treatment/Interventions ADLs/Self Care Home Management;Aquatic Therapy;Biofeedback;Cryotherapy;Electrical Stimulation;Iontophoresis 4mg /ml Dexamethasone;Traction;Moist Heat;Ultrasound;Parrafin;Fluidtherapy;Contrast Bath;DME Instruction;Gait training;Stair training;Functional mobility training;Therapeutic activities;Therapeutic exercise;Balance training;Neuromuscular re-education;Patient/family education;Orthotic Fit/Training;Manual techniques;Compression bandaging;Scar mobilization;Passive range of motion;Dry needling;Energy conservation;Splinting;Taping;Vasopneumatic Device;Spinal Manipulations;Joint Manipulations    PT Next Visit Plan Continue to Progress to higher level balance.    PT Home Exercise Plan 5/19 Heel slides in seated, heel slides in supine, quad sets, clams, LAQ; 5/20 gait training with quad cane at home 5/24 sit to stands  7/15:  SLS           Patient will benefit from skilled therapeutic intervention in  order to improve the following deficits and impairments:  Abnormal gait, Decreased activity tolerance, Decreased balance, Decreased mobility, Decreased endurance, Decreased scar mobility, Decreased range of motion, Decreased strength, Difficulty walking, Hypomobility, Increased muscle spasms, Impaired perceived functional ability, Improper body mechanics, Obesity, Pain  Visit Diagnosis: Other abnormalities of gait and mobility  Stiffness of right knee, not elsewhere classified  Right knee pain, unspecified chronicity     Problem List Patient Active Problem List   Diagnosis Date Noted  . S/P TKR (total knee replacement), right 12/21/2019  . Constipation 11/10/2019  . Fatigue 10/18/2019  . Left shoulder pain 04/11/2019  . Ganglion cyst of volar aspect of left wrist 10/05/2018  . Knee pain, right 05/31/2017  . Neck pain on left side 01/01/2016  . 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 Metabolic syndrome X 49/44/9675  . Osteopenia 12/12/2013  . Anemia 11/29/2013  . Obesity (BMI 30-39.9) 10/17/2012  . Thoracic aortic aneurysm ([place]) 10/12/2012  . Allergic rhinitis 03/02/2012  . BLADDER PROLAPSE 09/24/2010  . NEOPLASM, MALIGNANT, BREAST, RIGHT 02/04/2008  . Hyperlipidemia LDL goal <100 02/04/2008  . Essential hypertension 02/04/2008   Teena Irani, PTA/CLT [phone]  Teena Irani 03/08/2020, 4:05 PM  [place], [place], [address] Phone: [phone]   Fax:  [phone]  Name: Desiree [name] MRN: [id number] Date of Birth: Oct 18, 1945

## 2020-03-12 ENCOUNTER — Encounter (HOSPITAL_COMMUNITY): Payer: Self-pay | Admitting: Physical Therapy

## 2020-03-12 ENCOUNTER — Ambulatory Visit (HOSPITAL_COMMUNITY): Payer: PPO | Admitting: Physical Therapy

## 2020-03-12 ENCOUNTER — Other Ambulatory Visit: Payer: Self-pay

## 2020-03-12 DIAGNOSIS — M25561 Pain in right knee: Secondary | ICD-10-CM | POA: Diagnosis not present

## 2020-03-12 DIAGNOSIS — M25661 Stiffness of right knee, not elsewhere classified: Secondary | ICD-10-CM

## 2020-03-12 DIAGNOSIS — M6281 Muscle weakness (generalized): Secondary | ICD-10-CM

## 2020-03-12 DIAGNOSIS — R2689 Other abnormalities of gait and mobility: Secondary | ICD-10-CM

## 2020-03-12 NOTE — Therapy (Signed)
[facility] [place], [place], [address] Phone: [phone]   Fax:  [phone]  Physical Therapy Treatment/Discharge Summary  Patient Details  Name: Desiree [name] MRN: [id number] Date of Birth: 05-May-1946 Referring Provider (PT): Rodell Perna MD   Encounter Date: 03/12/2020  PHYSICAL THERAPY DISCHARGE SUMMARY  Visits from Start of Care: 22  Current functional level related to goals / functional outcomes: See below   Remaining deficits: See below   Education / Equipment: See below  Plan: Patient agrees to discharge.  Patient goals were met. Patient is being discharged due to meeting the stated rehab goals.  ?????        PT End of Session - 03/12/20 1527    Visit Number 22    Number of Visits 26    Date for PT Re-Evaluation 03/19/20    Authorization Type [employer]    Progress Note Due on Visit 26    PT Start Time 1519    PT Stop Time 1557    PT Time Calculation (min) 38 min    Activity Tolerance Patient tolerated treatment well    Behavior During Therapy WFL for tasks assessed/performed           Past Medical History:  Diagnosis Date  . Allergic rhinitis   . Arthritis HIPS/ WRISTS  . Cancer ([place])   . DDD (degenerative disc disease), cervical   . GERD (gastroesophageal reflux disease) OCCASIONAL  . Heart murmur    turbulent flow through LVOT, no obstruction 08/26/18 echo  . History of Bell's palsy 2011  RIGHT SIDE -- RESOLVED  . 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 History of CVA (cerebrovascular accident) PER SCAN IN 2011  . Hyperlipidemia   . Hypertension   . Impaired fasting glucose PER PCP  DR SIMPSON   WATCH DIET  . Mixed urge and stress incontinence   . Nocturia   . Sinus drainage   . Thoracic ascending aortic aneurysm ([place]) LAST CHEST CT 09-02-2010--  FOLLOWED BY  CARDIOLOGIST--  DR RJJOACZY  . Vaginal wall  prolapse     Past Surgical History:  Procedure Laterality Date  . ABDOMINAL HYSTERECTOMY    . BILATERAL CARPAL TUNNEL RELEASE  1980'S  . CARDIAC CATHETERIZATION  02-01-2009  DR EICHHORN   NORMAL CORONARY ARTERIES/ NORMAL LV SIZE AND FUNCTION/ AORTIC ROOT SIZE AT THE UPPER LIMIT OF NORMAL   . CARPAL TUNNEL RELEASE    . CERVICAL DISC SURGERY  12-12-2005  DR Vertell Limber   LEFT  C6 - C7 HERINATED / DDD/ SPONDYLOSIS  . COLONOSCOPY N/A 02/10/2014   Procedure: COLONOSCOPY;  Surgeon: Danie Binder, MD;  [facility];  Service: Endoscopy;  Laterality: N/A;  8:30  . CYSTOCELE REPAIR  08/02/2012   Procedure: ANTERIOR REPAIR (CYSTOCELE);  Surgeon: Ailene Rud, MD;  Location: [facility];  Service: Urology;  Laterality: N/A;  [employer] Uphold Anterior Pelvic Floor Sacrospinus Repair. Anterior wall of the vagina.  . ESOPHAGOGASTRODUODENOSCOPY N/A 02/10/2014   Procedure: ESOPHAGOGASTRODUODENOSCOPY (EGD);  Surgeon: Danie Binder, MD;  [facility];  Service: Endoscopy;  Laterality: N/A;  . GIVENS CAPSULE STUDY N/A 03/13/2014   Procedure: GIVENS CAPSULE STUDY;  Surgeon: Danie Binder, MD;  [facility];  Service: Endoscopy;  Laterality: N/A;  7:30  . NECK SURGERY  2007  . RIGHT PARTIAL MASTECTOMY  11-11-2004  DR [name] YOUNG   DUCTAL CARCINOMA IN SITU RIGHT BREAST  . TOTAL ABDOMINAL HYSTERECTOMY W/ BILATERAL SALPINGOOPHORECTOMY  1990  . TOTAL KNEE ARTHROPLASTY Right 12/21/2019   Procedure: RIGHT  TOTAL KNEE ARTHROPLASTY;  Surgeon: [name], MD;  Location: [facility];  Service: Orthopedics;  Laterality: Right;  . TRANSTHORACIC ECHOCARDIOGRAM  03-30-2008  DR MARGARET SIMPSON   LV SIZE AND FUNCTION NORMAL/ MODERATE AORTIC ARCH DILATATION/ MILD MR    There were no vitals filed for this visit.   Subjective Assessment - 03/12/20 1519    Subjective Patient states 99.5% improvement since beginning therapy. She remains limited with pain, stiffness,  swelling. Her HEP is going well. She states her leg gets tired and weak sometimes. She states she may be able to walk for 30 minutes.    Pertinent History Cancer, chronic knee pain    How long can you walk comfortably? 25-30 minutes    Patient Stated Goals Improve function    Currently in Pain? No/denies              [facility] PT Assessment - 03/12/20 0001      Assessment   Medical Diagnosis R TKA    Referring Provider (PT) Rodell Perna MD    Onset Date/Surgical Date 12/21/19    Next MD Visit 03/15/20    Prior Therapy HHPT and hx knee rehab      Precautions   Precautions None      Restrictions   Weight Bearing Restrictions No      Balance Screen   Has the patient fallen in the past 6 months No    Has the patient had a decrease in activity level because of a fear of falling?  No    Is the patient reluctant to leave their home because of a fear of falling?  No      Prior Function   Level of Independence Independent      Cognition   Overall Cognitive Status Within Functional Limits for tasks assessed      Observation/Other Assessments   Focus on Therapeutic Outcomes (FOTO)  19% limited      AROM   Right Knee Extension 0    Right Knee Flexion 117      Strength   Right Hip Flexion 4+/5    Left Hip Flexion 5/5    Right Knee Flexion 5/5    Right Knee Extension 5/5    Left Knee Flexion 5/5    Left Knee Extension 5/5    Right Ankle Dorsiflexion 5/5    Left Ankle Dorsiflexion 5/5      Transfers   Five time sit to stand comments  8.58 seconds      Ambulation/Gait   Stairs Yes    Stairs Assistance 5: Supervision    Stair Management Technique One rail Left;Forwards;Alternating pattern    Number of Stairs 7    Height of Stairs 7    Gait Comments able to navigate with recipricol pattern with bilateral UE use, decreased strength in RLE                                 PT Education - 03/12/20 1526    Education Details Review of HEP, continuing HEP,  mechanics of exercises, strengthening vs. cardio exercise, reassessment findings, POC, returning to physical therapy if needed, resuming more ADL    Person(s) Educated Patient    Methods Explanation;Demonstration;Handout  Comprehension Verbalized understanding;Returned demonstration            PT Short Term Goals - 03/12/20 1536      PT SHORT TERM GOAL #1   Title Patient will be independent with HEP in order to improve functional outcomes.    Time 3    Period Weeks    Status Achieved    Target Date 01/30/20      PT SHORT TERM GOAL #2   Title Patient will report at least 25% improvement in symptoms for improved quality of life.    Baseline 80% better    Time 3    Period Weeks    Status Achieved    Target Date 01/30/20             PT Long Term Goals - 03/12/20 1536      PT LONG TERM GOAL #1   Title Patient will report at least 75% improvement in symptoms for improved quality of life.    Baseline 99.5% better    Time 6    Period Weeks    Status Achieved      PT LONG TERM GOAL #2   Title Patient will improve FOTO score by at least 10 points in order to indicate improved tolerance to activity.    Time 6    Period Weeks    Status Achieved      PT LONG TERM GOAL #3   Title Patient will be able to ambulate for at least 30 minutes with pain no greater than 1/10 in order to demonstrate improved ability to ambulate in the community.    Baseline 5-10 minutes    Time 6    Period Weeks    Status Achieved      PT LONG TERM GOAL #4   Title Patient will be able to complete 5x STS in under 11.4 seconds in order to reduce the risk of falls.    Baseline 16.9 seconds; 14.07 seconds    Time 6    Period Weeks    Status Achieved                 Plan - 03/12/20 1528    Clinical Impression Statement Patient has met all short and long term goals at this time with ability to complete HEP, improved function, improved activity tolerance, increased ambulation ability, and  increased functional strength/endurance. Patient demonstrates improved function, strength, ROM, activity tolerance, gait, and overall mobility since beginning therapy. Patient remains limited by impaired functional strength, balance, edema, and pain. Discussed with patient in detail on continuing HEP, increasing ADL, strengthening, exercise mechanics, POC, and returning to physical therapy if needed to improve strengthening. Patient discharged from physical therapy at this time.    Personal Factors and Comorbidities Age;Behavior Pattern;Time since onset of injury/illness/exacerbation;Fitness;Comorbidity 3+    Comorbidities obesity, HTN, chronic knee pain    Examination-Activity Limitations Bathing;Bed Mobility;Bend;Lift;Locomotion Level;Sit;Sleep;Squat;Stairs;Stand;Transfers    Examination-Participation Restrictions Church;Cleaning;Driving;Laundry;Meal Prep;Volunteer;Yard Work    Stability/Clinical Decision Making Stable/Uncomplicated    Rehab Potential Good    PT Frequency --    PT Treatment/Interventions ADLs/Self Care Home Management;Aquatic Therapy;Biofeedback;Cryotherapy;Electrical Stimulation;Iontophoresis 79m/ml Dexamethasone;Traction;Moist Heat;Ultrasound;Parrafin;Fluidtherapy;Contrast Bath;DME Instruction;Gait training;Stair training;Functional mobility training;Therapeutic activities;Therapeutic exercise;Balance training;Neuromuscular re-education;Patient/family education;Orthotic Fit/Training;Manual techniques;Compression bandaging;Scar mobilization;Passive range of motion;Dry needling;Energy conservation;Splinting;Taping;Vasopneumatic Device;Spinal Manipulations;Joint Manipulations    PT Next Visit Plan n/a    PT Home Exercise Plan 5/19 Heel slides in seated, heel slides in supine, quad sets, clams, LAQ; 5/20 gait training with quad cane at home 5/24 sit  to stands  7/15:  SLS 7/19 lateral stepping, tandem stance, SLS           Patient will benefit from skilled therapeutic intervention in  order to improve the following deficits and impairments:  Abnormal gait, Decreased activity tolerance, Decreased balance, Decreased mobility, Decreased endurance, Decreased scar mobility, Decreased range of motion, Decreased strength, Difficulty walking, Hypomobility, Increased muscle spasms, Impaired perceived functional ability, Improper body mechanics, Obesity, Pain  Visit Diagnosis: Other abnormalities of gait and mobility  Stiffness of right knee, not elsewhere classified  Right knee pain, unspecified chronicity  Muscle weakness (generalized)     Problem List Patient Active Problem List   Diagnosis Date Noted  . S/P TKR (total knee replacement), right 12/21/2019  . Constipation 11/10/2019  . Fatigue 10/18/2019  . Left shoulder pain 04/11/2019  . Ganglion cyst of volar aspect of left wrist 10/05/2018  . Knee pain, right 05/31/2017  . Neck pain on left side 01/01/2016  . 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 Metabolic syndrome X 46/43/1427  . Osteopenia 12/12/2013  . Anemia 11/29/2013  . Obesity (BMI 30-39.9) 10/17/2012  . Thoracic aortic aneurysm ([place]) 10/12/2012  . Allergic rhinitis 03/02/2012  . BLADDER PROLAPSE 09/24/2010  . NEOPLASM, MALIGNANT, BREAST, RIGHT 02/04/2008  . Hyperlipidemia LDL goal <100 02/04/2008  . Essential hypertension 02/04/2008    4:11 PM, 03/12/20 Mearl Latin PT, DPT Physical Therapist at [place], [place], [address] Phone: [phone]   Fax:  [phone]  Name: Desiree [name] MRN: [id number] Date of Birth: Sep 27, 1945

## 2020-03-14 ENCOUNTER — Encounter (HOSPITAL_COMMUNITY): Payer: PPO | Admitting: Physical Therapy

## 2020-03-15 ENCOUNTER — Encounter: Payer: Self-pay | Admitting: Orthopaedic Surgery

## 2020-03-15 ENCOUNTER — Other Ambulatory Visit: Payer: Self-pay

## 2020-03-15 ENCOUNTER — Ambulatory Visit (INDEPENDENT_AMBULATORY_CARE_PROVIDER_SITE_OTHER): Payer: PPO | Admitting: Orthopaedic Surgery

## 2020-03-15 VITALS — Ht 62.0 in | Wt 185.0 lb

## 2020-03-15 DIAGNOSIS — Z96651 Presence of right artificial knee joint: Secondary | ICD-10-CM

## 2020-03-15 NOTE — Progress Notes (Signed)
Post-Op Visit Note   Patient: Desiree [name]           Date of Birth: March 06, 1946           MRN: [id number] Visit Date: 03/15/2020 PCP: [name], MD   Assessment & Plan:post right TKA  Chief Complaint:  Chief Complaint  Patient presents with  . Right Knee - Follow-up    12/21/2019 Right TKA   Visit Diagnoses:  1. S/P TKR (total knee replacement), right     Plan: Patient has 120 degrees flexion full extension still bends at the waist keeps her knee flexed as she steps up with the opposite left leg and then extends her knee straight.  She still has quad weakness with steps left continue quad strengthening for several more weeks.  We went over multiple exercises she can work on.  Recheck 6 months.  Opposite left knee has osteoarthritis but she needs to get her right leg stronger and back to normal for anything is considered for the left.  Follow-Up Instructions: Return in about 6 months (around 09/15/2020).   Orders:  No orders of the defined types were placed in this encounter.  No orders of the defined types were placed in this encounter.   Imaging: No results found.  PMFS History: Patient Active Problem List   Diagnosis Date Noted  . S/P TKR (total knee replacement), right 12/21/2019  . Constipation 11/10/2019  . Fatigue 10/18/2019  . Left shoulder pain 04/11/2019  . Ganglion cyst of volar aspect of left wrist 10/05/2018  . Knee pain, right 05/31/2017  . Neck pain on left side 01/01/2016  . 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 Metabolic syndrome X 49/70/2637  . Osteopenia 12/12/2013  . Anemia 11/29/2013  . Obesity (BMI 30-39.9) 10/17/2012  . Thoracic aortic aneurysm ([place]) 10/12/2012  . Allergic rhinitis 03/02/2012  . BLADDER PROLAPSE 09/24/2010  . NEOPLASM, MALIGNANT, BREAST, RIGHT 02/04/2008  . Hyperlipidemia LDL goal <100 02/04/2008  . Essential hypertension 02/04/2008   Past Medical History:  Diagnosis Date  . Allergic  rhinitis   . Arthritis HIPS/ WRISTS  . Cancer ([place])   . DDD (degenerative disc disease), cervical   . GERD (gastroesophageal reflux disease) OCCASIONAL  . Heart murmur    turbulent flow through LVOT, no obstruction 08/26/18 echo  . History of Bell's palsy 2011  RIGHT SIDE -- RESOLVED  . 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 History of CVA (cerebrovascular accident) PER SCAN IN 2011  . Hyperlipidemia   . Hypertension   . Impaired fasting glucose PER PCP  DR SIMPSON   WATCH DIET  . Mixed urge and stress incontinence   . Nocturia   . Sinus drainage   . Thoracic ascending aortic aneurysm ([place]) LAST CHEST CT 09-02-2010--  FOLLOWED BY  CARDIOLOGIST--  DR CHYIFOYD  . Vaginal wall prolapse     Family History  Problem Relation Age of Onset  . Kidney failure Mother   . Hypertension Mother   . Heart attack Mother 45  . Throat cancer Father   . Prostate cancer Father   . Lung cancer Father 2  . Breast cancer Sister   . Hypertension Brother        2 stents  . Colon cancer Neg Hx   . Colon polyps Neg Hx     Past Surgical History:  Procedure Laterality Date  . ABDOMINAL HYSTERECTOMY    .  BILATERAL CARPAL TUNNEL RELEASE  1980'S  . CARDIAC CATHETERIZATION  02-01-2009  DR EICHHORN   NORMAL CORONARY ARTERIES/ NORMAL LV SIZE AND FUNCTION/ AORTIC ROOT SIZE AT THE UPPER LIMIT OF NORMAL   . CARPAL TUNNEL RELEASE    . CERVICAL DISC SURGERY  12-12-2005  DR Vertell Limber   LEFT  C6 - C7 HERINATED / DDD/ SPONDYLOSIS  . COLONOSCOPY N/A 02/10/2014   Procedure: COLONOSCOPY;  Surgeon: Danie Binder, MD;  [facility];  Service: Endoscopy;  Laterality: N/A;  8:30  . CYSTOCELE REPAIR  08/02/2012   Procedure: ANTERIOR REPAIR (CYSTOCELE);  Surgeon: Ailene Rud, MD;  Location[facility];  Service: Urology;  Laterality: N/A;  [employer] Uphold Anterior Pelvic Floor Sacrospinus Repair. Anterior wall of  the vagina.  . ESOPHAGOGASTRODUODENOSCOPY N/A 02/10/2014   Procedure: ESOPHAGOGASTRODUODENOSCOPY (EGD);  Surgeon: Danie Binder, MD;  [facility];  Service: Endoscopy;  Laterality: N/A;  . GIVENS CAPSULE STUDY N/A 03/13/2014   Procedure: GIVENS CAPSULE STUDY;  Surgeon: Danie Binder, MD;  [facility];  Service: Endoscopy;  Laterality: N/A;  7:30  . NECK SURGERY  2007  . RIGHT PARTIAL MASTECTOMY  11-11-2004  DR [name] YOUNG   DUCTAL CARCINOMA IN SITU RIGHT BREAST  . TOTAL ABDOMINAL HYSTERECTOMY W/ BILATERAL SALPINGOOPHORECTOMY  1990  . TOTAL KNEE ARTHROPLASTY Right 12/21/2019   Procedure: RIGHT  TOTAL KNEE ARTHROPLASTY;  Surgeon: [name], MD;  Location: [facility];  Service: Orthopedics;  Laterality: Right;  . TRANSTHORACIC ECHOCARDIOGRAM  03-30-2008  DR MARGARET SIMPSON   LV SIZE AND FUNCTION NORMAL/ MODERATE AORTIC ARCH DILATATION/ MILD MR   Social History   Occupational History  . Not on file  Tobacco Use  . Smoking status: Never Smoker  . Smokeless tobacco: Never Used  Vaping Use  . Vaping Use: Never used  Substance and Sexual Activity  . Alcohol use: No  . Drug use: No  . Sexual activity: Not Currently

## 2020-03-19 ENCOUNTER — Encounter (HOSPITAL_COMMUNITY): Payer: PPO | Admitting: Physical Therapy

## 2020-03-21 ENCOUNTER — Encounter (HOSPITAL_COMMUNITY): Payer: PPO | Admitting: Physical Therapy

## 2020-03-31 DIAGNOSIS — Z20828 Contact with and (suspected) exposure to other viral communicable diseases: Secondary | ICD-10-CM | POA: Diagnosis not present

## 2020-04-20 ENCOUNTER — Ambulatory Visit (INDEPENDENT_AMBULATORY_CARE_PROVIDER_SITE_OTHER): Payer: PPO

## 2020-04-20 ENCOUNTER — Telehealth: Payer: Self-pay

## 2020-04-20 ENCOUNTER — Other Ambulatory Visit: Payer: Self-pay

## 2020-04-20 DIAGNOSIS — Z23 Encounter for immunization: Secondary | ICD-10-CM | POA: Diagnosis not present

## 2020-04-20 NOTE — Telephone Encounter (Signed)
Handicap Placard Placed in sleeved Call pt when completed

## 2020-05-02 ENCOUNTER — Telehealth: Payer: Self-pay

## 2020-05-02 NOTE — Telephone Encounter (Signed)
Left voicemail requesting an antibiotic for sinus infection. I called her back to get additional information and left the voicemail that antibiotics could not be sent in without an evaluation either in office or by telephone and to call back with any concerns/and to schedule appt

## 2020-05-07 ENCOUNTER — Encounter: Payer: Self-pay | Admitting: Family Medicine

## 2020-05-07 DIAGNOSIS — H35033 Hypertensive retinopathy, bilateral: Secondary | ICD-10-CM | POA: Diagnosis not present

## 2020-05-07 DIAGNOSIS — H25013 Cortical age-related cataract, bilateral: Secondary | ICD-10-CM | POA: Diagnosis not present

## 2020-05-07 DIAGNOSIS — H1045 Other chronic allergic conjunctivitis: Secondary | ICD-10-CM | POA: Diagnosis not present

## 2020-05-07 DIAGNOSIS — H2513 Age-related nuclear cataract, bilateral: Secondary | ICD-10-CM | POA: Diagnosis not present

## 2020-05-11 DIAGNOSIS — I10 Essential (primary) hypertension: Secondary | ICD-10-CM | POA: Diagnosis not present

## 2020-05-11 DIAGNOSIS — E785 Hyperlipidemia, unspecified: Secondary | ICD-10-CM | POA: Diagnosis not present

## 2020-05-11 LAB — COMPLETE METABOLIC PANEL WITH GFR
AG Ratio: 1.4 (calc) (ref 1.0–2.5)
ALT: 9 U/L (ref 6–29)
AST: 19 U/L (ref 10–35)
Albumin: 4.2 g/dL (ref 3.6–5.1)
Alkaline phosphatase (APISO): 88 U/L (ref 37–153)
BUN/Creatinine Ratio: 22 (calc) (ref 6–22)
BUN: 22 mg/dL (ref 7–25)
CO2: 31 mmol/L (ref 20–32)
Calcium: 9.9 mg/dL (ref 8.6–10.4)
Chloride: 102 mmol/L (ref 98–110)
Creat: 1.02 mg/dL — ABNORMAL HIGH (ref 0.60–0.93)
GFR, Est African American: 63 mL/min/{1.73_m2} (ref >=60)
GFR, Est Non African American: 55 mL/min/{1.73_m2} — ABNORMAL LOW (ref >=60)
Globulin: 3 g/dL (calc) (ref 1.9–3.7)
Glucose, Bld: 100 mg/dL — ABNORMAL HIGH (ref 65–99)
Potassium: 4.1 mmol/L (ref 3.5–5.3)
Sodium: 140 mmol/L (ref 135–146)
Total Bilirubin: 0.6 mg/dL (ref 0.2–1.2)
Total Protein: 7.2 g/dL (ref 6.1–8.1)

## 2020-05-11 LAB — CBC
HCT: 34.3 % — ABNORMAL LOW (ref 35.0–45.0)
Hemoglobin: 11.2 g/dL — ABNORMAL LOW (ref 11.7–15.5)
MCH: 27.6 pg (ref 27.0–33.0)
MCHC: 32.7 g/dL (ref 32.0–36.0)
MCV: 84.5 fL (ref 80.0–100.0)
MPV: 9.4 fL (ref 7.5–12.5)
Platelets: 331 10*3/uL (ref 140–400)
RBC: 4.06 10*6/uL (ref 3.80–5.10)
RDW: 14.4 % (ref 11.0–15.0)
WBC: 4.4 10*3/uL (ref 3.8–10.8)

## 2020-05-11 LAB — LIPID PANEL
Cholesterol: 193 mg/dL (ref <200)
HDL: 85 mg/dL (ref >=50)
LDL Cholesterol (Calc): 94 mg/dL (calc)
Non-HDL Cholesterol (Calc): 108 mg/dL (calc) (ref <130)
Total CHOL/HDL Ratio: 2.3 (calc) (ref <5.0)
Triglycerides: 58 mg/dL (ref <150)

## 2020-05-14 ENCOUNTER — Other Ambulatory Visit: Payer: Self-pay | Admitting: Family Medicine

## 2020-06-19 ENCOUNTER — Other Ambulatory Visit: Payer: Self-pay

## 2020-06-19 ENCOUNTER — Telehealth (INDEPENDENT_AMBULATORY_CARE_PROVIDER_SITE_OTHER): Payer: PPO

## 2020-06-19 VITALS — BP 120/80 | HR 69 | Temp 97.6°F | Resp 16 | Ht 62.0 in | Wt 185.0 lb

## 2020-06-19 DIAGNOSIS — Z0000 Encounter for general adult medical examination without abnormal findings: Secondary | ICD-10-CM

## 2020-06-19 NOTE — Patient Instructions (Addendum)
Desiree [name] , Thank you for taking time to come for your Medicare Wellness Visit. I appreciate your ongoing commitment to your health goals. Please review the following plan we discussed and let me know if I can assist you in the future.   Screening recommendations/referrals: Colonoscopy: DUE - 02/13/2024 Mammogram: DUE - ANNUALLY  Bone Density: COMPLETED  Recommended yearly ophthalmology/optometry visit for glaucoma screening and checkup Recommended yearly dental visit for hygiene and checkup  Vaccinations: Influenza vaccine: COMPLETE FOR 2021  Pneumococcal vaccine: COMPLETE  Tdap vaccine: DUE - 07/14/2021  Shingles vaccine: EDUCATION PROVIDED     Advanced directives: YES, ON FILE WITH [place]  Conditions/risks identified: NONE   Next appointment: 10/22/20 @ 9AM WITH DR. Moshe Cipro FOR PHYSICAL.    Preventive Care 73 Years and Older, Female Preventive care refers to lifestyle choices and visits with your health care provider that can promote health and wellness. What does preventive care include?  A yearly physical exam. This is also called an annual well check.  Dental exams once or twice a year.  Routine eye exams. Ask your health care provider how often you should have your eyes checked.  Personal lifestyle choices, including:  Daily care of your teeth and gums.  Regular physical activity.  Eating a healthy diet.  Avoiding tobacco and drug use.  Limiting alcohol use.  Practicing safe sex.  Taking low-dose aspirin every day.  Taking vitamin and mineral supplements as recommended by your health care provider. What happens during an annual well check? The services and screenings done by your health care provider during your annual well check will depend on your age, overall health, lifestyle risk factors, and family history of disease. Counseling  Your health care provider may ask you questions about your:  Alcohol use.  Tobacco use.  Drug use.  Emotional  well-being.  Home and relationship well-being.  Sexual activity.  Eating habits.  History of falls.  Memory and ability to understand (cognition).  Work and work Statistician.  Reproductive health. Screening  You may have the following tests or measurements:  Height, weight, and BMI.  Blood pressure.  Lipid and cholesterol levels. These may be checked every 5 years, or more frequently if you are over 47 years old.  Skin check.  Lung cancer screening. You may have this screening every year starting at age 73 if you have a 30-pack-year history of smoking and currently smoke or have quit within the past 15 years.  Fecal occult blood test (FOBT) of the stool. You may have this test every year starting at age 73.  Flexible sigmoidoscopy or colonoscopy. You may have a sigmoidoscopy every 5 years or a colonoscopy every 10 years starting at age 73.  Hepatitis C blood test.  Hepatitis B blood test.  Sexually transmitted disease (STD) testing.  Diabetes screening. This is done by checking your blood sugar (glucose) after you have not eaten for a while (fasting). You may have this done every 1-3 years.  Bone density scan. This is done to screen for osteoporosis. You may have this done starting at age 73.  Mammogram. This may be done every 1-2 years. Talk to your health care provider about how often you should have regular mammograms. Talk with your health care provider about your test results, treatment options, and if necessary, the need for more tests. Vaccines  Your health care provider may recommend certain vaccines, such as:  Influenza vaccine. This is recommended every year.  Tetanus, diphtheria, and acellular pertussis (  Tdap, Td) vaccine. You may need a Td booster every 10 years.  Zoster vaccine. You may need this after age 13.  Pneumococcal 13-valent conjugate (PCV13) vaccine. One dose is recommended after age 73.  Pneumococcal polysaccharide (PPSV23) vaccine. One  dose is recommended after age 73. Talk to your health care provider about which screenings and vaccines you need and how often you need them. This information is not intended to replace advice given to you by your health care provider. Make sure you discuss any questions you have with your health care provider. Document Released: 09/07/2015 Document Revised: 04/30/2016 Document Reviewed: 06/12/2015 Elsevier Interactive Patient Education  2017 [place] Prevention in the Home Falls can cause injuries. They can happen to people of all ages. There are many things you can do to make your home safe and to help prevent falls. What can I do on the outside of my home?  Regularly fix the edges of walkways and driveways and fix any cracks.  Remove anything that might make you trip as you walk through a door, such as a raised step or threshold.  Trim any bushes or trees on the path to your home.  Use bright outdoor lighting.  Clear any walking paths of anything that might make someone trip, such as rocks or tools.  Regularly check to see if handrails are loose or broken. Make sure that both sides of any steps have handrails.  Any raised decks and porches should have guardrails on the edges.  Have any leaves, snow, or ice cleared regularly.  Use sand or salt on walking paths during winter.  Clean up any spills in your garage right away. This includes oil or grease spills. What can I do in the bathroom?  Use night lights.  Install grab bars by the toilet and in the tub and shower. Do not use towel bars as grab bars.  Use non-skid mats or decals in the tub or shower.  If you need to sit down in the shower, use a plastic, non-slip stool.  Keep the floor dry. Clean up any water that spills on the floor as soon as it happens.  Remove soap buildup in the tub or shower regularly.  Attach bath mats securely with double-sided non-slip rug tape.  Do not have throw rugs and other  things on the floor that can make you trip. What can I do in the bedroom?  Use night lights.  Make sure that you have a light by your bed that is easy to reach.  Do not use any sheets or blankets that are too big for your bed. They should not hang down onto the floor.  Have a firm chair that has side arms. You can use this for support while you get dressed.  Do not have throw rugs and other things on the floor that can make you trip. What can I do in the kitchen?  Clean up any spills right away.  Avoid walking on wet floors.  Keep items that you use a lot in easy-to-reach places.  If you need to reach something above you, use a strong step stool that has a grab bar.  Keep electrical cords out of the way.  Do not use floor polish or wax that makes floors slippery. If you must use wax, use non-skid floor wax.  Do not have throw rugs and other things on the floor that can make you trip. What can I do with my stairs?  Do  not leave any items on the stairs.  Make sure that there are handrails on both sides of the stairs and use them. Fix handrails that are broken or loose. Make sure that handrails are as long as the stairways.  Check any carpeting to make sure that it is firmly attached to the stairs. Fix any carpet that is loose or worn.  Avoid having throw rugs at the top or bottom of the stairs. If you do have throw rugs, attach them to the floor with carpet tape.  Make sure that you have a light switch at the top of the stairs and the bottom of the stairs. If you do not have them, ask someone to add them for you. What else can I do to help prevent falls?  Wear shoes that:  Do not have high heels.  Have rubber bottoms.  Are comfortable and fit you well.  Are closed at the toe. Do not wear sandals.  If you use a stepladder:  Make sure that it is fully opened. Do not climb a closed stepladder.  Make sure that both sides of the stepladder are locked into place.  Ask  someone to hold it for you, if possible.  Clearly mark and make sure that you can see:  Any grab bars or handrails.  First and last steps.  Where the edge of each step is.  Use tools that help you move around (mobility aids) if they are needed. These include:  Canes.  Walkers.  Scooters.  Crutches.  Turn on the lights when you go into a dark area. Replace any light bulbs as soon as they burn out.  Set up your furniture so you have a clear path. Avoid moving your furniture around.  If any of your floors are uneven, fix them.  If there are any pets around you, be aware of where they are.  Review your medicines with your doctor. Some medicines can make you feel dizzy. This can increase your chance of falling. Ask your doctor what other things that you can do to help prevent falls. This information is not intended to replace advice given to you by your health care provider. Make sure you discuss any questions you have with your health care provider. Document Released: 06/07/2009 Document Revised: 01/17/2016 Document Reviewed: 09/15/2014 Elsevier Interactive Patient Education  2017 [employer].

## 2020-06-19 NOTE — Progress Notes (Addendum)
Subjective:   Desiree [name] is a 74 y.o. female who presents for Medicare Annual (Subsequent) preventive examination.        Objective:    There were no vitals filed for this visit. There is no height or weight on file to calculate BMI.  Advanced Directives 01/09/2020 12/21/2019 12/14/2019 07/01/2018 06/16/2018 06/15/2018 06/10/2018  Does Patient Have a Medical Advance Directive? Yes Yes No No No No No  Type of Advance Directive Living will Living will - - - - -  Does patient want to make changes to medical advance directive? - No - Patient declined - - - - -  Would patient like information on creating a medical advance directive? No - Patient declined - No - Patient declined No - Patient declined No - Patient declined No - Patient declined No - Patient declined    Current Medications (verified) Outpatient Encounter Medications as of 06/19/2020  Medication Sig  . acetaminophen (TYLENOL) 500 MG tablet Take one to  two tablets by mouth at night for pain  . Artificial Tear Solution (SOOTHE XP OP) Place 2 drops into both ears daily as needed (Dry eye).  . Ascorbic Acid (VITAMIN C) 1000 MG tablet Take 1,000 mg by mouth daily.  Marland Kitchen aspirin EC 325 MG tablet Take 1 tablet (325 mg total) by mouth daily.  . betamethasone dipropionate (DIPROLENE) 0.05 % cream Apply sparingly two times daily for 10 days, then as needed, to affected areas( hairline, neck)  . calcium-vitamin D (OSCAL 500/200 D-3) 500-200 MG-UNIT per tablet Take 1 tablet by mouth daily with breakfast.   . carvedilol (COREG) 3.125 MG tablet Take 1 tablet (3.125 mg total) by mouth 2 (two) times daily with a meal.  . Cetirizine HCl 10 MG TBDP Take 1 tablet by mouth daily. (Patient taking differently: Take 10 mg by mouth daily. )  . Cholecalciferol (VITAMIN D) 50 MCG (2000 UT) tablet Take 2,000 Units by mouth daily.   Marland Kitchen docusate sodium (COLACE) 100 MG capsule Take 100 mg by mouth daily as needed for mild constipation.  . Ferrous  Sulfate (SLOW FE) 142 (45 Fe) MG TBCR Take 142 mg by mouth daily. (Patient taking differently: Take 142 mg by mouth every other day. )  . gabapentin (NEURONTIN) 100 MG capsule TAKE TWO CAPSULES BY MOUTH ONCE DAILY AT BEDTIME (Patient taking differently: Take 200 mg by mouth 4 (four) times daily. )  . guaifenesin (HUMIBID E) 400 MG TABS tablet Take 400 mg by mouth daily as needed (Mucus build up). Mucus relief  . hydrochlorothiazide (HYDRODIURIL) 25 MG tablet TAKE 1 TABLET BY MOUTH ONCE DAILY IN THE MORNING  . ibuprofen (ADVIL) 800 MG tablet Take one tablet by mouth at bedtime , as needed, for right knee pain. Maximum is 4 tablets per week.  . losartan (COZAAR) 50 MG tablet Take 1/2 (one-half) tablet by mouth once daily  . lovastatin (MEVACOR) 40 MG tablet Take 1 tablet (40 mg total) by mouth daily.  . methocarbamol (ROBAXIN) 500 MG tablet TAKE 1 TABLET BY MOUTH EVERY 8 HOURS AS NEEDED FOR MUSCLE SPASM  . montelukast (SINGULAIR) 10 MG tablet TAKE 1 TABLET BY MOUTH AT BEDTIME  . Multiple Vitamin (MULTIVITAMIN) tablet Take 1 tablet by mouth daily. One a day woman's  . olopatadine (PATANOL) 0.1 % ophthalmic solution Place 1 drop into both eyes daily.  . Olopatadine HCl 0.2 % SOLN Apply 1 drop to eye daily.  . potassium chloride (KLOR-CON) 10 MEQ tablet Take 1  tablet (10 mEq total) by mouth daily.  Marland Kitchen zinc gluconate 50 MG tablet Take 50 mg by mouth daily.  . [DISCONTINUED] amLODipine (NORVASC) 10 MG tablet Take 1 tablet (10 mg total) by mouth daily.   No facility-administered encounter medications on file as of 06/19/2020.    Allergies (verified) Codeine and Quinolones   History: Past Medical History:  Diagnosis Date  . Allergic rhinitis   . Arthritis HIPS/ WRISTS  . Cancer ([place])   . DDD (degenerative disc disease), cervical   . GERD (gastroesophageal reflux disease) OCCASIONAL  . Heart murmur    turbulent flow through LVOT, no obstruction 08/26/18 echo  . History of Bell's palsy 2011  RIGHT  SIDE -- RESOLVED  . 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 History of CVA (cerebrovascular accident) PER SCAN IN 2011  . Hyperlipidemia   . Hypertension   . Impaired fasting glucose PER PCP  DR SIMPSON   WATCH DIET  . Mixed urge and stress incontinence   . Nocturia   . Sinus drainage   . Thoracic ascending aortic aneurysm ([place]) LAST CHEST CT 09-02-2010--  FOLLOWED BY  CARDIOLOGIST--  DR [id number]  . Vaginal wall prolapse    Past Surgical History:  Procedure Laterality Date  . ABDOMINAL HYSTERECTOMY    . BILATERAL CARPAL TUNNEL RELEASE  1980'S  . CARDIAC CATHETERIZATION  02-01-2009  DR EICHHORN   NORMAL CORONARY ARTERIES/ NORMAL LV SIZE AND FUNCTION/ AORTIC ROOT SIZE AT THE UPPER LIMIT OF NORMAL   . CARPAL TUNNEL RELEASE    . CERVICAL DISC SURGERY  12-12-2005  DR Vertell Limber   LEFT  C6 - C7 HERINATED / DDD/ SPONDYLOSIS  . COLONOSCOPY N/A 02/10/2014   Procedure: COLONOSCOPY;  Surgeon: Danie Binder, MD;  [facility];  Service: Endoscopy;  Laterality: N/A;  8:30  . CYSTOCELE REPAIR  08/02/2012   Procedure: ANTERIOR REPAIR (CYSTOCELE);  Surgeon: Ailene Rud, MD;  Location: [facility];  Service: Urology;  Laterality: N/A;  [employer] Uphold Anterior Pelvic Floor Sacrospinus Repair. Anterior wall of the vagina.  . ESOPHAGOGASTRODUODENOSCOPY N/A 02/10/2014   Procedure: ESOPHAGOGASTRODUODENOSCOPY (EGD);  Surgeon: Danie Binder, MD;  [facility];  Service: Endoscopy;  Laterality: N/A;  . GIVENS CAPSULE STUDY N/A 03/13/2014   Procedure: GIVENS CAPSULE STUDY;  Surgeon: Danie Binder, MD;  [facility];  Service: Endoscopy;  Laterality: N/A;  7:30  . JOINT REPLACEMENT N/A    Phreesia 06/16/2020  . NECK SURGERY  2007  . RIGHT PARTIAL MASTECTOMY  11-11-2004  DR [name] YOUNG   DUCTAL CARCINOMA IN SITU RIGHT BREAST  . TOTAL ABDOMINAL HYSTERECTOMY W/ BILATERAL  SALPINGOOPHORECTOMY  1990  . TOTAL KNEE ARTHROPLASTY Right 12/21/2019   Procedure: RIGHT  TOTAL KNEE ARTHROPLASTY;  Surgeon: [name], MD;  Location: [facility];  Service: Orthopedics;  Laterality: Right;  . TRANSTHORACIC ECHOCARDIOGRAM  03-30-2008  DR MARGARET SIMPSON   LV SIZE AND FUNCTION NORMAL/ MODERATE AORTIC ARCH DILATATION/ MILD MR   Family History  Problem Relation Age of Onset  . Kidney failure Mother   . Hypertension Mother   . Heart attack Mother 80  . Throat cancer Father   . Prostate cancer Father   . Lung cancer Father 63  . Breast cancer Sister   . Hypertension Brother        2 stents  . Colon cancer Neg Hx   .  Colon polyps Neg Hx    Social History   Socioeconomic History  . Marital status: Divorced    Spouse name: Not on file  . Number of children: 4  . Years of education: 12+  . Highest education level: 12th grade  Occupational History  . Not on file  Tobacco Use  . Smoking status: Never Smoker  . Smokeless tobacco: Never Used  Vaping Use  . Vaping Use: Never used  Substance and Sexual Activity  . Alcohol use: No  . Drug use: No  . Sexual activity: Not Currently  Other Topics Concern  . Not on file  Social History Narrative   RETIRED FROM [employer]. 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   Social Determinants of Health   Financial Resource Strain:   . Difficulty of Paying Living Expenses: Not on file  Food Insecurity:   . Worried About Charity fundraiser in the Last Year: Not on file  . Ran Out of Food in the Last Year: Not on file  Transportation Needs:   . Lack of Transportation (Medical): Not on file  . Lack of Transportation (Non-Medical): Not on file  Physical Activity:   . Days of Exercise per Week: Not on file  . Minutes of Exercise per Session: Not on file  Stress:   . Feeling of Stress : Not on file  Social Connections:   . Frequency of Communication with Friends and Family: Not on file  . Frequency of Social Gatherings with Friends and Family:  Not on file  . Attends Religious Services: Not on file  . Active Member of Clubs or Organizations: Not on file  . Attends Archivist Meetings: Not on file  . Marital Status: Not on file    Tobacco Counseling Counseling given: Not Answered                  Diabetic? No          Activities of Daily Living In your present state of health, do you have any difficulty performing the following activities: 12/21/2019 12/21/2019  Hearing? N N  Vision? N N  Difficulty concentrating or making decisions? N N  Walking or climbing stairs? Y Y  Dressing or bathing? N N  Doing errands, shopping? N -  Some recent data might be hidden    Patient Care Team: [name], MD as PCP - General Croitoru, Dani Gobble, MD as PCP - Cardiology (Cardiology) Grace Isaac, MD as Consulting Physician (Cardiothoracic Surgery) Croitoru, Dani Gobble, MD as Consulting Physician (Cardiology) Hortencia Pilar, MD as Consulting Physician (Ophthalmology) Carolan Clines, MD (Inactive) as Consulting Physician (Urology)  Indicate any recent Medical Services you may have received from other than [facility] providers in the past year (date may be approximate).     Assessment:   This is a routine wellness examination for Desiree [name].  Hearing/Vision screen No exam data present  Dietary issues and exercise activities discussed:    Goals    .  Cut out extra servings (pt-stated)      I would love to talk with a dietician and learn to eat better     .  Increase physical activity    .  Weight (lb) < 175 lb (79.4 kg)      Starting 08/05/2016 patient would like to maintain her current exercise routine and slowly increase to 40 minutes 3 times a week.      Depression Screen [facility] 2/9 Scores 02/20/2020 10/18/2019 06/16/2019 04/11/2019 10/27/2018 10/05/2018  06/14/2018  PHQ - 2 Score 0 0 0 0 0 0 0  PHQ- 9 Score - - - - - - -    Fall Risk Fall Risk  02/20/2020 11/10/2019 07/14/2019 06/16/2019  04/11/2019  Falls in the past year? 0 0 0 0 0  Number falls in past yr: 0 0 0 0 0  Injury with Fall? 0 0 0 0 0  Risk for fall due to : No Fall Risks - - - -  Follow up Falls evaluation completed - - - -    Any stairs in or around the home? Yes  If so, are there any without handrails? Yes  Home free of loose throw rugs in walkways, pet beds, electrical cords, etc? Yes  Adequate lighting in your home to reduce risk of falls? Yes   ASSISTIVE DEVICES UTILIZED TO PREVENT FALLS:  Life alert? No  Use of a cane, [place] or w/c? Yes  Grab bars in the bathroom? No  Shower chair or bench in shower? No  Elevated toilet seat or a handicapped toilet? No   TIMED UP AND GO:  Was the test performed? No .   Cognitive Function: MMSE - Mini Mental State Exam 09/21/2014  Orientation to time 5  Orientation to Place 5  Registration 3  Attention/ Calculation 4  Recall 3  Language- name 2 objects 2  Language- repeat 1  Language- follow 3 step command 2  Language- read & follow direction 1  Write a sentence 1  Copy design 1  Total score 28     6CIT Screen 06/16/2019 06/14/2018 06/10/2017 08/05/2016  What Year? 0 points 0 points 0 points 0 points  What month? 0 points 0 points 0 points 0 points  What time? 0 points 0 points 0 points 0 points  Count back from 20 0 points 0 points 0 points 0 points  Months in reverse 0 points 0 points 0 points 0 points  Repeat phrase 0 points 2 points 0 points 0 points  Total Score 0 2 0 0    Immunizations Immunization History  Administered Date(s) Administered  . Fluad Quad(high Dose 65+) 04/25/2019  . Influenza Split 07/15/2011, 06/30/2012  . Influenza Whole 06/13/2010  . Influenza,inj,Quad PF,6+ Mos 07/13/2013, 05/22/2014, 06/12/2015, 07/08/2016, 05/27/2017, 05/05/2018, 04/20/2020  . Moderna SARS-COVID-2 Vaccination 10/06/2019, 11/07/2019  . Pneumococcal Conjugate-13 09/21/2014  . Pneumococcal Polysaccharide-23 11/11/2011  . Tdap 07/15/2011  . Zoster  11/09/2006    TDAP status: Up to date Flu Vaccine status: Up to date Pneumococcal vaccine status: Up to date Covid-19 vaccine status: Completed vaccines  Qualifies for Shingles Vaccine? Yes   Zostavax completed Yes   Shingrix Completed?: No.    Education has been provided regarding the importance of this vaccine. Patient has been advised to call insurance company to determine out of pocket expense if they have not yet received this vaccine. Advised may also receive vaccine at local pharmacy or Health Dept. Verbalized acceptance and understanding.  Screening Tests Health Maintenance  Topic Date Due  . TETANUS/TDAP  07/14/2021  . MAMMOGRAM  02/28/2022  . COLONOSCOPY  02/13/2024  . INFLUENZA VACCINE  Completed  . DEXA SCAN  Completed  . COVID-19 Vaccine  Completed  . Hepatitis C Screening  Completed  . PNA vac Low Risk Adult  Completed    Health Maintenance  There are no preventive care reminders to display for this patient.  Colorectal cancer screening: Completed NORMAL. Repeat every 10 years Mammogram status: Completed NORMAL. Repeat every  year BONE DENSITY: COMPLETE   Lung Cancer Screening: (Low Dose CT Chest recommended if Age 80-80 years, 30 pack-year currently smoking OR have quit w/in 15years.) does not qualify.   Additional Screening:  Hepatitis C Screening: Does qualify; Completed   Vision Screening: Recommended annual ophthalmology exams for early detection of glaucoma and other disorders of the eye.  Is the patient up to date with their annual eye exam?  Yes  - 04/2020  Who is the provider or what is the name of the office in which the patient attends annual eye exams? Dr. Kathlen Mody in [place]   If pt is not established with a provider, would they like to be referred to a provider to establish care? No .   Dental Screening: Recommended annual dental exams for proper oral hygiene  Community Resource Referral / Chronic Care Management: CRR required this visit?   No   CCM required this visit?  No      Plan:     I have personally reviewed and noted the following in the patient's chart:   . Medical and social history . Use of alcohol, tobacco or illicit drugs  . Current medications and supplements . Functional ability and status . Nutritional status . Physical activity . Advanced directives . List of other physicians . Hospitalizations, surgeries, and ER visits in previous 12 months . Vitals . Screenings to include cognitive, depression, and falls . Referrals and appointments  In addition, I have reviewed and discussed with patient certain preventive protocols, quality metrics, and best practice recommendations. A written personalized care plan for preventive services as well as general preventive health recommendations were provided to patient.     Lonn Georgia, LPN   [id number]   Nurse Notes: AWV conducted over the phone with pt consent for televisit via audio. Approx time spent on call was 34min. Pt was in the home and provider located in the office. Pt very attentive and up to date with care gaps. Education provided with after visit summary for Shingrix.

## 2020-06-25 ENCOUNTER — Other Ambulatory Visit: Payer: Self-pay | Admitting: Family Medicine

## 2020-07-03 ENCOUNTER — Other Ambulatory Visit: Payer: Self-pay | Admitting: Family Medicine

## 2020-07-09 ENCOUNTER — Other Ambulatory Visit: Payer: Self-pay | Admitting: Cardiothoracic Surgery

## 2020-07-09 DIAGNOSIS — I712 Thoracic aortic aneurysm, without rupture, unspecified: Secondary | ICD-10-CM

## 2020-07-15 IMAGING — MG DIGITAL SCREENING BILAT W/ TOMO W/ CAD
8 series · 8 of 24 positions shown · non-contrast
Comparison: Previous exam(s).

CLINICAL DATA: Screening. Prior malignant lumpectomy from the RIGHT
breast in 3888.

EXAM:
DIGITAL SCREENING BILATERAL MAMMOGRAM WITH TOMO AND CAD

[L MLO synth-2D]
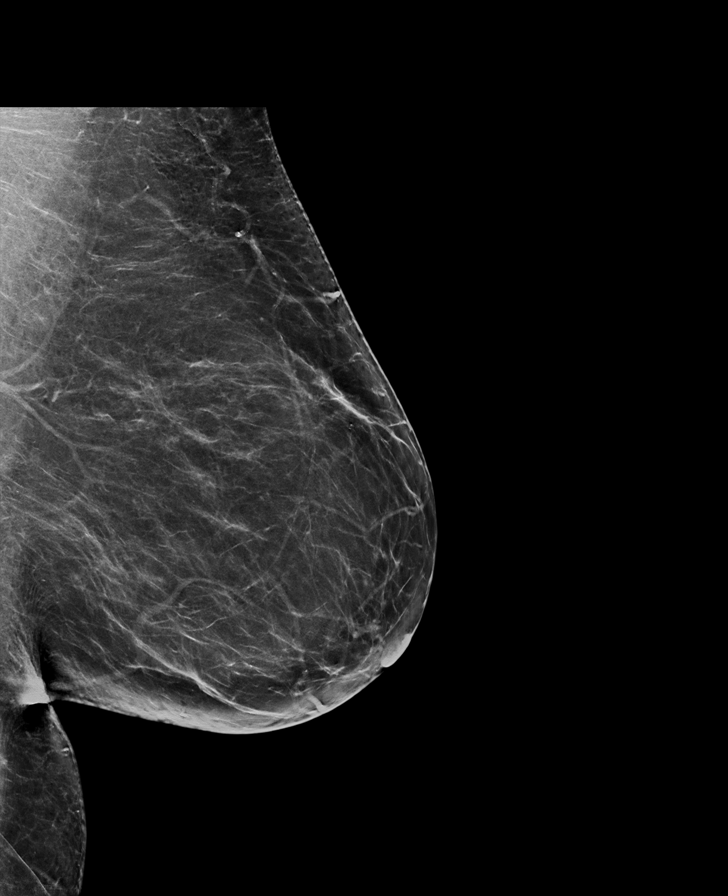

[R CC synth-2D]
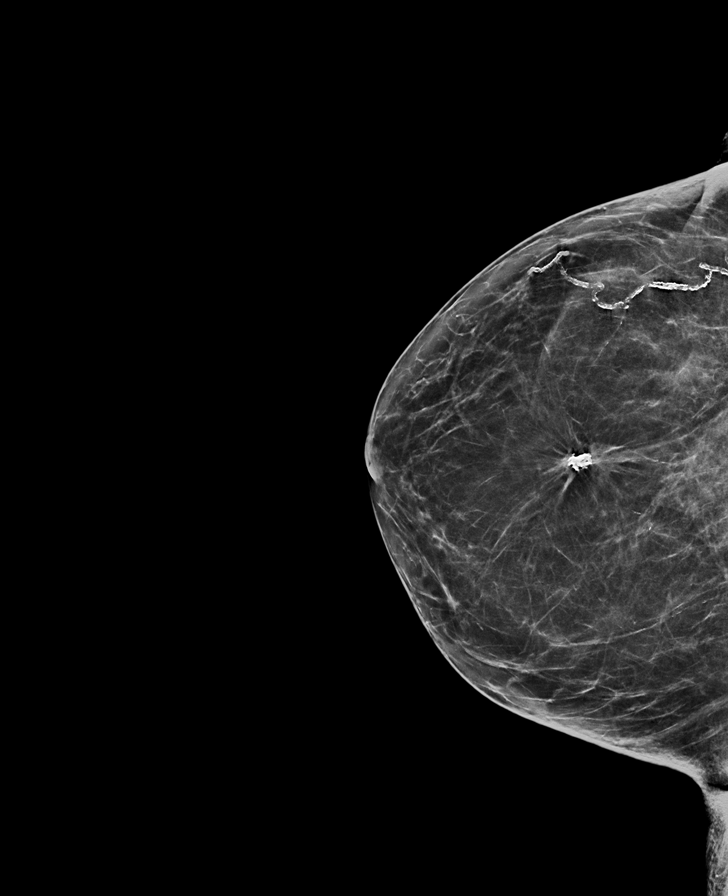

[L CC synth-2D]
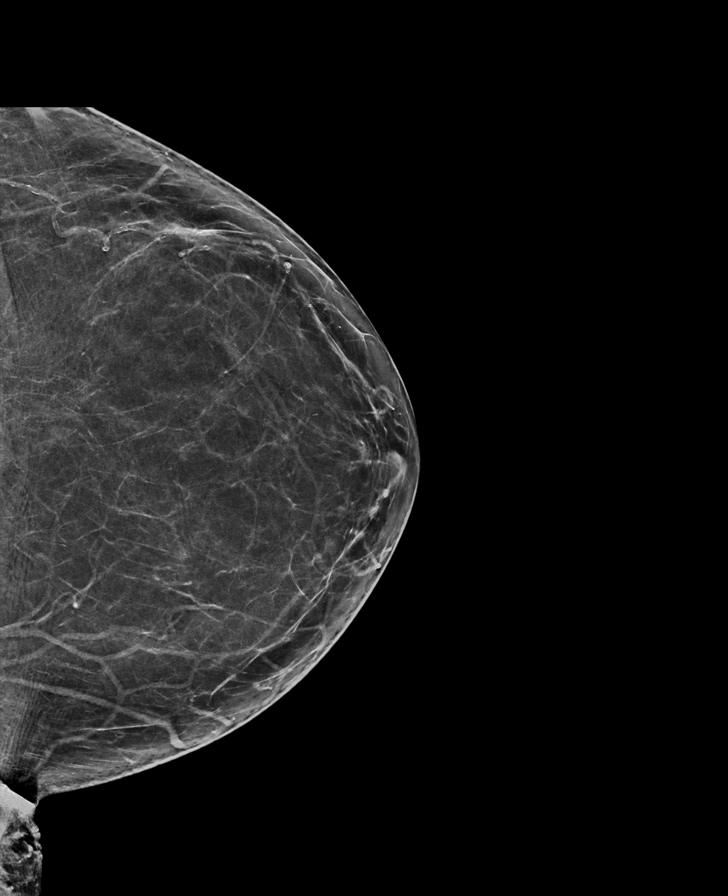

[R MLO synth-2D]
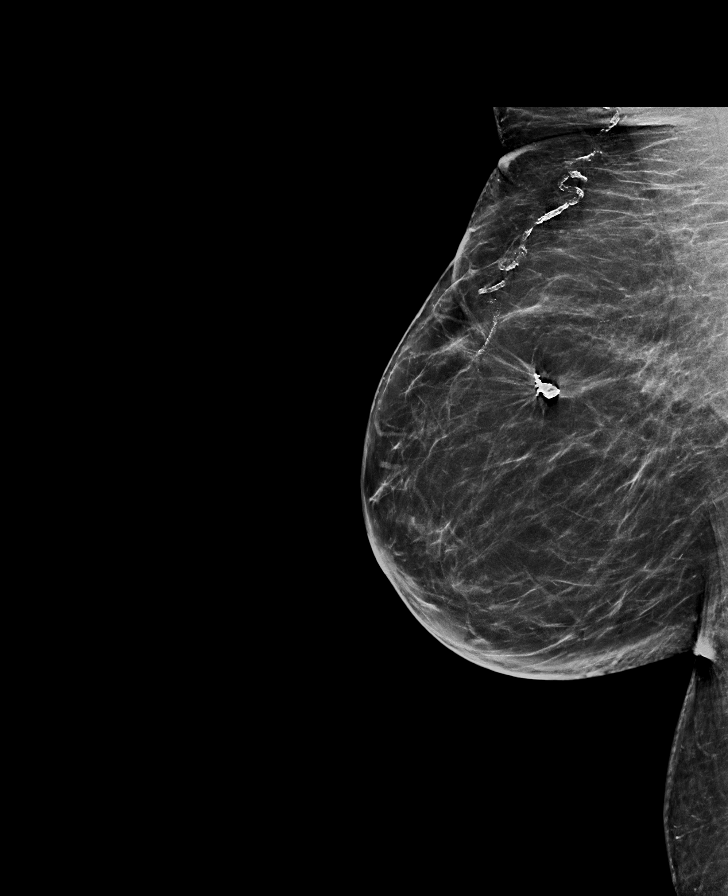

[R CC tomo · tomo slice 33/64.0]
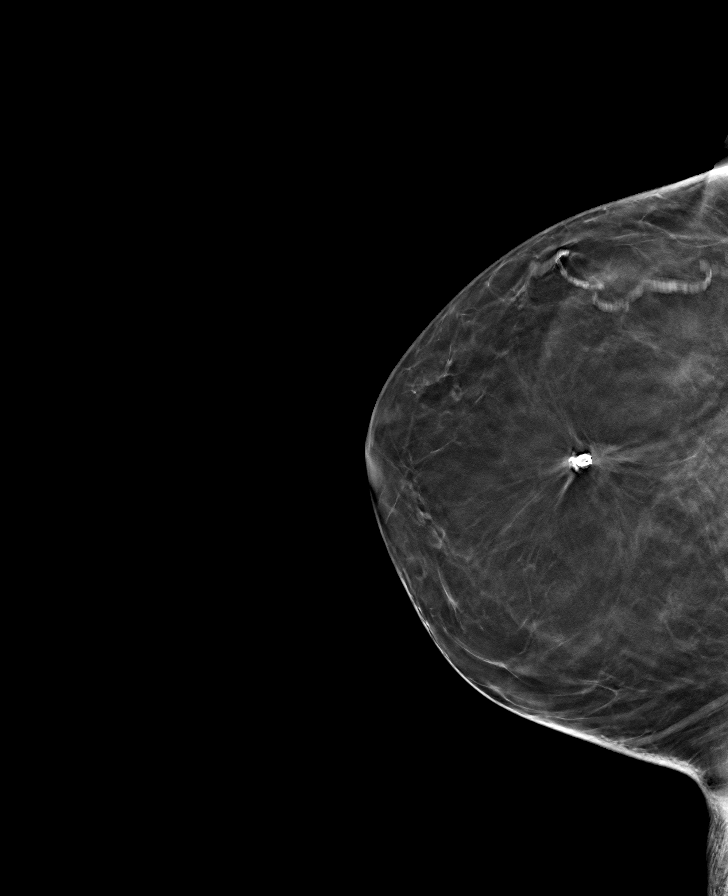

[R MLO tomo · tomo slice 37/72.0]
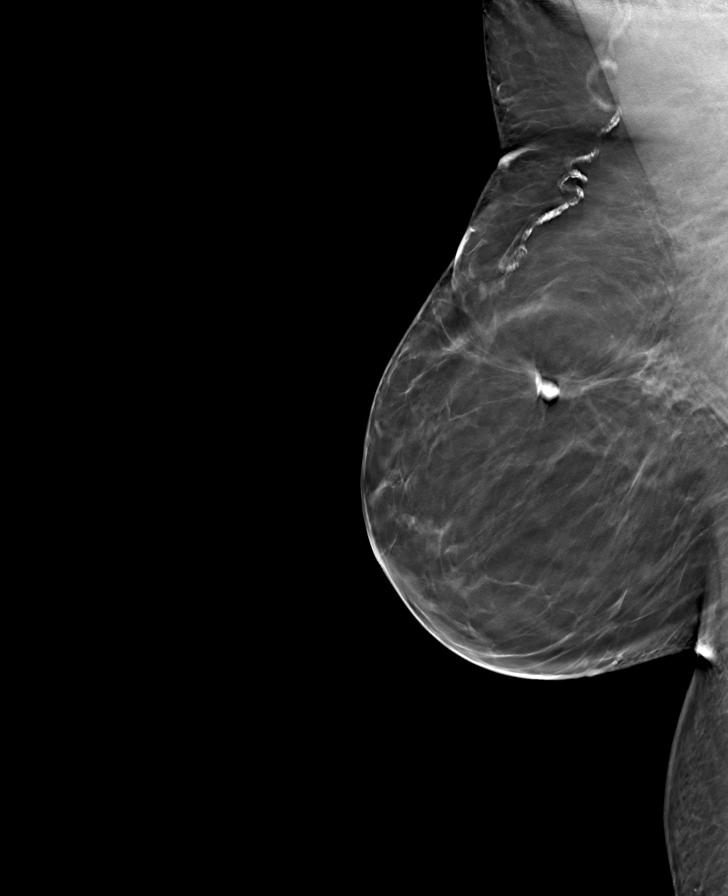

[L CC tomo · tomo slice 35/69.0]
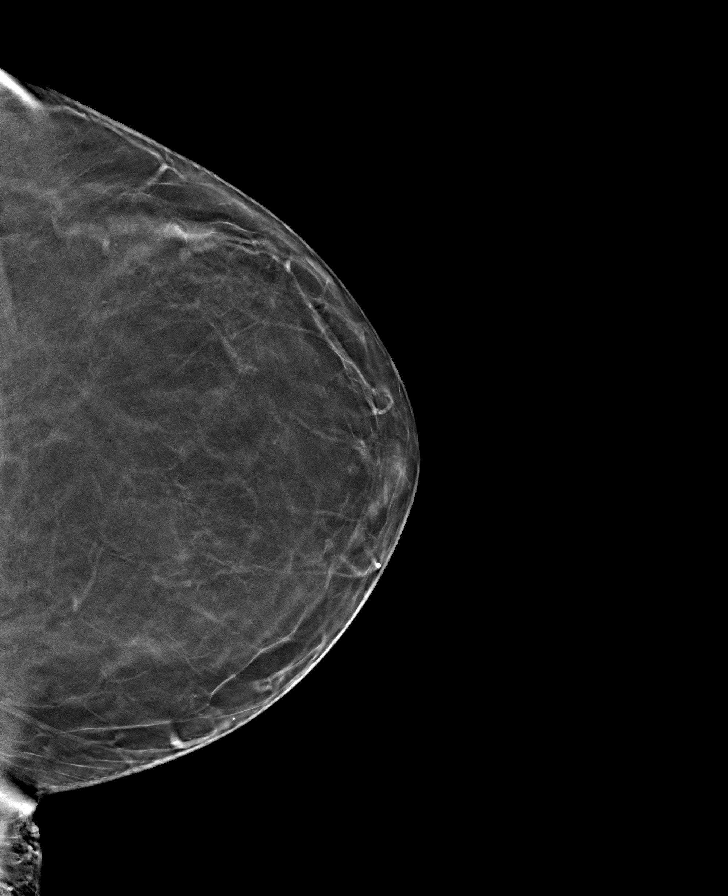

[L MLO tomo · tomo slice 39/78.0]
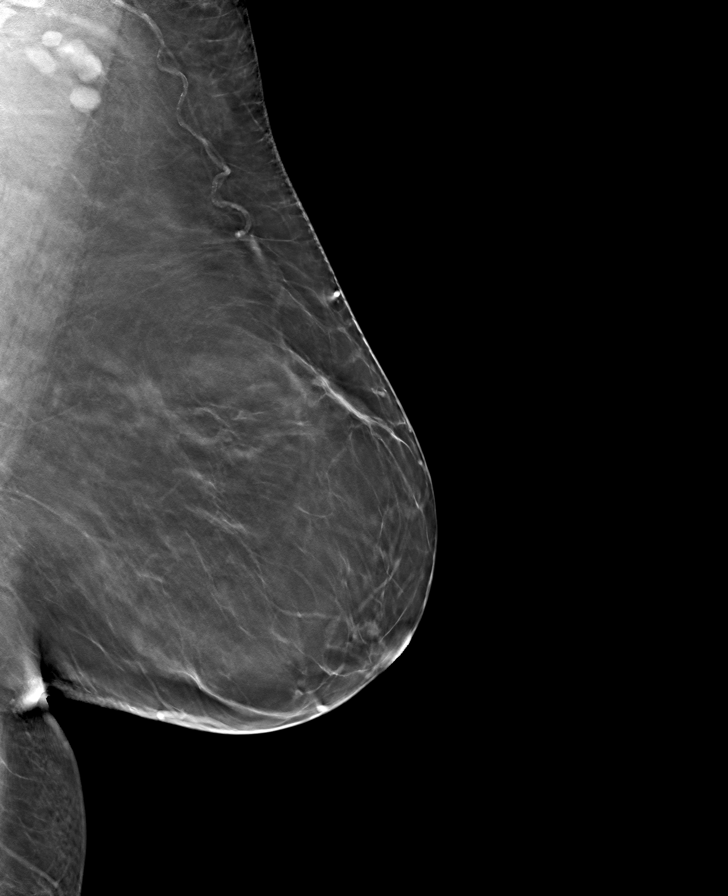

[8 of 24 positions shown; findings below may reference images not displayed]

ACR Breast Density Category b: There are scattered areas of
fibroglandular density.
FINDINGS: There are no findings suspicious for malignancy. Stable post
lumpectomy changes in the RIGHT breast. Images were processed with
CAD.
IMPRESSION: No mammographic evidence of malignancy. A result letter of this
screening mammogram will be mailed directly to the patient.

RECOMMENDATION:
Screening mammogram in one year. (Code:W2-2-E2S)

BI-RADS CATEGORY  2: Benign.

## 2020-07-27 ENCOUNTER — Ambulatory Visit
Admission: EM | Admit: 2020-07-27 | Discharge: 2020-07-27 | Disposition: A | Discharge status: Home / Self Care | Payer: PPO | Attending: Emergency Medicine | Admitting: Emergency Medicine

## 2020-07-27 ENCOUNTER — Other Ambulatory Visit: Payer: Self-pay

## 2020-07-27 DIAGNOSIS — R109 Unspecified abdominal pain: Secondary | ICD-10-CM | POA: Insufficient documentation

## 2020-07-27 LAB — POCT URINALYSIS DIP (MANUAL ENTRY)
Bilirubin, UA: NEGATIVE
Glucose, UA: NEGATIVE mg/dL
Ketones, POC UA: NEGATIVE mg/dL
Leukocytes, UA: NEGATIVE
Nitrite, UA: NEGATIVE
Protein Ur, POC: NEGATIVE mg/dL
Spec Grav, UA: 1.02 (ref 1.010–1.025)
Urobilinogen, UA: 0.2 E.U./dL
pH, UA: 6 (ref 5.0–8.0)

## 2020-07-27 MED ORDER — TAMSULOSIN HCL 0.4 MG PO CAPS: 0.4000 mg | ORAL_CAPSULE | Freq: Every day | ORAL | 0 refills | Status: DC

## 2020-07-27 NOTE — Discharge Instructions (Addendum)
Urine culture sent.  We will call you with the results.   Push fluids and get plenty of rest.   Flomax was prescribed for possible kidney stone Continue to take Tylenol as needed for pain Follow up with PCP if symptoms persists Return here or go to ER if you have any new or worsening symptoms such as fever, worsening abdominal pain, nausea/vomiting, flank pain, etc..Marland Kitchen

## 2020-07-27 NOTE — ED Provider Notes (Signed)
[facility]   Chief Complaint  Patient presents with  . Flank Pain     SUBJECTIVE:  Desiree [name] is a 74 y.o. female who presented to the urgent care for complaint of left flank pain for the past 1 week..  Patient denies a precipitating event, recent sexual encounter, excessive caffeine intake.  Localizes the pain to the left flank.  Pain is i is constant and aching character.  Has tried OTC  Tylenol without relief.  Symptoms are made worse with urination.  Denies similar symptoms in the past.  Denies fever, chills, nausea, vomiting, abdominal pain,  abnormal vaginal discharge or bleeding, hematuria.    LMP: No LMP recorded. Patient has had a hysterectomy.  ROS: As in HPI.  All other pertinent ROS negative.     Past Medical History:  Diagnosis Date  . Allergic rhinitis   . Arthritis HIPS/ WRISTS  . Cancer ([place])   . DDD (degenerative disc disease), cervical   . GERD (gastroesophageal reflux disease) OCCASIONAL  . Heart murmur    turbulent flow through LVOT, no obstruction 08/26/18 echo  . History of Bell's palsy 2011  RIGHT SIDE -- RESOLVED  . 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 History of CVA (cerebrovascular accident) PER SCAN IN 2011  . Hyperlipidemia   . Hypertension   . Impaired fasting glucose PER PCP  DR SIMPSON   WATCH DIET  . Mixed urge and stress incontinence   . Nocturia   . Sinus drainage   . Thoracic ascending aortic aneurysm ([place]) LAST CHEST CT 09-02-2010--  FOLLOWED BY  CARDIOLOGIST--  DR CHENIDPO  . Vaginal wall prolapse    Past Surgical History:  Procedure Laterality Date  . ABDOMINAL HYSTERECTOMY    . BILATERAL CARPAL TUNNEL RELEASE  1980'S  . CARDIAC CATHETERIZATION  02-01-2009  DR EICHHORN   NORMAL CORONARY ARTERIES/ NORMAL LV SIZE AND FUNCTION/ AORTIC ROOT SIZE AT THE UPPER LIMIT OF NORMAL   . CARPAL TUNNEL RELEASE    . CERVICAL DISC SURGERY  12-12-2005  DR Vertell Limber     LEFT  C6 - C7 HERINATED / DDD/ SPONDYLOSIS  . COLONOSCOPY N/A 02/10/2014   Procedure: COLONOSCOPY;  Surgeon: Danie Binder, MD;  [facility];  Service: Endoscopy;  Laterality: N/A;  8:30  . CYSTOCELE REPAIR  08/02/2012   Procedure: ANTERIOR REPAIR (CYSTOCELE);  Surgeon: Ailene Rud, MD;  Location: [facility];  Service: Urology;  Laterality: N/A;  [employer] Uphold Anterior Pelvic Floor Sacrospinus Repair. Anterior wall of the vagina.  . ESOPHAGOGASTRODUODENOSCOPY N/A 02/10/2014   Procedure: ESOPHAGOGASTRODUODENOSCOPY (EGD);  Surgeon: Danie Binder, MD;  [facility];  Service: Endoscopy;  Laterality: N/A;  . GIVENS CAPSULE STUDY N/A 03/13/2014   Procedure: GIVENS CAPSULE STUDY;  Surgeon: Danie Binder, MD;  [facility];  Service: Endoscopy;  Laterality: N/A;  7:30  . JOINT REPLACEMENT N/A    Phreesia 06/16/2020  . NECK SURGERY  2007  . RIGHT PARTIAL MASTECTOMY  11-11-2004  DR [name] [name]   DUCTAL CARCINOMA IN SITU RIGHT BREAST  . TOTAL ABDOMINAL HYSTERECTOMY W/ BILATERAL SALPINGOOPHORECTOMY  1990  . TOTAL KNEE ARTHROPLASTY Right 12/21/2019   Procedure: RIGHT  TOTAL KNEE ARTHROPLASTY;  Surgeon: [name], MD;  Location: [facility];  Service: Orthopedics;  Laterality: Right;  . TRANSTHORACIC ECHOCARDIOGRAM  03-30-2008  DR MARGARET SIMPSON   LV  SIZE AND FUNCTION NORMAL/ MODERATE AORTIC ARCH DILATATION/ MILD MR   Allergies  Allergen Reactions  . Codeine Other (See Comments)    CONFUSION/ DIZZY  . Quinolones Other (See Comments)    unknown   No current facility-administered medications on file prior to encounter.   Current Outpatient Medications on File Prior to Encounter  Medication Sig Dispense Refill  . acetaminophen (TYLENOL) 500 MG tablet Take one to  two tablets by mouth at night for pain 50 tablet 0  . Artificial Tear Solution (SOOTHE XP OP) Place 2 drops into both ears daily as needed (Dry eye).    . Ascorbic Acid  (VITAMIN C) 1000 MG tablet Take 1,000 mg by mouth daily.    Marland Kitchen aspirin EC 325 MG tablet Take 1 tablet (325 mg total) by mouth daily. 100 tablet 3  . betamethasone dipropionate (DIPROLENE) 0.05 % cream Apply sparingly two times daily for 10 days, then as needed, to affected areas( hairline, neck) 45 g 1  . calcium-vitamin D (OSCAL 500/200 D-3) 500-200 MG-UNIT per tablet Take 1 tablet by mouth daily with breakfast.     . carvedilol (COREG) 3.125 MG tablet Take 1 tablet (3.125 mg total) by mouth 2 (two) times daily with a meal. 180 tablet 3  . Cetirizine HCl 10 MG TBDP Take 1 tablet by mouth daily. (Patient taking differently: Take 10 mg by mouth daily. ) 90 tablet 3  . Cholecalciferol (VITAMIN D) 50 MCG (2000 UT) tablet Take 2,000 Units by mouth daily.     Marland Kitchen docusate sodium (COLACE) 100 MG capsule Take 100 mg by mouth daily as needed for mild constipation.    . gabapentin (NEURONTIN) 100 MG capsule TAKE 2 CAPSULES BY MOUTH ONCE DAILY AT BEDTIME 180 capsule 0  . guaifenesin (HUMIBID E) 400 MG TABS tablet Take 400 mg by mouth daily as needed (Mucus build up). Mucus relief    . hydrochlorothiazide (HYDRODIURIL) 25 MG tablet TAKE 1 TABLET BY MOUTH ONCE DAILY IN THE MORNING 90 tablet 1  . ibuprofen (ADVIL) 800 MG tablet Take one tablet by mouth at bedtime , as needed, for right knee pain. Maximum is 4 tablets per week. 30 tablet 0  . losartan (COZAAR) 50 MG tablet Take 1/2 (one-half) tablet by mouth once daily 45 tablet 3  . lovastatin (MEVACOR) 40 MG tablet Take 1 tablet (40 mg total) by mouth daily. 90 tablet 2  . MAGNESIUM PO Take by mouth every other day.    . montelukast (SINGULAIR) 10 MG tablet TAKE 1 TABLET BY MOUTH AT BEDTIME 90 tablet 0  . Multiple Vitamin (MULTIVITAMIN) tablet Take 1 tablet by mouth daily. One a day woman's    . Olopatadine HCl 0.2 % SOLN Apply 1 drop to eye daily.    . potassium chloride (KLOR-CON) 10 MEQ tablet Take 1 tablet by mouth once daily 30 tablet 0  . zinc gluconate 50  MG tablet Take 50 mg by mouth daily.    . Ferrous Sulfate (SLOW FE) 142 (45 Fe) MG TBCR Take 142 mg by mouth daily. (Patient taking differently: Take 142 mg by mouth every other day. ) 30 tablet 1  . methocarbamol (ROBAXIN) 500 MG tablet TAKE 1 TABLET BY MOUTH EVERY 8 HOURS AS NEEDED FOR MUSCLE SPASM 30 tablet 0  . olopatadine (PATANOL) 0.1 % ophthalmic solution Place 1 drop into both eyes daily.    . [DISCONTINUED] amLODipine (NORVASC) 10 MG tablet Take 1 tablet (10 mg total) by mouth daily. 90 tablet  1   Social History   Socioeconomic History  . Marital status: Divorced    Spouse name: Not on file  . Number of children: 4  . Years of education: 12+  . Highest education level: 12th grade  Occupational History  . Not on file  Tobacco Use  . Smoking status: Never Smoker  . Smokeless tobacco: Never Used  Vaping Use  . Vaping Use: Never used  Substance and Sexual Activity  . Alcohol use: No  . Drug use: No  . Sexual activity: Not Currently  Other Topics Concern  . Not on file  Social History Narrative   RETIRED IN June 2020.    Social Determinants of Health   Financial Resource Strain: Low Risk   . Difficulty of Paying Living Expenses: Not very hard  Food Insecurity: No Food Insecurity  . Worried About Charity fundraiser in the Last Year: Never true  . Ran Out of Food in the Last Year: Never true  Transportation Needs: No Transportation Needs  . Lack of Transportation (Medical): No  . Lack of Transportation (Non-Medical): No  Physical Activity: Sufficiently Active  . Days of Exercise per Week: 4 days  . Minutes of Exercise per Session: 40 min  Stress: No Stress Concern Present  . Feeling of Stress : Not at all  Social Connections: Moderately Integrated  . Frequency of Communication with Friends and Family: More than three times a week  . Frequency of Social Gatherings with Friends and Family: Three times a week  . Attends Religious Services: More than 4 times per year    . Active Member of Clubs or Organizations: Yes  . Attends Archivist Meetings: More than 4 times per year  . Marital Status: Divorced  Human resources officer Violence: Not At Risk  . Fear of Current or Ex-Partner: No  . Emotionally Abused: No  . Physically Abused: No  . Sexually Abused: No   Family History  Problem Relation Age of Onset  . Kidney failure Mother   . Hypertension Mother   . Heart attack Mother 15  . Throat cancer Father   . Prostate cancer Father   . Lung cancer Father 75  . Breast cancer Sister   . Hypertension Brother        2 stents  . Colon cancer Neg Hx   . Colon polyps Neg Hx     OBJECTIVE:  Vitals:   07/27/20 1236  BP: (!) 148/79  Pulse: 65  Resp: 16  Temp: 98.7 F (37.1 C)  SpO2: 98%   General appearance: AOx3 in no acute distress HEENT: NCAT.  Oropharynx clear.  Lungs: clear to auscultation bilaterally without adventitious breath sounds Heart: regular rate and rhythm.  Radial pulses 2+ symmetrical bilaterally Abdomen: soft; non-distended; no tenderness; bowel sounds present; no guarding or rebound tenderness Back: no CVA tenderness Extremities: no edema; symmetrical with no gross deformities Skin: warm and dry Neurologic: Ambulates from chair to exam table without difficulty Psychological: alert and cooperative; normal mood and affect  Labs Reviewed  POCT URINALYSIS DIP (MANUAL ENTRY) - Abnormal; Notable for the following components:      Result Value   Blood, UA trace-intact (*)    All other components within normal limits  URINE CULTURE    ASSESSMENT & PLAN:  1. Left flank pain     Meds ordered this encounter  Medications  . tamsulosin (FLOMAX) 0.4 MG CAPS capsule    Sig: Take 1 capsule (0.4 mg total)  by mouth daily after breakfast.    Dispense:  30 capsule    Refill:  0   Patient is stable at discharge.  There is a concern of noninfected kidney stone.  Flomax will be prescribed.  She was advised to continue to take  Tylenol as needed for pain  Discharge instructions   Urine culture sent.  We will call you with the results.   Push fluids and get plenty of rest.   Flomax was prescribed for possible kidney stone Continue to take Tylenol as needed for pain Follow up with PCP if symptoms persists Return here or go to ER if you have any new or worsening symptoms such as fever, worsening abdominal pain, nausea/vomiting, flank pain, etc...  Outlined signs and symptoms indicating need for more acute intervention. Patient verbalized understanding. After Visit Summary given.     Emerson Monte, FNP 07/27/20 1340

## 2020-07-27 NOTE — ED Triage Notes (Signed)
Pt presents with left lower back/flank pain worsening x 1 week.  States area was swollen.  Somewhat tender to touch.  Had a severe kidney infection in the 70s but no other kidney issues.  Denies all urinary symptoms, denies fever/chills.  Pain worse with leaning forward.

## 2020-07-30 ENCOUNTER — Telehealth (HOSPITAL_COMMUNITY): Payer: Self-pay | Admitting: Emergency Medicine

## 2020-07-30 LAB — URINE CULTURE: Culture: >=100000 — AB

## 2020-07-31 ENCOUNTER — Encounter: Payer: Self-pay | Admitting: Family Medicine

## 2020-07-31 ENCOUNTER — Ambulatory Visit (INDEPENDENT_AMBULATORY_CARE_PROVIDER_SITE_OTHER): Payer: PPO | Admitting: Family Medicine

## 2020-07-31 ENCOUNTER — Other Ambulatory Visit: Payer: Self-pay

## 2020-07-31 VITALS — BP 130/82 | HR 101 | Resp 16 | Ht 61.5 in | Wt 186.0 lb

## 2020-07-31 DIAGNOSIS — I10 Essential (primary) hypertension: Secondary | ICD-10-CM | POA: Diagnosis not present

## 2020-07-31 DIAGNOSIS — E669 Obesity, unspecified: Secondary | ICD-10-CM | POA: Diagnosis not present

## 2020-07-31 DIAGNOSIS — N3000 Acute cystitis without hematuria: Secondary | ICD-10-CM | POA: Diagnosis not present

## 2020-07-31 DIAGNOSIS — R109 Unspecified abdominal pain: Secondary | ICD-10-CM

## 2020-07-31 MED ORDER — CEFTRIAXONE SODIUM 500 MG IJ SOLR
500.0000 mg | Freq: Once | INTRAMUSCULAR | Status: AC
  Administered 2020-07-31: 500 mg via INTRAMUSCULAR

## 2020-07-31 NOTE — Patient Instructions (Addendum)
F/u in office ,with mD,  re evaluate flank pain and UTI in 2 weeks, call if you need me sooner  Rocephin 500 mg IM in office for infection and 1 week course of nitrofurantoin is prescribed  Do not take tamulosin  Drink water often, minimum of 64 ounces every day and empty bladder often  Urine culture shows that you have a urinary tract infection   Urinary Tract Infection, Adult A urinary tract infection (UTI) is an infection of any part of the urinary tract. The urinary tract includes:  The kidneys.  The ureters.  The bladder.  The urethra. These organs make, store, and get rid of pee (urine) in the body. What are the causes? This is caused by germs (bacteria) in your genital area. These germs grow and cause swelling (inflammation) of your urinary tract. What increases the risk? You are more likely to develop this condition if:  You have a small, thin tube (catheter) to drain pee.  You cannot control when you pee or poop (incontinence).  You are female, and: ? You use these methods to prevent pregnancy:  A medicine that kills sperm (spermicide).  A device that blocks sperm (diaphragm). ? You have low levels of a female hormone (estrogen). ? You are pregnant.  You have genes that add to your risk.  You are sexually active.  You take antibiotic medicines.  You have trouble peeing because of: ? A prostate that is bigger than normal, if you are female. ? A blockage in the part of your body that drains pee from the bladder (urethra). ? A kidney stone. ? A nerve condition that affects your bladder (neurogenic bladder). ? Not getting enough to drink. ? Not peeing often enough.  You have other conditions, such as: ? Diabetes. ? A weak disease-fighting system (immune system). ? Sickle cell disease. ? Gout. ? Injury of the spine. What are the signs or symptoms? Symptoms of this condition include:  Needing to pee right away (urgently).  Peeing often.  Peeing  small amounts often.  Pain or burning when peeing.  Blood in the pee.  Pee that smells bad or not like normal.  Trouble peeing.  Pee that is cloudy.  Fluid coming from the vagina, if you are female.  Pain in the belly or lower back. Other symptoms include:  Throwing up (vomiting).  No urge to eat.  Feeling mixed up (confused).  Being tired and grouchy (irritable).  A fever.  Watery poop (diarrhea). How is this treated? This condition may be treated with:  Antibiotic medicine.  Other medicines.  Drinking enough water. Follow these instructions at home:  Medicines  Take over-the-counter and prescription medicines only as told by your doctor.  If you were prescribed an antibiotic medicine, take it as told by your doctor. Do not stop taking it even if you start to feel better. General instructions  Make sure you: ? Pee until your bladder is empty. ? Do not hold pee for a long time. ? Empty your bladder after sex. ? Wipe from front to back after pooping if you are a female. Use each tissue one time when you wipe.  Drink enough fluid to keep your pee pale yellow.  Keep all follow-up visits as told by your doctor. This is important. Contact a doctor if:  You do not get better after 1-2 days.  Your symptoms go away and then come back. Get help right away if:  You have very bad back pain.  You have very bad pain in your lower belly.  You have a fever.  You are sick to your stomach (nauseous).  You are throwing up. Summary  A urinary tract infection (UTI) is an infection of any part of the urinary tract.  This condition is caused by germs in your genital area.  There are many risk factors for a UTI. These include having a small, thin tube to drain pee and not being able to control when you pee or poop.  Treatment includes antibiotic medicines for germs.  Drink enough fluid to keep your pee pale yellow. This information is not intended to replace  advice given to you by your health care provider. Make sure you discuss any questions you have with your health care provider. Document Revised: 07/29/2018 Document Reviewed: 02/18/2018 Elsevier Patient Education  2020 [employer].

## 2020-07-31 NOTE — Progress Notes (Signed)
   Desiree [name]     MRN: [id number]      DOB: 1946/03/25   HPI Ms. Kurkowski is here for follow up of urgent care visit  5 days ago withy flank pain treated with tamulosin, concern for stone. Has establisshed uTI, Pain is down from a a 10 to a 5 , no fever , chills , nausea or vomit. Still has has some discomfort with urination ROS Denies recent fever or chills. Denies sinus pressure, nasal congestion, ear pain or sore throat. Denies chest congestion, productive cough or wheezing. Denies chest pains, palpitations and leg swelling Denies abdominal pain, nausea, vomiting,diarrhea or constipation.   Denies joint pain, swelling and limitation in mobility. Denies headaches, seizures, numbness, or tingling. Denies depression, anxiety or insomnia. Denies skin break down or rash.   PE  BP 130/82   Pulse (!) 101   Resp 16   Ht 5' 1.5" (1.562 m)   Wt 186 lb (84.4 kg)   SpO2 94%   BMI 34.58 kg/m    Patient alert and oriented and in no cardiopulmonary distress.  HEENT: No facial asymmetry, EOMI,     Neck supple .  Chest: Clear to auscultation bilaterally.  CVS: S1, S2 no murmurs, no S3.Regular rate.  ABD: Soft mild suprapubic tenderness , no flank tenderness.   Ext: No edema  MS: Adequate though reduced  ROM spine, shoulders, hips and knees.  Skin: Intact, no ulcerations or rash noted.  Psych: Good eye contact, normal affect. Memory intact not anxious or depressed appearing.  CNS: CN 2-12 intact, power,  normal throughout.no focal deficits noted.   Assessment & Plan  Acute left flank pain 12 day history, needs [place] as pain is disproportionate to UTI symptoms  Acute cystitis without hematuria  Established UTI.  Rocephin 500 mg IM in office followed by a 1 week course of nitrofurantoin.   Essential hypertension Controlled, no change in medication DASH diet and commitment to daily physical activity for a minimum of 30 minutes discussed and encouraged, as a part of  hypertension management. The importance of attaining a healthy weight is also discussed.  BP/Weight 07/31/2020 07/27/2020 06/19/2020 03/15/2020 02/20/2020 0/05/2724 10/29/6438  Systolic BP 347 425 956 - 387 564 332  Diastolic BP 82 79 80 - 80 71 70  Wt. (Lbs) 186 - 185 185 187 188 188.6  BMI 34.58 - 33.84 33.84 34.2 35.52 34.5       Obesity (BMI 30-39.9)  Patient re-educated about  the importance of commitment to a  minimum of 150 minutes of exercise per week as able.  The importance of healthy food choices with portion control discussed, as well as eating regularly and within a 12 hour window most days. The need to choose "clean , green" food 50 to 75% of the time is discussed, as well as to make water the primary drink and set a goal of 64 ounces water daily.    Weight /BMI 07/31/2020 06/19/2020 03/15/2020  WEIGHT 186 lb 185 lb 185 lb  HEIGHT 5' 1.5" 5\' 2"  5\' 2"   BMI 34.58 kg/m2 33.84 kg/m2 33.84 kg/m2

## 2020-07-31 NOTE — Assessment & Plan Note (Signed)
12 day history, needs [place] as pain is disproportionate to UTI symptoms

## 2020-08-01 MED ORDER — NITROFURANTOIN MONOHYD MACRO 100 MG PO CAPS
100.0000 mg | ORAL_CAPSULE | Freq: Two times a day (BID) | ORAL | 0 refills | Status: DC
Start: 2020-08-01 — End: 2020-08-16

## 2020-08-05 ENCOUNTER — Encounter: Payer: Self-pay | Admitting: Family Medicine

## 2020-08-05 NOTE — Assessment & Plan Note (Signed)
Controlled, no change in medication DASH diet and commitment to daily physical activity for a minimum of 30 minutes discussed and encouraged, as a part of hypertension management. The importance of attaining a healthy weight is also discussed.  BP/Weight 07/31/2020 07/27/2020 06/19/2020 03/15/2020 02/20/2020 5/79/0383 10/26/8327  Systolic BP 191 660 600 - 459 977 414  Diastolic BP 82 79 80 - 80 71 70  Wt. (Lbs) 186 - 185 185 187 188 188.6  BMI 34.58 - 33.84 33.84 34.2 35.52 34.5

## 2020-08-05 NOTE — Assessment & Plan Note (Signed)
  Patient re-educated about  the importance of commitment to a  minimum of 150 minutes of exercise per week as able.  The importance of healthy food choices with portion control discussed, as well as eating regularly and within a 12 hour window most days. The need to choose "clean , green" food 50 to 75% of the time is discussed, as well as to make water the primary drink and set a goal of 64 ounces water daily.    Weight /BMI 07/31/2020 06/19/2020 03/15/2020  WEIGHT 186 lb 185 lb 185 lb  HEIGHT 5' 1.5" 5\' 2"  5\' 2"   BMI 34.58 kg/m2 33.84 kg/m2 33.84 kg/m2

## 2020-08-05 NOTE — Assessment & Plan Note (Signed)
°  Established UTI.  Rocephin 500 mg IM in office followed by a 1 week course of nitrofurantoin.

## 2020-08-06 ENCOUNTER — Other Ambulatory Visit: Payer: Self-pay

## 2020-08-06 ENCOUNTER — Ambulatory Visit (HOSPITAL_COMMUNITY)
Admission: RE | Admit: 2020-08-06 | Discharge: 2020-08-06 | Disposition: A | Discharge status: Home / Self Care | Payer: PPO | Source: Ambulatory Visit | Attending: Family Medicine | Admitting: Family Medicine

## 2020-08-06 DIAGNOSIS — R109 Unspecified abdominal pain: Secondary | ICD-10-CM | POA: Diagnosis not present

## 2020-08-06 DIAGNOSIS — I10 Essential (primary) hypertension: Secondary | ICD-10-CM | POA: Insufficient documentation

## 2020-08-06 DIAGNOSIS — N281 Cyst of kidney, acquired: Secondary | ICD-10-CM | POA: Diagnosis not present

## 2020-08-09 ENCOUNTER — Other Ambulatory Visit: Payer: Self-pay

## 2020-08-09 ENCOUNTER — Ambulatory Visit (INDEPENDENT_AMBULATORY_CARE_PROVIDER_SITE_OTHER): Payer: PPO | Admitting: Cardiothoracic Surgery

## 2020-08-09 ENCOUNTER — Ambulatory Visit
Admission: RE | Admit: 2020-08-09 | Discharge: 2020-08-09 | Disposition: A | Discharge status: Home / Self Care | Payer: PPO | Source: Ambulatory Visit | Attending: Cardiothoracic Surgery | Admitting: Cardiothoracic Surgery

## 2020-08-09 VITALS — BP 140/88 | HR 96 | Temp 97.6°F | Resp 20 | Ht 61.5 in | Wt 188.0 lb

## 2020-08-09 DIAGNOSIS — I712 Thoracic aortic aneurysm, without rupture, unspecified: Secondary | ICD-10-CM

## 2020-08-09 DIAGNOSIS — J8410 Pulmonary fibrosis, unspecified: Secondary | ICD-10-CM | POA: Diagnosis not present

## 2020-08-09 DIAGNOSIS — J9811 Atelectasis: Secondary | ICD-10-CM | POA: Diagnosis not present

## 2020-08-09 DIAGNOSIS — I700 Atherosclerosis of aorta: Secondary | ICD-10-CM | POA: Diagnosis not present

## 2020-08-09 MED ORDER — IOPAMIDOL (ISOVUE-370) INJECTION 76%
75.0000 mL | Freq: Once | INTRAVENOUS | Status: AC | PRN
  Administered 2020-08-09: 75 mL via INTRAVENOUS

## 2020-08-09 NOTE — Progress Notes (Signed)
[address]       [place] [address]             [place] Record #[id number] Date of Birth: 10/25/1945  Referring: [name], MD Primary Care: [name], MD Cardiology: Dr  Bertrum Sol  Chief Complaint:    Follow up cta of chest    History of Present Illness:    Patient is a 74 -year-old female comes today for follow up cta of the chest  who has had a known dilated ascending aorta since at least 2009 with serial CT scans of the chest having been performed. She was  referred to me several years ago after a recent CT scan showed 4.4 cm dilated ascending aorta.   Previous CT scans are reviewed with descending aorta 4.3 cm 2009   Patient has not family history of aortic dissection or ascending aortic aneurysms  Current Activity/ Functional Status:  Patient is independent with mobility/ambulation, transfers, ADL's, IADL's.  Zubrod Score: At the time of surgery this patient's most appropriate activity status/level should be described as: []  Normal activity, no symptoms [x]  Symptoms, fully ambulatory []  Symptoms, in bed less than or equal to 50% of the time []  Symptoms, in bed greater than 50% of the time but less than 100% []  Bedridden []  Moribund   Past Medical History:  Diagnosis Date  . Allergic rhinitis   . Arthritis HIPS/ WRISTS  . Cancer ([place])   . DDD (degenerative disc disease), cervical   . GERD (gastroesophageal reflux disease) OCCASIONAL  . Heart murmur    turbulent flow through LVOT, no obstruction 08/26/18 echo  . History of Bell's palsy 2011  RIGHT SIDE -- RESOLVED  . 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 History of CVA (cerebrovascular accident) PER SCAN IN 2011  . Hyperlipidemia   . Hypertension   . Impaired fasting glucose PER PCP  DR SIMPSON   WATCH DIET  . Mixed urge and stress  incontinence   . Nocturia   . Sinus drainage   . Thoracic ascending aortic aneurysm ([place]) LAST CHEST CT 09-02-2010--  FOLLOWED BY  CARDIOLOGIST--  DR [id number]  . Vaginal wall prolapse     Past Surgical History:  Procedure Laterality Date  . ABDOMINAL HYSTERECTOMY    . BILATERAL CARPAL TUNNEL RELEASE  1980'S  . CARDIAC CATHETERIZATION  02-01-2009  DR EICHHORN   NORMAL CORONARY ARTERIES/ NORMAL LV SIZE AND FUNCTION/ AORTIC ROOT SIZE AT THE UPPER LIMIT OF NORMAL   . CARPAL TUNNEL RELEASE    . CERVICAL DISC SURGERY  12-12-2005  DR Vertell Limber   LEFT  C6 - C7 HERINATED / DDD/ SPONDYLOSIS  . COLONOSCOPY N/A 02/10/2014   Procedure: COLONOSCOPY;  Surgeon: Danie Binder, MD;  [facility];  Service: Endoscopy;  Laterality: N/A;  8:30  . CYSTOCELE REPAIR  08/02/2012   Procedure: ANTERIOR REPAIR (CYSTOCELE);  Surgeon: Ailene Rud, MD;  Location: [facility];  Service: Urology;  Laterality: N/A;  [employer] Uphold Anterior Pelvic Floor Sacrospinus Repair. Anterior wall of the vagina.  . ESOPHAGOGASTRODUODENOSCOPY N/A 02/10/2014   Procedure: ESOPHAGOGASTRODUODENOSCOPY (EGD);  Surgeon: Danie Binder, MD;  [facility];  Service: Endoscopy;  Laterality: N/A;  .  GIVENS CAPSULE STUDY N/A 03/13/2014   Procedure: GIVENS CAPSULE STUDY;  Surgeon: Danie Binder, MD;  [facility];  Service: Endoscopy;  Laterality: N/A;  7:30  . JOINT REPLACEMENT N/A    Phreesia 06/16/2020  . NECK SURGERY  2007  . RIGHT PARTIAL MASTECTOMY  11-11-2004  DR [name] YOUNG   DUCTAL CARCINOMA IN SITU RIGHT BREAST  . TOTAL ABDOMINAL HYSTERECTOMY W/ BILATERAL SALPINGOOPHORECTOMY  1990  . TOTAL KNEE ARTHROPLASTY Right 12/21/2019   Procedure: RIGHT  TOTAL KNEE ARTHROPLASTY;  Surgeon: [name], MD;  Location: [facility];  Service: Orthopedics;  Laterality: Right;  . TRANSTHORACIC ECHOCARDIOGRAM  03-30-2008  DR MARGARET SIMPSON   LV SIZE AND FUNCTION NORMAL/ MODERATE AORTIC ARCH  DILATATION/ MILD MR    Family History  Problem Relation Age of Onset  . Heart attack Mother 12  . Kidney failure Mother   . Hypertension Mother   . Throat cancer Father 52  . Prostate cancer Father     History   Social History  . Marital Status: Divorced    Spouse Name: N/A    Number of Children: 74  . Years of Education: N/A   Occupational History  . Not on file.   Social History Main Topics  . Smoking status: Never Smoker   . Smokeless tobacco: Never Used  . Alcohol Use: No  . Drug Use: No  . Sexual Activity: Not on file   Other Topics Concern  . 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 Codeine Other (See Comments)    CONFUSION/ DIZZY  . Quinolones Other (See Comments)    unknown    Current Outpatient Medications  Medication Sig Dispense Refill  . acetaminophen (TYLENOL) 500 MG tablet Take one to  two tablets by mouth at night for pain 50 tablet 0  . Artificial Tear Solution (SOOTHE XP OP) Place 2 drops into both ears daily as needed (Dry eye).    . Ascorbic Acid (VITAMIN C) 1000 MG tablet Take 1,000 mg by mouth daily.    Marland Kitchen aspirin EC 325 MG tablet Take 1 tablet (325 mg total) by mouth daily. 100 tablet 3  . betamethasone dipropionate (DIPROLENE) 0.05 % cream Apply sparingly two times daily for 10 days, then as needed, to affected areas( hairline, neck) 45 g 1  . calcium-vitamin D (OSCAL WITH D) 500-200 MG-UNIT tablet Take 1 tablet by mouth daily with breakfast.    . carvedilol (COREG) 3.125 MG tablet Take 1 tablet (3.125 mg total) by mouth 2 (two) times daily with a meal. 180 tablet 3  . Cetirizine HCl 10 MG TBDP Take 1 tablet by mouth daily. (Patient taking differently: Take 10 mg by mouth daily.) 90 tablet 3  .  Cholecalciferol (VITAMIN D) 50 MCG (2000 UT) tablet Take 2,000 Units by mouth daily.     Marland Kitchen docusate sodium (COLACE) 100 MG capsule Take 100 mg by mouth daily as needed for mild constipation.    . Ferrous Sulfate (SLOW FE) 142 (45 Fe) MG TBCR Take 142 mg by mouth daily. (Patient taking differently: Take 142 mg by mouth every other day.) 30 tablet 1  . gabapentin (NEURONTIN) 100 MG capsule TAKE  2 CAPSULES BY MOUTH ONCE DAILY AT BEDTIME 180 capsule 0  . guaifenesin (HUMIBID E) 400 MG TABS tablet Take 400 mg by mouth daily as needed (Mucus build up). Mucus relief    . hydrochlorothiazide (HYDRODIURIL) 25 MG tablet TAKE 1 TABLET BY MOUTH ONCE DAILY IN THE MORNING 90 tablet 1  . ibuprofen (ADVIL) 800 MG tablet Take one tablet by mouth at bedtime , as needed, for right knee pain. Maximum is 4 tablets per week. 30 tablet 0  . losartan (COZAAR) 50 MG tablet Take 1/2 (one-half) tablet by mouth once daily 45 tablet 3  . lovastatin (MEVACOR) 40 MG tablet Take 1 tablet (40 mg total) by mouth daily. 90 tablet 2  . MAGNESIUM PO Take by mouth every other day.    . methocarbamol (ROBAXIN) 500 MG tablet TAKE 1 TABLET BY MOUTH EVERY 8 HOURS AS NEEDED FOR MUSCLE SPASM 30 tablet 0  . montelukast (SINGULAIR) 10 MG tablet TAKE 1 TABLET BY MOUTH AT BEDTIME 90 tablet 0  . Multiple Vitamin (MULTIVITAMIN) tablet Take 1 tablet by mouth daily. One a day woman's    . nitrofurantoin, macrocrystal-monohydrate, (MACROBID) 100 MG capsule Take 1 capsule (100 mg total) by mouth 2 (two) times daily. 14 capsule 0  . olopatadine (PATANOL) 0.1 % ophthalmic solution Place 1 drop into both eyes daily.    . potassium chloride (KLOR-CON) 10 MEQ tablet Take 1 tablet by mouth once daily 30 tablet 0  . zinc gluconate 50 MG tablet Take 50 mg by mouth daily.     No current facility-administered medications for this visit.     Physical Exam: BP 140/88   Pulse 96   Temp 97.6 F (36.4 C) (Skin)   Resp 20   Ht 5' 1.5" (1.562 m)   Wt 188 lb  (85.3 kg)   SpO2 96% Comment: RA  BMI 34.95 kg/m   General appearance: alert, cooperative and appears stated age Resp: clear to auscultation bilaterally Cardio: regular rate and rhythm, S1, S2 normal, no murmur, click, rub or gallop Extremities: extremities normal, atraumatic, no cyanosis or edema Neurologic: Grossly normal  Diagnostic Studies & Laboratory data:   ECHO: *[facility] [place], [place] [address]                            [phone]  ------------------------------------------------------------------- Transthoracic Echocardiography  (Report amended )  Patient:    Desiree [name], Desiree [name] MR #:       [id number] Study Date: 08/26/2018 Gender:     F Age:        44 Height:     156.2 cm Weight:     83.3 kg BSA:        1.94 m^2 Pt. Status: Room:   ATTENDING    [name], MD  ORDERING     [name], MD  REFERRING    [name], MD  PERFORMING   [facility]  SONOGRAPHER  Alvino Chapel, RCS  cc:  ------------------------------------------------------------------- LV EF: 65% -   70%  ------------------------------------------------------------------- Indications:      Murmur 785.2.  ------------------------------------------------------------------- History:   PMH:  Acquired from the patient  and from the patient&'s chart.  PMH:  GERD. History of CVA (cerebrovascular accident) Obesity. NEOPLASM, MALIGNANT, BREAST, RIGHT.  Risk factors: Hypertension. Dyslipidemia.  ------------------------------------------------------------------- Study Conclusions  - Left ventricle: Turbulent flow through LVOT No obstruction. The   cavity size was normal. Wall thickness was increased in a pattern   of mild LVH. Systolic function was vigorous. The estimated   ejection fraction was in the range of 65% to 70%. - Aortic valve: Valve area (VTI): 2.02 cm^2. Valve area (Vmax):    2.08 cm^2. Valve area (Vmean): 2.17 cm^2.  ------------------------------------------------------------------- Study data:  Comparison was made to the study of 06/29/2013.  Study status:  Routine.  Procedure:  The patient reported no pain pre or post test. Transthoracic echocardiography. Image quality was adequate.  Study completion:  There were no complications. Transthoracic echocardiography.  M-mode, complete 2D, spectral Doppler, and color Doppler.  Birthdate:  Patient birthdate: [birth year].  Age:  Patient is 74 yr old.  Sex:  Gender: female. BMI: 34.1 kg/m^2.  Blood pressure:     132/78  Patient status: Inpatient.  Study date:  Study date: 08/26/2018. Study time: 12:58 PM.  Location:  Echo laboratory.  -------------------------------------------------------------------  ------------------------------------------------------------------- Left ventricle:  Turbulent flow through LVOT No obstruction. The cavity size was normal. Wall thickness was increased in a pattern of mild LVH. Systolic function was vigorous. The estimated ejection fraction was in the range of 65% to 70%.  ------------------------------------------------------------------- Aortic valve:   Mildly thickened leaflets. Sclerosis without stenosis.  Doppler:  There was no significant regurgitation.    VTI ratio of LVOT to aortic valve: 0.8. Valve area (VTI): 2.02 cm^2. Indexed valve area (VTI): 1.04 cm^2/m^2. Peak velocity ratio of LVOT to aortic valve: 0.82. Valve area (Vmax): 2.08 cm^2. Indexed valve area (Vmax): 1.07 cm^2/m^2. Mean velocity ratio of LVOT to aortic valve: 0.85. Valve area (Vmean): 2.17 cm^2. Indexed valve area (Vmean): 1.12 cm^2/m^2.    Mean gradient (S): 9 mm Hg. Peak gradient (S): 17 mm Hg.  ------------------------------------------------------------------- Mitral valve:   Structurally normal valve.   Leaflet separation was normal.  Doppler:  Transvalvular velocity was within the  normal range. There was no evidence for stenosis. There was no regurgitation.    Peak gradient (D): 3 mm Hg.  ------------------------------------------------------------------- Left atrium:  The atrium was normal in size.  ------------------------------------------------------------------- Right ventricle:  The cavity size was normal. Wall thickness was normal. Systolic function was normal.  ------------------------------------------------------------------- Pulmonic valve:    Structurally normal valve.   Cusp separation was normal.  Doppler:  Transvalvular velocity was within the normal range. There was trivial regurgitation.  ------------------------------------------------------------------- Tricuspid valve:   Structurally normal valve.   Leaflet separation was normal.  Doppler:  Transvalvular velocity was within the normal range. There was mild regurgitation.  ------------------------------------------------------------------- Right atrium:  The atrium was normal in size.  ------------------------------------------------------------------- Pericardium:  There was no pericardial effusion.  ------------------------------------------------------------------- Systemic veins: Inferior vena cava: The vessel was normal in size. The respirophasic diameter changes were in the normal range (= 50%), consistent with normal central venous pressure.  ------------------------------------------------------------------- Measurements   Left ventricle                           Value          Reference  LV ID, ED, PLAX chordal          (L)     39.1  mm       43 -  52  LV ID, ES, PLAX chordal                  24.6  mm       23 - 38  LV fx shortening, PLAX chordal           37    %        >=29  LV PW thickness, ED                      10.8  mm       ----------  IVS/LV PW ratio, ED                      1.17           <=1.3  Stroke volume, 2D                        94    ml        ----------  Stroke volume/bsa, 2D                    48    ml/m^2   ----------  LV ejection fraction, 1-p A4C            70    %        ----------  LV end-diastolic volume, 2-p             75    ml       ----------  LV end-systolic volume, 2-p              23    ml       ----------  LV ejection fraction, 2-p                70    %        ----------  Stroke volume, 2-p                       52    ml       ----------  LV end-diastolic volume/bsa, 2-p         39    ml/m^2   ----------  LV end-systolic volume/bsa, 2-p          12    ml/m^2   ----------  Stroke volume/bsa, 2-p                   26.9  ml/m^2   ----------  LV e&', lateral                           10.6  cm/s     ----------  LV E/e&', lateral                         7.57           ----------  LV e&', medial                            7.94  cm/s     ----------  LV E/e&', medial                          10.1           ----------  LV e&', average                           9.27  cm/s     ----------  LV E/e&', average                         8.65           ----------    Ventricular septum                       Value          Reference  IVS thickness, ED                        12.6  mm       ----------    LVOT                                     Value          Reference  LVOT ID, S                               18    mm       ----------  LVOT ID, A-P                             17.02 mm       ----------  LVOT area                                2.54  cm^2     ----------  LVOT peak velocity, S                    171   cm/s     ----------  LVOT mean velocity, S                    117   cm/s     ----------  LVOT VTI, S                              37.2  cm       ----------  LVOT peak gradient, S                    12    mm Hg    ----------    Aortic valve                             Value          Reference  Aortic valve peak velocity, S            209   cm/s     ----------  Aortic valve mean velocity, S            137   cm/s      ----------  Aortic valve VTI, S                      46.7  cm       ----------  Aortic mean gradient, S                  9     mm Hg    ----------  Aortic peak gradient, S                  17    mm Hg    ----------  VTI ratio, LVOT/AV                       0.8            ----------  Aortic valve area, VTI                   2.02  cm^2     ----------  Aortic valve area/bsa, VTI               1.04  cm^2/m^2 ----------  Velocity ratio, peak, LVOT/AV            0.82           ----------  Aortic valve area, peak velocity         2.08  cm^2     ----------  Aortic valve area/bsa, peak              1.07  cm^2/m^2 ----------  velocity  Velocity ratio, mean, LVOT/AV            0.85           ----------  Aortic valve area, mean velocity         2.17  cm^2     ----------  Aortic valve area/bsa, mean              1.12  cm^2/m^2 ----------  velocity    Aorta                                    Value          Reference  Aortic root ID, ED                       34    mm       ----------    Left atrium                              Value          Reference  LA ID, A-P, ES                           37    mm       ----------  LA ID/bsa, A-P                           1.91  cm/m^2   <=2.2  LA volume, S                             53.2  ml       ----------  LA volume/bsa, S                         27.4  ml/m^2   ----------  LA volume, ES, 1-p A4C                   58.3  ml       ----------  LA volume/bsa, ES, 1-p A4C               30.1  ml/m^2   ----------  LA volume, ES, 1-p A2C                   47.7  ml       ----------  LA volume/bsa, ES, 1-p A2C               24.6  ml/m^2   ----------    Mitral valve                             Value          Reference  Mitral E-wave peak velocity              80.2  cm/s     ----------  Mitral A-wave peak velocity              109   cm/s     ----------  Mitral deceleration time         (H)     342   ms       150 - 230  Mitral peak gradient, D                  3     mm  Hg    ----------  Mitral E/A ratio, peak                   0.7            ----------    Pulmonary arteries                       Value          Reference  PA pressure, S, DP                       23    mm Hg    <=30    Tricuspid valve                          Value          Reference  Tricuspid regurg peak velocity           223   cm/s     ----------  Tricuspid peak RV-RA gradient            20    mm Hg    ----------    Right atrium                             Value          Reference  RA ID, S-I, ES, A4C              (H)     51.3  mm       34 - 49  RA area, ES, A4C                         14.2  cm^2     8.3 -  19.5  RA volume, ES, A/L                       32.5  ml       ----------  RA volume/bsa, ES, A/L                   16.8  ml/m^2   ----------    Systemic veins                           Value          Reference  Estimated CVP                            3     mm Hg    ----------    Right ventricle                          Value          Reference  RV ID, ED, PLAX                          26.9  mm       19 - 38  TAPSE                                    22.8  mm       ----------  RV pressure, S, DP                       23    mm Hg    <=30  RV s&', lateral, S                        11.9  cm/s     ----------  Legend: (L)  and  (H)  mark values outside specified reference range.  ------------------------------------------------------------------- Vilinda Blanks, M.D. 2020-01-02T17:5   06/29/2013 Transthoracic Echocardiography  Patient:  Tyerra, Loretto MR #:    [id number] Study Date: 06/29/2013 Gender:   F Age:    33 Height:   154.9cm Weight:   84.8kg BSA:    1.70m^2 Pt. Status: Room:  ATTENDING  Jimmy Picket SONOGRAPHER Marygrace Drought, RCS PERFORMING  [facility] cc:  ------------------------------------------------------------ LV EF: 55% -   65%  ------------------------------------------------------------ Indications:   424.1 Aortic valve disorders.  ------------------------------------------------------------ History:  PMH: Hyperlipidemia Chest pain. Dyspnea. Risk factors: Hypertension.  ------------------------------------------------------------ Study Conclusions  - Left ventricle: The cavity size was normal. Wall thickness was normal. Systolic function was normal. The estimated ejection fraction was in the range of 55% to 65%. Wall motion was normal; there were no regional wall motion abnormalities. Left ventricular diastolic function parameters were normal. - Aortic valve: Trileaflet; mildly thickened leaflets. Mild regurgitation. - Mitral valve: Systolic bowing without prolapse. Mild regurgitation. Valve area by pressure half-time: 1.9cm^2. - Atrial septum: No defect or patent foramen ovale was identified. - Tricuspid valve: Mild regurgitation. Transthoracic echocardiography. M-mode, complete 2D, spectral Doppler, and color Doppler. Height: Height: 154.9cm. Height: 61in. Weight: Weight: 84.8kg. Weight: 186.6lb. Body mass index: BMI: 35.3kg/m^2. Body surface area:  BSA: 1.71m^2. Blood pressure:   134/80. Patient  status: Outpatient. Location: Echo laboratory.  ------------------------------------------------------------  ------------------------------------------------------------ Left ventricle: The cavity size was normal. Wall thickness was normal. Systolic function was normal. The estimated ejection fraction was in the range of 55% to 65%. Wall motion was normal; there were no regional wall motion abnormalities. The transmitral flow pattern was normal. The deceleration time of the early transmitral flow velocity was normal. The pulmonary vein flow pattern was normal. The tissue Doppler parameters were normal. Left ventricular diastolic function parameters were  normal.  ------------------------------------------------------------ Aortic valve:  Trileaflet; mildly thickened leaflets. Doppler:  There was no stenosis.  Mild regurgitation. VTI ratio of LVOT to aortic valve: 0.54. Peak velocity ratio of LVOT to aortic valve: 0.53.  Mean gradient: 78mm Hg (S). Peak gradient: 29mm Hg (S).  ------------------------------------------------------------ Mitral valve:  Mildly thickened leaflets . Systolic bowing without prolapse. Systolic bowing without prolapse. Doppler:  Mild regurgitation.  Valve area by pressure half-time: 1.9cm^2. Indexed valve area by pressure half-time: 1.03cm^2/m^2.  Mean gradient: 24mm Hg (D). Peak gradient: 40mm Hg (D).  ------------------------------------------------------------ Left atrium: LA volume/ BSA = 25.52ml/m2 The atrium was normal in size.  ------------------------------------------------------------ Atrial septum: No defect or patent foramen ovale was identified.  ------------------------------------------------------------ Right ventricle: The cavity size was normal. Systolic function was normal. Systolic pressure was within the normal range.  ------------------------------------------------------------ Pulmonic valve:  Doppler:  Trivial regurgitation.  ------------------------------------------------------------ Tricuspid valve:  Doppler:  Mild regurgitation.  ------------------------------------------------------------ Pulmonary artery:  The main pulmonary artery was normal-sized.  ------------------------------------------------------------ Right atrium: The atrium was normal in size.  ------------------------------------------------------------ Pericardium: There was no pericardial effusion.  ------------------------------------------------------------ Systemic veins: Inferior vena cava: The vessel was normal in size; the respirophasic diameter changes were in the normal  range (= 50%); findings are consistent with normal central venous pressure. Diameter: 49mm.  ------------------------------------------------------------  2D measurements    Normal Doppler measurements  Normal IVC              Main pulmonary Diam     11 mm   ------ artery Left ventricle         Pressure,  25 mm Hg =30 LVID ED,  43.8 mm   43-52  S chord,             Left ventricle PLAX              Ea, lat   11 cm/s  ------ LVID ES,  29.8 mm   23-38  ann, tiss chord,             DP PLAX              E/Ea, lat 7.58    ------ FS, chord,  32 %   >29   ann, tiss PLAX              DP LVPW, ED  9.89 mm   ------ Ea, med   7.5 cm/s  ------ IVS/LVPW   1    <1.3  ann, tiss ratio, ED           DP Ventricular septum       E/Ea, med 11.1    ------ IVS, ED  9.89 mm   ------ ann, tiss   2 Aorta             DP Root diam,  42 mm   ------ LVOT ED               Peak vel,  108 cm/s  ------ Left atrium  S AP dim    39 mm   ------ VTI, S   27.5 cm   ------ AP dim   2.12 cm/m^2 <2.2  Aortic valve index             Peak vel,  204 cm/s  ------ Right ventricle        S RVID ED,  28.7 mm   19-38  Mean vel,  138 cm/s  ------ PLAX              S                VTI, S   51.1 cm   ------                Mean     9 mm Hg ------                gradient,                S                Peak     17 mm Hg ------                gradient,                S                VTI ratio 0.54    ------                LVOT/AV                Peak vel  0.53    ------                 ratio,                LVOT/AV                Regurg PHT 816 ms   ------                Mitral valve                Peak E vel 83.4 cm/s  ------                Peak A vel 82.4 cm/s  ------                Mean vel, 54.6 cm/s  ------                D                Decelerati 190 ms   150-23                on time        0                Pressure  116 ms   ------                half-time                Mean     1 mm Hg ------                gradient,                D                Peak  4 mm Hg ------                gradient,                D                Peak E/A   1    ------                ratio                Area (PHT) 1.9 cm^2  ------                Area index 1.03 cm^2/m ------                (PHT)      ^2                Annulus  40.4 cm   ------                VTI                Tricuspid valve                Regurg   221 cm/s  ------                peak vel                Peak RV-RA  20 mm Hg ------                gradient,                S                Systemic veins                Estimated   5 mm Hg ------                CVP                Right ventricle                Pressure,  25 mm Hg <30                S                Sa vel,  11.8 cm/s  ------                lat  ann,                tiss DP  ------------------------------------------------------------ Prepared and Electronically Authenticated by  Shelva Majestic   [id number]:[id number]   Recent Radiology Findings: US Renal  Result Date: 08/06/2020 CLINICAL DATA:  LEFT flank pain for 2 weeks, hypertension, UTI EXAM: RENAL / URINARY TRACT ULTRASOUND COMPLETE COMPARISON:  03/14/2014 FINDINGS: Right Kidney: Renal measurements: 10.1 x 4.3 x 4.6 cm = volume: 104 mL. Normal cortical thickness with upper normal echogenicity. Minimally prominent renal pelvis. No mass, shadowing calcification or definite calyceal dilatation. Left Kidney: Renal measurements: 10.1 x 5.6 x 5.6 cm = volume: 167 mL. Normal cortical thickness. Slightly increased cortical echogenicity. Small LEFT renal cyst 13 x 11 x 15 mm with enhanced through transmission. No hydronephrosis or shadowing calcification. No additional masses. Bladder: Appears normal for degree of bladder distention. LEFT ureteral jet was visualized during the exam. Other: N/A IMPRESSION: Suspected medical renal disease changes of  the kidneys. Small LEFT renal cyst 15 mm diameter. Minimal prominence of the RIGHT renal pelvis without caliectasis. Electronically Signed   By: Lavonia Dana M.D.   On: 08/06/2020 18:50   CT ANGIO CHEST AORTA W/CM & OR WO/CM  Result Date: 08/09/2020 CLINICAL DATA:  Thoracic aortic aneurysm. EXAM: CT ANGIOGRAPHY CHEST WITH CONTRAST TECHNIQUE: Multidetector CT imaging of the chest was performed using the standard protocol during bolus administration of intravenous contrast. Multiplanar CT image reconstructions and MIPs were obtained to evaluate the vascular anatomy. CONTRAST:  67mL ISOVUE-370 IOPAMIDOL (ISOVUE-370) INJECTION 76% COMPARISON:  08/11/2019 FINDINGS: Cardiovascular: Heart size upper normal. No pericardial effusion. Maximum obtainable diameter of the ascending thoracic aorta on today's study is 4.1 cm on image 54/5, stable  in the interval. Minimal atherosclerotic calcification ascending aorta. Mediastinum/Nodes: No mediastinal lymphadenopathy. There is no hilar lymphadenopathy. The esophagus has normal imaging features. There is no axillary lymphadenopathy. Lungs/Pleura: Calcified granuloma noted left apex. Scattered areas of atelectasis noted in both lungs. No focal airspace consolidation. No pleural effusion. No suspicious pulmonary nodule or mass. Upper Abdomen: 19 mm subcapsular low-density lesion posterior hepatic dome is stable in the interval consistent with benign etiology. Musculoskeletal: No worrisome lytic or sclerotic osseous abnormality. Review of the MIP images confirms the above findings. IMPRESSION: 1. Stable 4.1 cm ascending thoracic aortic diameter. Recommend annual imaging followup by CTA or MRA. This recommendation follows 2010 ACCF/AHA/AATS/ACR/ASA/SCA/SCAI/SIR/STS/SVM Guidelines for the Diagnosis and Management of Patients with Thoracic Aortic Disease. Circulation. 2010; 121: A128-N867. Aortic aneurysm NOS (ICD10-I71.9) . 2. Otherwise stable exam without acute findings. Electronically Signed   By: Misty Stanley M.D.   On: 08/09/2020 11:21   Ct Angio Chest Aorta W/cm &/or Wo/cm  Result Date: 08/19/2018 CLINICAL DATA:  Followup thoracic aortic aneurysm. EXAM: CT ANGIOGRAPHY CHEST WITH CONTRAST TECHNIQUE: Multidetector CT imaging of the chest was performed using the standard protocol during bolus administration of intravenous contrast. Multiplanar CT image reconstructions and MIPs were obtained to evaluate the vascular anatomy. CONTRAST:  34mL ISOVUE-370 IOPAMIDOL (ISOVUE-370) INJECTION 76% Creatinine was obtained on site at [place] at [address] Results: Creatinine 1.1 mg/dL. COMPARISON:  08/20/2017 FINDINGS: Cardiovascular: Heart size appears within normal limits. No pericardial effusion identified. The ascending thoracic aorta measures 4.2 cm and maximum diameter, image 56/5. This is  unchanged from the previous exam. The transverse aortic arch measures up to 2.8 cm. At the level of the diaphragmatic hiatus the descending thoracic aorta measures 2.0 cm. No evidence for aortic dissection. Mediastinum/Nodes: Normal appearance of the thyroid gland. The trachea appears patent and is midline. Normal appearance of the esophagus. No enlarged supraclavicular, axillary, mediastinal, or hilar adenopathy. Lungs/Pleura: No pleural effusion. Similar appearance of ground-glass opacities within both lungs with a lower lobe predominance. Calcified granuloma identified within the left upper lobe. Small pulmonary nodule within the posterior aspect of the right midlung measures 3 mm, image 40/7. Unchanged from previous exam. Upper Abdomen: No acute abnormality noted within the upper abdomen. Musculoskeletal: Spondylosis noted within the thoracic spine. Review of the MIP images confirms the above findings. IMPRESSION: 1. Stable ascending thoracic aortic aneurysm measuring 4.2 cm maximum diameter. Recommend annual imaging followup by CTA or MRA. This recommendation follows 2010 ACCF/AHA/AATS/ACR/ASA/SCA/SCAI/SIR/STS/SVM Guidelines for the Diagnosis and Management of Patients with Thoracic Aortic Disease. Circulation. 2010; 121: e266-e369 2. 3 mm right lung nodule is unchanged from previous exam compatible with a benign abnormality. Electronically Signed   By: Kerby Moors M.D.   On: 08/19/2018 12:03  I have independently reviewed the above radiology studies  and reviewed the findings with the patient.   Ct Angio Chest Aorta W &/or Wo Contrast  Result Date: 07/24/2016 CLINICAL DATA:  Thoracic aortic aneurysm fall. Nonsmoker. History of right breast cancer with partial mastectomy. No chest complaints. EXAM: CT ANGIOGRAPHY CHEST WITH CONTRAST TECHNIQUE: Multidetector CT imaging of the chest was performed using the standard protocol during bolus administration of intravenous contrast. Multiplanar CT image  reconstructions and MIPs were obtained to evaluate the vascular anatomy. CONTRAST:  75 cc of Isovue 370 IV COMPARISON:  06/29/2014 CT FINDINGS: Cardiovascular: There has been no significant interval change in the appearance of the fusiform ascending thoracic aortic aneurysm. Greatest ascending aortic dimension is 4.3 cm and along the arch, 3.3 cm. No descending thoracic aortic aneurysm. There is no aortic dissection, wall thickening or evidence of leak. Normal heart size. No pericardial effusion. Great vessels are unremarkable with normal takeoff anatomy. Mediastinum/Nodes: No enlarged mediastinal, hilar, or axillary lymph nodes. Thyroid gland, trachea, and esophagus demonstrate no significant findings. Lungs/Pleura: Bibasilar dependent atelectasis. No pneumothorax or effusion. Minimal scarring in the right middle lobe versus atelectasis. No suspicious nodules or pulmonary masses. Upper Abdomen: Stable incidental hepatic cysts. Postsurgical changes right breast. 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 No suspicious osseous lesions. Review of the MIP images confirms the above findings. IMPRESSION: Stable fusiform ascending aortic aneurysm measuring up to 4.3 cm maximum, stable since 2015. No acute pulmonary abnormality. Electronically Signed   By: Ashley Royalty M.D.   On: 07/24/2016 14:51       06/06/2013   CLINICAL DATA:  Neck pain with right-sided radicular symptoms  EXAM: CERVICAL SPINE  4+ VIEWS  COMPARISON:  None.  FINDINGS: Frontal, lateral, open-mouth odontoid, and bilateral oblique views were obtained. There is no fracture or spondylolisthesis. Prevertebral soft tissues and predental space regions are normal.  There is fairly marked disc space narrowing at C4-5. There is moderate disc space narrowing at C7-T1. There is mild disc space narrowing at C3-4. There is the exit foraminal narrowing at C4-5 due to the facet hypertrophy.  There is a small cervical rib on the right.   IMPRESSION: Multilevel osteoarthritic change. No fracture or spondylolisthesis. Small cervical rib on the right.   Electronically Signed   By: Lowella Grip M.D.   On: 06/06/2013 21:19   Dg Thoracic Spine W/swimmers  06/06/2013   CLINICAL DATA:  Pain with radicular symptoms  EXAM: THORACIC SPINE - 2 VIEW + SWIMMERS  COMPARISON:  None.  FINDINGS: Frontal, lateral, and swimmer's views were obtained. There is no fracture or spondylolisthesis. There the is multilevel disc space narrowing with several small osteophytes consistent with osteoarthritic change. No erosive change.  IMPRESSION: Multilevel osteoarthritic change. No fracture or spondylolisthesis.   Electronically Signed   By: Lowella Grip M.D.   On: 06/06/2013 21:20   Ct Angio Chest Aorta W/cm &/or Wo/cm  06/09/2013   CLINICAL DATA:  Aneurysm  EXAM: CT ANGIOGRAPHY CHEST WITH CONTRAST  TECHNIQUE: Multidetector CT imaging of the chest was performed using the standard protocol during bolus administration of intravenous contrast. Multiplanar CT image reconstructions including MIPs were obtained to evaluate the vascular anatomy.  CONTRAST:  158mL OMNIPAQUE IOHEXOL 350 MG/ML SOLN  COMPARISON:  09/02/2010  FINDINGS: Maximal aortic diameter at the sinus of Valsalva, sino-tubular junction, and ascending aorta are 3.4 cm, 2.9 cm, and 4.5 cm respectively. Previously, the maximal diameter in the ascending aorta was 4.2 cm. No evidence  of dissection.  Origins of the great vessels are patent. Right subclavian and common carotid arteries are patent. Visualized vertebral arteries are patent.  No obvious filling defect in the pulmonary arterial tree to suggest acute pulmonary thromboembolism.  No abnormal mediastinal adenopathy or pericardial effusion.  Scattered linear atelectasis in the lungs without consolidation or mass.  Degenerative disc disease within the thoracic spine. No compression deformity. Stable liver cyst.  Review of the MIP images confirms the  above findings.  IMPRESSION: Ascending aortic aneurysm has increased from 4.2 cm to 4.5 cm in maximal diameter.   Electronically Signed   By: Maryclare Bean M.D.   On: 06/09/2013 07:39    ECHO: 03/30/2008: SUMMARY - Overall left ventricular systolic function was normal. There were no left ventricular regional wall motion abnormalities. There was moderate asymmetric septal hypertrophy. - The AV leaflets themselves appear normal with normal opening. However, there are increased velocities and gradients. I do not appreciate a subvalvular membrane and there does not appear to be LVOT obstruction from ASH. Consider TEE or cardiac CT if clinically indicated for enhanced visualization. The mean transaortic valve gradient was 11 mmHg. Estimated aortic valve area (by VTI) was 1.74 cm^2. Estimated aortic valve area (by Vmax) was 1.72 cm^2. - There appears to be a linear density in the prox asc aorta without definitive color flow appreciated on doppler in this area. This is not seen in all views. Additionally, the prox asc aorta is mild to mod dilated in this region. Recommend TEE or cardiac/chest CT to eval for poss aneurysm and/or dissection if clinically indicated. There was moderate aortic arch dilatation. - There was mild mitral valvular regurgitation. The effective orifice of mitral regurgitation by proximal isovelocity surface area was 0.21 cm^2. The volume of mitral regurgitation by proximal isovelocity surface area was 26 cc. - The estimated peak right ventricular systolic pressure was mildly increased.   Recent Lab Findings: Lab Results  Component Value Date   WBC 4.4 05/11/2020   HGB 11.2 (L) 05/11/2020   HCT 34.3 (L) 05/11/2020   PLT 331 05/11/2020   GLUCOSE 100 (H) 05/11/2020   CHOL 193 05/11/2020   TRIG 58 05/11/2020   HDL 85 05/11/2020   LDLCALC 94 05/11/2020   ALT 9 05/11/2020   AST 19 05/11/2020   NA 140 05/11/2020   K 4.1 05/11/2020   CL 102 05/11/2020   CREATININE 1.02  (H) 05/11/2020   BUN 22 05/11/2020   CO2 31 05/11/2020   TSH 3.36 10/18/2019   HGBA1C 5.6 05/27/2014   Chronic Kidney Disease   Stage I     GFR >90  Stage II    GFR 60-89  Stage IIIA GFR 45-59  Stage IIIB GFR 30-44  Stage IV   GFR 15-29  Stage V    GFR  <15  Lab Results  Component Value Date   CREATININE 1.02 (H) 05/11/2020   CrCl cannot be calculated (Patient's most recent lab result is older than the maximum 21 days allowed.).  Aortic Size Index=   4.3    /Body surface area is 1.92 meters squared. =2.33  < 2.75 cm/m2      4% risk per year 2.75 to 4.25          8% risk per year > 4.25 cm/m2    20% risk per year    Assessment / Plan:  #1 stable fusiform ascending aortic aneurysm measuring 4.3 cm stable since 2009, today measured at 4.1 cm. #2 trileaflet aortic  valve by echocardiogram in 2014 #3 history of hypertension   Plan CTA in  1 year for  follow-up  Avoiding strenuous lifting and avoiding quinolones has been discussed with the patient Keep blood pressure controlled  Desiree Isaac MD      [address] [place] [address] Office [phone]   Beeper [phone]

## 2020-08-09 NOTE — Patient Instructions (Signed)

## 2020-08-16 ENCOUNTER — Other Ambulatory Visit: Payer: Self-pay

## 2020-08-16 ENCOUNTER — Ambulatory Visit (INDEPENDENT_AMBULATORY_CARE_PROVIDER_SITE_OTHER): Payer: PPO | Admitting: Family Medicine

## 2020-08-16 ENCOUNTER — Encounter: Payer: Self-pay | Admitting: Family Medicine

## 2020-08-16 VITALS — BP 135/85 | HR 63 | Resp 16 | Ht 62.0 in | Wt 187.0 lb

## 2020-08-16 DIAGNOSIS — R109 Unspecified abdominal pain: Secondary | ICD-10-CM | POA: Diagnosis not present

## 2020-08-16 DIAGNOSIS — I10 Essential (primary) hypertension: Secondary | ICD-10-CM | POA: Diagnosis not present

## 2020-08-16 DIAGNOSIS — E8881 Metabolic syndrome: Secondary | ICD-10-CM | POA: Diagnosis not present

## 2020-08-16 DIAGNOSIS — E785 Hyperlipidemia, unspecified: Secondary | ICD-10-CM | POA: Diagnosis not present

## 2020-08-16 DIAGNOSIS — E669 Obesity, unspecified: Secondary | ICD-10-CM

## 2020-08-16 DIAGNOSIS — E559 Vitamin D deficiency, unspecified: Secondary | ICD-10-CM

## 2020-08-16 DIAGNOSIS — N3000 Acute cystitis without hematuria: Secondary | ICD-10-CM

## 2020-08-16 DIAGNOSIS — Z780 Asymptomatic menopausal state: Secondary | ICD-10-CM

## 2020-08-16 MED ORDER — IBUPROFEN 800 MG PO TABS
ORAL_TABLET | ORAL | 0 refills | Status: DC
Start: 2020-08-16 — End: 2021-09-10

## 2020-08-16 NOTE — Patient Instructions (Addendum)
Keep appointment in February, call if you need me sooner  Please schedule dexa at checkout  Fasting lipid, cmp and eGFr, TSH, vit D 1 week before appointment  Thankful test result is good and you are better  Best for 2022!

## 2020-08-19 ENCOUNTER — Encounter: Payer: Self-pay | Admitting: Family Medicine

## 2020-08-19 NOTE — Assessment & Plan Note (Signed)
resolved 

## 2020-08-19 NOTE — Assessment & Plan Note (Signed)
  Patient re-educated about  the importance of commitment to a  minimum of 150 minutes of exercise per week as able.  The importance of healthy food choices with portion control discussed, as well as eating regularly and within a 12 hour window most days. The need to choose "clean , green" food 50 to 75% of the time is discussed, as well as to make water the primary drink and set a goal of 64 ounces water daily.    Weight /BMI 08/16/2020 08/09/2020 07/31/2020  WEIGHT 187 lb 188 lb 186 lb  HEIGHT 5\' 2"  5' 1.5" 5' 1.5"  BMI 34.2 kg/m2 34.95 kg/m2 34.58 kg/m2

## 2020-08-19 NOTE — Assessment & Plan Note (Addendum)
Hyperlipidemia:Low fat diet discussed and encouraged.   Lipid Panel  Lab Results  Component Value Date   CHOL 193 05/11/2020   HDL 85 05/11/2020   LDLCALC 94 05/11/2020   TRIG 58 05/11/2020   CHOLHDL 2.3 05/11/2020   Controlled, no change in medication

## 2020-08-19 NOTE — Assessment & Plan Note (Signed)
Controlled, no change in medication DASH diet and commitment to daily physical activity for a minimum of 30 minutes discussed and encouraged, as a part of hypertension management. The importance of attaining a healthy weight is also discussed.  BP/Weight 08/16/2020 08/09/2020 07/31/2020 07/27/2020 06/19/2020 03/15/2020 6/75/9163  Systolic BP 846 659 935 701 779 - 390  Diastolic BP 85 88 82 79 80 - 80  Wt. (Lbs) 187 188 186 - 185 185 187  BMI 34.2 34.95 34.58 - 33.84 33.84 34.2

## 2020-08-19 NOTE — Progress Notes (Signed)
   Desiree [name]     MRN: [id number]      DOB: 05/07/1946   HPI Desiree [name] is here for follow up and re-evaluation of chronic medical conditions, medication management and review of any available recent lab and radiology data.  Preventive health is updated, specifically  Cancer screening and Immunization.   Questions or concerns regarding consultations or procedures which the PT has had in the interim are  addressed. The PT denies any adverse reactions to current medications since the last visit.  There are no new concerns.  There are no specific complaints   ROS Denies recent fever or chills. Denies sinus pressure, nasal congestion, ear pain or sore throat. Denies chest congestion, productive cough or wheezing. Denies chest pains, palpitations and leg swelling Denies abdominal pain, nausea, vomiting,diarrhea or constipation.   Denies dysuria, frequency, hesitancy or incontinence. Denies joint pain, swelling and limitation in mobility. Denies headaches, seizures, numbness, or tingling. Denies depression, anxiety or insomnia. Denies skin break down or rash.   PE  BP 135/85   Pulse 63   Resp 16   Ht 5\' 2"  (1.575 m)   Wt 187 lb (84.8 kg)   SpO2 94%   BMI 34.20 kg/m   Patient alert and oriented and in no cardiopulmonary distress.  HEENT: No facial asymmetry, EOMI,     Neck supple .  Chest: Clear to auscultation bilaterally.  CVS: S1, S2 no murmurs, no S3.Regular rate.  ABD: Soft non tender.   Ext: No edema  MS: Adequate ROM spine, shoulders, hips and knees.  Skin: Intact, no ulcerations or rash noted.  Psych: Good eye contact, normal affect. Memory intact not anxious or depressed appearing.  CNS: CN 2-12 intact, power,  normal throughout.no focal deficits noted.   Assessment & Plan  Essential hypertension Controlled, no change in medication DASH diet and commitment to daily physical activity for a minimum of 30 minutes discussed and encouraged, as a part of  hypertension management. The importance of attaining a healthy weight is also discussed.  BP/Weight 08/16/2020 08/09/2020 07/31/2020 07/27/2020 06/19/2020 03/15/2020 2/99/3716  Systolic BP 967 893 810 175 102 - 585  Diastolic BP 85 88 82 79 80 - 80  Wt. (Lbs) 187 188 186 - 185 185 187  BMI 34.2 34.95 34.58 - 33.84 33.84 34.2       Hyperlipidemia LDL goal <100 Hyperlipidemia:Low fat diet discussed and encouraged.   Lipid Panel  Lab Results  Component Value Date   CHOL 193 05/11/2020   HDL 85 05/11/2020   LDLCALC 94 05/11/2020   TRIG 58 05/11/2020   CHOLHDL 2.3 05/11/2020   Controlled, no change in medication     Acute cystitis without hematuria resolved  Acute left flank pain resolved  Obesity (BMI 30-39.9)  Patient re-educated about  the importance of commitment to a  minimum of 150 minutes of exercise per week as able.  The importance of healthy food choices with portion control discussed, as well as eating regularly and within a 12 hour window most days. The need to choose "clean , green" food 50 to 75% of the time is discussed, as well as to make water the primary drink and set a goal of 64 ounces water daily.    Weight /BMI 08/16/2020 08/09/2020 07/31/2020  WEIGHT 187 lb 188 lb 186 lb  HEIGHT 5\' 2"  5' 1.5" 5' 1.5"  BMI 34.2 kg/m2 34.95 kg/m2 34.58 kg/m2

## 2020-08-22 ENCOUNTER — Other Ambulatory Visit (HOSPITAL_COMMUNITY): Payer: PPO

## 2020-08-27 ENCOUNTER — Other Ambulatory Visit (HOSPITAL_COMMUNITY): Payer: PPO

## 2020-08-31 ENCOUNTER — Other Ambulatory Visit: Payer: Self-pay

## 2020-08-31 ENCOUNTER — Other Ambulatory Visit: Payer: PPO

## 2020-08-31 DIAGNOSIS — Z20822 Contact with and (suspected) exposure to covid-19: Secondary | ICD-10-CM | POA: Diagnosis not present

## 2020-09-01 ENCOUNTER — Other Ambulatory Visit: Payer: PPO

## 2020-09-04 ENCOUNTER — Other Ambulatory Visit: Payer: Self-pay | Admitting: Family Medicine

## 2020-09-04 LAB — NOVEL CORONAVIRUS, NAA: SARS-CoV-2, NAA: NOT DETECTED

## 2020-09-06 ENCOUNTER — Other Ambulatory Visit: Payer: PPO

## 2020-09-13 ENCOUNTER — Encounter: Payer: Self-pay | Admitting: Orthopaedic Surgery

## 2020-09-13 ENCOUNTER — Other Ambulatory Visit: Payer: Self-pay

## 2020-09-13 ENCOUNTER — Ambulatory Visit (INDEPENDENT_AMBULATORY_CARE_PROVIDER_SITE_OTHER): Payer: PPO | Admitting: Orthopaedic Surgery

## 2020-09-13 ENCOUNTER — Other Ambulatory Visit: Payer: Self-pay | Admitting: Family Medicine

## 2020-09-13 VITALS — Ht 62.0 in | Wt 187.0 lb

## 2020-09-13 DIAGNOSIS — E559 Vitamin D deficiency, unspecified: Secondary | ICD-10-CM | POA: Diagnosis not present

## 2020-09-13 DIAGNOSIS — Z96651 Presence of right artificial knee joint: Secondary | ICD-10-CM

## 2020-09-13 DIAGNOSIS — R809 Proteinuria, unspecified: Secondary | ICD-10-CM | POA: Diagnosis not present

## 2020-09-13 DIAGNOSIS — N1832 Chronic kidney disease, stage 3b: Secondary | ICD-10-CM | POA: Diagnosis not present

## 2020-09-13 DIAGNOSIS — M1712 Unilateral primary osteoarthritis, left knee: Secondary | ICD-10-CM | POA: Insufficient documentation

## 2020-09-13 DIAGNOSIS — Z79899 Other long term (current) drug therapy: Secondary | ICD-10-CM | POA: Diagnosis not present

## 2020-09-13 NOTE — Progress Notes (Signed)
Office Visit Note   Patient: Desiree [name]           Date of Birth: 11/24/1945           MRN: [id number] Visit Date: 09/13/2020              Requested by: [name], [address],  [place] [address] PCP: [name], MD   Assessment & Plan: Visit Diagnoses:  1. S/P TKR (total knee replacement), right   2. Unilateral primary osteoarthritis, left knee     Plan: Patient will call when she is ready to proceed with left total knee arthroplasty. She is very happy with the results of her right total knee.  Follow-Up Instructions: No follow-ups on file.   Orders:  No orders of the defined types were placed in this encounter.  No orders of the defined types were placed in this encounter.     Procedures: No procedures performed   Clinical Data: No additional findings.   Subjective: Chief Complaint  Patient presents with  . Right Knee - Follow-up    12/21/2019 Right TKA  . Left Knee - Pain    HPI 75 year old female who is 9 months post right total knee arthroplasty. She denies full extension excellent flexion good quad strength. She can step up and down on the step without problems. Left knee symptoms have progressed and she wants to schedule left total knee arthroplasty likely sometime late spring. She will call when she is ready since she has a daughter has not wedding coming up soon. She has used ibuprofen for left knee symptoms. Left knee x-rays showed lateral compartment joint space narrowing more than medial compartment narrowing. Patellofemoral degenerative changes with spurring subchondral sclerosis marginal osteophytes. Review of Systems updated unchanged since her surgery.   Objective: Vital Signs: Ht 5\' 2"  (1.575 m)   Wt 187 lb (84.8 kg)   BMI 34.20 kg/m   Physical Exam Constitutional:      Appearance: She is well-developed.  HENT:     Head: Normocephalic.     Right Ear: External ear normal.     Left Ear: External  ear normal.  Eyes:     Pupils: Pupils are equal, round, and reactive to light.  Neck:     Thyroid: No thyromegaly.     Trachea: No tracheal deviation.  Cardiovascular:     Rate and Rhythm: Normal rate.  Pulmonary:     Effort: Pulmonary effort is normal.  Abdominal:     Palpations: Abdomen is soft.  Skin:    General: Skin is warm and dry.  Neurological:     Mental Status: She is alert and oriented to person, place, and time.  Psychiatric:        Mood and Affect: Mood and affect normal.        Behavior: Behavior normal.     Ortho Exam normal pulses negative logroll the hips. No knee effusion right knee she reaches full extension good quad strength against resistance she can go up and down on the step of the right leg easily. She has stopped with the left knee due to patellofemoral pain. Collateral ligaments are stable right knee. Palpable osteophytes with medial lateral joint line tenderness left knee. Left knee comes to full extension flexes 110 degrees.  Specialty Comments:  No specialty comments available.  Imaging: No results found.   PMFS History: Patient Active Problem List   Diagnosis Date Noted  . Unilateral  primary osteoarthritis, left knee 09/13/2020  . Acute left flank pain 07/31/2020  . S/P TKR (total knee replacement), right 12/21/2019  . Constipation 11/10/2019  . Fatigue 10/18/2019  . Left shoulder pain 04/11/2019  . Ganglion cyst of volar aspect of left wrist 10/05/2018  . Knee pain, right 05/31/2017  . Neck pain on left side 01/01/2016  . Acute cystitis without hematuria 02/02/2015  . 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 Metabolic syndrome X 03/55/9741  . Osteopenia 12/12/2013  . Anemia 11/29/2013  . Obesity (BMI 30-39.9) 10/17/2012  . Thoracic aortic aneurysm ([place]) 10/12/2012  . Allergic rhinitis 03/02/2012  . BLADDER PROLAPSE 09/24/2010  . NEOPLASM, MALIGNANT, BREAST, RIGHT 02/04/2008  . Hyperlipidemia LDL goal <100 02/04/2008   . Essential hypertension 02/04/2008   Past Medical History:  Diagnosis Date  . Allergic rhinitis   . Arthritis HIPS/ WRISTS  . Cancer ([place])   . DDD (degenerative disc disease), cervical   . GERD (gastroesophageal reflux disease) OCCASIONAL  . Heart murmur    turbulent flow through LVOT, no obstruction 08/26/18 echo  . History of Bell's palsy 2011  RIGHT SIDE -- RESOLVED  . 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 History of CVA (cerebrovascular accident) PER SCAN IN 2011  . Hyperlipidemia   . Hypertension   . Impaired fasting glucose PER PCP  DR SIMPSON   WATCH DIET  . Mixed urge and stress incontinence   . Nocturia   . Sinus drainage   . Thoracic ascending aortic aneurysm ([place]) LAST CHEST CT 09-02-2010--  FOLLOWED BY  CARDIOLOGIST--  DR [id number]  . Vaginal wall prolapse     Family History  Problem Relation Age of Onset  . Kidney failure Mother   . Hypertension Mother   . Heart attack Mother 48  . Throat cancer Father   . Prostate cancer Father   . Lung cancer Father 47  . Breast cancer Sister   . Hypertension Brother        2 stents  . Colon cancer Neg Hx   . Colon polyps Neg Hx     Past Surgical History:  Procedure Laterality Date  . ABDOMINAL HYSTERECTOMY    . BILATERAL CARPAL TUNNEL RELEASE  1980'S  . CARDIAC CATHETERIZATION  02-01-2009  DR EICHHORN   NORMAL CORONARY ARTERIES/ NORMAL LV SIZE AND FUNCTION/ AORTIC ROOT SIZE AT THE UPPER LIMIT OF NORMAL   . CARPAL TUNNEL RELEASE    . CERVICAL DISC SURGERY  12-12-2005  DR Vertell Limber   LEFT  C6 - C7 HERINATED / DDD/ SPONDYLOSIS  . COLONOSCOPY N/A 02/10/2014   Procedure: COLONOSCOPY;  Surgeon: Danie Binder, MD;  [facility];  Service: Endoscopy;  Laterality: N/A;  8:30  . CYSTOCELE REPAIR  08/02/2012   Procedure: ANTERIOR REPAIR (CYSTOCELE);  Surgeon: Ailene Rud, MD;  Location: [facility];  Service: Urology;   Laterality: N/A;  [employer] Uphold Anterior Pelvic Floor Sacrospinus Repair. Anterior wall of the vagina.  . ESOPHAGOGASTRODUODENOSCOPY N/A 02/10/2014   Procedure: ESOPHAGOGASTRODUODENOSCOPY (EGD);  Surgeon: Danie Binder, MD;  [facility];  Service: Endoscopy;  Laterality: N/A;  . GIVENS CAPSULE STUDY N/A 03/13/2014   Procedure: GIVENS CAPSULE STUDY;  Surgeon: Danie Binder, MD;  [facility];  Service: Endoscopy;  Laterality: N/A;  7:30  . JOINT REPLACEMENT N/A    Phreesia 06/16/2020  . NECK SURGERY  2007  . RIGHT PARTIAL MASTECTOMY  11-11-2004  DR [name] YOUNG   DUCTAL CARCINOMA IN SITU RIGHT BREAST  . TOTAL ABDOMINAL HYSTERECTOMY W/ BILATERAL SALPINGOOPHORECTOMY  1990  . TOTAL KNEE ARTHROPLASTY Right 12/21/2019   Procedure: RIGHT  TOTAL KNEE ARTHROPLASTY;  Surgeon: [name], MD;  Location: [facility];  Service: Orthopedics;  Laterality: Right;  . TRANSTHORACIC ECHOCARDIOGRAM  03-30-2008  DR MARGARET SIMPSON   LV SIZE AND FUNCTION NORMAL/ MODERATE AORTIC ARCH DILATATION/ MILD MR   Social History   Occupational History  . Not on file  Tobacco Use  . Smoking status: Never Smoker  . Smokeless tobacco: Never Used  Vaping Use  . Vaping Use: Never used  Substance and Sexual Activity  . Alcohol use: No  . Drug use: No  . Sexual activity: Not Currently

## 2020-09-14 ENCOUNTER — Ambulatory Visit (HOSPITAL_COMMUNITY)
Admission: RE | Admit: 2020-09-14 | Discharge: 2020-09-14 | Disposition: A | Discharge status: Home / Self Care | Payer: PPO | Source: Ambulatory Visit | Attending: Family Medicine | Admitting: Family Medicine

## 2020-09-14 ENCOUNTER — Other Ambulatory Visit: Payer: Self-pay

## 2020-09-14 DIAGNOSIS — M85851 Other specified disorders of bone density and structure, right thigh: Secondary | ICD-10-CM | POA: Diagnosis not present

## 2020-09-14 DIAGNOSIS — R29890 Loss of height: Secondary | ICD-10-CM | POA: Diagnosis not present

## 2020-09-14 DIAGNOSIS — Z853 Personal history of malignant neoplasm of breast: Secondary | ICD-10-CM | POA: Diagnosis not present

## 2020-09-14 DIAGNOSIS — Z780 Asymptomatic menopausal state: Secondary | ICD-10-CM | POA: Diagnosis not present

## 2020-09-20 DIAGNOSIS — N281 Cyst of kidney, acquired: Secondary | ICD-10-CM | POA: Diagnosis not present

## 2020-09-20 DIAGNOSIS — N1831 Chronic kidney disease, stage 3a: Secondary | ICD-10-CM | POA: Diagnosis not present

## 2020-09-20 DIAGNOSIS — I129 Hypertensive chronic kidney disease with stage 1 through stage 4 chronic kidney disease, or unspecified chronic kidney disease: Secondary | ICD-10-CM | POA: Diagnosis not present

## 2020-09-20 DIAGNOSIS — D631 Anemia in chronic kidney disease: Secondary | ICD-10-CM | POA: Diagnosis not present

## 2020-09-20 DIAGNOSIS — E559 Vitamin D deficiency, unspecified: Secondary | ICD-10-CM | POA: Diagnosis not present

## 2020-09-20 DIAGNOSIS — N189 Chronic kidney disease, unspecified: Secondary | ICD-10-CM | POA: Diagnosis not present

## 2020-09-20 DIAGNOSIS — I712 Thoracic aortic aneurysm, without rupture: Secondary | ICD-10-CM | POA: Diagnosis not present

## 2020-09-28 ENCOUNTER — Other Ambulatory Visit: Payer: Self-pay | Admitting: Family Medicine

## 2020-10-11 ENCOUNTER — Ambulatory Visit (INDEPENDENT_AMBULATORY_CARE_PROVIDER_SITE_OTHER): Payer: PPO | Admitting: Internal Medicine

## 2020-10-11 ENCOUNTER — Other Ambulatory Visit: Payer: Self-pay

## 2020-10-11 ENCOUNTER — Encounter: Payer: Self-pay | Admitting: Internal Medicine

## 2020-10-11 VITALS — BP 134/81 | HR 72 | Temp 98.4°F | Resp 18 | Ht 61.5 in | Wt 190.0 lb

## 2020-10-11 DIAGNOSIS — N760 Acute vaginitis: Secondary | ICD-10-CM

## 2020-10-11 DIAGNOSIS — R300 Dysuria: Secondary | ICD-10-CM

## 2020-10-11 LAB — POCT URINALYSIS DIP (CLINITEK)
Bilirubin, UA: NEGATIVE
Glucose, UA: NEGATIVE mg/dL
Ketones, POC UA: NEGATIVE mg/dL
Leukocytes, UA: NEGATIVE
Nitrite, UA: NEGATIVE
POC PROTEIN,UA: NEGATIVE
Spec Grav, UA: <=1.005 — AB (ref 1.010–1.025)
Urobilinogen, UA: 0.2 E.U./dL
pH, UA: 5.5 (ref 5.0–8.0)

## 2020-10-11 MED ORDER — PHENAZOPYRIDINE HCL 100 MG PO TABS: 100.0000 mg | ORAL_TABLET | Freq: Three times a day (TID) | ORAL | 0 refills | Status: DC | PRN

## 2020-10-11 MED ORDER — FLUCONAZOLE 150 MG PO TABS: 150.0000 mg | ORAL_TABLET | Freq: Once | ORAL | 0 refills | Status: AC

## 2020-10-11 NOTE — Patient Instructions (Signed)
Please take Fluconazole for fungal infection. Please take Phenazopyridine as prescribed for burning urination.  Please continue to keep the perineal area clean and dry.  Please continue to take at least 60 ounces of fluid in a day to avoid dehydration.

## 2020-10-11 NOTE — Progress Notes (Signed)
Acute Office Visit  Subjective:    Patient ID: Desiree [name], female    DOB: 02/23/1946, 75 y.o.   MRN: [id number]  Chief Complaint  Patient presents with  . Urinary Tract Infection    Possible UTI has been burning itching and frequent urination since last week     HPI Patient is in today for evaluation of dysuria and urinary frequency for last 1 week. Denies any hematuria. Denies any fever, chills, nausea, vomiting. She also c/o perineal area itching. She wears pads to help with incontinence, but reports changing it frequently. She denies pelvic or flank pain. Her dysuria has improved with increased hydration. She also reported dark urine, which has also resolved now with increased hydration.  Past Medical History:  Diagnosis Date  . Allergic rhinitis   . Arthritis HIPS/ WRISTS  . Cancer ([place])   . DDD (degenerative disc disease), cervical   . GERD (gastroesophageal reflux disease) OCCASIONAL  . Heart murmur    turbulent flow through LVOT, no obstruction 08/26/18 echo  . History of Bell's palsy 2011  RIGHT SIDE -- RESOLVED  . 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 History of CVA (cerebrovascular accident) PER SCAN IN 2011  . Hyperlipidemia   . Hypertension   . Impaired fasting glucose PER PCP  DR SIMPSON   WATCH DIET  . Mixed urge and stress incontinence   . Nocturia   . Sinus drainage   . Thoracic ascending aortic aneurysm ([place]) LAST CHEST CT 09-02-2010--  FOLLOWED BY  CARDIOLOGIST--  DR [id number]  . Vaginal wall prolapse     Past Surgical History:  Procedure Laterality Date  . ABDOMINAL HYSTERECTOMY    . BILATERAL CARPAL TUNNEL RELEASE  1980'S  . CARDIAC CATHETERIZATION  02-01-2009  DR EICHHORN   NORMAL CORONARY ARTERIES/ NORMAL LV SIZE AND FUNCTION/ AORTIC ROOT SIZE AT THE UPPER LIMIT OF NORMAL   . CARPAL TUNNEL RELEASE    . CERVICAL DISC SURGERY  12-12-2005  DR Vertell Limber   LEFT  C6 - C7 HERINATED /  DDD/ SPONDYLOSIS  . COLONOSCOPY N/A 02/10/2014   Procedure: COLONOSCOPY;  Surgeon: Danie Binder, MD;  [facility];  Service: Endoscopy;  Laterality: N/A;  8:30  . CYSTOCELE REPAIR  08/02/2012   Procedure: ANTERIOR REPAIR (CYSTOCELE);  Surgeon: Ailene Rud, MD;  Location: [facility];  Service: Urology;  Laterality: N/A;  [employer] Uphold Anterior Pelvic Floor Sacrospinus Repair. Anterior wall of the vagina.  . ESOPHAGOGASTRODUODENOSCOPY N/A 02/10/2014   Procedure: ESOPHAGOGASTRODUODENOSCOPY (EGD);  Surgeon: Danie Binder, MD;  [facility];  Service: Endoscopy;  Laterality: N/A;  . GIVENS CAPSULE STUDY N/A 03/13/2014   Procedure: GIVENS CAPSULE STUDY;  Surgeon: Danie Binder, MD;  [facility];  Service: Endoscopy;  Laterality: N/A;  7:30  . JOINT REPLACEMENT N/A    Phreesia 06/16/2020  . NECK SURGERY  2007  . RIGHT PARTIAL MASTECTOMY  11-11-2004  DR [name] YOUNG   DUCTAL CARCINOMA IN SITU RIGHT BREAST  . TOTAL ABDOMINAL HYSTERECTOMY W/ BILATERAL SALPINGOOPHORECTOMY  1990  . TOTAL KNEE ARTHROPLASTY Right 12/21/2019   Procedure: RIGHT  TOTAL KNEE ARTHROPLASTY;  Surgeon: [name], MD;  Location: [facility];  Service: Orthopedics;  Laterality: Right;  . TRANSTHORACIC ECHOCARDIOGRAM  03-30-2008  DR MARGARET SIMPSON   LV SIZE AND FUNCTION NORMAL/ MODERATE AORTIC ARCH DILATATION/ MILD MR  Family History  Problem Relation Age of Onset  . Kidney failure Mother   . Hypertension Mother   . Heart attack Mother 65  . Throat cancer Father   . Prostate cancer Father   . Lung cancer Father 57  . Breast cancer Sister   . Hypertension Brother        2 stents  . Colon cancer Neg Hx   . Colon polyps Neg Hx     Social History   Socioeconomic History  . Marital status: Divorced    Spouse name: Not on file  . Number of children: 4  . Years of education: 12+  . Highest education level: 12th grade  Occupational History  . Not on  file  Tobacco Use  . Smoking status: Never Smoker  . Smokeless tobacco: Never Used  Vaping Use  . Vaping Use: Never used  Substance and Sexual Activity  . Alcohol use: No  . Drug use: No  . Sexual activity: Not Currently  Other Topics Concern  . Not on file  Social History Narrative   RETIRED IN June 2020.    Social Determinants of Health   Financial Resource Strain: Low Risk   . Difficulty of Paying Living Expenses: Not very hard  Food Insecurity: No Food Insecurity  . Worried About Charity fundraiser in the Last Year: Never true  . Ran Out of Food in the Last Year: Never true  Transportation Needs: No Transportation Needs  . Lack of Transportation (Medical): No  . Lack of Transportation (Non-Medical): No  Physical Activity: Sufficiently Active  . Days of Exercise per Week: 4 days  . Minutes of Exercise per Session: 40 min  Stress: No Stress Concern Present  . Feeling of Stress : Not at all  Social Connections: Moderately Integrated  . Frequency of Communication with Friends and Family: More than three times a week  . Frequency of Social Gatherings with Friends and Family: Three times a week  . Attends Religious Services: More than 4 times per year  . Active Member of Clubs or Organizations: Yes  . Attends Archivist Meetings: More than 4 times per year  . Marital Status: Divorced  Human resources officer Violence: Not At Risk  . Fear of Current or Ex-Partner: No  . Emotionally Abused: No  . Physically Abused: No  . Sexually Abused: No    Outpatient Medications Prior to Visit  Medication Sig Dispense Refill  . acetaminophen (TYLENOL) 500 MG tablet Take one to  two tablets by mouth at night for pain 50 tablet 0  . Artificial Tear Solution (SOOTHE XP OP) Place 2 drops into both ears daily as needed (Dry eye).    . Ascorbic Acid (VITAMIN C) 1000 MG tablet Take 1,000 mg by mouth daily.    Marland Kitchen aspirin EC 325 MG tablet Take 1 tablet (325 mg total) by mouth daily. 100  tablet 3  . betamethasone dipropionate (DIPROLENE) 0.05 % cream Apply sparingly two times daily for 10 days, then as needed, to affected areas( hairline, neck) 45 g 1  . calcium-vitamin D (OSCAL WITH D) 500-200 MG-UNIT tablet Take 1 tablet by mouth daily with breakfast.    . carvedilol (COREG) 3.125 MG tablet Take 1 tablet (3.125 mg total) by mouth 2 (two) times daily with a meal. 180 tablet 3  . Cetirizine HCl 10 MG TBDP Take 1 tablet by mouth daily. (Patient taking differently: Take 10 mg by mouth daily.) 90 tablet 3  . Cholecalciferol (  VITAMIN D) 50 MCG (2000 UT) tablet Take 2,000 Units by mouth daily.     Marland Kitchen docusate sodium (COLACE) 100 MG capsule Take 100 mg by mouth daily as needed for mild constipation.    . Ferrous Sulfate (SLOW FE) 142 (45 Fe) MG TBCR Take 142 mg by mouth daily. (Patient taking differently: Take 142 mg by mouth every other day.) 30 tablet 1  . gabapentin (NEURONTIN) 100 MG capsule TAKE 2 CAPSULES BY MOUTH ONCE DAILY AT BEDTIME 180 capsule 0  . guaifenesin (HUMIBID E) 400 MG TABS tablet Take 400 mg by mouth daily as needed (Mucus build up). Mucus relief    . hydrochlorothiazide (HYDRODIURIL) 25 MG tablet TAKE 1 TABLET BY MOUTH ONCE DAILY IN THE MORNING 90 tablet 0  . ibuprofen (ADVIL) 800 MG tablet Take one tablet by mouth at bedtime , as needed, for right knee pain. Maximum is 4 tablets per week. 30 tablet 0  . losartan (COZAAR) 50 MG tablet Take 1/2 (one-half) tablet by mouth once daily 45 tablet 3  . lovastatin (MEVACOR) 40 MG tablet Take 1 tablet (40 mg total) by mouth daily. 90 tablet 2  . MAGNESIUM PO Take by mouth every other day.    . montelukast (SINGULAIR) 10 MG tablet TAKE 1 TABLET BY MOUTH AT BEDTIME 90 tablet 0  . Multiple Vitamin (MULTIVITAMIN) tablet Take 1 tablet by mouth daily. One a day woman's    . olopatadine (PATANOL) 0.1 % ophthalmic solution Place 1 drop into both eyes daily.    . potassium chloride (KLOR-CON) 10 MEQ tablet Take 1 tablet by mouth once  daily 30 tablet 0  . zinc gluconate 50 MG tablet Take 50 mg by mouth daily.     No facility-administered medications prior to visit.    Allergies  Allergen Reactions  . Codeine Other (See Comments)    CONFUSION/ DIZZY Other reaction(s): Other (see comments) CONFUSION/ DIZZY  . Quinolones Other (See Comments)    Patient was warned about not using Cipro and similar antibiotics. Recent studies have raised concern that fluoroquinolone antibiotics could be associated with an increased risk of aortic aneurysm Fluoroquinolones have non-antimicrobial properties that might jeopardise the integrity of the extracellular matrix of the vascular wall I. Other reaction(s): Other (see comments) Patient was warned about not using Cipro and similar antibiotics. Recent studies have raised concern that fluoroquinolone antibiotics could be associated with an increased risk of aortic aneurysm Fluoroquinolones have non-antimicrobial properties that might jeopardise the integrity of the extracellular matrix of the vascular wall I.    Review of Systems  Constitutional: Negative for chills and fever.  Gastrointestinal: Negative for abdominal pain, constipation, diarrhea, nausea and vomiting.  Genitourinary: Positive for dysuria and frequency. Negative for hematuria.       Perineal area itching       Objective:    Physical Exam Vitals reviewed.  Constitutional:      General: She is not in acute distress.    Appearance: She is not diaphoretic.  HENT:     Head: Normocephalic and atraumatic.     Nose: Nose normal.     Mouth/Throat:     Mouth: Mucous membranes are moist.  Eyes:     General: No scleral icterus.    Extraocular Movements: Extraocular movements intact.     Pupils: Pupils are equal, round, and reactive to light.  Cardiovascular:     Rate and Rhythm: Normal rate and regular rhythm.     Pulses: Normal pulses.  Heart sounds: Murmur (Systolic - right upper sternal border) heard.     Pulmonary:     Breath sounds: Normal breath sounds. No wheezing or rales.  Abdominal:     Palpations: Abdomen is soft.     Tenderness: There is no abdominal tenderness. There is no right CVA tenderness, left CVA tenderness, guarding or rebound.  Musculoskeletal:     Cervical back: Neck supple. No tenderness.     Right lower leg: No edema.     Left lower leg: No edema.  Skin:    General: Skin is warm.     Findings: No rash.  Neurological:     General: No focal deficit present.     Mental Status: She is alert and oriented to person, place, and time.  Psychiatric:        Mood and Affect: Mood normal.        Behavior: Behavior normal.      BP 134/81 (BP Location: Right Arm, Patient Position: Sitting, Cuff Size: Normal)   Pulse 72   Temp 98.4 F (36.9 C) (Oral)   Resp 18   Ht 5' 1.5" (1.562 m)   Wt 190 lb (86.2 kg)   SpO2 99%   BMI 35.32 kg/m  Wt Readings from Last 3 Encounters:  10/11/20 190 lb (86.2 kg)  09/13/20 187 lb (84.8 kg)  08/16/20 187 lb (84.8 kg)    There are no preventive care reminders to display for this patient.  There are no preventive care reminders to display for this patient.   Lab Results  Component Value Date   TSH 3.36 10/18/2019   Lab Results  Component Value Date   WBC 4.4 05/11/2020   HGB 11.2 (L) 05/11/2020   HCT 34.3 (L) 05/11/2020   MCV 84.5 05/11/2020   PLT 331 05/11/2020   Lab Results  Component Value Date   NA 140 05/11/2020   K 4.1 05/11/2020   CO2 31 05/11/2020   GLUCOSE 100 (H) 05/11/2020   BUN 22 05/11/2020   CREATININE 1.02 (H) 05/11/2020   BILITOT 0.6 05/11/2020   ALKPHOS 70 12/14/2019   AST 19 05/11/2020   ALT 9 05/11/2020   PROT 7.2 05/11/2020   ALBUMIN 3.6 12/14/2019   CALCIUM 9.9 05/11/2020   ANIONGAP 11 12/14/2019   Lab Results  Component Value Date   CHOL 193 05/11/2020   Lab Results  Component Value Date   HDL 85 05/11/2020   Lab Results  Component Value Date   LDLCALC 94 05/11/2020   Lab  Results  Component Value Date   TRIG 58 05/11/2020   Lab Results  Component Value Date   CHOLHDL 2.3 05/11/2020   Lab Results  Component Value Date   HGBA1C 5.6 05/27/2014       Assessment & Plan:   Problem List Items Addressed This Visit   None   Visit Diagnoses    Acute vaginitis    -  Primary Has perineal are itching Pads can be a contributing factor as well Fluconazole prescribed   Relevant Medications   fluconazole (DIFLUCAN) 150 MG tablet   Dysuria     UA negative for LE or nitrite Symptoms improved with increased hydration Likely due to dehydration Continue adequate hydration and Azo PRN   Relevant Medications   phenazopyridine (PYRIDIUM) 100 MG tablet   Other Relevant Orders   POCT URINALYSIS DIP (CLINITEK) (Completed)       Meds ordered this encounter  Medications  . fluconazole (DIFLUCAN) 150 MG  tablet    Sig: Take 1 tablet (150 mg total) by mouth once for 1 dose.    Dispense:  1 tablet    Refill:  0  . phenazopyridine (PYRIDIUM) 100 MG tablet    Sig: Take 1 tablet (100 mg total) by mouth 3 (three) times daily as needed for pain.    Dispense:  20 tablet    Refill:  0     Tirso Laws Keith Rake, MD

## 2020-10-22 ENCOUNTER — Other Ambulatory Visit: Payer: Self-pay | Admitting: Family Medicine

## 2020-10-22 ENCOUNTER — Encounter: Payer: PPO | Admitting: Family Medicine

## 2020-11-14 ENCOUNTER — Other Ambulatory Visit: Payer: Self-pay | Admitting: Family Medicine

## 2020-11-22 ENCOUNTER — Other Ambulatory Visit: Payer: Self-pay | Admitting: Family Medicine

## 2020-11-29 DIAGNOSIS — E669 Obesity, unspecified: Secondary | ICD-10-CM | POA: Diagnosis not present

## 2020-11-29 DIAGNOSIS — E559 Vitamin D deficiency, unspecified: Secondary | ICD-10-CM | POA: Diagnosis not present

## 2020-11-29 DIAGNOSIS — E8881 Metabolic syndrome: Secondary | ICD-10-CM | POA: Diagnosis not present

## 2020-11-29 DIAGNOSIS — E785 Hyperlipidemia, unspecified: Secondary | ICD-10-CM | POA: Diagnosis not present

## 2020-11-30 LAB — LIPID PANEL
Chol/HDL Ratio: 2 ratio (ref 0.0–4.4)
Cholesterol, Total: 181 mg/dL (ref 100–199)
HDL: 92 mg/dL (ref >39)
LDL Chol Calc (NIH): 80 mg/dL (ref 0–99)
Triglycerides: 43 mg/dL (ref 0–149)
VLDL Cholesterol Cal: 9 mg/dL (ref 5–40)

## 2020-11-30 LAB — CMP14+EGFR
ALT: 11 IU/L (ref 0–32)
AST: 22 IU/L (ref 0–40)
Albumin/Globulin Ratio: 1.7 (ref 1.2–2.2)
Albumin: 4.4 g/dL (ref 3.7–4.7)
Alkaline Phosphatase: 93 IU/L (ref 44–121)
BUN/Creatinine Ratio: 21 (ref 12–28)
BUN: 22 mg/dL (ref 8–27)
Bilirubin Total: 0.5 mg/dL (ref 0.0–1.2)
CO2: 24 mmol/L (ref 20–29)
Calcium: 9.5 mg/dL (ref 8.7–10.3)
Chloride: 102 mmol/L (ref 96–106)
Creatinine, Ser: 1.04 mg/dL — ABNORMAL HIGH (ref 0.57–1.00)
Globulin, Total: 2.6 g/dL (ref 1.5–4.5)
Glucose: 99 mg/dL (ref 65–99)
Potassium: 3.5 mmol/L (ref 3.5–5.2)
Sodium: 144 mmol/L (ref 134–144)
Total Protein: 7 g/dL (ref 6.0–8.5)
eGFR: 56 mL/min/{1.73_m2} — ABNORMAL LOW (ref >59)

## 2020-11-30 LAB — TSH: TSH: 3.38 u[IU]/mL (ref 0.450–4.500)

## 2020-11-30 LAB — VITAMIN D 25 HYDROXY (VIT D DEFICIENCY, FRACTURES): Vit D, 25-Hydroxy: 34.4 ng/mL (ref 30.0–100.0)

## 2020-12-06 ENCOUNTER — Encounter: Payer: Self-pay | Admitting: Family Medicine

## 2020-12-06 ENCOUNTER — Ambulatory Visit (INDEPENDENT_AMBULATORY_CARE_PROVIDER_SITE_OTHER): Payer: PPO | Admitting: Family Medicine

## 2020-12-06 ENCOUNTER — Other Ambulatory Visit: Payer: Self-pay

## 2020-12-06 VITALS — BP 135/78 | HR 60 | Temp 98.6°F | Ht 61.5 in | Wt 188.0 lb

## 2020-12-06 DIAGNOSIS — Z0000 Encounter for general adult medical examination without abnormal findings: Secondary | ICD-10-CM

## 2020-12-06 DIAGNOSIS — Z1231 Encounter for screening mammogram for malignant neoplasm of breast: Secondary | ICD-10-CM

## 2020-12-06 DIAGNOSIS — Z1211 Encounter for screening for malignant neoplasm of colon: Secondary | ICD-10-CM

## 2020-12-06 MED ORDER — HYDROCHLOROTHIAZIDE 25 MG PO TABS: 25.0000 mg | ORAL_TABLET | Freq: Every day | ORAL | 3 refills | Status: DC

## 2020-12-06 NOTE — Progress Notes (Signed)
    Desiree [name]     MRN: [id number]      DOB: 02/13/1946  HPI: Patient is in for annual physical exam. No other health concerns are expressed or addressed at the visit. Recent labs,  are reviewed. Immunization is reviewed , and  updated if needed.   PE: BP 135/78 (BP Location: Right Arm, Patient Position: Sitting, Cuff Size: Normal)   Pulse 60   Temp 98.6 F (37 C) (Temporal)   Ht 5' 1.5" (1.562 m)   Wt 188 lb (85.3 kg)   SpO2 96%   BMI 34.95 kg/m   Pleasant  female, alert and oriented x 3, in no cardio-pulmonary distress. Afebrile. HEENT No facial trauma or asymetry. Sinuses non tender.  Extra occullar muscles intact.. External ears normal, . Neck: supple, no adenopathy,JVD or thyromegaly.No bruits.  Chest: Clear to ascultation bilaterally.No crackles or wheezes. Non tender to palpation  Breast: Not examined, asymptomatic, normal mammogram in 2021  Cardiovascular system; Heart sounds normal,  S1 and  S2 ,no S3.  No murmur, or thrill. Apical beat not displaced Peripheral pulses normal.  Abdomen: Soft, non tender, no organomegaly or masses. No bruits. Bowel sounds normal. No guarding, tenderness or rebound.       Musculoskeletal exam: Full ROM of spine, hips , shoulders and knees. No deformity ,swelling or crepitus noted. No muscle wasting or atrophy.   Neurologic: Cranial nerves 2 to 12 intact. Power, tone ,sensation and reflexes normal throughout. No disturbance in gait. No tremor.  Skin: Intact, no ulceration, erythema , scaling or rash noted. Pigmentation normal throughout  Psych; Normal mood and affect. Judgement and concentration normal   Assessment & Plan:  Annual physical exam Annual exam as documented. Counseling done  re healthy lifestyle involving commitment to 150 minutes exercise per week, heart healthy diet, and attaining healthy weight.The importance of adequate sleep also discussed. Regular seat belt use and home safety, is  also discussed. Changes in health habits are decided on by the patient with goals and time frames  set for achieving them. Immunization and cancer screening needs are specifically addressed at this visit.

## 2020-12-06 NOTE — Assessment & Plan Note (Signed)

## 2020-12-06 NOTE — Patient Instructions (Addendum)
F/U in 6 months, call if you need me sooner  Covid booster due May 11 or after, please get this  Please schedule mammogram at checkout  Please arrange for cologuard test at checkout  It is important that you exercise regularly at least 30 minutes 5 times a week. If you develop chest pain, have severe difficulty breathing, or feel very tired, stop exercising immediately and seek medical attention    Thanks for choosing [facility], we consider it a privelige to serve you.   [email or web address]">  DASH Eating Plan DASH stands for Dietary Approaches to Stop Hypertension. The DASH eating plan is a healthy eating plan that has been shown to:  Reduce high blood pressure (hypertension).  Reduce your risk for type 2 diabetes, heart disease, and stroke.  Help with weight loss. What are tips for following this plan? Reading food labels  Check food labels for the amount of salt (sodium) per serving. Choose foods with less than 5 percent of the Daily Value of sodium. Generally, foods with less than 300 milligrams (mg) of sodium per serving fit into this eating plan.  To find whole grains, look for the word "whole" as the first word in the ingredient list. Shopping  Buy products labeled as "low-sodium" or "no salt added."  Buy fresh foods. Avoid canned foods and pre-made or frozen meals. Cooking  Avoid adding salt when cooking. Use salt-free seasonings or herbs instead of table salt or sea salt. Check with your health care provider or pharmacist before using salt substitutes.  Do not fry foods. Cook foods using healthy methods such as baking, boiling, grilling, roasting, and broiling instead.  Cook with heart-healthy oils, such as olive, canola, avocado, soybean, or sunflower oil. Meal planning  Eat a balanced diet that includes: ? 4 or more servings of fruits and 4 or more servings of vegetables each day. Try to fill one-half of  your plate with fruits and vegetables. ? 6-8 servings of whole grains each day. ? Less than 6 oz (170 g) of lean meat, poultry, or fish each day. A 3-oz (85-g) serving of meat is about the same size as a deck of cards. One egg equals 1 oz (28 g). ? 2-3 servings of low-fat dairy each day. One serving is 1 cup (237 mL). ? 1 serving of nuts, seeds, or beans 5 times each week. ? 2-3 servings of heart-healthy fats. Healthy fats called omega-3 fatty acids are found in foods such as walnuts, flaxseeds, fortified milks, and eggs. These fats are also found in cold-water fish, such as sardines, salmon, and mackerel.  Limit how much you eat of: ? Canned or prepackaged foods. ? Food that is high in trans fat, such as some fried foods. ? Food that is high in saturated fat, such as fatty meat. ? Desserts and other sweets, sugary drinks, and other foods with added sugar. ? Full-fat dairy products.  Do not salt foods before eating.  Do not eat more than 4 egg yolks a week.  Try to eat at least 2 vegetarian meals a week.  Eat more home-cooked food and less restaurant, buffet, and fast food.   Lifestyle  When eating at a restaurant, ask that your food be prepared with less salt or no salt, if possible.  If you drink alcohol: ? Limit how much you use to:  0-1 drink a day for women who are not pregnant.  0-2 drinks a day for men. ? Be aware of how much alcohol  is in your drink. In the [place] one drink equals one 12 oz bottle of beer (355 mL), one 5 oz glass of wine (148 mL), or one 1 oz glass of hard liquor (44 mL). General information  Avoid eating more than 2,300 mg of salt a day. If you have hypertension, you may need to reduce your sodium intake to 1,500 mg a day.  Work with your health care provider to maintain a healthy body weight or to lose weight. Ask what an ideal weight is for you.  Get at least 30 minutes of exercise that causes your heart to beat faster (aerobic exercise) most days of  the week. Activities may include walking, swimming, or biking.  Work with your health care provider or dietitian to adjust your eating plan to your individual calorie needs. What foods should I eat? Fruits All fresh, dried, or frozen fruit. Canned fruit in natural juice (without added sugar). Vegetables Fresh or frozen vegetables (raw, steamed, roasted, or grilled). Low-sodium or reduced-sodium tomato and vegetable juice. Low-sodium or reduced-sodium tomato sauce and tomato paste. Low-sodium or reduced-sodium canned vegetables. Grains Whole-grain or whole-wheat bread. Whole-grain or whole-wheat pasta. Brown rice. [place]. Bulgur. Whole-grain and low-sodium cereals. Pita bread. Low-fat, low-sodium crackers. Whole-wheat flour tortillas. Meats and other proteins Skinless chicken or [place]. Ground chicken or [place]. Pork with fat trimmed off. Fish and seafood. Egg whites. Dried beans, peas, or lentils. Unsalted nuts, nut butters, and seeds. Unsalted canned beans. Lean cuts of beef with fat trimmed off. Low-sodium, lean precooked or cured meat, such as sausages or meat loaves. Dairy Low-fat (1%) or fat-free (skim) milk. Reduced-fat, low-fat, or fat-free cheeses. Nonfat, low-sodium ricotta or cottage cheese. Low-fat or nonfat yogurt. Low-fat, low-sodium cheese. Fats and oils Soft margarine without trans fats. Vegetable oil. Reduced-fat, low-fat, or light mayonnaise and salad dressings (reduced-sodium). Canola, safflower, olive, avocado, soybean, and sunflower oils. Avocado. Seasonings and condiments Herbs. Spices. Seasoning mixes without salt. Other foods Unsalted popcorn and pretzels. Fat-free sweets. The items listed above may not be a complete list of foods and beverages you can eat. Contact a dietitian for more information. What foods should I avoid? Fruits Canned fruit in a light or heavy syrup. Fried fruit. Fruit in cream or butter sauce. Vegetables Creamed or fried vegetables.  Vegetables in a cheese sauce. Regular canned vegetables (not low-sodium or reduced-sodium). Regular canned tomato sauce and paste (not low-sodium or reduced-sodium). Regular tomato and vegetable juice (not low-sodium or reduced-sodium). Angie Fava. Olives. Grains Baked goods made with fat, such as croissants, muffins, or some breads. Dry pasta or rice meal packs. Meats and other proteins Fatty cuts of meat. Ribs. Fried meat. Berniece Salines. Bologna, salami, and other precooked or cured meats, such as sausages or meat loaves. Fat from the back of a pig (fatback). Bratwurst. Salted nuts and seeds. Canned beans with added salt. Canned or smoked fish. Whole eggs or egg yolks. Chicken or [place] with skin. Dairy Whole or 2% milk, cream, and half-and-half. Whole or full-fat cream cheese. Whole-fat or sweetened yogurt. Full-fat cheese. Nondairy creamers. Whipped toppings. Processed cheese and cheese spreads. Fats and oils Butter. Stick margarine. Lard. Shortening. Ghee. Bacon fat. Tropical oils, such as coconut, palm kernel, or palm oil. Seasonings and condiments Onion salt, garlic salt, seasoned salt, table salt, and sea salt. Worcestershire sauce. Tartar sauce. Barbecue sauce. Teriyaki sauce. Soy sauce, including reduced-sodium. Steak sauce. Canned and packaged gravies. Fish sauce. Oyster sauce. Cocktail sauce. Store-bought horseradish. Ketchup. Mustard. Meat flavorings and tenderizers. Bouillon cubes.  Hot sauces. Pre-made or packaged marinades. Pre-made or packaged taco seasonings. Relishes. Regular salad dressings. Other foods Salted popcorn and pretzels. The items listed above may not be a complete list of foods and beverages you should avoid. Contact a dietitian for more information. Where to find more information  National Heart, Lung, and Blood Institute: https://wilson-eaton.com/  American Heart Association: www.heart.org  Academy of Nutrition and Dietetics: www.eatright.[place]:  www.kidney.org Summary  The DASH eating plan is a healthy eating plan that has been shown to reduce high blood pressure (hypertension). It may also reduce your risk for type 2 diabetes, heart disease, and stroke.  When on the DASH eating plan, aim to eat more fresh fruits and vegetables, whole grains, lean proteins, low-fat dairy, and heart-healthy fats.  With the DASH eating plan, you should limit salt (sodium) intake to 2,300 mg a day. If you have hypertension, you may need to reduce your sodium intake to 1,500 mg a day.  Work with your health care provider or dietitian to adjust your eating plan to your individual calorie needs. This information is not intended to replace advice given to you by your health care provider. Make sure you discuss any questions you have with your health care provider. Document Revised: 07/15/2019 Document Reviewed: 07/15/2019 Elsevier Patient Education  2021 [employer].

## 2021-01-04 ENCOUNTER — Other Ambulatory Visit: Payer: Self-pay | Admitting: Family Medicine

## 2021-01-18 DIAGNOSIS — D631 Anemia in chronic kidney disease: Secondary | ICD-10-CM | POA: Diagnosis not present

## 2021-01-18 DIAGNOSIS — I712 Thoracic aortic aneurysm, without rupture: Secondary | ICD-10-CM | POA: Diagnosis not present

## 2021-01-18 DIAGNOSIS — I129 Hypertensive chronic kidney disease with stage 1 through stage 4 chronic kidney disease, or unspecified chronic kidney disease: Secondary | ICD-10-CM | POA: Diagnosis not present

## 2021-01-18 DIAGNOSIS — E559 Vitamin D deficiency, unspecified: Secondary | ICD-10-CM | POA: Diagnosis not present

## 2021-01-18 DIAGNOSIS — N1831 Chronic kidney disease, stage 3a: Secondary | ICD-10-CM | POA: Diagnosis not present

## 2021-01-18 DIAGNOSIS — N189 Chronic kidney disease, unspecified: Secondary | ICD-10-CM | POA: Diagnosis not present

## 2021-01-18 DIAGNOSIS — N281 Cyst of kidney, acquired: Secondary | ICD-10-CM | POA: Diagnosis not present

## 2021-01-23 ENCOUNTER — Other Ambulatory Visit: Payer: Self-pay | Admitting: Family Medicine

## 2021-01-31 DIAGNOSIS — I129 Hypertensive chronic kidney disease with stage 1 through stage 4 chronic kidney disease, or unspecified chronic kidney disease: Secondary | ICD-10-CM | POA: Diagnosis not present

## 2021-01-31 DIAGNOSIS — I712 Thoracic aortic aneurysm, without rupture: Secondary | ICD-10-CM | POA: Diagnosis not present

## 2021-01-31 DIAGNOSIS — N1831 Chronic kidney disease, stage 3a: Secondary | ICD-10-CM | POA: Diagnosis not present

## 2021-01-31 DIAGNOSIS — E6609 Other obesity due to excess calories: Secondary | ICD-10-CM | POA: Diagnosis not present

## 2021-02-01 ENCOUNTER — Encounter: Payer: Self-pay | Admitting: Family Medicine

## 2021-02-01 DIAGNOSIS — Z1211 Encounter for screening for malignant neoplasm of colon: Secondary | ICD-10-CM | POA: Diagnosis not present

## 2021-02-06 LAB — COLOGUARD: Cologuard: NEGATIVE

## 2021-02-07 ENCOUNTER — Other Ambulatory Visit: Payer: Self-pay | Admitting: Family Medicine

## 2021-02-14 ENCOUNTER — Other Ambulatory Visit: Payer: Self-pay | Admitting: Family Medicine

## 2021-02-20 ENCOUNTER — Ambulatory Visit (INDEPENDENT_AMBULATORY_CARE_PROVIDER_SITE_OTHER): Payer: PPO | Admitting: Cardiovascular Disease

## 2021-02-20 ENCOUNTER — Encounter: Payer: Self-pay | Admitting: Cardiovascular Disease

## 2021-02-20 ENCOUNTER — Other Ambulatory Visit: Payer: Self-pay

## 2021-02-20 VITALS — BP 130/72 | HR 54 | Ht 61.5 in | Wt 188.0 lb

## 2021-02-20 DIAGNOSIS — I712 Thoracic aortic aneurysm, without rupture: Secondary | ICD-10-CM | POA: Diagnosis not present

## 2021-02-20 DIAGNOSIS — I10 Essential (primary) hypertension: Secondary | ICD-10-CM

## 2021-02-20 DIAGNOSIS — E7800 Pure hypercholesterolemia, unspecified: Secondary | ICD-10-CM

## 2021-02-20 DIAGNOSIS — Q248 Other specified congenital malformations of heart: Secondary | ICD-10-CM

## 2021-02-20 DIAGNOSIS — I7121 Aneurysm of the ascending aorta, without rupture: Secondary | ICD-10-CM

## 2021-02-20 NOTE — Patient Instructions (Signed)

## 2021-02-20 NOTE — Progress Notes (Signed)
Cardiology Office Note   Date:  02/20/2021   ID:  Desiree [name], Desiree [name] 01-02-1946, MRN [id number]  PCP:  [name], MD  Cardiologist:  [name], MD Electrophysiologist:  None   Chief Complaint: Aortic aneurysm, HTN  History of Present Illness:    Desiree [name] is a 75 y.o. 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  Walking on a daily basis in [place].  Does not feel at all slow down by problems with dyspnea, angina denies dizziness, syncope, palpitations, claudication or lower extremity edema.  Her most recent imaging study was a CT angiogram performed in December 2021 that she actually measured the ascending aorta slightly smaller at 4.1 cm, essentially unchanged for the last 13 years.  She has a known dynamic systolic ejection murmur related to LV outflow tract obstruction, without the full phenotypic manifestations of hypertrophic cardiomyopathy.   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  RIGHT MASTECTMOY AND RADIATION--  NO RECURRANCE   History of CVA (cerebrovascular accident) PER SCAN IN 2011   Hyperlipidemia    Hypertension    Impaired fasting glucose PER PCP  DR Moshe Cipro   WATCH DIET   Mixed urge and stress incontinence    Nocturia    Sinus drainage    Thoracic ascending aortic aneurysm ([place]) LAST CHEST CT  09-02-2010--  FOLLOWED BY  CARDIOLOGIST--  DR DJSHFWYO   Vaginal wall prolapse    Past Surgical History:  Procedure Laterality Date   ABDOMINAL HYSTERECTOMY     BILATERAL CARPAL TUNNEL RELEASE  1980'S   CARDIAC CATHETERIZATION  02-01-2009  DR Worthington   NORMAL CORONARY ARTERIES/ NORMAL LV SIZE AND FUNCTION/ AORTIC ROOT SIZE AT THE UPPER LIMIT OF NORMAL    CARPAL TUNNEL RELEASE     CERVICAL [place] SURGERY  12-12-2005  DR Vertell Limber   LEFT  C6 - C7 HERINATED / DDD/ SPONDYLOSIS   COLONOSCOPY N/A 02/10/2014   Procedure: COLONOSCOPY;  Surgeon: Danie Binder, MD;  [facility];  Service: Endoscopy;  Laterality: N/A;  8:30   CYSTOCELE REPAIR  08/02/2012   Procedure: ANTERIOR REPAIR (CYSTOCELE);  Surgeon: Ailene Rud, MD;  Location[facility];  Service: Urology;  Laterality: N/A;  [employer] Uphold Anterior Pelvic Floor Sacrospinus Repair. Anterior wall of the vagina.   ESOPHAGOGASTRODUODENOSCOPY N/A 02/10/2014   Procedure: ESOPHAGOGASTRODUODENOSCOPY (EGD);  Surgeon: Danie Binder, MD;  [facility];  Service: Endoscopy;  Laterality: N/A;   GIVENS CAPSULE STUDY N/A 03/13/2014   Procedure: GIVENS CAPSULE STUDY;  Surgeon: Danie Binder, MD;  [facility];  Service: Endoscopy;  Laterality: N/A;  7:30   JOINT REPLACEMENT N/A    Phreesia 06/16/2020   NECK SURGERY  2007   RIGHT PARTIAL MASTECTOMY  11-11-2004  DR [name] YOUNG   DUCTAL CARCINOMA IN SITU RIGHT BREAST  TOTAL ABDOMINAL HYSTERECTOMY W/ BILATERAL SALPINGOOPHORECTOMY  1990   TOTAL KNEE ARTHROPLASTY Right 12/21/2019   Procedure: RIGHT  TOTAL KNEE ARTHROPLASTY;  Surgeon: [name], MD;  Location: [place];  Service: Orthopedics;  Laterality: Right;   TRANSTHORACIC ECHOCARDIOGRAM  03-30-2008  DR MARGARET SIMPSON   LV SIZE AND FUNCTION NORMAL/ MODERATE AORTIC ARCH DILATATION/ MILD MR     Current Meds  Medication Sig   acetaminophen (TYLENOL) 500 MG tablet Take one to  two tablets by  mouth at night for pain   Artificial Tear Solution (SOOTHE XP OP) Place 2 drops into both ears daily as needed (Dry eye).   Ascorbic Acid (VITAMIN C) 1000 MG tablet Take 1,000 mg by mouth daily.   aspirin EC 325 MG tablet Take 1 tablet (325 mg total) by mouth daily.   betamethasone dipropionate (DIPROLENE) 0.05 % cream Apply sparingly two times daily for 10 days, then as needed, to affected areas( hairline, neck)   calcium-vitamin D (OSCAL WITH D) 500-200 MG-UNIT tablet Take 1 tablet by mouth daily with breakfast.   carvedilol (COREG) 3.125 MG tablet Take 1 tablet (3.125 mg total) by mouth 2 (two) times daily with a meal.   Cetirizine HCl 10 MG TBDP Take 1 tablet by mouth daily. (Patient taking differently: Take 10 mg by mouth daily.)   Cholecalciferol (VITAMIN D) 50 MCG (2000 UT) tablet Take 2,000 Units by mouth daily.    docusate sodium (COLACE) 100 MG capsule Take 100 mg by mouth daily as needed for mild constipation.   gabapentin (NEURONTIN) 100 MG capsule TAKE 2 CAPSULES BY MOUTH ONCE DAILY AT BEDTIME   guaifenesin (HUMIBID E) 400 MG TABS tablet Take 400 mg by mouth daily as needed (Mucus build up). Mucus relief   hydrochlorothiazide (HYDRODIURIL) 25 MG tablet Take 1 tablet (25 mg total) by mouth daily.   ibuprofen (ADVIL) 800 MG tablet Take one tablet by mouth at bedtime , as needed, for right knee pain. Maximum is 4 tablets per week.   losartan (COZAAR) 50 MG tablet Take 1/2 (one-half) tablet by mouth once daily   lovastatin (MEVACOR) 40 MG tablet Take 1 tablet by mouth once daily   MAGNESIUM PO Take by mouth every other day.   montelukast (SINGULAIR) 10 MG tablet TAKE 1 TABLET BY MOUTH AT BEDTIME   Multiple Vitamin (MULTIVITAMIN) tablet Take 1 tablet by mouth daily. One a day woman's   olopatadine (PATANOL) 0.1 % ophthalmic solution Place 1 drop into both eyes daily.   phenazopyridine (PYRIDIUM) 100 MG tablet Take 1 tablet (100 mg total) by mouth 3 (three) times daily as needed for pain.    potassium chloride (KLOR-CON) 10 MEQ tablet Take 1 tablet by mouth once daily   zinc gluconate 50 MG tablet Take 50 mg by mouth daily.     Allergies:   Codeine and Quinolones   Social History   Tobacco Use   Smoking status: Never   Smokeless tobacco: Never  Vaping Use   Vaping Use: Never used  Substance Use Topics   Alcohol use: No   Drug use: No     Family Hx: The patient's family history includes Breast cancer in her sister; Heart attack (age of onset: 59) in her mother; Hypertension in her brother and mother; Kidney failure in her mother; Lung cancer (age of onset: 66) in her father; Prostate cancer in her father; Throat cancer in her father. There is no history of Colon cancer or Colon polyps.  ROS:  Please see the history of present illness.    All other systems are reviewed and are negative Prior CV studies:   The following studies were reviewed today:  CT angiogram of the aorta 08/09/2020 1. Stable 4.1 cm ascending thoracic aortic diameter. Recommend annual imaging followup by CTA or MRA. This recommendation follows 2010 ACCF/AHA/AATS/ACR/ASA/SCA/SCAI/SIR/STS/SVM Guidelines for the Diagnosis and Management of Patients with Thoracic Aortic Disease. Circulation. 2010; 121: U202-R427. Aortic aneurysm NOS (ICD10-I71.9) . 2. Otherwise stable exam without acute findings.   Echocardiogram January 2020 - Left ventricle: Turbulent flow through LVOT No obstruction. The    cavity size was normal. Wall thickness was increased in a pattern    of mild LVH. Systolic function was vigorous. The estimated    ejection fraction was in the range of 65% to 70%.  - Aortic valve: Valve area (VTI): 2.02 cm^2. Valve area (Vmax):    2.08 cm^2. Valve area (Vmean): 2.17 cm^2.    Labs/Other Tests and Data Reviewed:    EKG: ECG ordered today and personally reviewed shows sinus bradycardia, questionable left atrial abnormality, normal repolarization, QT 442 ms  Recent Labs: 05/11/2020:  Hemoglobin 11.2; Platelets 331 11/29/2020: ALT 11; BUN 22; Creatinine, Ser 1.04; Potassium 3.5; Sodium 144; TSH 3.380   Recent Lipid Panel Lab Results  Component Value Date/Time   CHOL 181 11/29/2020 10:55 AM   TRIG 43 11/29/2020 10:55 AM   HDL 92 11/29/2020 10:55 AM   CHOLHDL 2.0 11/29/2020 10:55 AM   CHOLHDL 2.3 05/11/2020 10:41 AM   LDLCALC 80 11/29/2020 10:55 AM   LDLCALC 94 05/11/2020 10:41 AM    Wt Readings from Last 3 Encounters:  02/20/21 188 lb (85.3 kg)  12/06/20 188 lb (85.3 kg)  10/11/20 190 lb (86.2 kg)     Objective:    Vital Signs:  BP 130/72   Pulse (!) 54   Ht 5' 1.5" (1.562 m)   Wt 188 lb (85.3 kg)   SpO2 98%   BMI 34.95 kg/m     General: Alert, oriented x3, no distress, moderately obese Head: no evidence of trauma, PERRL, EOMI, no exophtalmos or lid lag, no myxedema, no xanthelasma; normal ears, nose and oropharynx Neck: normal jugular venous pulsations and no hepatojugular reflux; brisk carotid pulses without delay and no carotid bruits Chest: clear to auscultation, no signs of consolidation by percussion or palpation, normal fremitus, symmetrical and full respiratory excursions Cardiovascular: normal position and quality of the apical impulse, regular rhythm, normal first and second heart sounds, 2/6 mid peaking systolic ejection murmur, intensifies slightly with a Valsalva maneuver; no diastolic murmurs, rubs or gallops Abdomen: no tenderness or distention, no masses by palpation, no abnormal pulsatility or arterial bruits, normal bowel sounds, no hepatosplenomegaly Extremities: no clubbing, cyanosis or edema; 2+ radial, ulnar and brachial pulses bilaterally; 2+ right femoral, posterior tibial and dorsalis pedis pulses; 2+ left femoral, posterior tibial and dorsalis pedis pulses; no subclavian or femoral bruits Neurological: grossly nonfocal Psych: Normal mood and affect    ASSESSMENT & PLAN:    1. Ascending aortic aneurysm ([place])   2. Left  ventricular outflow obstruction   3. Essential hypertension   4. Hypercholesterolemia      Asc Ao Aneurysm: Been unchanged in size for over 10 years.  I think we will increase the frequency of her CT scans to every other year, especially since she has a mild degree of kidney abnormality. LVOT obstruction: Been mild and described intermittently on echoes over the years.  Never symptomatic.  Ideally would keep her in a little bit of beta-blocker as long as her heart rate allows. HTN: Best choices are beta-blocker and ARB since she has LV outflow obstruction and an aneurysm. HLP: All lipid parameters in satisfactory range with an excellent HDL cholesterol. CKD: improved creatinine, most recently 1.04, GFR now >60. Has seen Dr. Lowanda Foster.    Patient Instructions  Medication Instructions:  No changes *If you need a refill on your cardiac medications before your next appointment, please call your pharmacy*   Lab Work: None ordered If you have labs (blood work) drawn today and your tests are completely normal, you will receive your results only by: [place] (if you have MyChart) OR A paper copy in the mail If you have any lab test that is abnormal or we need to change your treatment, we will call you to review the results.   Testing/Procedures: None ordered   Follow-Up: At [facility], you and your health needs are our priority.  As part of our continuing mission to provide you with exceptional heart care, we have created designated Provider Care Teams.  These Care Teams include your primary Cardiologist (physician) and Advanced Practice Providers (APPs -  Physician Assistants and Nurse Practitioners) who all work together to provide you with the care you need, when you need it.  We recommend signing up for the patient portal called "MyChart".  Sign up information is provided on this After Visit Summary.  MyChart is used to connect with patients for Virtual Visits (Telemedicine).   Patients are able to view lab/test results, encounter notes, upcoming appointments, etc.  Non-urgent messages can be sent to your provider as well.   To learn more about what you can do with MyChart, go to [email or web address].    Your next appointment:   12 month(s)  The format for your next appointment:   In Person  Provider:   You may see [name], MD or one of the following Advanced Practice Providers on your designated Care Team:   Almyra Deforest, PA-C Fabian Sharp, [place] or  Roby Lofts, PA-C    Signed, [name], MD  02/20/2021 1:39 PM    [place]

## 2021-02-22 NOTE — Progress Notes (Signed)
I called Desiree [name] to alert her of possible COVID-19 exposure during our clinic visit, since I tested positive for the illness the next morning (I had a negative test on the morning of the appointment). I asked her to monitor for fever, cough, dyspnea, sore throat, etc. She should test for COVID if these occur, call me or PCP to see if additional treatment is warranted.

## 2021-02-28 ENCOUNTER — Other Ambulatory Visit: Payer: Self-pay | Admitting: Cardiovascular Disease

## 2021-03-04 ENCOUNTER — Other Ambulatory Visit: Payer: Self-pay

## 2021-03-04 ENCOUNTER — Ambulatory Visit (HOSPITAL_COMMUNITY)
Admission: RE | Admit: 2021-03-04 | Discharge: 2021-03-04 | Disposition: A | Discharge status: Home / Self Care | Payer: PPO | Source: Ambulatory Visit | Attending: Family Medicine | Admitting: Family Medicine

## 2021-03-04 DIAGNOSIS — Z1231 Encounter for screening mammogram for malignant neoplasm of breast: Secondary | ICD-10-CM | POA: Insufficient documentation

## 2021-03-05 ENCOUNTER — Other Ambulatory Visit: Payer: Self-pay | Admitting: Cardiovascular Disease

## 2021-03-06 ENCOUNTER — Other Ambulatory Visit (HOSPITAL_COMMUNITY): Payer: Self-pay | Admitting: Family Medicine

## 2021-03-06 DIAGNOSIS — R928 Other abnormal and inconclusive findings on diagnostic imaging of breast: Secondary | ICD-10-CM

## 2021-03-25 ENCOUNTER — Other Ambulatory Visit: Payer: Self-pay | Admitting: Family Medicine

## 2021-03-26 ENCOUNTER — Ambulatory Visit (HOSPITAL_COMMUNITY)
Admission: RE | Admit: 2021-03-26 | Discharge: 2021-03-26 | Disposition: A | Discharge status: Home / Self Care | Payer: PPO | Source: Ambulatory Visit | Attending: Family Medicine | Admitting: Family Medicine

## 2021-03-26 ENCOUNTER — Other Ambulatory Visit: Payer: Self-pay

## 2021-03-26 DIAGNOSIS — R922 Inconclusive mammogram: Secondary | ICD-10-CM | POA: Diagnosis not present

## 2021-03-26 DIAGNOSIS — R928 Other abnormal and inconclusive findings on diagnostic imaging of breast: Secondary | ICD-10-CM

## 2021-04-07 DIAGNOSIS — Z20822 Contact with and (suspected) exposure to covid-19: Secondary | ICD-10-CM | POA: Diagnosis not present

## 2021-04-25 ENCOUNTER — Other Ambulatory Visit: Payer: Self-pay | Admitting: Family Medicine

## 2021-04-26 ENCOUNTER — Ambulatory Visit (INDEPENDENT_AMBULATORY_CARE_PROVIDER_SITE_OTHER): Payer: PPO | Admitting: Family Medicine

## 2021-04-26 ENCOUNTER — Encounter: Payer: Self-pay | Admitting: Family Medicine

## 2021-04-26 ENCOUNTER — Other Ambulatory Visit: Payer: Self-pay

## 2021-04-26 DIAGNOSIS — R058 Other specified cough: Secondary | ICD-10-CM

## 2021-04-26 DIAGNOSIS — J309 Allergic rhinitis, unspecified: Secondary | ICD-10-CM

## 2021-04-26 MED ORDER — PREDNISONE 10 MG PO TABS: 10.0000 mg | ORAL_TABLET | Freq: Two times a day (BID) | ORAL | 0 refills | Status: DC

## 2021-04-26 NOTE — Patient Instructions (Signed)
F/u as before, call if you need me sooner  Continue Robitussin, 1 to 2 teaspoons daily for cough, and I have prescribed a 5 day course of prednisone  Continue daily zyrtec  Thanks for choosing [facility], we consider it a privelige to serve you.

## 2021-04-26 NOTE — Assessment & Plan Note (Signed)
Current flare, 5 day course of prednisone prescribed, continued Robitussin

## 2021-04-26 NOTE — Progress Notes (Signed)
Virtual Visit via Telephone Note  I connected with Desiree [name] on 04/26/21 at  2:40 PM EDT by telephone and verified that I am speaking with the correct person using two identifiers.  Location: Patient: home  Provider: office   I discussed the limitations, risks, security and privacy concerns of performing an evaluation and management service by telephone and the availability of in person appointments. I also discussed with the patient that there may be a patient responsible charge related to this service. The patient expressed understanding and agreed to proceed.   History of Present Illness: 3 day h/o increased cough with clear sputum and post nasal drainage, no fever or chills. Allergy symptoms generally flare during Fall. No known covid contact, improving with Robitussin   Observations/Objective: There were no vitals taken for this visit. Good communication with no confusion and intact memory. Alert and oriented x 3 No signs of respiratory distress during speech   Assessment and Plan: Allergic cough Current flare, 5 day course of prednisone prescribed, continued Robitussin  Allergic rhinitis Increased symptoms in past 5 days, causing excess cough, add prednisone and continue Robitussin  Essential hypertension  '  Follow Up Instructions:    I discussed the assessment and treatment plan with the patient. The patient was provided an opportunity to ask questions and all were answered. The patient agreed with the plan and demonstrated an understanding of the instructions.   The patient was advised to call back or seek an in-person evaluation if the symptoms worsen or if the condition fails to improve as anticipated.  I provided  8 minutes of non-face-to-face time during this encounter.   Tula Nakayama, MD

## 2021-04-28 ENCOUNTER — Encounter: Payer: Self-pay | Admitting: Family Medicine

## 2021-04-28 NOTE — Assessment & Plan Note (Signed)
Increased symptoms in past 5 days, causing excess cough, add prednisone and continue Robitussin

## 2021-05-07 DIAGNOSIS — H35033 Hypertensive retinopathy, bilateral: Secondary | ICD-10-CM | POA: Diagnosis not present

## 2021-05-07 DIAGNOSIS — H25013 Cortical age-related cataract, bilateral: Secondary | ICD-10-CM | POA: Diagnosis not present

## 2021-05-07 DIAGNOSIS — H524 Presbyopia: Secondary | ICD-10-CM | POA: Diagnosis not present

## 2021-05-07 DIAGNOSIS — H35413 Lattice degeneration of retina, bilateral: Secondary | ICD-10-CM | POA: Diagnosis not present

## 2021-05-07 DIAGNOSIS — H2513 Age-related nuclear cataract, bilateral: Secondary | ICD-10-CM | POA: Diagnosis not present

## 2021-05-19 ENCOUNTER — Other Ambulatory Visit: Payer: Self-pay | Admitting: Family Medicine

## 2021-05-19 ENCOUNTER — Other Ambulatory Visit: Payer: Self-pay | Admitting: Cardiovascular Disease

## 2021-05-30 DIAGNOSIS — N1831 Chronic kidney disease, stage 3a: Secondary | ICD-10-CM | POA: Diagnosis not present

## 2021-05-30 DIAGNOSIS — I129 Hypertensive chronic kidney disease with stage 1 through stage 4 chronic kidney disease, or unspecified chronic kidney disease: Secondary | ICD-10-CM | POA: Diagnosis not present

## 2021-05-30 DIAGNOSIS — I712 Thoracic aortic aneurysm, without rupture, unspecified: Secondary | ICD-10-CM | POA: Diagnosis not present

## 2021-06-06 DIAGNOSIS — I129 Hypertensive chronic kidney disease with stage 1 through stage 4 chronic kidney disease, or unspecified chronic kidney disease: Secondary | ICD-10-CM | POA: Diagnosis not present

## 2021-06-06 DIAGNOSIS — N189 Chronic kidney disease, unspecified: Secondary | ICD-10-CM | POA: Diagnosis not present

## 2021-06-06 DIAGNOSIS — N1832 Chronic kidney disease, stage 3b: Secondary | ICD-10-CM | POA: Diagnosis not present

## 2021-06-06 DIAGNOSIS — I7121 Aneurysm of the ascending aorta, without rupture: Secondary | ICD-10-CM | POA: Diagnosis not present

## 2021-06-06 DIAGNOSIS — D631 Anemia in chronic kidney disease: Secondary | ICD-10-CM | POA: Diagnosis not present

## 2021-06-12 ENCOUNTER — Ambulatory Visit (INDEPENDENT_AMBULATORY_CARE_PROVIDER_SITE_OTHER): Payer: PPO | Admitting: Family Medicine

## 2021-06-12 ENCOUNTER — Other Ambulatory Visit: Payer: Self-pay

## 2021-06-12 ENCOUNTER — Encounter: Payer: Self-pay | Admitting: Family Medicine

## 2021-06-12 VITALS — BP 152/90 | HR 105 | Resp 16 | Ht 60.75 in | Wt 187.0 lb

## 2021-06-12 DIAGNOSIS — Z0001 Encounter for general adult medical examination with abnormal findings: Secondary | ICD-10-CM

## 2021-06-12 DIAGNOSIS — Z23 Encounter for immunization: Secondary | ICD-10-CM | POA: Diagnosis not present

## 2021-06-12 DIAGNOSIS — I10 Essential (primary) hypertension: Secondary | ICD-10-CM

## 2021-06-12 MED ORDER — LOSARTAN POTASSIUM 50 MG PO TABS
50.0000 mg | ORAL_TABLET | Freq: Every day | ORAL | 1 refills | Status: DC
Start: 2021-06-12 — End: 2021-07-10

## 2021-06-12 MED ORDER — POTASSIUM CHLORIDE ER 10 MEQ PO TBCR: 10.0000 meq | EXTENDED_RELEASE_TABLET | Freq: Every day | ORAL | 1 refills | Status: DC

## 2021-06-12 NOTE — Assessment & Plan Note (Signed)

## 2021-06-12 NOTE — Patient Instructions (Signed)
Annual exam in 366 days  F/u re evaluate blood pressure in mid December  Flu vaccine today  CBC today  Increase losartan 50 mg to oNE tablet once daily, blood pressur eis high  Document eye exam at checkout please nurse  It is important that you exercise regularly at least 30 minutes 5 times a week. If you develop chest pain, have severe difficulty breathing, or feel very tired, stop exercising immediately and seek medical attention

## 2021-06-12 NOTE — Progress Notes (Signed)
    Desiree [name]     MRN: [id number]      DOB: Jun 30, 1946  HPI: Patient is in for annual physical exam. Uncontrolled hypertension is addressed at this visit Recent labs,  are reviewed. Immunization is reviewed , and  updated    PE: BP (!) 152/90   Pulse (!) 105   Resp 16   Ht 5' 0.75" (1.543 m)   Wt 187 lb (84.8 kg)   SpO2 94%   BMI 35.62 kg/m   Pleasant  female, alert and oriented x 3, in no cardio-pulmonary distress. Afebrile. HEENT No facial trauma or asymetry. Sinuses non tender.  Extra occullar muscles intact.. External ears normal, . Neck: supple, no adenopathy,JVD or thyromegaly.No bruits.  Chest: Clear to ascultation bilaterally.No crackles or wheezes. Non tender to palpation  Cardiovascular system; Heart sounds normal,  S1 and  S2 ,no S3.  No murmur, or thrill. Apical beat not displaced Peripheral pulses normal.  Abdomen: Soft, non tender, no organomegaly or masses. No bruits. Bowel sounds normal. No guarding, tenderness or rebound.      Musculoskeletal exam: Adequate though reduced  ROM of spine, hips , shoulders and knees.  deformity ,swelling and crepitus noted. No muscle wasting or atrophy.   Neurologic: Cranial nerves 2 to 12 intact. Power, tone ,sensation and reflexes normal throughout. No disturbance in gait. No tremor.  Skin: Intact, no ulceration, erythema , scaling or rash noted. Pigmentation normal throughout  Psych; Normal mood and affect. Judgement and concentration normal   Assessment & Plan:  Annual visit for general adult medical examination with abnormal findings Annual exam as documented. Counseling done  re healthy lifestyle involving commitment to 150 minutes exercise per week, heart healthy diet, and attaining healthy weight.The importance of adequate sleep also discussed. Regular seat belt use and home safety, is also discussed. Changes in health habits are decided on by the patient with goals and time frames  set  for achieving them. Immunization and cancer screening needs are specifically addressed at this visit.   Essential hypertension Uncontrolled increase losartan dose and re veal DASH diet and commitment to daily physical activity for a minimum of 30 minutes discussed and encouraged, as a part of hypertension management. The importance of attaining a healthy weight is also discussed.  BP/Weight 06/12/2021 02/20/2021 12/06/2020 10/11/2020 09/13/2020 08/16/2020 28/76/8115  Systolic BP 726 203 559 741 - 638 453  Diastolic BP 90 72 78 81 - 85 88  Wt. (Lbs) 187 188 188 190 187 187 188  BMI 35.62 34.95 34.95 35.32 34.2 34.2 34.95

## 2021-06-13 LAB — CBC
Hematocrit: 34.3 % (ref 34.0–46.6)
Hemoglobin: 11.5 g/dL (ref 11.1–15.9)
MCH: 28.2 pg (ref 26.6–33.0)
MCHC: 33.5 g/dL (ref 31.5–35.7)
MCV: 84 fL (ref 79–97)
Platelets: 309 10*3/uL (ref 150–450)
RBC: 4.08 x10E6/uL (ref 3.77–5.28)
RDW: 13.2 % (ref 11.7–15.4)
WBC: 3.8 10*3/uL (ref 3.4–10.8)

## 2021-06-16 ENCOUNTER — Encounter: Payer: Self-pay | Admitting: Family Medicine

## 2021-06-16 NOTE — Assessment & Plan Note (Signed)
Uncontrolled increase losartan dose and re veal DASH diet and commitment to daily physical activity for a minimum of 30 minutes discussed and encouraged, as a part of hypertension management. The importance of attaining a healthy weight is also discussed.  BP/Weight 06/12/2021 02/20/2021 12/06/2020 10/11/2020 09/13/2020 08/16/2020 86/16/8372  Systolic BP 902 111 552 080 - 223 361  Diastolic BP 90 72 78 81 - 85 88  Wt. (Lbs) 187 188 188 190 187 187 188  BMI 35.62 34.95 34.95 35.32 34.2 34.2 34.95

## 2021-06-21 ENCOUNTER — Telehealth: Payer: Self-pay | Admitting: Family Medicine

## 2021-06-21 NOTE — Telephone Encounter (Signed)
Pt called in about Bronchitis flaring up . Has been coughing. Wants to speak with someone about what can do and have a possible prescription sent in to help with the Bronchitis   Last appt 10/19 F/U

## 2021-06-21 NOTE — Telephone Encounter (Signed)
Pt states understanding

## 2021-07-05 ENCOUNTER — Ambulatory Visit (INDEPENDENT_AMBULATORY_CARE_PROVIDER_SITE_OTHER): Payer: PPO

## 2021-07-05 ENCOUNTER — Other Ambulatory Visit: Payer: Self-pay

## 2021-07-05 DIAGNOSIS — Z0000 Encounter for general adult medical examination without abnormal findings: Secondary | ICD-10-CM | POA: Diagnosis not present

## 2021-07-05 NOTE — Progress Notes (Signed)
Subjective:   Desiree [name] is a 75 y.o. female who presents for Medicare Annual (Subsequent) preventive examination. I connected with  [name] on 07/05/21 by a audio enabled telemedicine application and verified that I am speaking with the correct person using two identifiers.  Patient Location: Home  Provider Location: Office/Clinic  I discussed the limitations of evaluation and management by telemedicine. The patient expressed understanding and agreed to proceed.  Review of Systems    Defer to PCP Cardiac Risk Factors include: advanced age (>8men, >22 women);dyslipidemia;hypertension     Objective:    Today's Vitals   07/05/21 1508  PainSc: 0-No pain   There is no height or weight on file to calculate BMI.  Advanced Directives 07/05/2021 06/19/2020 01/09/2020 12/21/2019 12/14/2019 07/01/2018 06/16/2018  Does Patient Have a Medical Advance Directive? Yes No Yes Yes No No No  Type of Advance Directive Living will - Living will Living will - - -  Does patient want to make changes to medical advance directive? Yes (MAU/Ambulatory/Procedural Areas - Information given) - - No - Patient declined - - -  Would patient like information on creating a medical advance directive? - Yes (MAU/Ambulatory/Procedural Areas - Information given) No - Patient declined - No - Patient declined No - Patient declined No - Patient declined    Current Medications (verified) Outpatient Encounter Medications as of 07/05/2021  Medication Sig   acetaminophen (TYLENOL) 500 MG tablet Take one to  two tablets by mouth at night for pain   Ascorbic Acid (VITAMIN C) 1000 MG tablet Take 1,000 mg by mouth daily.   aspirin EC 325 MG tablet Take 1 tablet (325 mg total) by mouth daily.   betamethasone dipropionate (DIPROLENE) 0.05 % cream Apply sparingly two times daily for 10 days, then as needed, to affected areas( hairline, neck)   calcium-vitamin D (OSCAL WITH D) 500-200 MG-UNIT tablet Take 1 tablet by  mouth daily with breakfast.   carvedilol (COREG) 3.125 MG tablet TAKE 1 TABLET(3.125 MG) BY MOUTH TWICE DAILY WITH A MEAL   Cholecalciferol (VITAMIN D) 50 MCG (2000 UT) tablet Take 2,000 Units by mouth daily.    cycloSPORINE (RESTASIS) 0.05 % ophthalmic emulsion 1 drop 2 (two) times daily.   docusate sodium (COLACE) 100 MG capsule Take 100 mg by mouth daily as needed for mild constipation.   gabapentin (NEURONTIN) 100 MG capsule TAKE 2 CAPSULES BY MOUTH ONCE DAILY AT BEDTIME   guaifenesin (HUMIBID E) 400 MG TABS tablet Take 400 mg by mouth daily as needed (Mucus build up). Mucus relief   hydrochlorothiazide (HYDRODIURIL) 25 MG tablet Take 1 tablet (25 mg total) by mouth daily.   losartan (COZAAR) 50 MG tablet Take 1 tablet (50 mg total) by mouth daily.   lovastatin (MEVACOR) 40 MG tablet Take 1 tablet by mouth once daily   MAGNESIUM PO Take by mouth every other day.   montelukast (SINGULAIR) 10 MG tablet TAKE 1 TABLET BY MOUTH AT BEDTIME   Multiple Vitamin (MULTIVITAMIN) tablet Take 1 tablet by mouth daily. One a day woman's   potassium chloride (KLOR-CON) 10 MEQ tablet Take 1 tablet (10 mEq total) by mouth daily.   zinc gluconate 50 MG tablet Take 50 mg by mouth daily.   ibuprofen (ADVIL) 800 MG tablet Take one tablet by mouth at bedtime , as needed, for right knee pain. Maximum is 4 tablets per week. (Patient not taking: Reported on 07/05/2021)   [DISCONTINUED] amLODipine (NORVASC) 10 MG tablet Take 1 tablet (  10 mg total) by mouth daily.   No facility-administered encounter medications on file as of 07/05/2021.    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--  S/P  RIGHT MASTECTMOY AND RADIATION--  NO RECURRANCE   History of CVA (cerebrovascular accident) PER SCAN IN 2011   Hyperlipidemia    Hypertension    Impaired fasting glucose PER PCP  DR Moshe Cipro   WATCH DIET   Mixed urge and stress incontinence    Nocturia    Sinus drainage    Thoracic ascending aortic aneurysm LAST CHEST CT 09-02-2010--  FOLLOWED BY  CARDIOLOGIST--  DR [id number]   Vaginal wall prolapse    Past Surgical History:  Procedure Laterality Date   ABDOMINAL HYSTERECTOMY     BILATERAL CARPAL TUNNEL RELEASE  1980'S   CARDIAC CATHETERIZATION  02-01-2009  DR Heyworth   NORMAL CORONARY ARTERIES/ NORMAL LV SIZE AND FUNCTION/ AORTIC ROOT SIZE AT THE UPPER LIMIT OF NORMAL    CARPAL TUNNEL RELEASE     CERVICAL [place] SURGERY  12-12-2005  DR Vertell Limber   LEFT  C6 - C7 HERINATED / DDD/ SPONDYLOSIS   COLONOSCOPY N/A 02/10/2014   Procedure: COLONOSCOPY;  Surgeon: Danie Binder, MD;  [facility];  Service: Endoscopy;  Laterality: N/A;  8:30   CYSTOCELE REPAIR  08/02/2012   Procedure: ANTERIOR REPAIR (CYSTOCELE);  Surgeon: Ailene Rud, MD;  Location[facility];  Service: Urology;  Laterality: N/A;  [employer] Uphold Anterior Pelvic Floor Sacrospinus Repair. Anterior wall of the vagina.   ESOPHAGOGASTRODUODENOSCOPY N/A 02/10/2014   Procedure: ESOPHAGOGASTRODUODENOSCOPY (EGD);  Surgeon: Danie Binder, MD;  [facility];  Service: Endoscopy;  Laterality: N/A;   GIVENS CAPSULE STUDY N/A 03/13/2014   Procedure: GIVENS CAPSULE STUDY;  Surgeon: Danie Binder, MD;  [facility];  Service: Endoscopy;  Laterality: N/A;  7:30   JOINT REPLACEMENT N/A    [employer] 06/16/2020   NECK SURGERY  2007   RIGHT PARTIAL MASTECTOMY  11-11-2004  DR PETER YOUNG   DUCTAL CARCINOMA IN SITU RIGHT BREAST   TOTAL ABDOMINAL HYSTERECTOMY W/ BILATERAL SALPINGOOPHORECTOMY  1990   TOTAL KNEE  ARTHROPLASTY Right 12/21/2019   Procedure: RIGHT  TOTAL KNEE ARTHROPLASTY;  Surgeon: [name], MD;  Location: [facility];  Service: Orthopedics;  Laterality: Right;   TRANSTHORACIC ECHOCARDIOGRAM  03-30-2008  DR MARGARET SIMPSON   LV SIZE AND FUNCTION NORMAL/ MODERATE AORTIC ARCH DILATATION/ MILD MR   Family History  Problem Relation Age of Onset   Kidney failure Mother    Hypertension Mother    Heart attack Mother 53   Throat cancer Father    Prostate cancer Father    Lung cancer Father 28   Breast cancer Sister    Hypertension Brother        2 stents   Colon cancer Neg Hx    Colon polyps Neg Hx    Social History   Socioeconomic History   Marital status: Not on file    Spouse name: Not on file  Number of children: 4   Years of education: 12+   Highest education level: Some college, no degree  Occupational History   Occupation: Retired in 2020 from [employer]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  Tobacco Use   Smoking status: Never   Smokeless tobacco: Never  Vaping Use   Vaping Use: Never used  Substance and Sexual Activity   Alcohol use: No   Drug use: No   Sexual activity: Never  Other Topics Concern   Not on file  Social History Narrative   RETIRED IN June 2020.    Social Determinants of Health   Financial Resource Strain: Low Risk    Difficulty of Paying Living Expenses: Not hard at all  Food Insecurity: No Food Insecurity   Worried About Charity fundraiser in the Last Year: Never true   [place] in the Last Year: Never true  Transportation Needs: No Transportation Needs   Lack of Transportation (Medical): No   Lack of Transportation (Non-Medical): No  Physical Activity: Insufficiently Active   Days of Exercise per Week: 3 days   Minutes of Exercise per Session: 30 min  Stress: Not on file  Social Connections: Moderately Integrated   Frequency of Communication with Friends and Family: More than three times a week   Frequency of Social  Gatherings with Friends and Family: More than three times a week   Attends Religious Services: More than 4 times per year   Active Member of [employer] or Organizations: Yes   Attends Music therapist: More than 4 times per year   Marital Status: Divorced    Tobacco Counseling Counseling given: Not Answered   Clinical Intake:  Pre-visit preparation completed: Yes  Pain : No/denies pain Pain Score: 0-No pain     Diabetes: No  How often do you need to have someone help you when you read instructions, pamphlets, or other written materials from your doctor or pharmacy?: 1 - Never What is the last grade level you completed in school?: 12th then some college  Diabetic?no  Interpreter Needed?: No      Activities of Daily Living In your present state of health, do you have any difficulty performing the following activities: 07/05/2021  Hearing? N  Vision? N  Difficulty concentrating or making decisions? N  Walking or climbing stairs? N  Dressing or bathing? N  Doing errands, shopping? N  Preparing Food and eating ? N  Using the Toilet? N  In the past six months, have you accidently leaked urine? Y  Do you have problems with loss of bowel control? N  Managing your Medications? N  Managing your Finances? N  Housekeeping or managing your Housekeeping? N  Some recent data might be hidden    Patient Care Team: [name], MD as PCP - General Croitoru, Dani Gobble, MD as PCP - Cardiology (Cardiology) Grace Isaac, MD (Inactive) as Consulting Physician (Cardiothoracic Surgery) [name], MD as Consulting Physician (Cardiology) Hortencia Pilar, MD as Consulting Physician (Ophthalmology) Carolan Clines, MD (Inactive) as Consulting Physician (Urology)  Indicate any recent Medical Services you may have received from other than [facility] providers in the past year (date may be approximate).     Assessment:   This is a routine wellness examination  for Myan.  Hearing/Vision screen No results found.  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  Pt states she would be interested in a dietician      Increase physical activity        Pt walks a mile 3 times a week and more if time allows.      COMPLETED: Weight (lb) < 175 lb (79.4 kg)        Starting 08/05/2016 patient would like to maintain her current exercise routine and slowly increase to 40 minutes 3 times a week.      Depression Screen PHQ 2/9 Scores 07/05/2021 06/12/2021 12/06/2020 10/11/2020 06/19/2020 06/19/2020 02/20/2020  PHQ - 2 Score 0 0 1 0 0 0 0  PHQ- 9 Score 0 - - - - - -    Fall Risk Fall Risk  07/05/2021 06/12/2021 12/06/2020 10/11/2020 08/16/2020  Falls in the past year? 0 0 0 0 0  Number falls in past yr: 0 0 0 0 0  Injury with Fall? 0 0 0 0 0  Risk for fall due to : No Fall Risks - No Fall Risks No Fall Risks -  Follow up Falls evaluation completed - Falls evaluation completed Falls evaluation completed -    FALL RISK PREVENTION PERTAINING TO THE HOME:  Any stairs in or around the home? Yes  If so, are there any without handrails? Yes  Home free of loose throw rugs in walkways, pet beds, electrical cords, etc? No  Adequate lighting in your home to reduce risk of falls? Yes   ASSISTIVE DEVICES UTILIZED TO PREVENT FALLS:  Life alert? No  Use of a cane, Cupps or w/c? No  Grab bars  in the bathroom? No  Shower chair or bench in shower? No  Elevated toilet seat or a handicapped toilet? No     Cognitive Function: MMSE - Mini Mental State Exam 09/21/2014  Orientation to time 5  Orientation to Place 5  Registration 3  Attention/ Calculation 4  Recall 3  Language- name 2 objects 2  Language- repeat 1  Language- follow 3 step command 2  Language- read & follow direction 1  Write a sentence 1  Copy design 1  Total score 28     6CIT Screen 07/05/2021 06/19/2020 06/16/2019 06/14/2018 06/10/2017  What Year? 0 points 0 points 0 points 0 points 0 points  What month? 0 points 0 points 0 points 0 points 0 points  What time? 0 points 0 points 0 points 0 points 0 points  Count back from 20 0 points 0 points 0 points 0 points 0 points  Months in reverse 0 points 0 points 0 points 0 points 0 points  Repeat phrase 10 points 0 points 0 points 2 points 0 points  Total Score 10 0 0 2 0    Immunizations Immunization History  Administered Date(s) Administered   Fluad Quad(high Dose 65+) 04/25/2019, 06/12/2021   Influenza Split 07/15/2011, 06/30/2012   Influenza Whole 06/13/2010   Influenza,inj,Quad PF,6+  Mos 07/13/2013, 05/22/2014, 06/12/2015, 07/08/2016, 05/27/2017, 05/05/2018, 04/20/2020   Moderna Sars-Covid-2 Vaccination 10/06/2019, 11/07/2019, 07/05/2020, 03/18/2021   Pneumococcal Conjugate-13 09/21/2014   Pneumococcal Polysaccharide-23 11/11/2011   Tdap 07/15/2011   Zoster, Live 11/09/2006    TDAP status: Up to date  Flu Vaccine status: Up to date  Pneumococcal vaccine status: Up to date  Covid-19 vaccine status: Information provided on how to obtain vaccines.   Qualifies for Shingles Vaccine? Yes   Zostavax completed No   Shingrix Completed?: No.    Education has been provided regarding the importance of this vaccine. Patient has been advised to call insurance company to determine out of pocket expense if they have not yet received this vaccine. Advised may  also receive vaccine at local pharmacy or Health Dept. Verbalized acceptance and understanding.  Screening Tests Health Maintenance  Topic Date Due   Zoster Vaccines- Shingrix (1 of 2) Never done   COVID-19 Vaccine (5 - Booster for Moderna series) 05/13/2021   TETANUS/TDAP  07/14/2021   MAMMOGRAM  03/05/2023   COLONOSCOPY (Pts 45-55yrs Insurance coverage will need to be confirmed)  02/13/2024   Pneumonia Vaccine 57+ Years old  Completed   INFLUENZA VACCINE  Completed   DEXA SCAN  Completed   Hepatitis C Screening  Completed   HPV VACCINES  Aged Out    Health Maintenance  Health Maintenance Due  Topic Date Due   Zoster Vaccines- Shingrix (1 of 2) Never done   COVID-19 Vaccine (5 - Booster for Moderna series) 05/13/2021    Colorectal cancer screening: Type of screening: Colonoscopy. Completed 02/12/2014. Repeat every 0 years  Mammogram status: Completed 03/04/2021. Repeat every year  Bone Density status: Completed 09/14/2020. Results reflect: Bone density results: OSTEOPENIA. Repeat every 2 years.  Lung Cancer Screening: (Low Dose CT Chest recommended if Age 56-80 years, 30 pack-year currently smoking OR have quit w/in 15years.) does not qualify.   Lung Cancer Screening Referral: n/a  Additional Screening:  Hepatitis C Screening: does qualify; Completed 02/12/2017  Vision Screening: Recommended annual ophthalmology exams for early detection of glaucoma and other disorders of the eye. Is the patient up to date with their annual eye exam?  Yes  Who is the provider or what is the name of the office in which the patient attends annual eye exams? September 2022 Dr Kathlen Mody If pt is not established with a provider, would they like to be referred to a provider to establish care?  N/a .   Dental Screening: Recommended annual dental exams for proper oral hygiene  Community Resource Referral / Chronic Care Management: CRR required this visit?  No   CCM required this visit?  No       Plan:     I have personally reviewed and noted the following in the patient's chart:   Medical and social history Use of alcohol, tobacco or illicit drugs  Current medications and supplements including opioid prescriptions.  Functional ability and status Nutritional status Physical activity Advanced directives List of other physicians Hospitalizations, surgeries, and ER visits in previous 12 months Vitals Screenings to include cognitive, depression, and falls Referrals and appointments  In addition, I have reviewed and discussed with patient certain preventive protocols, quality metrics, and best practice recommendations. A written personalized care plan for preventive services as well as general preventive health recommendations were provided to patient.     Earline [place], [place]   07/05/2021   Nurse Notes: Pt is interested in dietician or diet education.   Ms. Withington ,  Thank you for taking time to come for your Medicare Wellness Visit. I appreciate your ongoing commitment to your health goals. Please review the following plan we discussed and let me know if I can assist you in the future.   These are the goals we discussed:  Goals       Cut out extra servings (pt-stated)      I would love to talk with a dietician and learn to eat better.  Pt states she would be interested in a dietician      Increase physical activity      Pt walks a mile 3 times a week and more if time allows.        This is a list of the screening recommended for you and due dates:  Health Maintenance  Topic Date Due   Zoster (Shingles) Vaccine (1 of 2) Never done   COVID-19 Vaccine (5 - Booster for Moderna series) 05/13/2021   Tetanus Vaccine  07/14/2021   Mammogram  03/05/2023   Colon Cancer Screening  02/13/2024   Pneumonia Vaccine  Completed   Flu Shot  Completed   DEXA scan (bone density measurement)  Completed   Hepatitis C Screening: USPSTF Recommendation to screen - Ages  66-79 yo.  Completed   HPV Vaccine  Aged Out

## 2021-07-05 NOTE — Patient Instructions (Addendum)
Ms. Tietje , Thank you for taking time to come for your Medicare Wellness Visit. I appreciate your ongoing commitment to your health goals. Please review the following plan we discussed and let me know if I can assist you in the future.   These are the goals we discussed:  Goals       Cut out extra servings (pt-stated)      I would love to talk with a dietician and learn to eat better.  Pt states she would be interested in a dietician      Increase physical activity      Pt walks a mile 3 times a week and more if time allows.        This is a list of the screening recommended for you and due dates:  Health Maintenance  Topic Date Due   Zoster (Shingles) Vaccine (1 of 2) Never done   COVID-19 Vaccine (5 - Booster for Moderna series) 05/13/2021   Tetanus Vaccine  07/14/2021   Mammogram  03/05/2023   Colon Cancer Screening  02/13/2024   Pneumonia Vaccine  Completed   Flu Shot  Completed   DEXA scan (bone density measurement)  Completed   Hepatitis C Screening: USPSTF Recommendation to screen - Ages 47-79 yo.  Completed   HPV Vaccine  Aged Out     Health Maintenance, Female Adopting a healthy lifestyle and getting preventive care are important in promoting health and wellness. Ask your health care provider about: The right schedule for you to have regular tests and exams. Things you can do on your own to prevent diseases and keep yourself healthy. What should I know about diet, weight, and exercise? Eat a healthy diet  Eat a diet that includes plenty of vegetables, fruits, low-fat dairy products, and lean protein. Do not eat a lot of foods that are high in solid fats, added sugars, or sodium. Maintain a healthy weight Body mass index (BMI) is used to identify weight problems. It estimates body fat based on height and weight. Your health care provider can help determine your BMI and help you achieve or maintain a healthy weight. Get regular exercise Get regular exercise. This  is one of the most important things you can do for your health. Most adults should: Exercise for at least 150 minutes each week. The exercise should increase your heart rate and make you sweat (moderate-intensity exercise). Do strengthening exercises at least twice a week. This is in addition to the moderate-intensity exercise. Spend less time sitting. Even light physical activity can be beneficial. Watch cholesterol and blood lipids Have your blood tested for lipids and cholesterol at 74 years of age, then have this test every 5 years. Have your cholesterol levels checked more often if: Your lipid or cholesterol levels are high. You are older than 74 years of age. You are at high risk for heart disease. What should I know about cancer screening? Depending on your health history and family history, you may need to have cancer screening at various ages. This may include screening for: Breast cancer. Cervical cancer. Colorectal cancer. Skin cancer. Lung cancer. What should I know about heart disease, diabetes, and high blood pressure? Blood pressure and heart disease High blood pressure causes heart disease and increases the risk of stroke. This is more likely to develop in people who have high blood pressure readings or are overweight. Have your blood pressure checked: Every 3-5 years if you are 37-72 years of age. Every year if you  are 41 years old or older. Diabetes Have regular diabetes screenings. This checks your fasting blood sugar level. Have the screening done: Once every three years after age 53 if you are at a normal weight and have a low risk for diabetes. More often and at a younger age if you are overweight or have a high risk for diabetes. What should I know about preventing infection? Hepatitis B If you have a higher risk for hepatitis B, you should be screened for this virus. Talk with your health care provider to find out if you are at risk for hepatitis B  infection. Hepatitis C Testing is recommended for: Everyone born from [birth year] through 1965. Anyone with known risk factors for hepatitis C. Sexually transmitted infections (STIs) Get screened for STIs, including gonorrhea and chlamydia, if: You are sexually active and are younger than 75 years of age. You are older than 74 years of age and your health care provider tells you that you are at risk for this type of infection. Your sexual activity has changed since you were last screened, and you are at increased risk for chlamydia or gonorrhea. Ask your health care provider if you are at risk. Ask your health care provider about whether you are at high risk for HIV. Your health care provider may recommend a prescription medicine to help prevent HIV infection. If you choose to take medicine to prevent HIV, you should first get tested for HIV. You should then be tested every 3 months for as long as you are taking the medicine. Pregnancy If you are about to stop having your period (premenopausal) and you may become pregnant, seek counseling before you get pregnant. Take 400 to 800 micrograms (mcg) of folic acid every day if you become pregnant. Ask for birth control (contraception) if you want to prevent pregnancy. Osteoporosis and menopause Osteoporosis is a disease in which the bones lose minerals and strength with aging. This can result in bone fractures. If you are 34 years old or older, or if you are at risk for osteoporosis and fractures, ask your health care provider if you should: Be screened for bone loss. Take a calcium or vitamin D supplement to lower your risk of fractures. Be given hormone replacement therapy (HRT) to treat symptoms of menopause. Follow these instructions at home: Alcohol use Do not drink alcohol if: Your health care provider tells you not to drink. You are pregnant, may be pregnant, or are planning to become pregnant. If you drink alcohol: Limit how much you have  to: 0-1 drink a day. Know how much alcohol is in your drink. In the [place] one drink equals one 12 oz bottle of beer (355 mL), one 5 oz glass of wine (148 mL), or one 1 oz glass of hard liquor (44 mL). Lifestyle Do not use any products that contain nicotine or tobacco. These products include cigarettes, chewing tobacco, and vaping devices, such as e-cigarettes. If you need help quitting, ask your health care provider. Do not use street drugs. Do not share needles. Ask your health care provider for help if you need support or information about quitting drugs. General instructions Schedule regular health, dental, and eye exams. Stay current with your vaccines. Tell your health care provider if: You often feel depressed. You have ever been abused or do not feel safe at home. Summary Adopting a healthy lifestyle and getting preventive care are important in promoting health and wellness. Follow your health care provider's instructions about healthy diet, exercising,  and getting tested or screened for diseases. Follow your health care provider's instructions on monitoring your cholesterol and blood pressure. This information is not intended to replace advice given to you by your health care provider. Make sure you discuss any questions you have with your health care provider. Document Revised: 12/31/2020 Document Reviewed: 12/31/2020 Elsevier Patient Education  [place] Maintenance, Female Adopting a healthy lifestyle and getting preventive care are important in promoting health and wellness. Ask your health care provider about: The right schedule for you to have regular tests and exams. Things you can do on your own to prevent diseases and keep yourself healthy. What should I know about diet, weight, and exercise? Eat a healthy diet  Eat a diet that includes plenty of vegetables, fruits, low-fat dairy products, and lean protein. Do not eat a lot of foods that are high in  solid fats, added sugars, or sodium. Maintain a healthy weight Body mass index (BMI) is used to identify weight problems. It estimates body fat based on height and weight. Your health care provider can help determine your BMI and help you achieve or maintain a healthy weight. Get regular exercise Get regular exercise. This is one of the most important things you can do for your health. Most adults should: Exercise for at least 150 minutes each week. The exercise should increase your heart rate and make you sweat (moderate-intensity exercise). Do strengthening exercises at least twice a week. This is in addition to the moderate-intensity exercise. Spend less time sitting. Even light physical activity can be beneficial. Watch cholesterol and blood lipids Have your blood tested for lipids and cholesterol at 74 years of age, then have this test every 5 years. Have your cholesterol levels checked more often if: Your lipid or cholesterol levels are high. You are older than 74 years of age. You are at high risk for heart disease. What should I know about cancer screening? Depending on your health history and family history, you may need to have cancer screening at various ages. This may include screening for: Breast cancer. Cervical cancer. Colorectal cancer. Skin cancer. Lung cancer. What should I know about heart disease, diabetes, and high blood pressure? Blood pressure and heart disease High blood pressure causes heart disease and increases the risk of stroke. This is more likely to develop in people who have high blood pressure readings or are overweight. Have your blood pressure checked: Every 3-5 years if you are 39-32 years of age. Every year if you are 60 years old or older. Diabetes Have regular diabetes screenings. This checks your fasting blood sugar level. Have the screening done: Once every three years after age 73 if you are at a normal weight and have a low risk for  diabetes. More often and at a younger age if you are overweight or have a high risk for diabetes. What should I know about preventing infection? Hepatitis B If you have a higher risk for hepatitis B, you should be screened for this virus. Talk with your health care provider to find out if you are at risk for hepatitis B infection. Hepatitis C Testing is recommended for: Everyone born from [birth year] through 1965. Anyone with known risk factors for hepatitis C. Sexually transmitted infections (STIs) Get screened for STIs, including gonorrhea and chlamydia, if: You are sexually active and are younger than 74 years of age. You are older than 74 years of age and your health care provider tells you that you are  at risk for this type of infection. Your sexual activity has changed since you were last screened, and you are at increased risk for chlamydia or gonorrhea. Ask your health care provider if you are at risk. Ask your health care provider about whether you are at high risk for HIV. Your health care provider may recommend a prescription medicine to help prevent HIV infection. If you choose to take medicine to prevent HIV, you should first get tested for HIV. You should then be tested every 3 months for as long as you are taking the medicine. Pregnancy If you are about to stop having your period (premenopausal) and you may become pregnant, seek counseling before you get pregnant. Take 400 to 800 micrograms (mcg) of folic acid every day if you become pregnant. Ask for birth control (contraception) if you want to prevent pregnancy. Osteoporosis and menopause Osteoporosis is a disease in which the bones lose minerals and strength with aging. This can result in bone fractures. If you are 15 years old or older, or if you are at risk for osteoporosis and fractures, ask your health care provider if you should: Be screened for bone loss. Take a calcium or vitamin D supplement to lower your risk of  fractures. Be given hormone replacement therapy (HRT) to treat symptoms of menopause. Follow these instructions at home: Alcohol use Do not drink alcohol if: Your health care provider tells you not to drink. You are pregnant, may be pregnant, or are planning to become pregnant. If you drink alcohol: Limit how much you have to: 0-1 drink a day. Know how much alcohol is in your drink. In the [place] one drink equals one 12 oz bottle of beer (355 mL), one 5 oz glass of wine (148 mL), or one 1 oz glass of hard liquor (44 mL). Lifestyle Do not use any products that contain nicotine or tobacco. These products include cigarettes, chewing tobacco, and vaping devices, such as e-cigarettes. If you need help quitting, ask your health care provider. Do not use street drugs. Do not share needles. Ask your health care provider for help if you need support or information about quitting drugs. General instructions Schedule regular health, dental, and eye exams. Stay current with your vaccines. Tell your health care provider if: You often feel depressed. You have ever been abused or do not feel safe at home. Summary Adopting a healthy lifestyle and getting preventive care are important in promoting health and wellness. Follow your health care provider's instructions about healthy diet, exercising, and getting tested or screened for diseases. Follow your health care provider's instructions on monitoring your cholesterol and blood pressure. This information is not intended to replace advice given to you by your health care provider. Make sure you discuss any questions you have with your health care provider. Document Revised: 12/31/2020 Document Reviewed: 12/31/2020 Elsevier Patient Education  [place].

## 2021-07-09 ENCOUNTER — Other Ambulatory Visit: Payer: Self-pay | Admitting: *Deleted

## 2021-07-09 DIAGNOSIS — I712 Thoracic aortic aneurysm, without rupture, unspecified: Secondary | ICD-10-CM

## 2021-07-10 ENCOUNTER — Encounter: Payer: Self-pay | Admitting: Family Medicine

## 2021-07-10 ENCOUNTER — Other Ambulatory Visit: Payer: Self-pay

## 2021-07-10 ENCOUNTER — Ambulatory Visit (INDEPENDENT_AMBULATORY_CARE_PROVIDER_SITE_OTHER): Payer: PPO | Admitting: Family Medicine

## 2021-07-10 VITALS — BP 148/82 | HR 63 | Resp 17 | Ht 61.5 in | Wt 188.1 lb

## 2021-07-10 DIAGNOSIS — J309 Allergic rhinitis, unspecified: Secondary | ICD-10-CM

## 2021-07-10 DIAGNOSIS — I10 Essential (primary) hypertension: Secondary | ICD-10-CM

## 2021-07-10 DIAGNOSIS — E785 Hyperlipidemia, unspecified: Secondary | ICD-10-CM | POA: Diagnosis not present

## 2021-07-10 DIAGNOSIS — R42 Dizziness and giddiness: Secondary | ICD-10-CM | POA: Insufficient documentation

## 2021-07-10 DIAGNOSIS — L049 Acute lymphadenitis, unspecified: Secondary | ICD-10-CM | POA: Insufficient documentation

## 2021-07-10 MED ORDER — MECLIZINE HCL 25 MG PO TABS: 25.0000 mg | ORAL_TABLET | Freq: Three times a day (TID) | ORAL | 0 refills | Status: DC | PRN

## 2021-07-10 MED ORDER — SULFAMETHOXAZOLE-TRIMETHOPRIM 800-160 MG PO TABS: 1.0000 | ORAL_TABLET | Freq: Two times a day (BID) | ORAL | 0 refills | Status: DC

## 2021-07-10 MED ORDER — LOSARTAN POTASSIUM 50 MG PO TABS: ORAL_TABLET | ORAL | 1 refills | Status: DC

## 2021-07-10 NOTE — Progress Notes (Signed)
   Desiree [name]     MRN: [id number]      DOB: October 30, 1945   HPI Desiree [name] is here .  Dizzy when bending over x 5 days, gradually improving, also notes tender swelling unfer left chin and feeling of fullness on left side of face and jaw x 5 days. No fever, chills, increased sinus drainage or cough  ROS Denies chest pains, palpitations and leg swelling Denies abdominal pain, nausea, vomiting,diarrhea or constipation.   Denies dysuria, frequency, hesitancy or incontinence. Denies joint pain, swelling and limitation in mobility. Denies headaches, seizures, numbness, or tingling. Denies depression, anxiety or insomnia. Denies skin break down or rash.   PE  BP 137/83   Pulse 63   Resp 17   Ht 5' 1.5" (1.562 m)   Wt 188 lb 1.3 oz (85.3 kg)   SpO2 96%   BMI 34.96 kg/m   Patient alert and oriented and in no cardiopulmonary distress.  HEENT: No facial asymmetry, EOMI,     Neck supple . Tender enlarged submental left node, no nystagmus Chest: Clear to auscultation bilaterally.  CVS: S1, S2 no murmurs, no S3.Regular rate.  ABD: Soft non tender.   Ext: No edema  MS: Adequate though reduced  ROM spine, shoulders, hips and knees.  Skin: Intact, no ulcerations or rash noted.  Psych: Good eye contact, normal affect. Memory intact not anxious or depressed appearing.  CNS: CN 2-12 intact, power,  normal throughout.no focal deficits noted.   Assessment & Plan  Adenitis, acute Septra ande naproxen prescribed  Essential hypertension Uncontrolled , increase dose of cozaar DASH diet and commitment to daily physical activity for a minimum of 30 minutes discussed and encouraged, as a part of hypertension management. The importance of attaining a healthy weight is also discussed.  BP/Weight 07/10/2021 06/12/2021 02/20/2021 12/06/2020 10/11/2020 09/13/2020 34/19/3790  Systolic BP 240 973 532 992 426 - 834  Diastolic BP 82 90 72 78 81 - 85  Wt. (Lbs) 188.08 187 188 188 190 187 187   BMI 34.96 35.62 34.95 34.95 35.32 34.2 34.2       Vertigo Antivert, as needed  Hyperlipidemia Hyperlipidemia:Low fat diet discussed and encouraged.   Lipid Panel  Lab Results  Component Value Date   CHOL 181 11/29/2020   HDL 92 11/29/2020   LDLCALC 80 11/29/2020   TRIG 43 11/29/2020   CHOLHDL 2.0 11/29/2020     Controlled, no change in medication   Allergic rhinitis Controlled, no change in medication

## 2021-07-10 NOTE — Patient Instructions (Addendum)
Please reschedule December follow up to mid January, call if you need me sooner. Re evaluate BP at that visit  New higher dose of losartan , 50 mg one and a half tablets once daily, other meds as before  For swollen tender gland under your chin (adenitis) septra and naproxen are prescribed  Fasting lipid, cmp and eGFr 3 days before next appointment  For vertigo, meclizine is prescribed as needed  It is important that you exercise regularly at least 30 minutes 5 times a week. If you develop chest pain, have severe difficulty breathing, or feel very tired, stop exercising immediately and seek medical attention  Think about what you will eat, plan ahead. Choose " clean, green, fresh or frozen" over canned, processed or packaged foods which are more sugary, salty and fatty. 70 to 75% of food eaten should be vegetables and fruit. Three meals at set times with snacks allowed between meals, but they must be fruit or vegetables. Aim to eat over a 12 hour period , example 7 am to 7 pm, and STOP after  your last meal of the day. Drink water,generally about 64 ounces per day, no other drink is as healthy. Fruit juice is best enjoyed in a healthy way, by EATING the fruit. Thanks for choosing [facility], we consider it a privelige to serve you.

## 2021-07-12 ENCOUNTER — Encounter: Payer: Self-pay | Admitting: Family Medicine

## 2021-07-12 NOTE — Assessment & Plan Note (Signed)
Septra ande naproxen prescribed

## 2021-07-12 NOTE — Assessment & Plan Note (Signed)
Hyperlipidemia:Low fat diet discussed and encouraged.   Lipid Panel  Lab Results  Component Value Date   CHOL 181 11/29/2020   HDL 92 11/29/2020   LDLCALC 80 11/29/2020   TRIG 43 11/29/2020   CHOLHDL 2.0 11/29/2020     Controlled, no change in medication

## 2021-07-12 NOTE — Assessment & Plan Note (Signed)
Antivert, as needed

## 2021-07-12 NOTE — Assessment & Plan Note (Signed)
Controlled, no change in medication  

## 2021-07-12 NOTE — Assessment & Plan Note (Signed)
Uncontrolled , increase dose of cozaar DASH diet and commitment to daily physical activity for a minimum of 30 minutes discussed and encouraged, as a part of hypertension management. The importance of attaining a healthy weight is also discussed.  BP/Weight 07/10/2021 06/12/2021 02/20/2021 12/06/2020 10/11/2020 09/13/2020 07/37/1062  Systolic BP 694 854 627 035 009 - 381  Diastolic BP 82 90 72 78 81 - 85  Wt. (Lbs) 188.08 187 188 188 190 187 187  BMI 34.96 35.62 34.95 34.95 35.32 34.2 34.2

## 2021-08-06 DIAGNOSIS — H1045 Other chronic allergic conjunctivitis: Secondary | ICD-10-CM | POA: Diagnosis not present

## 2021-08-06 DIAGNOSIS — B309 Viral conjunctivitis, unspecified: Secondary | ICD-10-CM | POA: Diagnosis not present

## 2021-08-06 DIAGNOSIS — H04123 Dry eye syndrome of bilateral lacrimal glands: Secondary | ICD-10-CM | POA: Diagnosis not present

## 2021-08-06 DIAGNOSIS — H40033 Anatomical narrow angle, bilateral: Secondary | ICD-10-CM | POA: Diagnosis not present

## 2021-08-14 ENCOUNTER — Ambulatory Visit: Payer: PPO | Admitting: Family Medicine

## 2021-08-20 ENCOUNTER — Ambulatory Visit: Payer: PPO

## 2021-08-20 ENCOUNTER — Other Ambulatory Visit: Payer: PPO

## 2021-08-20 ENCOUNTER — Other Ambulatory Visit: Payer: Self-pay | Admitting: Family Medicine

## 2021-09-03 DIAGNOSIS — H40032 Anatomical narrow angle, left eye: Secondary | ICD-10-CM | POA: Diagnosis not present

## 2021-09-10 ENCOUNTER — Other Ambulatory Visit: Payer: Self-pay

## 2021-09-10 ENCOUNTER — Ambulatory Visit (INDEPENDENT_AMBULATORY_CARE_PROVIDER_SITE_OTHER): Payer: PPO | Admitting: Family Medicine

## 2021-09-10 ENCOUNTER — Encounter: Payer: Self-pay | Admitting: Family Medicine

## 2021-09-10 VITALS — BP 129/76 | HR 98 | Ht 61.0 in | Wt 188.0 lb

## 2021-09-10 DIAGNOSIS — K047 Periapical abscess without sinus: Secondary | ICD-10-CM | POA: Diagnosis not present

## 2021-09-10 DIAGNOSIS — E785 Hyperlipidemia, unspecified: Secondary | ICD-10-CM

## 2021-09-10 DIAGNOSIS — I10 Essential (primary) hypertension: Secondary | ICD-10-CM | POA: Diagnosis not present

## 2021-09-10 NOTE — Patient Instructions (Signed)
F/U in 6 months, call if you need me sooner  Excellent blood pressure , no medication changes   All the best with your dental work  Check pharmacy for your TdAP vaccine it is past due  It is important that you exercise regularly at least 30 minutes 5 times a week. If you develop chest pain, have severe difficulty breathing, or feel very tired, stop exercising immediately and seek medical attention   Think about what you will eat, plan ahead. Choose " clean, green, fresh or frozen" over canned, processed or packaged foods which are more sugary, salty and fatty. 70 to 75% of food eaten should be vegetables and fruit. Three meals at set times with snacks allowed between meals, but they must be fruit or vegetables. Aim to eat over a 12 hour period , example 7 am to 7 pm, and STOP after  your last meal of the day. Drink water,generally about 64 ounces per day, no other drink is as healthy. Fruit juice is best enjoyed in a healthy way, by EATING the fruit.  Thanks for choosing [facility], we consider it a privelige to serve you.

## 2021-09-11 ENCOUNTER — Encounter: Payer: Self-pay | Admitting: Family Medicine

## 2021-09-11 DIAGNOSIS — K047 Periapical abscess without sinus: Secondary | ICD-10-CM | POA: Insufficient documentation

## 2021-09-11 NOTE — Progress Notes (Signed)
° °  Desiree [name]     MRN: [id number]      DOB: 11/30/1945   HPI Desiree [name] is here for follow up and re-evaluation of chronic medical conditions, specifically uncontrolled HTN, medication management and review of any available recent lab and radiology data.  Preventive health is updated, specifically  Cancer screening and Immunization.   Questions or concerns regarding consultations or procedures which the PT has had in the interim are  addressed. The PT denies any adverse reactions to current medications since the last visit.  New dx of infected tooth, extraction planned, currently on amoxicillin, pain much improved   ROS Denies recent fever or chills. Denies sinus pressure, nasal congestion, ear pain or sore throat. Denies chest congestion, productive cough or wheezing. Denies chest pains, palpitations and leg swelling Dor tingling. Denies depression, anxiety or insomnia. Denies skin break down or rash.   PE  BP 129/76    Pulse 98    Ht 5\' 1"  (1.549 m)    Wt 188 lb 0.6 oz (85.3 kg)    SpO2 97%    BMI 35.53 kg/m   Patient alert and oriented and in no cardiopulmonary distress.Pt uncomfortable, in pain  HEENT: mild left facial swelling, EOMI,     Neck supple .left cervical adenitis  Chest: Clear to auscultation bilaterally.  CVS: S1, S2 no murmurs, no S3.Regular rate.     Ext: No edema  MS: Adequate ROM spine, shoulders, hips and knees.  Skin: Intact, no ulcerations or rash noted.  Psych: Good eye contact, normal affect. Memory intact not anxious or depressed appearing.  CNS: CN 2-12 intact, power,  normal throughout.no focal deficits noted.   Assessment & Plan  Essential hypertension Controlled, no change in medication DASH diet and commitment to daily physical activity for a minimum of 30 minutes discussed and encouraged, as a part of hypertension management. The importance of attaining a healthy weight is also discussed.  BP/Weight 09/10/2021 07/10/2021  06/12/2021 02/20/2021 12/06/2020 10/11/2020 09/26/5425  Systolic BP 062 376 283 151 761 607 -  Diastolic BP 76 82 90 72 78 81 -  Wt. (Lbs) 188.04 188.08 187 188 188 190 187  BMI 35.53 34.96 35.62 34.95 34.95 35.32 34.2       Infected tooth under dental care , extraction planned, continue antibiotic per dentist  Morbid obesity ([place]) Obesity linked with HTN, hyperlipidemia, arthritis  Patient re-educated about  the importance of commitment to a  minimum of 150 minutes of exercise per week as able.  The importance of healthy food choices with portion control discussed, as well as eating regularly and within a 12 hour window most days. The need to choose "clean , green" food 50 to 75% of the time is discussed, as well as to make water the primary drink and set a goal of 64 ounces water daily.    Weight /BMI 09/10/2021 07/10/2021 06/12/2021  WEIGHT 188 lb 0.6 oz 188 lb 1.3 oz 187 lb  HEIGHT 5\' 1"  5' 1.5" 5' .75"  BMI 35.53 kg/m2 34.96 kg/m2 35.62 kg/m2      Hyperlipidemia Hyperlipidemia:Low fat diet discussed and encouraged.   Lipid Panel  Lab Results  Component Value Date   CHOL 181 11/29/2020   HDL 92 11/29/2020   LDLCALC 80 11/29/2020   TRIG 43 11/29/2020   CHOLHDL 2.0 11/29/2020     Updated lab needed at/ before next visit.

## 2021-09-11 NOTE — Assessment & Plan Note (Signed)
Controlled, no change in medication DASH diet and commitment to daily physical activity for a minimum of 30 minutes discussed and encouraged, as a part of hypertension management. The importance of attaining a healthy weight is also discussed.  BP/Weight 09/10/2021 07/10/2021 06/12/2021 02/20/2021 12/06/2020 10/11/2020 9/38/1829  Systolic BP 937 169 678 938 101 751 -  Diastolic BP 76 82 90 72 78 81 -  Wt. (Lbs) 188.04 188.08 187 188 188 190 187  BMI 35.53 34.96 35.62 34.95 34.95 35.32 34.2

## 2021-09-11 NOTE — Assessment & Plan Note (Signed)
Obesity linked with HTN, hyperlipidemia, arthritis  Patient re-educated about  the importance of commitment to a  minimum of 150 minutes of exercise per week as able.  The importance of healthy food choices with portion control discussed, as well as eating regularly and within a 12 hour window most days. The need to choose "clean , green" food 50 to 75% of the time is discussed, as well as to make water the primary drink and set a goal of 64 ounces water daily.    Weight /BMI 09/10/2021 07/10/2021 06/12/2021  WEIGHT 188 lb 0.6 oz 188 lb 1.3 oz 187 lb  HEIGHT 5\' 1"  5' 1.5" 5' .75"  BMI 35.53 kg/m2 34.96 kg/m2 35.62 kg/m2

## 2021-09-11 NOTE — Assessment & Plan Note (Signed)
Hyperlipidemia:Low fat diet discussed and encouraged.   Lipid Panel  Lab Results  Component Value Date   CHOL 181 11/29/2020   HDL 92 11/29/2020   LDLCALC 80 11/29/2020   TRIG 43 11/29/2020   CHOLHDL 2.0 11/29/2020     Updated lab needed at/ before next visit.

## 2021-09-11 NOTE — Assessment & Plan Note (Signed)
under dental care , extraction planned, continue antibiotic per dentist

## 2021-09-12 ENCOUNTER — Ambulatory Visit: Payer: PPO | Admitting: Family Medicine

## 2021-09-16 DIAGNOSIS — H40031 Anatomical narrow angle, right eye: Secondary | ICD-10-CM | POA: Diagnosis not present

## 2021-09-18 DIAGNOSIS — I10 Essential (primary) hypertension: Secondary | ICD-10-CM | POA: Diagnosis not present

## 2021-09-18 DIAGNOSIS — E785 Hyperlipidemia, unspecified: Secondary | ICD-10-CM | POA: Diagnosis not present

## 2021-09-19 LAB — LIPID PANEL
Chol/HDL Ratio: 2.3 ratio (ref 0.0–4.4)
Cholesterol, Total: 176 mg/dL (ref 100–199)
HDL: 76 mg/dL (ref >39)
LDL Chol Calc (NIH): 91 mg/dL (ref 0–99)
Triglycerides: 44 mg/dL (ref 0–149)
VLDL Cholesterol Cal: 9 mg/dL (ref 5–40)

## 2021-09-19 LAB — CMP14+EGFR
ALT: 10 IU/L (ref 0–32)
AST: 19 IU/L (ref 0–40)
Albumin/Globulin Ratio: 1.6 (ref 1.2–2.2)
Albumin: 4.4 g/dL (ref 3.7–4.7)
Alkaline Phosphatase: 96 IU/L (ref 44–121)
BUN/Creatinine Ratio: 26 (ref 12–28)
BUN: 29 mg/dL — ABNORMAL HIGH (ref 8–27)
Bilirubin Total: 0.4 mg/dL (ref 0.0–1.2)
CO2: 27 mmol/L (ref 20–29)
Calcium: 9.8 mg/dL (ref 8.7–10.3)
Chloride: 101 mmol/L (ref 96–106)
Creatinine, Ser: 1.13 mg/dL — ABNORMAL HIGH (ref 0.57–1.00)
Globulin, Total: 2.8 g/dL (ref 1.5–4.5)
Glucose: 97 mg/dL (ref 70–99)
Potassium: 4.4 mmol/L (ref 3.5–5.2)
Sodium: 146 mmol/L — ABNORMAL HIGH (ref 134–144)
Total Protein: 7.2 g/dL (ref 6.0–8.5)
eGFR: 51 mL/min/{1.73_m2} — ABNORMAL LOW (ref >59)

## 2021-10-01 ENCOUNTER — Ambulatory Visit (INDEPENDENT_AMBULATORY_CARE_PROVIDER_SITE_OTHER): Payer: PPO | Admitting: Surgical

## 2021-10-01 ENCOUNTER — Ambulatory Visit
Admission: RE | Admit: 2021-10-01 | Discharge: 2021-10-01 | Disposition: A | Discharge status: Home / Self Care | Payer: PPO | Source: Ambulatory Visit | Attending: Surgery | Admitting: Surgery

## 2021-10-01 ENCOUNTER — Other Ambulatory Visit: Payer: Self-pay

## 2021-10-01 VITALS — BP 152/88 | HR 61 | Resp 20 | Ht 61.0 in | Wt 187.0 lb

## 2021-10-01 DIAGNOSIS — I712 Thoracic aortic aneurysm, without rupture, unspecified: Secondary | ICD-10-CM

## 2021-10-01 DIAGNOSIS — I7121 Aneurysm of the ascending aorta, without rupture: Secondary | ICD-10-CM

## 2021-10-01 MED ORDER — IOPAMIDOL (ISOVUE-370) INJECTION 76%
75.0000 mL | Freq: Once | INTRAVENOUS | Status: AC | PRN
  Administered 2021-10-01: 75 mL via INTRAVENOUS

## 2021-10-01 NOTE — Patient Instructions (Signed)
Discussed activity and lifestyle management including nutrition

## 2021-10-01 NOTE — Progress Notes (Signed)
Subjective:    Patient ID: Desiree [name], female    DOB: 1946/01/05, 76 y.o.   MRN: [id number]  Chief Complaint  Patient presents with   Thoracic Aortic Aneurysm    Yearly f/u with CTA chest    HPI Patient is in today for ongoing surveillance of her ascending aortic thoracic aneurysm.  She remains asymptomatic in this regard.  She denies specific chest pain or back pain.  She does not have palpitations.  She does not have DOE or lower extremity edema.  She states she has been followed since 2009.  She is noted to have a elevated systolic blood pressure today in the 150s but reports that when she checks her blood pressure it is usually much more in the normal range.  She is on multiple agents for blood pressure and also on meds for hypercholesterolemia.  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  RIGHT MASTECTMOY AND RADIATION--  NO RECURRANCE   History of CVA (cerebrovascular accident) PER SCAN IN 2011   Hyperlipidemia    Hypertension    Impaired fasting glucose PER PCP  DR Moshe Cipro   WATCH DIET   Mixed urge and stress incontinence    Nocturia    Sinus drainage    Thoracic ascending aortic aneurysm LAST CHEST CT 09-02-2010--  FOLLOWED BY  CARDIOLOGIST--  DR [id number]   Vaginal wall prolapse     Past Surgical History:  Procedure Laterality Date   ABDOMINAL HYSTERECTOMY     BILATERAL CARPAL TUNNEL RELEASE  1980'S   CARDIAC CATHETERIZATION  02-01-2009  DR Hudson   NORMAL CORONARY ARTERIES/ NORMAL LV SIZE AND FUNCTION/ AORTIC ROOT SIZE AT THE UPPER LIMIT OF NORMAL    CARPAL TUNNEL RELEASE     CERVICAL [place] SURGERY  12-12-2005  DR  Vertell Limber   LEFT  C6 - C7 HERINATED / DDD/ SPONDYLOSIS   COLONOSCOPY N/A 02/10/2014   Procedure: COLONOSCOPY;  Surgeon: Danie Binder, MD;  Location: [facility];  Service: Endoscopy;  Laterality: N/A;  8:30   CYSTOCELE REPAIR  08/02/2012   Procedure: ANTERIOR REPAIR (CYSTOCELE);  Surgeon: Ailene Rud, MD;  Location[facility];  Service: Urology;  Laterality: N/A;  [employer] Uphold Anterior Pelvic Floor Sacrospinus Repair. Anterior wall of the vagina.   ESOPHAGOGASTRODUODENOSCOPY N/A 02/10/2014   Procedure: ESOPHAGOGASTRODUODENOSCOPY (EGD);  Surgeon: Danie Binder, MD;  Location: [facility];  Service: Endoscopy;  Laterality: N/A;   GIVENS CAPSULE STUDY N/A 03/13/2014   Procedure: GIVENS CAPSULE STUDY;  Surgeon: Danie Binder, MD;  Location: [facility];  Service: Endoscopy;  Laterality: N/A;  7:30   JOINT REPLACEMENT N/A    [employer] 06/16/2020   NECK SURGERY  2007   RIGHT PARTIAL MASTECTOMY  11-11-2004  DR PETER YOUNG   DUCTAL CARCINOMA IN SITU RIGHT BREAST   TOTAL ABDOMINAL HYSTERECTOMY W/ BILATERAL SALPINGOOPHORECTOMY  1990   TOTAL KNEE ARTHROPLASTY Right 12/21/2019   Procedure: RIGHT  TOTAL KNEE ARTHROPLASTY;  Surgeon: [name], MD;  Location: [place];  Service: Orthopedics;  Laterality: Right;   TRANSTHORACIC  ECHOCARDIOGRAM  03-30-2008  DR MARGARET SIMPSON   LV SIZE AND FUNCTION NORMAL/ MODERATE AORTIC ARCH DILATATION/ MILD MR    Family History  Problem Relation Age of Onset   Kidney failure Mother    Hypertension Mother    Heart attack Mother 78   Throat cancer Father    Prostate cancer Father    Lung cancer Father 51   Breast cancer Sister    Hypertension Brother        2 stents   Colon cancer Neg Hx    Colon polyps Neg Hx     Social History   Socioeconomic History   Marital status: Divorced    Spouse name: Not on file   Number of children: 4   Years of education: 12+   Highest education level: Some college, no degree   Occupational History   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  Tobacco Use   Smoking status: Never   Smokeless tobacco: Never  Vaping Use   Vaping Use: Never used  Substance and Sexual Activity   Alcohol use: No   Drug use: No   Sexual activity: Never  Other Topics Concern   Not on file  Social History Narrative   RETIRED IN June 2020.    Social Determinants of Health   Financial Resource Strain: Low Risk    Difficulty of Paying Living Expenses: Not hard at all  Food Insecurity: No Food Insecurity   Worried About Charity fundraiser in the Last Year: Never true   [place] in the Last Year: Never true  Transportation Needs: No Transportation Needs   Lack of Transportation (Medical): No   Lack of Transportation (Non-Medical): No  Physical Activity: Insufficiently Active   Days of Exercise per Week: 3 days   Minutes of Exercise per Session: 30 min  Stress: Not on file  Social Connections: Moderately Integrated   Frequency of Communication with Friends and Family: More than three times a week   Frequency of Social Gatherings with Friends and Family: More than three times a week   Attends Religious Services: More than 4 times per year   Active Member of [employer] or Organizations: Yes   Attends Music therapist: More than 4 times per year   Marital Status: Divorced  Human resources officer Violence: Not At Risk   Fear of Current or Ex-Partner: No   Emotionally Abused: No   Physically Abused: No   Sexually Abused: No    Outpatient Medications Prior to Visit  Medication Sig Dispense Refill   acetaminophen (TYLENOL) 500 MG tablet Take one to  two tablets by mouth at night for pain 50 tablet 0   amoxicillin (AMOXIL) 500 MG capsule Take 500 mg by mouth 3 (three) times daily.     Ascorbic Acid (VITAMIN C) 1000 MG tablet Take 1,000 mg by mouth daily.     aspirin EC 325 MG tablet Take 1 tablet (325 mg total) by mouth daily.  100 tablet 3   betamethasone dipropionate (DIPROLENE) 0.05 % cream Apply sparingly two times daily for 10 days, then as needed, to affected areas( hairline, neck) 45 g 1   calcium-vitamin D (OSCAL WITH D) 500-200 MG-UNIT tablet Take 1 tablet by mouth daily with breakfast.     carvedilol (COREG) 3.125 MG tablet TAKE 1 TABLET(3.125 MG) BY MOUTH TWICE DAILY WITH A MEAL 180 tablet 3   Cholecalciferol (VITAMIN D) 50 MCG (2000  UT) tablet Take 2,000 Units by mouth daily.      cycloSPORINE (RESTASIS) 0.05 % ophthalmic emulsion 1 drop 2 (two) times daily.     docusate sodium (COLACE) 100 MG capsule Take 100 mg by mouth daily as needed for mild constipation.     gabapentin (NEURONTIN) 100 MG capsule TAKE 2 CAPSULES BY MOUTH ONCE DAILY AT BEDTIME 180 capsule 0   guaifenesin (HUMIBID E) 400 MG TABS tablet Take 400 mg by mouth daily as needed (Mucus build up). Mucus relief     hydrochlorothiazide (HYDRODIURIL) 25 MG tablet Take 1 tablet (25 mg total) by mouth daily. 90 tablet 3   losartan (COZAAR) 50 MG tablet Take one and a half  tablets by mouth once daily for blood pressure 135 tablet 1   lovastatin (MEVACOR) 40 MG tablet Take 1 tablet by mouth once daily 90 tablet 0   MAGNESIUM PO Take by mouth every other day.     meclizine (ANTIVERT) 25 MG tablet Take 1 tablet (25 mg total) by mouth 3 (three) times daily as needed for dizziness. 30 tablet 0   montelukast (SINGULAIR) 10 MG tablet TAKE 1 TABLET BY MOUTH AT BEDTIME 90 tablet 0   Multiple Vitamin (MULTIVITAMIN) tablet Take 1 tablet by mouth daily. One a day woman's     potassium chloride (KLOR-CON) 10 MEQ tablet Take 1 tablet (10 mEq total) by mouth daily. 90 tablet 1   zinc gluconate 50 MG tablet Take 50 mg by mouth daily.     No facility-administered medications prior to visit.    Allergies  Allergen Reactions   Codeine Other (See Comments)    CONFUSION/ DIZZY Other reaction(s): Other (see comments) CONFUSION/ DIZZY   Quinolones Other (See  Comments)    Patient was warned about not using Cipro and similar antibiotics. Recent studies have raised concern that fluoroquinolone antibiotics could be associated with an increased risk of aortic aneurysm Fluoroquinolones have non-antimicrobial properties that might jeopardise the integrity of the extracellular matrix of the vascular wall I. Other reaction(s): Other (see comments) Patient was warned about not using Cipro and similar antibiotics. Recent studies have raised concern that fluoroquinolone antibiotics could be associated with an increased risk of aortic aneurysm Fluoroquinolones have non-antimicrobial properties that might jeopardise the integrity of the extracellular matrix of the vascular wall I.    ROS: Currently taking antibiotics for a tooth abscess that will require future dental intervention. Recent sinus infection has resolved.  Otherwise as per the HPI.    Objective:    Physical Exam Constitutional:      General: She is not in acute distress.    Appearance: Normal appearance. She is obese. She is not ill-appearing.  HENT:     Head: Normocephalic and atraumatic.  Cardiovascular:     Rate and Rhythm: Normal rate and regular rhythm.     Heart sounds: No murmur heard.   No gallop.  Pulmonary:     Effort: Pulmonary effort is normal.     Breath sounds: Normal breath sounds.  Musculoskeletal:     Right lower leg: No edema.     Left lower leg: No edema.  Neurological:     General: No focal deficit present.     Mental Status: She is alert and oriented to person, place, and time.  Psychiatric:        Mood and Affect: Mood normal.    BP (!) 152/88 (BP Location: Left Arm, Patient Position: Sitting)    Pulse 61  Resp 20    Ht 5' 1"  (1.549 m)    Wt 187 lb (84.8 kg)    SpO2 94% Comment: RA   BMI 35.33 kg/m  Wt Readings from Last 3 Encounters:  10/01/21 187 lb (84.8 kg)  09/10/21 188 lb 0.6 oz (85.3 kg)  07/10/21 188 lb 1.3 oz (85.3 kg)    Health Maintenance  Due  Topic Date Due   COVID-19 Vaccine (5 - Booster for Moderna series) 05/13/2021   TETANUS/TDAP  07/14/2021    There are no preventive care reminders to display for this patient.   Lab Results  Component Value Date   TSH 3.380 11/29/2020   Lab Results  Component Value Date   WBC 3.8 06/12/2021   HGB 11.5 06/12/2021   HCT 34.3 06/12/2021   MCV 84 06/12/2021   PLT 309 06/12/2021   Lab Results  Component Value Date   NA 146 (H) 09/18/2021   K 4.4 09/18/2021   CO2 27 09/18/2021   GLUCOSE 97 09/18/2021   BUN 29 (H) 09/18/2021   CREATININE 1.13 (H) 09/18/2021   BILITOT 0.4 09/18/2021   ALKPHOS 96 09/18/2021   AST 19 09/18/2021   ALT 10 09/18/2021   PROT 7.2 09/18/2021   ALBUMIN 4.4 09/18/2021   CALCIUM 9.8 09/18/2021   ANIONGAP 11 12/14/2019   EGFR 51 (L) 09/18/2021   Lab Results  Component Value Date   CHOL 176 09/18/2021   Lab Results  Component Value Date   HDL 76 09/18/2021   Lab Results  Component Value Date   LDLCALC 91 09/18/2021   Lab Results  Component Value Date   TRIG 44 09/18/2021   Lab Results  Component Value Date   CHOLHDL 2.3 09/18/2021   Lab Results  Component Value Date   HGBA1C 5.6 05/27/2014   CT ANGIO CHEST AORTA W/CM & OR WO/CM  Result Date: 10/01/2021 CLINICAL DATA:  Thoracic aortic aneurysm (TAA), follow up EXAM: CT ANGIOGRAPHY CHEST WITH CONTRAST TECHNIQUE: Multidetector CT imaging of the chest was performed using the standard protocol during bolus administration of intravenous contrast. Multiplanar CT image reconstructions and MIPs were obtained to evaluate the vascular anatomy. RADIATION DOSE REDUCTION: This exam was performed according to the departmental dose-optimization program which includes automated exposure control, adjustment of the mA and/or kV according to patient size and/or use of iterative reconstruction technique. CONTRAST:  43m ISOVUE-370 IOPAMIDOL (ISOVUE-370) INJECTION 76% COMPARISON:  CT a chest 08/09/2020.  FINDINGS: Cardiovascular: Normal cardiac size.No pericardial disease.The ascending aorta measures 4.2 cm, previously 4.1 cm (series 5, image 61). There is no central pulmonary embolism. Mediastinum/Nodes: No lymphadenopathy.The thyroid is unremarkable.Esophagus is unremarkable.The trachea is unremarkable. Lungs/Pleura: No focal airspace consolidation. Unchanged scattered atelectasis. No focal airspace consolidation. Calcified granuloma in the left apex, unchanged.No suspicious pulmonary nodules or masses.No pleural effusion.No pneumothorax. Upper Abdomen: Unchanged cyst in the posterior right hepatic lobe. No acute abnormality. Musculoskeletal: No acute osseous abnormality.No suspicious lytic or blastic lesions. Severe left glenohumeral osteoarthritis. Multilevel degenerative changes of the spine. Review of the MIP images confirms the above findings. IMPRESSION: Ascending aortic aneurysm measures 4.2 cm, previously 4.1 cm. Recommend annual imaging followup by CTA or MRA. This recommendation follows 2010 ACCF/AHA/AATS/ACR/ASA/SCA/SCAI/SIR/STS/SVM Guidelines for the Diagnosis and Management of Patients with Thoracic Aortic Disease. Circulation. 2010; 121:: L935-T017 Aortic aneurysm NOS (ICD10-I71.9). No other acute findings in the chest. Electronically Signed   By: JMaurine SimmeringM.D.   On: 10/01/2021 13:20       Assessment & Plan:  Problem List Items Addressed This Visit     Aneurysm of thoracic aorta - Primary   Assessment/plan: The scan results were reviewed with the patient.  Measurement currently is at 4.2 cm.  This is slightly bigger than last year.  She had an echocardiogram done in 2020.  She had no significant valvular stenosis or insufficiency.  We discussed lifestyle focused management including diet and exercise.  She will continue to be seen by her primary care and other physicians for management of hypertension and other cardiac risk factors.  We will see her in 1 year with a repeat CTA of  the chest for ongoing surveillance of the aneurysm.  No orders of the defined types were placed in this encounter.    John Giovanni, PA-C

## 2021-10-03 DIAGNOSIS — I7121 Aneurysm of the ascending aorta, without rupture: Secondary | ICD-10-CM | POA: Diagnosis not present

## 2021-10-03 DIAGNOSIS — N189 Chronic kidney disease, unspecified: Secondary | ICD-10-CM | POA: Diagnosis not present

## 2021-10-03 DIAGNOSIS — D631 Anemia in chronic kidney disease: Secondary | ICD-10-CM | POA: Diagnosis not present

## 2021-10-03 DIAGNOSIS — I129 Hypertensive chronic kidney disease with stage 1 through stage 4 chronic kidney disease, or unspecified chronic kidney disease: Secondary | ICD-10-CM | POA: Diagnosis not present

## 2021-10-03 DIAGNOSIS — N1832 Chronic kidney disease, stage 3b: Secondary | ICD-10-CM | POA: Diagnosis not present

## 2021-10-09 DIAGNOSIS — I7121 Aneurysm of the ascending aorta, without rupture: Secondary | ICD-10-CM | POA: Diagnosis not present

## 2021-10-09 DIAGNOSIS — N189 Chronic kidney disease, unspecified: Secondary | ICD-10-CM | POA: Diagnosis not present

## 2021-10-09 DIAGNOSIS — N1831 Chronic kidney disease, stage 3a: Secondary | ICD-10-CM | POA: Diagnosis not present

## 2021-10-09 DIAGNOSIS — I129 Hypertensive chronic kidney disease with stage 1 through stage 4 chronic kidney disease, or unspecified chronic kidney disease: Secondary | ICD-10-CM | POA: Diagnosis not present

## 2021-10-09 DIAGNOSIS — D631 Anemia in chronic kidney disease: Secondary | ICD-10-CM | POA: Diagnosis not present

## 2021-10-09 DIAGNOSIS — D508 Other iron deficiency anemias: Secondary | ICD-10-CM | POA: Diagnosis not present

## 2021-10-21 ENCOUNTER — Encounter: Payer: Self-pay | Admitting: Internal Medicine

## 2021-11-14 ENCOUNTER — Other Ambulatory Visit: Payer: Self-pay | Admitting: Family Medicine

## 2021-12-06 DIAGNOSIS — I129 Hypertensive chronic kidney disease with stage 1 through stage 4 chronic kidney disease, or unspecified chronic kidney disease: Secondary | ICD-10-CM | POA: Diagnosis not present

## 2021-12-06 DIAGNOSIS — I7121 Aneurysm of the ascending aorta, without rupture: Secondary | ICD-10-CM | POA: Diagnosis not present

## 2021-12-06 DIAGNOSIS — N189 Chronic kidney disease, unspecified: Secondary | ICD-10-CM | POA: Diagnosis not present

## 2021-12-06 DIAGNOSIS — D508 Other iron deficiency anemias: Secondary | ICD-10-CM | POA: Diagnosis not present

## 2021-12-06 DIAGNOSIS — D631 Anemia in chronic kidney disease: Secondary | ICD-10-CM | POA: Diagnosis not present

## 2021-12-06 DIAGNOSIS — N1831 Chronic kidney disease, stage 3a: Secondary | ICD-10-CM | POA: Diagnosis not present

## 2021-12-09 ENCOUNTER — Other Ambulatory Visit: Payer: Self-pay | Admitting: Family Medicine

## 2021-12-12 DIAGNOSIS — E6609 Other obesity due to excess calories: Secondary | ICD-10-CM | POA: Diagnosis not present

## 2021-12-12 DIAGNOSIS — D631 Anemia in chronic kidney disease: Secondary | ICD-10-CM | POA: Diagnosis not present

## 2021-12-12 DIAGNOSIS — I129 Hypertensive chronic kidney disease with stage 1 through stage 4 chronic kidney disease, or unspecified chronic kidney disease: Secondary | ICD-10-CM | POA: Diagnosis not present

## 2021-12-12 DIAGNOSIS — N189 Chronic kidney disease, unspecified: Secondary | ICD-10-CM | POA: Diagnosis not present

## 2021-12-12 DIAGNOSIS — N1832 Chronic kidney disease, stage 3b: Secondary | ICD-10-CM | POA: Diagnosis not present

## 2022-01-03 ENCOUNTER — Encounter: Payer: Self-pay | Admitting: Nurse Practitioner

## 2022-01-03 ENCOUNTER — Ambulatory Visit (INDEPENDENT_AMBULATORY_CARE_PROVIDER_SITE_OTHER): Payer: PPO | Admitting: Nurse Practitioner

## 2022-01-03 DIAGNOSIS — J309 Allergic rhinitis, unspecified: Secondary | ICD-10-CM

## 2022-01-03 MED ORDER — BENZONATATE 100 MG PO CAPS: 100.0000 mg | ORAL_CAPSULE | Freq: Two times a day (BID) | ORAL | 0 refills | Status: DC | PRN

## 2022-01-03 MED ORDER — FLUTICASONE PROPIONATE 50 MCG/ACT NA SUSP: 2.0000 | Freq: Every day | NASAL | 6 refills | Status: DC

## 2022-01-03 NOTE — Progress Notes (Signed)
Virtual Visit via Telephone Note ? ?I connected with Desiree [name] @ on 01/03/22 at  4:40 PM EDT by telephone and verified that I am speaking with the correct person using two identifiers.  I spent 7 minutes on this telephone encounter. ? ?Location: ?Patient: Home ?Provider: Office ?  ?I discussed the limitations, risks, security and privacy concerns of performing an evaluation and management service by telephone and the availability of in person appointments. I also discussed with the patient that there may be a patient responsible charge related to this service. The patient expressed understanding and agreed to proceed. ? ? ?History of Present Illness: ?Desiree [name] with past medical history of essential hypertension, aneurysm of thoracic aorta, allergic rhinitis, osteopenia, hyperlipidemia, malignant neoplasm of female breast, anemia c/o cough with clear sputum, cough occurring mostly at night, started 3 days, sneezing when she goes outside, runny nose with clear draiange, denies sob, wheezing, cp, fever, sore throat, headache, bloody sputum.  Patient denies any known sick contacts, up-to-date with flu vaccine has had her COVID-vaccines, takes robitussin PRN not effective.  On daily Singulair 10 mg at bedtime. ?Observations/Objective: ? ? ?Assessment and Plan: ?Allergic rhinitis ?On singular 10 mg daily at bedtime ?Start Flonase nasal spray 2 spray daily ?Tessalon 100 mg twice daily as needed cough. ?I do not suspect any infection at this time.  ? ?Follow Up Instructions: ? ?  ?I discussed the assessment and treatment plan with the patient. The patient was provided an opportunity to ask questions and all were answered. The patient agreed with the plan and demonstrated an understanding of the instructions. ?  ?The patient was advised to call back or seek an in-person evaluation if the symptoms worsen or if the condition fails to improve as anticipated.  ?

## 2022-01-04 ENCOUNTER — Encounter: Payer: Self-pay | Admitting: Nurse Practitioner

## 2022-01-04 NOTE — Assessment & Plan Note (Signed)
On singular 10 mg daily at bedtime ?Start Flonase nasal spray 2 spray daily ?Tessalon 100 mg twice daily as needed cough. ?I do not suspect any infection at this time. ?

## 2022-01-28 NOTE — Progress Notes (Signed)
Referring Provider: Dr. Theador Hawthorne Primary Care Physician:  [name], MD Primary Gastroenterologist:  Dr. Abbey Chatters  Chief Complaint  Patient presents with   hemoglobin low    HPI:   Desiree [name] is a 76 y.o. female presenting today at the request of Dr. Theador Hawthorne for IDA.   Prior GI evaluation in 2015 for normocytic anemia with Hgb in the 11 range: EGD: Mild gastritis/duodenitis s/p biopsied, no obvious source for anemia.  Pathology with mild chronic gastritis with intestinal metaplasia, negative for H. pylori, duodenal biopsy benign. Colonoscopy: Normal TI, mild diverticulosis in sigmoid and transverse colon, small external hemorrhoids, no obvious source for anemia.  Repeat colonoscopy in 10-15 years if benefits outweigh risk. Givens capsule 03/13/2014: Normal exam.  Suspected anemia most likely due to chronic disease.  Recent cologuard June 2022, negative.   Hemoglobin has been stable in the upper 10-11 range.   In February 2023, iron saturation low at 13%, iron 44, no ferritin obtained. Hgb 10.9 with normocytic indices.  Nephrology requested patient to start oral iron and was referred to GI. Repeat labs in April 2023 with saturation 18%, iron 60, ferritin elevated at 223, Hgb 11.0.   Today:  Reports she is feeling well overall.  Denies BRBPR, melena, hematuria, abnormal vaginal bleeding, epistaxis, abdominal pain, change in bowel habits, constipation, diarrhea, unintentional weight loss, nausea, vomiting, reflux symptoms, or dysphagia.  Usually takes Tylenol if needed.  Rare use of ibuprofen. Takes 325 mg aspirin.   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--  S/P  RIGHT MASTECTMOY AND RADIATION--  NO RECURRANCE   History of CVA (cerebrovascular accident) PER SCAN IN 2011   Hyperlipidemia    Hypertension    Impaired fasting glucose PER PCP  DR Moshe Cipro   WATCH DIET   Mixed urge and stress incontinence    Nocturia    Sinus drainage    Thoracic ascending aortic aneurysm ([place]) LAST CHEST CT 09-02-2010--  FOLLOWED BY  CARDIOLOGIST--  DR GHWEXHBZ   Vaginal wall prolapse     Past Surgical History:  Procedure Laterality Date   ABDOMINAL HYSTERECTOMY     BILATERAL CARPAL TUNNEL RELEASE  1980'S   CARDIAC CATHETERIZATION  02-01-2009  DR [place]   NORMAL CORONARY ARTERIES/ NORMAL LV SIZE AND FUNCTION/ AORTIC ROOT SIZE AT THE UPPER LIMIT OF NORMAL    CARPAL TUNNEL RELEASE     CERVICAL [place] SURGERY  12-12-2005  DR Vertell Limber   LEFT  C6 - C7 HERINATED / DDD/ SPONDYLOSIS   COLONOSCOPY N/A 02/10/2014   Surgeon: Danie Binder, MD; Normal TI, mild diverticulosis in sigmoid and transverse colon, small external hemorrhoids, no obvious source for anemia.  Repeat colonoscopy in 10-15 years if benefits outweigh risk.   CYSTOCELE REPAIR  08/02/2012   Procedure: ANTERIOR REPAIR (CYSTOCELE);  Surgeon: Ailene Rud, MD;  Location: [facility];  Service: Urology;  Laterality: N/A;  [employer] Uphold Anterior Pelvic Floor Sacrospinus Repair. Anterior wall of the vagina.   ESOPHAGOGASTRODUODENOSCOPY N/A 02/10/2014   Surgeon: Danie Binder, MD;   Mild gastritis/duodenitis s/p biopsied, no obvious  source for anemia.  Pathology with mild chronic gastritis with intestinal metaplasia, negative for H. pylori, duodenal biopsy benign.   GIVENS CAPSULE STUDY N/A 03/13/2014   Surgeon: Danie Binder, MD;  Normal exam.  Suspected anemia most likely due to chronic disease.   JOINT REPLACEMENT N/A    [employer] 06/16/2020   NECK SURGERY  2007   RIGHT PARTIAL MASTECTOMY  11-11-2004  DR PETER YOUNG   DUCTAL CARCINOMA IN SITU  RIGHT BREAST   TOTAL ABDOMINAL HYSTERECTOMY W/ BILATERAL SALPINGOOPHORECTOMY  1990   TOTAL KNEE ARTHROPLASTY Right 12/21/2019   Procedure: RIGHT  TOTAL KNEE ARTHROPLASTY;  Surgeon: [name], MD;  Location: [place];  Service: Orthopedics;  Laterality: Right;   TRANSTHORACIC ECHOCARDIOGRAM  03-30-2008  DR MARGARET SIMPSON   LV SIZE AND FUNCTION NORMAL/ MODERATE AORTIC ARCH DILATATION/ MILD MR    Current Outpatient Medications  Medication Sig Dispense Refill   acetaminophen (TYLENOL) 500 MG tablet Take one to  two tablets by mouth at night for pain 50 tablet 0   Ascorbic Acid (VITAMIN C) 1000 MG tablet Take 1,000 mg by mouth daily.     aspirin EC 325 MG tablet Take 1 tablet (325 mg total) by mouth daily. 100 tablet 3   betamethasone dipropionate (DIPROLENE) 0.05 % cream Apply sparingly two times daily for 10 days, then as needed, to affected areas( hairline, neck) 45 g 1   calcium-vitamin D (OSCAL WITH D) 500-200 MG-UNIT tablet Take 1 tablet by mouth daily with breakfast.     carvedilol (COREG) 3.125 MG tablet TAKE 1 TABLET(3.125 MG) BY MOUTH TWICE DAILY WITH A MEAL 180 tablet 3   Cholecalciferol (VITAMIN D) 50 MCG (2000 UT) tablet Take 2,000 Units by mouth daily.      cycloSPORINE (RESTASIS) 0.05 % ophthalmic emulsion 1 drop 2 (two) times daily.     Ferrous Sulfate (SLOW FE PO) Take 1 capsule by mouth every other day.     gabapentin (NEURONTIN) 100 MG capsule TAKE 2 CAPSULES BY MOUTH ONCE DAILY AT BEDTIME 180 capsule 0   hydrochlorothiazide (HYDRODIURIL) 25 MG tablet Take 1 tablet (25 mg total) by mouth daily. 90 tablet 3   losartan (COZAAR) 50 MG tablet Take one and a half  tablets by mouth once daily for blood pressure 135 tablet 1   lovastatin (MEVACOR) 40 MG tablet Take 1 tablet by mouth once daily 90 tablet 0   meclizine (ANTIVERT) 25 MG tablet Take 1 tablet (25 mg total) by mouth 3 (three) times daily as needed for dizziness. 30 tablet 0   montelukast (SINGULAIR) 10 MG tablet TAKE 1  TABLET BY MOUTH AT BEDTIME 90 tablet 0   Multiple Vitamin (MULTIVITAMIN) tablet Take 1 tablet by mouth daily. One a day woman's     potassium chloride (KLOR-CON) 10 MEQ tablet Take 1 tablet (10 mEq total) by mouth daily. 90 tablet 1   zinc gluconate 50 MG tablet Take 50 mg by mouth daily.     No current facility-administered medications for this visit.    Allergies as of 01/30/2022 - Review Complete 01/30/2022  Allergen Reaction Noted   Codeine Other (See Comments) 09/20/2020   Quinolones Other (See Comments) 08/19/2018    Family History  Problem Relation Age of Onset   Kidney failure Mother    Hypertension Mother    Heart attack Mother 55   Throat cancer Father    Prostate cancer Father    Lung cancer Father 84   Breast cancer Sister  Hypertension Brother        2 stents   Colon cancer Neg Hx    Colon polyps Neg Hx     Social History   Socioeconomic History   Marital status: Divorced    Spouse name: Not on file   Number of children: 4   Years of education: 12+   Highest education level: Some college, no degree  Occupational History   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  Tobacco Use   Smoking status: Never   Smokeless tobacco: Never  Vaping Use   Vaping Use: Never used  Substance and Sexual Activity   Alcohol use: No   Drug use: No   Sexual activity: Never  Other Topics Concern   Not on file  Social History Narrative   RETIRED IN June 2020.    Social Determinants of Health   Financial Resource Strain: Low Risk  (07/05/2021)   Overall Financial Resource Strain (CARDIA)    Difficulty of Paying Living Expenses: Not hard at all  Food Insecurity: No Food Insecurity (07/05/2021)   Hunger Vital Sign    Worried About Running Out of Food in the Last Year: Never true    Ran Out of Food in the Last Year: Never true  Transportation Needs: No Transportation Needs (07/05/2021)   PRAPARE - Radiographer, therapeutic (Medical): No    Lack of Transportation (Non-Medical): No  Physical Activity: Insufficiently Active (07/05/2021)   Exercise Vital Sign    Days of Exercise per Week: 3 days    Minutes of Exercise per Session: 30 min  Stress: Not on file  Social Connections: Moderately Integrated (07/05/2021)   Social Connection and Isolation Panel [NHANES]    Frequency of Communication with Friends and Family: More than three times a week    Frequency of Social Gatherings with Friends and Family: More than three times a week    Attends Religious Services: More than 4 times per year    Active Member of [employer] or Organizations: Yes    Attends Music therapist: More than 4 times per year    Marital Status: Divorced  Intimate Partner Violence: Not At Risk (07/05/2021)   Humiliation, Afraid, Rape, and Kick questionnaire    Fear of Current or Ex-Partner: No    Emotionally Abused: No    Physically Abused: No    Sexually Abused: No    Review of Systems: Gen: Denies any fever, chills, cold or flulike symptoms, presyncope, syncope. CV: Denies chest pain, heart palpitations. Resp: Denies shortness of breath, cough.  GI: See HPI GU : Denies urinary burning, urinary frequency, urinary hesitancy MS: Denies joint pain. Derm: Denies rash. Psych: Denies depression, anxiety. Heme: See HPI  Physical Exam: BP 140/74   Pulse 70   Temp 97.7 F (36.5 C) (Temporal)   Ht '5\' 1"'$  (1.549 m)   Wt 191 lb 6.4 oz (86.8 kg)   BMI 36.16 kg/m  General:   Alert and oriented. Pleasant and cooperative. Well-nourished and well-developed.  Head:  Normocephalic and atraumatic. Eyes:  Without icterus, sclera clear and conjunctiva pink.  Ears:  Normal auditory acuity. Lungs:  Clear to auscultation bilaterally. No wheezes, rales, or rhonchi. No distress.  Heart:  S1, S2 present without murmurs appreciated.  Abdomen:  +BS, soft, non-tender and non-distended. No HSM noted. No guarding or rebound. No  masses appreciated.  Rectal: No significant internal or external lesions, light brown  stool on gloved exam finger, Hemoccult negative. Msk:  Symmetrical without gross deformities. Normal posture. Extremities:  Without edema. Neurologic:  Alert and  oriented x4;  grossly normal neurologically. Skin:  Intact without significant lesions or rashes. Psych: Normal mood and affect.    Assessment:  76 year old female with history of chronic normocytic anemia with baseline hemoglobin in 10-11 range, CKD, HTN, HLD, CVA, breast cancer, presenting today at the request of Dr. Theador Hawthorne for further evaluation of IDA.  IDA: Chronic history of normocytic anemia with baseline hemoglobin in the 10-11 range.  She completed complete GI evaluation in 2015 with EGD, colonoscopy, and Givens capsule with no significant abnormalities, suspected anemia was secondary to chronic disease.  In February of this year, she was found to have low iron saturation of 13%, iron 44, no ferritin obtained.  Hemoglobin at that time was 10.9 with normocytic indices.  Dr. Theador Hawthorne recommended patient starting iron and she was referred to GI.  She is been taking Slow Fe every other day.  Repeat labs in April with iron saturation 18%, iron 60, ferritin elevated at 223, hemoglobin stable 11.0.  Clinically, she is doing well and has no significant GI symptoms, no overt GI bleeding or any other obvious blood loss. She does take 325 mg aspirin daily which increases her risk of gastritis, duodenitis, and PUD.  Hemoccult performed in office today, negative.  Discussed pursuing early interval colonoscopy and depending on findings, possible EGD to ensure no contributing GI source, but patient prefers to hold off and monitor for development of symptoms or worsening anemia.    Plan:  Continue to follow with PCP and Dr. Theador Hawthorne for monitoring of hemoglobin and iron. Patient will notify us of any worsening labs, overt GI bleeding, changes in bowel habits,  unintentional weight loss. She is due for routine colonoscopy in June 2025.  We placed her on recall today. Follow-up as needed.   Aliene Altes, PA-C [facility] 01/30/2022

## 2022-01-30 ENCOUNTER — Encounter: Payer: Self-pay | Admitting: Gastroenterology

## 2022-01-30 ENCOUNTER — Ambulatory Visit (INDEPENDENT_AMBULATORY_CARE_PROVIDER_SITE_OTHER): Payer: PPO | Admitting: Gastroenterology

## 2022-01-30 VITALS — BP 140/74 | HR 70 | Temp 97.7°F | Ht 61.0 in | Wt 191.4 lb

## 2022-01-30 DIAGNOSIS — D509 Iron deficiency anemia, unspecified: Secondary | ICD-10-CM | POA: Diagnosis not present

## 2022-01-30 NOTE — Patient Instructions (Signed)
We will hold off on pursuing a colonoscopy and upper endoscopy as you have requested.  For now, we will plan to see you back as needed.  If you notice any change in bowel habits, blood in your stools, black stools, unintentional weight loss, or your hemoglobin or iron begins to decline, please let us know.  Your next routine colonoscopy is due in June 2025.  You will receive notification when it is time to schedule this appointment.  It was very nice meeting you today!    Aliene Altes, PA-C [facility]

## 2022-02-13 ENCOUNTER — Other Ambulatory Visit (HOSPITAL_COMMUNITY): Payer: Self-pay | Admitting: Family Medicine

## 2022-02-13 DIAGNOSIS — Z1231 Encounter for screening mammogram for malignant neoplasm of breast: Secondary | ICD-10-CM

## 2022-02-14 ENCOUNTER — Other Ambulatory Visit: Payer: Self-pay | Admitting: Family Medicine

## 2022-03-06 ENCOUNTER — Other Ambulatory Visit: Payer: Self-pay | Admitting: Cardiovascular Disease

## 2022-03-07 ENCOUNTER — Ambulatory Visit (HOSPITAL_COMMUNITY)
Admission: RE | Admit: 2022-03-07 | Discharge: 2022-03-07 | Disposition: A | Discharge status: Home / Self Care | Payer: PPO | Source: Ambulatory Visit | Attending: Family Medicine | Admitting: Family Medicine

## 2022-03-07 DIAGNOSIS — Z1231 Encounter for screening mammogram for malignant neoplasm of breast: Secondary | ICD-10-CM | POA: Diagnosis not present

## 2022-03-10 ENCOUNTER — Other Ambulatory Visit: Payer: Self-pay | Admitting: Cardiovascular Disease

## 2022-03-12 ENCOUNTER — Telehealth: Payer: Self-pay | Admitting: Family Medicine

## 2022-03-12 ENCOUNTER — Encounter: Payer: Self-pay | Admitting: Family Medicine

## 2022-03-12 ENCOUNTER — Ambulatory Visit (INDEPENDENT_AMBULATORY_CARE_PROVIDER_SITE_OTHER): Payer: PPO | Admitting: Family Medicine

## 2022-03-12 VITALS — BP 160/90 | HR 66 | Ht 61.5 in | Wt 190.0 lb

## 2022-03-12 DIAGNOSIS — I10 Essential (primary) hypertension: Secondary | ICD-10-CM | POA: Diagnosis not present

## 2022-03-12 DIAGNOSIS — M542 Cervicalgia: Secondary | ICD-10-CM | POA: Diagnosis not present

## 2022-03-12 MED ORDER — LOSARTAN POTASSIUM 100 MG PO TABS: 100.0000 mg | ORAL_TABLET | Freq: Every day | ORAL | 2 refills | Status: DC

## 2022-03-12 MED ORDER — CHLORTHALIDONE 50 MG PO TABS: 50.0000 mg | ORAL_TABLET | Freq: Every day | ORAL | 3 refills | Status: DC

## 2022-03-12 NOTE — Progress Notes (Signed)
Desiree [name]     MRN: [id number]      DOB: 1946/03/16   HPI Ms. Gronewold is here for follow up and re-evaluation of chronic medical conditions, medication management and review of any available recent lab and radiology data.  Preventive health is updated, specifically  Cancer screening and Immunization.   Questions or concerns regarding consultations or procedures which the PT has had in the interim are  addressed. The PT denies any adverse reactions to current medications since the last visit.  2 week h/o increased left neck and upper arm pain up to a 10 with spasm. Has been unable on left side/ arm x 6 month, pain has been a 5 Reports  becoming withdrawn and not wanting to be bothered with even close family and friends in recent times, not sure why  ROS Denies recent fever or chills. Denies sinus pressure, nasal congestion, ear pain or sore throat. Denies chest congestion, productive cough or wheezing. Denies chest pains, palpitations and leg swelling Denies abdominal pain, nausea, vomiting,diarrhea or constipation.   Denies dysuria, frequency, hesitancy or incontinence. ia. Denies skin break down or rash.   PE  BP (!) 160/90   Pulse 66   Ht 5' 1.5" (1.562 m)   Wt 190 lb (86.2 kg)   SpO2 98%   BMI 35.32 kg/m    Patient alert and oriented and in no cardiopulmonary distress.Pt in pain  HEENT: No facial asymmetry, EOMI,     Neck decreased ROM  Chest: Clear to auscultation bilaterally.  CVS: S1, S2 no murmurs, no S3.Regular rate.  ABD: Soft non tender.   Ext: No edema  MS: Adequate ROM spine, shoulders, hips and knees.  Skin: Intact, no ulcerations or rash noted.  Psych: Good eye contact, normal affect. Memory intact not anxious or depressed appearing.  CNS: CN 2-12 intact, power,  normal throughout.no focal deficits noted.   Assessment & Plan  Essential hypertension Uncontrolled, inc dose of spironolactone DASH diet and commitment to daily physical  activity for a minimum of 30 minutes discussed and encouraged, as a part of hypertension management. The importance of attaining a healthy weight is also discussed.     03/12/2022   11:17 AM 03/12/2022   11:15 AM 03/12/2022   10:01 AM 01/30/2022    9:28 AM 10/01/2021    1:35 PM 09/10/2021   10:22 AM 07/10/2021   10:06 AM  BP/Weight  Systolic BP 798 921 194 174 081 448 185  Diastolic BP 90 80 78 74 88 76 82  Wt. (Lbs)   190 191.4 187 188.04   BMI   35.32 kg/m2 36.16 kg/m2 35.33 kg/m2 35.53 kg/m2        Morbid obesity (HCC)  Patient re-educated about  the importance of commitment to a  minimum of 150 minutes of exercise per week as able.  The importance of healthy food choices with portion control discussed, as well as eating regularly and within a 12 hour window most days. The need to choose "clean , green" food 50 to 75% of the time is discussed, as well as to make water the primary drink and set a goal of 64 ounces water daily.       03/12/2022   10:01 AM 01/30/2022    9:28 AM 10/01/2021    1:35 PM  Weight /BMI  Weight 190 lb 191 lb 6.4 oz 187 lb  Height 5' 1.5" (1.562 m) '5\' 1"'$  (1.549 m) '5\' 1"'$  (1.549 m)  BMI 35.32 kg/m2 36.16 kg/m2 35.33 kg/m2      Neck pain on left side Worsening left neck and upper extremity pain in past 6 months, established arthritis and disc disease also h/o past C spine surgery, rept MRI and re fer to Neurosurgeon who operated in the past

## 2022-03-12 NOTE — Patient Instructions (Addendum)
F/U in 6 to 8 weeks, re evaluate blood pressure, please bring your medications to every visit  Higher dose cozaar starting today 100 mg once daily.Wads already on cozar 100 mg daily, she called back and confirmed. Spironolactone dose increased yo 50 mg daily  Nurse please verify with pt what she has been taking , he can call from her home, need doses and how taking  Chem 7 and EGFR today  You are referred for MRI of neck and then to see Dr Vertell Limber  Thanks for choosing [facility], we consider it a privelige to serve you.

## 2022-03-12 NOTE — Telephone Encounter (Signed)
  PATIENT FYI   Pt called back to let us know she is on Losartin '100mg'$ . States she was also put on chlorthalidone '25mg'$ .

## 2022-03-12 NOTE — Telephone Encounter (Signed)
Pt aware.

## 2022-03-13 ENCOUNTER — Encounter: Payer: Self-pay | Admitting: Family Medicine

## 2022-03-13 LAB — BMP8+EGFR
BUN/Creatinine Ratio: 24 (ref 12–28)
BUN: 32 mg/dL — ABNORMAL HIGH (ref 8–27)
CO2: 25 mmol/L (ref 20–29)
Calcium: 10.3 mg/dL (ref 8.7–10.3)
Chloride: 101 mmol/L (ref 96–106)
Creatinine, Ser: 1.35 mg/dL — ABNORMAL HIGH (ref 0.57–1.00)
Glucose: 99 mg/dL (ref 70–99)
Potassium: 4.8 mmol/L (ref 3.5–5.2)
Sodium: 142 mmol/L (ref 134–144)
eGFR: 41 mL/min/{1.73_m2} — ABNORMAL LOW (ref >59)

## 2022-03-13 NOTE — Assessment & Plan Note (Signed)
Worsening left neck and upper extremity pain in past 6 months, established arthritis and disc disease also h/o past C spine surgery, rept MRI and re fer to Neurosurgeon who operated in the past

## 2022-03-13 NOTE — Assessment & Plan Note (Signed)
  Patient re-educated about  the importance of commitment to a  minimum of 150 minutes of exercise per week as able.  The importance of healthy food choices with portion control discussed, as well as eating regularly and within a 12 hour window most days. The need to choose "clean , green" food 50 to 75% of the time is discussed, as well as to make water the primary drink and set a goal of 64 ounces water daily.       03/12/2022   10:01 AM 01/30/2022    9:28 AM 10/01/2021    1:35 PM  Weight /BMI  Weight 190 lb 191 lb 6.4 oz 187 lb  Height 5' 1.5" (1.562 m) '5\' 1"'$  (1.549 m) '5\' 1"'$  (1.549 m)  BMI 35.32 kg/m2 36.16 kg/m2 35.33 kg/m2

## 2022-03-13 NOTE — Assessment & Plan Note (Signed)
Uncontrolled, inc dose of spironolactone DASH diet and commitment to daily physical activity for a minimum of 30 minutes discussed and encouraged, as a part of hypertension management. The importance of attaining a healthy weight is also discussed.     03/12/2022   11:17 AM 03/12/2022   11:15 AM 03/12/2022   10:01 AM 01/30/2022    9:28 AM 10/01/2021    1:35 PM 09/10/2021   10:22 AM 07/10/2021   10:06 AM  BP/Weight  Systolic BP 540 086 761 950 932 671 245  Diastolic BP 90 80 78 74 88 76 82  Wt. (Lbs)   190 191.4 187 188.04   BMI   35.32 kg/m2 36.16 kg/m2 35.33 kg/m2 35.53 kg/m2

## 2022-03-14 ENCOUNTER — Telehealth: Payer: Self-pay | Admitting: Family Medicine

## 2022-03-14 ENCOUNTER — Other Ambulatory Visit: Payer: Self-pay | Admitting: Family Medicine

## 2022-03-14 DIAGNOSIS — I10 Essential (primary) hypertension: Secondary | ICD-10-CM

## 2022-03-14 DIAGNOSIS — E860 Dehydration: Secondary | ICD-10-CM

## 2022-03-14 NOTE — Telephone Encounter (Signed)
Pt returning call for labs 

## 2022-03-17 ENCOUNTER — Telehealth: Payer: Self-pay | Admitting: Cardiovascular Disease

## 2022-03-17 DIAGNOSIS — I129 Hypertensive chronic kidney disease with stage 1 through stage 4 chronic kidney disease, or unspecified chronic kidney disease: Secondary | ICD-10-CM | POA: Diagnosis not present

## 2022-03-17 DIAGNOSIS — N189 Chronic kidney disease, unspecified: Secondary | ICD-10-CM | POA: Diagnosis not present

## 2022-03-17 DIAGNOSIS — N1832 Chronic kidney disease, stage 3b: Secondary | ICD-10-CM | POA: Diagnosis not present

## 2022-03-17 DIAGNOSIS — D631 Anemia in chronic kidney disease: Secondary | ICD-10-CM | POA: Diagnosis not present

## 2022-03-17 DIAGNOSIS — E6609 Other obesity due to excess calories: Secondary | ICD-10-CM | POA: Diagnosis not present

## 2022-03-17 NOTE — Telephone Encounter (Signed)
*  STAT* If patient is at the pharmacy, call can be transferred to refill team.   1. Which medications need to be refilled? (please list name of each medication and dose if known) carvedilol (COREG) 3.125 MG tablet  2. Which pharmacy/location (including street and city if local pharmacy) is medication to be sent to? [id number] - [place], [place] [address]  3. Do they need a 30 day or 90 day supply? 90  Patient has appt schd for 8/31. Please advise

## 2022-03-18 DIAGNOSIS — E860 Dehydration: Secondary | ICD-10-CM | POA: Diagnosis not present

## 2022-03-19 ENCOUNTER — Ambulatory Visit (INDEPENDENT_AMBULATORY_CARE_PROVIDER_SITE_OTHER): Payer: PPO | Admitting: Family Medicine

## 2022-03-19 ENCOUNTER — Encounter: Payer: Self-pay | Admitting: Family Medicine

## 2022-03-19 VITALS — BP 136/78 | HR 76 | Resp 16 | Ht 61.0 in | Wt 191.4 lb

## 2022-03-19 DIAGNOSIS — I10 Essential (primary) hypertension: Secondary | ICD-10-CM

## 2022-03-19 DIAGNOSIS — N179 Acute kidney failure, unspecified: Secondary | ICD-10-CM | POA: Diagnosis not present

## 2022-03-19 DIAGNOSIS — N1832 Chronic kidney disease, stage 3b: Secondary | ICD-10-CM

## 2022-03-19 LAB — BMP8+EGFR
BUN/Creatinine Ratio: 19 (ref 12–28)
BUN: 30 mg/dL — ABNORMAL HIGH (ref 8–27)
CO2: 27 mmol/L (ref 20–29)
Calcium: 9.8 mg/dL (ref 8.7–10.3)
Chloride: 102 mmol/L (ref 96–106)
Creatinine, Ser: 1.57 mg/dL — ABNORMAL HIGH (ref 0.57–1.00)
Glucose: 98 mg/dL (ref 70–99)
Potassium: 4.7 mmol/L (ref 3.5–5.2)
Sodium: 142 mmol/L (ref 134–144)
eGFR: 34 mL/min/{1.73_m2} — ABNORMAL LOW (ref >59)

## 2022-03-19 NOTE — Patient Instructions (Addendum)
   Keep appointment tomorrow with Dr Marylene Buerger.  I am concerned  about  your kidney function and will send Dr Marylene Buerger a message today.  Thankful that you have an appointment with him tomorrow  PLEASE take all medicatrions that you take to your visit  70 ounces water daily  Eat 80 % vegetables and fruit, unprocessed, limit salt in your dietas well as sugar  It is important that you exercise regularly at least 30 minutes 5 times a week. If you develop chest pain, have severe difficulty breathing, or feel very tired, stop exercising immediately and seek medical attention  Thanks for choosing [facility], we consider it a privelige to serve you.

## 2022-03-19 NOTE — Telephone Encounter (Signed)
Appt scheduled

## 2022-03-20 ENCOUNTER — Encounter: Payer: Self-pay | Admitting: Family Medicine

## 2022-03-20 DIAGNOSIS — N189 Chronic kidney disease, unspecified: Secondary | ICD-10-CM | POA: Diagnosis not present

## 2022-03-20 DIAGNOSIS — N1832 Chronic kidney disease, stage 3b: Secondary | ICD-10-CM | POA: Insufficient documentation

## 2022-03-20 DIAGNOSIS — I120 Hypertensive chronic kidney disease with stage 5 chronic kidney disease or end stage renal disease: Secondary | ICD-10-CM | POA: Insufficient documentation

## 2022-03-20 DIAGNOSIS — N170 Acute kidney failure with tubular necrosis: Secondary | ICD-10-CM | POA: Diagnosis not present

## 2022-03-20 DIAGNOSIS — I129 Hypertensive chronic kidney disease with stage 1 through stage 4 chronic kidney disease, or unspecified chronic kidney disease: Secondary | ICD-10-CM | POA: Diagnosis not present

## 2022-03-20 DIAGNOSIS — Z6837 Body mass index (BMI) 37.0-37.9, adult: Secondary | ICD-10-CM | POA: Diagnosis not present

## 2022-03-20 DIAGNOSIS — I7121 Aneurysm of the ascending aorta, without rupture: Secondary | ICD-10-CM | POA: Diagnosis not present

## 2022-03-20 DIAGNOSIS — D631 Anemia in chronic kidney disease: Secondary | ICD-10-CM | POA: Diagnosis not present

## 2022-03-20 NOTE — Assessment & Plan Note (Signed)
Controlled on current meds however may need med adjustment based on deterioration in kidney function, nephrology appt in am

## 2022-03-20 NOTE — Progress Notes (Signed)
   MAYETTA CASTLEMAN     MRN: [id number]      DOB: 1945-10-16   HPI Ms. Dragoo is here for follow up of abnormal labs showing deteriorating kidney function, plan is to change anti hypertensive medication, however, she has a f/u with her nephrologist tomorrow and I will defer to Nephrology to make any changes in her meds deemed necessary ROS Denies recent fever or chills. Denies sinus pressure, nasal congestion, ear pain or sore throat. Denies chest congestion, productive cough or wheezing. Denies chest pains, palpitations and leg swelling Denies abdominal pain, nausea, vomiting,diarrhea or constipation.   Denies dysuria, frequency, hesitancy or incontinence. Denies joint pain, swelling and limitation in mobility. Denies headaches, seizures, numbness, or tingling. Denies depression, anxiety or insomnia. Denies skin break down or rash.   PE  BP 136/78   Pulse 76   Resp 16   Ht '5\' 1"'$  (1.549 m)   Wt 191 lb 6.4 oz (86.8 kg)   SpO2 98%   BMI 36.16 kg/m   Patient alert and oriented and in no cardiopulmonary distress.  HEENT: No facial asymmetry, EOMI,     Neck supple .  Chest: Clear to auscultation bilaterally.  CVS: S1, S2 no murmurs, no S3.Regular rate.  ABD: Soft non tender.   Ext: No edema  MS: Adequate ROM spine, shoulders, hips and knees.  Skin: Intact, no ulcerations or rash noted.  Psych: Good eye contact, normal affect. Memory intact not anxious or depressed appearing.  CNS: CN 2-12 intact, power,  normal throughout.no focal deficits noted.   Assessment & Plan  AKI (acute kidney injury) ([place]) AKI in context of CKD, unclear how much is driven by medication, , followed by Nephrology and has am appt, will defer any  further med changes to Nephrology Encouraged to follow plant based diet and reduce processed and salty foods  Essential hypertension Controlled on current meds however may need med adjustment based on deterioration in kidney function, nephrology appt  in am  Stage 3b chronic kidney disease (CKD) ([place]) established with and co managed with Nephrology x 4 months, am appt pending

## 2022-03-20 NOTE — Assessment & Plan Note (Signed)
AKI in context of CKD, unclear how much is driven by medication, , followed by Nephrology and has am appt, will defer any  further med changes to Nephrology Encouraged to follow plant based diet and reduce processed and salty foods

## 2022-03-20 NOTE — Assessment & Plan Note (Signed)
established with and co managed with Nephrology x 4 months, am appt pending

## 2022-03-27 ENCOUNTER — Ambulatory Visit (HOSPITAL_COMMUNITY)
Admission: RE | Admit: 2022-03-27 | Discharge: 2022-03-27 | Disposition: A | Discharge status: Home / Self Care | Payer: PPO | Source: Ambulatory Visit | Attending: Family Medicine | Admitting: Family Medicine

## 2022-03-27 DIAGNOSIS — M4802 Spinal stenosis, cervical region: Secondary | ICD-10-CM | POA: Diagnosis not present

## 2022-03-27 DIAGNOSIS — M542 Cervicalgia: Secondary | ICD-10-CM | POA: Insufficient documentation

## 2022-03-27 DIAGNOSIS — M47812 Spondylosis without myelopathy or radiculopathy, cervical region: Secondary | ICD-10-CM | POA: Diagnosis not present

## 2022-03-27 MED ORDER — CARVEDILOL 3.125 MG PO TABS: ORAL_TABLET | ORAL | 0 refills | Status: DC

## 2022-03-27 MED ORDER — GADOBUTROL 1 MMOL/ML IV SOLN
8.0000 mL | Freq: Once | INTRAVENOUS | Status: AC | PRN
  Administered 2022-03-27: 8 mL via INTRAVENOUS

## 2022-03-27 NOTE — Telephone Encounter (Signed)
Notified pt medication refill was sent to pharmacy.

## 2022-03-27 NOTE — Addendum Note (Signed)
Addended by: Hinton Dyer on: 03/27/2022 10:49 AM   Modules accepted: Orders

## 2022-03-27 NOTE — Telephone Encounter (Signed)
Pt states she has still not received her refill and she is completely out. Pt has made a f/u appt for 04/24/22 with Dr. [name]

## 2022-03-31 DIAGNOSIS — M542 Cervicalgia: Secondary | ICD-10-CM | POA: Diagnosis not present

## 2022-03-31 DIAGNOSIS — M19012 Primary osteoarthritis, left shoulder: Secondary | ICD-10-CM | POA: Diagnosis not present

## 2022-04-07 DIAGNOSIS — M19012 Primary osteoarthritis, left shoulder: Secondary | ICD-10-CM | POA: Diagnosis not present

## 2022-04-07 DIAGNOSIS — M25512 Pain in left shoulder: Secondary | ICD-10-CM | POA: Diagnosis not present

## 2022-04-09 ENCOUNTER — Other Ambulatory Visit: Payer: Self-pay | Admitting: Cardiovascular Disease

## 2022-04-12 ENCOUNTER — Other Ambulatory Visit: Payer: Self-pay | Admitting: Family Medicine

## 2022-04-17 DIAGNOSIS — I129 Hypertensive chronic kidney disease with stage 1 through stage 4 chronic kidney disease, or unspecified chronic kidney disease: Secondary | ICD-10-CM | POA: Diagnosis not present

## 2022-04-17 DIAGNOSIS — I7121 Aneurysm of the ascending aorta, without rupture: Secondary | ICD-10-CM | POA: Diagnosis not present

## 2022-04-17 DIAGNOSIS — N170 Acute kidney failure with tubular necrosis: Secondary | ICD-10-CM | POA: Diagnosis not present

## 2022-04-17 DIAGNOSIS — D631 Anemia in chronic kidney disease: Secondary | ICD-10-CM | POA: Diagnosis not present

## 2022-04-17 DIAGNOSIS — N189 Chronic kidney disease, unspecified: Secondary | ICD-10-CM | POA: Diagnosis not present

## 2022-04-17 DIAGNOSIS — N1832 Chronic kidney disease, stage 3b: Secondary | ICD-10-CM | POA: Diagnosis not present

## 2022-04-23 ENCOUNTER — Ambulatory Visit: Payer: PPO | Admitting: Family Medicine

## 2022-04-23 ENCOUNTER — Ambulatory Visit: Payer: PPO | Attending: Cardiovascular Disease | Admitting: Cardiovascular Disease

## 2022-04-23 ENCOUNTER — Encounter: Payer: Self-pay | Admitting: Cardiovascular Disease

## 2022-04-23 VITALS — BP 132/80 | HR 60 | Ht 61.0 in | Wt 192.8 lb

## 2022-04-23 DIAGNOSIS — I10 Essential (primary) hypertension: Secondary | ICD-10-CM

## 2022-04-23 DIAGNOSIS — N1831 Chronic kidney disease, stage 3a: Secondary | ICD-10-CM

## 2022-04-23 DIAGNOSIS — Q248 Other specified congenital malformations of heart: Secondary | ICD-10-CM | POA: Diagnosis not present

## 2022-04-23 DIAGNOSIS — E7800 Pure hypercholesterolemia, unspecified: Secondary | ICD-10-CM

## 2022-04-23 DIAGNOSIS — I7121 Aneurysm of the ascending aorta, without rupture: Secondary | ICD-10-CM | POA: Diagnosis not present

## 2022-04-23 MED ORDER — CARVEDILOL 3.125 MG PO TABS: ORAL_TABLET | ORAL | 3 refills | Status: DC

## 2022-04-23 NOTE — Patient Instructions (Signed)
Medication Instructions:  Your physician recommends that you continue on your current medications as directed. Please refer to the Current Medication list given to you today.  *If you need a refill on your cardiac medications before your next appointment, please call your pharmacy*   Lab Work: NONE If you have labs (blood work) drawn today and your tests are completely normal, you will receive your results only by: MyChart Message (if you have MyChart) OR A paper copy in the mail If you have any lab test that is abnormal or we need to change your treatment, we will call you to review the results.   Testing/Procedures: NONE   Follow-Up: At [facility], you and your health needs are our priority.  As part of our continuing mission to provide you with exceptional heart care, we have created designated Provider Care Teams.  These Care Teams include your primary Cardiologist (physician) and Advanced Practice Providers (APPs -  Physician Assistants and Nurse Practitioners) who all work together to provide you with the care you need, when you need it.  We recommend signing up for the patient portal called "MyChart".  Sign up information is provided on this After Visit Summary.  MyChart is used to connect with patients for Virtual Visits (Telemedicine).  Patients are able to view lab/test results, encounter notes, upcoming appointments, etc.  Non-urgent messages can be sent to your provider as well.   To learn more about what you can do with MyChart, go to [email or web address].    Your next appointment:   1 year(s)  The format for your next appointment:   In Person  Provider:   Mihai Croitoru, MD   

## 2022-04-23 NOTE — Progress Notes (Signed)
Cardiology Office Note   Date:  04/23/2022   ID:  Desiree, [name] [birth year], MRN [id number]  PCP:  [name], MD  Cardiologist:  [name], MD Electrophysiologist:  None   Chief Complaint: Aortic aneurysm, HTN  History of Present Illness:    Desiree [name] is a 76 y.o. 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  She denies any serious medical problems since her last appointment.  Her creatinine was a little bit higher at 1.57 on labs checked in July (previously 1 3 in January 2023, 1.0-1.05 over the last couple of years..  Dr. Theador Hawthorne reduced her dose of losartan.  Her blood pressure remains normal.  She has occasional problems with left-sided chest pain that is musculoskeletal in pattern.  She denies problems with shortness of breath either at rest or activity.  She does not have edema, orthopnea or PND.  She has not had dizziness syncope, palpitations, claudication or focal neurological events.  She continues walking on a regular basis and her activity level is similar to a year ago.  CT angiogram performed 10/01/2021 shows unchanged ascending aortic diameter at 4.2 cm, essentially stable over the last 14 years.  She has a known dynamic systolic ejection murmur related to LV outflow tract obstruction, without the full phenotypic manifestations of hypertrophic cardiomyopathy.  On physical exam her murmur seems unchanged today.   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--  S/P  RIGHT MASTECTMOY AND RADIATION--  NO RECURRANCE   History of CVA (cerebrovascular accident) PER SCAN IN 2011   Hyperlipidemia    Hypertension    Impaired fasting glucose PER PCP  DR Moshe Cipro   WATCH DIET   Mixed urge and stress incontinence    Nocturia    Sinus drainage    Thoracic ascending aortic aneurysm ([place]) LAST CHEST CT 09-02-2010--  FOLLOWED BY  CARDIOLOGIST--  DR [id number]   Vaginal wall prolapse    Past Surgical History:  Procedure Laterality Date   ABDOMINAL HYSTERECTOMY     BILATERAL CARPAL TUNNEL RELEASE  1980'S   CARDIAC CATHETERIZATION  02-01-2009  DR [place]   NORMAL CORONARY ARTERIES/ NORMAL LV SIZE AND FUNCTION/ AORTIC ROOT SIZE AT THE UPPER LIMIT OF NORMAL    CARPAL TUNNEL RELEASE     CERVICAL [place] SURGERY  12-12-2005  DR Vertell Limber   LEFT  C6 - C7 HERINATED / DDD/ SPONDYLOSIS   COLONOSCOPY N/A 02/10/2014   Surgeon: Danie Binder, MD; Normal TI, mild diverticulosis in sigmoid and transverse colon, small external hemorrhoids, no obvious source for anemia.  Repeat colonoscopy in 10-15 years if benefits outweigh risk.   CYSTOCELE REPAIR  08/02/2012   Procedure: ANTERIOR REPAIR (CYSTOCELE);  Surgeon: Ailene Rud, MD;  Location: [facility];  Service: Urology;  Laterality: N/A;  [employer] Uphold Anterior Pelvic Floor Sacrospinus Repair. Anterior wall of the vagina.  ESOPHAGOGASTRODUODENOSCOPY N/A 02/10/2014   Surgeon: Danie Binder, MD;   Mild gastritis/duodenitis s/p biopsied, no obvious source for anemia.  Pathology with mild chronic gastritis with intestinal metaplasia, negative for H. pylori, duodenal biopsy benign.   GIVENS CAPSULE STUDY N/A 03/13/2014   Surgeon: Danie Binder, MD;  Normal exam.  Suspected anemia most likely due to chronic disease.   JOINT REPLACEMENT N/A    [employer] 06/16/2020    NECK SURGERY  2007   RIGHT PARTIAL MASTECTOMY  11-11-2004  DR PETER YOUNG   DUCTAL CARCINOMA IN SITU RIGHT BREAST   TOTAL ABDOMINAL HYSTERECTOMY W/ BILATERAL SALPINGOOPHORECTOMY  1990   TOTAL KNEE ARTHROPLASTY Right 12/21/2019   Procedure: RIGHT  TOTAL KNEE ARTHROPLASTY;  Surgeon: [name], MD;  Location: [place];  Service: Orthopedics;  Laterality: Right;   TRANSTHORACIC ECHOCARDIOGRAM  03-30-2008  DR MARGARET SIMPSON   LV SIZE AND FUNCTION NORMAL/ MODERATE AORTIC ARCH DILATATION/ MILD MR     Current Meds  Medication Sig   acetaminophen (TYLENOL) 500 MG tablet Take one to  two tablets by mouth at night for pain   Ascorbic Acid (VITAMIN C) 1000 MG tablet Take 1,000 mg by mouth daily.   aspirin EC 325 MG tablet Take 1 tablet (325 mg total) by mouth daily.   chlorthalidone (HYGROTON) 50 MG tablet Take 1 tablet (50 mg total) by mouth daily.   Cholecalciferol (VITAMIN D) 50 MCG (2000 UT) tablet Take 2,000 Units by mouth daily.    cycloSPORINE (RESTASIS) 0.05 % ophthalmic emulsion 1 drop 2 (two) times daily.   Ferrous Sulfate (SLOW FE PO) Take 1 capsule by mouth every other day.   gabapentin (NEURONTIN) 100 MG capsule TAKE 2 CAPSULES BY MOUTH ONCE DAILY AT BEDTIME   hydrochlorothiazide (HYDRODIURIL) 25 MG tablet Take 1 tablet (25 mg total) by mouth daily.   losartan (COZAAR) 100 MG tablet Take 1 tablet (100 mg total) by mouth daily.   losartan (COZAAR) 100 MG tablet Take 50 mg by mouth daily.   lovastatin (MEVACOR) 40 MG tablet Take 1 tablet by mouth once daily   montelukast (SINGULAIR) 10 MG tablet TAKE 1 TABLET BY MOUTH AT BEDTIME   Multiple Vitamin (MULTIVITAMIN) tablet Take 1 tablet by mouth daily. One a day woman's   potassium chloride (KLOR-CON) 10 MEQ tablet Take 1 tablet (10 mEq total) by mouth daily.   zinc gluconate 50 MG tablet Take 50 mg by mouth daily.   [DISCONTINUED] carvedilol (COREG) 3.125 MG tablet TAKE 1 TABLET(3.125 MG) BY MOUTH TWICE DAILY WITH A MEAL     Allergies:    Codeine and Quinolones   Social History   Tobacco Use   Smoking status: Never   Smokeless tobacco: Never  Vaping Use   Vaping Use: Never used  Substance Use Topics   Alcohol use: No   Drug use: No     Family Hx: The patient's family history includes Breast cancer in her sister; Heart attack (age of onset: 86) in her mother; Hypertension in her brother and mother; Kidney failure in her mother; Lung cancer (age of onset: 29) in her father; Prostate cancer in her father; Throat cancer in her father. There is no history of Colon cancer or Colon polyps.  ROS:   Please see the history of present illness.    All other systems are reviewed and are negative Prior CV studies:   The following studies were reviewed today:  CT angiogram of the aorta 08/09/2020 1. Stable 4.1 cm ascending  thoracic aortic diameter. Recommend annual imaging followup by CTA or MRA. This recommendation follows 2010 ACCF/AHA/AATS/ACR/ASA/SCA/SCAI/SIR/STS/SVM Guidelines for the Diagnosis and Management of Patients with Thoracic Aortic Disease. Circulation. 2010; 121: B716-R678. Aortic aneurysm NOS (ICD10-I71.9) . 2. Otherwise stable exam without acute findings.   Echocardiogram January 2020 - Left ventricle: Turbulent flow through LVOT No obstruction. The    cavity size was normal. Wall thickness was increased in a pattern    of mild LVH. Systolic function was vigorous. The estimated    ejection fraction was in the range of 65% to 70%.  - Aortic valve: Valve area (VTI): 2.02 cm^2. Valve area (Vmax):    2.08 cm^2. Valve area (Vmean): 2.17 cm^2.    Labs/Other Tests and Data Reviewed:    EKG: Ordered today and personally reviewed shows normal sinus rhythm, normal tracing.  QTc 426 ms.  Recent Labs: 06/12/2021: Hemoglobin 11.5; Platelets 309 09/18/2021: ALT 10 03/18/2022: BUN 30; Creatinine, Ser 1.57; Potassium 4.7; Sodium 142   Recent Lipid Panel Lab Results  Component Value Date/Time   CHOL 176  09/18/2021 10:14 AM   TRIG 44 09/18/2021 10:14 AM   HDL 76 09/18/2021 10:14 AM   CHOLHDL 2.3 09/18/2021 10:14 AM   CHOLHDL 2.3 05/11/2020 10:41 AM   LDLCALC 91 09/18/2021 10:14 AM   LDLCALC 94 05/11/2020 10:41 AM    Wt Readings from Last 3 Encounters:  04/23/22 192 lb 12.8 oz (87.5 kg)  03/19/22 191 lb 6.4 oz (86.8 kg)  03/12/22 190 lb (86.2 kg)     Objective:    Vital Signs:  BP 132/80 (BP Location: Left Arm, Patient Position: Sitting, Cuff Size: Large)   Pulse 60   Ht '5\' 1"'$  (1.549 m)   Wt 192 lb 12.8 oz (87.5 kg)   SpO2 96%   BMI 36.43 kg/m      General: Alert, oriented x3, no distress, moderately obese Head: no evidence of trauma, PERRL, EOMI, no exophtalmos or lid lag, no myxedema, no xanthelasma; normal ears, nose and oropharynx Neck: normal jugular venous pulsations and no hepatojugular reflux; brisk carotid pulses without delay and no carotid bruits Chest: clear to auscultation, no signs of consolidation by percussion or palpation, normal fremitus, symmetrical and full respiratory excursions Cardiovascular: normal position and quality of the apical impulse, regular rhythm, normal first and second heart sounds, unchanged 1-2/6 aortic ejection murmur at the right upper sternal border, no diastolic murmurs, rubs or gallops Abdomen: no tenderness or distention, no masses by palpation, no abnormal pulsatility or arterial bruits, normal bowel sounds, no hepatosplenomegaly Extremities: no clubbing, cyanosis or edema; 2+ radial, ulnar and brachial pulses bilaterally; 2+ right femoral, posterior tibial and dorsalis pedis pulses; 2+ left femoral, posterior tibial and dorsalis pedis pulses; no subclavian or femoral bruits Neurological: grossly nonfocal Psych: Normal mood and affect     ASSESSMENT & PLAN:    1. Aneurysm of ascending aorta without rupture ([place])   2. Left ventricular outflow obstruction   3. Essential hypertension   4. Hypercholesterolemia   5. Stage 3a chronic  kidney disease (HCC)       Asc Ao Aneurysm: Unchanged diameter for over 10 years.  I would suggest increasing the period between surveillance CTs to every 24 months especially since she has some degree of renal dysfunction. LVOT obstruction: This has never been symptomatic and whenever evaluated has been mild.  Would like to keep her on a small dose of beta-blocker as long as her blood pressure allows. HTN: Well-controlled.  Best choices  are beta-blocker and ARB since she has LV outflow obstruction and an aneurysm.  Dose of ARB was reduced by her nephrologist last month.  She has repeat labs with him later this month. HLP: All lipid parameters are in desirable range. CKD: Creatinine has waxed and waned over the years.  Most recently her baseline creatinine seems to be at about 1.0-1.05, but her creatinine was hovering around 1.2 in 2014-2015.  Renal ultrasound in 2021 showed normal kidney sizes with slightly increased cortical echogenicity.  If her creatinine has not improved, would suggest reducing her chlorthalidone dose and may be starting her back on a calcium channel blocker like amlodipine.   Patient Instructions  Medication Instructions:  Your physician recommends that you continue on your current medications as directed. Please refer to the Current Medication list given to you today.  *If you need a refill on your cardiac medications before your next appointment, please call your pharmacy*   Lab Work: NONE If you have labs (blood work) drawn today and your tests are completely normal, you will receive your results only by: [place] (if you have MyChart) OR A paper copy in the mail If you have any lab test that is abnormal or we need to change your treatment, we will call you to review the results.   Testing/Procedures: NONE   Follow-Up: At [facility], you and your health needs are our priority.  As part of our continuing mission to provide you with exceptional  heart care, we have created designated Provider Care Teams.  These Care Teams include your primary Cardiologist (physician) and Advanced Practice Providers (APPs -  Physician Assistants and Nurse Practitioners) who all work together to provide you with the care you need, when you need it.  We recommend signing up for the patient portal called "MyChart".  Sign up information is provided on this After Visit Summary.  MyChart is used to connect with patients for Virtual Visits (Telemedicine).  Patients are able to view lab/test results, encounter notes, upcoming appointments, etc.  Non-urgent messages can be sent to your provider as well.   To learn more about what you can do with MyChart, go to [email or web address].    Your next appointment:   1 year(s)  The format for your next appointment:   In Person  Provider:   [name], MD      Signed, [name], MD  04/23/2022 1:46 PM    [place]

## 2022-04-24 DIAGNOSIS — I129 Hypertensive chronic kidney disease with stage 1 through stage 4 chronic kidney disease, or unspecified chronic kidney disease: Secondary | ICD-10-CM | POA: Diagnosis not present

## 2022-04-24 DIAGNOSIS — N1832 Chronic kidney disease, stage 3b: Secondary | ICD-10-CM | POA: Diagnosis not present

## 2022-04-24 DIAGNOSIS — R809 Proteinuria, unspecified: Secondary | ICD-10-CM | POA: Diagnosis not present

## 2022-04-24 DIAGNOSIS — N170 Acute kidney failure with tubular necrosis: Secondary | ICD-10-CM | POA: Diagnosis not present

## 2022-04-24 DIAGNOSIS — N189 Chronic kidney disease, unspecified: Secondary | ICD-10-CM | POA: Diagnosis not present

## 2022-04-24 DIAGNOSIS — D631 Anemia in chronic kidney disease: Secondary | ICD-10-CM | POA: Diagnosis not present

## 2022-04-28 NOTE — Therapy (Signed)
OUTPATIENT PHYSICAL THERAPY CERVICAL EVALUATION   Patient Name: Desiree [name] MRN: [id number] DOB:02/10/1946, 76 y.o., female Today's Date: 04/30/2022   PT End of Session - 04/30/22 0956     Visit Number 1    Number of Visits 6    Date for PT Re-Evaluation 06/11/22    Authorization Type [employer]/ Medicaid of [place]; no VL; no auth    Progress Note Due on Visit 10    PT Start Time 0950    PT Stop Time 1029    PT Time Calculation (min) 39 min             Past Medical History:  Diagnosis Date   Allergic rhinitis    Arthritis HIPS/ WRISTS   Cancer ([place])    DDD (degenerative disc disease), cervical    GERD (gastroesophageal reflux disease) OCCASIONAL   Heart murmur    turbulent flow through LVOT, no obstruction 08/26/18 echo   History of Bell's palsy 2011  RIGHT SIDE -- RESOLVED   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--  S/P  RIGHT MASTECTMOY AND RADIATION--  NO RECURRANCE   History of CVA (cerebrovascular accident) PER SCAN IN 2011   Hyperlipidemia    Hypertension    Impaired fasting glucose PER PCP  DR Moshe Cipro   WATCH DIET   Mixed urge and stress incontinence    Nocturia    Sinus drainage    Thoracic ascending aortic aneurysm ([place]) LAST CHEST CT 09-02-2010--  FOLLOWED BY  CARDIOLOGIST--  DR FGHWEXHB   Vaginal wall prolapse    Past Surgical History:  Procedure Laterality Date   ABDOMINAL HYSTERECTOMY     BILATERAL CARPAL TUNNEL RELEASE  1980'S   CARDIAC CATHETERIZATION  02-01-2009  DR [place]   NORMAL CORONARY ARTERIES/ NORMAL LV SIZE AND FUNCTION/ AORTIC ROOT SIZE AT THE UPPER LIMIT OF NORMAL    CARPAL TUNNEL RELEASE     CERVICAL [place] SURGERY  12-12-2005  DR Vertell Limber   LEFT  C6 - C7 HERINATED / DDD/ SPONDYLOSIS   COLONOSCOPY N/A 02/10/2014   Surgeon: Danie Binder, MD; Normal TI, mild diverticulosis in sigmoid and transverse colon, small external hemorrhoids, no obvious source for anemia.  Repeat colonoscopy in 10-15 years if benefits outweigh  risk.   CYSTOCELE REPAIR  08/02/2012   Procedure: ANTERIOR REPAIR (CYSTOCELE);  Surgeon: Ailene Rud, MD;  Location: [facility];  Service: Urology;  Laterality: N/A;  [employer] Uphold Anterior Pelvic Floor Sacrospinus Repair. Anterior wall of the vagina.   ESOPHAGOGASTRODUODENOSCOPY N/A 02/10/2014   Surgeon: Danie Binder, MD;   Mild gastritis/duodenitis s/p biopsied, no obvious source for anemia.  Pathology with mild chronic gastritis with intestinal metaplasia, negative for H. pylori, duodenal biopsy benign.   GIVENS CAPSULE STUDY N/A 03/13/2014   Surgeon: Danie Binder, MD;  Normal exam.  Suspected anemia most likely due to chronic disease.   JOINT REPLACEMENT N/A    Phreesia 06/16/2020   NECK SURGERY  2007   RIGHT PARTIAL MASTECTOMY  11-11-2004  DR PETER YOUNG   DUCTAL CARCINOMA IN SITU RIGHT BREAST   TOTAL ABDOMINAL HYSTERECTOMY W/ BILATERAL SALPINGOOPHORECTOMY  1990   TOTAL KNEE ARTHROPLASTY Right 12/21/2019   Procedure: RIGHT  TOTAL KNEE ARTHROPLASTY;  Surgeon: [name], MD;  Location: [facility];  Service: Orthopedics;  Laterality: Right;   TRANSTHORACIC ECHOCARDIOGRAM  03-30-2008  DR MARGARET SIMPSON   LV SIZE AND FUNCTION NORMAL/ MODERATE AORTIC ARCH DILATATION/ MILD MR   Patient  Active Problem List   Diagnosis Date Noted   Stage 3b chronic kidney disease (CKD) ([place]) 03/20/2022   AKI (acute kidney injury) ([place]) 03/19/2022   Iron deficiency anemia 01/30/2022   Infected tooth 09/11/2021   Vertigo 07/10/2021   Unilateral primary osteoarthritis, left knee 09/13/2020   S/P TKR (total knee replacement), right 12/21/2019   Fatigue 10/18/2019   Left shoulder pain 04/11/2019   Ganglion cyst of volar aspect of left wrist 10/05/2018   Allergic cough 05/14/2016   Neck pain on left side 01/01/2016   Left ventricular outflow tract obstruction 84/16/6063   Metabolic syndrome X 01/60/1093   Osteopenia 12/12/2013   Anemia 11/29/2013   Morbid obesity  ([place]) 10/17/2012   Aneurysm of thoracic aorta ([place]) 10/12/2012   Allergic rhinitis 03/02/2012   Disorder of bladder 09/24/2010   Malignant neoplasm of female breast ([place]) 02/04/2008   Hyperlipidemia 02/04/2008   Essential hypertension 02/04/2008    PCP: [name], MD  REFERRING PROVIDER: PT eval/tx for M54.2 cervicalgia per [name], DO needs dry needling  REFERRING DIAG: PT eval/tx for M54.2 cervicalgia per [name], DO needs dry needling   THERAPY DIAG:  Neck pain - Plan: PT plan of care cert/re-cert  Rationale for Evaluation and Treatment Rehabilitation  ONSET DATE: several months  SUBJECTIVE:                                                                                                                                                                                                         SUBJECTIVE STATEMENT: Neck has been hurting several months; left shoulder for several years and getting worse; also seeing OT today.  Left side of neck and left shoulder is the primary place she is hurting.   PERTINENT HISTORY:  Neck surgery 2007; discectomy per Dr. Vertell Limber CPT bilaterally  PAIN:  Are you having pain? Yes: NPRS scale: 6/10 Pain location: left side of neck Pain description: tight and achey Aggravating factors: unknown Relieving factors: Tylenol  PRECAUTIONS: None  WEIGHT BEARING RESTRICTIONS No  FALLS:  Has patient fallen in last 6 months? No  LIVING ENVIRONMENT: Lives with: lives alone Lives in: House/apartment Stairs: Yes: External: 3 steps; on right going up, on left going up, and can reach both Has following equipment at home: Single point cane and [place] - 2 wheeled  OCCUPATION: retired  PLOF: Independent  PATIENT GOALS less pain in my neck  OBJECTIVE:   DIAGNOSTIC FINDINGS:  CLINICAL DATA:  Neck pain with cervical spondylosis and disc disease with worsening pain for 6 months. Left arm  pain with limited range of motion.    EXAM: MRI CERVICAL SPINE WITHOUT AND WITH CONTRAST   TECHNIQUE: Multiplanar and multiecho pulse sequences of the cervical spine, to include the craniocervical junction and cervicothoracic junction, were obtained without and with intravenous contrast.   CONTRAST:  19m GADAVIST GADOBUTROL 1 MMOL/ML IV SOLN   COMPARISON:  Cervical spine MRI 10/02/2014   FINDINGS: Alignment: There is slight reversal of the normal cervical curvature centered at C4-C5, similar to the prior study. There is no antero or retrolisthesis.   Vertebrae: Vertebral body heights are preserved. Background marrow signal is normal. There is mild degenerative endplate marrow signal abnormality at C3-C4. There is no suspicious marrow signal abnormality, marrow edema or abnormal marrow enhancement.   Cord: Normal in signal and morphology.   Posterior Fossa, vertebral arteries, paraspinal tissues: The imaged posterior fossa is unremarkable. The vertebral artery flow voids are normal. The paraspinal soft tissues are unremarkable.   Disc levels:   There is disc desiccation and narrowing at C3-C4 through C6-C7, overall slightly progressed in the interim.   C2-C3: No significant spinal canal or neural foraminal stenosis.   C3-C4: There is a shallow posterior disc osteophyte complex with bilateral uncovertebral ridging and facet arthropathy resulting in moderate right worse than left neural foraminal stenosis without significant spinal canal stenosis, progressed since 2016.   C4-C5: There is a shallow posterior disc osteophyte complex with bilateral uncovertebral ridging and mild facet arthropathy resulting in mild spinal canal stenosis with slight flattening of the ventral cord surface and moderate right worse than left neural foraminal stenosis slightly progressed since 2016   C5-C6: There is bilateral uncovertebral ridging with a shallow posterior disc osteophyte complex and mild facet arthropathy resulting  in moderate right and mild left neural foraminal stenosis and no significant spinal canal stenosis not significantly changed.   C6-C7: There is mild bilateral uncovertebral ridging resulting in mild bilateral neural foraminal stenosis without significant spinal canal stenosis   C7-T1: No significant spinal canal or neural foraminal stenosis   T1-T2: There is a small right paracentral protrusion without significant spinal canal or neural foraminal stenosis.   IMPRESSION: 1. Multilevel disc degeneration most advanced at C3-C4 through C6-C7 as above have overall slightly progressed since 2016. 2. Up to moderate right worse than left neural foraminal stenosis at C3-C4, and moderate right foraminal stenosis at C5-C6. 3. No greater than mild spinal canal stenosis at C4-C5 with slight flattening of the ventral cord surface.    PATIENT SURVEYS:  FOTO 50   COGNITION: Overall cognitive status: Within functional limits for tasks assessed   SENSATION: Sometimes N/T in left leg?  POSTURE: rounded shoulders and forward head  PALPATION: Tender and tight left upper trap and levatore   Limited left shoulder mobility  CERVICAL ROM:   Active ROM A/PROM (deg) eval  Flexion 12  Extension 20  Right lateral flexion 32  Left lateral flexion 12*  Right rotation 50  Left rotation 34*   (Blank rows = not tested)  UPPER EXTREMITY ROM:  Active ROM Right eval Left eval  Shoulder flexion 150 114  Shoulder extension    Shoulder abduction    Shoulder adduction    Shoulder extension    Shoulder internal rotation    Shoulder external rotation    Elbow flexion    Elbow extension    Wrist flexion    Wrist extension    Wrist ulnar deviation    Wrist radial deviation    Wrist pronation  Wrist supination     (Blank rows = not tested)  UPPER EXTREMITY MMT:  MMT Right eval Left eval  Shoulder flexion 4+ 4-  Shoulder extension    Shoulder abduction 4+ 4  Shoulder adduction     Shoulder extension    Shoulder internal rotation    Shoulder external rotation    Middle trapezius 5 4  Lower trapezius    Elbow flexion    Elbow extension    Wrist flexion    Wrist extension    Wrist ulnar deviation    Wrist radial deviation    Wrist pronation    Wrist supination    Grip strength 62 52   (Blank rows = not tested)     TODAY'S TREATMENT:  Physical therapy evaluation and HEP instruction   PATIENT EDUCATION:  Education details: Patient educated on exam findings, POC, scope of PT, HEP. Person educated: Patient Education method: Explanation, Demonstration, and Handouts Education comprehension: verbalized understanding, returned demonstration, verbal cues required, and tactile cues required    HOME EXERCISE PROGRAM: Access Code: [email or web address] URL: [email or web address] Date: 04/30/2022 Prepared by: [facility] - Rehab  Exercises - Supine Chin Tuck  - 2 x daily - 7 x weekly - 1 sets - 10 reps - Supine Scapular Retraction  - 2 x daily - 7 x weekly - 1 sets - 10 reps - Seated Upper Trapezius Stretch  - 2 x daily - 7 x weekly - 1 sets - 3 reps - 20" hold  ASSESSMENT:  CLINICAL IMPRESSION: Patient is a 76 y.o. lady who was seen today for physical therapy evaluation and treatment for PT eval/tx for M54.2 cervicalgia per [name]. Patient presents on evaluation with limited mobility of the cervical spine and left shoulder and weakness left upper extremity, impaired posturing, tightness and tenderness to left side of the cervical spine all that negatively impact her ability to sleep and  perform tasks with her arms in the home. Patient will benefit from continued skilled therapy services  to address deficits and promote return to optimal function.      OBJECTIVE IMPAIRMENTS decreased activity tolerance, decreased knowledge of condition, decreased mobility, decreased ROM, decreased strength, increased fascial restrictions, impaired perceived functional ability,  increased muscle spasms, impaired flexibility, impaired UE functional use, and pain.   ACTIVITY LIMITATIONS carrying, lifting, sitting, standing, sleeping, reach over head, hygiene/grooming, and caring for others  PARTICIPATION LIMITATIONS: meal prep, cleaning, laundry, driving, shopping, and community activity  PERSONAL FACTORS Past/current experiences are also affecting patient's functional outcome.   REHAB POTENTIAL: Good  CLINICAL DECISION MAKING: Stable/uncomplicated  EVALUATION COMPLEXITY: Low   GOALS: Goals reviewed with patient? No  SHORT TERM GOALS: Target date: 05/21/2022    patient will be independent with initial HEP Baseline:  Goal status: INITIAL  2.  Patient will improve left cervical rotation and side bending by 10 degrees to improve ability to scan environment for safety with driving.  Baseline: see above Goal status: INITIAL   LONG TERM GOALS: Target date: 06/11/2022   Patient will be independent with advanced HEP and self management strategies to improve quality of life and functional outcomes. Baseline:  Goal status: INITIAL  2.  Patient will improve FOTO score to predicted value to demonstrate improved functional mobility  Baseline: 50 Goal status: INITIAL  3.  Patient will report at least 50% improvement in overall symptoms and/or function to demonstrate improved functional mobility  Baseline:  Goal status: INITIAL  4.  Patient will improve left shoulder flexion in  sitting to 130 degrees to improve ability to reach overhead in kitchen cabinets.  Baseline: 115 Goal status: INITIAL  5.  Patient will improve cervical mobility by 40 degrees total to improve ability to scan environment for safety when driving. Baseline: see above Goal status: INITIAL     PLAN: PT FREQUENCY: 1x/week  PT DURATION: 6 weeks  PLANNED INTERVENTIONS: Therapeutic exercises, Therapeutic activity, Neuromuscular re-education, Balance training, Gait training,  Patient/Family education, Joint manipulation, Joint mobilization, Stair training, Orthotic/Fit training, DME instructions, Aquatic Therapy, Dry Needling, Electrical stimulation, Spinal manipulation, Spinal mobilization, Cryotherapy, Moist heat, Compression bandaging, scar mobilization, Splintting, Taping, Traction, Ultrasound, Ionotophoresis '4mg'$ /ml Dexamethasone, and Manual therapy  PLAN FOR NEXT SESSION: Review HEP and goals   10:55 AM, 04/30/22 Danetta Prom Small Karel Turpen MPT [facility] [place] [phone] [phone]

## 2022-04-30 ENCOUNTER — Ambulatory Visit (HOSPITAL_COMMUNITY): Payer: PPO | Attending: Neurological Surgery

## 2022-04-30 ENCOUNTER — Encounter (HOSPITAL_COMMUNITY): Payer: Self-pay

## 2022-04-30 ENCOUNTER — Ambulatory Visit (HOSPITAL_COMMUNITY): Payer: PPO

## 2022-04-30 DIAGNOSIS — M542 Cervicalgia: Secondary | ICD-10-CM | POA: Insufficient documentation

## 2022-04-30 DIAGNOSIS — G8929 Other chronic pain: Secondary | ICD-10-CM | POA: Diagnosis not present

## 2022-04-30 DIAGNOSIS — R29898 Other symptoms and signs involving the musculoskeletal system: Secondary | ICD-10-CM

## 2022-04-30 DIAGNOSIS — M25512 Pain in left shoulder: Secondary | ICD-10-CM | POA: Insufficient documentation

## 2022-04-30 NOTE — Therapy (Signed)
OUTPATIENT OCCUPATIONAL THERAPY ORTHO EVALUATION  Patient Name: Desiree [name] MRN: [id number] DOB:09/29/1945, 76 y.o., female Today's Date: 04/30/2022  PCP: Tula Nakayama, MD REFERRING PROVIDER: Tania Ade, MD   OT End of Session - 04/30/22 1303     Visit Number 1    Number of Visits 6    Date for OT Re-Evaluation 06/11/22    Authorization Type 1) [employer]    2) Medicaid of [place]    Authorization Time Period no auth, no visit limit    OT Start Time 1026    OT Stop Time 1051    OT Time Calculation (min) 25 min    Activity Tolerance Patient tolerated treatment well    Behavior During Therapy WFL for tasks assessed/performed             Past Medical History:  Diagnosis Date   Allergic rhinitis    Arthritis HIPS/ WRISTS   Cancer ([place])    DDD (degenerative disc disease), cervical    GERD (gastroesophageal reflux disease) OCCASIONAL   Heart murmur    turbulent flow through LVOT, no obstruction 08/26/18 echo   History of Bell's palsy 2011  RIGHT SIDE -- RESOLVED   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--  S/P  RIGHT MASTECTMOY AND RADIATION--  NO RECURRANCE   History of CVA (cerebrovascular accident) PER SCAN IN 2011   Hyperlipidemia    Hypertension    Impaired fasting glucose PER PCP  DR Moshe Cipro   WATCH DIET   Mixed urge and stress incontinence    Nocturia    Sinus drainage    Thoracic ascending aortic aneurysm ([place]) LAST CHEST CT 09-02-2010--  FOLLOWED BY  CARDIOLOGIST--  DR GGYIRSWN   Vaginal wall prolapse    Past Surgical History:  Procedure Laterality Date   ABDOMINAL HYSTERECTOMY     BILATERAL CARPAL TUNNEL RELEASE  1980'S   CARDIAC CATHETERIZATION  02-01-2009  DR [place]   NORMAL CORONARY ARTERIES/ NORMAL LV SIZE AND FUNCTION/ AORTIC ROOT SIZE AT THE UPPER LIMIT OF NORMAL    CARPAL TUNNEL RELEASE     CERVICAL [place] SURGERY  12-12-2005  DR Vertell Limber   LEFT  C6 - C7 HERINATED / DDD/ SPONDYLOSIS   COLONOSCOPY N/A 02/10/2014   Surgeon:  Danie Binder, MD; Normal TI, mild diverticulosis in sigmoid and transverse colon, small external hemorrhoids, no obvious source for anemia.  Repeat colonoscopy in 10-15 years if benefits outweigh risk.   CYSTOCELE REPAIR  08/02/2012   Procedure: ANTERIOR REPAIR (CYSTOCELE);  Surgeon: Ailene Rud, MD;  Location[facility];  Service: Urology;  Laterality: N/A;  [employer] Uphold Anterior Pelvic Floor Sacrospinus Repair. Anterior wall of the vagina.   ESOPHAGOGASTRODUODENOSCOPY N/A 02/10/2014   Surgeon: Danie Binder, MD;   Mild gastritis/duodenitis s/p biopsied, no obvious source for anemia.  Pathology with mild chronic gastritis with intestinal metaplasia, negative for H. pylori, duodenal biopsy benign.   GIVENS CAPSULE STUDY N/A 03/13/2014   Surgeon: Danie Binder, MD;  Normal exam.  Suspected anemia most likely due to chronic disease.   JOINT REPLACEMENT N/A    Phreesia 06/16/2020   NECK SURGERY  2007   RIGHT PARTIAL MASTECTOMY  11-11-2004  DR PETER YOUNG   DUCTAL CARCINOMA IN SITU RIGHT BREAST   TOTAL ABDOMINAL HYSTERECTOMY W/ BILATERAL SALPINGOOPHORECTOMY  1990   TOTAL KNEE ARTHROPLASTY Right 12/21/2019   Procedure: RIGHT  TOTAL KNEE ARTHROPLASTY;  Surgeon: [name], MD;  Location: [facility];  Service: Orthopedics;  Laterality: Right;   TRANSTHORACIC ECHOCARDIOGRAM  03-30-2008  DR MARGARET SIMPSON   LV SIZE AND FUNCTION NORMAL/ MODERATE AORTIC ARCH DILATATION/ MILD MR   Patient Active Problem List   Diagnosis Date Noted   Stage 3b chronic kidney disease (CKD) ([place]) 03/20/2022   AKI (acute kidney injury) ([place]) 03/19/2022   Iron deficiency anemia 01/30/2022   Infected tooth 09/11/2021   Vertigo 07/10/2021   Unilateral primary osteoarthritis, left knee 09/13/2020   S/P TKR (total knee replacement), right 12/21/2019   Fatigue 10/18/2019   Left shoulder pain 04/11/2019   Ganglion cyst of volar aspect of left wrist 10/05/2018   Allergic cough  05/14/2016   Neck pain on left side 01/01/2016   Left ventricular outflow tract obstruction 56/38/7564   Metabolic syndrome X 33/29/5188   Osteopenia 12/12/2013   Anemia 11/29/2013   Morbid obesity ([place]) 10/17/2012   Aneurysm of thoracic aorta ([place]) 10/12/2012   Allergic rhinitis 03/02/2012   Disorder of bladder 09/24/2010   Malignant neoplasm of female breast ([place]) 02/04/2008   Hyperlipidemia 02/04/2008   Essential hypertension 02/04/2008    ONSET DATE: Ongoing pain for 3+ years  REFERRING DIAG: shoulder glenohumeral osteoarthritis   THERAPY DIAG:  Chronic left shoulder pain  Other symptoms and signs involving the musculoskeletal system  Rationale for Evaluation and Treatment Rehabilitation  SUBJECTIVE:   SUBJECTIVE STATEMENT: S: "This has been going on for years." Pt accompanied by: self  PERTINENT HISTORY: Pt reports that she has been having left shoulder pain for several years. It  has recently gotten worse, with x-rays indicating osteoarthritis. Pt received a steroid injection last month with no relief. Pt was referred by Tania Ade, MD for an OT evaluation and treatment.   PRECAUTIONS: None  WEIGHT BEARING RESTRICTIONS No  PAIN:  Are you having pain? Yes: NPRS scale: 7/10 Pain location: Entire shoulder and proximal arm Pain description: throbbing pain  Aggravating factors: sleeping on the shoulder, reaching up  Relieving factors: tylenol   FALLS: Has patient fallen in last 6 months? No  LIVING ENVIRONMENT: Lives with: lives alone Lives in: House/apartment   PLOF: Independent  PATIENT GOALS "Less pain."  OBJECTIVE:   HAND DOMINANCE: Right  ADLs: Overall ADLs: Pt reports being independent with ADLs, however, tasks such as dressing cause increased pain. She reports being unable to lift heavy objects and place objects on a overhead shelves.    FUNCTIONAL OUTCOME MEASURES: FOTO: 48.43  UPPER EXTREMITY ROM     Active ROM Left eval  Shoulder  flexion 102  Shoulder abduction 100  Shoulder external rotation 22  (Blank rows = not tested)  Passive ROM Left eval  Shoulder flexion 98  Shoulder abduction 103  Shoulder external rotation 30  (Blank rows = not tested)   UPPER EXTREMITY MMT:     MMT Left eval  Shoulder flexion 3+/5  Shoulder abduction 3/5  Shoulder internal rotation 4-/5  Shoulder external rotation 3/5  (Blank rows = not tested)  HAND FUNCTION: Grip strength: Right: 62 lbs; Left: 52 lbs   COGNITION: Overall cognitive status: Within functional limits for tasks assessed   OBSERVATIONS: Fascial restrictions throughout upper quadrant-trapezius, axillary region, deltoids    TODAY'S TREATMENT:  Evaluation    PATIENT EDUCATION: Education details: Role of OT, plan of care Person educated: Patient Education method: Explanation Education comprehension: verbalized understanding   HOME EXERCISE PROGRAM: Will establish with initial treatment session   GOALS: Goals reviewed with patient? Yes  SHORT TERM GOALS: Target date:  06/11/22     Pt will be provided with and educated on HEP to improve mobility in LUE required for ADL completion.   Goal status: INITIAL  2.  Pt will decrease pain in LUE to 3/10 or less in order to sleep through the night without waking due to pain.   Goal status: INITIAL  3.  Pt will decrease LUE fascial restrictions to minimal amounts or less to improve mobility required for overhead reaching tasks during meal preparation.   Goal status: INITIAL  4.  Pt will increase A/ROM of LUE to [facility] to improve ability to reach overhead and behind back during dressing and bathing tasks.   Goal status: INITIAL  5.  Pt will increase strength in LUE to 4/5 to improve ability to perform lifting tasks required for carrying groceries and putting them over in upper cabinets.    Goal status: INITIAL    ASSESSMENT:  CLINICAL IMPRESSION: Patient is a 76 y.o. female who was seen today  for occupational therapy evaluation for chronic left shoulder pain. She presents today with decreased ROM and strength, as well as increased UE fascial restrictions and pain limiting her ability to complete ADLs independently without significant pain. Pt would benefit from skilled OT services to address current deficits and decrease pain with ADL completion.   PERFORMANCE DEFICITS in functional skills including ADLs, IADLs, ROM, strength, pain, fascial restrictions, muscle spasms, body mechanics, and UE functional use  IMPAIRMENTS are limiting patient from ADLs, IADLs, rest and sleep, leisure, and social participation.   COMORBIDITIES may have co-morbidities  that affects occupational performance. Patient will benefit from skilled OT to address above impairments and improve overall function.  MODIFICATION OR ASSISTANCE TO COMPLETE EVALUATION: No modification of tasks or assist necessary to complete an evaluation.  OT OCCUPATIONAL PROFILE AND HISTORY: Problem focused assessment: Including review of records relating to presenting problem.  CLINICAL DECISION MAKING: LOW - limited treatment options, no task modification necessary  REHAB POTENTIAL: Good  EVALUATION COMPLEXITY: Low      PLAN: OT FREQUENCY: 1x/week  OT DURATION: 6 weeks  PLANNED INTERVENTIONS: self care/ADL training, therapeutic exercise, therapeutic activity, manual therapy, passive range of motion, electrical stimulation, ultrasound, moist heat, cryotherapy, patient/family education, cognitive remediation/compensation, energy conservation, coping strategies training, and DME and/or AE instructions  CONSULTED AND AGREED WITH PLAN OF CARE: Patient  PLAN FOR NEXT SESSION: P/ROM, AA/ROM, A/ROM, manual, shoulder strengthening    Flonnie Hailstone, [place], OTR/L [phone]  04/30/2022, 1:26 PM

## 2022-04-30 NOTE — Patient Instructions (Signed)

## 2022-05-06 ENCOUNTER — Ambulatory Visit (HOSPITAL_COMMUNITY): Payer: PPO

## 2022-05-06 DIAGNOSIS — G8929 Other chronic pain: Secondary | ICD-10-CM

## 2022-05-06 DIAGNOSIS — M542 Cervicalgia: Secondary | ICD-10-CM | POA: Diagnosis not present

## 2022-05-06 DIAGNOSIS — R29898 Other symptoms and signs involving the musculoskeletal system: Secondary | ICD-10-CM

## 2022-05-06 NOTE — Therapy (Signed)
OUTPATIENT PHYSICAL THERAPY CERVICAL TREATMENT   Patient Name: Desiree [name] MRN: [id number] DOB:[birth year], 76 y.o., female Today's Date: 05/06/2022   PT End of Session - 05/06/22 0950     Visit Number 2    Number of Visits 6    Date for PT Re-Evaluation 06/11/22    Authorization Type [employer]/ Medicaid of [place]; no VL; no auth    Progress Note Due on Visit 10    PT Start Time 0949    PT Stop Time 1029    PT Time Calculation (min) 40 min              Past Medical History:  Diagnosis Date   Allergic rhinitis    Arthritis HIPS/ WRISTS   Cancer ([place])    DDD (degenerative disc disease), cervical    GERD (gastroesophageal reflux disease) OCCASIONAL   Heart murmur    turbulent flow through LVOT, no obstruction 08/26/18 echo   History of Bell's palsy 2011  RIGHT SIDE -- RESOLVED   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--  S/P  RIGHT MASTECTMOY AND RADIATION--  NO RECURRANCE   History of CVA (cerebrovascular accident) PER SCAN IN 2011   Hyperlipidemia    Hypertension    Impaired fasting glucose PER PCP  DR Moshe Cipro   WATCH DIET   Mixed urge and stress incontinence    Nocturia    Sinus drainage    Thoracic ascending aortic aneurysm ([place]) LAST CHEST CT 09-02-2010--  FOLLOWED BY  CARDIOLOGIST--  DR [id number]   Vaginal wall prolapse    Past Surgical History:  Procedure Laterality Date   ABDOMINAL HYSTERECTOMY     BILATERAL CARPAL TUNNEL RELEASE  1980'S   CARDIAC CATHETERIZATION  02-01-2009  DR [place]   NORMAL CORONARY ARTERIES/ NORMAL LV SIZE AND FUNCTION/ AORTIC ROOT SIZE AT THE UPPER LIMIT OF NORMAL    CARPAL TUNNEL RELEASE     CERVICAL [place] SURGERY  12-12-2005  DR Vertell Limber   LEFT  C6 - C7 HERINATED / DDD/ SPONDYLOSIS   COLONOSCOPY N/A 02/10/2014   Surgeon: Danie Binder, MD; Normal TI, mild diverticulosis in sigmoid and transverse colon, small external hemorrhoids, no obvious source for anemia.  Repeat colonoscopy in 10-15 years if benefits  outweigh risk.   CYSTOCELE REPAIR  08/02/2012   Procedure: ANTERIOR REPAIR (CYSTOCELE);  Surgeon: Ailene Rud, MD;  Location: [facility];  Service: Urology;  Laterality: N/A;  [employer] Uphold Anterior Pelvic Floor Sacrospinus Repair. Anterior wall of the vagina.   ESOPHAGOGASTRODUODENOSCOPY N/A 02/10/2014   Surgeon: Danie Binder, MD;   Mild gastritis/duodenitis s/p biopsied, no obvious source for anemia.  Pathology with mild chronic gastritis with intestinal metaplasia, negative for H. pylori, duodenal biopsy benign.   GIVENS CAPSULE STUDY N/A 03/13/2014   Surgeon: Danie Binder, MD;  Normal exam.  Suspected anemia most likely due to chronic disease.   JOINT REPLACEMENT N/A    [employer] 06/16/2020   NECK SURGERY  2007   RIGHT PARTIAL MASTECTOMY  11-11-2004  DR PETER YOUNG   DUCTAL CARCINOMA IN SITU RIGHT BREAST   TOTAL ABDOMINAL HYSTERECTOMY W/ BILATERAL SALPINGOOPHORECTOMY  1990   TOTAL KNEE ARTHROPLASTY Right 12/21/2019   Procedure: RIGHT  TOTAL KNEE ARTHROPLASTY;  Surgeon: [name], MD;  Location: [facility];  Service: Orthopedics;  Laterality: Right;   TRANSTHORACIC ECHOCARDIOGRAM  03-30-2008  DR MARGARET SIMPSON   LV SIZE AND FUNCTION NORMAL/ MODERATE AORTIC ARCH DILATATION/ MILD MR  Patient Active Problem List   Diagnosis Date Noted   Stage 3b chronic kidney disease (CKD) ([place]) 03/20/2022   AKI (acute kidney injury) ([place]) 03/19/2022   Iron deficiency anemia 01/30/2022   Infected tooth 09/11/2021   Vertigo 07/10/2021   Unilateral primary osteoarthritis, left knee 09/13/2020   S/P TKR (total knee replacement), right 12/21/2019   Fatigue 10/18/2019   Left shoulder pain 04/11/2019   Ganglion cyst of volar aspect of left wrist 10/05/2018   Allergic cough 05/14/2016   Neck pain on left side 01/01/2016   Left ventricular outflow tract obstruction 62/26/3335   Metabolic syndrome X 45/62/5638   Osteopenia 12/12/2013   Anemia 11/29/2013   Morbid  obesity ([place]) 10/17/2012   Aneurysm of thoracic aorta ([place]) 10/12/2012   Allergic rhinitis 03/02/2012   Disorder of bladder 09/24/2010   Malignant neoplasm of female breast ([place]) 02/04/2008   Hyperlipidemia 02/04/2008   Essential hypertension 02/04/2008    PCP: [name], MD  REFERRING PROVIDER: PT eval/tx for M54.2 cervicalgia per [name], DO needs dry needling  REFERRING DIAG: PT eval/tx for M54.2 cervicalgia per [name], DO needs dry needling   THERAPY DIAG:  Chronic left shoulder pain  Other symptoms and signs involving the musculoskeletal system  Neck pain  Rationale for Evaluation and Treatment Rehabilitation  ONSET DATE: several months  SUBJECTIVE:                                                                                                                                                                                                         SUBJECTIVE STATEMENT: Neck is feeling ok today; trying to be compliant with HEP.   PERTINENT HISTORY:  Neck surgery 2007; discectomy per Dr. Vertell Limber CPT bilaterally  PAIN:  Are you having pain? Yes: NPRS scale: 3/10 Pain location: left side of neck Pain description: tight and achey Aggravating factors: unknown Relieving factors: Tylenol  PRECAUTIONS: None  WEIGHT BEARING RESTRICTIONS No  FALLS:  Has patient fallen in last 6 months? No  LIVING ENVIRONMENT: Lives with: lives alone Lives in: House/apartment Stairs: Yes: External: 3 steps; on right going up, on left going up, and can reach both Has following equipment at home: Single point cane and Ram - 2 wheeled  OCCUPATION: retired  PLOF: Independent  PATIENT GOALS less pain in my neck  OBJECTIVE:   DIAGNOSTIC FINDINGS:  CLINICAL DATA:  Neck pain with cervical spondylosis and disc disease with worsening pain for 6 months. Left arm pain with limited range of motion.   EXAM: MRI CERVICAL SPINE WITHOUT AND  WITH CONTRAST    TECHNIQUE: Multiplanar and multiecho pulse sequences of the cervical spine, to include the craniocervical junction and cervicothoracic junction, were obtained without and with intravenous contrast.   CONTRAST:  44m GADAVIST GADOBUTROL 1 MMOL/ML IV SOLN   COMPARISON:  Cervical spine MRI 10/02/2014   FINDINGS: Alignment: There is slight reversal of the normal cervical curvature centered at C4-C5, similar to the prior study. There is no antero or retrolisthesis.   Vertebrae: Vertebral body heights are preserved. Background marrow signal is normal. There is mild degenerative endplate marrow signal abnormality at C3-C4. There is no suspicious marrow signal abnormality, marrow edema or abnormal marrow enhancement.   Cord: Normal in signal and morphology.   Posterior Fossa, vertebral arteries, paraspinal tissues: The imaged posterior fossa is unremarkable. The vertebral artery flow voids are normal. The paraspinal soft tissues are unremarkable.   Disc levels:   There is disc desiccation and narrowing at C3-C4 through C6-C7, overall slightly progressed in the interim.   C2-C3: No significant spinal canal or neural foraminal stenosis.   C3-C4: There is a shallow posterior disc osteophyte complex with bilateral uncovertebral ridging and facet arthropathy resulting in moderate right worse than left neural foraminal stenosis without significant spinal canal stenosis, progressed since 2016.   C4-C5: There is a shallow posterior disc osteophyte complex with bilateral uncovertebral ridging and mild facet arthropathy resulting in mild spinal canal stenosis with slight flattening of the ventral cord surface and moderate right worse than left neural foraminal stenosis slightly progressed since 2016   C5-C6: There is bilateral uncovertebral ridging with a shallow posterior disc osteophyte complex and mild facet arthropathy resulting in moderate right and mild left neural foraminal  stenosis and no significant spinal canal stenosis not significantly changed.   C6-C7: There is mild bilateral uncovertebral ridging resulting in mild bilateral neural foraminal stenosis without significant spinal canal stenosis   C7-T1: No significant spinal canal or neural foraminal stenosis   T1-T2: There is a small right paracentral protrusion without significant spinal canal or neural foraminal stenosis.   IMPRESSION: 1. Multilevel disc degeneration most advanced at C3-C4 through C6-C7 as above have overall slightly progressed since 2016. 2. Up to moderate right worse than left neural foraminal stenosis at C3-C4, and moderate right foraminal stenosis at C5-C6. 3. No greater than mild spinal canal stenosis at C4-C5 with slight flattening of the ventral cord surface.    PATIENT SURVEYS:  FOTO 50   COGNITION: Overall cognitive status: Within functional limits for tasks assessed   SENSATION: Sometimes N/T in left leg?  POSTURE: rounded shoulders and forward head  PALPATION: Tender and tight left upper trap and levatore   Limited left shoulder mobility  CERVICAL ROM:   Active ROM A/PROM (deg) eval  Flexion 12  Extension 20  Right lateral flexion 32  Left lateral flexion 12*  Right rotation 50  Left rotation 34*   (Blank rows = not tested)  UPPER EXTREMITY ROM:  Active ROM Right eval Left eval  Shoulder flexion 150 114  Shoulder extension    Shoulder abduction    Shoulder adduction    Shoulder extension    Shoulder internal rotation    Shoulder external rotation    Elbow flexion    Elbow extension    Wrist flexion    Wrist extension    Wrist ulnar deviation    Wrist radial deviation    Wrist pronation    Wrist supination     (Blank rows = not tested)  UPPER EXTREMITY MMT:  MMT Right eval Left eval  Shoulder flexion 4+ 4-  Shoulder extension    Shoulder abduction 4+ 4  Shoulder adduction    Shoulder extension    Shoulder internal  rotation    Shoulder external rotation    Middle trapezius 5 4  Lower trapezius    Elbow flexion    Elbow extension    Wrist flexion    Wrist extension    Wrist ulnar deviation    Wrist radial deviation    Wrist pronation    Wrist supination    Grip strength 62 52   (Blank rows = not tested)     TODAY'S TREATMENT:   05/06/22  Review of HEP and goals   Sitting:  Cervical retractions 2 x 10  Scapular retractions 2 x 10  Upper trap stretch 3 x 20"   STM to cervical spine and left upper trap to decrease pain and improve soft tissue mobility; no other treatment performed during manual treatment   Supine:  Cervical retractions 2 x 10  Scapular retractions 2 x 10  AAROM shoulder flexion with dowel 2 x 10           Physical therapy evaluation and HEP instruction   PATIENT EDUCATION:  Education details: Patient educated on exam findings, POC, scope of PT, HEP. Person educated: Patient Education method: Explanation, Demonstration, and Handouts Education comprehension: verbalized understanding, returned demonstration, verbal cues required, and tactile cues required    HOME EXERCISE PROGRAM: Access Code: [email or web address] URL: [email or web address] Date: 05/06/2022 Prepared by: [facility] - Rehab  Exercises - Supine Chin Tuck  - 2 x daily - 7 x weekly - 1 sets - 10 reps - Supine Scapular Retraction  - 2 x daily - 7 x weekly - 1 sets - 10 reps - Seated Upper Trapezius Stretch  - 2 x daily - 7 x weekly - 1 sets - 3 reps - 20" hold - Seated Cervical Retraction  - 1 x daily - 7 x weekly - 3 sets - 10 reps - Seated Scapular Retraction  - 1 x daily - 7 x weekly - 2 sets - 10 reps - Supine Shoulder Flexion with Dowel  - 1 x daily - 7 x weekly - 2 sets - 10 reps ASSESSMENT:  CLINICAL IMPRESSION: Today's session began with review of HEP and goals. Patient verbalizes agreement with set rehab goals. Progressed cervical strengthening exercise today. Added shoulder flexion with  dowel to increase left shoulder flexion; STM to decrease left upper trap and paraspinal tighness; noted soft tissue knot left paraspinal area; no pain with palpation; patient states has been present prior to MRI.   Patient will benefit from continued skilled therapy services  to address deficits and promote return to optimal function.      OBJECTIVE IMPAIRMENTS decreased activity tolerance, decreased knowledge of condition, decreased mobility, decreased ROM, decreased strength, increased fascial restrictions, impaired perceived functional ability, increased muscle spasms, impaired flexibility, impaired UE functional use, and pain.   ACTIVITY LIMITATIONS carrying, lifting, sitting, standing, sleeping, reach over head, hygiene/grooming, and caring for others  PARTICIPATION LIMITATIONS: meal prep, cleaning, laundry, driving, shopping, and community activity  PERSONAL FACTORS Past/current experiences are also affecting patient's functional outcome.   REHAB POTENTIAL: Good  CLINICAL DECISION MAKING: Stable/uncomplicated  EVALUATION COMPLEXITY: Low   GOALS: Goals reviewed with patient? No  SHORT TERM GOALS: Target date: 05/21/2022    patient will be independent with initial HEP Baseline:  Goal status: IN PROGRESS  2.  Patient will improve left cervical rotation and side bending by 10 degrees to improve ability to scan environment for safety with driving.  Baseline: see above Goal status: IN PROGRESS   LONG TERM GOALS: Target date: 06/11/2022   Patient will be independent with advanced HEP and self management strategies to improve quality of life and functional outcomes. Baseline:  Goal status: IN PROGRESS  2.  Patient will improve FOTO score to predicted value to demonstrate improved functional mobility  Baseline: 50 Goal status: IN PROGRESS  3.  Patient will report at least 50% improvement in overall symptoms and/or function to demonstrate improved functional  mobility  Baseline:  Goal status: IN PROGRESS  4.  Patient will improve left shoulder flexion in sitting to 130 degrees to improve ability to reach overhead in kitchen cabinets.  Baseline: 115 Goal status: IN PROGRESS  5.  Patient will improve cervical mobility by 40 degrees total to improve ability to scan environment for safety when driving. Baseline: see above Goal status: IN PROGRESS     PLAN: PT FREQUENCY: 1x/week  PT DURATION: 6 weeks  PLANNED INTERVENTIONS: Therapeutic exercises, Therapeutic activity, Neuromuscular re-education, Balance training, Gait training, Patient/Family education, Joint manipulation, Joint mobilization, Stair training, Orthotic/Fit training, DME instructions, Aquatic Therapy, Dry Needling, Electrical stimulation, Spinal manipulation, Spinal mobilization, Cryotherapy, Moist heat, Compression bandaging, scar mobilization, Splintting, Taping, Traction, Ultrasound, Ionotophoresis '4mg'$ /ml Dexamethasone, and Manual therapy  PLAN FOR NEXT SESSION: progress cervical and shoulder mobility as able.  Postural strengthening; STM as appropriate  10:34 AM, 05/06/22 Kiptyn Rafuse Small Casson Catena MPT [facility] [phone]

## 2022-05-08 DIAGNOSIS — H524 Presbyopia: Secondary | ICD-10-CM | POA: Diagnosis not present

## 2022-05-08 DIAGNOSIS — H40033 Anatomical narrow angle, bilateral: Secondary | ICD-10-CM | POA: Diagnosis not present

## 2022-05-08 DIAGNOSIS — H25813 Combined forms of age-related cataract, bilateral: Secondary | ICD-10-CM | POA: Diagnosis not present

## 2022-05-08 DIAGNOSIS — H04123 Dry eye syndrome of bilateral lacrimal glands: Secondary | ICD-10-CM | POA: Diagnosis not present

## 2022-05-08 DIAGNOSIS — H35032 Hypertensive retinopathy, left eye: Secondary | ICD-10-CM | POA: Diagnosis not present

## 2022-05-09 ENCOUNTER — Other Ambulatory Visit: Payer: Self-pay | Admitting: Family Medicine

## 2022-05-09 ENCOUNTER — Ambulatory Visit (HOSPITAL_COMMUNITY): Payer: PPO | Admitting: Physical Therapy

## 2022-05-09 DIAGNOSIS — R29898 Other symptoms and signs involving the musculoskeletal system: Secondary | ICD-10-CM

## 2022-05-09 DIAGNOSIS — G8929 Other chronic pain: Secondary | ICD-10-CM

## 2022-05-09 DIAGNOSIS — M542 Cervicalgia: Secondary | ICD-10-CM | POA: Diagnosis not present

## 2022-05-09 NOTE — Therapy (Signed)
OUTPATIENT PHYSICAL THERAPY CERVICAL TREATMENT   Patient Name: Desiree [name] MRN: [id number] DOB:03-Dec-1945, 76 y.o., female Today's Date: 05/09/2022   PT End of Session - 05/09/22 1203     Visit Number 3    Number of Visits 6    Date for PT Re-Evaluation 06/11/22    Authorization Type [employer]/ Medicaid of [place]; no VL; no auth    Progress Note Due on Visit 10    PT Start Time 1120    PT Stop Time 1200    PT Time Calculation (min) 40 min    Activity Tolerance Patient tolerated treatment well    Behavior During Therapy WFL for tasks assessed/performed               Past Medical History:  Diagnosis Date   Allergic rhinitis    Arthritis HIPS/ WRISTS   Cancer ([place])    DDD (degenerative disc disease), cervical    GERD (gastroesophageal reflux disease) OCCASIONAL   Heart murmur    turbulent flow through LVOT, no obstruction 08/26/18 echo   History of Bell's palsy 2011  RIGHT SIDE -- RESOLVED   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--  S/P  RIGHT MASTECTMOY AND RADIATION--  NO RECURRANCE   History of CVA (cerebrovascular accident) PER SCAN IN 2011   Hyperlipidemia    Hypertension    Impaired fasting glucose PER PCP  DR Moshe Cipro   WATCH DIET   Mixed urge and stress incontinence    Nocturia    Sinus drainage    Thoracic ascending aortic aneurysm ([place]) LAST CHEST CT 09-02-2010--  FOLLOWED BY  CARDIOLOGIST--  DR [id number]   Vaginal wall prolapse    Past Surgical History:  Procedure Laterality Date   ABDOMINAL HYSTERECTOMY     BILATERAL CARPAL TUNNEL RELEASE  1980'S   CARDIAC CATHETERIZATION  02-01-2009  DR Roscoe   NORMAL CORONARY ARTERIES/ NORMAL LV SIZE AND FUNCTION/ AORTIC ROOT SIZE AT THE UPPER LIMIT OF NORMAL    CARPAL TUNNEL RELEASE     CERVICAL [place] SURGERY  12-12-2005  DR Vertell Limber   LEFT  C6 - C7 HERINATED / DDD/ SPONDYLOSIS   COLONOSCOPY N/A 02/10/2014   Surgeon: Danie Binder, MD; Normal TI, mild diverticulosis in sigmoid and transverse  colon, small external hemorrhoids, no obvious source for anemia.  Repeat colonoscopy in 10-15 years if benefits outweigh risk.   CYSTOCELE REPAIR  08/02/2012   Procedure: ANTERIOR REPAIR (CYSTOCELE);  Surgeon: Ailene Rud, MD;  Location: [facility];  Service: Urology;  Laterality: N/A;  [employer] Uphold Anterior Pelvic Floor Sacrospinus Repair. Anterior wall of the vagina.   ESOPHAGOGASTRODUODENOSCOPY N/A 02/10/2014   Surgeon: Danie Binder, MD;   Mild gastritis/duodenitis s/p biopsied, no obvious source for anemia.  Pathology with mild chronic gastritis with intestinal metaplasia, negative for H. pylori, duodenal biopsy benign.   GIVENS CAPSULE STUDY N/A 03/13/2014   Surgeon: Danie Binder, MD;  Normal exam.  Suspected anemia most likely due to chronic disease.   JOINT REPLACEMENT N/A    [employer] 06/16/2020   NECK SURGERY  2007   RIGHT PARTIAL MASTECTOMY  11-11-2004  DR PETER YOUNG   DUCTAL CARCINOMA IN SITU RIGHT BREAST   TOTAL ABDOMINAL HYSTERECTOMY W/ BILATERAL SALPINGOOPHORECTOMY  1990   TOTAL KNEE ARTHROPLASTY Right 12/21/2019   Procedure: RIGHT  TOTAL KNEE ARTHROPLASTY;  Surgeon: [name], MD;  Location: [place];  Service: Orthopedics;  Laterality: Right;   TRANSTHORACIC ECHOCARDIOGRAM  03-30-2008  DR MARGARET SIMPSON   LV SIZE AND FUNCTION NORMAL/ MODERATE AORTIC ARCH DILATATION/ MILD MR   Patient Active Problem List   Diagnosis Date Noted   Stage 3b chronic kidney disease (CKD) ([place]) 03/20/2022   AKI (acute kidney injury) ([place]) 03/19/2022   Iron deficiency anemia 01/30/2022   Infected tooth 09/11/2021   Vertigo 07/10/2021   Unilateral primary osteoarthritis, left knee 09/13/2020   S/P TKR (total knee replacement), right 12/21/2019   Fatigue 10/18/2019   Left shoulder pain 04/11/2019   Ganglion cyst of volar aspect of left wrist 10/05/2018   Allergic cough 05/14/2016   Neck pain on left side 01/01/2016   Left ventricular outflow tract  obstruction 95/28/4132   Metabolic syndrome X 44/08/270   Osteopenia 12/12/2013   Anemia 11/29/2013   Morbid obesity ([place]) 10/17/2012   Aneurysm of thoracic aorta ([employer]) 10/12/2012   Allergic rhinitis 03/02/2012   Disorder of bladder 09/24/2010   Malignant neoplasm of female breast ([place]) 02/04/2008   Hyperlipidemia 02/04/2008   Essential hypertension 02/04/2008    PCP: [name], MD  REFERRING PROVIDER: PT eval/tx for M54.2 cervicalgia per [name], DO needs dry needling  REFERRING DIAG: PT eval/tx for M54.2 cervicalgia per [name], DO needs dry needling   THERAPY DIAG:  Chronic left shoulder pain  Other symptoms and signs involving the musculoskeletal system  Neck pain  Rationale for Evaluation and Treatment Rehabilitation  ONSET DATE: several months  SUBJECTIVE:                                                                                                                                                                                                         SUBJECTIVE STATEMENT: PT states that she is not really having any pain in her neck it is more stiff.     PERTINENT HISTORY:  Neck surgery 2007; discectomy per Dr. Vertell Limber CPT bilaterally  PAIN:  Are you having pain? Yes: NPRS scale: 0/10 Pain location: left side of neck Pain description: tight and achey Aggravating factors: unknown Relieving factors: Tylenol OCCUPATION: retired  PLOF: Independent  PATIENT GOALS less pain in my neck  OBJECTIVE:   DIAGNOSTIC FINDINGS:  CLINICAL DATA:  Neck pain with cervical spondylosis and disc disease with worsening pain for 6 months. Left arm pain with limited range of motion.   EXAM: MRI CERVICAL SPINE WITHOUT AND WITH CONTRAST   TECHNIQUE: Multiplanar and multiecho pulse sequences of the cervical spine, to include the craniocervical junction and cervicothoracic junction, were obtained without and with intravenous contrast.   CONTRAST:  23m  GADAVIST GADOBUTROL 1 MMOL/ML IV SOLN   COMPARISON:  Cervical spine MRI 10/02/2014   FINDINGS: Alignment: There is slight reversal of the normal cervical curvature centered at C4-C5, similar to the prior study. There is no antero or retrolisthesis.   Vertebrae: Vertebral body heights are preserved. Background marrow signal is normal. There is mild degenerative endplate marrow signal abnormality at C3-C4. There is no suspicious marrow signal abnormality, marrow edema or abnormal marrow enhancement.   Cord: Normal in signal and morphology.   Posterior Fossa, vertebral arteries, paraspinal tissues: The imaged posterior fossa is unremarkable. The vertebral artery flow voids are normal. The paraspinal soft tissues are unremarkable.   Disc levels:   There is disc desiccation and narrowing at C3-C4 through C6-C7, overall slightly progressed in the interim.   C2-C3: No significant spinal canal or neural foraminal stenosis.   C3-C4: There is a shallow posterior disc osteophyte complex with bilateral uncovertebral ridging and facet arthropathy resulting in moderate right worse than left neural foraminal stenosis without significant spinal canal stenosis, progressed since 2016.   C4-C5: There is a shallow posterior disc osteophyte complex with bilateral uncovertebral ridging and mild facet arthropathy resulting in mild spinal canal stenosis with slight flattening of the ventral cord surface and moderate right worse than left neural foraminal stenosis slightly progressed since 2016   C5-C6: There is bilateral uncovertebral ridging with a shallow posterior disc osteophyte complex and mild facet arthropathy resulting in moderate right and mild left neural foraminal stenosis and no significant spinal canal stenosis not significantly changed.   C6-C7: There is mild bilateral uncovertebral ridging resulting in mild bilateral neural foraminal stenosis without significant spinal canal  stenosis   C7-T1: No significant spinal canal or neural foraminal stenosis   T1-T2: There is a small right paracentral protrusion without significant spinal canal or neural foraminal stenosis.   IMPRESSION: 1. Multilevel disc degeneration most advanced at C3-C4 through C6-C7 as above have overall slightly progressed since 2016. 2. Up to moderate right worse than left neural foraminal stenosis at C3-C4, and moderate right foraminal stenosis at C5-C6. 3. No greater than mild spinal canal stenosis at C4-C5 with slight flattening of the ventral cord surface.    PATIENT SURVEYS:  FOTO 50  PALPATION: Tender and tight left upper trap and levatore   Limited left shoulder mobility  CERVICAL ROM:   Active ROM A/PROM (deg) eval  Flexion 12  Extension 20  Right lateral flexion 32  Left lateral flexion 12*  Right rotation 50  Left rotation 34*   (Blank rows = not tested)  UPPER EXTREMITY ROM:  Active ROM Right eval Left eval  Shoulder flexion 150 114  Shoulder extension    Shoulder abduction    Shoulder adduction    Shoulder extension    Shoulder internal rotation    Shoulder external rotation    Elbow flexion    Elbow extension    Wrist flexion    Wrist extension    Wrist ulnar deviation    Wrist radial deviation    Wrist pronation    Wrist supination     (Blank rows = not tested)  UPPER EXTREMITY MMT:  MMT Right eval Left eval  Shoulder flexion 4+ 4-  Shoulder extension    Shoulder abduction 4+ 4  Shoulder adduction    Shoulder extension    Shoulder internal rotation    Shoulder external rotation    Middle trapezius 5 4  Lower trapezius    Elbow flexion  Elbow extension    Wrist flexion    Wrist extension    Wrist ulnar deviation    Wrist radial deviation    Wrist pronation    Wrist supination    Grip strength 62 52   (Blank rows = not tested)     TODAY'S TREATMENT:  05/09/22 Sitting: UBE backward level 1 x 4:00 Pulley for flexion x  10 B ER with 2# x 10 with scapular retraction            Cervical excursions x 3            Thoracic excursions x 3            W-back x 10 with 2#             Backward shoulder rolls x 10              STM to cervical spine and bilateral upper trap to decrease pain and improve soft tissue mobility; no other treatment performed during manual treatment   05/06/22  Review of HEP and goals   Sitting:  Cervical retractions 2 x 10  Scapular retractions 2 x 10  Upper trap stretch 3 x 20"   STM to cervical spine and left upper trap to decrease pain and improve soft tissue mobility; no other treatment performed during manual treatment   Supine:  Cervical retractions 2 x 10  Scapular retractions 2 x 10  AAROM shoulder flexion with dowel 2 x 10           Physical therapy evaluation and HEP instruction   PATIENT EDUCATION:  Education details: Patient educated on exam findings, POC, scope of PT, HEP. Person educated: Patient Education method: Explanation, Demonstration, and Handouts Education comprehension: verbalized understanding, returned demonstration, verbal cues required, and tactile cues required    HOME EXERCISE PROGRAM: Access Code: [email or web address] URL: [email or web address] Date: 05/06/2022 Prepared by: [facility] - Rehab  Exercises - Supine Chin Tuck  - 2 x daily - 7 x weekly - 1 sets - 10 reps - Supine Scapular Retraction  - 2 x daily - 7 x weekly - 1 sets - 10 reps - Seated Upper Trapezius Stretch  - 2 x daily - 7 x weekly - 1 sets - 3 reps - 20" hold - Seated Cervical Retraction  - 1 x daily - 7 x weekly - 3 sets - 10 reps - Seated Scapular Retraction  - 1 x daily - 7 x weekly - 2 sets - 10 reps - Supine Shoulder Flexion with Dowel  - 1 x daily - 7 x weekly - 2 sets - 10 reps ASSESSMENT:  CLINICAL IMPRESSION: Pt is improving in cervical ROM.   Therapist added ROM and scapular stability exercises.  Noted significant tightness in B trapezius area which was relieved  slightly with manual.  Patient will continue to benefit from continued skilled therapy services  to address deficits and promote return to optimal function.      OBJECTIVE IMPAIRMENTS decreased activity tolerance, decreased knowledge of condition, decreased mobility, decreased ROM, decreased strength, increased fascial restrictions, impaired perceived functional ability, increased muscle spasms, impaired flexibility, impaired UE functional use, and pain.   ACTIVITY LIMITATIONS carrying, lifting, sitting, standing, sleeping, reach over head, hygiene/grooming, and caring for others  PARTICIPATION LIMITATIONS: meal prep, cleaning, laundry, driving, shopping, and community activity  PERSONAL FACTORS Past/current experiences are also affecting patient's functional outcome.   REHAB POTENTIAL: Good  CLINICAL DECISION MAKING: Stable/uncomplicated  EVALUATION COMPLEXITY: Low  GOALS: Goals reviewed with patient? No  SHORT TERM GOALS: Target date: 05/21/2022    patient will be independent with initial HEP Baseline:  Goal status: IN PROGRESS  2.  Patient will improve left cervical rotation and side bending by 10 degrees to improve ability to scan environment for safety with driving.  Baseline: see above Goal status: IN PROGRESS   LONG TERM GOALS: Target date: 06/11/2022   Patient will be independent with advanced HEP and self management strategies to improve quality of life and functional outcomes. Baseline:  Goal status: IN PROGRESS  2.  Patient will improve FOTO score to predicted value to demonstrate improved functional mobility  Baseline: 50 Goal status: IN PROGRESS  3.  Patient will report at least 50% improvement in overall symptoms and/or function to demonstrate improved functional mobility  Baseline:  Goal status: IN PROGRESS  4.  Patient will improve left shoulder flexion in sitting to 130 degrees to improve ability to reach overhead in kitchen cabinets.  Baseline:  115 Goal status: IN PROGRESS  5.  Patient will improve cervical mobility by 40 degrees total to improve ability to scan environment for safety when driving. Baseline: see above Goal status: IN PROGRESS     PLAN: PT FREQUENCY: 1x/week  PT DURATION: 6 weeks  PLANNED INTERVENTIONS: Therapeutic exercises, Therapeutic activity, Neuromuscular re-education, Balance training, Gait training, Patient/Family education, Joint manipulation, Joint mobilization, Stair training, Orthotic/Fit training, DME instructions, Aquatic Therapy, Dry Needling, Electrical stimulation, Spinal manipulation, Spinal mobilization, Cryotherapy, Moist heat, Compression bandaging, scar mobilization, Splintting, Taping, Traction, Ultrasound, Ionotophoresis '4mg'$ /ml Dexamethasone, and Manual therapy  PLAN FOR NEXT SESSION: progress cervical and shoulder mobility as able.  Postural strengthening; STM as appropriate Rayetta Humphrey, PT CLT [phone]  12:05 PM

## 2022-05-13 ENCOUNTER — Ambulatory Visit (HOSPITAL_COMMUNITY): Payer: PPO

## 2022-05-13 DIAGNOSIS — G8929 Other chronic pain: Secondary | ICD-10-CM

## 2022-05-13 DIAGNOSIS — M542 Cervicalgia: Secondary | ICD-10-CM | POA: Diagnosis not present

## 2022-05-13 DIAGNOSIS — R29898 Other symptoms and signs involving the musculoskeletal system: Secondary | ICD-10-CM

## 2022-05-13 NOTE — Therapy (Signed)
OUTPATIENT PHYSICAL THERAPY CERVICAL TREATMENT   Patient Name: Desiree [name] MRN: [id number] DOB:11/06/1945, 76 y.o., female Today's Date: 05/13/2022   PT End of Session - 05/13/22 0859     Visit Number 4    Number of Visits 6    Date for PT Re-Evaluation 06/11/22    Authorization Type [employer]/ Medicaid of [place]; no VL; no auth    Progress Note Due on Visit 10    PT Start Time 0900    PT Stop Time 0941    PT Time Calculation (min) 41 min    Activity Tolerance Patient tolerated treatment well    Behavior During Therapy WFL for tasks assessed/performed                Past Medical History:  Diagnosis Date   Allergic rhinitis    Arthritis HIPS/ WRISTS   Cancer ([place])    DDD (degenerative disc disease), cervical    GERD (gastroesophageal reflux disease) OCCASIONAL   Heart murmur    turbulent flow through LVOT, no obstruction 08/26/18 echo   History of Bell's palsy 2011  RIGHT SIDE -- RESOLVED   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--  S/P  RIGHT MASTECTMOY AND RADIATION--  NO RECURRANCE   History of CVA (cerebrovascular accident) PER SCAN IN 2011   Hyperlipidemia    Hypertension    Impaired fasting glucose PER PCP  DR Moshe Cipro   WATCH DIET   Mixed urge and stress incontinence    Nocturia    Sinus drainage    Thoracic ascending aortic aneurysm ([place]) LAST CHEST CT 09-02-2010--  FOLLOWED BY  CARDIOLOGIST--  DR [id number]   Vaginal wall prolapse    Past Surgical History:  Procedure Laterality Date   ABDOMINAL HYSTERECTOMY     BILATERAL CARPAL TUNNEL RELEASE  1980'S   CARDIAC CATHETERIZATION  02-01-2009  DR [place]   NORMAL CORONARY ARTERIES/ NORMAL LV SIZE AND FUNCTION/ AORTIC ROOT SIZE AT THE UPPER LIMIT OF NORMAL    CARPAL TUNNEL RELEASE     CERVICAL [place] SURGERY  12-12-2005  DR Vertell Limber   LEFT  C6 - C7 HERINATED / DDD/ SPONDYLOSIS   COLONOSCOPY N/A 02/10/2014   Surgeon: Danie Binder, MD; Normal TI, mild diverticulosis in sigmoid and  transverse colon, small external hemorrhoids, no obvious source for anemia.  Repeat colonoscopy in 10-15 years if benefits outweigh risk.   CYSTOCELE REPAIR  08/02/2012   Procedure: ANTERIOR REPAIR (CYSTOCELE);  Surgeon: Ailene Rud, MD;  Location: [facility];  Service: Urology;  Laterality: N/A;  [employer] Uphold Anterior Pelvic Floor Sacrospinus Repair. Anterior wall of the vagina.   ESOPHAGOGASTRODUODENOSCOPY N/A 02/10/2014   Surgeon: Danie Binder, MD;   Mild gastritis/duodenitis s/p biopsied, no obvious source for anemia.  Pathology with mild chronic gastritis with intestinal metaplasia, negative for H. pylori, duodenal biopsy benign.   GIVENS CAPSULE STUDY N/A 03/13/2014   Surgeon: Danie Binder, MD;  Normal exam.  Suspected anemia most likely due to chronic disease.   JOINT REPLACEMENT N/A    Phreesia 06/16/2020   NECK SURGERY  2007   RIGHT PARTIAL MASTECTOMY  11-11-2004  DR PETER YOUNG   DUCTAL CARCINOMA IN SITU RIGHT BREAST   TOTAL ABDOMINAL HYSTERECTOMY W/ BILATERAL SALPINGOOPHORECTOMY  1990   TOTAL KNEE ARTHROPLASTY Right 12/21/2019   Procedure: RIGHT  TOTAL KNEE ARTHROPLASTY;  Surgeon: [name], MD;  Location: [facility];  Service: Orthopedics;  Laterality: Right;   TRANSTHORACIC ECHOCARDIOGRAM  03-30-2008  DR MARGARET SIMPSON   LV SIZE AND FUNCTION NORMAL/ MODERATE AORTIC ARCH DILATATION/ MILD MR   Patient Active Problem List   Diagnosis Date Noted   Stage 3b chronic kidney disease (CKD) ([place]) 03/20/2022   AKI (acute kidney injury) ([place]) 03/19/2022   Iron deficiency anemia 01/30/2022   Infected tooth 09/11/2021   Vertigo 07/10/2021   Unilateral primary osteoarthritis, left knee 09/13/2020   S/P TKR (total knee replacement), right 12/21/2019   Fatigue 10/18/2019   Left shoulder pain 04/11/2019   Ganglion cyst of volar aspect of left wrist 10/05/2018   Allergic cough 05/14/2016   Neck pain on left side 01/01/2016   Left ventricular outflow  tract obstruction 65/68/1275   Metabolic syndrome X 17/00/1749   Osteopenia 12/12/2013   Anemia 11/29/2013   Morbid obesity ([place]) 10/17/2012   Aneurysm of thoracic aorta ([place]) 10/12/2012   Allergic rhinitis 03/02/2012   Disorder of bladder 09/24/2010   Malignant neoplasm of female breast ([place]) 02/04/2008   Hyperlipidemia 02/04/2008   Essential hypertension 02/04/2008    PCP: [name], MD  REFERRING PROVIDER: PT eval/tx for M54.2 cervicalgia per [name], DO needs dry needling  REFERRING DIAG: PT eval/tx for M54.2 cervicalgia per [name], DO needs dry needling   THERAPY DIAG:  Chronic left shoulder pain  Other symptoms and signs involving the musculoskeletal system  Neck pain  Rationale for Evaluation and Treatment Rehabilitation  ONSET DATE: several months  SUBJECTIVE:                                                                                                                                                                                                         SUBJECTIVE STATEMENT: Neck and shoulder are hurting more today possibly due to cooler weather?     PERTINENT HISTORY:  Neck surgery 2007; discectomy per Dr. Vertell Limber CPT bilaterally  PAIN:  Are you having pain? Yes: NPRS scale: 8/10 Pain location: left side of neck Pain description: tight and achey Aggravating factors: unknown Relieving factors: Tylenol OCCUPATION: retired  PLOF: Independent  PATIENT GOALS less pain in my neck  OBJECTIVE:   DIAGNOSTIC FINDINGS:  CLINICAL DATA:  Neck pain with cervical spondylosis and disc disease with worsening pain for 6 months. Left arm pain with limited range of motion.   EXAM: MRI CERVICAL SPINE WITHOUT AND WITH CONTRAST   TECHNIQUE: Multiplanar and multiecho pulse sequences of the cervical spine, to include the craniocervical junction and cervicothoracic junction, were obtained without and with intravenous contrast.   CONTRAST:  109m  GADAVIST GADOBUTROL  1 MMOL/ML IV SOLN   COMPARISON:  Cervical spine MRI 10/02/2014   FINDINGS: Alignment: There is slight reversal of the normal cervical curvature centered at C4-C5, similar to the prior study. There is no antero or retrolisthesis.   Vertebrae: Vertebral body heights are preserved. Background marrow signal is normal. There is mild degenerative endplate marrow signal abnormality at C3-C4. There is no suspicious marrow signal abnormality, marrow edema or abnormal marrow enhancement.   Cord: Normal in signal and morphology.   Posterior Fossa, vertebral arteries, paraspinal tissues: The imaged posterior fossa is unremarkable. The vertebral artery flow voids are normal. The paraspinal soft tissues are unremarkable.   Disc levels:   There is disc desiccation and narrowing at C3-C4 through C6-C7, overall slightly progressed in the interim.   C2-C3: No significant spinal canal or neural foraminal stenosis.   C3-C4: There is a shallow posterior disc osteophyte complex with bilateral uncovertebral ridging and facet arthropathy resulting in moderate right worse than left neural foraminal stenosis without significant spinal canal stenosis, progressed since 2016.   C4-C5: There is a shallow posterior disc osteophyte complex with bilateral uncovertebral ridging and mild facet arthropathy resulting in mild spinal canal stenosis with slight flattening of the ventral cord surface and moderate right worse than left neural foraminal stenosis slightly progressed since 2016   C5-C6: There is bilateral uncovertebral ridging with a shallow posterior disc osteophyte complex and mild facet arthropathy resulting in moderate right and mild left neural foraminal stenosis and no significant spinal canal stenosis not significantly changed.   C6-C7: There is mild bilateral uncovertebral ridging resulting in mild bilateral neural foraminal stenosis without significant spinal canal  stenosis   C7-T1: No significant spinal canal or neural foraminal stenosis   T1-T2: There is a small right paracentral protrusion without significant spinal canal or neural foraminal stenosis.   IMPRESSION: 1. Multilevel disc degeneration most advanced at C3-C4 through C6-C7 as above have overall slightly progressed since 2016. 2. Up to moderate right worse than left neural foraminal stenosis at C3-C4, and moderate right foraminal stenosis at C5-C6. 3. No greater than mild spinal canal stenosis at C4-C5 with slight flattening of the ventral cord surface.    PATIENT SURVEYS:  FOTO 50  PALPATION: Tender and tight left upper trap and levatore   Limited left shoulder mobility  CERVICAL ROM:   Active ROM A/PROM (deg) eval  Flexion 12  Extension 20  Right lateral flexion 32  Left lateral flexion 12*  Right rotation 50  Left rotation 34*   (Blank rows = not tested)  UPPER EXTREMITY ROM:  Active ROM Right eval Left eval  Shoulder flexion 150 114  Shoulder extension    Shoulder abduction    Shoulder adduction    Shoulder extension    Shoulder internal rotation    Shoulder external rotation    Elbow flexion    Elbow extension    Wrist flexion    Wrist extension    Wrist ulnar deviation    Wrist radial deviation    Wrist pronation    Wrist supination     (Blank rows = not tested)  UPPER EXTREMITY MMT:  MMT Right eval Left eval  Shoulder flexion 4+ 4-  Shoulder extension    Shoulder abduction 4+ 4  Shoulder adduction    Shoulder extension    Shoulder internal rotation    Shoulder external rotation    Middle trapezius 5 4  Lower trapezius    Elbow flexion    Elbow extension  Wrist flexion    Wrist extension    Wrist ulnar deviation    Wrist radial deviation    Wrist pronation    Wrist supination    Grip strength 62 52   (Blank rows = not tested)     TODAY'S TREATMENT:  05/13/22 UBE backwards x 4' level 1 Pulley for flexion x  2'  Sitting: Cervical retractions 2 x 10 Scapular retractions 2 x 10 Thoracic extensions over chair x 10 AAROM left shoulder flexion with wand x 10 Cervical spine rotations x 10 Upper trap stretch 5 x 20"  Standing: RTB shoulder retractions 2 x 10 RTB shoulder extensions 2 x 10   STM to cervical spine and bilateral upper trap to decrease pain and improve soft tissue mobility; no other treatment performed during manual treatment     05/09/22 Sitting: UBE backward level 1 x 4:00 Pulley for flexion x 10 B ER with 2# x 10 with scapular retraction            Cervical excursions x 3            Thoracic excursions x 3            W-back x 10 with 2#             Backward shoulder rolls x 10              STM to cervical spine and bilateral upper trap to decrease pain and improve soft tissue mobility; no other treatment performed during manual treatment   05/06/22  Review of HEP and goals   Sitting:  Cervical retractions 2 x 10  Scapular retractions 2 x 10  Upper trap stretch 3 x 20"   STM to cervical spine and left upper trap to decrease pain and improve soft tissue mobility; no other treatment performed during manual treatment   Supine:  Cervical retractions 2 x 10  Scapular retractions 2 x 10  AAROM shoulder flexion with dowel 2 x 10           Physical therapy evaluation and HEP instruction   PATIENT EDUCATION:  Education details: Patient educated on exam findings, POC, scope of PT, HEP. Person educated: Patient Education method: Explanation, Demonstration, and Handouts Education comprehension: verbalized understanding, returned demonstration, verbal cues required, and tactile cues required    HOME EXERCISE PROGRAM: Access Code: [email or web address] URL: [email or web address] Date: 05/06/2022 Prepared by: [facility] - Rehab  Exercises - Supine Chin Tuck  - 2 x daily - 7 x weekly - 1 sets - 10 reps - Supine Scapular Retraction  - 2 x daily - 7 x weekly - 1 sets -  10 reps - Seated Upper Trapezius Stretch  - 2 x daily - 7 x weekly - 1 sets - 3 reps - 20" hold - Seated Cervical Retraction  - 1 x daily - 7 x weekly - 3 sets - 10 reps - Seated Scapular Retraction  - 1 x daily - 7 x weekly - 2 sets - 10 reps - Supine Shoulder Flexion with Dowel  - 1 x daily - 7 x weekly - 2 sets - 10 reps ASSESSMENT:  CLINICAL IMPRESSION: Today's session continued to work on spinal mobility and postural strengthening. Added thera band retractions and shoulder extensions for postural strengthening today.  Decreased pain to 5/10 at the end of treatment. Patient will continue to benefit from continued skilled therapy services  to address deficits and promote return to optimal function.  OBJECTIVE IMPAIRMENTS decreased activity tolerance, decreased knowledge of condition, decreased mobility, decreased ROM, decreased strength, increased fascial restrictions, impaired perceived functional ability, increased muscle spasms, impaired flexibility, impaired UE functional use, and pain.   ACTIVITY LIMITATIONS carrying, lifting, sitting, standing, sleeping, reach over head, hygiene/grooming, and caring for others  PARTICIPATION LIMITATIONS: meal prep, cleaning, laundry, driving, shopping, and community activity  PERSONAL FACTORS Past/current experiences are also affecting patient's functional outcome.   REHAB POTENTIAL: Good  CLINICAL DECISION MAKING: Stable/uncomplicated  EVALUATION COMPLEXITY: Low   GOALS: Goals reviewed with patient? No  SHORT TERM GOALS: Target date: 05/21/2022    patient will be independent with initial HEP Baseline:  Goal status: IN PROGRESS  2.  Patient will improve left cervical rotation and side bending by 10 degrees to improve ability to scan environment for safety with driving.  Baseline: see above Goal status: IN PROGRESS   LONG TERM GOALS: Target date: 06/11/2022   Patient will be independent with advanced HEP and self management  strategies to improve quality of life and functional outcomes. Baseline:  Goal status: IN PROGRESS  2.  Patient will improve FOTO score to predicted value to demonstrate improved functional mobility  Baseline: 50 Goal status: IN PROGRESS  3.  Patient will report at least 50% improvement in overall symptoms and/or function to demonstrate improved functional mobility  Baseline:  Goal status: IN PROGRESS  4.  Patient will improve left shoulder flexion in sitting to 130 degrees to improve ability to reach overhead in kitchen cabinets.  Baseline: 115 Goal status: IN PROGRESS  5.  Patient will improve cervical mobility by 40 degrees total to improve ability to scan environment for safety when driving. Baseline: see above Goal status: IN PROGRESS     PLAN: PT FREQUENCY: 1x/week  PT DURATION: 6 weeks  PLANNED INTERVENTIONS: Therapeutic exercises, Therapeutic activity, Neuromuscular re-education, Balance training, Gait training, Patient/Family education, Joint manipulation, Joint mobilization, Stair training, Orthotic/Fit training, DME instructions, Aquatic Therapy, Dry Needling, Electrical stimulation, Spinal manipulation, Spinal mobilization, Cryotherapy, Moist heat, Compression bandaging, scar mobilization, Splintting, Taping, Traction, Ultrasound, Ionotophoresis '4mg'$ /ml Dexamethasone, and Manual therapy  PLAN FOR NEXT SESSION: progress cervical and shoulder mobility as able.  Postural strengthening; STM as appropriate  9:40 AM, 05/13/22 Avin Upperman Small Gennie Eisinger MPT [facility] [place] [phone]

## 2022-05-14 ENCOUNTER — Encounter (HOSPITAL_COMMUNITY): Payer: Self-pay | Admitting: Occupational Therapy

## 2022-05-14 ENCOUNTER — Ambulatory Visit (HOSPITAL_COMMUNITY): Payer: PPO | Admitting: Occupational Therapy

## 2022-05-14 DIAGNOSIS — G8929 Other chronic pain: Secondary | ICD-10-CM

## 2022-05-14 DIAGNOSIS — M542 Cervicalgia: Secondary | ICD-10-CM | POA: Diagnosis not present

## 2022-05-14 DIAGNOSIS — R29898 Other symptoms and signs involving the musculoskeletal system: Secondary | ICD-10-CM

## 2022-05-14 NOTE — Therapy (Signed)
OUTPATIENT OCCUPATIONAL THERAPY ORTHO EVALUATION  Patient Name: Desiree [name] MRN: [id number] DOB:05/07/1946, 76 y.o., female Today's Date: 05/14/2022  PCP: Tula Nakayama, MD REFERRING PROVIDER: Tania Ade, MD   OT End of Session - 05/14/22 1331     Visit Number 2    Number of Visits 6    Date for OT Re-Evaluation 06/11/22    Authorization Type 1) [employer]    2) Medicaid of [place]    Authorization Time Period no auth, no visit limit    OT Start Time 1118    OT Stop Time 1158    OT Time Calculation (min) 40 min    Activity Tolerance Patient tolerated treatment well    Behavior During Therapy WFL for tasks assessed/performed              Past Medical History:  Diagnosis Date   Allergic rhinitis    Arthritis HIPS/ WRISTS   Cancer ([place])    DDD (degenerative disc disease), cervical    GERD (gastroesophageal reflux disease) OCCASIONAL   Heart murmur    turbulent flow through LVOT, no obstruction 08/26/18 echo   History of Bell's palsy 2011  RIGHT SIDE -- RESOLVED   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--  S/P  RIGHT MASTECTMOY AND RADIATION--  NO RECURRANCE   History of CVA (cerebrovascular accident) PER SCAN IN 2011   Hyperlipidemia    Hypertension    Impaired fasting glucose PER PCP  DR Moshe Cipro   WATCH DIET   Mixed urge and stress incontinence    Nocturia    Sinus drainage    Thoracic ascending aortic aneurysm ([place]) LAST CHEST CT 09-02-2010--  FOLLOWED BY  CARDIOLOGIST--  DR [id number]   Vaginal wall prolapse    Past Surgical History:  Procedure Laterality Date   ABDOMINAL HYSTERECTOMY     BILATERAL CARPAL TUNNEL RELEASE  1980'S   CARDIAC CATHETERIZATION  02-01-2009  DR [place]   NORMAL CORONARY ARTERIES/ NORMAL LV SIZE AND FUNCTION/ AORTIC ROOT SIZE AT THE UPPER LIMIT OF NORMAL    CARPAL TUNNEL RELEASE     CERVICAL [place] SURGERY  12-12-2005  DR Vertell Limber   LEFT  C6 - C7 HERINATED / DDD/ SPONDYLOSIS   COLONOSCOPY N/A 02/10/2014    Surgeon: Danie Binder, MD; Normal TI, mild diverticulosis in sigmoid and transverse colon, small external hemorrhoids, no obvious source for anemia.  Repeat colonoscopy in 10-15 years if benefits outweigh risk.   CYSTOCELE REPAIR  08/02/2012   Procedure: ANTERIOR REPAIR (CYSTOCELE);  Surgeon: Ailene Rud, MD;  Location: [facility];  Service: Urology;  Laterality: N/A;  [employer] Uphold Anterior Pelvic Floor Sacrospinus Repair. Anterior wall of the vagina.   ESOPHAGOGASTRODUODENOSCOPY N/A 02/10/2014   Surgeon: Danie Binder, MD;   Mild gastritis/duodenitis s/p biopsied, no obvious source for anemia.  Pathology with mild chronic gastritis with intestinal metaplasia, negative for H. pylori, duodenal biopsy benign.   GIVENS CAPSULE STUDY N/A 03/13/2014   Surgeon: Danie Binder, MD;  Normal exam.  Suspected anemia most likely due to chronic disease.   JOINT REPLACEMENT N/A    Phreesia 06/16/2020   NECK SURGERY  2007   RIGHT PARTIAL MASTECTOMY  11-11-2004  DR PETER YOUNG   DUCTAL CARCINOMA IN SITU RIGHT BREAST   TOTAL ABDOMINAL HYSTERECTOMY W/ BILATERAL SALPINGOOPHORECTOMY  1990   TOTAL KNEE ARTHROPLASTY Right 12/21/2019   Procedure: RIGHT  TOTAL KNEE ARTHROPLASTY;  Surgeon: [name], MD;  Location: [facility];  Service: Orthopedics;  Laterality: Right;   TRANSTHORACIC ECHOCARDIOGRAM  03-30-2008  DR MARGARET SIMPSON   LV SIZE AND FUNCTION NORMAL/ MODERATE AORTIC ARCH DILATATION/ MILD MR   Patient Active Problem List   Diagnosis Date Noted   Stage 3b chronic kidney disease (CKD) ([place]) 03/20/2022   AKI (acute kidney injury) ([place]) 03/19/2022   Iron deficiency anemia 01/30/2022   Infected tooth 09/11/2021   Vertigo 07/10/2021   Unilateral primary osteoarthritis, left knee 09/13/2020   S/P TKR (total knee replacement), right 12/21/2019   Fatigue 10/18/2019   Left shoulder pain 04/11/2019   Ganglion cyst of volar aspect of left wrist 10/05/2018   Allergic cough  05/14/2016   Neck pain on left side 01/01/2016   Left ventricular outflow tract obstruction 70/96/2836   Metabolic syndrome X 62/94/7654   Osteopenia 12/12/2013   Anemia 11/29/2013   Morbid obesity ([place]) 10/17/2012   Aneurysm of thoracic aorta ([place]) 10/12/2012   Allergic rhinitis 03/02/2012   Disorder of bladder 09/24/2010   Malignant neoplasm of female breast ([place]) 02/04/2008   Hyperlipidemia 02/04/2008   Essential hypertension 02/04/2008    ONSET DATE: Ongoing pain for 3+ years  REFERRING DIAG: shoulder glenohumeral osteoarthritis   THERAPY DIAG:  Chronic left shoulder pain  Other symptoms and signs involving the musculoskeletal system  Rationale for Evaluation and Treatment Rehabilitation  SUBJECTIVE:   SUBJECTIVE STATEMENT: S: "I feel like the past few weeks my pain is just getting worse and worse."  PRECAUTIONS: None  WEIGHT BEARING RESTRICTIONS No  PAIN:  Are you having pain? Yes: NPRS scale: 7/10 Pain location: Entire shoulder and proximal arm Pain description: throbbing pain  Aggravating factors: movement  Relieving factors: tylenol   PATIENT GOALS "Less pain."  OBJECTIVE:   FUNCTIONAL OUTCOME MEASURES: FOTO: 48.43  UPPER EXTREMITY ROM     Active ROM Left eval  Shoulder flexion 102  Shoulder abduction 100  Shoulder external rotation 22  (Blank rows = not tested)  Passive ROM Left eval  Shoulder flexion 98  Shoulder abduction 103  Shoulder external rotation 30  (Blank rows = not tested)   UPPER EXTREMITY MMT:     MMT Left eval  Shoulder flexion 3+/5  Shoulder abduction 3/5  Shoulder internal rotation 4-/5  Shoulder external rotation 3/5  (Blank rows = not tested)  HAND FUNCTION: Grip strength: Right: 62 lbs; Left: 52 lbs   COGNITION: Overall cognitive status: Within functional limits for tasks assessed   OBSERVATIONS: Fascial restrictions throughout upper quadrant-trapezius, axillary region, deltoids    TODAY'S TREATMENT:   05/14/22 -Manual Therapy: myofascial release and trigger point on entire LUE to address pain, fascial restrictions and improve ROM.  -P/ROM: Flexion, abduction, Horizontal abduction/adduction, er/IR, 1x8 with prolonged stretch at end ranges -A/ROM: supine, flexion, abduction, protraction, horizontal abduction, er/IR, 1x10 -Proximal Shoulder strengthening:supine,  ABC's in the sky, x1 -Wall Washing: flexion and abduction, 1x10 with prolonged stretch in end range -Ball on the Wall: red weighted ball, up/down and side to side, 2x45", in flexion -Scapula ROM: retraction to protraction, rows, 1x10   PATIENT EDUCATION: Education details: A/ROM and wall slides Person educated: Patient Education method: Explanation, Demonstration, and Handouts Education comprehension: verbalized understanding and returned demonstration   HOME EXERCISE PROGRAM: 05/14/22: A/ROM and wall slides  GOALS: Goals reviewed with patient? Yes  SHORT TERM GOALS: Target date:  06/11/22     Pt will be provided with and educated on HEP to improve mobility in LUE required for ADL completion.   Goal status: INITIAL  2.  Pt will decrease pain in LUE to 3/10 or less in order to sleep through the night without waking due to pain.   Goal status: INITIAL  3.  Pt will decrease LUE fascial restrictions to minimal amounts or less to improve mobility required for overhead reaching tasks during meal preparation.   Goal status: INITIAL  4.  Pt will increase A/ROM of LUE to [facility] to improve ability to reach overhead and behind back during dressing and bathing tasks.   Goal status: INITIAL  5.  Pt will increase strength in LUE to 4/5 to improve ability to perform lifting tasks required for carrying groceries and putting them over in upper cabinets.    Goal status: INITIAL    ASSESSMENT:  CLINICAL IMPRESSION: A: Pt demonstrating continued pain and fascial restrictions in the shoulder girdle, trapezius, axillary region,  biceps, and deltoids. Pt is having pain when completing A/ROM for flexion and abduction near her end ranges, especially around the bicep and deltoid tendons. Therapist initiated proximal shoulder strengthening this session, to which pt fatigued quickly, require rest breaks after each rep. Therapist encouraged active rest breaks of stretching and pendulums to assist the shoulder in relaxing. Additionally, pt requiring mod verbal and tactile cuing for compensatory strategies and proper mechanics throughout each task.   PLAN: OT FREQUENCY: 1x/week  OT DURATION: 6 weeks  PLANNED INTERVENTIONS: self care/ADL training, therapeutic exercise, therapeutic activity, manual therapy, passive range of motion, electrical stimulation, ultrasound, moist heat, cryotherapy, patient/family education, cognitive remediation/compensation, energy conservation, coping strategies training, and DME and/or AE instructions  CONSULTED AND AGREED WITH PLAN OF CARE: Patient  PLAN FOR NEXT SESSION: P/ROM, AA/ROM, A/ROM, manual, shoulder strengthening, Start scapula strengthening   Paulita Fujita, OTR/L [phone]  05/14/2022, 1:35 PM

## 2022-05-14 NOTE — Patient Instructions (Signed)

## 2022-05-21 ENCOUNTER — Encounter (HOSPITAL_COMMUNITY): Payer: PPO | Admitting: Physical Therapy

## 2022-05-21 ENCOUNTER — Encounter (HOSPITAL_COMMUNITY): Payer: PPO | Admitting: Occupational Therapy

## 2022-05-21 DIAGNOSIS — M19012 Primary osteoarthritis, left shoulder: Secondary | ICD-10-CM | POA: Diagnosis not present

## 2022-05-22 ENCOUNTER — Ambulatory Visit (HOSPITAL_COMMUNITY): Payer: PPO | Admitting: Occupational Therapy

## 2022-05-22 ENCOUNTER — Encounter (HOSPITAL_COMMUNITY): Payer: Self-pay | Admitting: Occupational Therapy

## 2022-05-22 DIAGNOSIS — M542 Cervicalgia: Secondary | ICD-10-CM | POA: Diagnosis not present

## 2022-05-22 DIAGNOSIS — G8929 Other chronic pain: Secondary | ICD-10-CM

## 2022-05-22 DIAGNOSIS — R29898 Other symptoms and signs involving the musculoskeletal system: Secondary | ICD-10-CM

## 2022-05-22 NOTE — Patient Instructions (Signed)

## 2022-05-22 NOTE — Therapy (Signed)
OUTPATIENT OCCUPATIONAL THERAPY ORTHO TREATMENT  Patient Name: Desiree [name] MRN: [id number] DOB:Jun 12, 1946, 76 y.o., female Today's Date: 05/22/2022  PCP: Tula Nakayama, MD REFERRING PROVIDER: Tania Ade, MD   OT End of Session - 05/22/22 1344     Visit Number 3    Number of Visits 6    Date for OT Re-Evaluation 06/11/22    Authorization Type 1) [employer]    2) Medicaid of [place]    Authorization Time Period no auth, no visit limit    OT Start Time 1300    OT Stop Time 1343    OT Time Calculation (min) 43 min    Activity Tolerance Patient tolerated treatment well    Behavior During Therapy WFL for tasks assessed/performed               Past Medical History:  Diagnosis Date   Allergic rhinitis    Arthritis HIPS/ WRISTS   Cancer ([place])    DDD (degenerative disc disease), cervical    GERD (gastroesophageal reflux disease) OCCASIONAL   Heart murmur    turbulent flow through LVOT, no obstruction 08/26/18 echo   History of Bell's palsy 2011  RIGHT SIDE -- RESOLVED   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--  S/P  RIGHT MASTECTMOY AND RADIATION--  NO RECURRANCE   History of CVA (cerebrovascular accident) PER SCAN IN 2011   Hyperlipidemia    Hypertension    Impaired fasting glucose PER PCP  DR Moshe Cipro   WATCH DIET   Mixed urge and stress incontinence    Nocturia    Sinus drainage    Thoracic ascending aortic aneurysm ([place]) LAST CHEST CT 09-02-2010--  FOLLOWED BY  CARDIOLOGIST--  DR [id number]   Vaginal wall prolapse    Past Surgical History:  Procedure Laterality Date   ABDOMINAL HYSTERECTOMY     BILATERAL CARPAL TUNNEL RELEASE  1980'S   CARDIAC CATHETERIZATION  02-01-2009  DR [place]   NORMAL CORONARY ARTERIES/ NORMAL LV SIZE AND FUNCTION/ AORTIC ROOT SIZE AT THE UPPER LIMIT OF NORMAL    CARPAL TUNNEL RELEASE     CERVICAL [place] SURGERY  12-12-2005  DR Vertell Limber   LEFT  C6 - C7 HERINATED / DDD/ SPONDYLOSIS   COLONOSCOPY N/A 02/10/2014    Surgeon: Danie Binder, MD; Normal TI, mild diverticulosis in sigmoid and transverse colon, small external hemorrhoids, no obvious source for anemia.  Repeat colonoscopy in 10-15 years if benefits outweigh risk.   CYSTOCELE REPAIR  08/02/2012   Procedure: ANTERIOR REPAIR (CYSTOCELE);  Surgeon: Ailene Rud, MD;  [facility];  Service: Urology;  Laterality: N/A;  [employer] Uphold Anterior Pelvic Floor Sacrospinus Repair. Anterior wall of the vagina.   ESOPHAGOGASTRODUODENOSCOPY N/A 02/10/2014   Surgeon: Danie Binder, MD;   Mild gastritis/duodenitis s/p biopsied, no obvious source for anemia.  Pathology with mild chronic gastritis with intestinal metaplasia, negative for H. pylori, duodenal biopsy benign.   GIVENS CAPSULE STUDY N/A 03/13/2014   Surgeon: Danie Binder, MD;  Normal exam.  Suspected anemia most likely due to chronic disease.   JOINT REPLACEMENT N/A    Phreesia 06/16/2020   NECK SURGERY  2007   RIGHT PARTIAL MASTECTOMY  11-11-2004  DR PETER YOUNG   DUCTAL CARCINOMA IN SITU RIGHT BREAST   TOTAL ABDOMINAL HYSTERECTOMY W/ BILATERAL SALPINGOOPHORECTOMY  1990   TOTAL KNEE ARTHROPLASTY Right 12/21/2019   Procedure: RIGHT  TOTAL KNEE ARTHROPLASTY;  Surgeon: [name], MD;  Location: [facility]  OR;  Service: Orthopedics;  Laterality: Right;   TRANSTHORACIC ECHOCARDIOGRAM  03-30-2008  DR MARGARET SIMPSON   LV SIZE AND FUNCTION NORMAL/ MODERATE AORTIC ARCH DILATATION/ MILD MR   Patient Active Problem List   Diagnosis Date Noted   Stage 3b chronic kidney disease (CKD) ([place]) 03/20/2022   AKI (acute kidney injury) ([place]) 03/19/2022   Iron deficiency anemia 01/30/2022   Infected tooth 09/11/2021   Vertigo 07/10/2021   Unilateral primary osteoarthritis, left knee 09/13/2020   S/P TKR (total knee replacement), right 12/21/2019   Fatigue 10/18/2019   Left shoulder pain 04/11/2019   Ganglion cyst of volar aspect of left wrist 10/05/2018   Allergic cough  05/14/2016   Neck pain on left side 01/01/2016   Left ventricular outflow tract obstruction 37/05/6268   Metabolic syndrome X 48/54/6270   Osteopenia 12/12/2013   Anemia 11/29/2013   Morbid obesity ([place]) 10/17/2012   Aneurysm of thoracic aorta ([place]) 10/12/2012   Allergic rhinitis 03/02/2012   Disorder of bladder 09/24/2010   Malignant neoplasm of female breast ([place]) 02/04/2008   Hyperlipidemia 02/04/2008   Essential hypertension 02/04/2008    ONSET DATE: Ongoing pain for 3+ years  REFERRING DIAG: shoulder glenohumeral osteoarthritis   THERAPY DIAG:  Chronic left shoulder pain  Other symptoms and signs involving the musculoskeletal system  Rationale for Evaluation and Treatment Rehabilitation  SUBJECTIVE:   SUBJECTIVE STATEMENT: S: "The pain depends on how I move it."   PRECAUTIONS: None  WEIGHT BEARING RESTRICTIONS No  PAIN:  Are you having pain? No Pain with movement  PATIENT GOALS "Less pain."  OBJECTIVE:   FUNCTIONAL OUTCOME MEASURES: FOTO: 48.43  UPPER EXTREMITY ROM     Active ROM Left eval  Shoulder flexion 102  Shoulder abduction 100  Shoulder external rotation 22  (Blank rows = not tested)  Passive ROM Left eval  Shoulder flexion 98  Shoulder abduction 103  Shoulder external rotation 30  (Blank rows = not tested)   UPPER EXTREMITY MMT:     MMT Left eval  Shoulder flexion 3+/5  Shoulder abduction 3/5  Shoulder internal rotation 4-/5  Shoulder external rotation 3/5  (Blank rows = not tested)  HAND FUNCTION: Grip strength: Right: 62 lbs; Left: 52 lbs   OBSERVATIONS: Fascial restrictions throughout upper quadrant-trapezius, axillary region, deltoids    TODAY'S TREATMENT:  05/22/22 -Manual Therapy: myofascial release and trigger point on entire LUE to address pain, fascial restrictions and improve ROM.  -P/ROM: Flexion, abduction, Horizontal abduction/adduction, er/IR, 1x5 with prolonged stretch at end ranges -Muscle energy  technique- supine, flexion, er, 10" holds then stretching to end ROM, 2X  -A/ROM: standing- flexion, abduction, protraction, horizontal abduction, er/IR, 1x10 -Shoulder stretches: flexion, IR behind back with vertical towel, doorway stretch, er stretch at wall, posterior capsule stretch, 2x10" each -Wall wash: 1' flexion -Pulleys: 1' flexion, 1' abduction -UBE: level 1, 2' forward 2' reverse, pace: 3.5  05/14/22 -Manual Therapy: myofascial release and trigger point on entire LUE to address pain, fascial restrictions and improve ROM.  -P/ROM: Flexion, abduction, Horizontal abduction/adduction, er/IR, 1x8 with prolonged stretch at end ranges -A/ROM: supine, flexion, abduction, protraction, horizontal abduction, er/IR, 1x10 -Proximal Shoulder strengthening:supine,  ABC's in the sky, x1 -Wall Washing: flexion and abduction, 1x10 with prolonged stretch in end range -Ball on the Wall: red weighted ball, up/down and side to side, 2x45", in flexion -Scapula ROM: retraction to protraction, rows, 1x10   PATIENT EDUCATION: Education details: shoulder stretches Person educated: Patient Education method: Explanation, Media planner, and Handouts Education  comprehension: verbalized understanding and returned demonstration   HOME EXERCISE PROGRAM: 05/14/22: A/ROM and wall slides 05/22/22: Shoulder stretches  GOALS: Goals reviewed with patient? Yes  SHORT TERM GOALS: Target date:  06/11/22     Pt will be provided with and educated on HEP to improve mobility in LUE required for ADL completion.   Goal status: IN PROGRESS  2.  Pt will decrease pain in LUE to 3/10 or less in order to sleep through the night without waking due to pain.   Goal status: IN PROGRESS  3.  Pt will decrease LUE fascial restrictions to minimal amounts or less to improve mobility required for overhead reaching tasks during meal preparation.   Goal status: IN PROGRESS  4.  Pt will increase A/ROM of LUE to [facility] to improve  ability to reach overhead and behind back during dressing and bathing tasks.   Goal status: IN PROGRESS  5.  Pt will increase strength in LUE to 4/5 to improve ability to perform lifting tasks required for carrying groceries and putting them over in upper cabinets.    Goal status: IN PROGRESS    ASSESSMENT:  CLINICAL IMPRESSION: A: Pt reports min pain at rest, increases with use. Continued with myofascial release to LUE and scapular regions, min restrictions in trapezius and scapular areas, mod tightness noted along bicep and upper arm. Continued with passive stretching and added muscle energy technique for flexors and external rotators, some ROM increases noted with next passive stretch. Completed A/ROM in standing with pt noted to have increased ROM from eval, today approximately 60-70%. Added shoulder stretches at wall, pulleys, and UBE. Pt with mod resistance during passive stretching, cuing for relaxing and reducing guarding. Able to complete tasks with min fatigue, verbal cuing for form and technique.    PLAN: OT FREQUENCY: 1x/week  OT DURATION: 6 weeks  PLANNED INTERVENTIONS: self care/ADL training, therapeutic exercise, therapeutic activity, manual therapy, passive range of motion, electrical stimulation, ultrasound, moist heat, cryotherapy, patient/family education, cognitive remediation/compensation, energy conservation, coping strategies training, and DME and/or AE instructions  CONSULTED AND AGREED WITH PLAN OF CARE: Patient  PLAN FOR NEXT SESSION: P/ROM and muscle energy technique, A/ROM, proximal shoulder strengthening, therapy ball wall stretch      Guadelupe Sabin, OTR/L  [phone]  05/22/2022, 1:45 PM

## 2022-05-27 ENCOUNTER — Ambulatory Visit (HOSPITAL_COMMUNITY): Payer: PPO | Admitting: Occupational Therapy

## 2022-05-27 ENCOUNTER — Encounter (HOSPITAL_COMMUNITY): Payer: Self-pay | Admitting: Occupational Therapy

## 2022-05-27 ENCOUNTER — Encounter (HOSPITAL_COMMUNITY): Payer: Self-pay | Admitting: Physical Therapy

## 2022-05-27 ENCOUNTER — Ambulatory Visit (HOSPITAL_COMMUNITY): Payer: PPO | Attending: Neurological Surgery | Admitting: Physical Therapy

## 2022-05-27 DIAGNOSIS — M25512 Pain in left shoulder: Secondary | ICD-10-CM | POA: Diagnosis not present

## 2022-05-27 DIAGNOSIS — G8929 Other chronic pain: Secondary | ICD-10-CM | POA: Insufficient documentation

## 2022-05-27 DIAGNOSIS — R29898 Other symptoms and signs involving the musculoskeletal system: Secondary | ICD-10-CM | POA: Insufficient documentation

## 2022-05-27 DIAGNOSIS — M542 Cervicalgia: Secondary | ICD-10-CM | POA: Diagnosis not present

## 2022-05-27 NOTE — Patient Instructions (Signed)

## 2022-05-27 NOTE — Therapy (Signed)
OUTPATIENT OCCUPATIONAL THERAPY ORTHO TREATMENT  Patient Name: Desiree [name] MRN: [id number] DOB:1946/03/27, 76 y.o., female Today's Date: 05/27/2022  PCP: Tula Nakayama, MD REFERRING PROVIDER: Tania Ade, MD   OT End of Session - 05/27/22 1118     Visit Number 4    Number of Visits 7    Date for OT Re-Evaluation 06/11/22    Authorization Type 1) [employer]    2) Medicaid of [place]    Authorization Time Period no auth, no visit limit    OT Start Time 1030    OT Stop Time 1114    OT Time Calculation (min) 44 min    Activity Tolerance Patient tolerated treatment well    Behavior During Therapy WFL for tasks assessed/performed              Past Medical History:  Diagnosis Date   Allergic rhinitis    Arthritis HIPS/ WRISTS   Cancer ([place])    DDD (degenerative disc disease), cervical    GERD (gastroesophageal reflux disease) OCCASIONAL   Heart murmur    turbulent flow through LVOT, no obstruction 08/26/18 echo   History of Bell's palsy 2011  RIGHT SIDE -- RESOLVED   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--  S/P  RIGHT MASTECTMOY AND RADIATION--  NO RECURRANCE   History of CVA (cerebrovascular accident) PER SCAN IN 2011   Hyperlipidemia    Hypertension    Impaired fasting glucose PER PCP  DR Moshe Cipro   WATCH DIET   Mixed urge and stress incontinence    Nocturia    Sinus drainage    Thoracic ascending aortic aneurysm ([place]) LAST CHEST CT 09-02-2010--  FOLLOWED BY  CARDIOLOGIST--  DR [id number]   Vaginal wall prolapse    Past Surgical History:  Procedure Laterality Date   ABDOMINAL HYSTERECTOMY     BILATERAL CARPAL TUNNEL RELEASE  1980'S   CARDIAC CATHETERIZATION  02-01-2009  DR [place]   NORMAL CORONARY ARTERIES/ NORMAL LV SIZE AND FUNCTION/ AORTIC ROOT SIZE AT THE UPPER LIMIT OF NORMAL    CARPAL TUNNEL RELEASE     CERVICAL [place] SURGERY  12-12-2005  DR Vertell Limber   LEFT  C6 - C7 HERINATED / DDD/ SPONDYLOSIS   COLONOSCOPY N/A 02/10/2014    Surgeon: Danie Binder, MD; Normal TI, mild diverticulosis in sigmoid and transverse colon, small external hemorrhoids, no obvious source for anemia.  Repeat colonoscopy in 10-15 years if benefits outweigh risk.   CYSTOCELE REPAIR  08/02/2012   Procedure: ANTERIOR REPAIR (CYSTOCELE);  Surgeon: Ailene Rud, MD;  Location[facility];  Service: Urology;  Laterality: N/A;  [employer] Uphold Anterior Pelvic Floor Sacrospinus Repair. Anterior wall of the vagina.   ESOPHAGOGASTRODUODENOSCOPY N/A 02/10/2014   Surgeon: Danie Binder, MD;   Mild gastritis/duodenitis s/p biopsied, no obvious source for anemia.  Pathology with mild chronic gastritis with intestinal metaplasia, negative for H. pylori, duodenal biopsy benign.   GIVENS CAPSULE STUDY N/A 03/13/2014   Surgeon: Danie Binder, MD;  Normal exam.  Suspected anemia most likely due to chronic disease.   JOINT REPLACEMENT N/A    [employer]reesia 06/16/2020   NECK SURGERY  2007   RIGHT PARTIAL MASTECTOMY  11-11-2004  DR PETER YOUNG   DUCTAL CARCINOMA IN SITU RIGHT BREAST   TOTAL ABDOMINAL HYSTERECTOMY W/ BILATERAL SALPINGOOPHORECTOMY  1990   TOTAL KNEE ARTHROPLASTY Right 12/21/2019   Procedure: RIGHT  TOTAL KNEE ARTHROPLASTY;  Surgeon: [name], MD;  Location: [place];  Service: Orthopedics;  Laterality: Right;   TRANSTHORACIC ECHOCARDIOGRAM  03-30-2008  DR MARGARET SIMPSON   LV SIZE AND FUNCTION NORMAL/ MODERATE AORTIC ARCH DILATATION/ MILD MR   Patient Active Problem List   Diagnosis Date Noted   Stage 3b chronic kidney disease (CKD) ([place]) 03/20/2022   AKI (acute kidney injury) ([place]) 03/19/2022   Iron deficiency anemia 01/30/2022   Infected tooth 09/11/2021   Vertigo 07/10/2021   Unilateral primary osteoarthritis, left knee 09/13/2020   S/P TKR (total knee replacement), right 12/21/2019   Fatigue 10/18/2019   Left shoulder pain 04/11/2019   Ganglion cyst of volar aspect of left wrist 10/05/2018   Allergic cough  05/14/2016   Neck pain on left side 01/01/2016   Left ventricular outflow tract obstruction 09/02/3233   Metabolic syndrome X 57/32/2025   Osteopenia 12/12/2013   Anemia 11/29/2013   Morbid obesity ([place]) 10/17/2012   Aneurysm of thoracic aorta ([place]) 10/12/2012   Allergic rhinitis 03/02/2012   Disorder of bladder 09/24/2010   Malignant neoplasm of female breast ([place]) 02/04/2008   Hyperlipidemia 02/04/2008   Essential hypertension 02/04/2008    ONSET DATE: Ongoing pain for 3+ years  REFERRING DIAG: shoulder glenohumeral osteoarthritis   THERAPY DIAG:  Chronic left shoulder pain  Other symptoms and signs involving the musculoskeletal system  Rationale for Evaluation and Treatment Rehabilitation  SUBJECTIVE:   SUBJECTIVE STATEMENT: S: "The pain depends on how I move it."   PRECAUTIONS: None  WEIGHT BEARING RESTRICTIONS No  PAIN:  Are you having pain? No Pain with movement  PATIENT GOALS "Less pain."  OBJECTIVE:   FUNCTIONAL OUTCOME MEASURES: FOTO: 48.43  UPPER EXTREMITY ROM     Active ROM Left eval  Shoulder flexion 102  Shoulder abduction 100  Shoulder external rotation 22  (Blank rows = not tested)  Passive ROM Left eval  Shoulder flexion 98  Shoulder abduction 103  Shoulder external rotation 30  (Blank rows = not tested)   UPPER EXTREMITY MMT:     MMT Left eval  Shoulder flexion 3+/5  Shoulder abduction 3/5  Shoulder internal rotation 4-/5  Shoulder external rotation 3/5  (Blank rows = not tested)  HAND FUNCTION: Grip strength: Right: 62 lbs; Left: 52 lbs   OBSERVATIONS: Fascial restrictions throughout upper quadrant-trapezius, axillary region, deltoids    TODAY'S TREATMENT:    05/27/22 -Manual Therapy: myofascial release and trigger point on entire LUE to address pain, fascial restrictions and improve ROM.  -AA/ROM: 3lb dowel, supine, flexion, abduction, horizontal abduction, protraction, er/IR, 1x10 -A/ROM: supine, flexion,  abduction, horizontal abduction, protraction, er/IR, 1x10 -Strengthening: 2lb dumbbell, side lying, flexion, extension, abduction, horizontal abduction, er/IR, 1x10 -Scapula Strengthening: Red theraband, extension, rows, retraction, 1x10  05/22/22 -Manual Therapy: myofascial release and trigger point on entire LUE to address pain, fascial restrictions and improve ROM.  -P/ROM: Flexion, abduction, Horizontal abduction/adduction, er/IR, 1x5 with prolonged stretch at end ranges -Muscle energy technique- supine, flexion, er, 10" holds then stretching to end ROM, 2X  -A/ROM: standing- flexion, abduction, protraction, horizontal abduction, er/IR, 1x10 -Shoulder stretches: flexion, IR behind back with vertical towel, doorway stretch, er stretch at wall, posterior capsule stretch, 2x10" each -Wall wash: 1' flexion -Pulleys: 1' flexion, 1' abduction -UBE: level 1, 2' forward 2' reverse, pace: 3.5  05/14/22 -Manual Therapy: myofascial release and trigger point on entire LUE to address pain, fascial restrictions and improve ROM.  -P/ROM: Flexion, abduction, Horizontal abduction/adduction, er/IR, 1x8 with prolonged stretch at end ranges -A/ROM: supine, flexion, abduction, protraction, horizontal abduction, er/IR, 1x10 -  Proximal Shoulder strengthening:supine,  ABC's in the sky, x1 -Wall Washing: flexion and abduction, 1x10 with prolonged stretch in end range -Ball on the Wall: red weighted ball, up/down and side to side, 2x45", in flexion -Scapula ROM: retraction to protraction, rows, 1x10   PATIENT EDUCATION: Education details: Physiological scientist Person educated: Patient Education method: Explanation, Demonstration, and Handouts Education comprehension: verbalized understanding and returned demonstration   HOME EXERCISE PROGRAM: 05/14/22: A/ROM and wall slides 05/22/22: Shoulder stretches 05/27/22: Scapula Strengthening  GOALS: Goals reviewed with patient? Yes  SHORT TERM GOALS: Target date:   06/11/22     Pt will be provided with and educated on HEP to improve mobility in LUE required for ADL completion.   Goal status: IN PROGRESS  2.  Pt will decrease pain in LUE to 3/10 or less in order to sleep through the night without waking due to pain.   Goal status: IN PROGRESS  3.  Pt will decrease LUE fascial restrictions to minimal amounts or less to improve mobility required for overhead reaching tasks during meal preparation.   Goal status: IN PROGRESS  4.  Pt will increase A/ROM of LUE to [facility] to improve ability to reach overhead and behind back during dressing and bathing tasks.   Goal status: IN PROGRESS  5.  Pt will increase strength in LUE to 4/5 to improve ability to perform lifting tasks required for carrying groceries and putting them over in upper cabinets.    Goal status: IN PROGRESS    ASSESSMENT:  CLINICAL IMPRESSION: A: Pt continues to report moderate pain around 4-5/10, with minimal relief, she has severe fascial restriction in there upper trapezius, biceps, pectoralis, and anterior axillary region, all addressed with manual therapy. This session focused on ROM and slowly progressing strength. Pt continuing to have increased pain when working on pushing ROM in end range. Therapist providing mod verbal and tactile cuing to reduce compensatory strategies and improve positioning and proper mechanics. Pt reporting minimal fatigue with exercises, only pain increasing to 6/10 with exercises. Pt will continue to benefit from skilled OT to maximize strength and ROM in order to improve ADL and IADL's.   PLAN: OT FREQUENCY: 1x/week  OT DURATION: 6 weeks  PLANNED INTERVENTIONS: self care/ADL training, therapeutic exercise, therapeutic activity, manual therapy, passive range of motion, electrical stimulation, ultrasound, moist heat, cryotherapy, patient/family education, cognitive remediation/compensation, energy conservation, coping strategies training, and DME and/or AE  instructions  CONSULTED AND AGREED WITH PLAN OF CARE: Patient  PLAN FOR NEXT SESSION: P/ROM and muscle energy technique, A/ROM, proximal shoulder strengthening, therapy ball wall stretch      Paulita Fujita, OTR/L [phone]  05/27/2022, 11:19 AM

## 2022-05-27 NOTE — Therapy (Signed)
OUTPATIENT PHYSICAL THERAPY CERVICAL TREATMENT   Patient Name: Desiree [name] MRN: [id number] DOB:[birth year], 76 y.o., female Today's Date: 05/27/2022   PT End of Session - 05/27/22 1120     Visit Number 5    Number of Visits 6    Date for PT Re-Evaluation 06/11/22    Authorization Type [employer]/ Medicaid of [place]; no VL; no auth    Progress Note Due on Visit 10    PT Start Time 1116    PT Stop Time 0932    PT Time Calculation (min) 43 min    Activity Tolerance Patient tolerated treatment well    Behavior During Therapy WFL for tasks assessed/performed                Past Medical History:  Diagnosis Date   Allergic rhinitis    Arthritis HIPS/ WRISTS   Cancer ([place])    DDD (degenerative disc disease), cervical    GERD (gastroesophageal reflux disease) OCCASIONAL   Heart murmur    turbulent flow through LVOT, no obstruction 08/26/18 echo   History of Bell's palsy 2011  RIGHT SIDE -- RESOLVED   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--  S/P  RIGHT MASTECTMOY AND RADIATION--  NO RECURRANCE   History of CVA (cerebrovascular accident) PER SCAN IN 2011   Hyperlipidemia    Hypertension    Impaired fasting glucose PER PCP  DR Moshe Cipro   WATCH DIET   Mixed urge and stress incontinence    Nocturia    Sinus drainage    Thoracic ascending aortic aneurysm ([place]) LAST CHEST CT 09-02-2010--  FOLLOWED BY  CARDIOLOGIST--  DR [id number]   Vaginal wall prolapse    Past Surgical History:  Procedure Laterality Date   ABDOMINAL HYSTERECTOMY     BILATERAL CARPAL TUNNEL RELEASE  1980'S   CARDIAC CATHETERIZATION  02-01-2009  DR Achille   NORMAL CORONARY ARTERIES/ NORMAL LV SIZE AND FUNCTION/ AORTIC ROOT SIZE AT THE UPPER LIMIT OF NORMAL    CARPAL TUNNEL RELEASE     CERVICAL [place] SURGERY  12-12-2005  DR Vertell Limber   LEFT  C6 - C7 HERINATED / DDD/ SPONDYLOSIS   COLONOSCOPY N/A 02/10/2014   Surgeon: Danie Binder, MD; Normal TI, mild diverticulosis in sigmoid and  transverse colon, small external hemorrhoids, no obvious source for anemia.  Repeat colonoscopy in 10-15 years if benefits outweigh risk.   CYSTOCELE REPAIR  08/02/2012   Procedure: ANTERIOR REPAIR (CYSTOCELE);  Surgeon: Ailene Rud, MD;  Location: [facility];  Service: Urology;  Laterality: N/A;  [employer] Uphold Anterior Pelvic Floor Sacrospinus Repair. Anterior wall of the vagina.   ESOPHAGOGASTRODUODENOSCOPY N/A 02/10/2014   Surgeon: Danie Binder, MD;   Mild gastritis/duodenitis s/p biopsied, no obvious source for anemia.  Pathology with mild chronic gastritis with intestinal metaplasia, negative for H. pylori, duodenal biopsy benign.   GIVENS CAPSULE STUDY N/A 03/13/2014   Surgeon: Danie Binder, MD;  Normal exam.  Suspected anemia most likely due to chronic disease.   JOINT REPLACEMENT N/A    [employer] 06/16/2020   NECK SURGERY  2007   RIGHT PARTIAL MASTECTOMY  11-11-2004  DR PETER YOUNG   DUCTAL CARCINOMA IN SITU RIGHT BREAST   TOTAL ABDOMINAL HYSTERECTOMY W/ BILATERAL SALPINGOOPHORECTOMY  1990   TOTAL KNEE ARTHROPLASTY Right 12/21/2019   Procedure: RIGHT  TOTAL KNEE ARTHROPLASTY;  Surgeon: [name], MD;  Location: [place];  Service: Orthopedics;  Laterality: Right;   TRANSTHORACIC ECHOCARDIOGRAM  03-30-2008  DR MARGARET SIMPSON   LV SIZE AND FUNCTION NORMAL/ MODERATE AORTIC ARCH DILATATION/ MILD MR   Patient Active Problem List   Diagnosis Date Noted   Stage 3b chronic kidney disease (CKD) ([place]) 03/20/2022   AKI (acute kidney injury) ([place]) 03/19/2022   Iron deficiency anemia 01/30/2022   Infected tooth 09/11/2021   Vertigo 07/10/2021   Unilateral primary osteoarthritis, left knee 09/13/2020   S/P TKR (total knee replacement), right 12/21/2019   Fatigue 10/18/2019   Left shoulder pain 04/11/2019   Ganglion cyst of volar aspect of left wrist 10/05/2018   Allergic cough 05/14/2016   Neck pain on left side 01/01/2016   Left ventricular outflow  tract obstruction 94/49/6759   Metabolic syndrome X 16/38/4665   Osteopenia 12/12/2013   Anemia 11/29/2013   Morbid obesity ([place]) 10/17/2012   Aneurysm of thoracic aorta ([place]) 10/12/2012   Allergic rhinitis 03/02/2012   Disorder of bladder 09/24/2010   Malignant neoplasm of female breast ([place]) 02/04/2008   Hyperlipidemia 02/04/2008   Essential hypertension 02/04/2008    PCP: [name], MD  REFERRING PROVIDER: PT eval/tx for M54.2 cervicalgia per [name], DO needs dry needling  REFERRING DIAG: PT eval/tx for M54.2 cervicalgia per [name], DO needs dry needling   THERAPY DIAG:  Other symptoms and signs involving the musculoskeletal system  Neck pain  Rationale for Evaluation and Treatment Rehabilitation  ONSET DATE: several months  SUBJECTIVE:                                                                                                                                                                                                         SUBJECTIVE STATEMENT: Reports neck is feeling well today. A bit fatigued from OT session working on her shoulder this morning.      PERTINENT HISTORY:  Neck surgery 2007; discectomy per Dr. Vertell Limber CPT bilaterally  PAIN:  Are you having pain? Yes: NPRS scale: 1/10 Pain location: left side of neck Pain description: tight and achey Aggravating factors: unknown Relieving factors: Tylenol OCCUPATION: retired  PLOF: Independent  PATIENT GOALS less pain in my neck  OBJECTIVE:   DIAGNOSTIC FINDINGS:  CLINICAL DATA:  Neck pain with cervical spondylosis and disc disease with worsening pain for 6 months. Left arm pain with limited range of motion.   EXAM: MRI CERVICAL SPINE WITHOUT AND WITH CONTRAST   TECHNIQUE: Multiplanar and multiecho pulse sequences of the cervical spine, to include the craniocervical junction and cervicothoracic junction, were obtained without and with intravenous contrast.   CONTRAST:   62m  GADAVIST GADOBUTROL 1 MMOL/ML IV SOLN   COMPARISON:  Cervical spine MRI 10/02/2014   FINDINGS: Alignment: There is slight reversal of the normal cervical curvature centered at C4-C5, similar to the prior study. There is no antero or retrolisthesis.   Vertebrae: Vertebral body heights are preserved. Background marrow signal is normal. There is mild degenerative endplate marrow signal abnormality at C3-C4. There is no suspicious marrow signal abnormality, marrow edema or abnormal marrow enhancement.   Cord: Normal in signal and morphology.   Posterior Fossa, vertebral arteries, paraspinal tissues: The imaged posterior fossa is unremarkable. The vertebral artery flow voids are normal. The paraspinal soft tissues are unremarkable.   Disc levels:   There is disc desiccation and narrowing at C3-C4 through C6-C7, overall slightly progressed in the interim.   C2-C3: No significant spinal canal or neural foraminal stenosis.   C3-C4: There is a shallow posterior disc osteophyte complex with bilateral uncovertebral ridging and facet arthropathy resulting in moderate right worse than left neural foraminal stenosis without significant spinal canal stenosis, progressed since 2016.   C4-C5: There is a shallow posterior disc osteophyte complex with bilateral uncovertebral ridging and mild facet arthropathy resulting in mild spinal canal stenosis with slight flattening of the ventral cord surface and moderate right worse than left neural foraminal stenosis slightly progressed since 2016   C5-C6: There is bilateral uncovertebral ridging with a shallow posterior disc osteophyte complex and mild facet arthropathy resulting in moderate right and mild left neural foraminal stenosis and no significant spinal canal stenosis not significantly changed.   C6-C7: There is mild bilateral uncovertebral ridging resulting in mild bilateral neural foraminal stenosis without significant spinal canal  stenosis   C7-T1: No significant spinal canal or neural foraminal stenosis   T1-T2: There is a small right paracentral protrusion without significant spinal canal or neural foraminal stenosis.   IMPRESSION: 1. Multilevel disc degeneration most advanced at C3-C4 through C6-C7 as above have overall slightly progressed since 2016. 2. Up to moderate right worse than left neural foraminal stenosis at C3-C4, and moderate right foraminal stenosis at C5-C6. 3. No greater than mild spinal canal stenosis at C4-C5 with slight flattening of the ventral cord surface.    PATIENT SURVEYS:  FOTO 50  PALPATION: Tender and tight left upper trap and levatore   Limited left shoulder mobility  CERVICAL ROM:   Active ROM A/PROM (deg) eval  Flexion 12  Extension 20  Right lateral flexion 32  Left lateral flexion 12*  Right rotation 50  Left rotation 34*   (Blank rows = not tested)  UPPER EXTREMITY ROM:  Active ROM Right eval Left eval  Shoulder flexion 150 114  Shoulder extension    Shoulder abduction    Shoulder adduction    Shoulder extension    Shoulder internal rotation    Shoulder external rotation    Elbow flexion    Elbow extension    Wrist flexion    Wrist extension    Wrist ulnar deviation    Wrist radial deviation    Wrist pronation    Wrist supination     (Blank rows = not tested)  UPPER EXTREMITY MMT:  MMT Right eval Left eval  Shoulder flexion 4+ 4-  Shoulder extension    Shoulder abduction 4+ 4  Shoulder adduction    Shoulder extension    Shoulder internal rotation    Shoulder external rotation    Middle trapezius 5 4  Lower trapezius    Elbow flexion  Elbow extension    Wrist flexion    Wrist extension    Wrist ulnar deviation    Wrist radial deviation    Wrist pronation    Wrist supination    Grip strength 62 52   (Blank rows = not tested)     TODAY'S TREATMENT:  05/27/22 UBE fwd x3' backwards x 3' level 1  Sitting: Thoracic  extensions over chair 2x 10 Cervical spine rotations x 10 + gentle end range overpressure 5" hold Upper trap stretch 5 x 20"  Standing: Cervical retractions 3 x 10 Scapular retractions 3 x 10- 2' hold 4# shrugs bil 3x10  Supine deep cervical flexion 5x 10"   STM to cervical spine and bilateral upper trap to decrease pain and improve soft tissue mobility; no other treatment performed during manual treatment  05/13/22 UBE backwards x 4' level 1 Pulley for flexion x 2'  Sitting: Cervical retractions 2 x 10 Scapular retractions 2 x 10 Thoracic extensions over chair x 10 AAROM left shoulder flexion with wand x 10 Cervical spine rotations x 10 Upper trap stretch 5 x 20"  Standing: RTB shoulder retractions 2 x 10 RTB shoulder extensions 2 x 10   STM to cervical spine and bilateral upper trap to decrease pain and improve soft tissue mobility; no other treatment performed during manual treatment     05/09/22 Sitting: UBE backward level 1 x 4:00 Pulley for flexion x 10 B ER with 2# x 10 with scapular retraction            Cervical excursions x 3            Thoracic excursions x 3            W-back x 10 with 2#             Backward shoulder rolls x 10              STM to cervical spine and bilateral upper trap to decrease pain and improve soft tissue mobility; no other treatment performed during manual treatment     PATIENT EDUCATION:  Education details: Patient educated on exam findings, POC, scope of PT, HEP. Person educated: Patient Education method: Explanation, Demonstration, and Handouts Education comprehension: verbalized understanding, returned demonstration, verbal cues required, and tactile cues required    HOME EXERCISE PROGRAM: Access Code: [email or web address] URL: [email or web address] Date: 05/06/2022 Prepared by: [facility] - Rehab  Exercises - Supine Chin Tuck  - 2 x daily - 7 x weekly - 1 sets - 10 reps - Supine Scapular Retraction  - 2 x daily - 7 x  weekly - 1 sets - 10 reps - Seated Upper Trapezius Stretch  - 2 x daily - 7 x weekly - 1 sets - 3 reps - 20" hold - Seated Cervical Retraction  - 1 x daily - 7 x weekly - 3 sets - 10 reps - Seated Scapular Retraction  - 1 x daily - 7 x weekly - 2 sets - 10 reps - Supine Shoulder Flexion with Dowel  - 1 x daily - 7 x weekly - 2 sets - 10 reps ASSESSMENT:  CLINICAL IMPRESSION: No pain with exercises, fatigues easily, reluctant to try weighted shrugs, shows reduced range with Lt UT; emphasized eccentric control and to relax UT at bottom of movement. Cues for technique with UT stretches.  Avoided overlap with similar exercises performed during OT session this morning. Less tenderness reported in UT region and cervical paraspinals with minimal restrictions noted.  OBJECTIVE IMPAIRMENTS decreased activity tolerance, decreased knowledge of condition, decreased mobility, decreased ROM, decreased strength, increased fascial restrictions, impaired perceived functional ability, increased muscle spasms, impaired flexibility, impaired UE functional use, and pain.   ACTIVITY LIMITATIONS carrying, lifting, sitting, standing, sleeping, reach over head, hygiene/grooming, and caring for others  PARTICIPATION LIMITATIONS: meal prep, cleaning, laundry, driving, shopping, and community activity  PERSONAL FACTORS Past/current experiences are also affecting patient's functional outcome.   REHAB POTENTIAL: Good  CLINICAL DECISION MAKING: Stable/uncomplicated  EVALUATION COMPLEXITY: Low   GOALS: Goals reviewed with patient? No  SHORT TERM GOALS: Target date: 05/21/2022    patient will be independent with initial HEP Baseline:  Goal status: IN PROGRESS  2.  Patient will improve left cervical rotation and side bending by 10 degrees to improve ability to scan environment for safety with driving.  Baseline: see above Goal status: IN PROGRESS   LONG TERM GOALS: Target date: 06/11/2022   Patient will be  independent with advanced HEP and self management strategies to improve quality of life and functional outcomes. Baseline:  Goal status: IN PROGRESS  2.  Patient will improve FOTO score to predicted value to demonstrate improved functional mobility  Baseline: 50 Goal status: IN PROGRESS  3.  Patient will report at least 50% improvement in overall symptoms and/or function to demonstrate improved functional mobility  Baseline:  Goal status: IN PROGRESS  4.  Patient will improve left shoulder flexion in sitting to 130 degrees to improve ability to reach overhead in kitchen cabinets.  Baseline: 115 Goal status: IN PROGRESS  5.  Patient will improve cervical mobility by 40 degrees total to improve ability to scan environment for safety when driving. Baseline: see above Goal status: IN PROGRESS     PLAN: PT FREQUENCY: 1x/week  PT DURATION: 6 weeks  PLANNED INTERVENTIONS: Therapeutic exercises, Therapeutic activity, Neuromuscular re-education, Balance training, Gait training, Patient/Family education, Joint manipulation, Joint mobilization, Stair training, Orthotic/Fit training, DME instructions, Aquatic Therapy, Dry Needling, Electrical stimulation, Spinal manipulation, Spinal mobilization, Cryotherapy, Moist heat, Compression bandaging, scar mobilization, Splintting, Taping, Traction, Ultrasound, Ionotophoresis '4mg'$ /ml Dexamethasone, and Manual therapy  PLAN FOR NEXT SESSION:  Reassess next visit.  12:01 PM, 05/27/22 Candie Mile, PT, DPT Physical Therapist [facility]

## 2022-06-03 ENCOUNTER — Ambulatory Visit (HOSPITAL_COMMUNITY): Payer: PPO

## 2022-06-03 ENCOUNTER — Encounter (HOSPITAL_COMMUNITY): Payer: Self-pay

## 2022-06-03 ENCOUNTER — Ambulatory Visit (HOSPITAL_COMMUNITY): Payer: PPO | Admitting: Occupational Therapy

## 2022-06-03 DIAGNOSIS — M542 Cervicalgia: Secondary | ICD-10-CM

## 2022-06-03 DIAGNOSIS — R29898 Other symptoms and signs involving the musculoskeletal system: Secondary | ICD-10-CM

## 2022-06-03 DIAGNOSIS — G8929 Other chronic pain: Secondary | ICD-10-CM

## 2022-06-03 NOTE — Therapy (Unsigned)
OUTPATIENT OCCUPATIONAL THERAPY ORTHO TREATMENT  Patient Name: Desiree [name] MRN: [id number] DOB:Jan 12, 1946, 76 y.o., female Today's Date: 06/04/2022  PCP: Tula Nakayama, MD REFERRING PROVIDER: Tania Ade, MD   End of Session:  06/03/22 1035  OT Visits / Re-Eval  Visit Number 5  Number of Visits 7  Date for OT Re-Evaluation 06/11/22  Authorization  Authorization Type 1) [employer]    2) Medicaid of [place]  Authorization Time Period no auth, no visit limit  OT Time Calculation  OT Start Time [phone]  OT Stop Time 1036  OT Time Calculation (min) 38 min  End of Session  Activity Tolerance Patient tolerated treatment well  Behavior During Therapy WFL for tasks assessed/performed     Past Medical History:  Diagnosis Date   Allergic rhinitis    Arthritis HIPS/ WRISTS   Cancer ([place])    DDD (degenerative disc disease), cervical    GERD (gastroesophageal reflux disease) OCCASIONAL   Heart murmur    turbulent flow through LVOT, no obstruction 08/26/18 echo   History of Bell's palsy 2011  RIGHT SIDE -- RESOLVED   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--  S/P  RIGHT MASTECTMOY AND RADIATION--  NO RECURRANCE   History of CVA (cerebrovascular accident) PER SCAN IN 2011   Hyperlipidemia    Hypertension    Impaired fasting glucose PER PCP  DR Moshe Cipro   WATCH DIET   Mixed urge and stress incontinence    Nocturia    Sinus drainage    Thoracic ascending aortic aneurysm ([place]) LAST CHEST CT 09-02-2010--  FOLLOWED BY  CARDIOLOGIST--  DR [id number]   Vaginal wall prolapse    Past Surgical History:  Procedure Laterality Date   ABDOMINAL HYSTERECTOMY     BILATERAL CARPAL TUNNEL RELEASE  1980'S   CARDIAC CATHETERIZATION  02-01-2009  DR [place]   NORMAL CORONARY ARTERIES/ NORMAL LV SIZE AND FUNCTION/ AORTIC ROOT SIZE AT THE UPPER LIMIT OF NORMAL    CARPAL TUNNEL RELEASE     CERVICAL [place] SURGERY  12-12-2005  DR Vertell Limber   LEFT  C6 - C7 HERINATED / DDD/  SPONDYLOSIS   COLONOSCOPY N/A 02/10/2014   Surgeon: Danie Binder, MD; Normal TI, mild diverticulosis in sigmoid and transverse colon, small external hemorrhoids, no obvious source for anemia.  Repeat colonoscopy in 10-15 years if benefits outweigh risk.   CYSTOCELE REPAIR  08/02/2012   Procedure: ANTERIOR REPAIR (CYSTOCELE);  Surgeon: Ailene Rud, MD;  Location: [facility];  Service: Urology;  Laterality: N/A;  [employer] Uphold Anterior Pelvic Floor Sacrospinus Repair. Anterior wall of the vagina.   ESOPHAGOGASTRODUODENOSCOPY N/A 02/10/2014   Surgeon: Danie Binder, MD;   Mild gastritis/duodenitis s/p biopsied, no obvious source for anemia.  Pathology with mild chronic gastritis with intestinal metaplasia, negative for H. pylori, duodenal biopsy benign.   GIVENS CAPSULE STUDY N/A 03/13/2014   Surgeon: Danie Binder, MD;  Normal exam.  Suspected anemia most likely due to chronic disease.   JOINT REPLACEMENT N/A    [employer] 06/16/2020   NECK SURGERY  2007   RIGHT PARTIAL MASTECTOMY  11-11-2004  DR PETER YOUNG   DUCTAL CARCINOMA IN SITU RIGHT BREAST   TOTAL ABDOMINAL HYSTERECTOMY W/ BILATERAL SALPINGOOPHORECTOMY  1990   TOTAL KNEE ARTHROPLASTY Right 12/21/2019   Procedure: RIGHT  TOTAL KNEE ARTHROPLASTY;  Surgeon: [name], MD;  Location: [place];  Service: Orthopedics;  Laterality: Right;   TRANSTHORACIC ECHOCARDIOGRAM  03-30-2008  DR MARGARET SIMPSON  LV SIZE AND FUNCTION NORMAL/ MODERATE AORTIC ARCH DILATATION/ MILD MR   Patient Active Problem List   Diagnosis Date Noted   Stage 3b chronic kidney disease (CKD) ([place]) 03/20/2022   AKI (acute kidney injury) ([place]) 03/19/2022   Iron deficiency anemia 01/30/2022   Infected tooth 09/11/2021   Vertigo 07/10/2021   Unilateral primary osteoarthritis, left knee 09/13/2020   S/P TKR (total knee replacement), right 12/21/2019   Fatigue 10/18/2019   Left shoulder pain 04/11/2019   Ganglion cyst of volar  aspect of left wrist 10/05/2018   Allergic cough 05/14/2016   Neck pain on left side 01/01/2016   Left ventricular outflow tract obstruction 05/17/3006   Metabolic syndrome X 62/26/3335   Osteopenia 12/12/2013   Anemia 11/29/2013   Morbid obesity ([place]) 10/17/2012   Aneurysm of thoracic aorta ([place]) 10/12/2012   Allergic rhinitis 03/02/2012   Disorder of bladder 09/24/2010   Malignant neoplasm of female breast ([place]) 02/04/2008   Hyperlipidemia 02/04/2008   Essential hypertension 02/04/2008    ONSET DATE: Ongoing pain for 3+ years  REFERRING DIAG: shoulder glenohumeral osteoarthritis   THERAPY DIAG:  Chronic left shoulder pain  Other symptoms and signs involving the musculoskeletal system  Rationale for Evaluation and Treatment Rehabilitation  SUBJECTIVE:   SUBJECTIVE STATEMENT: S: "I am so sore."   PRECAUTIONS: None  WEIGHT BEARING RESTRICTIONS No  PAIN:  Are you having pain? No Pain with movement  PATIENT GOALS "Less pain."  OBJECTIVE:   FUNCTIONAL OUTCOME MEASURES: FOTO: 48.43  UPPER EXTREMITY ROM     Active ROM Left eval  Shoulder flexion 102  Shoulder abduction 100  Shoulder external rotation 22  (Blank rows = not tested)  Passive ROM Left eval  Shoulder flexion 98  Shoulder abduction 103  Shoulder external rotation 30  (Blank rows = not tested)   UPPER EXTREMITY MMT:     MMT Left eval  Shoulder flexion 3+/5  Shoulder abduction 3/5  Shoulder internal rotation 4-/5  Shoulder external rotation 3/5  (Blank rows = not tested)  HAND FUNCTION: Grip strength: Right: 62 lbs; Left: 52 lbs   OBSERVATIONS: Fascial restrictions throughout upper quadrant-trapezius, axillary region, deltoids    TODAY'S TREATMENT:    06/03/22 -Manual Therapy: myofascial release and trigger point on entire LUE to address pain, fascial restrictions and improve ROM.  -AA/ROM: seated, flexion, abduction, horizontal abduction, protraction, er/IR, 1x10 -A/ROM: seated,  flexion, abduction, horizontal abduction, er/IR, 1x10 -Strengthening: 3lb dumbbell, side lying, flexion, extension, abduction, horizontal abduction, er/IR, 1x10    05/27/22 -Manual Therapy: myofascial release and trigger point on entire LUE to address pain, fascial restrictions and improve ROM.  -AA/ROM: 3lb dowel, supine, flexion, abduction, horizontal abduction, protraction, er/IR, 1x10 -A/ROM: supine, flexion, abduction, horizontal abduction, protraction, er/IR, 1x10 -Strengthening: 2lb dumbbell, side lying, flexion, extension, abduction, horizontal abduction, er/IR, 1x10 -Scapula Strengthening: Red theraband, extension, rows, retraction, 1x10  05/22/22 -Manual Therapy: myofascial release and trigger point on entire LUE to address pain, fascial restrictions and improve ROM.  -P/ROM: Flexion, abduction, Horizontal abduction/adduction, er/IR, 1x5 with prolonged stretch at end ranges -Muscle energy technique- supine, flexion, er, 10" holds then stretching to end ROM, 2X  -A/ROM: standing- flexion, abduction, protraction, horizontal abduction, er/IR, 1x10 -Shoulder stretches: flexion, IR behind back with vertical towel, doorway stretch, er stretch at wall, posterior capsule stretch, 2x10" each -Wall wash: 1' flexion -Pulleys: 1' flexion, 1' abduction -UBE: level 1, 2' forward 2' reverse, pace: 3.5   PATIENT EDUCATION: Education details: Manufacturing engineer Person educated: Patient Education  method: Explanation, Demonstration, and Handouts Education comprehension: verbalized understanding and returned demonstration   HOME EXERCISE PROGRAM: 05/14/22: A/ROM and wall slides 05/22/22: Shoulder stretches 05/27/22: Scapula Strengthening 06/03/22: Shoulder Strengthening  GOALS: Goals reviewed with patient? Yes  SHORT TERM GOALS: Target date:  06/11/22     Pt will be provided with and educated on HEP to improve mobility in LUE required for ADL completion.   Goal status: IN PROGRESS  2.   Pt will decrease pain in LUE to 3/10 or less in order to sleep through the night without waking due to pain.   Goal status: IN PROGRESS  3.  Pt will decrease LUE fascial restrictions to minimal amounts or less to improve mobility required for overhead reaching tasks during meal preparation.   Goal status: IN PROGRESS  4.  Pt will increase A/ROM of LUE to [facility] to improve ability to reach overhead and behind back during dressing and bathing tasks.   Goal status: IN PROGRESS  5.  Pt will increase strength in LUE to 4/5 to improve ability to perform lifting tasks required for carrying groceries and putting them over in upper cabinets.    Goal status: IN PROGRESS    ASSESSMENT:  CLINICAL IMPRESSION: A: Pt with continued pain in her shoulder. She states that her exercises make her quickly fatigue and she stays sore for days after. Pt's fascial restrictions continue to be moderate along the trapezius, axillary region, and biceps. Therapist added shoulder strengthening to pt's HEP, with education to keep weights low, in attempt to not further aggravate the pain. Therapist providing verbal cuing throughout for positioning and technique. She continues to benefit from skilled OT to maximize strength and ROM in order to improve ADL and IADL's.   Follow up with MD on 07/02/22   PLAN: OT FREQUENCY: 1x/week  OT DURATION: 6 weeks  PLANNED INTERVENTIONS: self care/ADL training, therapeutic exercise, therapeutic activity, manual therapy, passive range of motion, electrical stimulation, ultrasound, moist heat, cryotherapy, patient/family education, cognitive remediation/compensation, energy conservation, coping strategies training, and DME and/or AE instructions  CONSULTED AND AGREED WITH PLAN OF CARE: Patient  PLAN FOR NEXT SESSION: P/ROM and muscle energy technique, A/ROM, proximal shoulder strengthening, therapy ball wall stretch   Paulita Fujita, OTR/L [phone]  06/04/2022, 9:32 PM

## 2022-06-03 NOTE — Therapy (Signed)
OUTPATIENT PHYSICAL THERAPY CERVICAL TREATMENT   Patient Name: Desiree [name] MRN: [id number] DOB:[birth year], 76 y.o., female Today's Date: 06/03/2022   PT End of Session - 06/03/22 1210     Visit Number 6    Number of Visits 6    Date for PT Re-Evaluation 06/11/22    Authorization Type [employer]/ Medicaid of [place]; no VL; no auth    Progress Note Due on Visit 10    PT Start Time 1117    PT Stop Time 1205    PT Time Calculation (min) 48 min    Activity Tolerance Patient tolerated treatment well;Patient limited by pain;No increased pain    Behavior During Therapy WFL for tasks assessed/performed                 Past Medical History:  Diagnosis Date   Allergic rhinitis    Arthritis HIPS/ WRISTS   Cancer ([place])    DDD (degenerative disc disease), cervical    GERD (gastroesophageal reflux disease) OCCASIONAL   Heart murmur    turbulent flow through LVOT, no obstruction 08/26/18 echo   History of Bell's palsy 2011  RIGHT SIDE -- RESOLVED   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--  S/P  RIGHT MASTECTMOY AND RADIATION--  NO RECURRANCE   History of CVA (cerebrovascular accident) PER SCAN IN 2011   Hyperlipidemia    Hypertension    Impaired fasting glucose PER PCP  DR Moshe Cipro   WATCH DIET   Mixed urge and stress incontinence    Nocturia    Sinus drainage    Thoracic ascending aortic aneurysm ([place]) LAST CHEST CT 09-02-2010--  FOLLOWED BY  CARDIOLOGIST--  DR CHYIFOYD   Vaginal wall prolapse    Past Surgical History:  Procedure Laterality Date   ABDOMINAL HYSTERECTOMY     BILATERAL CARPAL TUNNEL RELEASE  1980'S   CARDIAC CATHETERIZATION  02-01-2009  DR [place]   NORMAL CORONARY ARTERIES/ NORMAL LV SIZE AND FUNCTION/ AORTIC ROOT SIZE AT THE UPPER LIMIT OF NORMAL    CARPAL TUNNEL RELEASE     CERVICAL [place] SURGERY  12-12-2005  DR Vertell Limber   LEFT  C6 - C7 HERINATED / DDD/ SPONDYLOSIS   COLONOSCOPY N/A 02/10/2014   Surgeon: Danie Binder, MD; Normal  TI, mild diverticulosis in sigmoid and transverse colon, small external hemorrhoids, no obvious source for anemia.  Repeat colonoscopy in 10-15 years if benefits outweigh risk.   CYSTOCELE REPAIR  08/02/2012   Procedure: ANTERIOR REPAIR (CYSTOCELE);  Surgeon: Ailene Rud, MD;  Location: [facility];  Service: Urology;  Laterality: N/A;  [employer] Uphold Anterior Pelvic Floor Sacrospinus Repair. Anterior wall of the vagina.   ESOPHAGOGASTRODUODENOSCOPY N/A 02/10/2014   Surgeon: Danie Binder, MD;   Mild gastritis/duodenitis s/p biopsied, no obvious source for anemia.  Pathology with mild chronic gastritis with intestinal metaplasia, negative for H. pylori, duodenal biopsy benign.   GIVENS CAPSULE STUDY N/A 03/13/2014   Surgeon: Danie Binder, MD;  Normal exam.  Suspected anemia most likely due to chronic disease.   JOINT REPLACEMENT N/A    [employer] 06/16/2020   NECK SURGERY  2007   RIGHT PARTIAL MASTECTOMY  11-11-2004  DR PETER YOUNG   DUCTAL CARCINOMA IN SITU RIGHT BREAST   TOTAL ABDOMINAL HYSTERECTOMY W/ BILATERAL SALPINGOOPHORECTOMY  1990   TOTAL KNEE ARTHROPLASTY Right 12/21/2019   Procedure: RIGHT  TOTAL KNEE ARTHROPLASTY;  Surgeon: [name], MD;  Location: [facility];  Service: Orthopedics;  Laterality: Right;   TRANSTHORACIC ECHOCARDIOGRAM  03-30-2008  DR MARGARET SIMPSON   LV SIZE AND FUNCTION NORMAL/ MODERATE AORTIC ARCH DILATATION/ MILD MR   Patient Active Problem List   Diagnosis Date Noted   Stage 3b chronic kidney disease (CKD) ([place] [facility]) 03/20/2022   AKI (acute kidney injury) ([place]) 03/19/2022   Iron deficiency anemia 01/30/2022   Infected tooth 09/11/2021   Vertigo 07/10/2021   Unilateral primary osteoarthritis, left knee 09/13/2020   S/P TKR (total knee replacement), right 12/21/2019   Fatigue 10/18/2019   Left shoulder pain 04/11/2019   Ganglion cyst of volar aspect of left wrist 10/05/2018   Allergic cough 05/14/2016   Neck pain on left side  01/01/2016   Left ventricular outflow tract obstruction 93/23/5573   Metabolic syndrome X 22/09/5425   Osteopenia 12/12/2013   Anemia 11/29/2013   Morbid obesity ([place]) 10/17/2012   Aneurysm of thoracic aorta ([place]) 10/12/2012   Allergic rhinitis 03/02/2012   Disorder of bladder 09/24/2010   Malignant neoplasm of female breast ([place]) 02/04/2008   Hyperlipidemia 02/04/2008   Essential hypertension 02/04/2008    PCP: [name], MD  REFERRING PROVIDER: PT eval/tx for M54.2 cervicalgia per [name], DO needs dry needling; [place] for shoulder next apt 07/02/22  REFERRING DIAG: PT eval/tx for M54.2 cervicalgia per [name], DO needs dry needling   THERAPY DIAG:  Chronic left shoulder pain  Other symptoms and signs involving the musculoskeletal system  Neck pain  Rationale for Evaluation and Treatment Rehabilitation  ONSET DATE: several months  SUBJECTIVE:                                                                                                                                                                                                         SUBJECTIVE STATEMENT: Pt with OT session prior PT session today.  Stated she is limited by pain Lt side of neck and shoulder, pain scale 8/10.  Feels arthritis may play a part due to colder weather today.  PERTINENT HISTORY:  Neck surgery 2007; discectomy per Dr. Vertell Limber CPT bilaterally  PAIN:  Are you having pain? Yes: NPRS scale: 8/10 Pain location: left side of neck Pain description: tight and achey Aggravating factors: unknown Relieving factors: Tylenol OCCUPATION: retired  PLOF: Independent  PATIENT GOALS less pain in my neck  OBJECTIVE:   DIAGNOSTIC FINDINGS:  CLINICAL DATA:  Neck pain with cervical spondylosis and disc disease with worsening pain for 6 months. Left arm pain with limited range of motion.   EXAM: MRI CERVICAL SPINE WITHOUT AND WITH CONTRAST  TECHNIQUE: Multiplanar and  multiecho pulse sequences of the cervical spine, to include the craniocervical junction and cervicothoracic junction, were obtained without and with intravenous contrast.   CONTRAST:  26m GADAVIST GADOBUTROL 1 MMOL/ML IV SOLN   COMPARISON:  Cervical spine MRI 10/02/2014   FINDINGS: Alignment: There is slight reversal of the normal cervical curvature centered at C4-C5, similar to the prior study. There is no antero or retrolisthesis.   Vertebrae: Vertebral body heights are preserved. Background marrow signal is normal. There is mild degenerative endplate marrow signal abnormality at C3-C4. There is no suspicious marrow signal abnormality, marrow edema or abnormal marrow enhancement.   Cord: Normal in signal and morphology.   Posterior Fossa, vertebral arteries, paraspinal tissues: The imaged posterior fossa is unremarkable. The vertebral artery flow voids are normal. The paraspinal soft tissues are unremarkable.   Disc levels:   There is disc desiccation and narrowing at C3-C4 through C6-C7, overall slightly progressed in the interim.   C2-C3: No significant spinal canal or neural foraminal stenosis.   C3-C4: There is a shallow posterior disc osteophyte complex with bilateral uncovertebral ridging and facet arthropathy resulting in moderate right worse than left neural foraminal stenosis without significant spinal canal stenosis, progressed since 2016.   C4-C5: There is a shallow posterior disc osteophyte complex with bilateral uncovertebral ridging and mild facet arthropathy resulting in mild spinal canal stenosis with slight flattening of the ventral cord surface and moderate right worse than left neural foraminal stenosis slightly progressed since 2016   C5-C6: There is bilateral uncovertebral ridging with a shallow posterior disc osteophyte complex and mild facet arthropathy resulting in moderate right and mild left neural foraminal stenosis and no significant spinal  canal stenosis not significantly changed.   C6-C7: There is mild bilateral uncovertebral ridging resulting in mild bilateral neural foraminal stenosis without significant spinal canal stenosis   C7-T1: No significant spinal canal or neural foraminal stenosis   T1-T2: There is a small right paracentral protrusion without significant spinal canal or neural foraminal stenosis.   IMPRESSION: 1. Multilevel disc degeneration most advanced at C3-C4 through C6-C7 as above have overall slightly progressed since 2016. 2. Up to moderate right worse than left neural foraminal stenosis at C3-C4, and moderate right foraminal stenosis at C5-C6. 3. No greater than mild spinal canal stenosis at C4-C5 with slight flattening of the ventral cord surface.    PATIENT SURVEYS:  FOTO 50  PALPATION: Tender and tight left upper trap and levatore   Limited left shoulder mobility  CERVICAL ROM:   Active ROM A/PROM (deg) eval AROM 06/03/22  Flexion 12 15   Extension 20 28  Right lateral flexion 32 40   Left lateral flexion 12* 35  Right rotation 50 55  Left rotation 34* 43   (Blank rows = not tested)  UPPER EXTREMITY ROM:  Active ROM Right eval Left eval Right 06/03/22 Left Complete in seated position 06/03/22  Shoulder flexion 150 114 160  120  Shoulder extension      Shoulder abduction    135  Shoulder adduction      Shoulder extension    43  Shoulder internal rotation      Shoulder external rotation      Elbow flexion      Elbow extension      Wrist flexion      Wrist extension      Wrist ulnar deviation      Wrist radial deviation      Wrist pronation  Wrist supination       (Blank rows = not tested)  UPPER EXTREMITY MMT:  MMT Right eval Left eval  Left 06/03/22  Shoulder flexion 4+ 4- 4+ 4/5 limited by pain  Shoulder extension    4/5 pain  Shoulder abduction 4+ 4  4-/5 limited by pain  Shoulder adduction      Shoulder extension      Shoulder internal  rotation      Shoulder external rotation      Middle trapezius 5 4  4/5  Lower trapezius      Elbow flexion      Elbow extension      Wrist flexion      Wrist extension      Wrist ulnar deviation      Wrist radial deviation      Wrist pronation      Wrist supination      Grip strength 62 52     (Blank rows = not tested)     TODAY'S TREATMENT:  06/03/22 UBE fwd x3' backwards x 3' level 1 FOTO ROM MMT Reviewed goals and HEP to continue at home.  STM in supine to UT, scalenes, levator scapula  05/27/22 UBE fwd x3' backwards x 3' level 1  Sitting: Thoracic extensions over chair 2x 10 Cervical spine rotations x 10 + gentle end range overpressure 5" hold Upper trap stretch 5 x 20"  Standing: Cervical retractions 3 x 10 Scapular retractions 3 x 10- 2' hold 4# shrugs bil 3x10  Supine deep cervical flexion 5x 10"   STM to cervical spine and bilateral upper trap to decrease pain and improve soft tissue mobility; no other treatment performed during manual treatment  05/13/22 UBE backwards x 4' level 1 Pulley for flexion x 2'  Sitting: Cervical retractions 2 x 10 Scapular retractions 2 x 10 Thoracic extensions over chair x 10 AAROM left shoulder flexion with wand x 10 Cervical spine rotations x 10 Upper trap stretch 5 x 20"  Standing: RTB shoulder retractions 2 x 10 RTB shoulder extensions 2 x 10   STM to cervical spine and bilateral upper trap to decrease pain and improve soft tissue mobility; no other treatment performed during manual treatment     05/09/22 Sitting: UBE backward level 1 x 4:00 Pulley for flexion x 10 B ER with 2# x 10 with scapular retraction            Cervical excursions x 3            Thoracic excursions x 3            W-back x 10 with 2#             Backward shoulder rolls x 10              STM to cervical spine and bilateral upper trap to decrease pain and improve soft tissue mobility; no other treatment performed during manual  treatment     PATIENT EDUCATION:  Education details: Patient educated on exam findings, POC, scope of PT, HEP. Person educated: Patient Education method: Explanation, Demonstration, and Handouts Education comprehension: verbalized understanding, returned demonstration, verbal cues required, and tactile cues required    HOME EXERCISE PROGRAM: Access Code: [email or web address] URL: [email or web address] Date: 05/06/2022 Prepared by: [facility] - Rehab  Exercises - Supine Chin Tuck  - 2 x daily - 7 x weekly - 1 sets - 10 reps - Supine Scapular Retraction  - 2 x daily - 7 x  weekly - 1 sets - 10 reps - Seated Upper Trapezius Stretch  - 2 x daily - 7 x weekly - 1 sets - 3 reps - 20" hold - Seated Cervical Retraction  - 1 x daily - 7 x weekly - 3 sets - 10 reps - Seated Scapular Retraction  - 1 x daily - 7 x weekly - 2 sets - 10 reps - Supine Shoulder Flexion with Dowel  - 1 x daily - 7 x weekly - 2 sets - 10 reps ASSESSMENT:  CLINICAL IMPRESSION: Reviewed goals with the following findings:  Pt continues to be limited by pain, due to pain Lt side of neck and shoulder reports she feels improvements by 5-6%.  FOTO complete this session with improves of self perceived functional ability by 9% since initial eval.  Objective measurements show improvements with cervical mobility and some improvements with strength.  Cueing required to address compensation with UT during shoulder flexion testing.  Noted musculature restrictions that may contribute to limited range and pain.  End of session with soft tissue mobilization to address restrictions with reports of pain reduced following.  Discussed continuing therapy vs DC to HEP.  Pt wishes to DC from PT but continue with OT as they are focusing on shoulder strengthening.  Reviewed current HEP to continue at home.  Also encouraged pt to see massage therapists to address musculature restrictions.     OBJECTIVE IMPAIRMENTS decreased activity tolerance,  decreased knowledge of condition, decreased mobility, decreased ROM, decreased strength, increased fascial restrictions, impaired perceived functional ability, increased muscle spasms, impaired flexibility, impaired UE functional use, and pain.   ACTIVITY LIMITATIONS carrying, lifting, sitting, standing, sleeping, reach over head, hygiene/grooming, and caring for others  PARTICIPATION LIMITATIONS: meal prep, cleaning, laundry, driving, shopping, and community activity  PERSONAL FACTORS Past/current experiences are also affecting patient's functional outcome.   REHAB POTENTIAL: Good  CLINICAL DECISION MAKING: Stable/uncomplicated  EVALUATION COMPLEXITY: Low   GOALS: Goals reviewed with patient? No  SHORT TERM GOALS: Target date: 05/21/2022    patient will be independent with initial HEP Baseline: 06/03/22:  Reports compliance with HEP daily Goal status: MET  2.  Patient will improve left cervical rotation and side bending by 10 degrees to improve ability to scan environment for safety with driving.  Baseline: see above Goal status: MET   LONG TERM GOALS: Target date: 06/11/2022   Patient will be independent with advanced HEP and self management strategies to improve quality of life and functional outcomes. Baseline: 06/03/22:  Reports compliance with HEP daily Goal status: MET  2.  Patient will improve FOTO score to predicted value to demonstrate improved functional mobility  Baseline: Eval: 50, 06/03/22: 59.3% Goal status: IN PROGRESS  3.  Patient will report at least 50% improvement in overall symptoms and/or function to demonstrate improved functional mobility  Baseline: 06/03/22: continues to be limited by pain, unsure if she has made any improvements maybe 5-6%. Goal status: IN PROGRESS  4.  Patient will improve left shoulder flexion in sitting to 130 degrees to improve ability to reach overhead in kitchen cabinets.  Baseline: Eval: 115, 06/03/22 Goal status: IN  PROGRESS  5.  Patient will improve cervical mobility by 40 degrees total to improve ability to scan environment for safety when driving. Baseline: see above Goal status: MET     PLAN: PT FREQUENCY: 1x/week  PT DURATION: 6 weeks  PLANNED INTERVENTIONS: Therapeutic exercises, Therapeutic activity, Neuromuscular re-education, Balance training, Gait training, Patient/Family education, Joint manipulation, Joint mobilization,  Stair training, Orthotic/Fit training, DME instructions, Aquatic Therapy, Dry Needling, Electrical stimulation, Spinal manipulation, Spinal mobilization, Cryotherapy, Moist heat, Compression bandaging, scar mobilization, Splintting, Taping, Traction, Ultrasound, Ionotophoresis 84m/ml Dexamethasone, and Manual therapy  PLAN FOR NEXT SESSION:  DC to HEP.  CIhor Austin LPTA/CLT; CBIS [phone] 1:52 PM, 06/03/22

## 2022-06-04 ENCOUNTER — Encounter (HOSPITAL_COMMUNITY): Payer: Self-pay | Admitting: Occupational Therapy

## 2022-06-10 ENCOUNTER — Encounter (HOSPITAL_COMMUNITY): Payer: PPO | Admitting: Physical Therapy

## 2022-06-10 ENCOUNTER — Encounter (HOSPITAL_COMMUNITY): Payer: PPO | Admitting: Occupational Therapy

## 2022-06-13 ENCOUNTER — Ambulatory Visit (INDEPENDENT_AMBULATORY_CARE_PROVIDER_SITE_OTHER): Payer: PPO | Admitting: Family Medicine

## 2022-06-13 ENCOUNTER — Encounter: Payer: Self-pay | Admitting: Family Medicine

## 2022-06-13 ENCOUNTER — Other Ambulatory Visit: Payer: Self-pay

## 2022-06-13 VITALS — BP 129/82 | HR 55 | Ht 61.0 in | Wt 190.0 lb

## 2022-06-13 DIAGNOSIS — Z23 Encounter for immunization: Secondary | ICD-10-CM

## 2022-06-13 DIAGNOSIS — N1832 Chronic kidney disease, stage 3b: Secondary | ICD-10-CM

## 2022-06-13 DIAGNOSIS — E785 Hyperlipidemia, unspecified: Secondary | ICD-10-CM

## 2022-06-13 DIAGNOSIS — E559 Vitamin D deficiency, unspecified: Secondary | ICD-10-CM | POA: Diagnosis not present

## 2022-06-13 DIAGNOSIS — Z0000 Encounter for general adult medical examination without abnormal findings: Secondary | ICD-10-CM

## 2022-06-13 DIAGNOSIS — I10 Essential (primary) hypertension: Secondary | ICD-10-CM | POA: Diagnosis not present

## 2022-06-13 DIAGNOSIS — D649 Anemia, unspecified: Secondary | ICD-10-CM | POA: Diagnosis not present

## 2022-06-13 DIAGNOSIS — R21 Rash and other nonspecific skin eruption: Secondary | ICD-10-CM | POA: Insufficient documentation

## 2022-06-13 MED ORDER — BETAMETHASONE DIPROPIONATE 0.05 % EX CREA: TOPICAL_CREAM | CUTANEOUS | 1 refills | Status: DC

## 2022-06-13 MED ORDER — TERBINAFINE & MICONAZOLE 250 & 2 MG & % EX KIT: PACK | CUTANEOUS | 1 refills | Status: DC

## 2022-06-13 MED ORDER — TERBINAFINE HCL 1 % EX CREA: TOPICAL_CREAM | CUTANEOUS | 0 refills | Status: DC

## 2022-06-13 MED ORDER — MONTELUKAST SODIUM 10 MG PO TABS: 10.0000 mg | ORAL_TABLET | Freq: Every day | ORAL | 0 refills | Status: DC

## 2022-06-13 NOTE — Assessment & Plan Note (Signed)
Topical terbinafine and betamethasone prescribed

## 2022-06-13 NOTE — Patient Instructions (Addendum)
F/u in 4.5 months, call if you need me sooner  Flu vaccine today  Nurse please get from [facility] shingrix vaccines, states she has had 2, also TdAP  I recommend covid vaccine  Terbinafine cream and betamethasone cream are prescribed for rash on scalp, call for Dermatology appt if contines to bother you as we discussed  Labs today, cBC, lipid, cm and eGFr, tSH and vit D  It is important that you exercise regularly at least 30 minutes 5 times a week. If you develop chest pain, have severe difficulty breathing, or feel very tired, stop exercising immediately and seek medical attention   Thanks for choosing [facility], we consider it a privelige to serve you.

## 2022-06-13 NOTE — Progress Notes (Signed)
    Desiree [name]     MRN: [id number]      DOB: 04/28/1946  HPI: Patient is in for annual physical exam. C/o itchy rash on forehead, persistent, h/o childhood eczema  Immunization is reviewed ,  PE: BP 129/82 (BP Location: Right Arm, Patient Position: Sitting, Cuff Size: Normal)   Pulse (!) 55   Ht '5\' 1"'$  (1.549 m)   Wt 190 lb (86.2 kg)   SpO2 96%   BMI 35.90 kg/m   Pleasant  female, alert and oriented x 3, in no cardio-pulmonary distress. Afebrile. HEENT No facial trauma or asymetry. Sinuses non tender.  Extra occullar muscles intact.. External ears normal, . Neck: supple, no adenopathy,JVD or thyromegaly.No bruits.  Chest: Clear to ascultation bilaterally.No crackles or wheezes. Non tender to palpation  Cardiovascular system; Heart sounds normal,  S1 and  S2 ,no S3.  No murmur, or thrill. Apical beat not displaced Peripheral pulses normal.  Abdomen: Soft, non tender, no organomegaly or masses. No bruits. Bowel sounds normal. No guarding, tenderness or rebound.      Musculoskeletal exam: Full ROM of spine, hips ,  and knees.Decreased ROM left shoulder  deformity ,swelling and  crepitus noted in knees No muscle wasting or atrophy.   Neurologic: Cranial nerves 2 to 12 intact. Power, tone ,sensation and reflexes normal throughout. No disturbance in gait. No tremor.  Skin: Intact, no ulceration,  rash noted.on scalp on hairline, no erythema or drainage Pigmentation normal throughout  Psych; Normal mood and affect. Judgement and concentration normal   Assessment & Plan:  Encounter for annual physical exam Annual exam as documented. Counseling done  re healthy lifestyle involving commitment to 150 minutes exercise per week, heart healthy diet, and attaining healthy weight.The importance of adequate sleep also discussed. Immunization and cancer screening needs are specifically addressed at this visit.   Rash and nonspecific skin eruption Topical  terbinafine and betamethasone prescribed

## 2022-06-13 NOTE — Assessment & Plan Note (Signed)
Annual exam as documented. Counseling done  re healthy lifestyle involving commitment to 150 minutes exercise per week, heart healthy diet, and attaining healthy weight.The importance of adequate sleep also discussed.  Immunization and cancer screening needs are specifically addressed at this visit.  

## 2022-06-14 LAB — TSH: TSH: 3.56 u[IU]/mL (ref 0.450–4.500)

## 2022-06-14 LAB — CMP14+EGFR
ALT: 12 IU/L (ref 0–32)
AST: 22 IU/L (ref 0–40)
Albumin/Globulin Ratio: 1.5 (ref 1.2–2.2)
Albumin: 4.1 g/dL (ref 3.8–4.8)
Alkaline Phosphatase: 100 IU/L (ref 44–121)
BUN/Creatinine Ratio: 18 (ref 12–28)
BUN: 33 mg/dL — ABNORMAL HIGH (ref 8–27)
Bilirubin Total: 0.4 mg/dL (ref 0.0–1.2)
CO2: 27 mmol/L (ref 20–29)
Calcium: 9.6 mg/dL (ref 8.7–10.3)
Chloride: 99 mmol/L (ref 96–106)
Creatinine, Ser: 1.81 mg/dL — ABNORMAL HIGH (ref 0.57–1.00)
Globulin, Total: 2.8 g/dL (ref 1.5–4.5)
Glucose: 97 mg/dL (ref 70–99)
Potassium: 4.2 mmol/L (ref 3.5–5.2)
Sodium: 142 mmol/L (ref 134–144)
Total Protein: 6.9 g/dL (ref 6.0–8.5)
eGFR: 29 mL/min/{1.73_m2} — ABNORMAL LOW (ref >59)

## 2022-06-14 LAB — CBC
Hematocrit: 31.7 % — ABNORMAL LOW (ref 34.0–46.6)
Hemoglobin: 10.4 g/dL — ABNORMAL LOW (ref 11.1–15.9)
MCH: 29 pg (ref 26.6–33.0)
MCHC: 32.8 g/dL (ref 31.5–35.7)
MCV: 88 fL (ref 79–97)
Platelets: 296 10*3/uL (ref 150–450)
RBC: 3.59 x10E6/uL — ABNORMAL LOW (ref 3.77–5.28)
RDW: 13.3 % (ref 11.7–15.4)
WBC: 4.3 10*3/uL (ref 3.4–10.8)

## 2022-06-14 LAB — LIPID PANEL
Chol/HDL Ratio: 2.3 ratio (ref 0.0–4.4)
Cholesterol, Total: 165 mg/dL (ref 100–199)
HDL: 72 mg/dL (ref >39)
LDL Chol Calc (NIH): 84 mg/dL (ref 0–99)
Triglycerides: 44 mg/dL (ref 0–149)
VLDL Cholesterol Cal: 9 mg/dL (ref 5–40)

## 2022-06-14 LAB — VITAMIN D 25 HYDROXY (VIT D DEFICIENCY, FRACTURES): Vit D, 25-Hydroxy: 34.9 ng/mL (ref 30.0–100.0)

## 2022-06-15 ENCOUNTER — Other Ambulatory Visit: Payer: Self-pay | Admitting: Family Medicine

## 2022-06-15 ENCOUNTER — Telehealth: Payer: Self-pay | Admitting: Family Medicine

## 2022-06-15 DIAGNOSIS — N1832 Chronic kidney disease, stage 3b: Secondary | ICD-10-CM

## 2022-06-15 NOTE — Telephone Encounter (Signed)
Needs to be contacted early o 10/23, needs to incease water intake , dehydrated with acute kidney injury, will need rept stat chem 7 and EGFr, non fasting drawn, before 12 midday so result can be obtained early afternoon on 06/18/2022, if not improved will need to go to ED for fluids I have also referred her to Nephrology. All this is already in result notes, but very important that this is done

## 2022-06-15 NOTE — Progress Notes (Signed)
Amb neph

## 2022-06-16 ENCOUNTER — Other Ambulatory Visit: Payer: Self-pay

## 2022-06-16 DIAGNOSIS — N1832 Chronic kidney disease, stage 3b: Secondary | ICD-10-CM

## 2022-06-16 DIAGNOSIS — D649 Anemia, unspecified: Secondary | ICD-10-CM

## 2022-06-16 NOTE — Telephone Encounter (Signed)
Patient aware.

## 2022-06-18 ENCOUNTER — Ambulatory Visit (HOSPITAL_COMMUNITY): Payer: PPO | Admitting: Occupational Therapy

## 2022-06-18 ENCOUNTER — Encounter (HOSPITAL_COMMUNITY): Payer: Self-pay | Admitting: Occupational Therapy

## 2022-06-18 DIAGNOSIS — R29898 Other symptoms and signs involving the musculoskeletal system: Secondary | ICD-10-CM | POA: Diagnosis not present

## 2022-06-18 DIAGNOSIS — G8929 Other chronic pain: Secondary | ICD-10-CM

## 2022-06-18 DIAGNOSIS — N1832 Chronic kidney disease, stage 3b: Secondary | ICD-10-CM | POA: Diagnosis not present

## 2022-06-18 LAB — BMP8+EGFR
BUN/Creatinine Ratio: 21 (ref 12–28)
BUN: 31 mg/dL — ABNORMAL HIGH (ref 8–27)
CO2: 28 mmol/L (ref 20–29)
Calcium: 9.4 mg/dL (ref 8.7–10.3)
Chloride: 99 mmol/L (ref 96–106)
Creatinine, Ser: 1.51 mg/dL — ABNORMAL HIGH (ref 0.57–1.00)
Glucose: 101 mg/dL — ABNORMAL HIGH (ref 70–99)
Potassium: 3.5 mmol/L (ref 3.5–5.2)
Sodium: 140 mmol/L (ref 134–144)
eGFR: 36 mL/min/{1.73_m2} — ABNORMAL LOW (ref >59)

## 2022-06-18 NOTE — Patient Instructions (Signed)
Theraband strengthening: Complete 10-15X, 1X/day   1) Shoulder protraction  Anchor band in doorway, stand with back to door. Push your hand forward as much as you can to bringing your shoulder blades forward on your rib cage.      2) Shoulder horizontal abduction  Standing with a theraband anchored at chest height, begin with arm straight and some tension in the band. Move your arm out to your side (keeping straight the whole time). Bring the affected arm back to midline.     3) Shoulder Internal Rotation  While holding an elastic band at your side with your elbow bent, start with your hand away from your stomach, then pull the band towards your stomach. Keep your elbow near your side the entire time.     4) Shoulder External Rotation  While holding an elastic band at your side with your elbow bent, start with your hand near your stomach and then pull the band away. Keep your elbow at your side the entire time.     5) Shoulder flexion  While standing with back to the door, holding Theraband at hand level, raise arm in front of you.  Keep elbow straight through entire movement.      6) Shoulder abduction  While holding an elastic band at your side, draw up your arm to the side keeping your elbow straight.         7) Towel Stretch with Internal Rotation       Gently pull up (or to the side) your affected arm  behind your back with the assist of a towel. Hold 10 seconds, repeat 3-5 times. 1-2 times/day.

## 2022-06-18 NOTE — Addendum Note (Signed)
Addended by: Guadelupe Sabin A on: 06/18/2022 10:28 AM   Modules accepted: Orders

## 2022-06-18 NOTE — Therapy (Addendum)
OUTPATIENT OCCUPATIONAL THERAPY ORTHO REASSESSMENT & TREATMENT, DISCHARGE SUMMARY  Patient Name: Desiree [name] MRN: [id number] DOB:04/23/1946, 76 y.o., female Today's Date: 06/18/2022  PCP: Tula Nakayama, MD REFERRING PROVIDER: Tania Ade, MD   OT End of Session - 06/18/22 1020     Visit Number 6    Number of Visits 7    Date for OT Re-Evaluation 06/11/22    Authorization Type 1) [employer]    2) [employer] [place]    Authorization Time Period no auth, no visit limit    OT Start Time 0951    OT Stop Time 1018    OT Time Calculation (min) 27 min    Activity Tolerance Patient tolerated treatment well    Behavior During Therapy WFL for tasks assessed/performed             Past Medical History:  Diagnosis Date   Allergic rhinitis    Arthritis HIPS/ WRISTS   Cancer ([place])    DDD (degenerative disc disease), cervical    GERD (gastroesophageal reflux disease) OCCASIONAL   Heart murmur    turbulent flow through LVOT, no obstruction 08/26/18 echo   History of Bell's palsy 2011  RIGHT SIDE -- RESOLVED   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--  S/P  RIGHT MASTECTMOY AND RADIATION--  NO RECURRANCE   History of CVA (cerebrovascular accident) PER SCAN IN 2011   Hyperlipidemia    Hypertension    Impaired fasting glucose PER PCP  DR Moshe Cipro   WATCH DIET   Mixed urge and stress incontinence    Nocturia    Sinus drainage    Thoracic ascending aortic aneurysm ([place]) LAST CHEST CT 09-02-2010--  FOLLOWED BY  CARDIOLOGIST--  DR [id number]   Vaginal wall prolapse    Past Surgical History:  Procedure Laterality Date   ABDOMINAL HYSTERECTOMY     BILATERAL CARPAL TUNNEL RELEASE  1980'S   CARDIAC CATHETERIZATION  02-01-2009  DR Gresham   NORMAL CORONARY ARTERIES/ NORMAL LV SIZE AND FUNCTION/ AORTIC ROOT SIZE AT THE UPPER LIMIT OF NORMAL    CARPAL TUNNEL RELEASE     CERVICAL [place] SURGERY  12-12-2005  DR Vertell Limber   LEFT  C6 - C7 HERINATED / DDD/ SPONDYLOSIS    COLONOSCOPY N/A 02/10/2014   Surgeon: Danie Binder, MD; Normal TI, mild diverticulosis in sigmoid and transverse colon, small external hemorrhoids, no obvious source for anemia.  Repeat colonoscopy in 10-15 years if benefits outweigh risk.   CYSTOCELE REPAIR  08/02/2012   Procedure: ANTERIOR REPAIR (CYSTOCELE);  Surgeon: Ailene Rud, MD;  Location: [facility];  Service: Urology;  Laterality: N/A;  [employer] Uphold Anterior Pelvic Floor Sacrospinus Repair. Anterior wall of the vagina.   ESOPHAGOGASTRODUODENOSCOPY N/A 02/10/2014   Surgeon: Danie Binder, MD;   Mild gastritis/duodenitis s/p biopsied, no obvious source for anemia.  Pathology with mild chronic gastritis with intestinal metaplasia, negative for H. pylori, duodenal biopsy benign.   GIVENS CAPSULE STUDY N/A 03/13/2014   Surgeon: Danie Binder, MD;  Normal exam.  Suspected anemia most likely due to chronic disease.   JOINT REPLACEMENT N/A    [employer] 06/16/2020   NECK SURGERY  2007   RIGHT PARTIAL MASTECTOMY  11-11-2004  DR PETER YOUNG   DUCTAL CARCINOMA IN SITU RIGHT BREAST   TOTAL ABDOMINAL HYSTERECTOMY W/ BILATERAL SALPINGOOPHORECTOMY  1990   TOTAL KNEE ARTHROPLASTY Right 12/21/2019   Procedure: RIGHT  TOTAL KNEE ARTHROPLASTY;  Surgeon: [name], MD;  Location: [facility];  Service: Orthopedics;  Laterality: Right;   TRANSTHORACIC ECHOCARDIOGRAM  03-30-2008  DR MARGARET SIMPSON   LV SIZE AND FUNCTION NORMAL/ MODERATE AORTIC ARCH DILATATION/ MILD MR   Patient Active Problem List   Diagnosis Date Noted   Rash and nonspecific skin eruption 06/13/2022   Stage 3b chronic kidney disease (CKD) ([place]) 03/20/2022   AKI (acute kidney injury) ([place]) 03/19/2022   Iron deficiency anemia 01/30/2022   Vertigo 07/10/2021   Unilateral primary osteoarthritis, left knee 09/13/2020   S/P TKR (total knee replacement), right 12/21/2019   Fatigue 10/18/2019   Left shoulder pain 04/11/2019   Ganglion cyst of volar  aspect of left wrist 10/05/2018   Allergic cough 05/14/2016   Neck pain on left side 01/01/2016   Encounter for annual physical exam 08/02/2015   Left ventricular outflow tract obstruction 56/21/3086   Metabolic syndrome X 57/84/6962   Osteopenia 12/12/2013   Anemia 11/29/2013   Morbid obesity ([place]) 10/17/2012   Aneurysm of thoracic aorta ([place]) 10/12/2012   Allergic rhinitis 03/02/2012   Disorder of bladder 09/24/2010   Malignant neoplasm of female breast ([place]) 02/04/2008   Hyperlipidemia 02/04/2008   Essential hypertension 02/04/2008    ONSET DATE: Ongoing pain for 3+ years  REFERRING DIAG: shoulder glenohumeral osteoarthritis   THERAPY DIAG:  Chronic left shoulder pain  Other symptoms and signs involving the musculoskeletal system  Rationale for Evaluation and Treatment Rehabilitation  SUBJECTIVE:   SUBJECTIVE STATEMENT: S: "I think it's going to always hurt with the cartilage gone."   PRECAUTIONS: None  WEIGHT BEARING RESTRICTIONS No  PAIN:  Are you having pain? Yes: NPRS scale: 4/10 Pain location: left shoulder Pain description: sore Aggravating factors: movement Relieving factors: rest, ibuprofen Pain with movement  PATIENT GOALS "Less pain."  OBJECTIVE:   FUNCTIONAL OUTCOME MEASURES: FOTO: 48.43 06/18/22: 62/100  UPPER EXTREMITY ROM     Active ROM Left eval Left 06/18/22  Shoulder flexion 102 140  Shoulder abduction 100 155  Shoulder external rotation 22 45  (Blank rows = not tested)  Passive ROM Left eval  Shoulder flexion 98  Shoulder abduction 103  Shoulder external rotation 30  (Blank rows = not tested)   UPPER EXTREMITY MMT:     MMT Left eval Left 06/18/22  Shoulder flexion 3+/5 4+/5  Shoulder abduction 3/5 4/5  Shoulder internal rotation 4-/5 5/5  Shoulder external rotation 3/5 4/5  (Blank rows = not tested)  HAND FUNCTION: Grip strength: Right: 62 lbs; Left: 52 lbs   OBSERVATIONS: Fascial restrictions throughout upper  quadrant-trapezius, axillary region, deltoids    TODAY'S TREATMENT:       06/18/22   -Red theraband strengthening: standing-protraction, flexion, horizontal abduction, er/IR, abduction, 10 reps   -IR stretch behind back with towel: 3x10" holds     06/03/22 -Manual Therapy: myofascial release and trigger point on entire LUE to address pain, fascial restrictions and improve ROM.  -AA/ROM: seated, flexion, abduction, horizontal abduction, protraction, er/IR, 1x10 -A/ROM: seated, flexion, abduction, horizontal abduction, er/IR, 1x10 -Strengthening: 3lb dumbbell, side lying, flexion, extension, abduction, horizontal abduction, er/IR, 1x10    05/27/22 -Manual Therapy: myofascial release and trigger point on entire LUE to address pain, fascial restrictions and improve ROM.  -AA/ROM: 3lb dowel, supine, flexion, abduction, horizontal abduction, protraction, er/IR, 1x10 -A/ROM: supine, flexion, abduction, horizontal abduction, protraction, er/IR, 1x10 -Strengthening: 2lb dumbbell, side lying, flexion, extension, abduction, horizontal abduction, er/IR, 1x10 -Scapula Strengthening: Red theraband, extension, rows, retraction, 1x10     PATIENT EDUCATION: Education details:  theraband strengthening-red Person educated: Patient Education method: Explanation, Demonstration, and Handouts Education comprehension: verbalized understanding and returned demonstration   HOME EXERCISE PROGRAM: 05/14/22: A/ROM and wall slides 05/22/22: Shoulder stretches 05/27/22: Scapula Strengthening 06/03/22: Shoulder Strengthening 06/18/22: red theraband shoulder strengthening  GOALS: Goals reviewed with patient? Yes  SHORT TERM GOALS: Target date:  06/11/22     Pt will be provided with and educated on HEP to improve mobility in LUE required for ADL completion.   Goal status: MET  2.  Pt will decrease pain in LUE to 3/10 or less in order to sleep through the night without waking due to pain.   Goal status:  PARTIALLY MET  3.  Pt will decrease LUE fascial restrictions to minimal amounts or less to improve mobility required for overhead reaching tasks during meal preparation.   Goal status: MET  4.  Pt will increase A/ROM of LUE to [facility] to improve ability to reach overhead and behind back during dressing and bathing tasks.   Goal status: MET  5.  Pt will increase strength in LUE to 4/5 to improve ability to perform lifting tasks required for carrying groceries and putting them over in upper cabinets.    Goal status: MET    ASSESSMENT:  CLINICAL IMPRESSION: A: Reassessment completed this session, pt has met 4/5 goals and has partially met the remaining goal. Pt reports she is using the LUE during ADLs with minimal to no difficulty, does not attempt to lift anything too heavy. Pt demonstrating ROM and strength WFL and equivalent to RUE, with exception of abduction in which strength is slightly less than RUE. Pt is completing strengthening HEP at home, reports IR stretch with vertical towel is very painful. Updated HEP for theraband shoulder strengthening, transitioned to horizontal towel for IR with much improved strength. Pt is agreeable to discharge today with HEP.      PLAN: OT FREQUENCY: 1x/week  OT DURATION: 6 weeks  PLANNED INTERVENTIONS: self care/ADL training, therapeutic exercise, therapeutic activity, manual therapy, passive range of motion, electrical stimulation, ultrasound, moist heat, cryotherapy, patient/family education, cognitive remediation/compensation, energy conservation, coping strategies training, and DME and/or AE instructions  CONSULTED AND AGREED WITH PLAN OF CARE: Patient  PLAN FOR NEXT SESSION: Discharge pt   Guadelupe Sabin, OTR/L  [phone] 06/18/2022, 10:20 AM   OCCUPATIONAL THERAPY DISCHARGE SUMMARY  Visits from Start of Care: 6  Current functional level related to goals / functional outcomes: See above. Pt has met 4/5 goals, remaining goal  partially met. Reports improvement in function and in ability to use LUE during ADLs.    Remaining deficits: Slightly decreased strength and activity tolerance   Education / Equipment: HEP for strengthening, stretches   Patient agrees to discharge. Patient goals were met. Patient is being discharged due to meeting the stated rehab goals.Marland Kitchen

## 2022-06-25 ENCOUNTER — Encounter (HOSPITAL_COMMUNITY): Payer: PPO | Admitting: Physical Therapy

## 2022-06-25 ENCOUNTER — Encounter (HOSPITAL_COMMUNITY): Payer: PPO | Admitting: Occupational Therapy

## 2022-07-02 DIAGNOSIS — M19012 Primary osteoarthritis, left shoulder: Secondary | ICD-10-CM | POA: Diagnosis not present

## 2022-07-07 ENCOUNTER — Ambulatory Visit (INDEPENDENT_AMBULATORY_CARE_PROVIDER_SITE_OTHER): Payer: PPO

## 2022-07-07 DIAGNOSIS — Z0000 Encounter for general adult medical examination without abnormal findings: Secondary | ICD-10-CM

## 2022-07-07 NOTE — Progress Notes (Signed)
Subjective:   Desiree [name] is a 76 y.o. female who presents for Medicare Annual (Subsequent) preventive examination. I connected with  Desiree [name] on 07/07/22 by a audio enabled telemedicine application and verified that I am speaking with the correct person using two identifiers.  Patient Location: Home  Provider Location: Office/Clinic  I discussed the limitations of evaluation and management by telemedicine. The patient expressed understanding and agreed to proceed.  Review of Systems           Objective:    There were no vitals filed for this visit. There is no height or weight on file to calculate BMI.     04/30/2022    9:55 AM 07/05/2021    3:36 PM 06/19/2020    2:58 PM 01/09/2020   11:16 AM 12/21/2019    8:15 PM 12/14/2019   10:21 AM 07/01/2018    3:16 PM  Advanced Directives  Does Patient Have a Medical Advance Directive? Yes Yes No Yes Yes No No  Type of Advance Directive Living will Living will  Living will Living will    Does patient want to make changes to medical advance directive?  Yes (MAU/Ambulatory/Procedural Areas - Information given)   No - Patient declined    Would patient like information on creating a medical advance directive? No - Patient declined  Yes (MAU/Ambulatory/Procedural Areas - Information given) No - Patient declined  No - Patient declined No - Patient declined    Current Medications (verified) Outpatient Encounter Medications as of 07/07/2022  Medication Sig   acetaminophen (TYLENOL) 500 MG tablet Take one to  two tablets by mouth at night for pain   Ascorbic Acid (VITAMIN C) 1000 MG tablet Take 1,000 mg by mouth daily.   aspirin EC 325 MG tablet Take 1 tablet (325 mg total) by mouth daily.   betamethasone dipropionate 0.05 % cream Apply sparingly two times daily for 10 days, then as needed, to affected areas( hairline, neck)   carvedilol (COREG) 3.125 MG tablet TAKE 1 TABLET(3.125 MG) BY MOUTH TWICE DAILY WITH A MEAL   chlorthalidone  (HYGROTON) 50 MG tablet Take 1 tablet (50 mg total) by mouth daily.   Cholecalciferol (VITAMIN D) 50 MCG (2000 UT) tablet Take 2,000 Units by mouth daily.    cycloSPORINE (RESTASIS) 0.05 % ophthalmic emulsion 1 drop 2 (two) times daily.   Ferrous Sulfate (SLOW FE PO) Take 1 capsule by mouth every other day.   losartan (COZAAR) 100 MG tablet Take 50 mg by mouth daily.   lovastatin (MEVACOR) 40 MG tablet Take 1 tablet by mouth once daily   montelukast (SINGULAIR) 10 MG tablet Take 1 tablet (10 mg total) by mouth at bedtime.   Multiple Vitamin (MULTIVITAMIN) tablet Take 1 tablet by mouth daily. One a day woman's   potassium chloride (KLOR-CON) 10 MEQ tablet Take 1 tablet (10 mEq total) by mouth daily.   terbinafine (LAMISIL) 1 % cream Apply twice daily to rash on scalp for 1 week, then , as needed   zinc gluconate 50 MG tablet Take 50 mg by mouth daily.   [DISCONTINUED] amLODipine (NORVASC) 10 MG tablet Take 1 tablet (10 mg total) by mouth daily.   No facility-administered encounter medications on file as of 07/07/2022.    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--  S/P  RIGHT MASTECTMOY AND RADIATION--  NO RECURRANCE   History of CVA (cerebrovascular accident) PER SCAN IN 2011   Hyperlipidemia    Hypertension    Impaired fasting glucose PER PCP  DR Moshe Cipro   WATCH DIET   Mixed urge and stress incontinence    Nocturia    Sinus drainage    Thoracic ascending aortic aneurysm ([place]) LAST CHEST CT 09-02-2010--  FOLLOWED BY  CARDIOLOGIST--  DR [id number]   Vaginal wall prolapse    Past Surgical  History:  Procedure Laterality Date   ABDOMINAL HYSTERECTOMY     BILATERAL CARPAL TUNNEL RELEASE  1980'S   CARDIAC CATHETERIZATION  02-01-2009  DR [place]   NORMAL CORONARY ARTERIES/ NORMAL LV SIZE AND FUNCTION/ AORTIC ROOT SIZE AT THE UPPER LIMIT OF NORMAL    CARPAL TUNNEL RELEASE     CERVICAL [place] SURGERY  12-12-2005  DR Vertell Limber   LEFT  C6 - C7 HERINATED / DDD/ SPONDYLOSIS   COLONOSCOPY N/A 02/10/2014   Surgeon: Danie Binder, MD; Normal TI, mild diverticulosis in sigmoid and transverse colon, small external hemorrhoids, no obvious source for anemia.  Repeat colonoscopy in 10-15 years if benefits outweigh risk.   CYSTOCELE REPAIR  08/02/2012   Procedure: ANTERIOR REPAIR (CYSTOCELE);  Surgeon: Ailene Rud, MD;  Location: [facility];  Service: Urology;  Laterality: N/A;  [employer] Uphold Anterior Pelvic Floor Sacrospinus Repair. Anterior wall of the vagina.   ESOPHAGOGASTRODUODENOSCOPY N/A 02/10/2014   Surgeon: Danie Binder, MD;   Mild gastritis/duodenitis s/p biopsied, no obvious source for anemia.  Pathology with mild chronic gastritis with intestinal metaplasia, negative for H. pylori, duodenal biopsy benign.   GIVENS CAPSULE STUDY N/A 03/13/2014   Surgeon: Danie Binder, MD;  Normal exam.  Suspected anemia most likely due to chronic disease.   JOINT REPLACEMENT N/A    [employer] 06/16/2020   NECK SURGERY  2007   RIGHT PARTIAL MASTECTOMY  11-11-2004  DR PETER YOUNG   DUCTAL CARCINOMA IN SITU RIGHT BREAST   TOTAL ABDOMINAL HYSTERECTOMY W/ BILATERAL SALPINGOOPHORECTOMY  1990   TOTAL KNEE ARTHROPLASTY Right 12/21/2019   Procedure: RIGHT  TOTAL KNEE ARTHROPLASTY;  Surgeon: [name], MD;  Location: [place];  Service: Orthopedics;  Laterality: Right;   TRANSTHORACIC ECHOCARDIOGRAM  03-30-2008  DR MARGARET SIMPSON   LV SIZE AND FUNCTION NORMAL/ MODERATE AORTIC ARCH DILATATION/ MILD MR   Family History  Problem Relation Age of Onset   Kidney failure  Mother    Hypertension Mother    Heart attack Mother 24   Throat cancer Father    Prostate cancer Father    Lung cancer Father 32   Breast cancer Sister    Hypertension Brother        2 stents   Colon cancer Neg Hx    Colon polyps Neg Hx    Social History   Socioeconomic History   Marital status: Divorced    Spouse name: Not on file   Number of children: 4   Years of education: 12+   Highest education level: Some college, no degree  Occupational History   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  Tobacco Use  Smoking status: Never   Smokeless tobacco: Never  Vaping Use   Vaping Use: Never used  Substance and Sexual Activity   Alcohol use: No   Drug use: No   Sexual activity: Never  Other Topics Concern   Not on file  Social History Narrative   RETIRED IN June 2020.    Social Determinants of Health   Financial Resource Strain: Low Risk  (07/05/2021)   Overall Financial Resource Strain (CARDIA)    Difficulty of Paying Living Expenses: Not hard at all  Food Insecurity: No Food Insecurity (07/05/2021)   Hunger Vital Sign    Worried About Running Out of Food in the Last Year: Never true    Ran Out of Food in the Last Year: Never true  Transportation Needs: No Transportation Needs (07/05/2021)   PRAPARE - Hydrologist (Medical): No    Lack of Transportation (Non-Medical): No  Physical Activity: Insufficiently Active (07/05/2021)   Exercise Vital Sign    Days of Exercise per Week: 3 days    Minutes of Exercise per Session: 30 min  Stress: No Stress Concern Present (06/19/2020)   [place]    Feeling of Stress : Not at all  Social Connections: Moderately Integrated (07/05/2021)   Social Connection and Isolation Panel [NHANES]    Frequency of Communication with Friends and Family: More than three times a week    Frequency of  Social Gatherings with Friends and Family: More than three times a week    Attends Religious Services: More than 4 times per year    Active Member of Clubs or Organizations: Yes    Attends Archivist Meetings: More than 4 times per year    Marital Status: Divorced    Tobacco Counseling Counseling given: Not Answered   Clinical Intake:                 Diabetic?no         Activities of Daily Living     No data to display          Patient Care Team: [name], MD as PCP - General (Family Medicine) Croitoru, Dani Gobble, MD as PCP - Cardiology (Cardiology) Grace Isaac, MD (Inactive) as Consulting Physician (Cardiothoracic Surgery) Croitoru, Dani Gobble, MD as Consulting Physician (Cardiology) Hortencia Pilar, MD as Consulting Physician (Ophthalmology) Carolan Clines, MD (Inactive) as Consulting Physician (Urology) Eloise Harman, DO as Consulting Physician (Gastroenterology)  Indicate any recent [place] you may have received from other than [facility] providers in the past year (date may be approximate).     Assessment:   This is a routine wellness examination for Leilyn.  Hearing/Vision screen No results found.  Dietary issues and exercise activities discussed:     Goals Addressed   None    Depression Screen    06/13/2022   10:10 AM 03/12/2022   10:05 AM 01/03/2022    9:07 AM 09/10/2021   10:23 AM 07/10/2021    9:19 AM 07/05/2021    3:25 PM 06/12/2021   10:04 AM  PHQ 2/9 Scores  PHQ - 2 Score 0 0 0 0 0 0 0  PHQ- 9 Score     0 0     Fall Risk    06/13/2022   10:10 AM 03/12/2022   10:05 AM 01/03/2022    9:07 AM 09/10/2021   10:23 AM 07/10/2021    9:19 AM  Fall Risk  Falls in the past year? 0 0 0 0 0  Number falls in past yr: 0 0 0 0 0  Injury with Fall? 0 0 0 0 0  Risk for fall due to : No Fall Risks No Fall Risks No Fall Risks No Fall Risks   Follow up Falls evaluation completed Falls evaluation completed  Falls evaluation completed Falls evaluation completed     [place]:  Any stairs in or around the home? Yes  If so, are there any without handrails? No  Home free of loose throw rugs in walkways, pet beds, electrical cords, etc? Yes  Adequate lighting in your home to reduce risk of falls? Yes   ASSISTIVE DEVICES UTILIZED TO PREVENT FALLS:  Life alert? No  Use of a cane, [place] or w/c? No  Grab bars in the bathroom? No  Shower chair or bench in shower? No  Elevated toilet seat or a handicapped toilet? No   TIMED UP AND GO:  Was the test performed? No .  Length of time to ambulate 10 feet:  sec.     Cognitive Function:    09/21/2014    3:28 PM  MMSE - Mini Mental State Exam  Orientation to time 5  Orientation to Place 5  Registration 3  Attention/ Calculation 4  Recall 3  Language- name 2 objects 2  Language- repeat 1  Language- follow 3 step command 2  Language- read & follow direction 1  Write a sentence 1  Copy design 1  Total score 28        07/05/2021    3:45 PM 06/19/2020    3:02 PM 06/16/2019    3:05 PM 06/14/2018    3:55 PM 06/10/2017    3:23 PM  6CIT Screen  What Year? 0 points 0 points 0 points 0 points 0 points  What month? 0 points 0 points 0 points 0 points 0 points  What time? 0 points 0 points 0 points 0 points 0 points  Count back from 20 0 points 0 points 0 points 0 points 0 points  Months in reverse 0 points 0 points 0 points 0 points 0 points  Repeat phrase 10 points 0 points 0 points 2 points 0 points  Total Score 10 points 0 points 0 points 2 points 0 points    Immunizations Immunization History  Administered Date(s) Administered   Fluad Quad(high Dose 65+) 04/25/2019, 06/12/2021, 06/13/2022   Influenza Split 07/15/2011, 06/30/2012   Influenza Whole 06/13/2010   Influenza,inj,Quad PF,6+ Mos 07/13/2013, 05/22/2014, 06/12/2015, 07/08/2016, 05/27/2017, 05/05/2018, 04/20/2020   Moderna Sars-Covid-2  Vaccination 10/06/2019, 11/07/2019, 07/05/2020, 03/18/2021   Pneumococcal Conjugate-13 09/21/2014   Pneumococcal Polysaccharide-23 11/11/2011   Tdap 07/15/2011   Zoster Recombinat (Shingrix) 12/10/2021   Zoster, Live 11/09/2006    TDAP status: Due, Education has been provided regarding the importance of this vaccine. Advised may receive this vaccine at local pharmacy or Health Dept. Aware to provide a copy of the vaccination record if obtained from local pharmacy or Health Dept. Verbalized acceptance and understanding.  Flu Vaccine status: Up to date  Pneumococcal vaccine status: Up to date  Covid-19 vaccine status: Information provided on how to obtain vaccines.   Qualifies for Shingles Vaccine? Yes   Zostavax completed No   Shingrix Completed?: No.    Education has been provided regarding the importance of this vaccine. Patient has been advised to call insurance company to determine out of pocket expense if they have  not yet received this vaccine. Advised may also receive vaccine at local pharmacy or Health Dept. Verbalized acceptance and understanding.  Screening Tests Health Maintenance  Topic Date Due   COVID-19 Vaccine (5 - Moderna risk series) 05/13/2021   TETANUS/TDAP  07/14/2021   Zoster Vaccines- Shingrix (2 of 2) 02/04/2022   Medicare Annual Wellness (AWV)  07/05/2022   COLONOSCOPY (Pts 45-107yr Insurance coverage will need to be confirmed)  02/13/2024   Pneumonia Vaccine 75 Years old  Completed   INFLUENZA VACCINE  Completed   DEXA SCAN  Completed   Hepatitis C Screening  Completed   HPV VACCINES  Aged Out    Health Maintenance  Health Maintenance Due  Topic Date Due   COVID-19 Vaccine (5 - Moderna risk series) 05/13/2021   TETANUS/TDAP  07/14/2021   Zoster Vaccines- Shingrix (2 of 2) 02/04/2022   Medicare Annual Wellness (AWV)  07/05/2022    Colorectal cancer screening: Type of screening: Colonoscopy. Completed 02/12/14. Repeat every 10 years  Mammogram  status: No longer required due to age.  Bone Density status: Completed 09/14/20. Results reflect: Bone density results: OSTEOPENIA. Repeat every 0 years.  Lung Cancer Screening: (Low Dose CT Chest recommended if Age 75-80years, 30 pack-year currently smoking OR have quit w/in 15years.) does not qualify.   Lung Cancer Screening Referral: no  Additional Screening:  Hepatitis C Screening: does not qualify; Completed 02/12/17  Vision Screening: Recommended annual ophthalmology exams for early detection of glaucoma and other disorders of the eye. Is the patient up to date with their annual eye exam?  Yes  Who is the provider or what is the name of the office in which the patient attends annual eye exams? [facility] V[facility]iseo SquiresIf pt is not established with a provider, would they like to be referred to a provider to establish care? No .   Dental Screening: Recommended annual dental exams for proper oral hygiene  Community Resource Referral / Chronic Care Management: CRR required this visit?  No   CCM required this visit?  No      Plan:     I have personally reviewed and noted the following in the patient's chart:   Medical and social history Use of alcohol, tobacco or illicit drugs  Current medications and supplements including opioid prescriptions. Patient is not currently taking opioid prescriptions. Functional ability and status Nutritional status Physical activity Advanced directives List of other physicians Hospitalizations, surgeries, and ER visits in previous 12 months Vitals Screenings to include cognitive, depression, and falls Referrals and appointments  In addition, I have reviewed and discussed with patient certain preventive protocols, quality metrics, and best practice recommendations. A written personalized care plan for preventive services as well as general preventive health recommendations were provided to patient.     JJill Side  [place]  07/07/2022   Nurse Notes:

## 2022-07-07 NOTE — Patient Instructions (Signed)
  Ms. Furnish , Thank you for taking time to come for your Medicare Wellness Visit. I appreciate your ongoing commitment to your health goals. Please review the following plan we discussed and let me know if I can assist you in the future.   These are the goals we discussed:  Goals       Cut out extra servings (pt-stated)      I would love to talk with a dietician and learn to eat better.  Pt states she would be interested in a dietician      Increase physical activity      Pt walks a mile 3 times a week and more if time allows.      Patient Stated      Patient states that her goal is to loose more weight.        This is a list of the screening recommended for you and due dates:  Health Maintenance  Topic Date Due   COVID-19 Vaccine (5 - Moderna risk series) 05/13/2021   Tetanus Vaccine  07/14/2021   Zoster (Shingles) Vaccine (2 of 2) 02/04/2022   Medicare Annual Wellness Visit  07/08/2023   Colon Cancer Screening  02/13/2024   Pneumonia Vaccine  Completed   Flu Shot  Completed   DEXA scan (bone density measurement)  Completed   Hepatitis C Screening: USPSTF Recommendation to screen - Ages 10-79 yo.  Completed   HPV Vaccine  Aged Out

## 2022-07-08 ENCOUNTER — Other Ambulatory Visit: Payer: Self-pay

## 2022-07-08 ENCOUNTER — Telehealth: Payer: Self-pay | Admitting: Family Medicine

## 2022-07-08 MED ORDER — CHLORTHALIDONE 50 MG PO TABS: 50.0000 mg | ORAL_TABLET | Freq: Every day | ORAL | 1 refills | Status: DC

## 2022-07-08 NOTE — Telephone Encounter (Signed)
90day supply sent.

## 2022-07-08 NOTE — Telephone Encounter (Signed)
Patient called asked if she could get 90 day supply instead of 30 day supply,. Patient going out of town and need more pills  chlorthalidone (HYGROTON) 50 MG tablet   Pharmacy: [facility]

## 2022-07-09 LAB — SPECIMEN STATUS REPORT

## 2022-07-09 LAB — FERRITIN: Ferritin: 248 ng/mL — ABNORMAL HIGH (ref 15–150)

## 2022-07-09 LAB — IRON: Iron: 61 ug/dL (ref 27–139)

## 2022-08-05 DIAGNOSIS — N189 Chronic kidney disease, unspecified: Secondary | ICD-10-CM | POA: Diagnosis not present

## 2022-08-05 DIAGNOSIS — R809 Proteinuria, unspecified: Secondary | ICD-10-CM | POA: Diagnosis not present

## 2022-08-05 DIAGNOSIS — N170 Acute kidney failure with tubular necrosis: Secondary | ICD-10-CM | POA: Diagnosis not present

## 2022-08-05 DIAGNOSIS — N1832 Chronic kidney disease, stage 3b: Secondary | ICD-10-CM | POA: Diagnosis not present

## 2022-08-05 DIAGNOSIS — D631 Anemia in chronic kidney disease: Secondary | ICD-10-CM | POA: Diagnosis not present

## 2022-08-05 DIAGNOSIS — I129 Hypertensive chronic kidney disease with stage 1 through stage 4 chronic kidney disease, or unspecified chronic kidney disease: Secondary | ICD-10-CM | POA: Diagnosis not present

## 2022-08-06 DIAGNOSIS — I129 Hypertensive chronic kidney disease with stage 1 through stage 4 chronic kidney disease, or unspecified chronic kidney disease: Secondary | ICD-10-CM | POA: Diagnosis not present

## 2022-08-06 DIAGNOSIS — D631 Anemia in chronic kidney disease: Secondary | ICD-10-CM | POA: Diagnosis not present

## 2022-08-06 DIAGNOSIS — N189 Chronic kidney disease, unspecified: Secondary | ICD-10-CM | POA: Diagnosis not present

## 2022-08-06 DIAGNOSIS — N1832 Chronic kidney disease, stage 3b: Secondary | ICD-10-CM | POA: Diagnosis not present

## 2022-08-06 DIAGNOSIS — Z6836 Body mass index (BMI) 36.0-36.9, adult: Secondary | ICD-10-CM | POA: Diagnosis not present

## 2022-08-12 ENCOUNTER — Other Ambulatory Visit: Payer: Self-pay | Admitting: Family Medicine

## 2022-09-03 ENCOUNTER — Other Ambulatory Visit: Payer: Self-pay | Admitting: Thoracic Surgery (Cardiothoracic Vascular Surgery)

## 2022-09-03 DIAGNOSIS — I7121 Aneurysm of the ascending aorta, without rupture: Secondary | ICD-10-CM

## 2022-10-08 NOTE — Progress Notes (Signed)
[address]       [place] [address]             [phone]    PCP is [name], MD Referring Provider is [name], MD  Chief Complaint: Ascending thoracic aortic aneurysm  HPI: This is a 77 year old female with a past medical history of hypertension, hyperlipidemia, right breast cancer (s/p mastectomy, Radiation), Bell's palsy, GERD, heart murmur (dynamic systolic ejection murmur related to LV outflow tract obstruction), arthritis, CVA, fasting glucose, mixed urge and stress incontinence who was found to have an ascending thoracic aortic aneurysm in 2009. She was last Seen by [facility] on 10/01/2021 and the ATAA was measured at 4.2 cm. She denies chest pain, pressure, tightness, LE edema.  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  RIGHT MASTECTMOY AND RADIATION--  NO RECURRANCE   History of CVA (cerebrovascular accident) PER SCAN IN 2011   Hyperlipidemia    Hypertension    Impaired fasting glucose PER PCP  DR Moshe Cipro   WATCH DIET   Mixed urge and stress incontinence    Nocturia    Sinus drainage    Thoracic ascending aortic aneurysm ([place]) LAST CHEST CT 09-02-2010--  FOLLOWED BY  CARDIOLOGIST--  DR [id number]   Vaginal wall prolapse     Past Surgical History:  Procedure Laterality Date   ABDOMINAL HYSTERECTOMY     BILATERAL CARPAL TUNNEL RELEASE  1980'S   CARDIAC CATHETERIZATION  02-01-2009  DR [place]   NORMAL CORONARY ARTERIES/ NORMAL LV SIZE AND FUNCTION/ AORTIC ROOT SIZE AT THE UPPER LIMIT OF NORMAL    CARPAL TUNNEL RELEASE     CERVICAL [place] SURGERY  12-12-2005  DR Vertell Limber   LEFT  C6 - C7  HERINATED / DDD/ SPONDYLOSIS   COLONOSCOPY N/A 02/10/2014   Surgeon: Danie Binder, MD; Normal TI, mild diverticulosis in sigmoid and transverse colon, small external hemorrhoids, no obvious source for anemia.  Repeat colonoscopy in 10-15 years if benefits outweigh risk.   CYSTOCELE REPAIR  08/02/2012   Procedure: ANTERIOR REPAIR (CYSTOCELE);  Surgeon: Ailene Rud, MD;  Location: [facility];  Service: Urology;  Laterality: N/A;  [employer] Uphold Anterior Pelvic Floor Sacrospinus Repair. Anterior wall of the vagina.   ESOPHAGOGASTRODUODENOSCOPY N/A 02/10/2014   Surgeon: Danie Binder, MD;   Mild gastritis/duodenitis s/p biopsied, no obvious source for anemia.  Pathology with mild chronic gastritis with intestinal metaplasia, negative for H. pylori, duodenal biopsy benign.   GIVENS CAPSULE STUDY N/A 03/13/2014   Surgeon: Danie Binder, MD;  Normal exam.  Suspected anemia most likely due to chronic disease.   JOINT REPLACEMENT N/A    [employer] 06/16/2020   NECK SURGERY  2007   RIGHT PARTIAL MASTECTOMY  11-11-2004  DR PETER YOUNG   DUCTAL CARCINOMA IN SITU RIGHT BREAST   TOTAL ABDOMINAL HYSTERECTOMY W/ BILATERAL SALPINGOOPHORECTOMY  1990   TOTAL KNEE ARTHROPLASTY Right 12/21/2019   Procedure: RIGHT  TOTAL KNEE ARTHROPLASTY;  Surgeon: [name], MD;  Location: [facility];  Service: Orthopedics;  Laterality:  Right;   TRANSTHORACIC ECHOCARDIOGRAM  03-30-2008  DR MARGARET SIMPSON   LV SIZE AND FUNCTION NORMAL/ MODERATE AORTIC ARCH DILATATION/ MILD MR    Family History  Problem Relation Age of Onset   Kidney failure Mother    Hypertension Mother    Heart attack Mother 73   Throat cancer Father    Prostate cancer Father    Lung cancer Father 66   Breast cancer Sister    Hypertension Brother        2 stents   Colon cancer Neg Hx    Colon polyps Neg Hx     Social History Social History   Tobacco Use   Smoking status: Never   Smokeless tobacco: Never   Vaping Use   Vaping Use: Never used  Substance Use Topics   Alcohol use: No   Drug use: No    Current Outpatient Medications  Medication Sig Dispense Refill   acetaminophen (TYLENOL) 500 MG tablet Take one to  two tablets by mouth at night for pain 50 tablet 0   Ascorbic Acid (VITAMIN C) 1000 MG tablet Take 1,000 mg by mouth daily.     aspirin EC 325 MG tablet Take 1 tablet (325 mg total) by mouth daily. 100 tablet 3   betamethasone dipropionate 0.05 % cream Apply sparingly two times daily for 10 days, then as needed, to affected areas( hairline, neck) 45 g 1   carvedilol (COREG) 3.125 MG tablet TAKE 1 TABLET(3.125 MG) BY MOUTH TWICE DAILY WITH A MEAL 180 tablet 3   chlorthalidone (HYGROTON) 50 MG tablet Take 1 tablet (50 mg total) by mouth daily. 90 tablet 1   Cholecalciferol (VITAMIN D) 50 MCG (2000 UT) tablet Take 2,000 Units by mouth daily.      cycloSPORINE (RESTASIS) 0.05 % ophthalmic emulsion 1 drop 2 (two) times daily.     Ferrous Sulfate (SLOW FE PO) Take 1 capsule by mouth every other day.     losartan (COZAAR) 100 MG tablet Take 50 mg by mouth daily.     lovastatin (MEVACOR) 40 MG tablet Take 1 tablet by mouth once daily 90 tablet 0   montelukast (SINGULAIR) 10 MG tablet Take 1 tablet (10 mg total) by mouth at bedtime. 90 tablet 0   Multiple Vitamin (MULTIVITAMIN) tablet Take 1 tablet by mouth daily. One a day woman's     potassium chloride (KLOR-CON) 10 MEQ tablet Take 1 tablet (10 mEq total) by mouth daily. 90 tablet 1   terbinafine (LAMISIL) 1 % cream Apply twice daily to rash on scalp for 1 week, then , as needed 42 g 0   zinc gluconate 50 MG tablet Take 50 mg by mouth daily.     Allergies  Allergen Reactions   Codeine Other (See Comments)    CONFUSION/ DIZZY Other reaction(s): Other (see comments) CONFUSION/ DIZZY   Quinolones Other (See Comments)    Patient was warned about not using Cipro and similar antibiotics. Recent studies have raised concern that  fluoroquinolone antibiotics could be associated with an increased risk of aortic aneurysm Fluoroquinolones have non-antimicrobial properties that might jeopardise the integrity of the extracellular matrix of the vascular wall I. Other reaction(s): Other (see comments) Patient was warned about not using Cipro and similar antibiotics. Recent studies have raised concern that fluoroquinolone antibiotics could be associated with an increased risk of aortic aneurysm Fluoroquinolones have non-antimicrobial properties that might jeopardise the integrity of the extracellular matrix of the vascular wall I.    Review of  Systems:  Chest Pain [  N] Exertional SOB [ Once in a while ]  Pedal Edema [ N ] Syncope [ N ] Presyncope [  N]  General Review of Systems: [Y] = yes [ N]=no  Consitutional:   nausea [ N];  fever [ N];  Eye :  Amaurosis fugax[  N];  Resp: cough Aqua.Slicker ];  hemoptysis[N ];  GI: vomiting[ N]; melena[N ]; hematochezia []$ N;  [id number] ]; Heme/Lymph: anemia[ Y];  Neuro: TIA[ N];stroke[N ];  seizures[ N];  Endocrine: diabetes[ N];   Vital Signs: Vitals:   10/14/22 1303  BP: 136/84  Pulse: (!) 58  Resp: 20  SpO2: 96%     Physical Exam CV-RRR, no murmur Neck-no carotid bruit Pulmonary-Clear to auscultation bilaterally Abdomen-Soft, non tender, mildly obese Extremities-No LE edema Neurologic-Grossly intact without focal deficit   Diagnostic Tests: Study Result  Narrative & Impression  CLINICAL DATA:  Aortic aneurysm suspected   EXAM: CT ANGIOGRAPHY CHEST WITH CONTRAST   TECHNIQUE: Multidetector CT imaging of the chest was performed using the standard protocol during bolus administration of intravenous contrast. Multiplanar CT image reconstructions and MIPs were obtained to evaluate the vascular anatomy.   RADIATION DOSE REDUCTION: This exam was performed according to the departmental dose-optimization program which includes automated exposure control, adjustment  of the mA and/or kV according to patient size and/or use of iterative reconstruction technique.   CONTRAST:  36m ISOVUE-370 IOPAMIDOL (ISOVUE-370) INJECTION 76%   COMPARISON:  CTA chest October 01, 2021.   FINDINGS: Cardiovascular: The ascending aorta measures 4.2 cm, unchanged.   Mediastinum/Nodes: No enlarged mediastinal, hilar, or axillary lymph nodes. Thyroid gland, trachea, and esophagus demonstrate no significant findings.   Lungs/Pleura: Right middle lobe scarring. No new consolidation. No visible pleural effusions or pneumothorax.   Upper Abdomen: Unchanged cyst in the posterior right hepatic lobe. No acute abnormality.   Musculoskeletal: No acute abnormality. Severe multilevel degenerative disease.   Review of the MIP images confirms the above findings.   IMPRESSION: 1. The ascending aorta measures 4.2 cm, unchanged. Recommend follow-up every 12 months and vascular consultation. This recommendation follows ACR consensus guidelines: White Paper of the ACR Incidental Findings Committee II on Vascular Findings. J Am Coll Radiol 2013; 10:789-794. 2. Similar right middle lobe scarring.  No acute abnormality.     Electronically Signed   By: FMargaretha SheffieldM.D.   On: 10/14/2022 13:02    Impression and Plan: CTA with a 4.2  cm ascending aortic aneurysm.  Echocardiogram done in 2020 showed a tri leaflet aortic valve and no AS or AI.  We discussed the natural history and and risk factors for growth of ascending aortic aneurysms.  We covered the importance of smoking cessation, tight blood pressure control, refraining from lifting heavy objects, and avoiding fluoroquinolones.  The patient is aware of signs and symptoms of aortic dissection and when to present to the emergency department.  We will continue surveillance and a repeat CTA was ordered for one year.    DNani Skillern PA-C [facility] [phone]

## 2022-10-14 ENCOUNTER — Ambulatory Visit
Admission: RE | Admit: 2022-10-14 | Discharge: 2022-10-14 | Disposition: A | Discharge status: Home / Self Care | Payer: PPO | Source: Ambulatory Visit | Attending: Thoracic Surgery (Cardiothoracic Vascular Surgery) | Admitting: Thoracic Surgery (Cardiothoracic Vascular Surgery)

## 2022-10-14 ENCOUNTER — Ambulatory Visit (INDEPENDENT_AMBULATORY_CARE_PROVIDER_SITE_OTHER): Payer: PPO | Admitting: Physician Assistant

## 2022-10-14 VITALS — BP 136/84 | HR 58 | Resp 20 | Ht 61.0 in | Wt 190.0 lb

## 2022-10-14 DIAGNOSIS — I7121 Aneurysm of the ascending aorta, without rupture: Secondary | ICD-10-CM

## 2022-10-14 DIAGNOSIS — I712 Thoracic aortic aneurysm, without rupture, unspecified: Secondary | ICD-10-CM | POA: Diagnosis not present

## 2022-10-14 DIAGNOSIS — K7689 Other specified diseases of liver: Secondary | ICD-10-CM | POA: Diagnosis not present

## 2022-10-14 DIAGNOSIS — Z0389 Encounter for observation for other suspected diseases and conditions ruled out: Secondary | ICD-10-CM | POA: Diagnosis not present

## 2022-10-14 DIAGNOSIS — J984 Other disorders of lung: Secondary | ICD-10-CM | POA: Diagnosis not present

## 2022-10-14 MED ORDER — IOPAMIDOL (ISOVUE-370) INJECTION 76%
60.0000 mL | Freq: Once | INTRAVENOUS | Status: AC | PRN
  Administered 2022-10-14: 60 mL via INTRAVENOUS

## 2022-10-14 NOTE — Patient Instructions (Addendum)
Risk Modification in those with ascending thoracic aortic aneurysm:  Continue good control of blood pressure (prefer SBP 130/80 or less)-continue Coreg, Amlodipine,and Losartan. Encouraged heart healthy, low salt diet.  2. Avoid fluoroquinolone antibiotics (I.e Ciprofloxacin, Avelox, Levofloxacin, Ofloxacin)  3.  Use of statin (to decrease cardiovascular risk)-continue Levastatin  4.  Exercise and activity limitations is individualized, but in general, contact sports are to be  avoided and one should avoid heavy lifting (defined as half of ideal body weight) and exercises involving sustained Valsalva maneuver.  5. Counseling for those suspected of having genetically mediated disease. First-degree relatives of those with TAA disease should be screened as well as those who have a connective tissue disease (I.e with Marfan syndrome, Ehlers-Danlos syndrome,  and Loeys-Dietz syndrome) or a  bicuspid aortic valve,have an increased risk for  complications related to TAA. To patient's knowledge, no family history of connective diseases. Echo done in January 2020 showed no AI or AS.  6. She has no history of tobacco abuse.

## 2022-10-24 ENCOUNTER — Ambulatory Visit: Payer: PPO | Admitting: Family Medicine

## 2022-10-29 ENCOUNTER — Encounter: Payer: Self-pay | Admitting: Family Medicine

## 2022-10-29 ENCOUNTER — Ambulatory Visit (INDEPENDENT_AMBULATORY_CARE_PROVIDER_SITE_OTHER): Payer: PPO | Admitting: Family Medicine

## 2022-10-29 VITALS — BP 119/74 | HR 89 | Ht 61.0 in | Wt 188.1 lb

## 2022-10-29 DIAGNOSIS — E559 Vitamin D deficiency, unspecified: Secondary | ICD-10-CM | POA: Diagnosis not present

## 2022-10-29 DIAGNOSIS — M79601 Pain in right arm: Secondary | ICD-10-CM

## 2022-10-29 DIAGNOSIS — I7121 Aneurysm of the ascending aorta, without rupture: Secondary | ICD-10-CM

## 2022-10-29 DIAGNOSIS — J309 Allergic rhinitis, unspecified: Secondary | ICD-10-CM | POA: Diagnosis not present

## 2022-10-29 DIAGNOSIS — Z1231 Encounter for screening mammogram for malignant neoplasm of breast: Secondary | ICD-10-CM

## 2022-10-29 DIAGNOSIS — E785 Hyperlipidemia, unspecified: Secondary | ICD-10-CM

## 2022-10-29 DIAGNOSIS — I10 Essential (primary) hypertension: Secondary | ICD-10-CM

## 2022-10-29 DIAGNOSIS — M542 Cervicalgia: Secondary | ICD-10-CM

## 2022-10-29 MED ORDER — GABAPENTIN 100 MG PO CAPS: ORAL_CAPSULE | ORAL | 0 refills | Status: DC

## 2022-10-29 MED ORDER — PREDNISONE 5 MG PO TABS: 5.0000 mg | ORAL_TABLET | Freq: Two times a day (BID) | ORAL | 0 refills | Status: AC

## 2022-10-29 NOTE — Patient Instructions (Signed)
F/U mid September  , call if you need me sooner  Short course prednisone is prescribed for Right arm pain, also bedtime gabapentin 100 mg as needed  Please  get fasting CBC, lipid, cmp and eGFr, TSH and vit D first week In September  Please schedule July mammogram , [facility], at checkout  It is important that you exercise regularly at least 30 minutes 5 times a week. If you develop chest pain, have severe difficulty breathing, or feel very tired, stop exercising immediately and seek medical attention ' Think about what you will eat, plan ahead. Choose " clean, green, fresh or frozen" over canned, processed or packaged foods which are more sugary, salty and fatty. 70 to 75% of food eaten should be vegetables and fruit. Three meals at set times with snacks allowed between meals, but they must be fruit or vegetables. Aim to eat over a 12 hour period , example 7 am to 7 pm, and STOP after  your last meal of the day. Drink water,generally about 64 ounces per day, no other drink is as healthy. Fruit juice is best enjoyed in a healthy way, by EATING the fruit. Thanks for choosing [facility], we consider it a privelige to serve you.

## 2022-10-29 NOTE — Progress Notes (Unsigned)
   WILLIS DVORKIN     MRN: [id number]      DOB: 08-29-1945   HPI Ms. Casco is here for follow up and re-evaluation of chronic medical conditions, medication management and review of any available recent lab and radiology data.  Preventive health is updated, specifically  Cancer screening and Immunization.   Questions or concerns regarding consultations or procedures which the PT has had in the interim are  addressed. The PT denies any adverse reactions to current medications since the last visit.  There are no new concerns.  There are no specific complaints   ROS Denies recent fever or chills. Denies sinus pressure, nasal congestion, ear pain or sore throat. Denies chest congestion, productive cough or wheezing. Denies chest pains, palpitations and leg swelling Denies abdominal pain, nausea, vomiting,diarrhea or constipation.   Denies dysuria, frequency, hesitancy or incontinence. Denies joint pain, swelling and limitation in mobility. Denies headaches, seizures, numbness, or tingling. Denies depression, anxiety or insomnia. Denies skin break down or rash.   PE  BP 119/74 (BP Location: Right Arm, Patient Position: Sitting, Cuff Size: Large)   Pulse 89   Ht '5\' 1"'$  (1.549 m)   Wt 188 lb 1.3 oz (85.3 kg)   SpO2 94%   BMI 35.54 kg/m   Patient alert and oriented and in no cardiopulmonary distress.  HEENT: No facial asymmetry, EOMI,     Neck supple .  Chest: Clear to auscultation bilaterally.  CVS: S1, S2 no murmurs, no S3.Regular rate.  ABD: Soft non tender.   Ext: No edema  MS: Adequate ROM spine, shoulders, hips and knees.  Skin: Intact, no ulcerations or rash noted.  Psych: Good eye contact, normal affect. Memory intact not anxious or depressed appearing.  CNS: CN 2-12 intact, power,  normal throughout.no focal deficits noted.   Assessment & Plan  No problem-specific Assessment & Plan notes found for this encounter.

## 2022-10-30 ENCOUNTER — Encounter: Payer: Self-pay | Admitting: Family Medicine

## 2022-10-30 DIAGNOSIS — E785 Hyperlipidemia, unspecified: Secondary | ICD-10-CM | POA: Insufficient documentation

## 2022-10-30 DIAGNOSIS — M79601 Pain in right arm: Secondary | ICD-10-CM | POA: Insufficient documentation

## 2022-10-30 DIAGNOSIS — E559 Vitamin D deficiency, unspecified: Secondary | ICD-10-CM | POA: Insufficient documentation

## 2022-10-30 NOTE — Assessment & Plan Note (Signed)
resollved

## 2022-10-30 NOTE — Assessment & Plan Note (Addendum)
Short course of prednisone and bedtime gabapentin 100 mg as needed, due to C spine disease

## 2022-10-30 NOTE — Assessment & Plan Note (Signed)
Updated lab needed at/ before next visit.   

## 2022-10-30 NOTE — Assessment & Plan Note (Signed)
Hyperlipidemia:Low fat diet discussed and encouraged.   Lipid Panel  Lab Results  Component Value Date   CHOL 165 06/13/2022   HDL 72 06/13/2022   LDLCALC 84 06/13/2022   TRIG 44 06/13/2022   CHOLHDL 2.3 06/13/2022     Updated lab needed at/ before next visit.

## 2022-10-30 NOTE — Assessment & Plan Note (Signed)
No change in size x 1 year, monitored annually by vascular, has had eval for 2024

## 2022-10-30 NOTE — Assessment & Plan Note (Signed)
Increased symptoms, using OTC combo prep with decongestant , benadryl and tylenol, advised to add daily montelukast as prescribed

## 2022-10-30 NOTE — Assessment & Plan Note (Signed)
  Patient re-educated about  the importance of commitment to a  minimum of 150 minutes of exercise per week as able.  The importance of healthy food choices with portion control discussed, as well as eating regularly and within a 12 hour window most days. The need to choose "clean , green" food 50 to 75% of the time is discussed, as well as to make water the primary drink and set a goal of 64 ounces water daily.       10/29/2022    8:58 AM 10/14/2022    1:03 PM 06/13/2022   10:09 AM  Weight /BMI  Weight 188 lb 1.3 oz 190 lb 190 lb  Height '5\' 1"'$  (1.549 m) '5\' 1"'$  (1.549 m) '5\' 1"'$  (1.549 m)  BMI 35.54 kg/m2 35.9 kg/m2 35.9 kg/m2

## 2022-10-30 NOTE — Assessment & Plan Note (Signed)
Controlled, no change in medication DASH diet and commitment to daily physical activity for a minimum of 30 minutes discussed and encouraged, as a part of hypertension management. The importance of attaining a healthy weight is also discussed.     10/29/2022    8:58 AM 10/14/2022    1:03 PM 06/13/2022   10:09 AM 04/23/2022    9:07 AM 03/19/2022   11:03 AM 03/19/2022   10:45 AM 03/12/2022   11:17 AM  BP/Weight  Systolic BP 123456 [id number]  Diastolic BP 74 84 82 80 78 76 90  Wt. (Lbs) 188.08 190 190 192.8  191.4   BMI 35.54 kg/m2 35.9 kg/m2 35.9 kg/m2 36.43 kg/m2  36.16 kg/m2

## 2022-11-10 DIAGNOSIS — D631 Anemia in chronic kidney disease: Secondary | ICD-10-CM | POA: Diagnosis not present

## 2022-11-10 DIAGNOSIS — N1832 Chronic kidney disease, stage 3b: Secondary | ICD-10-CM | POA: Diagnosis not present

## 2022-11-10 DIAGNOSIS — Z6836 Body mass index (BMI) 36.0-36.9, adult: Secondary | ICD-10-CM | POA: Diagnosis not present

## 2022-11-10 DIAGNOSIS — I129 Hypertensive chronic kidney disease with stage 1 through stage 4 chronic kidney disease, or unspecified chronic kidney disease: Secondary | ICD-10-CM | POA: Diagnosis not present

## 2022-11-10 DIAGNOSIS — N189 Chronic kidney disease, unspecified: Secondary | ICD-10-CM | POA: Diagnosis not present

## 2022-11-13 DIAGNOSIS — R809 Proteinuria, unspecified: Secondary | ICD-10-CM | POA: Diagnosis not present

## 2022-11-13 DIAGNOSIS — I129 Hypertensive chronic kidney disease with stage 1 through stage 4 chronic kidney disease, or unspecified chronic kidney disease: Secondary | ICD-10-CM | POA: Diagnosis not present

## 2022-11-13 DIAGNOSIS — D631 Anemia in chronic kidney disease: Secondary | ICD-10-CM | POA: Diagnosis not present

## 2022-11-13 DIAGNOSIS — N189 Chronic kidney disease, unspecified: Secondary | ICD-10-CM | POA: Diagnosis not present

## 2022-11-13 DIAGNOSIS — N1832 Chronic kidney disease, stage 3b: Secondary | ICD-10-CM | POA: Diagnosis not present

## 2022-11-18 ENCOUNTER — Other Ambulatory Visit: Payer: Self-pay | Admitting: Family Medicine

## 2022-12-03 ENCOUNTER — Other Ambulatory Visit: Payer: Self-pay | Admitting: Family Medicine

## 2023-01-01 ENCOUNTER — Encounter: Payer: Self-pay | Admitting: Family Medicine

## 2023-01-01 ENCOUNTER — Ambulatory Visit (INDEPENDENT_AMBULATORY_CARE_PROVIDER_SITE_OTHER): Payer: PPO | Admitting: Family Medicine

## 2023-01-01 ENCOUNTER — Ambulatory Visit (HOSPITAL_COMMUNITY)
Admission: RE | Admit: 2023-01-01 | Discharge: 2023-01-01 | Disposition: A | Discharge status: Home / Self Care | Payer: PPO | Source: Ambulatory Visit | Attending: Family Medicine | Admitting: Family Medicine

## 2023-01-01 VITALS — BP 130/80 | HR 79 | Ht 61.0 in | Wt 192.1 lb

## 2023-01-01 DIAGNOSIS — R051 Acute cough: Secondary | ICD-10-CM

## 2023-01-01 DIAGNOSIS — J309 Allergic rhinitis, unspecified: Secondary | ICD-10-CM | POA: Diagnosis not present

## 2023-01-01 DIAGNOSIS — J029 Acute pharyngitis, unspecified: Secondary | ICD-10-CM

## 2023-01-01 DIAGNOSIS — D708 Other neutropenia: Secondary | ICD-10-CM

## 2023-01-01 DIAGNOSIS — R059 Cough, unspecified: Secondary | ICD-10-CM | POA: Diagnosis not present

## 2023-01-01 DIAGNOSIS — I10 Essential (primary) hypertension: Secondary | ICD-10-CM

## 2023-01-01 DIAGNOSIS — N1832 Chronic kidney disease, stage 3b: Secondary | ICD-10-CM

## 2023-01-01 DIAGNOSIS — E559 Vitamin D deficiency, unspecified: Secondary | ICD-10-CM | POA: Diagnosis not present

## 2023-01-01 DIAGNOSIS — E785 Hyperlipidemia, unspecified: Secondary | ICD-10-CM | POA: Diagnosis not present

## 2023-01-01 LAB — POCT RAPID STREP A

## 2023-01-01 MED ORDER — AZITHROMYCIN 250 MG PO TABS: ORAL_TABLET | ORAL | 0 refills | Status: AC

## 2023-01-01 MED ORDER — PREDNISONE 5 MG PO TABS: 5.0000 mg | ORAL_TABLET | Freq: Two times a day (BID) | ORAL | 0 refills | Status: AC

## 2023-01-01 NOTE — Patient Instructions (Addendum)
F/U in September, as before, call if you need me sooner  Labs already ordered in March to be done today  CXR today  Throat swab today  Prednisone and Azithromycin are prescribed  Take montelukast once daily at bedtime  Allergy pill one twice daily  Robitussin dM one teaspoon once or twice daily    Thanks for choosing [facility], we consider it a privelige to serve you.

## 2023-01-02 LAB — CMP14+EGFR
ALT: 11 IU/L (ref 0–32)
AST: 29 IU/L (ref 0–40)
Albumin/Globulin Ratio: 1.8 (ref 1.2–2.2)
Albumin: 4.2 g/dL (ref 3.8–4.8)
Alkaline Phosphatase: 103 IU/L (ref 44–121)
BUN/Creatinine Ratio: 17 (ref 12–28)
BUN: 27 mg/dL (ref 8–27)
Bilirubin Total: 0.4 mg/dL (ref 0.0–1.2)
CO2: 27 mmol/L (ref 20–29)
Calcium: 9.3 mg/dL (ref 8.7–10.3)
Chloride: 100 mmol/L (ref 96–106)
Creatinine, Ser: 1.57 mg/dL — ABNORMAL HIGH (ref 0.57–1.00)
Globulin, Total: 2.4 g/dL (ref 1.5–4.5)
Glucose: 99 mg/dL (ref 70–99)
Potassium: 3.9 mmol/L (ref 3.5–5.2)
Sodium: 142 mmol/L (ref 134–144)
Total Protein: 6.6 g/dL (ref 6.0–8.5)
eGFR: 34 mL/min/{1.73_m2} — ABNORMAL LOW (ref >59)

## 2023-01-02 LAB — CBC
Hematocrit: 31.4 % — ABNORMAL LOW (ref 34.0–46.6)
Hemoglobin: 10.3 g/dL — ABNORMAL LOW (ref 11.1–15.9)
MCH: 28.9 pg (ref 26.6–33.0)
MCHC: 32.8 g/dL (ref 31.5–35.7)
MCV: 88 fL (ref 79–97)
Platelets: 284 10*3/uL (ref 150–450)
RBC: 3.56 x10E6/uL — ABNORMAL LOW (ref 3.77–5.28)
RDW: 13.1 % (ref 11.7–15.4)
WBC: 6.7 10*3/uL (ref 3.4–10.8)

## 2023-01-02 LAB — LIPID PANEL
Chol/HDL Ratio: 2 ratio (ref 0.0–4.4)
Cholesterol, Total: 161 mg/dL (ref 100–199)
HDL: 82 mg/dL (ref >39)
LDL Chol Calc (NIH): 69 mg/dL (ref 0–99)
Triglycerides: 48 mg/dL (ref 0–149)
VLDL Cholesterol Cal: 10 mg/dL (ref 5–40)

## 2023-01-02 LAB — VITAMIN D 25 HYDROXY (VIT D DEFICIENCY, FRACTURES): Vit D, 25-Hydroxy: 32.6 ng/mL (ref 30.0–100.0)

## 2023-01-02 LAB — TSH: TSH: 3.95 u[IU]/mL (ref 0.450–4.500)

## 2023-01-04 ENCOUNTER — Encounter: Payer: Self-pay | Admitting: Family Medicine

## 2023-01-04 DIAGNOSIS — J029 Acute pharyngitis, unspecified: Secondary | ICD-10-CM | POA: Insufficient documentation

## 2023-01-04 DIAGNOSIS — R051 Acute cough: Secondary | ICD-10-CM | POA: Insufficient documentation

## 2023-01-04 NOTE — Assessment & Plan Note (Addendum)
DG CXR , normal clear lung fields Z pack pescribed

## 2023-01-04 NOTE — Assessment & Plan Note (Signed)
Swab taken and is negative for Strep throat

## 2023-01-04 NOTE — Assessment & Plan Note (Signed)
Controlled, no change in medication DASH diet and commitment to daily physical activity for a minimum of 30 minutes discussed and encouraged, as a part of hypertension management. The importance of attaining a healthy weight is also discussed.     01/01/2023    9:54 AM 10/29/2022    8:58 AM 10/14/2022    1:03 PM 06/13/2022   10:09 AM 04/23/2022    9:07 AM 03/19/2022   11:03 AM 03/19/2022   10:45 AM  BP/Weight  Systolic BP 130 119 136 129 132 136 119  Diastolic BP 80 74 84 82 80 78 76  Wt. (Lbs) 192.08 188.08 190 190 192.8  191.4  BMI 36.29 kg/m2 35.54 kg/m2 35.9 kg/m2 35.9 kg/m2 36.43 kg/m2  36.16 kg/m2

## 2023-01-04 NOTE — Progress Notes (Signed)
   Desiree [name]     MRN: [id number]      DOB: Jan 09, 1946  Chief Complaint  Patient presents with   Follow-up    Cough sore throat since Monday  covid negative    HPI Desiree [name] is here wit 4 day h/o cough and sore throat , has family gathering this weekend and is concerned that she not spread in fectiuon to family members, young and immunocompromised Has a negative home test for covid Has been using multiple OTC meds with no improvement ROS . Denies chest pains, palpitations and leg swelling Denies abdominal pain, nausea, vomiting,diarrhea or constipation.   Denies dysuria, frequency, hesitancy or incontinence. Denies joint pain, swelling and limitation in mobility. Denies headaches, seizures, numbness, or tingling. Denies depression, anxiety or insomnia. Denies skin break down or rash.   PE  BP 130/80 (BP Location: Right Arm, Patient Position: Sitting, Cuff Size: Large)   Pulse 79   Ht 5\' 1"  (1.549 m)   Wt 192 lb 1.3 oz (87.1 kg)   SpO2 96%   BMI 36.29 kg/m   Patient alert and oriented and in no cardiopulmonary distress.  HEENT: No facial asymmetry, EOMI,     Neck supple .Oropharynx no eryhtema or exudate. No cervical adenopathy  Chest: Clear to auscultation bilaterally.  CVS: S1, S2 no murmurs, no S3.Regular rate.  ABD: Soft non tender.   Ext: No edema  [id number]  ROM spine,  adequate in shoulders, hips and knees.  Skin: Intact, no ulcerations or rash noted.  Psych: Good eye contact, normal affect. Memory intact not anxious or depressed appearing.  CNS: CN 2-12 intact, power,  normal throughout.no focal deficits noted.   Assessment & Plan  Sore throat Swab taken and is negative for Strep throat  Acute cough DG CXR , normal clear lung fields Z pack pescribed  Allergic rhinitis Currently uncontrolled , commit to daily singulair and increase to twice daily OTC allergy med  Essential hypertension Controlled, no change in medication DASH diet and  commitment to daily physical activity for a minimum of 30 minutes discussed and encouraged, as a part of hypertension management. The importance of attaining a healthy weight is also discussed.     01/01/2023    9:54 AM 10/29/2022    8:58 AM 10/14/2022    1:03 PM 06/13/2022   10:09 AM 04/23/2022    9:07 AM 03/19/2022   11:03 AM 03/19/2022   10:45 AM  BP/Weight  Systolic BP 130 119 136 129 132 136 119  Diastolic BP 80 74 84 82 80 78 76  Wt. (Lbs) 192.08 188.08 190 190 192.8  191.4  BMI 36.29 kg/m2 35.54 kg/m2 35.9 kg/m2 35.9 kg/m2 36.43 kg/m2  36.16 kg/m2

## 2023-01-04 NOTE — Assessment & Plan Note (Signed)
Currently uncontrolled , commit to daily singulair and increase to twice daily OTC allergy med

## 2023-01-12 ENCOUNTER — Other Ambulatory Visit: Payer: Self-pay | Admitting: Family Medicine

## 2023-02-24 ENCOUNTER — Other Ambulatory Visit: Payer: Self-pay | Admitting: Family Medicine

## 2023-03-12 DIAGNOSIS — N189 Chronic kidney disease, unspecified: Secondary | ICD-10-CM | POA: Diagnosis not present

## 2023-03-12 DIAGNOSIS — E559 Vitamin D deficiency, unspecified: Secondary | ICD-10-CM | POA: Diagnosis not present

## 2023-03-13 ENCOUNTER — Ambulatory Visit (HOSPITAL_COMMUNITY)
Admission: RE | Admit: 2023-03-13 | Discharge: 2023-03-13 | Disposition: A | Discharge status: Home / Self Care | Payer: PPO | Source: Ambulatory Visit | Attending: Family Medicine | Admitting: Family Medicine

## 2023-03-13 ENCOUNTER — Encounter (HOSPITAL_COMMUNITY): Payer: Self-pay

## 2023-03-13 DIAGNOSIS — Z1231 Encounter for screening mammogram for malignant neoplasm of breast: Secondary | ICD-10-CM | POA: Diagnosis not present

## 2023-03-17 DIAGNOSIS — N1832 Chronic kidney disease, stage 3b: Secondary | ICD-10-CM | POA: Diagnosis not present

## 2023-03-17 DIAGNOSIS — I129 Hypertensive chronic kidney disease with stage 1 through stage 4 chronic kidney disease, or unspecified chronic kidney disease: Secondary | ICD-10-CM | POA: Diagnosis not present

## 2023-03-17 DIAGNOSIS — E559 Vitamin D deficiency, unspecified: Secondary | ICD-10-CM | POA: Diagnosis not present

## 2023-03-17 DIAGNOSIS — D638 Anemia in other chronic diseases classified elsewhere: Secondary | ICD-10-CM | POA: Diagnosis not present

## 2023-03-27 ENCOUNTER — Telehealth: Payer: Self-pay | Admitting: Family Medicine

## 2023-03-27 NOTE — Telephone Encounter (Signed)
Patient calling said she took the last of her Losartan today and is needing it refilled. Please advise Thank You

## 2023-03-30 NOTE — Telephone Encounter (Signed)
Losartan is listed as historical- is she supposed to be on this and ok to refill?

## 2023-04-01 NOTE — Telephone Encounter (Signed)
I spoke with pt and  she already has the med refiled from original prescriber

## 2023-04-03 ENCOUNTER — Other Ambulatory Visit: Payer: Self-pay | Admitting: Family Medicine

## 2023-04-13 ENCOUNTER — Other Ambulatory Visit: Payer: Self-pay | Admitting: Cardiovascular Disease

## 2023-05-06 ENCOUNTER — Encounter: Payer: Self-pay | Admitting: Family Medicine

## 2023-05-06 ENCOUNTER — Ambulatory Visit (INDEPENDENT_AMBULATORY_CARE_PROVIDER_SITE_OTHER): Payer: PPO | Admitting: Family Medicine

## 2023-05-06 VITALS — BP 134/82 | HR 65 | Ht 61.0 in | Wt 186.0 lb

## 2023-05-06 DIAGNOSIS — N1832 Chronic kidney disease, stage 3b: Secondary | ICD-10-CM | POA: Diagnosis not present

## 2023-05-06 DIAGNOSIS — G8929 Other chronic pain: Secondary | ICD-10-CM

## 2023-05-06 DIAGNOSIS — E559 Vitamin D deficiency, unspecified: Secondary | ICD-10-CM | POA: Diagnosis not present

## 2023-05-06 DIAGNOSIS — M25512 Pain in left shoulder: Secondary | ICD-10-CM

## 2023-05-06 DIAGNOSIS — R21 Rash and other nonspecific skin eruption: Secondary | ICD-10-CM

## 2023-05-06 DIAGNOSIS — E785 Hyperlipidemia, unspecified: Secondary | ICD-10-CM

## 2023-05-06 DIAGNOSIS — Z23 Encounter for immunization: Secondary | ICD-10-CM

## 2023-05-06 DIAGNOSIS — J309 Allergic rhinitis, unspecified: Secondary | ICD-10-CM | POA: Diagnosis not present

## 2023-05-06 DIAGNOSIS — I10 Essential (primary) hypertension: Secondary | ICD-10-CM

## 2023-05-06 MED ORDER — MONTELUKAST SODIUM 10 MG PO TABS: 10.0000 mg | ORAL_TABLET | Freq: Every day | ORAL | 1 refills | Status: DC

## 2023-05-06 MED ORDER — POTASSIUM CHLORIDE ER 10 MEQ PO TBCR: 10.0000 meq | EXTENDED_RELEASE_TABLET | Freq: Every day | ORAL | 1 refills | Status: DC

## 2023-05-06 MED ORDER — LORATADINE 10 MG PO TABS: ORAL_TABLET | ORAL | 0 refills | Status: DC

## 2023-05-06 NOTE — Patient Instructions (Addendum)
Annual exam in November or after, call if you need me sooner  Fasting lipid and hepatic panel end October  Flu vaccine today  RSV and covid vaccines  at your pharmacy  Claritin/ generic loratidine  up to twice daily is added for use if needed  Gabapentin removed , since of no benefit,  It is important that you exercise regularly at least 30 minutes 5 times a week. If you develop chest pain, have severe difficulty breathing, or feel very tired, stop exercising immediately and seek medical attention   Think about what you will eat, plan ahead. Choose " clean, green, fresh or frozen" over canned, processed or packaged foods which are more sugary, salty and fatty. 70 to 75% of food eaten should be vegetables and fruit. Three meals at set times with snacks allowed between meals, but they must be fruit or vegetables. Aim to eat over a 12 hour period , example 7 am to 7 pm, and STOP after  your last meal of the day. Drink water,generally about 64 ounces per day, no other drink is as healthy. Fruit juice is best enjoyed in a healthy way, by EATING the fruit.   Thanks for choosing [facility], we consider it a privelige to serve you.

## 2023-05-06 NOTE — Assessment & Plan Note (Signed)
Followed by Nephrology 

## 2023-05-06 NOTE — Assessment & Plan Note (Signed)
Controlled, no change in medication DASH diet and commitment to daily physical activity for a minimum of 30 minutes discussed and encouraged, as a part of hypertension management. The importance of attaining a healthy weight is also discussed.     05/06/2023    9:35 AM 01/01/2023    9:54 AM 10/29/2022    8:58 AM 10/14/2022    1:03 PM 06/13/2022   10:09 AM 04/23/2022    9:07 AM 03/19/2022   11:03 AM  BP/Weight  Systolic BP 134 130 119 136 129 132 136  Diastolic BP 82 80 74 84 82 80 78  Wt. (Lbs) 186.04 192.08 188.08 190 190 192.8   BMI 35.15 kg/m2 36.29 kg/m2 35.54 kg/m2 35.9 kg/m2 35.9 kg/m2 36.43 kg/m2

## 2023-05-06 NOTE — Assessment & Plan Note (Signed)
Fluctuates, responds to steroid and antifungal, recurrent

## 2023-05-06 NOTE — Assessment & Plan Note (Signed)
Recurrent pain , uses tylenol as needed, no ned for Ortho at this time

## 2023-05-06 NOTE — Assessment & Plan Note (Signed)
Uncontrolled , add Claritin one to two daily as needed, continue daily Singulair

## 2023-05-06 NOTE — Assessment & Plan Note (Signed)
Controlled on current regime 

## 2023-05-06 NOTE — Assessment & Plan Note (Signed)
Hyperlipidemia:Low fat diet discussed and encouraged.   Lipid Panel  Lab Results  Component Value Date   CHOL 161 01/01/2023   HDL 82 01/01/2023   LDLCALC 69 01/01/2023   TRIG 48 01/01/2023   CHOLHDL 2.0 01/01/2023     Updated lab needed at/ before next visit.

## 2023-05-06 NOTE — Assessment & Plan Note (Addendum)
Obesity linked with arthritis, hypertension and hyperlipidemia Slight improvement  Patient re-educated about  the importance of commitment to a  minimum of 150 minutes of exercise per week as able.  The importance of healthy food choices with portion control discussed, as well as eating regularly and within a 12 hour window most days. The need to choose "clean , green" food 50 to 75% of the time is discussed, as well as to make water the primary drink and set a goal of 64 ounces water daily.       05/06/2023    9:35 AM 01/01/2023    9:54 AM 10/29/2022    8:58 AM  Weight /BMI  Weight 186 lb 0.6 oz 192 lb 1.3 oz 188 lb 1.3 oz  Height 5\' 1"  (1.549 m) 5\' 1"  (1.549 m) 5\' 1"  (1.549 m)  BMI 35.15 kg/m2 36.29 kg/m2 35.54 kg/m2

## 2023-05-06 NOTE — Progress Notes (Signed)
Desiree [name]     MRN: [id number]      DOB: [birth year]  Chief Complaint  Patient presents with   Follow-up    Follow up    HPI Desiree [name] is here for follow up and re-evaluation of chronic medical conditions, medication management and review of any available recent lab and radiology data.  Preventive health is updated, specifically  Cancer screening and Immunization.   Questions or concerns regarding consultations or procedures which the PT has had in the interim are  addressed. The PT denies any adverse reactions to current medications since the last visit.  There are no new concerns.  There are no specific complaints   ROS Denies recent fever or chills. Denies sinus pressure, nasal congestion, ear pain or sore throat.reports increased post nasal drainage as though something is stuck in her throat for past several weeks Denies chest congestion, productive cough or wheezing. Denies chest pains, palpitations and leg swelling Denies abdominal pain, nausea, vomiting,diarrhea or constipation.   Denies dysuria, frequency, hesitancy or incontinence. Generalized joint pain,  and no limitation in mobility.Left shoulder is the most affected Denies headaches, seizures, numbness, or tingling. Denies depression, anxiety or insomnia. Denies skin break down or rash.   PE  BP 134/82 (BP Location: Right Arm, Patient Position: Sitting, Cuff Size: Large)   Pulse 65   Ht 5\' 1"  (1.549 m)   Wt 186 lb 0.6 oz (84.4 kg)   SpO2 97%   BMI 35.15 kg/m   Patient alert and oriented and in no cardiopulmonary distress.  HEENT: No facial asymmetry, EOMI,     Neck supple .  Chest: Clear to auscultation bilaterally.  CVS: S1, S2 no murmurs, no S3.Regular rate.  ABD: Soft non tender.   Ext: No edema  MS: Adequate ROM spine, , hips and knees.Slightly decreased in left shoulder  Skin: Intact, no ulcerations or rash noted.  Psych: Good eye contact, normal affect. Memory intact not anxious or  depressed appearing.  CNS: CN 2-12 intact, power,  normal throughout.no focal deficits noted.   Assessment & Plan  Allergic rhinitis Uncontrolled , add Claritin one to two daily as needed, continue daily Singulair  Left shoulder pain Recurrent pain , uses tylenol as needed, no ned for Ortho at this time  Essential hypertension Controlled, no change in medication DASH diet and commitment to daily physical activity for a minimum of 30 minutes discussed and encouraged, as a part of hypertension management. The importance of attaining a healthy weight is also discussed.     05/06/2023    9:35 AM 01/01/2023    9:54 AM 10/29/2022    8:58 AM 10/14/2022    1:03 PM 06/13/2022   10:09 AM 04/23/2022    9:07 AM 03/19/2022   11:03 AM  BP/Weight  Systolic BP 134 130 119 136 129 132 136  Diastolic BP 82 80 74 84 82 80 78  Wt. (Lbs) 186.04 192.08 188.08 190 190 192.8   BMI 35.15 kg/m2 36.29 kg/m2 35.54 kg/m2 35.9 kg/m2 35.9 kg/m2 36.43 kg/m2        Hyperlipidemia Hyperlipidemia:Low fat diet discussed and encouraged.   Lipid Panel  Lab Results  Component Value Date   CHOL 161 01/01/2023   HDL 82 01/01/2023   LDLCALC 69 01/01/2023   TRIG 48 01/01/2023   CHOLHDL 2.0 01/01/2023     Updated lab needed at/ before next visit.   Morbid obesity (HCC) Obesity linked with arthritis, hypertension and hyperlipidemia Slight improvement  Patient  re-educated about  the importance of commitment to a  minimum of 150 minutes of exercise per week as able.  The importance of healthy food choices with portion control discussed, as well as eating regularly and within a 12 hour window most days. The need to choose "clean , green" food 50 to 75% of the time is discussed, as well as to make water the primary drink and set a goal of 64 ounces water daily.       05/06/2023    9:35 AM 01/01/2023    9:54 AM 10/29/2022    8:58 AM  Weight /BMI  Weight 186 lb 0.6 oz 192 lb 1.3 oz 188 lb 1.3 oz  Height 5'  1" (1.549 m) 5\' 1"  (1.549 m) 5\' 1"  (1.549 m)  BMI 35.15 kg/m2 36.29 kg/m2 35.54 kg/m2      Stage 3b chronic kidney disease (CKD) (HCC) Followed by Nephrology  Rash and nonspecific skin eruption Fluctuates, responds to steroid and antifungal, recurrent  Vitamin D deficiency Controlled on current regime

## 2023-05-28 ENCOUNTER — Other Ambulatory Visit: Payer: Self-pay | Admitting: Family Medicine

## 2023-06-03 DIAGNOSIS — H35362 Drusen (degenerative) of macula, left eye: Secondary | ICD-10-CM | POA: Diagnosis not present

## 2023-06-03 DIAGNOSIS — H35032 Hypertensive retinopathy, left eye: Secondary | ICD-10-CM | POA: Diagnosis not present

## 2023-06-03 DIAGNOSIS — H04123 Dry eye syndrome of bilateral lacrimal glands: Secondary | ICD-10-CM | POA: Diagnosis not present

## 2023-06-03 DIAGNOSIS — H524 Presbyopia: Secondary | ICD-10-CM | POA: Diagnosis not present

## 2023-06-03 DIAGNOSIS — H40033 Anatomical narrow angle, bilateral: Secondary | ICD-10-CM | POA: Diagnosis not present

## 2023-06-03 DIAGNOSIS — H25813 Combined forms of age-related cataract, bilateral: Secondary | ICD-10-CM | POA: Diagnosis not present

## 2023-06-11 ENCOUNTER — Ambulatory Visit (INDEPENDENT_AMBULATORY_CARE_PROVIDER_SITE_OTHER): Payer: PPO | Admitting: Family Medicine

## 2023-06-11 ENCOUNTER — Encounter: Payer: Self-pay | Admitting: Family Medicine

## 2023-06-11 VITALS — BP 147/83 | HR 86 | Resp 16 | Ht 61.0 in | Wt 187.1 lb

## 2023-06-11 DIAGNOSIS — R7989 Other specified abnormal findings of blood chemistry: Secondary | ICD-10-CM

## 2023-06-11 DIAGNOSIS — R079 Chest pain, unspecified: Secondary | ICD-10-CM | POA: Diagnosis not present

## 2023-06-11 DIAGNOSIS — R0789 Other chest pain: Secondary | ICD-10-CM

## 2023-06-11 MED ORDER — TIZANIDINE HCL 2 MG PO CAPS: ORAL_CAPSULE | ORAL | 0 refills | Status: DC

## 2023-06-11 MED ORDER — PREDNISONE 10 MG PO TABS: 10.0000 mg | ORAL_TABLET | Freq: Two times a day (BID) | ORAL | 0 refills | Status: DC

## 2023-06-11 MED ORDER — METHYLPREDNISOLONE ACETATE 80 MG/ML IJ SUSP
80.0000 mg | Freq: Once | INTRAMUSCULAR | Status: AC
  Administered 2023-06-11: 80 mg via INTRAMUSCULAR

## 2023-06-11 NOTE — Patient Instructions (Addendum)
F/U in 3 weeks, re evaluate chest pain , call if you need me sooner  Chest scan needed , we will give you appointment information  EKG In office  today  Troponin and chem7 and EGFR and d dimer   Depo medrol 80 mg IM in office, and prednisone and muscle relaxant are sent to your pharmacy  If your symptoms get worse , go to the ED please  Thanks for choosing [facility], we consider it a privelige to serve you.

## 2023-06-12 ENCOUNTER — Encounter (HOSPITAL_COMMUNITY): Payer: Self-pay | Admitting: *Deleted

## 2023-06-12 ENCOUNTER — Emergency Department (HOSPITAL_COMMUNITY)
Admission: EM | Admit: 2023-06-12 | Discharge: 2023-06-12 | Disposition: A | Discharge status: Home / Self Care | Payer: PPO | Attending: Emergency Medicine | Admitting: Emergency Medicine

## 2023-06-12 ENCOUNTER — Other Ambulatory Visit: Payer: Self-pay

## 2023-06-12 DIAGNOSIS — R001 Bradycardia, unspecified: Secondary | ICD-10-CM | POA: Diagnosis not present

## 2023-06-12 DIAGNOSIS — N189 Chronic kidney disease, unspecified: Secondary | ICD-10-CM | POA: Diagnosis not present

## 2023-06-12 DIAGNOSIS — Z853 Personal history of malignant neoplasm of breast: Secondary | ICD-10-CM | POA: Diagnosis not present

## 2023-06-12 DIAGNOSIS — Z7982 Long term (current) use of aspirin: Secondary | ICD-10-CM | POA: Insufficient documentation

## 2023-06-12 DIAGNOSIS — R109 Unspecified abdominal pain: Secondary | ICD-10-CM | POA: Diagnosis not present

## 2023-06-12 DIAGNOSIS — M546 Pain in thoracic spine: Secondary | ICD-10-CM | POA: Diagnosis not present

## 2023-06-12 DIAGNOSIS — Z79899 Other long term (current) drug therapy: Secondary | ICD-10-CM | POA: Diagnosis not present

## 2023-06-12 DIAGNOSIS — I129 Hypertensive chronic kidney disease with stage 1 through stage 4 chronic kidney disease, or unspecified chronic kidney disease: Secondary | ICD-10-CM | POA: Insufficient documentation

## 2023-06-12 LAB — BMP8+EGFR
BUN/Creatinine Ratio: 20 (ref 12–28)
BUN: 30 mg/dL — ABNORMAL HIGH (ref 8–27)
CO2: 24 mmol/L (ref 20–29)
Calcium: 10 mg/dL (ref 8.7–10.3)
Chloride: 103 mmol/L (ref 96–106)
Creatinine, Ser: 1.53 mg/dL — ABNORMAL HIGH (ref 0.57–1.00)
Glucose: 92 mg/dL (ref 70–99)
Potassium: 3.9 mmol/L (ref 3.5–5.2)
Sodium: 143 mmol/L (ref 134–144)
eGFR: 35 mL/min/{1.73_m2} — ABNORMAL LOW (ref >59)

## 2023-06-12 LAB — URINALYSIS, ROUTINE W REFLEX MICROSCOPIC
Bilirubin Urine: NEGATIVE
Glucose, UA: NEGATIVE mg/dL
Ketones, ur: NEGATIVE mg/dL
Leukocytes,Ua: NEGATIVE
Nitrite: NEGATIVE
Protein, ur: NEGATIVE mg/dL
Specific Gravity, Urine: 1.005 (ref 1.005–1.030)
pH: 5 (ref 5.0–8.0)

## 2023-06-12 LAB — TROPONIN I (HIGH SENSITIVITY): Troponin I (High Sensitivity): 6 ng/L (ref <18)

## 2023-06-12 LAB — D-DIMER, QUANTITATIVE: D-DIMER: 0.56 mg{FEU}/L — ABNORMAL HIGH (ref 0.00–0.49)

## 2023-06-12 LAB — TROPONIN T: Troponin T (Highly Sensitive): 26 ng/L (ref 0–14)

## 2023-06-12 NOTE — ED Triage Notes (Signed)
Pt instructed to due to abnormal labs.  Pt with left flank pain x 1 week.  Pt denies any SOB or CP. D-dimer elevated from yesterday's labs.

## 2023-06-12 NOTE — Discharge Instructions (Addendum)
Pleasure taking care of you today.  Your D-dimer test that was elevated yesterday is normal for your age so we do not need to do any further testing for this, we tested your troponin again today and this was normal.  There are no signs of blood in your urine or infection.  Since your back pain is better we will probably follow close with your primary care doctor and come back to the ER for new or worsening symptoms.

## 2023-06-12 NOTE — ED Provider Notes (Signed)
[place] EMERGENCY DEPARTMENT AT [facility] Provider Note   CSN: [id number] Arrival date & time: 06/12/23  1638     History  No chief complaint on file.   Desiree [name] is a 77 y.o. female.  She presents to the ER today receiving phone call from her outpatient PCP due to abnormal labs.  She states she was seen yesterday by her PCP because she was having the pain in her left flank area and had some swelling.  She states that the pain is better now and the swelling is gone.  He was seen by her PCP for this and had labs ordered including a D-dimer and troponin. HPI     Home Medications Prior to Admission medications   Medication Sig Start Date End Date Taking? Authorizing Provider  acetaminophen (TYLENOL) 500 MG tablet Take one to  two tablets by mouth at night for pain 02/20/20   Kerri Perches, MD  Ascorbic Acid (VITAMIN C) 1000 MG tablet Take 1,000 mg by mouth daily.    [provider]  aspirin EC 325 MG tablet Take 1 tablet (325 mg total) by mouth daily. 02/21/13   Kerri Perches, MD  betamethasone dipropionate 0.05 % cream Apply sparingly two times daily for 10 days, then as needed, to affected areas( hairline, neck) 06/13/22   Kerri Perches, MD  carvedilol (COREG) 3.125 MG tablet TAKE 1 TABLET(3.125 MG) BY MOUTH TWICE DAILY WITH A MEAL 04/13/23   Croitoru, Mihai, MD  chlorthalidone (HYGROTON) 50 MG tablet Take 1 tablet by mouth once daily 04/03/23   Kerri Perches, MD  Cholecalciferol (VITAMIN D) 50 MCG (2000 UT) tablet Take 2,000 Units by mouth daily.     [provider]  cycloSPORINE (RESTASIS) 0.05 % ophthalmic emulsion 1 drop 2 (two) times daily.    [provider]  Ferrous Sulfate (SLOW FE PO) Take 1 capsule by mouth every other day.    [provider]  loratadine (CLARITIN) 10 MG tablet Take one tablet by mouth  one to two times daily as needed for uncontrolled allergies 05/06/23   Kerri Perches, MD   losartan (COZAAR) 100 MG tablet Take 50 mg by mouth daily. 03/20/22 05/06/23  [provider]  lovastatin (MEVACOR) 40 MG tablet Take 1 tablet by mouth once daily 05/28/23   Kerri Perches, MD  montelukast (SINGULAIR) 10 MG tablet Take 1 tablet (10 mg total) by mouth at bedtime. 05/06/23   Kerri Perches, MD  Multiple Vitamin (MULTIVITAMIN) tablet Take 1 tablet by mouth daily. One a day woman's    [provider]  potassium chloride (KLOR-CON) 10 MEQ tablet Take 1 tablet (10 mEq total) by mouth daily. 05/06/23   Kerri Perches, MD  predniSONE (DELTASONE) 10 MG tablet Take 1 tablet (10 mg total) by mouth 2 (two) times daily with a meal. 06/11/23   Kerri Perches, MD  terbinafine (LAMISIL) 1 % cream Apply twice daily to rash on scalp for 1 week, then , as needed 06/13/22   Kerri Perches, MD  tizanidine (ZANAFLEX) 2 MG capsule Take one capsule by moth at bedtime, afor 5 days , then as needed 06/11/23   Kerri Perches, MD  zinc gluconate 50 MG tablet Take 50 mg by mouth daily.    [provider]  amLODipine (NORVASC) 10 MG tablet Take 1 tablet (10 mg total) by mouth daily. 07/14/11 11/11/11  Kerri Perches, MD  Allergies    Codeine and Quinolones    Review of Systems   Review of Systems  Physical Exam Updated Vital Signs BP 135/68 (BP Location: Right Arm)   Pulse 74   Temp 98.8 F (37.1 C) (Oral)   Resp 17   Ht 5\' 1"  (1.549 m)   Wt 84.9 kg   SpO2 100%   BMI 35.37 kg/m  Physical Exam Vitals and nursing note reviewed.  Constitutional:      General: She is not in acute distress.    Appearance: She is well-developed.  HENT:     Head: Normocephalic and atraumatic.     Mouth/Throat:     Mouth: Mucous membranes are moist.  Eyes:     Conjunctiva/sclera: Conjunctivae normal.  Cardiovascular:     Rate and Rhythm: Normal rate and regular rhythm.     Heart sounds: No murmur heard. Pulmonary:     Effort: Pulmonary effort is  normal. No respiratory distress.     Breath sounds: Normal breath sounds.  Abdominal:     Palpations: Abdomen is soft.     Tenderness: There is no abdominal tenderness.  Musculoskeletal:        General: No swelling.     Cervical back: Neck supple.  Skin:    General: Skin is warm and dry.     Capillary Refill: Capillary refill takes less than 2 seconds.  Neurological:     General: No focal deficit present.     Mental Status: She is alert and oriented to person, place, and time.  Psychiatric:        Mood and Affect: Mood normal.     ED Results / Procedures / Treatments   Labs (all labs ordered are listed, but only abnormal results are displayed) Labs Reviewed  URINALYSIS, ROUTINE W REFLEX MICROSCOPIC  TROPONIN I (HIGH SENSITIVITY)    EKG None  Radiology No results found.  Procedures Procedures    Medications Ordered in ED Medications - No data to display  ED Course/ Medical Decision Making/ A&P                                 Medical Decision Making This patient presents to the ED for concern of abnormal labs, this involves an extensive number of treatment options, and is a complaint that carries with it a high risk of complications and morbidity.  The differential diagnosis includes also strain, contusion, ACS, PE, other   Co morbidities that complicate the patient evaluation :  HTN, CKD, breast cancer, stable thoracic aortic aneurysm at 4.2 cm follow-up with vascular surgery   Additional history obtained:  Additional history obtained from EMR External records from outside source obtained and reviewed including notes, vascular surgery consult, labs   Lab Tests:  I Ordered, and personally interpreted labs.  The pertinent results include: Was normal at 6, urinalysis reassuring just shows rare bacteria, specifically no blood      Cardiac Monitoring: / EKG:  The patient was maintained on a cardiac monitor.  I personally viewed and interpreted the cardiac  monitored which showed an underlying rhythm of: Normal sinus rhythm    Problem List / ED Course / Critical interventions / Medication management  Patient presented for abnormal labs yesterday was sent in by PCP for D-dimer of 0.56 which is normal when adjusting for patient's age, also had troponin of 26.  This was a troponin T, I repeated troponin today  but got troponin I which is 6.  EKG is normal.  Patient never had any chest pain she had been having days worth of left mid flank pain and states it was swollen but the pain is now better and swelling is now gone.  Denies injury or trauma denies fever or chills, no cough, no shortness of breath, no pleurisy.  No urinary symptoms.  Normal EKG and troponin today I do not feel patient needs any further workup positionally as she is asymptomatic.  Her symptoms seem to be more located to the left mid back area associated with swelling there are no skin changes or rashes, no neurologic symptoms.  Advised on follow-up with her primary care doctor and return precautions.  I have reviewed the patients home medicines and have made adjustments as needed    Amount and/or Complexity of Data Reviewed Labs: ordered.           Final Clinical Impression(s) / ED Diagnoses Final diagnoses:  None    Rx / DC Orders ED Discharge Orders     None         Josem Kaufmann 06/12/23 1925    Eber Hong, MD 06/13/23 1145

## 2023-06-15 ENCOUNTER — Encounter: Payer: Self-pay | Admitting: Family Medicine

## 2023-06-15 DIAGNOSIS — R0789 Other chest pain: Secondary | ICD-10-CM | POA: Insufficient documentation

## 2023-06-15 DIAGNOSIS — R7989 Other specified abnormal findings of blood chemistry: Secondary | ICD-10-CM | POA: Insufficient documentation

## 2023-06-15 DIAGNOSIS — R079 Chest pain, unspecified: Secondary | ICD-10-CM | POA: Insufficient documentation

## 2023-06-15 NOTE — Progress Notes (Signed)
Desiree [name]     MRN: [id number]      DOB: June 10, 1946  Chief Complaint  Patient presents with   shoulder blade pain     Pain in mid upper back and over the left shoulder blade x 1 week. Pain is constant and worse with movement. Last night was the first night she has been able to get any rest     HPI Desiree [name] is here with a 1 week h/o left shoulder blade pain aggravated by movement but awakens her at times prevents her from sleeping and is as high as a 10, n o specific trigger, no aggravating or relieving factors Denies hemoptysis or dyspnea Denies PND, leg swelling or new diaphoresis with activity  ROS Denies recent fever or chills. Denies sinus pressure, nasal congestion, ear pain or sore throat.  Denies abdominal pain, nausea, vomiting,diarrhea or constipation.   Denies dysuria, frequency, hesitancy or incontinence. . Denies headaches, seizures, numbness, or tingling. Denies depression, anxiety. Denies skin break down or rash.   PE  BP (!) 147/83   Pulse 86   Resp 16   Ht 5\' 1"  (1.549 m)   Wt 187 lb 1.9 oz (84.9 kg)   SpO2 97%   BMI 35.36 kg/m   Patient alert and oriented and in no cardiopulmonary distress.  HEENT: No facial asymmetry, EOMI,     Neck supple .  Chest: Clear to auscultation bilaterally.tender over posterior left flank with swelling  CVS: S1, S2 no murmurs, no S3.Regular rate. EKG: Sinus brady, no ischemia ABD: Soft non tender.   Ext: No edema  MS: Adequate ROM spine, shoulders, .  Skin: Intact, no ulcerations or rash noted.  Psych: Good eye contact, normal affect. Memory intact not anxious or depressed appearing.  CNS: CN 2-12 intact, power,  normal throughout.no focal deficits noted.   Assessment & Plan  Left-sided chest pain 1 week history, severity is concerning, Troponin, d dimer, cmp and EKG as well as chest scan [facility] musculoskeletal but not only limited to movement   Elevated troponin I level Markedly elevated troponin  level, will need to go to ED for further evaluation, resulted the following day, stated she felt better however still needs evaluation  Other chest pain Chest pain  worse with movement of upper body but also occurs at rest, depo medrol IM in office and short course of prednisone, presumed to be arthritic, also muscle relaxant prescribed Chest scam ordered and d dimer checked n within range based on  age

## 2023-06-15 NOTE — Assessment & Plan Note (Addendum)
Chest pain  worse with movement of upper body but also occurs at rest, depo medrol IM in office and short course of prednisone, presumed to be arthritic, also muscle relaxant prescribed Chest scam ordered and d dimer checked n within range based on  age

## 2023-06-15 NOTE — Assessment & Plan Note (Signed)
Markedly elevated troponin level, will need to go to ED for further evaluation, resulted the following day, stated she felt better however still needs evaluation

## 2023-06-15 NOTE — Assessment & Plan Note (Signed)
1 week history, severity is concerning, Troponin, d dimer, cmp and EKG as well as chest scan [facility] musculoskeletal but not only limited to movement

## 2023-06-16 ENCOUNTER — Telehealth: Payer: Self-pay | Admitting: Family Medicine

## 2023-06-16 NOTE — Telephone Encounter (Signed)
Patient called asking did the ER get in touch with Dr Lodema Hong Friday visit 10.18.2024 about her visit.

## 2023-06-18 ENCOUNTER — Ambulatory Visit (HOSPITAL_COMMUNITY)
Admission: RE | Admit: 2023-06-18 | Discharge: 2023-06-18 | Disposition: A | Discharge status: Home / Self Care | Payer: PPO | Source: Ambulatory Visit | Attending: Family Medicine | Admitting: Family Medicine

## 2023-06-18 DIAGNOSIS — I7121 Aneurysm of the ascending aorta, without rupture: Secondary | ICD-10-CM | POA: Diagnosis not present

## 2023-06-18 DIAGNOSIS — R590 Localized enlarged lymph nodes: Secondary | ICD-10-CM | POA: Diagnosis not present

## 2023-06-18 DIAGNOSIS — R0789 Other chest pain: Secondary | ICD-10-CM | POA: Insufficient documentation

## 2023-06-18 DIAGNOSIS — R079 Chest pain, unspecified: Secondary | ICD-10-CM | POA: Diagnosis not present

## 2023-07-02 DIAGNOSIS — I712 Thoracic aortic aneurysm, without rupture, unspecified: Secondary | ICD-10-CM | POA: Diagnosis not present

## 2023-07-02 DIAGNOSIS — I129 Hypertensive chronic kidney disease with stage 1 through stage 4 chronic kidney disease, or unspecified chronic kidney disease: Secondary | ICD-10-CM | POA: Diagnosis not present

## 2023-07-02 DIAGNOSIS — D638 Anemia in other chronic diseases classified elsewhere: Secondary | ICD-10-CM | POA: Diagnosis not present

## 2023-07-02 DIAGNOSIS — D649 Anemia, unspecified: Secondary | ICD-10-CM | POA: Diagnosis not present

## 2023-07-02 DIAGNOSIS — N1832 Chronic kidney disease, stage 3b: Secondary | ICD-10-CM | POA: Diagnosis not present

## 2023-07-02 DIAGNOSIS — N189 Chronic kidney disease, unspecified: Secondary | ICD-10-CM | POA: Diagnosis not present

## 2023-07-06 ENCOUNTER — Other Ambulatory Visit: Payer: Self-pay | Admitting: Family Medicine

## 2023-07-08 DIAGNOSIS — I129 Hypertensive chronic kidney disease with stage 1 through stage 4 chronic kidney disease, or unspecified chronic kidney disease: Secondary | ICD-10-CM | POA: Diagnosis not present

## 2023-07-08 DIAGNOSIS — I712 Thoracic aortic aneurysm, without rupture, unspecified: Secondary | ICD-10-CM | POA: Diagnosis not present

## 2023-07-08 DIAGNOSIS — N1832 Chronic kidney disease, stage 3b: Secondary | ICD-10-CM | POA: Diagnosis not present

## 2023-07-08 DIAGNOSIS — D638 Anemia in other chronic diseases classified elsewhere: Secondary | ICD-10-CM | POA: Diagnosis not present

## 2023-07-13 DIAGNOSIS — E785 Hyperlipidemia, unspecified: Secondary | ICD-10-CM | POA: Diagnosis not present

## 2023-07-13 DIAGNOSIS — I10 Essential (primary) hypertension: Secondary | ICD-10-CM | POA: Diagnosis not present

## 2023-07-14 LAB — HEPATIC FUNCTION PANEL
ALT: 13 [IU]/L (ref 0–32)
AST: 28 [IU]/L (ref 0–40)
Albumin: 3.9 g/dL (ref 3.8–4.8)
Alkaline Phosphatase: 93 [IU]/L (ref 44–121)
Bilirubin Total: 0.4 mg/dL (ref 0.0–1.2)
Bilirubin, Direct: 0.16 mg/dL (ref 0.00–0.40)
Total Protein: 6.5 g/dL (ref 6.0–8.5)

## 2023-07-14 LAB — LIPID PANEL
Chol/HDL Ratio: 1.9 {ratio} (ref 0.0–4.4)
Cholesterol, Total: 166 mg/dL (ref 100–199)
HDL: 89 mg/dL (ref >39)
LDL Chol Calc (NIH): 68 mg/dL (ref 0–99)
Triglycerides: 39 mg/dL (ref 0–149)
VLDL Cholesterol Cal: 9 mg/dL (ref 5–40)

## 2023-07-16 ENCOUNTER — Ambulatory Visit (INDEPENDENT_AMBULATORY_CARE_PROVIDER_SITE_OTHER): Payer: PPO | Admitting: Family Medicine

## 2023-07-16 ENCOUNTER — Encounter: Payer: Self-pay | Admitting: Family Medicine

## 2023-07-16 VITALS — BP 127/78 | HR 85 | Ht 61.0 in | Wt 188.0 lb

## 2023-07-16 DIAGNOSIS — E785 Hyperlipidemia, unspecified: Secondary | ICD-10-CM

## 2023-07-16 DIAGNOSIS — E559 Vitamin D deficiency, unspecified: Secondary | ICD-10-CM | POA: Diagnosis not present

## 2023-07-16 DIAGNOSIS — Z0001 Encounter for general adult medical examination with abnormal findings: Secondary | ICD-10-CM

## 2023-07-16 DIAGNOSIS — I10 Essential (primary) hypertension: Secondary | ICD-10-CM | POA: Diagnosis not present

## 2023-07-16 MED ORDER — LOSARTAN POTASSIUM 50 MG PO TABS: 50.0000 mg | ORAL_TABLET | Freq: Every day | ORAL | 3 refills | Status: DC

## 2023-07-16 MED ORDER — LOVASTATIN 40 MG PO TABS: 40.0000 mg | ORAL_TABLET | Freq: Every day | ORAL | 2 refills | Status: DC

## 2023-07-16 MED ORDER — LORATADINE 10 MG PO TABS: ORAL_TABLET | ORAL | 2 refills | Status: AC

## 2023-07-16 NOTE — Assessment & Plan Note (Signed)
Annual exam as documented. . Immunization and cancer screening needs are specifically addressed at this visit.  

## 2023-07-16 NOTE — Progress Notes (Signed)
    Desiree [name]     MRN: [id number]      DOB: 1946/05/23  Chief Complaint  Patient presents with   Annual Exam    CPE     HPI: Patient is in for annual physical exam. No other health concerns are expressed or addressed at the visit. Recent labs,  are reviewed. Immunization is reviewed , and  updated if needed.   PE: BP 127/78 (BP Location: Right Arm, Patient Position: Sitting, Cuff Size: Large)   Pulse 85   Ht 5\' 1"  (1.549 m)   Wt 188 lb 0.6 oz (85.3 kg)   SpO2 94%   BMI 35.53 kg/m   Pleasant  female, alert and oriented x 3, in no cardio-pulmonary distress. Afebrile. HEENT No facial trauma or asymetry. Sinuses non tender.  Extra occullar muscles intact.. External ears normal, . Neck: supple, no adenopathy,JVD or thyromegaly.No bruits.  Chest: Clear to ascultation bilaterally.No crackles or wheezes. Non tender to palpation  Cardiovascular system; Heart sounds normal,  S1 and  S2 ,no S3.  No murmur, or thrill.  Peripheral pulses normal.  Abdomen: Soft, non tender, no organomegaly or masses.    Musculoskeletal exam: Full ROM of spine, hips ,  and knees.Decreased in left shoulder No deformity ,swelling or crepitus noted. No muscle wasting or atrophy.   Neurologic: Cranial nerves 2 to 12 intact. Power, tone ,sensation normal throughout. No disturbance in gait. No tremor.  Skin: Intact, no ulceration, erythema , scaling or rash noted. Pigmentation normal throughout  Psych; Normal mood and affect. Judgement and concentration normal   Assessment & Plan:  Encounter for Medicare annual examination with abnormal findings Annual exam as documented. Immunization and cancer screening needs are specifically addressed at this visit.

## 2023-07-16 NOTE — Patient Instructions (Addendum)
F/u in  6 months, call if you need me sooner  Excellent labs   Wellness visit past due needs to be scheduled, pt states she was scheduled for yesterday at 8 am but got no call  Fasting lipid, cmp and eGFr, CBC, tSH, Vit d to be drawn 5 to 7 days before next appointment  RSV and covid vaccines at the pharmacy, please get them separately  Thanks for choosing [facility], we consider it a privelige to serve you.

## 2023-07-29 ENCOUNTER — Encounter: Payer: Self-pay | Admitting: Cardiovascular Disease

## 2023-07-29 ENCOUNTER — Ambulatory Visit: Payer: PPO | Attending: Cardiovascular Disease | Admitting: Cardiovascular Disease

## 2023-07-29 VITALS — BP 144/77 | HR 53 | Ht 61.5 in | Wt 190.8 lb

## 2023-07-29 DIAGNOSIS — I10 Essential (primary) hypertension: Secondary | ICD-10-CM

## 2023-07-29 DIAGNOSIS — N1832 Chronic kidney disease, stage 3b: Secondary | ICD-10-CM

## 2023-07-29 DIAGNOSIS — Q248 Other specified congenital malformations of heart: Secondary | ICD-10-CM

## 2023-07-29 DIAGNOSIS — I7121 Aneurysm of the ascending aorta, without rupture: Secondary | ICD-10-CM

## 2023-07-29 DIAGNOSIS — E7800 Pure hypercholesterolemia, unspecified: Secondary | ICD-10-CM | POA: Diagnosis not present

## 2023-07-29 NOTE — Progress Notes (Signed)
Cardiology Office Note   Date:  07/29/2023   ID:  Tanyell, Briscoe 02/22/1946, MRN [id number]  PCP:  Kerri Perches, MD  Cardiologist:  Thurmon Fair, MD Electrophysiologist:  None   Chief Complaint: Aortic aneurysm, HTN  History of Present Illness:    Desiree [name] is a 77 y.o. female with longstanding hypertension, moderate aneurysm of the ascending aorta (size increased over the last 12 months now most recently 4.6 cm), CKD stage III, hypercholesterolemia on statin without known coronary vascular disease, remote history of ischemic right hemispheric stroke without neurological deficits.  She generally been well.  She does have some mild exertional dyspnea, but is usually her knees slow her down before she becomes short of breath.  She has not had any chest pain.  She denies dizziness or syncope or palpitations.  Has not had lower extremity edema, orthopnea or PND.   She had had some atypical chest pain and underwent a CT angiogram 06/18/2023 to exclude pulmonary embolism which showed that her ascending aorta has dilated from 4.4 cm in February now up to 4.4 cm.  Until this year her aneurysm has been stable in size at about 4.2 cm for over 10 years.  She has been evaluated by Dr. Dorris Fetch in the CT surgery office and has routine follow-up there.  Her blood pressure is a little high today, but on 07/16/2023 it was completely normal at 127/78.  She is taking the maximal tolerated dose of carvedilol (heart rate is in the low 50s), chlorthalidone and losartan 50 mg once daily (the dose was decreased by her nephrologist, Dr. Wolfgang Phoenix last year).  She has a known dynamic systolic ejection murmur related to LV outflow tract obstruction, without the full phenotypic manifestations of hypertrophic cardiomyopathy.  On physical exam today her murmur is less prominent.   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--  S/P  RIGHT MASTECTMOY AND RADIATION--  NO RECURRANCE   History of CVA (cerebrovascular accident) PER SCAN IN 2011   Hyperlipidemia    Hypertension    Impaired fasting glucose PER PCP  DR Lodema Hong   WATCH DIET   Mixed urge and stress incontinence    Nocturia    Sinus drainage    Thoracic ascending aortic aneurysm (HCC) LAST CHEST CT 09-02-2010--  FOLLOWED BY  CARDIOLOGIST--  DR [id number]   Vaginal wall prolapse    Past Surgical History:  Procedure Laterality Date   ABDOMINAL HYSTERECTOMY     BILATERAL CARPAL TUNNEL RELEASE  1980'S   CARDIAC CATHETERIZATION  02-01-2009  DR EICHHORN   NORMAL CORONARY ARTERIES/ NORMAL LV SIZE AND FUNCTION/ AORTIC ROOT SIZE AT THE UPPER LIMIT OF NORMAL    CARPAL TUNNEL RELEASE     CERVICAL DISC SURGERY  12-12-2005  DR Venetia Maxon   LEFT  C6 - C7 HERINATED / DDD/ SPONDYLOSIS   COLONOSCOPY N/A 02/10/2014   Surgeon: West Bali, MD; Normal TI, mild diverticulosis in sigmoid and transverse colon, small external hemorrhoids, no obvious source for anemia.  Repeat colonoscopy in 10-15 years if benefits outweigh risk.  CYSTOCELE REPAIR  08/02/2012   Procedure: ANTERIOR REPAIR (CYSTOCELE);  Surgeon: Kathi Ludwig, MD;  Location: [facility];  Service: Urology;  Laterality: N/A;  [employer] Uphold Anterior Pelvic Floor Sacrospinus Repair. Anterior wall of the vagina.   ESOPHAGOGASTRODUODENOSCOPY N/A 02/10/2014   Surgeon: West Bali, MD;   Mild gastritis/duodenitis s/p biopsied, no obvious source for anemia.  Pathology with mild chronic gastritis with intestinal metaplasia, negative for H. pylori, duodenal biopsy benign.   GIVENS CAPSULE STUDY  N/A 03/13/2014   Surgeon: West Bali, MD;  Normal exam.  Suspected anemia most likely due to chronic disease.   JOINT REPLACEMENT N/A    Phreesia 06/16/2020   NECK SURGERY  2007   RIGHT PARTIAL MASTECTOMY  11-11-2004  DR PETER YOUNG   DUCTAL CARCINOMA IN SITU RIGHT BREAST   TOTAL ABDOMINAL HYSTERECTOMY W/ BILATERAL SALPINGOOPHORECTOMY  1990   TOTAL KNEE ARTHROPLASTY Right 12/21/2019   Procedure: RIGHT  TOTAL KNEE ARTHROPLASTY;  Surgeon: Eldred Manges, MD;  Location: [facility];  Service: Orthopedics;  Laterality: Right;   TRANSTHORACIC ECHOCARDIOGRAM  03-30-2008  DR MARGARET SIMPSON   LV SIZE AND FUNCTION NORMAL/ MODERATE AORTIC ARCH DILATATION/ MILD MR     Current Meds  Medication Sig   acetaminophen (TYLENOL) 500 MG tablet Take one to  two tablets by mouth at night for pain   Ascorbic Acid (VITAMIN C) 1000 MG tablet Take 1,000 mg by mouth daily.   aspirin EC 325 MG tablet Take 1 tablet (325 mg total) by mouth daily.   betamethasone dipropionate 0.05 % cream Apply sparingly two times daily for 10 days, then as needed, to affected areas( hairline, neck)   carvedilol (COREG) 3.125 MG tablet TAKE 1 TABLET(3.125 MG) BY MOUTH TWICE DAILY WITH A MEAL   chlorthalidone (HYGROTON) 50 MG tablet Take 1 tablet by mouth once daily   Cholecalciferol (VITAMIN D) 50 MCG (2000 UT) tablet Take 2,000 Units by mouth daily.    Ferrous Sulfate (SLOW FE PO) Take 1 capsule by mouth every other day.   loratadine (CLARITIN) 10 MG tablet Take one tablet by mouth  one to two times daily as needed for uncontrolled allergies   [START ON 09/16/2023] losartan (COZAAR) 50 MG tablet Take 1 tablet (50 mg total) by mouth daily.   lovastatin (MEVACOR) 40 MG tablet Take 1 tablet (40 mg total) by mouth daily.   Multiple Vitamin (MULTIVITAMIN) tablet Take 1 tablet by mouth daily. One a day woman's   potassium chloride (KLOR-CON) 10 MEQ tablet Take 1 tablet (10 mEq total) by mouth daily.   terbinafine (LAMISIL) 1 % cream Apply  twice daily to rash on scalp for 1 week, then , as needed   zinc gluconate 50 MG tablet Take 50 mg by mouth daily.     Allergies:   Codeine and Quinolones   Social History   Tobacco Use   Smoking status: Never   Smokeless tobacco: Never  Vaping Use   Vaping status: Never Used  Substance Use Topics   Alcohol use: No   Drug use: No     Family Hx: The patient's family history includes Breast cancer in her sister; Heart attack (age of onset: 7) in her mother; Hypertension in her brother and mother; Kidney failure in her mother; Lung cancer (age of onset: 13) in her father; Prostate cancer in her father; Throat cancer in her father. There is no history of Colon cancer or Colon polyps.  ROS:   Please  see the history of present illness.    All other systems are reviewed and are negative Prior CV studies:   The following studies were reviewed today:  CT angiogram of the aorta 08/09/2020 1. No acute abnormality. 2. Ascending thoracic aortic 4.6 cm aneurysm, increased from 4.4 cm on 10/14/2022 CT using similar measurement technique. Ascending thoracic aortic aneurysm. Recommend semi-annual imaging followup by CTA or MRA and referral to cardiothoracic surgery if not already obtained.  Echocardiogram January 2020 - Left ventricle: Turbulent flow through LVOT No obstruction. The    cavity size was normal. Wall thickness was increased in a pattern    of mild LVH. Systolic function was vigorous. The estimated    ejection fraction was in the range of 65% to 70%.  - Aortic valve: Valve area (VTI): 2.02 cm^2. Valve area (Vmax):    2.08 cm^2. Valve area (Vmean): 2.17 cm^2.    Labs/Other Tests and Data Reviewed:    EKG: Most recent ECG from 06/11/2023 is personally reviewed and shows sinus bradycardia, otherwise normal tracing  EKG Interpretation Date/Time:    Ventricular Rate:    PR Interval:    QRS Duration:    QT Interval:    QTC Calculation:   R Axis:      Text  Interpretation:           Recent Labs: 01/01/2023: Hemoglobin 10.3; Platelets 284; TSH 3.950 06/11/2023: BUN 30; Creatinine, Ser 1.53; Potassium 3.9; Sodium 143 07/13/2023: ALT 13   Recent Lipid Panel Lab Results  Component Value Date/Time   CHOL 166 07/13/2023 09:43 AM   TRIG 39 07/13/2023 09:43 AM   HDL 89 07/13/2023 09:43 AM   CHOLHDL 1.9 07/13/2023 09:43 AM   CHOLHDL 2.3 05/11/2020 10:41 AM   LDLCALC 68 07/13/2023 09:43 AM   LDLCALC 94 05/11/2020 10:41 AM    Wt Readings from Last 3 Encounters:  07/29/23 190 lb 12.8 oz (86.5 kg)  07/16/23 188 lb 0.6 oz (85.3 kg)  06/12/23 187 lb 2.7 oz (84.9 kg)     Objective:    Vital Signs:  BP (!) 144/77   Pulse (!) 53   Ht 5' 1.5" (1.562 m)   Wt 190 lb 12.8 oz (86.5 kg)   SpO2 99%   BMI 35.47 kg/m      General: Alert, oriented x3, no distress, moderately obese Head: no evidence of trauma, PERRL, EOMI, no exophtalmos or lid lag, no myxedema, no xanthelasma; normal ears, nose and oropharynx Neck: normal jugular venous pulsations and no hepatojugular reflux; brisk carotid pulses without delay and no carotid bruits Chest: clear to auscultation, no signs of consolidation by percussion or palpation, normal fremitus, symmetrical and full respiratory excursions Cardiovascular: normal position and quality of the apical impulse, regular rhythm, normal first and second heart sounds, faint 1/6 aortic ejection murmur at the right upper sternal border, no diastolic murmurs, rubs or gallops Abdomen: no tenderness or distention, no masses by palpation, no abnormal pulsatility or arterial bruits, normal bowel sounds, no hepatosplenomegaly Extremities: no clubbing, cyanosis or edema; 2+ radial, ulnar and brachial pulses bilaterally; 2+ right femoral, posterior tibial and dorsalis pedis pulses; 2+ left femoral, posterior tibial and dorsalis pedis pulses; no subclavian or femoral bruits Neurological: grossly nonfocal Psych: Normal mood and affect    HYPERTENSION CONTROL Vitals:   07/29/23 1044 07/29/23 1319  BP: (!) 152/82 (!) 144/77    The patient's blood pressure is elevated above target today.  In order to address the patient's elevated BP: Blood pressure  will be monitored at home to determine if medication changes need to be made.       ASSESSMENT & PLAN:    1. Aneurysm of ascending aorta without rupture (HCC)   2. Left ventricular outflow obstruction   3. Essential hypertension   4. Hypercholesterolemia   5. Stage 3b chronic kidney disease (CKD) (HCC)        Asc Ao Aneurysm: After being stable in size at about 4.2 cm for over 10 years, her an scheduled for follow-up through CT surgery.  Desiree [name] has shown mild gradual enlargement this year (4.4 cm in February, 4.6 cm in October).  LVOT obstruction: No symptoms to suggest that this is important and the murmur sounds fainter than in the past.  She can only tolerate a very low-dose of beta-blocker due to bradycardia. HTN: Usually well-controlled, but elevated today.  Asked her to keep a daily log of her blood pressure.  She cannot take a higher dose of carvedilol due to bradycardia.  She is already on a pretty high dose of diuretic.  The dose of ARB was decreased by her nephrologist last year. HLP: All lipid parameters are in desirable range. CKD: Followed by Dr. Wolfgang Phoenix.   Patient Instructions  Medication Instructions:  No changes *If you need a refill on your cardiac medications before your next appointment, please call your pharmacy*  Keep a BP log for one week and send in by MyChart. Check once daily  Follow-Up: At [facility], you and your health needs are our priority.  As part of our continuing mission to provide you with exceptional heart care, we have created designated Provider Care Teams.  These Care Teams include your primary Cardiologist (physician) and Advanced Practice Providers (APPs -  Physician Assistants and Nurse Practitioners) who all  work together to provide you with the care you need, when you need it.  We recommend signing up for the patient portal called "MyChart".  Sign up information is provided on this After Visit Summary.  MyChart is used to connect with patients for Virtual Visits (Telemedicine).  Patients are able to view lab/test results, encounter notes, upcoming appointments, etc.  Non-urgent messages can be sent to your provider as well.   To learn more about what you can do with MyChart, go to [email or web address].    Your next appointment:   1 year(s)  Provider:   Thurmon Fair, MD       Signed, Thurmon Fair, MD  07/29/2023 1:22 PM    [facility]

## 2023-07-29 NOTE — Patient Instructions (Signed)
Medication Instructions:  No changes *If you need a refill on your cardiac medications before your next appointment, please call your pharmacy*  Keep a BP log for one week and send in by MyChart. Check once daily  Follow-Up: At [facility], you and your health needs are our priority.  As part of our continuing mission to provide you with exceptional heart care, we have created designated Provider Care Teams.  These Care Teams include your primary Cardiologist (physician) and Advanced Practice Providers (APPs -  Physician Assistants and Nurse Practitioners) who all work together to provide you with the care you need, when you need it.  We recommend signing up for the patient portal called "MyChart".  Sign up information is provided on this After Visit Summary.  MyChart is used to connect with patients for Virtual Visits (Telemedicine).  Patients are able to view lab/test results, encounter notes, upcoming appointments, etc.  Non-urgent messages can be sent to your provider as well.   To learn more about what you can do with MyChart, go to [email or web address].    Your next appointment:   1 year(s)  Provider:   Thurmon Fair, MD

## 2023-09-02 ENCOUNTER — Other Ambulatory Visit: Payer: Self-pay | Admitting: Thoracic Surgery (Cardiothoracic Vascular Surgery)

## 2023-09-02 DIAGNOSIS — I712 Thoracic aortic aneurysm, without rupture, unspecified: Secondary | ICD-10-CM

## 2023-09-24 DIAGNOSIS — D631 Anemia in chronic kidney disease: Secondary | ICD-10-CM | POA: Diagnosis not present

## 2023-09-24 DIAGNOSIS — N189 Chronic kidney disease, unspecified: Secondary | ICD-10-CM | POA: Diagnosis not present

## 2023-09-24 DIAGNOSIS — R809 Proteinuria, unspecified: Secondary | ICD-10-CM | POA: Diagnosis not present

## 2023-09-24 DIAGNOSIS — E211 Secondary hyperparathyroidism, not elsewhere classified: Secondary | ICD-10-CM | POA: Diagnosis not present

## 2023-10-01 DIAGNOSIS — N1832 Chronic kidney disease, stage 3b: Secondary | ICD-10-CM | POA: Diagnosis not present

## 2023-10-01 DIAGNOSIS — D638 Anemia in other chronic diseases classified elsewhere: Secondary | ICD-10-CM | POA: Diagnosis not present

## 2023-10-01 DIAGNOSIS — I129 Hypertensive chronic kidney disease with stage 1 through stage 4 chronic kidney disease, or unspecified chronic kidney disease: Secondary | ICD-10-CM | POA: Diagnosis not present

## 2023-10-01 DIAGNOSIS — I712 Thoracic aortic aneurysm, without rupture, unspecified: Secondary | ICD-10-CM | POA: Diagnosis not present

## 2023-10-07 NOTE — Progress Notes (Signed)
 [address]       [place] [address]             [phone]   Chief Complaint: Ascending thoracic aortic aneurysm (ATAA)  HPI: This is a 78 year old female with a past medical history of hypertension,  hyperlipidemia, right breast cancer (s/p mastectomy, Radiation), Bell's palsy,  GERD, heart murmur (dynamic systolic ejection murmur related to LV  outflow tract obstruction), arthritis, CVA (no neurologic deficits), fasting glucose, mixed urge and stress incontinence who was found to have an ascending thoracic aortic aneurysm in 2009. She was last seen by myself in February 2024 and the ATAA at that time measured 4.2 cm. She presents today for further surveillance of the ATAA. She denies chest pain, pressure, tightness, LE edema. She has had no changes to her medical or surgical history since her last follow up here.     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--  S/P  RIGHT MASTECTMOY AND RADIATION--  NO RECURRANCE   History of CVA (cerebrovascular accident) PER SCAN IN 2011   Hyperlipidemia     Hypertension     Impaired fasting glucose PER PCP  DR Lodema Hong    WATCH DIET   Mixed urge and stress incontinence     Nocturia     Sinus drainage     Thoracic ascending aortic aneurysm (HCC) LAST CHEST CT 09-02-2010--  FOLLOWED BY  CARDIOLOGIST--  DR [id number]   Vaginal wall prolapse                 Past Surgical History:  Procedure Laterality Date   ABDOMINAL HYSTERECTOMY       BILATERAL CARPAL TUNNEL RELEASE   1980'S   CARDIAC CATHETERIZATION   02-01-2009  DR EICHHORN    NORMAL CORONARY ARTERIES/ NORMAL LV SIZE AND  FUNCTION/ AORTIC ROOT SIZE AT THE UPPER LIMIT OF NORMAL    CARPAL TUNNEL RELEASE       CERVICAL DISC SURGERY   12-12-2005  DR Venetia Maxon    LEFT  C6 - C7 HERINATED / DDD/ SPONDYLOSIS   COLONOSCOPY N/A 02/10/2014    Surgeon: West Bali, MD; Normal TI, mild diverticulosis in sigmoid and transverse colon, small external hemorrhoids, no obvious source for anemia.  Repeat colonoscopy in 10-15 years if benefits outweigh risk.   CYSTOCELE REPAIR   08/02/2012    Procedure: ANTERIOR REPAIR (CYSTOCELE);  Surgeon: Kathi Ludwig, MD;  Location: [facility]  Service: Urology;  Laterality: N/A;  [employer] Uphold Anterior Pelvic Floor Sacrospinus Repair. Anterior wall of the vagina.   ESOPHAGOGASTRODUODENOSCOPY N/A 02/10/2014    Surgeon: West Bali, MD;   Mild gastritis/duodenitis s/p biopsied, no obvious source for anemia.  Pathology with mild chronic gastritis with intestinal metaplasia, negative for H. pylori, duodenal biopsy benign.   GIVENS CAPSULE STUDY N/A 03/13/2014    Surgeon: West Bali, MD;  Normal exam.  Suspected anemia most likely due to chronic disease.   JOINT REPLACEMENT N/A      Phreesia 06/16/2020   NECK  SURGERY   2007   RIGHT PARTIAL MASTECTOMY   11-11-2004  DR PETER YOUNG    DUCTAL CARCINOMA IN SITU RIGHT BREAST   TOTAL ABDOMINAL HYSTERECTOMY W/ BILATERAL SALPINGOOPHORECTOMY   1990   TOTAL KNEE ARTHROPLASTY Right 12/21/2019    Procedure: RIGHT  TOTAL KNEE ARTHROPLASTY;  Surgeon: Eldred Manges, MD;  Location: [facility];  Service: Orthopedics;  Laterality: Right;   TRANSTHORACIC ECHOCARDIOGRAM   03-30-2008  DR MARGARET SIMPSON    LV SIZE AND FUNCTION NORMAL/ MODERATE AORTIC ARCH DILATATION/ MILD MR               Family History  Problem Relation Age of Onset   Kidney failure Mother     Hypertension Mother     Heart attack Mother 43   Throat cancer Father     Prostate cancer Father     Lung cancer Father 26   Breast cancer Sister      Hypertension Brother          2 stents   Colon cancer Neg Hx     Colon polyps Neg Hx            Social History Social History[] Expand by [employer]  Social History        Tobacco Use   Smoking status: Never   Smokeless tobacco: Never  Vaping Use   Vaping Use: Never used  Substance Use Topics   Alcohol use: No   Drug use: No       Current Outpatient Medications  Medication Sig Dispense Refill   acetaminophen (TYLENOL) 500 MG tablet Take one to  two tablets by mouth at night for pain 50 tablet 0   Ascorbic Acid (VITAMIN C) 1000 MG tablet Take 1,000 mg by mouth daily.     aspirin EC 325 MG tablet Take 1 tablet (325 mg total) by mouth daily. 100 tablet 3   betamethasone dipropionate 0.05 % cream Apply sparingly two times daily for 10 days, then as needed, to affected areas( hairline, neck) 45 g 1   carvedilol (COREG) 3.125 MG tablet TAKE 1 TABLET(3.125 MG) BY MOUTH TWICE DAILY WITH A MEAL 180 tablet 3   chlorthalidone (HYGROTON) 50 MG tablet Take 1 tablet by mouth once daily 90 tablet 0   Cholecalciferol (VITAMIN D) 50 MCG (2000 UT) tablet Take 2,000 Units by mouth daily.      Ferrous Sulfate (SLOW FE PO) Take 1 capsule by mouth every other day.     loratadine (CLARITIN) 10 MG tablet Take one tablet by mouth  one to two times daily as needed for uncontrolled allergies 90 tablet 2   losartan (COZAAR) 50 MG tablet Take 1 tablet (50 mg total) by mouth daily. 90 tablet 3   lovastatin (MEVACOR) 40 MG tablet Take 1 tablet (40 mg total) by mouth daily. 90 tablet 2   Multiple Vitamin (MULTIVITAMIN) tablet Take 1 tablet by mouth daily. One a day woman's     potassium chloride (KLOR-CON) 10 MEQ tablet Take 1 tablet (10 mEq total) by mouth daily. 90 tablet 1   terbinafine (LAMISIL) 1 % cream Apply twice daily to rash on scalp for 1 week, then , as needed 42 g 0   zinc gluconate 50 MG tablet Take 50 mg by mouth daily.    Vital Signs: Vitals:   10/21/23 1416  BP: 117/72  Pulse: 92  Resp: 18   SpO2: 98%     Physical Exam: CV-RRR, faint grad I/VI murmur best heard  upper sternal border Neck-No carotid bruit Pulmonary-Clear to auscultation bilaterally Abdomen-Soft, non tender, bowel sounds present Extremities-no LE edema Neurologic-Grossly intact without focal deficit  Diagnostic Tests:  Narrative & Impression  CLINICAL DATA:  Thoracic aortic aneurysm follow-up   EXAM: CT ANGIOGRAPHY CHEST WITH CONTRAST   TECHNIQUE: Multidetector CT imaging of the chest was performed using the standard protocol during bolus administration of intravenous contrast. Multiplanar CT image reconstructions and MIPs were obtained to evaluate the vascular anatomy.   RADIATION DOSE REDUCTION: This exam was performed according to the departmental dose-optimization program which includes automated exposure control, adjustment of the mA and/or kV according to patient size and/or use of iterative reconstruction technique.   CONTRAST:  60mL ISOVUE-370 IOPAMIDOL (ISOVUE-370) INJECTION 76%   COMPARISON:  06/18/2023   FINDINGS: Cardiovascular: Mid ascending thoracic aorta measures approximately 4.1 cm in diameter (series 8, image 52), assessment slightly degraded by motion. Aortic atherosclerosis. Central pulmonary vasculature within normal limits. Heart size is upper limits of normal. No pericardial effusion.   Mediastinum/Nodes: No enlarged mediastinal, hilar, or axillary lymph nodes. Thyroid gland, trachea, and esophagus demonstrate no significant findings.   Lungs/Pleura: Mild scarring or atelectasis within the right middle lobe and lingula. Lungs are otherwise clear. No pleural effusion or pneumothorax.   Upper Abdomen: No new or acute abnormality within the included upper abdomen.   Musculoskeletal: Degenerative thoracic spondylosis, similar to prior. No new or acute osseous abnormality. No chest wall abnormality.   Review of the MIP images confirms the above findings.    IMPRESSION: 1. Mid ascending thoracic aorta measuring approximately 4.1 cm in diameter. Recommend annual imaging followup by CTA or MRA. This recommendation follows 2010 ACCF/AHA/AATS/ACR/ASA/SCA/SCAI/SIR/STS/SVM Guidelines for the Diagnosis and Management of Patients with Thoracic Aortic Disease. Circulation. 2010; 121: Z610-R604. Aortic aneurysm NOS (ICD10-I71.9) 2. Aortic atherosclerosis (ICD10-I70.0).     Electronically Signed   By: Duanne Guess D.O.   On: 10/21/2023 14:34      Impression and Plan: CTA with a 4.1 cm ascending aortic aneurysm.  Previous echocardiograms done showed aortic valve to be tri leaflet and no evidence of aortic regurgitation.  We discussed the natural history and and risk factors for growth of ascending aortic aneurysms.  We covered the importance of smoking cessation, tight blood pressure control, refraining from lifting heavy objects, and avoiding fluoroquinolones.  We discussed the patient does not need surgical intervention for the ATAA at this time. The patient is aware of signs and symptoms of aortic dissection and when to present to the emergency department.   Of note, I did discuss with patient and daughter that CT chest without contrast done at [facility] in October 2024 showed 4.6 cm ATAA but no other CTAs have been above 4.2 cm. We will continue surveillance and a repeat CTA  was ordered for 1 year, as requested by patient. We will defer to cardiology if thinks needs future echocardiograms.  Ardelle Balls, PA-C [facility] [phone]

## 2023-10-08 ENCOUNTER — Other Ambulatory Visit: Payer: Self-pay | Admitting: Family Medicine

## 2023-10-21 ENCOUNTER — Ambulatory Visit
Admission: RE | Admit: 2023-10-21 | Discharge: 2023-10-21 | Disposition: A | Discharge status: Home / Self Care | Payer: PPO | Source: Ambulatory Visit | Attending: Thoracic Surgery (Cardiothoracic Vascular Surgery) | Admitting: Thoracic Surgery (Cardiothoracic Vascular Surgery)

## 2023-10-21 ENCOUNTER — Encounter: Payer: Self-pay | Admitting: Physician Assistant

## 2023-10-21 ENCOUNTER — Ambulatory Visit (INDEPENDENT_AMBULATORY_CARE_PROVIDER_SITE_OTHER): Payer: PPO | Admitting: Physician Assistant

## 2023-10-21 VITALS — BP 117/72 | HR 92 | Resp 18 | Ht 61.0 in | Wt 195.0 lb

## 2023-10-21 DIAGNOSIS — I7121 Aneurysm of the ascending aorta, without rupture: Secondary | ICD-10-CM

## 2023-10-21 DIAGNOSIS — I712 Thoracic aortic aneurysm, without rupture, unspecified: Secondary | ICD-10-CM

## 2023-10-21 DIAGNOSIS — I700 Atherosclerosis of aorta: Secondary | ICD-10-CM | POA: Diagnosis not present

## 2023-10-21 MED ORDER — IOPAMIDOL (ISOVUE-370) INJECTION 76%
60.0000 mL | Freq: Once | INTRAVENOUS | Status: AC | PRN
  Administered 2023-10-21: 60 mL via INTRAVENOUS

## 2023-10-21 NOTE — Patient Instructions (Signed)
  Risk Modification in those with ascending thoracic aortic aneurysm:  Continue good control of blood pressure (prefer SBP 130/80 or less)-continue with Carvedilol (Coreg) and Losartan (Cozaar)  2. Avoid fluoroquinolone antibiotics (I.e Ciprofloxacin, Avelox, Levofloxacin, Ofloxacin)  3.  Use of statin (to decrease cardiovascular risk)-continue with Lovastatin (Mevacor)  4.  Exercise and activity limitations is individualized, but in general, contact sports are to be avoided and one should avoid heavy lifting (defined as half of ideal body weight) and exercises involving sustained Valsalva maneuver.  5. Counseling for those suspected of having genetically mediated disease. First-degree relatives of those with TAA disease should be screened as well as those who have a connective tissue disease (I.e with Marfan syndrome, Ehlers-Danlos syndrome, and Loeys-Dietz syndrome) or a  bicuspid aortic valve,have an increased risk for complications related to TAA. Patient without a history of connective tissue disease. Previous echocardiograms done  showed aortic valve to be tri leaflet and no evidence of aortic regurgitation.   6. She has no history of tobacco abuse.

## 2023-12-07 ENCOUNTER — Ambulatory Visit: Payer: Self-pay | Admitting: *Deleted

## 2023-12-07 ENCOUNTER — Encounter: Payer: Self-pay | Admitting: Internal Medicine

## 2023-12-07 ENCOUNTER — Ambulatory Visit (INDEPENDENT_AMBULATORY_CARE_PROVIDER_SITE_OTHER): Admitting: Internal Medicine

## 2023-12-07 VITALS — BP 103/66 | HR 81 | Ht 61.0 in | Wt 192.2 lb

## 2023-12-07 DIAGNOSIS — N1832 Chronic kidney disease, stage 3b: Secondary | ICD-10-CM | POA: Diagnosis not present

## 2023-12-07 DIAGNOSIS — M545 Low back pain, unspecified: Secondary | ICD-10-CM | POA: Insufficient documentation

## 2023-12-07 DIAGNOSIS — M5030 Other cervical disc degeneration, unspecified cervical region: Secondary | ICD-10-CM | POA: Insufficient documentation

## 2023-12-07 MED ORDER — PREDNISONE 20 MG PO TABS: 40.0000 mg | ORAL_TABLET | Freq: Every day | ORAL | 0 refills | Status: DC

## 2023-12-07 MED ORDER — METHYLPREDNISOLONE ACETATE 80 MG/ML IJ SUSP
80.0000 mg | Freq: Once | INTRAMUSCULAR | Status: AC
  Administered 2023-12-07: 80 mg via INTRAMUSCULAR

## 2023-12-07 MED ORDER — TIZANIDINE HCL 2 MG PO TABS: 2.0000 mg | ORAL_TABLET | Freq: Three times a day (TID) | ORAL | 0 refills | Status: DC | PRN

## 2023-12-07 NOTE — Assessment & Plan Note (Signed)
 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

## 2023-12-07 NOTE — Assessment & Plan Note (Signed)
 Last BMP reviewed Avoid oral NSAIDs Maintain adequate hydration

## 2023-12-07 NOTE — Progress Notes (Signed)
 Acute Office Visit  Subjective:    Patient ID: Desiree [name], female    DOB: Nov 19, 1945, 78 y.o.   MRN: [id number]  Chief Complaint  Patient presents with   Back Pain    Pt reports sx of left lower back pain radiating down her leg, ongoing for 2 weeks, has been using tylenol and arthritis gel    HPI Patient is in today for complaint of left-sided lower back pain for the last 2 weeks, which is constant, sharp, radiating to LLE, but denies any numbness or tingling of the LE.  Denies any recent injury or heavy lifting.  She has had similar mid back pain before.  She has tried Tylenol and Voltaren gel without much relief.  She has history of multilevel disc degeneration of cervical spine and neural foraminal stenosis, but no imaging of lumbar spine is available to review.  Denies saddle anesthesia, urinary or stool incontinence.  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--  S/P  RIGHT MASTECTMOY AND RADIATION--  NO RECURRANCE   History of CVA (cerebrovascular accident) PER SCAN IN 2011   Hyperlipidemia    Hypertension    Impaired fasting glucose PER PCP  DR Lodema Hong   WATCH DIET   Mixed urge and stress incontinence    Nocturia    Sinus drainage    Thoracic ascending aortic aneurysm (HCC) LAST CHEST CT 09-02-2010--  FOLLOWED BY  CARDIOLOGIST--  DR [id number]   Vaginal wall prolapse     Past Surgical History:  Procedure Laterality Date   ABDOMINAL HYSTERECTOMY     BILATERAL CARPAL TUNNEL RELEASE  1980'S   CARDIAC CATHETERIZATION  02-01-2009  DR EICHHORN   NORMAL CORONARY ARTERIES/ NORMAL LV SIZE AND FUNCTION/ AORTIC  ROOT SIZE AT THE UPPER LIMIT OF NORMAL    CARPAL TUNNEL RELEASE     CERVICAL DISC SURGERY  12-12-2005  DR Venetia Maxon   LEFT  C6 - C7 HERINATED / DDD/ SPONDYLOSIS   COLONOSCOPY N/A 02/10/2014   Surgeon: West Bali, MD; Normal TI, mild diverticulosis in sigmoid and transverse colon, small external hemorrhoids, no obvious source for anemia.  Repeat colonoscopy in 10-15 years if benefits outweigh risk.   CYSTOCELE REPAIR  08/02/2012   Procedure: ANTERIOR REPAIR (CYSTOCELE);  Surgeon: Kathi Ludwig, MD;  Location: [facility];  Service: Urology;  Laterality: N/A;  [employer] Uphold Anterior Pelvic Floor Sacrospinus Repair. Anterior wall of the vagina.   ESOPHAGOGASTRODUODENOSCOPY N/A 02/10/2014   Surgeon: West Bali, MD;   Mild gastritis/duodenitis s/p biopsied, no obvious source for anemia.  Pathology with mild chronic gastritis with intestinal metaplasia, negative for H. pylori, duodenal biopsy benign.   GIVENS CAPSULE STUDY N/A 03/13/2014   Surgeon: West Bali, MD;  Normal exam.  Suspected anemia most likely due to chronic disease.   JOINT REPLACEMENT N/A    [employer] 06/16/2020   NECK SURGERY  2007   RIGHT PARTIAL MASTECTOMY  11-11-2004  DR PETER YOUNG   DUCTAL CARCINOMA IN SITU RIGHT BREAST   TOTAL ABDOMINAL HYSTERECTOMY W/ BILATERAL SALPINGOOPHORECTOMY  1990  TOTAL KNEE ARTHROPLASTY Right 12/21/2019   Procedure: RIGHT  TOTAL KNEE ARTHROPLASTY;  Surgeon: Adah Acron, MD;  Location: [facility] OR;  Service: Orthopedics;  Laterality: Right;   TRANSTHORACIC ECHOCARDIOGRAM  03-30-2008  DR MARGARET SIMPSON   LV SIZE AND FUNCTION NORMAL/ MODERATE AORTIC ARCH DILATATION/ MILD MR    Family History  Problem Relation Age of Onset   Kidney failure Mother    Hypertension Mother    Heart attack Mother 64   Throat cancer Father    Prostate cancer Father    Lung cancer Father 5   Breast cancer Sister    Hypertension Brother        2 stents   Colon cancer Neg Hx     Colon polyps Neg Hx     Social History   Socioeconomic History   Marital status: Divorced    Spouse name: Not on file   Number of children: 4   Years of education: 12+   Highest education level: Some college, no degree  Occupational History   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  Tobacco Use   Smoking status: Never   Smokeless tobacco: Never  Vaping Use   Vaping status: Never Used  Substance and Sexual Activity   Alcohol use: No   Drug use: No   Sexual activity: Never  Other Topics Concern   Not on file  Social History Narrative   RETIRED IN June 2020.    Social Drivers of Corporate investment banker Strain: Low Risk  (07/05/2021)   Overall Financial Resource Strain (CARDIA)    Difficulty of Paying Living Expenses: Not hard at all  Food Insecurity: No Food Insecurity (07/05/2021)   Hunger Vital Sign    Worried About Running Out of Food in the Last Year: Never true    Ran Out of Food in the Last Year: Never true  Transportation Needs: No Transportation Needs (07/05/2021)   PRAPARE - Administrator, Civil Service (Medical): No    Lack of Transportation (Non-Medical): No  Physical Activity: Insufficiently Active (07/05/2021)   Exercise Vital Sign    Days of Exercise per Week: 3 days    Minutes of Exercise per Session: 30 min  Stress: No Stress Concern Present (06/19/2020)   [employer] of Occupational Health - Occupational Stress Questionnaire    Feeling of Stress : Not at all  Social Connections: Moderately Integrated (07/05/2021)   Social Connection and Isolation Panel [NHANES]    Frequency of Communication with Friends and Family: More than three times a week    Frequency of Social Gatherings with Friends and Family: More than three times a week    Attends Religious Services: More than 4 times per year    Active Member of [employer] or Organizations: Yes    Attends Engineer, structural: More than  4 times per year    Marital Status: Divorced  Intimate Partner Violence: Not At Risk (07/05/2021)   Humiliation, Afraid, Rape, and Kick questionnaire    Fear of Current or Ex-Partner: No    Emotionally Abused: No    Physically Abused: No    Sexually Abused: No    Outpatient Medications Prior to Visit  Medication Sig Dispense Refill   acetaminophen (TYLENOL) 500 MG tablet Take one to  two tablets by mouth at night for pain 50 tablet 0   Ascorbic Acid (VITAMIN C) 1000 MG tablet Take 1,000 mg by mouth  daily.     aspirin EC 325 MG tablet Take 1 tablet (325 mg total) by mouth daily. 100 tablet 3   betamethasone dipropionate 0.05 % cream Apply sparingly two times daily for 10 days, then as needed, to affected areas( hairline, neck) 45 g 1   carvedilol (COREG) 3.125 MG tablet TAKE 1 TABLET(3.125 MG) BY MOUTH TWICE DAILY WITH A MEAL 180 tablet 3   chlorthalidone (HYGROTON) 50 MG tablet Take 1 tablet by mouth once daily 90 tablet 0   Cholecalciferol (VITAMIN D) 50 MCG (2000 UT) tablet Take 2,000 Units by mouth daily.      Ferrous Sulfate (SLOW FE PO) Take 1 capsule by mouth every other day.     loratadine (CLARITIN) 10 MG tablet Take one tablet by mouth  one to two times daily as needed for uncontrolled allergies 90 tablet 2   losartan (COZAAR) 50 MG tablet Take 1 tablet (50 mg total) by mouth daily. 90 tablet 3   lovastatin (MEVACOR) 40 MG tablet Take 1 tablet (40 mg total) by mouth daily. 90 tablet 2   Multiple Vitamin (MULTIVITAMIN) tablet Take 1 tablet by mouth daily. One a day woman's     potassium chloride (KLOR-CON) 10 MEQ tablet Take 1 tablet (10 mEq total) by mouth daily. 90 tablet 1   terbinafine (LAMISIL) 1 % cream Apply twice daily to rash on scalp for 1 week, then , as needed 42 g 0   zinc gluconate 50 MG tablet Take 50 mg by mouth daily.     No facility-administered medications prior to visit.    Allergies  Allergen Reactions   Codeine Other (See Comments)    CONFUSION/  DIZZY Other reaction(s): Other (see comments) CONFUSION/ DIZZY   Quinolones Other (See Comments)    Patient was warned about not using Cipro and similar antibiotics. Recent studies have raised concern that fluoroquinolone antibiotics could be associated with an increased risk of aortic aneurysm Fluoroquinolones have non-antimicrobial properties that might jeopardise the integrity of the extracellular matrix of the vascular wall I. Other reaction(s): Other (see comments) Patient was warned about not using Cipro and similar antibiotics. Recent studies have raised concern that fluoroquinolone antibiotics could be associated with an increased risk of aortic aneurysm Fluoroquinolones have non-antimicrobial properties that might jeopardise the integrity of the extracellular matrix of the vascular wall I.    Review of Systems  Constitutional:  Negative for chills and fever.  HENT:  Negative for congestion, sinus pressure, sinus pain and sore throat.   Eyes:  Negative for pain and discharge.  Respiratory:  Negative for cough and shortness of breath.   Cardiovascular:  Negative for chest pain and palpitations.  Gastrointestinal:  Negative for abdominal pain, diarrhea, nausea and vomiting.  Endocrine: Negative for polydipsia and polyuria.  Genitourinary:  Negative for dysuria and hematuria.  Musculoskeletal:  Positive for back pain. Negative for neck pain and neck stiffness.  Skin:  Negative for rash.  Neurological:  Negative for dizziness and weakness.  Psychiatric/Behavioral:  Negative for agitation and behavioral problems.        Objective:    Physical Exam Vitals reviewed.  Constitutional:      General: She is not in acute distress.    Appearance: She is not diaphoretic.  HENT:     Head: Normocephalic and atraumatic.     Nose: Nose normal.     Mouth/Throat:     Mouth: Mucous membranes are moist.  Eyes:     General: No scleral icterus.  Extraocular Movements: Extraocular  movements intact.  Cardiovascular:     Rate and Rhythm: Normal rate and regular rhythm.     Heart sounds: Normal heart sounds. No murmur heard. Pulmonary:     Breath sounds: Normal breath sounds. No wheezing or rales.  Abdominal:     Palpations: Abdomen is soft.     Tenderness: There is no abdominal tenderness.  Musculoskeletal:     Cervical back: Neck supple. Tenderness present.     Lumbar back: Tenderness present. Decreased range of motion. Positive left straight leg raise test. Negative right straight leg raise test.     Right lower leg: No edema.     Left lower leg: No edema.  Skin:    General: Skin is warm.     Findings: No rash.  Neurological:     General: No focal deficit present.     Mental Status: She is alert and oriented to person, place, and time.  Psychiatric:        Mood and Affect: Mood normal.        Behavior: Behavior normal.     BP 103/66   Pulse 81   Ht 5\' 1"  (1.549 m)   Wt 192 lb 3.2 oz (87.2 kg)   SpO2 95%   BMI 36.32 kg/m  Wt Readings from Last 3 Encounters:  12/07/23 192 lb 3.2 oz (87.2 kg)  10/21/23 195 lb (88.5 kg)  07/29/23 190 lb 12.8 oz (86.5 kg)        Assessment & Plan:   Problem List Items Addressed This Visit       Musculoskeletal and Integument   DDD (degenerative disc disease), cervical   Has chronic neck pain, previous MRI of cervical spine reviewed Tizanidine as needed for muscle spasms Considering extent of DDD of cervical spine, there is high likelihood of DDD of lumbar spine as well        Genitourinary   Stage 3b chronic kidney disease (CKD) (HCC)   Last BMP reviewed Avoid oral NSAIDs Maintain adequate hydration        Other   Left lumbar pain - Primary   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 Pain improves with bending, concern for lumbar spinal stenosis Check x-ray of lumbar spine Depo-Medrol 80 mg IM today, followed by oral prednisone Tizanidine as needed for muscle spasms Avoid oral NSAIDs due to history of  CKD Avoid heavy lifting and frequent bending Advised to use back brace and/or heating pad as needed for back pain Simple back exercises advised      Relevant Medications   predniSONE (DELTASONE) 20 MG tablet   tiZANidine (ZANAFLEX) 2 MG tablet   Other Relevant Orders   DG Lumbar Spine Complete     Meds ordered this encounter  Medications   predniSONE (DELTASONE) 20 MG tablet    Sig: Take 2 tablets (40 mg total) by mouth daily with breakfast.    Dispense:  10 tablet    Refill:  0   tiZANidine (ZANAFLEX) 2 MG tablet    Sig: Take 1 tablet (2 mg total) by mouth every 8 (eight) hours as needed for muscle spasms.    Dispense:  30 tablet    Refill:  0   methylPREDNISolone acetate (DEPO-MEDROL) injection 80 mg     Anabel Halon, MD

## 2023-12-07 NOTE — Patient Instructions (Signed)
 Please start taking Prednisone 2 tablets once daily as prescribed.  Please take Tizanidine as needed for muscle spasms/stiffness.  Please apply heating pad and/or back brace as needed for back pain.  Please avoid heavy lifting.  Please get X-ray of lumbar spine done at [facility].

## 2023-12-07 NOTE — Telephone Encounter (Signed)
 Copied from CRM [id number]. Topic: Clinical - Red Word Triage >> Dec 07, 2023  7:44 AM Leory Rands wrote: Red Word that prompted transfer to Nurse Triage: Patient is calling to report left side back pain for over one week. Please advise Reason for Disposition  [1] MODERATE back pain (e.g., interferes with normal activities) AND [2] present > 3 days  Answer Assessment - Initial Assessment Questions 1. ONSET: "When did the pain begin?"      Over a week now.   It's in my left side lower back. 2. LOCATION: "Where does it hurt?" (upper, mid or lower back)     Left lower back 3. SEVERITY: "How bad is the pain?"  (e.g., Scale 1-10; mild, moderate, or severe)   - MILD (1-3): Doesn't interfere with normal activities.    - MODERATE (4-7): Interferes with normal activities or awakens from sleep.    - SEVERE (8-10): Excruciating pain, unable to do any normal activities.      It's a little swollen 4. PATTERN: "Is the pain constant?" (e.g., yes, no; constant, intermittent)      Constant   Hard to sleep or sit even walking it's there. 5. RADIATION: "Does the pain shoot into your legs or somewhere else?"     Sometimes it goes down my left leg and feel numb like. 6. CAUSE:  "What do you think is causing the back pain?"      I don't know 7. BACK OVERUSE:  "Any recent lifting of heavy objects, strenuous work or exercise?"     No  8. MEDICINES: "What have you taken so far for the pain?" (e.g., nothing, acetaminophen, NSAIDS)     I'm taking TYlenol for a week.   9. NEUROLOGIC SYMPTOMS: "Do you have any weakness, numbness, or problems with bowel/bladder control?"     Numbness in my left leg 10. OTHER SYMPTOMS: "Do you have any other symptoms?" (e.g., fever, abdomen pain, burning with urination, blood in urine)       No 11. PREGNANCY: "Is there any chance you are pregnant?" "When was your last menstrual period?"       N/A due to age  Protocols used: Back Pain-A-AH  Chief Complaint: Left lower back  pain Symptoms: Radiation down left leg Frequency: Over a week now Pertinent Negatives: Patient denies accidents or falls Disposition: [] ED /[] Urgent Care (no appt availability in office) / [x] Appointment(In office/virtual)/ []  [facility]/ [] Home Care/ [] Refused Recommended Disposition /[] [facility]/ []  Follow-up with PCP Additional Notes: Appt made for today at 2:20 with Dr. Lydia Sams.

## 2023-12-07 NOTE — Assessment & Plan Note (Signed)
 Has chronic neck pain, previous MRI of cervical spine reviewed Tizanidine as needed for muscle spasms Considering extent of DDD of cervical spine, there is high likelihood of DDD of lumbar spine as well

## 2023-12-10 ENCOUNTER — Ambulatory Visit (HOSPITAL_COMMUNITY)
Admission: RE | Admit: 2023-12-10 | Discharge: 2023-12-10 | Disposition: A | Discharge status: Home / Self Care | Source: Ambulatory Visit | Attending: Internal Medicine | Admitting: Internal Medicine

## 2023-12-10 DIAGNOSIS — M545 Low back pain, unspecified: Secondary | ICD-10-CM | POA: Insufficient documentation

## 2023-12-10 DIAGNOSIS — M47816 Spondylosis without myelopathy or radiculopathy, lumbar region: Secondary | ICD-10-CM | POA: Diagnosis not present

## 2023-12-16 ENCOUNTER — Telehealth: Payer: Self-pay | Admitting: Family Medicine

## 2023-12-16 NOTE — Telephone Encounter (Unsigned)
 Copied from CRM [id number]. Topic: Clinical - Lab/Test Results >> Dec 16, 2023 12:35 PM Crispin Dolphin wrote: Reason for CRM: Pt called to check results of scan 4/17. Showing results have not yet been finalized. Can someone reach out to patient once finalized and reviewed. Thank You

## 2024-01-05 ENCOUNTER — Other Ambulatory Visit: Payer: Self-pay | Admitting: Family Medicine

## 2024-01-06 ENCOUNTER — Encounter: Payer: Self-pay | Admitting: *Deleted

## 2024-01-12 DIAGNOSIS — D631 Anemia in chronic kidney disease: Secondary | ICD-10-CM | POA: Diagnosis not present

## 2024-01-12 DIAGNOSIS — R809 Proteinuria, unspecified: Secondary | ICD-10-CM | POA: Diagnosis not present

## 2024-01-12 DIAGNOSIS — N189 Chronic kidney disease, unspecified: Secondary | ICD-10-CM | POA: Diagnosis not present

## 2024-01-13 ENCOUNTER — Encounter: Payer: Self-pay | Admitting: Family Medicine

## 2024-01-13 ENCOUNTER — Other Ambulatory Visit: Payer: Self-pay | Admitting: Family Medicine

## 2024-01-13 ENCOUNTER — Ambulatory Visit (INDEPENDENT_AMBULATORY_CARE_PROVIDER_SITE_OTHER): Payer: PPO | Admitting: Family Medicine

## 2024-01-13 VITALS — BP 120/79 | HR 70 | Resp 18 | Ht 61.5 in | Wt 188.1 lb

## 2024-01-13 DIAGNOSIS — M545 Low back pain, unspecified: Secondary | ICD-10-CM

## 2024-01-13 DIAGNOSIS — M2042 Other hammer toe(s) (acquired), left foot: Secondary | ICD-10-CM | POA: Diagnosis not present

## 2024-01-13 DIAGNOSIS — I10 Essential (primary) hypertension: Secondary | ICD-10-CM | POA: Diagnosis not present

## 2024-01-13 DIAGNOSIS — E559 Vitamin D deficiency, unspecified: Secondary | ICD-10-CM

## 2024-01-13 DIAGNOSIS — E785 Hyperlipidemia, unspecified: Secondary | ICD-10-CM | POA: Diagnosis not present

## 2024-01-13 DIAGNOSIS — N1832 Chronic kidney disease, stage 3b: Secondary | ICD-10-CM | POA: Diagnosis not present

## 2024-01-13 DIAGNOSIS — Z1231 Encounter for screening mammogram for malignant neoplasm of breast: Secondary | ICD-10-CM

## 2024-01-13 DIAGNOSIS — D649 Anemia, unspecified: Secondary | ICD-10-CM

## 2024-01-13 DIAGNOSIS — R7989 Other specified abnormal findings of blood chemistry: Secondary | ICD-10-CM | POA: Diagnosis not present

## 2024-01-13 MED ORDER — TIZANIDINE HCL 2 MG PO TABS: 2.0000 mg | ORAL_TABLET | Freq: Three times a day (TID) | ORAL | 0 refills | Status: DC | PRN

## 2024-01-13 MED ORDER — CHLORTHALIDONE 50 MG PO TABS: 50.0000 mg | ORAL_TABLET | Freq: Every day | ORAL | 3 refills | Status: AC

## 2024-01-13 NOTE — Assessment & Plan Note (Signed)
 Controlled, no change in medication DASH diet and commitment to daily physical activity for a minimum of 30 minutes discussed and encouraged, as a part of hypertension management. The importance of attaining a healthy weight is also discussed.     01/13/2024   10:04 AM 12/07/2023    2:06 PM 10/21/2023    2:16 PM 07/29/2023    1:19 PM 07/29/2023   10:44 AM 07/16/2023   10:07 AM 06/12/2023    7:28 PM  BP/Weight  Systolic BP 120 103 117 144 152 127 129  Diastolic BP 79 66 72 77 82 78 79  Wt. (Lbs) 188.08 192.2 195  190.8 188.04   BMI 34.96 kg/m2 36.32 kg/m2 36.84 kg/m2  35.47 kg/m2 35.53 kg/m2

## 2024-01-13 NOTE — Assessment & Plan Note (Signed)
 I'm proved though requests muscle relaxant to use if needed for flare/recurrence C/o LLe pain and tingling intermittently, encouraged exercise and weight loss, nored flags present

## 2024-01-13 NOTE — Assessment & Plan Note (Signed)
  Patient re-educated about  the importance of commitment to a  minimum of 150 minutes of exercise per week as able.  The importance of healthy food choices with portion control discussed, as well as eating regularly and within a 12 hour window most days. The need to choose "clean , green" food 50 to 75% of the time is discussed, as well as to make water  the primary drink and set a goal of 64 ounces water  daily.       01/13/2024   10:04 AM 12/07/2023    2:06 PM 10/21/2023    2:16 PM  Weight /BMI  Weight 188 lb 1.3 oz 192 lb 3.2 oz 195 lb  Height 5' 1.5" (1.562 m) 5\' 1"  (1.549 m) 5\' 1"  (1.549 m)  BMI 34.96 kg/m2 36.32 kg/m2 36.84 kg/m2    Improvinfg which is greAT

## 2024-01-13 NOTE — Assessment & Plan Note (Signed)
 Updated lab needed at/ before next visit.

## 2024-01-13 NOTE — Assessment & Plan Note (Signed)
 Increased discomfort and difficulty fitting shoes, podiatry consult

## 2024-01-13 NOTE — Progress Notes (Signed)
 Desiree [name]     MRN: [id number]      DOB: 12/13/1945  Chief Complaint  Patient presents with   Hypertension    6 month follow up     HPI Desiree [name] is here for follow up and re-evaluation of chronic medical conditions, medication management and review of any available recent lab and radiology data.  Preventive health is updated, specifically  Cancer screening and Immunization.   Questions or concerns regarding consultations or procedures which the PT has had in the interim are  addressed. The PT denies any adverse reactions to current medications since the last visit.  C/o left hammer toe increasingly painful, wants help but not surgery if possible Has not been exercising as before but will resume ROS Denies recent fever or chills. Denies sinus pressure, nasal congestion, ear pain or sore throat. Denies chest congestion, productive cough or wheezing. Denies chest pains, palpitations and leg swelling Denies abdominal pain, nausea, vomiting,diarrhea or constipation.   Denies dysuria, frequency, hesitancy or incontinence. . Denies headaches, seizures, numbness, or tingling. Denies depression, anxiety or insomnia. Denies skin break down or rash.   PE  BP 120/79   Pulse 70   Resp 18   Ht 5' 1.5" (1.562 m)   Wt 188 lb 1.3 oz (85.3 kg)   SpO2 98%   BMI 34.96 kg/m   Patient alert and oriented and in no cardiopulmonary distress.  HEENT: No facial asymmetry, EOMI,     Neck supple .  Chest: Clear to auscultation bilaterally.  CVS: S1, S2 no murmurs, no S3.Regular rate.  ABD: Soft non tender.   Ext: No edema  MS: Adequate  tough reduced ROM spine, shoulders, hips and knees.Left 2nd hammer toe deformity  Skin: Intact, no ulcerations or rash noted.  Psych: Good eye contact, normal affect. Memory intact not anxious or depressed appearing.  CNS: CN 2-12 intact, power,  normal throughout.no focal deficits noted.   Assessment & Plan  Hammer toe of left  foot Increased discomfort and difficulty fitting shoes, podiatry consult  Essential hypertension Controlled, no change in medication DASH diet and commitment to daily physical activity for a minimum of 30 minutes discussed and encouraged, as a part of hypertension management. The importance of attaining a healthy weight is also discussed.     01/13/2024   10:04 AM 12/07/2023    2:06 PM 10/21/2023    2:16 PM 07/29/2023    1:19 PM 07/29/2023   10:44 AM 07/16/2023   10:07 AM 06/12/2023    7:28 PM  BP/Weight  Systolic BP 120 103 117 144 152 127 129  Diastolic BP 79 66 72 77 82 78 79  Wt. (Lbs) 188.08 192.2 195  190.8 188.04   BMI 34.96 kg/m2 36.32 kg/m2 36.84 kg/m2  35.47 kg/m2 35.53 kg/m2        Hyperlipidemia Hyperlipidemia:Low fat diet discussed and encouraged.   Lipid Panel  Lab Results  Component Value Date   CHOL 166 07/13/2023   HDL 89 07/13/2023   LDLCALC 68 07/13/2023   TRIG 39 07/13/2023   CHOLHDL 1.9 07/13/2023     Updated lab needed at/ before next visit.   Left lumbar pain I'm proved though requests muscle relaxant to use if needed for flare/recurrence C/o LLe pain and tingling intermittently, encouraged exercise and weight loss, nored flags present  Morbid obesity (HCC)  Patient re-educated about  the importance of commitment to a  minimum of 150 minutes of exercise per week as able.  The importance of healthy food choices with portion control discussed, as well as eating regularly and within a 12 hour window most days. The need to choose "clean , green" food 50 to 75% of the time is discussed, as well as to make water  the primary drink and set a goal of 64 ounces water  daily.       01/13/2024   10:04 AM 12/07/2023    2:06 PM 10/21/2023    2:16 PM  Weight /BMI  Weight 188 lb 1.3 oz 192 lb 3.2 oz 195 lb  Height 5' 1.5" (1.562 m) 5\' 1"  (1.549 m) 5\' 1"  (1.549 m)  BMI 34.96 kg/m2 36.32 kg/m2 36.84 kg/m2    Improvinfg which is greAT  Vitamin D   deficiency Updated lab needed at/ before next visit.

## 2024-01-13 NOTE — Assessment & Plan Note (Signed)
 Hyperlipidemia:Low fat diet discussed and encouraged.   Lipid Panel  Lab Results  Component Value Date   CHOL 166 07/13/2023   HDL 89 07/13/2023   LDLCALC 68 07/13/2023   TRIG 39 07/13/2023   CHOLHDL 1.9 07/13/2023     Updated lab needed at/ before next visit.

## 2024-01-13 NOTE — Patient Instructions (Addendum)
 Annual exam in 11/22 or after, call if you need me  sooner  Pls  sched mammogram at checkout  Labs today lipid,hepatic, TSH, cBC and vit D   Muscle relaxant is refilled  It is important that you exercise regularly at least 30 minutes 5 times a week. If you develop chest pain, have severe difficulty breathing, or feel very tired, stop exercising immediately and seek medical attention   Think about what you will eat, plan ahead. Choose " clean, green, fresh or frozen" over canned, processed or packaged foods which are more sugary, salty and fatty. 70 to 75% of food eaten should be vegetables and fruit. Three meals at set times with snacks allowed between meals, but they must be fruit or vegetables. Aim to eat over a 12 hour period , example 7 am to 7 pm, and STOP after  your last meal of the day. Drink water ,generally about 64 ounces per day, no other drink is as healthy. Fruit juice is best enjoyed in a healthy way, by EATING the fruit.   Thanks for choosing [facility], we consider it a privelige to serve you.

## 2024-01-14 ENCOUNTER — Other Ambulatory Visit: Payer: Self-pay

## 2024-01-14 ENCOUNTER — Ambulatory Visit: Payer: Self-pay | Admitting: Family Medicine

## 2024-01-14 DIAGNOSIS — R7989 Other specified abnormal findings of blood chemistry: Secondary | ICD-10-CM

## 2024-01-14 LAB — CBC WITH DIFFERENTIAL/PLATELET
Basophils Absolute: 0 10*3/uL (ref 0.0–0.2)
Basos: 1 %
EOS (ABSOLUTE): 0.1 10*3/uL (ref 0.0–0.4)
Eos: 2 %
Hematocrit: 31.8 % — ABNORMAL LOW (ref 34.0–46.6)
Hemoglobin: 10.5 g/dL — ABNORMAL LOW (ref 11.1–15.9)
Immature Grans (Abs): 0 10*3/uL (ref 0.0–0.1)
Immature Granulocytes: 0 %
Lymphocytes Absolute: 1.3 10*3/uL (ref 0.7–3.1)
Lymphs: 24 %
MCH: 28.6 pg (ref 26.6–33.0)
MCHC: 33 g/dL (ref 31.5–35.7)
MCV: 87 fL (ref 79–97)
Monocytes Absolute: 0.4 10*3/uL (ref 0.1–0.9)
Monocytes: 7 %
Neutrophils Absolute: 3.5 10*3/uL (ref 1.4–7.0)
Neutrophils: 66 %
Platelets: 342 10*3/uL (ref 150–450)
RBC: 3.67 x10E6/uL — ABNORMAL LOW (ref 3.77–5.28)
RDW: 13.2 % (ref 11.7–15.4)
WBC: 5.4 10*3/uL (ref 3.4–10.8)

## 2024-01-14 LAB — LIPID PANEL
Chol/HDL Ratio: 1.9 ratio (ref 0.0–4.4)
Cholesterol, Total: 168 mg/dL (ref 100–199)
HDL: 87 mg/dL (ref >39)
LDL Chol Calc (NIH): 71 mg/dL (ref 0–99)
Triglycerides: 44 mg/dL (ref 0–149)
VLDL Cholesterol Cal: 10 mg/dL (ref 5–40)

## 2024-01-14 LAB — HEPATIC FUNCTION PANEL
ALT: 13 IU/L (ref 0–32)
AST: 25 IU/L (ref 0–40)
Albumin: 4.2 g/dL (ref 3.8–4.8)
Alkaline Phosphatase: 106 IU/L (ref 44–121)
Bilirubin Total: 0.5 mg/dL (ref 0.0–1.2)
Bilirubin, Direct: 0.19 mg/dL (ref 0.00–0.40)
Total Protein: 6.9 g/dL (ref 6.0–8.5)

## 2024-01-14 LAB — TSH: TSH: 4.61 u[IU]/mL — ABNORMAL HIGH (ref 0.450–4.500)

## 2024-01-14 LAB — VITAMIN D 25 HYDROXY (VIT D DEFICIENCY, FRACTURES): Vit D, 25-Hydroxy: 34.5 ng/mL (ref 30.0–100.0)

## 2024-01-16 LAB — T3, FREE: T3, Free: 2.2 pg/mL (ref 2.0–4.4)

## 2024-01-16 LAB — SPECIMEN STATUS REPORT

## 2024-01-16 LAB — T4, FREE: Free T4: 1.1 ng/dL (ref 0.82–1.77)

## 2024-01-17 ENCOUNTER — Ambulatory Visit: Payer: Self-pay | Admitting: Family Medicine

## 2024-01-21 DIAGNOSIS — D638 Anemia in other chronic diseases classified elsewhere: Secondary | ICD-10-CM | POA: Diagnosis not present

## 2024-01-21 DIAGNOSIS — I129 Hypertensive chronic kidney disease with stage 1 through stage 4 chronic kidney disease, or unspecified chronic kidney disease: Secondary | ICD-10-CM | POA: Diagnosis not present

## 2024-01-21 DIAGNOSIS — I712 Thoracic aortic aneurysm, without rupture, unspecified: Secondary | ICD-10-CM | POA: Diagnosis not present

## 2024-01-21 DIAGNOSIS — N1832 Chronic kidney disease, stage 3b: Secondary | ICD-10-CM | POA: Diagnosis not present

## 2024-01-27 ENCOUNTER — Ambulatory Visit (INDEPENDENT_AMBULATORY_CARE_PROVIDER_SITE_OTHER)

## 2024-01-27 ENCOUNTER — Encounter: Payer: Self-pay | Admitting: Podiatry

## 2024-01-27 ENCOUNTER — Ambulatory Visit (INDEPENDENT_AMBULATORY_CARE_PROVIDER_SITE_OTHER): Admitting: Podiatry

## 2024-01-27 DIAGNOSIS — M7752 Other enthesopathy of left foot: Secondary | ICD-10-CM

## 2024-01-27 DIAGNOSIS — M2042 Other hammer toe(s) (acquired), left foot: Secondary | ICD-10-CM

## 2024-01-27 NOTE — Progress Notes (Signed)
  Subjective:  Patient ID: Desiree [name], female    DOB: Jun 15, 1946,   MRN: [id number]  Chief Complaint  Patient presents with   Foot Pain    Rm 21 Patient is here for pain in left index toe, painful when wearing closed-toed shoes. Pain presented six month ago. Pt. takes tylenol  for pain. Possible callus on left index.    78 y.o. female presents for concern as above.  . Denies any other pedal complaints. Denies n/v/f/c.   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 CFT <3 seconds. Normal hair growth on digits. No edema.  Skin. No lacerations or abrasions bilateral feet.  Musculoskeletal: MMT 5/5 bilateral lower extremities in DF, PF, Inversion and Eversion. Deceased ROM in DF of ankle joint. Hammered second digit on left with tenderness to dorsum of PIPJ. Rigid deformity and unable to reduce.   Neurological: Sensation intact to light touch.   Assessment:   1. 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 -X-rays reviewed. Hammered second digit noted. No acute fractures or  dislocations noted.  n -Educated on hammertoes and treatment options  -Discussed padding including toe caps and crest pads.  -Discussed need for potential surgery if pain does not improved.  -Patient to follow-up as needed. Discussed calling if any changes or increased pain.    [name], DPM

## 2024-03-14 ENCOUNTER — Ambulatory Visit (HOSPITAL_COMMUNITY)

## 2024-03-16 ENCOUNTER — Emergency Department (HOSPITAL_COMMUNITY)

## 2024-03-16 ENCOUNTER — Encounter (HOSPITAL_COMMUNITY): Payer: Self-pay | Admitting: Emergency Medicine

## 2024-03-16 ENCOUNTER — Emergency Department (HOSPITAL_COMMUNITY)
Admission: EM | Admit: 2024-03-16 | Discharge: 2024-03-16 | Disposition: A | Discharge status: Home / Self Care | Attending: Emergency Medicine | Admitting: Emergency Medicine

## 2024-03-16 ENCOUNTER — Other Ambulatory Visit: Payer: Self-pay

## 2024-03-16 DIAGNOSIS — M7989 Other specified soft tissue disorders: Secondary | ICD-10-CM | POA: Diagnosis not present

## 2024-03-16 DIAGNOSIS — M79672 Pain in left foot: Secondary | ICD-10-CM | POA: Diagnosis not present

## 2024-03-16 DIAGNOSIS — Z7982 Long term (current) use of aspirin: Secondary | ICD-10-CM | POA: Insufficient documentation

## 2024-03-16 DIAGNOSIS — M25472 Effusion, left ankle: Secondary | ICD-10-CM | POA: Diagnosis not present

## 2024-03-16 DIAGNOSIS — R2242 Localized swelling, mass and lump, left lower limb: Secondary | ICD-10-CM | POA: Diagnosis not present

## 2024-03-16 DIAGNOSIS — M25572 Pain in left ankle and joints of left foot: Secondary | ICD-10-CM | POA: Diagnosis not present

## 2024-03-16 DIAGNOSIS — M25475 Effusion, left foot: Secondary | ICD-10-CM | POA: Diagnosis not present

## 2024-03-16 MED ORDER — ACETAMINOPHEN 325 MG PO TABS
650.0000 mg | ORAL_TABLET | Freq: Once | ORAL | Status: AC
Start: 2024-03-16 — End: 2024-03-16
  Administered 2024-03-16: 650 mg via ORAL
  Filled 2024-03-16: qty 2

## 2024-03-16 NOTE — Discharge Instructions (Signed)
 Keep your leg elevated above the level of your heart and apply ice.  You may take over-the-counter medicines to help with pain.  Return to the ER tomorrow for a DVT ultrasound.  If you develop weakness or numbness in the foot or leg, trouble breathing, fever, or any other new/concerning symptoms then return to the ER.  IMPORTANT PATIENT INSTRUCTIONS:  Your ED provider has recommended an Outpatient Ultrasound.  Please call [phone] to schedule an appointment.  If your appointment is scheduled for a Saturday, Sunday or holiday, please go to the [facility] Emergency Department Registration Desk at least 15 minutes prior to your appointment time and tell them you are there for an ultrasound.    If your appointment is scheduled for a weekday (Monday-Friday), please go directly to the [facility] Radiology Department at least 15 minutes prior to your appointment time and tell them you are there for an ultrasound.  Please call [phone] with questions.

## 2024-03-16 NOTE — ED Provider Notes (Signed)
 [place] EMERGENCY DEPARTMENT AT [facility] Provider Note   CSN: [id number] Arrival date & time: 03/16/24  2105     Patient presents with: Foot Pain   Desiree [name] is a 78 y.o. female.   HPI 78 year old female presents with atraumatic left foot and ankle swelling and pain.  Started a few hours ago this afternoon.  No obvious injury that she can remember.  No chest pain or shortness of breath.  Seem to start at rest.  No calf pain or swelling.  No chest pain, shortness of breath, fever.  No prior history of gout.  It is painful to put weight on her foot.  No numbness in her foot.  Pain is diffuse throughout her foot and ankle.  Prior to Admission medications   Medication Sig Start Date End Date Taking? Authorizing Provider  acetaminophen  (TYLENOL ) 500 MG tablet Take one to  two tablets by mouth at night for pain 02/20/20   [name], MD  Ascorbic Acid  (VITAMIN C) 1000 MG tablet Take 1,000 mg by mouth daily.    [provider]  aspirin  EC 325 MG tablet Take 1 tablet (325 mg total) by mouth daily. 02/21/13   [name], MD  carvedilol  (COREG ) 3.125 MG tablet TAKE 1 TABLET(3.125 MG) BY MOUTH TWICE DAILY WITH A MEAL 04/13/23   Croitoru, Mihai, MD  chlorthalidone  (HYGROTON ) 50 MG tablet Take 1 tablet (50 mg total) by mouth daily. 01/13/24   [name], MD  Cholecalciferol (VITAMIN D ) 50 MCG (2000 UT) tablet Take 2,000 Units by mouth daily.     [provider]  Ferrous Sulfate  (SLOW FE PO) Take 1 capsule by mouth every other day.    [provider]  loratadine  (CLARITIN ) 10 MG tablet Take one tablet by mouth  one to two times daily as needed for uncontrolled allergies 07/16/23   [name], MD  losartan  (COZAAR ) 50 MG tablet Take 1 tablet (50 mg total) by mouth daily. 09/16/23   [name], MD  lovastatin  (MEVACOR ) 40 MG tablet Take 1 tablet (40 mg total) by mouth daily. 07/16/23   [name], MD   montelukast  (SINGULAIR ) 10 MG tablet Take 10 mg by mouth at bedtime.    [provider]  Multiple Vitamin (MULTIVITAMIN) tablet Take 1 tablet by mouth daily. One a day woman's    [provider]  terbinafine  (LAMISIL ) 1 % cream Apply twice daily to rash on scalp for 1 week, then , as needed 06/13/22   [name], MD  tiZANidine  (ZANAFLEX ) 2 MG tablet Take 1 tablet (2 mg total) by mouth every 8 (eight) hours as needed for muscle spasms. 01/13/24   [name], MD  zinc  gluconate 50 MG tablet Take 50 mg by mouth daily.    [provider]    Allergies: Codeine and Quinolones    Review of Systems  Musculoskeletal:  Positive for arthralgias and joint swelling.    Updated Vital Signs BP (!) 141/80 (BP Location: Left Arm)   Pulse 63   Temp 98.1 F (36.7 C) (Oral)   Resp 16   Ht 5' 1.5 (1.562 m)   Wt 86.2 kg   SpO2 93%   BMI 35.32 kg/m   Physical Exam Vitals and nursing note reviewed.  Constitutional:      Appearance: She is well-developed.  HENT:     Head: Normocephalic.  Cardiovascular:     Rate and Rhythm: Normal rate  and regular rhythm.     Pulses:          Dorsalis pedis pulses are 2+ on the right side and 2+ on the left side.  Pulmonary:     Effort: Pulmonary effort is normal.  Musculoskeletal:     Left lower leg: No swelling or tenderness.     Left ankle: Swelling present. No deformity, ecchymosis or lacerations. Tenderness present. Normal range of motion.     Left foot: Normal range of motion. Swelling (proximal) and tenderness present.  Skin:    General: Skin is warm and dry.  Neurological:     Mental Status: She is alert.     (all labs ordered are listed, but only abnormal results are displayed) Labs Reviewed - No data to display  EKG: None  Radiology: DG Ankle Complete Left Result Date: 03/16/2024 EXAM: 3 or more VIEW(S) XRAY OF THE LEFT ANKLE 03/16/2024 09:43:00 PM CLINICAL HISTORY: Swelling and pain worsening  since 3 pm today. COMPARISON: None available. FINDINGS: BONES AND JOINTS: No acute fracture. No focal osseous lesion. No joint dislocation. SOFT TISSUES: Mild lateral soft tissue swelling. IMPRESSION: 1. No acute osseous abnormality. 2. Mild lateral soft tissue swelling. Electronically signed by: Pinkie Pebbles MD 03/16/2024 09:46 PM EDT RP Workstation: [id number]   DG Foot Complete Left Result Date: 03/16/2024 EXAM: 3 VIEW(S) XRAY OF THE LEFT FOOT 03/16/2024 09:39:00 PM COMPARISON: None available. CLINICAL HISTORY: Pain. Since 3 pm: persistent foot pain (no injury patient states) FINDINGS: BONES AND JOINTS: No acute fracture. No focal osseous lesion. No joint dislocation. SOFT TISSUES: Mild dorsal soft tissue swelling along the forefoot. IMPRESSION: 1. No fracture or dislocation. 2. Mild dorsal soft tissue swelling. Electronically signed by: Pinkie Pebbles MD 03/16/2024 09:41 PM EDT RP Workstation: [id number]     Procedures   Medications Ordered in the ED  acetaminophen  (TYLENOL ) tablet 650 mg (has no administration in time range)                                    Medical Decision Making Amount and/or Complexity of Data Reviewed Radiology: ordered and independent interpretation performed.    Details: No fractures  Risk OTC drugs.   X-rays were obtained and are negative for fracture.  Exam is not consistent with cellulitis.  I doubt gout.  Otherwise, is well-appearing with stable vitals.  No signs or symptoms of PE.  She is concerned about DVT which is reasonable to get an ultrasound to rule out, however this is not available at this time at night in this facility, so she will need to come back tomorrow morning.  Otherwise I think she is stable to wait.  Will give Tylenol  for pain.  Advised her to elevate her leg and apply ice.  Will give return precautions.     Final diagnoses:  Left ankle swelling    ED Discharge Orders          Ordered    US  Venous Img Lower Unilateral  Left        03/16/24 2214               Freddi Hamilton, MD 03/16/24 2227

## 2024-03-16 NOTE — ED Triage Notes (Signed)
 Pt via POV c/o left anterior ankle and left foot pain since 3pm today. No CP/SOB. Pt is concerned that she may have a blood clot; no prior hx of same. NAD in triage. Takes aspirin  daily.

## 2024-03-17 NOTE — Progress Notes (Unsigned)
 Referring Provider: [name], MD Primary Care Physician:  [name], MD Primary GI Physician: Dr. Cindie  Chief Complaint  Patient presents with   Follow-up    Follow up. No problems     HPI:   Desiree [name] is a 78 y.o. female with history of chronic normocytic anemia with baseline hemoglobin in 10-11 range, CKD, HTN, HLD, CVA, breast cancer, presenting today to discuss scheduling colonoscopy.  She has no history of polyps.  Her last colonoscopy was in 2015.   Reviewed most recent labs 01/13/2024.  Hemoglobin fairly stable at 10.5.  Today:  No GI concerns.  Denies BRBPR, melena, change in bowel habits, unintentional weight loss, abdominal pain, nausea, vomiting, heartburn, dysphagia.  Takes iron every now and then.  No Fhx of colon cancer. Brother with colon polyps.    Prior endoscopic evaluation: EGD: Mild gastritis/duodenitis s/p biopsied, no obvious source for anemia.  Pathology with mild chronic gastritis with intestinal metaplasia, negative for H. pylori, duodenal biopsy benign.  Colonoscopy: Normal TI, mild diverticulosis in sigmoid and transverse colon, small external hemorrhoids, no obvious source for anemia.  Repeat colonoscopy in 10-15 years if benefits outweigh risk.  Givens capsule 03/13/2014: Normal exam.  Suspected anemia most likely due to chronic disease.    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--  S/P  RIGHT MASTECTMOY AND RADIATION--  NO RECURRANCE   History of CVA (cerebrovascular accident) PER SCAN IN 2011   Hyperlipidemia    Hypertension    Impaired fasting  glucose PER PCP  DR ANTONETTA   WATCH DIET   Mixed urge and stress incontinence    Nocturia    Sinus drainage    Thoracic ascending aortic aneurysm (HCC) LAST CHEST CT 09-02-2010--  FOLLOWED BY  CARDIOLOGIST--  DR [id number]   Vaginal wall prolapse     Past Surgical History:  Procedure Laterality Date   ABDOMINAL HYSTERECTOMY     BILATERAL CARPAL TUNNEL RELEASE  1980'S   CARDIAC CATHETERIZATION  02-01-2009  DR EICHHORN   NORMAL CORONARY ARTERIES/ NORMAL LV SIZE AND FUNCTION/ AORTIC ROOT SIZE AT THE UPPER LIMIT OF NORMAL    CARPAL TUNNEL RELEASE     CERVICAL DISC SURGERY  12-12-2005  DR UNICE   LEFT  C6 - C7 HERINATED / DDD/ SPONDYLOSIS   COLONOSCOPY N/A 02/10/2014   Surgeon: Margo LITTIE Haddock, MD; Normal TI, mild diverticulosis in sigmoid and transverse colon, small external hemorrhoids, no obvious source for anemia.  Repeat colonoscopy in 10-15 years if benefits outweigh risk.   CYSTOCELE REPAIR  08/02/2012   Procedure: ANTERIOR REPAIR (CYSTOCELE);  Surgeon: Arlena LILLETTE Gal, MD;  Location: [facility];  Service: Urology;  Laterality: N/A;  [employer] Uphold Anterior Pelvic Floor Sacrospinus Repair. Anterior wall of the vagina.   ESOPHAGOGASTRODUODENOSCOPY N/A 02/10/2014   Surgeon: Margo LITTIE Haddock, MD;   Mild gastritis/duodenitis s/p biopsied, no obvious source for anemia.  Pathology with mild chronic gastritis with intestinal metaplasia, negative for H. pylori, duodenal biopsy benign.   GIVENS CAPSULE STUDY N/A 03/13/2014  Surgeon: Margo LITTIE Haddock, MD;  Normal exam.  Suspected anemia most likely due to chronic disease.   JOINT REPLACEMENT N/A    Phreesia 06/16/2020   NECK SURGERY  2007   RIGHT PARTIAL MASTECTOMY  11-11-2004  DR PETER YOUNG   DUCTAL CARCINOMA IN SITU RIGHT BREAST   TOTAL ABDOMINAL HYSTERECTOMY W/ BILATERAL SALPINGOOPHORECTOMY  1990   TOTAL KNEE ARTHROPLASTY Right 12/21/2019   Procedure: RIGHT  TOTAL KNEE ARTHROPLASTY;  Surgeon: Barbarann Oneil BROCKS, MD;   Location: [facility] OR;  Service: Orthopedics;  Laterality: Right;   TRANSTHORACIC ECHOCARDIOGRAM  03-30-2008  DR MARGARET SIMPSON   LV SIZE AND FUNCTION NORMAL/ MODERATE AORTIC ARCH DILATATION/ MILD MR    Current Outpatient Medications  Medication Sig Dispense Refill   acetaminophen  (TYLENOL ) 500 MG tablet Take one to  two tablets by mouth at night for pain 50 tablet 0   Ascorbic Acid  (VITAMIN C) 1000 MG tablet Take 1,000 mg by mouth daily.     aspirin  EC 325 MG tablet Take 1 tablet (325 mg total) by mouth daily. 100 tablet 3   carvedilol  (COREG ) 3.125 MG tablet TAKE 1 TABLET(3.125 MG) BY MOUTH TWICE DAILY WITH A MEAL 180 tablet 3   chlorthalidone  (HYGROTON ) 50 MG tablet Take 1 tablet (50 mg total) by mouth daily. 90 tablet 3   Cholecalciferol (VITAMIN D ) 50 MCG (2000 UT) tablet Take 2,000 Units by mouth daily.      Ferrous Sulfate  (SLOW FE PO) Take 1 capsule by mouth every other day.     loratadine  (CLARITIN ) 10 MG tablet Take one tablet by mouth  one to two times daily as needed for uncontrolled allergies 90 tablet 2   losartan  (COZAAR ) 50 MG tablet Take 1 tablet (50 mg total) by mouth daily. 90 tablet 3   lovastatin  (MEVACOR ) 40 MG tablet Take 1 tablet (40 mg total) by mouth daily. 90 tablet 2   montelukast  (SINGULAIR ) 10 MG tablet Take 10 mg by mouth at bedtime.     Multiple Vitamin (MULTIVITAMIN) tablet Take 1 tablet by mouth daily. One a day woman's     terbinafine  (LAMISIL ) 1 % cream Apply twice daily to rash on scalp for 1 week, then , as needed 42 g 0   tiZANidine  (ZANAFLEX ) 2 MG tablet Take 1 tablet (2 mg total) by mouth every 8 (eight) hours as needed for muscle spasms. 30 tablet 0   zinc  gluconate 50 MG tablet Take 50 mg by mouth daily.     No current facility-administered medications for this visit.    Allergies as of 03/18/2024 - Review Complete 03/18/2024  Allergen Reaction Noted   Codeine Other (See Comments) 09/20/2020   Quinolones Other (See Comments) 08/19/2018    Family  History  Problem Relation Age of Onset   Kidney failure Mother    Hypertension Mother    Heart attack Mother 3   Throat cancer Father    Prostate cancer Father    Lung cancer Father 80   Breast cancer Sister    Hypertension Brother        2 stents   Colon cancer Neg Hx    Colon polyps Neg Hx     Social History   Socioeconomic History   Marital status: Divorced    Spouse name: Not on file   Number of children: 4   Years of education: 12+   Highest education level: Some college, no degree  Occupational History   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  Tobacco Use   Smoking status: Never   Smokeless tobacco: Never  Vaping Use   Vaping status: Never Used  Substance and Sexual Activity   Alcohol  use: No   Drug use: No   Sexual activity: Never  Other Topics Concern   Not on file  Social History Narrative   RETIRED IN June 2020.    Social Drivers of Corporate investment banker Strain: Low Risk  (07/05/2021)   Overall Financial Resource Strain (CARDIA)    Difficulty of Paying Living Expenses: Not hard at all  Food Insecurity: No Food Insecurity (07/05/2021)   Hunger Vital Sign    Worried About Running Out of Food in the Last Year: Never true    Ran Out of Food in the Last Year: Never true  Transportation Needs: No Transportation Needs (07/05/2021)   PRAPARE - Administrator, Civil Service (Medical): No    Lack of Transportation (Non-Medical): No  Physical Activity: Insufficiently Active (07/05/2021)   Exercise Vital Sign    Days of Exercise per Week: 3 days    Minutes of Exercise per Session: 30 min  Stress: No Stress Concern Present (06/19/2020)   [employer] of Occupational Health - Occupational Stress Questionnaire    Feeling of Stress : Not at all  Social Connections: Moderately Integrated (07/05/2021)   Social Connection and Isolation Panel    Frequency of Communication with Friends and Family:  More than three times a week    Frequency of Social Gatherings with Friends and Family: More than three times a week    Attends Religious Services: More than 4 times per year    Active Member of [employer] or Organizations: Yes    Attends Engineer, structural: More than 4 times per year    Marital Status: Divorced    Review of Systems: Gen: Denies fever, chills, cold or flulike symptoms, presyncope, syncope. CV: Denies chest pain, palpitations. Resp: Denies dyspnea, cough. GI: See HPI Heme: See HPI  Physical Exam: BP 106/69 (BP Location: Right Arm, Patient Position: Sitting, Cuff Size: Large)   Pulse 78   Temp 97.7 F (36.5 C) (Temporal)   Ht 5' 1.5 (1.562 m)   Wt 192 lb 9.6 oz (87.4 kg)   BMI 35.80 kg/m  General:   Alert and oriented. No distress noted. Pleasant and cooperative.  Head:  Normocephalic and atraumatic. Eyes:  Conjuctiva clear without scleral icterus. Heart:  S1, S2 present without murmurs appreciated. Lungs:  Clear to auscultation bilaterally. No wheezes, rales, or rhonchi. No distress.  Abdomen:  +BS, soft, non-tender and non-distended. No rebound or guarding. No HSM or masses noted. Msk:  Symmetrical without gross deformities. Normal posture. Extremities:  Without edema. Neurologic:  Alert and  oriented x4 Psych:  Normal mood and affect.    Assessment:  78 y.o. female with history of chronic normocytic anemia with baseline hemoglobin in 10-11 range, CKD, HTN, HLD, CVA, breast cancer, presenting today to discuss scheduling colonoscopy.   Colon cancer screening: Due for routine screening.  Last colonoscopy in June 2015 with normal exam.  Currently without any significant lower GI symptoms.  No alarm symptoms.  No family history of colon cancer.  Brother with history of polyps.   Plan:  Proceed with colonoscopy with propofol  by Dr. Cindie in near future. The risks, benefits, and alternatives have been discussed with the patient in detail. The patient  states understanding and desires to proceed.  ASA 3 but ok to schedule in Endo room 1 or 2.  Change 325 mg aspirin  to 81 mg aspirin  daily for 5 days prior to procedure. Hold iron for 7 days prior to procedure. Follow-up as Dr. Cindie recommends.   Josette Centers, PA-C [facility] 03/18/2024

## 2024-03-18 ENCOUNTER — Ambulatory Visit (INDEPENDENT_AMBULATORY_CARE_PROVIDER_SITE_OTHER): Admitting: Gastroenterology

## 2024-03-18 ENCOUNTER — Encounter: Payer: Self-pay | Admitting: Gastroenterology

## 2024-03-18 VITALS — BP 106/69 | HR 78 | Temp 97.7°F | Ht 61.5 in | Wt 192.6 lb

## 2024-03-18 DIAGNOSIS — Z1211 Encounter for screening for malignant neoplasm of colon: Secondary | ICD-10-CM

## 2024-03-18 DIAGNOSIS — Z01818 Encounter for other preprocedural examination: Secondary | ICD-10-CM | POA: Diagnosis not present

## 2024-03-18 NOTE — Patient Instructions (Addendum)
 Will get you scheduled for a colonoscopy in the near future with Dr. Cindie. You will need to hold iron for 7 days prior to your procedure. You will also need to decrease your aspirin  to 81 mg daily for 5 days prior to your colonoscopy to limit bleeding risk.  We will see you back in the office as Dr. Cindie recommends.  It was good to see you today!   Josette Centers, PA-C [facility] Gastroenterology

## 2024-03-21 ENCOUNTER — Ambulatory Visit: Payer: Self-pay

## 2024-03-21 ENCOUNTER — Ambulatory Visit (HOSPITAL_COMMUNITY)
Admission: RE | Admit: 2024-03-21 | Discharge: 2024-03-21 | Disposition: A | Discharge status: Home / Self Care | Source: Ambulatory Visit | Attending: Emergency Medicine | Admitting: Emergency Medicine

## 2024-03-21 ENCOUNTER — Encounter: Payer: Self-pay | Admitting: *Deleted

## 2024-03-21 ENCOUNTER — Other Ambulatory Visit: Payer: Self-pay | Admitting: *Deleted

## 2024-03-21 DIAGNOSIS — R600 Localized edema: Secondary | ICD-10-CM | POA: Insufficient documentation

## 2024-03-21 MED ORDER — PEG 3350-KCL-NA BICARB-NACL 420 G PO SOLR: 4000.0000 mL | Freq: Once | ORAL | 0 refills | Status: AC

## 2024-03-21 NOTE — Telephone Encounter (Signed)
 Pt still having intermittent pain/swelling. Needs appt for ED f/u, next available is 8/8, protocol recommends being seen in 3 days.   FYI Only or Action Required?: Action required by provider: request for appointment.  Patient was last seen in primary care on 01/13/2024 by [name], MD.  Called Nurse Triage reporting Foot Pain.  Symptoms began a week ago.  Interventions attempted: Rest, hydration, or home remedies.  Symptoms are: unchanged.  Triage Disposition: See PCP When Office is Open (Within 3 Days)  Patient/caregiver understands and will follow disposition?: Yes, will follow disposition  Copied from CRM [id number]. Topic: Clinical - Red Word Triage >> Mar 21, 2024  2:37 PM Jasmin G wrote: Red Word that prompted transfer to Nurse Triage: Pt had been experiencing swelling on her left feet, she went to the ER on Wednesday and was checked for blood cloths, and she has an ultrasound today but they could not check for gout, atm she's still experiencing tingling on her toes. Reason for Disposition  [1] MODERATE pain (e.g., interferes with normal activities, limping) AND [2] present > 3 days  Answer Assessment - Initial Assessment Questions 1. ONSET: When did the pain start?      5-6 days ago 2. LOCATION: Where is the pain located?      L foot 3. PAIN: How bad is the pain?    (Scale 1-10; or mild, moderate, severe)     No pain at time of call 4. WORK OR EXERCISE: Has there been any recent work or exercise that involved this part of the body?      denies 5. CAUSE: What do you think is causing the foot pain?     Gout potentially 6. OTHER SYMPTOMS: Do you have any other symptoms? (e.g., leg pain, rash, fever, numbness)     Tingling,   Pt was seen in the ED when this started, states that they advised her to f/u with PCP for gout testing.  Protocols used: Foot Pain-A-AH

## 2024-03-22 NOTE — Telephone Encounter (Signed)
Patient scheduled/ added to wait list

## 2024-03-23 ENCOUNTER — Ambulatory Visit (HOSPITAL_COMMUNITY)
Admission: RE | Admit: 2024-03-23 | Discharge: 2024-03-23 | Disposition: A | Discharge status: Home / Self Care | Source: Ambulatory Visit | Attending: Family Medicine | Admitting: Family Medicine

## 2024-03-23 DIAGNOSIS — Z1231 Encounter for screening mammogram for malignant neoplasm of breast: Secondary | ICD-10-CM | POA: Insufficient documentation

## 2024-03-25 ENCOUNTER — Ambulatory Visit: Payer: Self-pay | Admitting: Family Medicine

## 2024-03-25 ENCOUNTER — Other Ambulatory Visit (HOSPITAL_COMMUNITY): Payer: Self-pay | Admitting: Family Medicine

## 2024-03-25 DIAGNOSIS — R928 Other abnormal and inconclusive findings on diagnostic imaging of breast: Secondary | ICD-10-CM

## 2024-03-29 ENCOUNTER — Ambulatory Visit (INDEPENDENT_AMBULATORY_CARE_PROVIDER_SITE_OTHER): Admitting: Family Medicine

## 2024-03-29 ENCOUNTER — Encounter: Payer: Self-pay | Admitting: Family Medicine

## 2024-03-29 VITALS — BP 104/65 | HR 72 | Resp 16 | Ht 61.5 in | Wt 190.1 lb

## 2024-03-29 DIAGNOSIS — Z09 Encounter for follow-up examination after completed treatment for conditions other than malignant neoplasm: Secondary | ICD-10-CM

## 2024-03-29 DIAGNOSIS — M25472 Effusion, left ankle: Secondary | ICD-10-CM | POA: Diagnosis not present

## 2024-03-29 DIAGNOSIS — M25473 Effusion, unspecified ankle: Secondary | ICD-10-CM

## 2024-03-29 DIAGNOSIS — I10 Essential (primary) hypertension: Secondary | ICD-10-CM

## 2024-03-29 DIAGNOSIS — D649 Anemia, unspecified: Secondary | ICD-10-CM

## 2024-03-29 DIAGNOSIS — E785 Hyperlipidemia, unspecified: Secondary | ICD-10-CM

## 2024-03-29 NOTE — Progress Notes (Signed)
 Desiree [name]     MRN: [id number]      DOB: 06/29/1946  Chief Complaint  Patient presents with   Follow-up    Seen in ED 7/23 for ankle swelling     HPI Desiree [name] is here for follow up of recent ED visit for ankle swelling, symptom has greatly improved without additional medicaion. She denied ever having PND, orthopnea or reduced exercise tolerance, also no chest pain ROS Denies recent fever or chills. Denies sinus pressure, nasal congestion, ear pain or sore throat. Denies chest congestion, productive cough or wheezing. Denies chest pains, palpitations and leg swelling Denies abdominal pain, nausea, vomiting,diarrhea or constipation.   Denies dysuria, frequency, hesitancy or incontinence.  Denies headaches, seizures, numbness, or tingling. Denies depression, anxiety or insomnia. Denies skin break down or rash.   PE  BP 104/65   Pulse 72   Resp 16   Ht 5' 1.5 (1.562 m)   Wt 190 lb 1.3 oz (86.2 kg)   SpO2 95%   BMI 35.33 kg/m   Patient alert and oriented and in no cardiopulmonary distress.  HEENT: No facial asymmetry, EOMI,     Neck supple .  Chest: Clear to auscultation bilaterally.  CVS: S1, S2 no murmurs, no S3.Regular rate.  ABD: Soft non tender.   Ext: No edema  MS: Adequate though reduced  ROM spine, shoulders, hips and knees.Ankle mildly swollen and tender, no erythema or warmth  Skin: Intact, no ulcerations or rash noted.  Psych: Good eye contact, normal affect. Memory intact not anxious or depressed appearing.  CNS: CN 2-12 intact, power,  normal throughout.no focal deficits noted.   Assessment & Plan  Anemia Update levels to determine deficiency  Left ankle swelling Check uric acid level, advised ice and elevation and sodiium  restriction should it recur  Essential hypertension Controlled, no change in medication   Encounter for examination following treatment at hospital Patient in for follow up of recent ED visit Discharge summary,  and laboratory and radiology data are reviewed, and any questions or concerns  are discussed. Specific issues requiring follow up are specifically addressed.

## 2024-03-29 NOTE — Patient Instructions (Signed)
 Annual exam as before.  Labs today uric acid level CBC and anemia panel.  All the best with mammogram and upcoming colonoscopy.  Fasting lipid CMP and EGFR to be obtained 3 to 5 days before December visit.  It is important that you exercise regularly at least 30 minutes 5 times a week. If you develop chest pain, have severe difficulty breathing, or feel very tired, stop exercising immediately and seek medical attention   Think about what you will eat, plan ahead. Choose  clean, green, fresh or frozen over canned, processed or packaged foods which are more sugary, salty and fatty. 70 to 75% of food eaten should be vegetables and fruit. Three meals at set times with snacks allowed between meals, but they must be fruit or vegetables. Aim to eat over a 12 hour period , example 7 am to 7 pm, and STOP after  your last meal of the day. Drink water ,generally about 64 ounces per day, no other drink is as healthy. Fruit juice is best enjoyed in a healthy way, by EATING the fruit.   Thanks for choosing [facility] we consider it a privelige to serve you.

## 2024-04-02 LAB — ANEMIA PROFILE B
Basophils Absolute: 0 x10E3/uL (ref 0.0–0.2)
Basos: 1 %
EOS (ABSOLUTE): 0.2 x10E3/uL (ref 0.0–0.4)
Eos: 5 %
Ferritin: 307 ng/mL — ABNORMAL HIGH (ref 15–150)
Folate: 15.1 ng/mL (ref >3.0)
Hematocrit: 32.9 % — ABNORMAL LOW (ref 34.0–46.6)
Hemoglobin: 10.4 g/dL — ABNORMAL LOW (ref 11.1–15.9)
Immature Grans (Abs): 0 x10E3/uL (ref 0.0–0.1)
Immature Granulocytes: 0 %
Iron Saturation: 34 % (ref 15–55)
Iron: 97 ug/dL (ref 27–139)
Lymphocytes Absolute: 1.2 x10E3/uL (ref 0.7–3.1)
Lymphs: 23 %
MCH: 28.4 pg (ref 26.6–33.0)
MCHC: 31.6 g/dL (ref 31.5–35.7)
MCV: 90 fL (ref 79–97)
Monocytes Absolute: 0.3 x10E3/uL (ref 0.1–0.9)
Monocytes: 7 %
Neutrophils Absolute: 3.2 x10E3/uL (ref 1.4–7.0)
Neutrophils: 64 %
Platelets: 310 x10E3/uL (ref 150–450)
RBC: 3.66 x10E6/uL — ABNORMAL LOW (ref 3.77–5.28)
RDW: 13.6 % (ref 11.7–15.4)
Retic Ct Pct: 2.1 % (ref 0.6–2.6)
Total Iron Binding Capacity: 282 ug/dL (ref 250–450)
UIBC: 185 ug/dL (ref 118–369)
Vitamin B-12: >2000 pg/mL — ABNORMAL HIGH (ref 232–1245)
WBC: 4.9 x10E3/uL (ref 3.4–10.8)

## 2024-04-02 LAB — URIC ACID: Uric Acid: 5.5 mg/dL (ref 3.1–7.9)

## 2024-04-05 ENCOUNTER — Other Ambulatory Visit: Payer: Self-pay | Admitting: Cardiovascular Disease

## 2024-04-06 ENCOUNTER — Ambulatory Visit: Payer: Self-pay | Admitting: Family Medicine

## 2024-04-07 ENCOUNTER — Ambulatory Visit (HOSPITAL_COMMUNITY)
Admission: RE | Admit: 2024-04-07 | Discharge: 2024-04-07 | Disposition: A | Discharge status: Home / Self Care | Source: Ambulatory Visit | Attending: Family Medicine | Admitting: Family Medicine

## 2024-04-07 ENCOUNTER — Other Ambulatory Visit (HOSPITAL_COMMUNITY): Payer: Self-pay | Admitting: Family Medicine

## 2024-04-07 DIAGNOSIS — R928 Other abnormal and inconclusive findings on diagnostic imaging of breast: Secondary | ICD-10-CM | POA: Diagnosis not present

## 2024-04-07 DIAGNOSIS — R92321 Mammographic fibroglandular density, right breast: Secondary | ICD-10-CM | POA: Diagnosis not present

## 2024-04-07 DIAGNOSIS — N6314 Unspecified lump in the right breast, lower inner quadrant: Secondary | ICD-10-CM | POA: Diagnosis not present

## 2024-04-07 NOTE — Telephone Encounter (Signed)
 Patient aware of results and recommendations.  She takes a multivitiamin every now and then.  It has B12 25mg  but does not take a separate B12 supplement that would effect this blood work.

## 2024-04-07 NOTE — Telephone Encounter (Signed)
-----   Message from Rollene Pesa sent at 04/06/2024  2:46 PM EDT ----- Your blood count Is stable, your B12 is too high, stop supplement, iron is in good range and iron stores are high, one multivitamin like centrum daily is sufficient Uric acid level is  good ----- Message ----- From: Interface, [facility] Lab Results In Sent: 03/30/2024   6:36 AM EDT To: Rollene FORBES Pesa, MD

## 2024-04-08 ENCOUNTER — Ambulatory Visit: Payer: Self-pay | Admitting: Family Medicine

## 2024-04-13 ENCOUNTER — Other Ambulatory Visit (HOSPITAL_COMMUNITY): Payer: Self-pay | Admitting: Family Medicine

## 2024-04-13 DIAGNOSIS — R928 Other abnormal and inconclusive findings on diagnostic imaging of breast: Secondary | ICD-10-CM

## 2024-04-14 ENCOUNTER — Encounter (HOSPITAL_COMMUNITY): Payer: Self-pay

## 2024-04-14 ENCOUNTER — Inpatient Hospital Stay (HOSPITAL_COMMUNITY)
Admission: RE | Admit: 2024-04-14 | Discharge: 2024-04-14 | Discharge status: Home / Self Care | Source: Ambulatory Visit | Attending: Family Medicine | Admitting: Family Medicine

## 2024-04-14 ENCOUNTER — Ambulatory Visit (HOSPITAL_COMMUNITY)
Admission: RE | Admit: 2024-04-14 | Discharge: 2024-04-14 | Disposition: A | Discharge status: Home / Self Care | Source: Ambulatory Visit | Attending: Family Medicine | Admitting: Family Medicine

## 2024-04-14 ENCOUNTER — Telehealth: Payer: Self-pay

## 2024-04-14 DIAGNOSIS — R928 Other abnormal and inconclusive findings on diagnostic imaging of breast: Secondary | ICD-10-CM

## 2024-04-14 DIAGNOSIS — C50311 Malignant neoplasm of lower-inner quadrant of right female breast: Secondary | ICD-10-CM | POA: Diagnosis not present

## 2024-04-14 DIAGNOSIS — R921 Mammographic calcification found on diagnostic imaging of breast: Secondary | ICD-10-CM | POA: Diagnosis not present

## 2024-04-14 HISTORY — PX: BREAST BIOPSY: SHX20

## 2024-04-14 MED ORDER — LIDOCAINE HCL (PF) 2 % IJ SOLN
10.0000 mL | Freq: Once | INTRAMUSCULAR | Status: AC
  Administered 2024-04-14: 10 mL

## 2024-04-14 MED ORDER — LIDOCAINE HCL (PF) 2 % IJ SOLN
INTRAMUSCULAR | Status: AC
  Filled 2024-04-14: qty 10

## 2024-04-14 MED ORDER — LIDOCAINE-EPINEPHRINE (PF) 1 %-1:200000 IJ SOLN
10.0000 mL | Freq: Once | INTRAMUSCULAR | Status: AC
  Administered 2024-04-14: 10 mL via INTRADERMAL

## 2024-04-14 NOTE — Telephone Encounter (Signed)
 Lvm to cb

## 2024-04-14 NOTE — Progress Notes (Signed)
 PT tolerated right breast biopsy well today with NAD noted. PT verbalized understanding of discharge instructions. PT ambulated back to the mammogram area this time and given ice packs to use. Specimens taken to lab by Colette from ultrasound for processing.

## 2024-04-14 NOTE — Telephone Encounter (Signed)
 Patient returning call to Abby. Can be reached out to early in the morning tomorrow.    Requesting call back, [phone]

## 2024-04-14 NOTE — Telephone Encounter (Signed)
 Copied from CRM #[id number]. Topic: Clinical - Lab/Test Results >> Apr 13, 2024  4:37 PM Roselie BROCKS wrote: Reason for CRM: Patient called for lab results , she would like to speak to someone about the results that was giving to her, Patient states to call after 10 am.

## 2024-04-18 LAB — SURGICAL PATHOLOGY

## 2024-04-18 NOTE — Telephone Encounter (Signed)
 pt informed

## 2024-04-19 ENCOUNTER — Other Ambulatory Visit: Payer: Self-pay

## 2024-04-19 ENCOUNTER — Encounter (HOSPITAL_COMMUNITY)
Admission: RE | Disposition: A | Discharge status: Home / Self Care | Payer: Self-pay | Source: Home / Self Care | Attending: Internal Medicine

## 2024-04-19 ENCOUNTER — Ambulatory Visit (HOSPITAL_COMMUNITY): Admitting: Anesthesiology

## 2024-04-19 ENCOUNTER — Ambulatory Visit (HOSPITAL_COMMUNITY)
Admission: RE | Admit: 2024-04-19 | Discharge: 2024-04-19 | Disposition: A | Discharge status: Home / Self Care | Attending: Internal Medicine | Admitting: Internal Medicine

## 2024-04-19 ENCOUNTER — Encounter (HOSPITAL_COMMUNITY): Payer: Self-pay | Admitting: Internal Medicine

## 2024-04-19 ENCOUNTER — Ambulatory Visit (HOSPITAL_BASED_OUTPATIENT_CLINIC_OR_DEPARTMENT_OTHER): Admitting: Anesthesiology

## 2024-04-19 DIAGNOSIS — K219 Gastro-esophageal reflux disease without esophagitis: Secondary | ICD-10-CM | POA: Insufficient documentation

## 2024-04-19 DIAGNOSIS — K5730 Diverticulosis of large intestine without perforation or abscess without bleeding: Secondary | ICD-10-CM | POA: Insufficient documentation

## 2024-04-19 DIAGNOSIS — Z111 Encounter for screening for respiratory tuberculosis: Secondary | ICD-10-CM | POA: Diagnosis present

## 2024-04-19 DIAGNOSIS — Z8249 Family history of ischemic heart disease and other diseases of the circulatory system: Secondary | ICD-10-CM | POA: Diagnosis not present

## 2024-04-19 DIAGNOSIS — D120 Benign neoplasm of cecum: Secondary | ICD-10-CM

## 2024-04-19 DIAGNOSIS — K635 Polyp of colon: Secondary | ICD-10-CM | POA: Diagnosis not present

## 2024-04-19 DIAGNOSIS — N289 Disorder of kidney and ureter, unspecified: Secondary | ICD-10-CM | POA: Insufficient documentation

## 2024-04-19 DIAGNOSIS — Z1211 Encounter for screening for malignant neoplasm of colon: Secondary | ICD-10-CM | POA: Diagnosis not present

## 2024-04-19 DIAGNOSIS — D122 Benign neoplasm of ascending colon: Secondary | ICD-10-CM | POA: Insufficient documentation

## 2024-04-19 DIAGNOSIS — I10 Essential (primary) hypertension: Secondary | ICD-10-CM | POA: Diagnosis not present

## 2024-04-19 DIAGNOSIS — Z139 Encounter for screening, unspecified: Secondary | ICD-10-CM | POA: Diagnosis not present

## 2024-04-19 HISTORY — PX: COLONOSCOPY: SHX5424

## 2024-04-19 SURGERY — COLONOSCOPY
Anesthesia: General

## 2024-04-19 MED ORDER — LACTATED RINGERS IV SOLN: INTRAVENOUS | Status: DC

## 2024-04-19 MED ORDER — LACTATED RINGERS IV SOLN: INTRAVENOUS | Status: DC | PRN

## 2024-04-19 MED ORDER — PROPOFOL 10 MG/ML IV BOLUS
INTRAVENOUS | Status: DC | PRN
  Administered 2024-04-19: 50 mg via INTRAVENOUS

## 2024-04-19 MED ORDER — PHENYLEPHRINE 80 MCG/ML (10ML) SYRINGE FOR IV PUSH (FOR BLOOD PRESSURE SUPPORT)
PREFILLED_SYRINGE | INTRAVENOUS | Status: DC | PRN
  Administered 2024-04-19 (×2): 80 ug via INTRAVENOUS

## 2024-04-19 MED ORDER — PROPOFOL 500 MG/50ML IV EMUL
INTRAVENOUS | Status: DC | PRN
  Administered 2024-04-19: 150 ug/kg/min via INTRAVENOUS

## 2024-04-19 MED ORDER — GLYCOPYRROLATE PF 0.2 MG/ML IJ SOSY
PREFILLED_SYRINGE | INTRAMUSCULAR | Status: DC | PRN
  Administered 2024-04-19: .2 mg via INTRAVENOUS

## 2024-04-19 NOTE — Transfer of Care (Signed)
 Immediate Anesthesia Transfer of Care Note  Patient: Desiree [name]  Procedure(s) Performed: COLONOSCOPY  Patient Location: Endoscopy Unit  Anesthesia Type:General  Level of Consciousness: awake, alert , oriented, and patient cooperative  Airway & Oxygen Therapy: Patient Spontanous Breathing  Post-op Assessment: Report given to RN, Post -op Vital signs reviewed and stable, and Patient moving all extremities X 4  Post vital signs: Reviewed and stable  Last Vitals:  Vitals Value Taken Time  BP 120/70 04/19/24 09:19  Temp 36.4 C 04/19/24 09:19  Pulse 68 04/19/24 09:19  Resp 19 04/19/24 09:19  SpO2 98 % 04/19/24 09:19    Last Pain:  Vitals:   04/19/24 0919  TempSrc: Oral  PainSc:       Patients Stated Pain Goal: 7 (04/19/24 0743)  Complications: No notable events documented.

## 2024-04-19 NOTE — Discharge Instructions (Addendum)
  Colonoscopy Discharge Instructions  Read the instructions outlined below and refer to this sheet in the next few weeks. These discharge instructions provide you with general information on caring for yourself after you leave the hospital. Your doctor may also give you specific instructions. While your treatment has been planned according to the most current medical practices available, unavoidable complications occasionally occur.   ACTIVITY You may resume your regular activity, but move at a slower pace for the next 24 hours.  Take frequent rest periods for the next 24 hours.  Walking will help get rid of the air and reduce the bloated feeling in your belly (abdomen).  No driving for 24 hours (because of the medicine (anesthesia) used during the test).   Do not sign any important legal documents or operate any machinery for 24 hours (because of the anesthesia used during the test).  NUTRITION Drink plenty of fluids.  You may resume your normal diet as instructed by your doctor.  Begin with a light meal and progress to your normal diet. Heavy or fried foods are harder to digest and may make you feel sick to your stomach (nauseated).  Avoid alcoholic beverages for 24 hours or as instructed.  MEDICATIONS You may resume your normal medications unless your doctor tells you otherwise.  WHAT YOU CAN EXPECT TODAY Some feelings of bloating in the abdomen.  Passage of more gas than usual.  Spotting of blood in your stool or on the toilet paper.  IF YOU HAD POLYPS REMOVED DURING THE COLONOSCOPY: No aspirin  products for 7 days or as instructed.  No alcohol  for 7 days or as instructed.  Eat a soft diet for the next 24 hours.  FINDING OUT THE RESULTS OF YOUR TEST Not all test results are available during your visit. If your test results are not back during the visit, make an appointment with your caregiver to find out the results. Do not assume everything is normal if you have not heard from your  caregiver or the medical facility. It is important for you to follow up on all of your test results.  SEEK IMMEDIATE MEDICAL ATTENTION IF: You have more than a spotting of blood in your stool.  Your belly is swollen (abdominal distention).  You are nauseated or vomiting.  You have a temperature over 101.  You have abdominal pain or discomfort that is severe or gets worse throughout the day.   Your colonoscopy revealed 3 polyp(s) which I removed successfully. Await pathology results, my office will contact you. Given your age, I do not think you need further colonoscopies for polyp surveillance.  You also have diverticulosis. I would recommend increasing fiber in your diet or adding OTC Benefiber/Metamucil. Be sure to drink at least 4 to 6 glasses of water  daily. Follow-up with GI as needed.   I hope you have a great rest of your week!  Carlin POUR. Cindie, D.O. Gastroenterology and Hepatology [facility]

## 2024-04-19 NOTE — Anesthesia Postprocedure Evaluation (Signed)
 Anesthesia Post Note  Patient: Desiree [name]  Procedure(s) Performed: COLONOSCOPY  Patient location during evaluation: Phase II Anesthesia Type: General Level of consciousness: awake Pain management: pain level controlled Vital Signs Assessment: post-procedure vital signs reviewed and stable Respiratory status: spontaneous breathing and respiratory function stable Cardiovascular status: blood pressure returned to baseline and stable Postop Assessment: no headache and no apparent nausea or vomiting Anesthetic complications: no Comments: Late entry   No notable events documented.   Last Vitals:  Vitals:   04/19/24 0743 04/19/24 0919  BP: (!) 151/72 120/70  Pulse: (!) 59 68  Resp: 14 19  Temp: 36.4 C 36.4 C  SpO2: 99% 98%    Last Pain:  Vitals:   04/19/24 0919  TempSrc: Oral  PainSc: 0-No pain                 Yvonna JINNY Bosworth

## 2024-04-19 NOTE — H&P (Signed)
 Primary Care Physician:  [name], MD Primary Gastroenterologist:  Dr. Cindie  Pre-Procedure History & Physical: HPI:  Desiree [name] is a 78 y.o. female is here for a colonoscopy for colon cancer screening purposes.   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--  S/P  RIGHT MASTECTMOY AND RADIATION--  NO RECURRANCE   History of CVA (cerebrovascular accident) PER SCAN IN 2011   Hyperlipidemia    Hypertension    Impaired fasting glucose PER PCP  DR ANTONETTA   WATCH DIET   Mixed urge and stress incontinence    Nocturia    Sinus drainage    Thoracic ascending aortic aneurysm (HCC) LAST CHEST CT 09-02-2010--  FOLLOWED BY  CARDIOLOGIST--  DR RMNPUNML   Vaginal wall prolapse     Past Surgical History:  Procedure Laterality Date   ABDOMINAL HYSTERECTOMY     BILATERAL CARPAL TUNNEL RELEASE  1980'S   BREAST BIOPSY Right 04/14/2024   US  RT BREAST BX W LOC DEV 1ST LESION IMG BX SPEC US  GUIDE 04/14/2024 [facility]-ULTRASOUND   CARDIAC CATHETERIZATION  02-01-2009  DR EICHHORN   NORMAL CORONARY ARTERIES/ NORMAL LV SIZE AND FUNCTION/ AORTIC ROOT SIZE AT THE UPPER LIMIT OF NORMAL    CARPAL TUNNEL RELEASE     CERVICAL DISC SURGERY  12-12-2005  DR UNICE   LEFT  C6 - C7 HERINATED / DDD/ SPONDYLOSIS   COLONOSCOPY N/A 02/10/2014   Surgeon: Margo LITTIE Haddock, MD; Normal TI, mild diverticulosis in sigmoid and transverse colon, small external hemorrhoids, no obvious source for anemia.  Repeat colonoscopy in 10-15 years if benefits outweigh risk.   CYSTOCELE REPAIR  08/02/2012   Procedure: ANTERIOR REPAIR (CYSTOCELE);  Surgeon: Arlena LILLETTE Gal, MD;  Location: [facility];  Service: Urology;  Laterality: N/A;  [employer] Uphold Anterior Pelvic Floor Sacrospinus Repair. Anterior wall of the vagina.   ESOPHAGOGASTRODUODENOSCOPY N/A 02/10/2014   Surgeon: Margo LITTIE Haddock, MD;   Mild gastritis/duodenitis s/p biopsied, no obvious source for anemia.  Pathology with mild chronic gastritis with intestinal metaplasia, negative for H. pylori, duodenal biopsy benign.   GIVENS CAPSULE STUDY N/A 03/13/2014   Surgeon: Margo LITTIE Haddock, MD;  Normal exam.  Suspected anemia most likely due to chronic disease.   JOINT REPLACEMENT Right    Phreesia 06/16/2020- knee   NECK SURGERY  2007   RIGHT PARTIAL MASTECTOMY  11-11-2004  DR PETER YOUNG   DUCTAL CARCINOMA IN SITU RIGHT BREAST   TOTAL ABDOMINAL HYSTERECTOMY W/ BILATERAL SALPINGOOPHORECTOMY  1990   TOTAL KNEE ARTHROPLASTY Right 12/21/2019   Procedure: RIGHT  TOTAL KNEE ARTHROPLASTY;  Surgeon: Barbarann Oneil BROCKS, MD;  Location: [facility] OR;  Service: Orthopedics;  Laterality: Right;   TRANSTHORACIC ECHOCARDIOGRAM  03-30-2008  DR MARGARET SIMPSON   LV SIZE AND FUNCTION NORMAL/ MODERATE AORTIC ARCH DILATATION/ MILD MR    Prior to Admission medications   Medication Sig Start Date End Date Taking? Authorizing Provider  acetaminophen  (TYLENOL ) 500 MG tablet Take one to  two tablets by mouth at night for pain 02/20/20  Yes [name],  MD  carvedilol  (COREG ) 3.125 MG tablet TAKE 1 TABLET(3.125 MG) BY MOUTH TWICE DAILY WITH A MEAL 04/07/24  Yes Croitoru, Mihai, MD  loratadine  (CLARITIN ) 10 MG tablet Take one tablet by mouth  one to two times daily as needed for uncontrolled allergies 07/16/23  Yes [name], MD  losartan  (COZAAR ) 50 MG tablet Take 1 tablet (50 mg total) by mouth daily. 09/16/23  Yes [name], MD  lovastatin  (MEVACOR ) 40 MG tablet Take 1 tablet (40 mg total) by mouth daily. 07/16/23  Yes [name], MD  montelukast  (SINGULAIR ) 10 MG tablet  Take 10 mg by mouth at bedtime.   Yes [provider]  Multiple Vitamin (MULTIVITAMIN) tablet Take 1 tablet by mouth daily. One a day woman's   Yes [provider]  aspirin  EC 325 MG tablet Take 1 tablet (325 mg total) by mouth daily. 02/21/13   [name], MD  chlorthalidone  (HYGROTON ) 50 MG tablet Take 1 tablet (50 mg total) by mouth daily. 01/13/24   [name], MD  Cholecalciferol (VITAMIN D ) 50 MCG (2000 UT) tablet Take 2,000 Units by mouth daily.     [provider]  Ferrous Sulfate  (SLOW FE PO) Take 1 capsule by mouth every other day. Patient not taking: No sig reported    [provider]  terbinafine  (LAMISIL ) 1 % cream Apply twice daily to rash on scalp for 1 week, then , as needed 06/13/22   [name], MD  tiZANidine  (ZANAFLEX ) 2 MG tablet Take 1 tablet (2 mg total) by mouth every 8 (eight) hours as needed for muscle spasms. 01/13/24   [name], MD  zinc  gluconate 50 MG tablet Take 50 mg by mouth daily.    [provider]    Allergies as of 03/21/2024 - Review Complete 03/18/2024  Allergen Reaction Noted   Codeine Other (See Comments) 09/20/2020   Quinolones Other (See Comments) 08/19/2018    Family History  Problem Relation Age of Onset   Kidney failure Mother    Hypertension Mother    Heart attack Mother 27   Throat cancer Father    Prostate cancer Father    Lung cancer Father 41   Breast cancer Sister    Hypertension Brother        2 stents   Colon cancer Neg Hx    Colon polyps Neg Hx     Social History   Socioeconomic History   Marital status: Divorced    Spouse name: Not on file   Number of children: 4   Years of education: 12+   Highest education level: Some college, no degree  Occupational History   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  Tobacco Use   Smoking status: Never   Smokeless tobacco: Never  Vaping Use   Vaping status:  Never Used  Substance and Sexual Activity   Alcohol  use: No   Drug use: No   Sexual activity: Never  Other Topics Concern   Not on file  Social History Narrative   RETIRED IN June 2020.    Social Drivers of Corporate investment banker Strain: Low Risk  (07/05/2021)   Overall Financial Resource Strain (CARDIA)    Difficulty of Paying Living Expenses: Not hard at all  Food Insecurity: No Food Insecurity (07/05/2021)   Hunger Vital Sign    Worried About Running Out of Food in the Last Year: Never true  Ran Out of Food in the Last Year: Never true  Transportation Needs: No Transportation Needs (07/05/2021)   PRAPARE - Administrator, Civil Service (Medical): No    Lack of Transportation (Non-Medical): No  Physical Activity: Insufficiently Active (07/05/2021)   Exercise Vital Sign    Days of Exercise per Week: 3 days    Minutes of Exercise per Session: 30 min  Stress: No Stress Concern Present (06/19/2020)   [employer] of Occupational Health - Occupational Stress Questionnaire    Feeling of Stress : Not at all  Social Connections: Moderately Integrated (07/05/2021)   Social Connection and Isolation Panel    Frequency of Communication with Friends and Family: More than three times a week    Frequency of Social Gatherings with Friends and Family: More than three times a week    Attends Religious Services: More than 4 times per year    Active Member of Clubs or Organizations: Yes    Attends Banker Meetings: More than 4 times per year    Marital Status: Divorced  Intimate Partner Violence: Not At Risk (07/05/2021)   Humiliation, Afraid, Rape, and Kick questionnaire    Fear of Current or Ex-Partner: No    Emotionally Abused: No    Physically Abused: No    Sexually Abused: No    Review of Systems: See HPI, otherwise negative ROS  Physical Exam: Vital signs in last 24 hours: Temp:  [97.6 F (36.4 C)] 97.6 F (36.4 C) (08/26 0743) Pulse Rate:   [59] 59 (08/26 0743) Resp:  [14] 14 (08/26 0743) BP: (151)/(72) 151/72 (08/26 0743) SpO2:  [99 %] 99 % (08/26 0743) Weight:  [86.2 kg] 86.2 kg (08/26 0743)   General:   Alert,  Well-developed, well-nourished, pleasant and cooperative in NAD Head:  Normocephalic and atraumatic. Eyes:  Sclera clear, no icterus.   Conjunctiva pink. Ears:  Normal auditory acuity. Nose:  No deformity, discharge,  or lesions. Msk:  Symmetrical without gross deformities. Normal posture. Extremities:  Without clubbing or edema. Neurologic:  Alert and  oriented x4;  grossly normal neurologically. Skin:  Intact without significant lesions or rashes. Psych:  Alert and cooperative. Normal mood and affect.  Impression/Plan: Desiree [name] is here for a colonoscopy to be performed for colon cancer screening purposes.  The risks of the procedure including infection, bleed, or perforation as well as benefits, limitations, alternatives and imponderables have been reviewed with the patient. Questions have been answered. All parties agreeable.

## 2024-04-19 NOTE — Anesthesia Preprocedure Evaluation (Signed)
 Anesthesia Evaluation  Patient identified by MRN, date of birth, ID band Patient awake    Reviewed: Allergy & Precautions, H&P , NPO status , Patient's Chart, lab work & pertinent test results, reviewed documented beta blocker date and time   Airway Mallampati: II  TM Distance: >3 FB Neck ROM: full    Dental no notable dental hx.    Pulmonary neg pulmonary ROS   Pulmonary exam normal breath sounds clear to auscultation       Cardiovascular Exercise Tolerance: Good hypertension, + Valvular Problems/Murmurs  Rhythm:regular Rate:Normal     Neuro/Psych negative neurological ROS  negative psych ROS   GI/Hepatic Neg liver ROS,GERD  ,,  Endo/Other  negative endocrine ROS    Renal/GU Renal disease  negative genitourinary   Musculoskeletal   Abdominal   Peds  Hematology  (+) Blood dyscrasia, anemia   Anesthesia Other Findings   Reproductive/Obstetrics negative OB ROS                              Anesthesia Physical Anesthesia Plan  ASA: 2  Anesthesia Plan: General   Post-op Pain Management:    Induction:   PONV Risk Score and Plan: Propofol  infusion  Airway Management Planned:   Additional Equipment:   Intra-op Plan:   Post-operative Plan:   Informed Consent: I have reviewed the patients History and Physical, chart, labs and discussed the procedure including the risks, benefits and alternatives for the proposed anesthesia with the patient or authorized representative who has indicated his/her understanding and acceptance.     Dental Advisory Given  Plan Discussed with: CRNA  Anesthesia Plan Comments:         Anesthesia Quick Evaluation

## 2024-04-19 NOTE — Op Note (Signed)
 [facility] Patient Name: Desiree [name] Procedure Date: 04/19/2024 8:08 AM MRN: [id number] Date of Birth: 1946-05-24 Attending MD: Carlin POUR. Cindie , [place], [id number] CSN: [id number] Age: 78 Admit Type: Outpatient Procedure:                Colonoscopy Indications:              Screening for colorectal malignant neoplasm Providers:                Carlin POUR. Cindie, DO, Tammy Vaught, RN, Dorcas Lenis, Technician Referring MD:              Medicines:                See the Anesthesia note for documentation of the                            administered medications Complications:            No immediate complications. Estimated Blood Loss:     Estimated blood loss was minimal. Procedure:                Pre-Anesthesia Assessment:                           - The anesthesia plan was to use monitored                            anesthesia care (MAC).                           After obtaining informed consent, the colonoscope                            was passed under direct vision. Throughout the                            procedure, the patient's blood pressure, pulse, and                            oxygen saturations were monitored continuously. The                            PCF-HQ190L ([id number]) Peds Colon was introduced                            through the anus and advanced to the the cecum,                            identified by appendiceal orifice and ileocecal                            valve. The colonoscopy was performed without                            difficulty. The patient tolerated the procedure  well. The quality of the bowel preparation was                            evaluated using the BBPS [facility] Bowel Preparation                            Scale) with scores of: Right Colon = 3, Transverse                            Colon = 3 and Left Colon = 3 (entire mucosa seen                            well with no  residual staining, small fragments of                            stool or opaque liquid). The total BBPS score                            equals 9. Scope In: 8:52:09 AM Scope Out: 9:15:43 AM Scope Withdrawal Time: 0 hours 9 minutes 46 seconds  Total Procedure Duration: 0 hours 23 minutes 34 seconds  Findings:      Multiple large-mouthed and small-mouthed diverticula were found in the       sigmoid colon, descending colon and transverse colon.      Three sessile polyps were found in the ascending colon and cecum. The       polyps were 3 to 5 mm in size. These polyps were removed with a cold       snare. Resection and retrieval were complete.      The exam was otherwise without abnormality. Impression:               - Diverticulosis in the sigmoid colon, in the                            descending colon and in the transverse colon.                           - Three 3 to 5 mm polyps in the ascending colon and                            in the cecum, removed with a cold snare. Resected                            and retrieved.                           - The examination was otherwise normal. Moderate Sedation:      Per Anesthesia Care Recommendation:           - Patient has a contact number available for                            emergencies. The signs and symptoms of potential  delayed complications were discussed with the                            patient. Return to normal activities tomorrow.                            Written discharge instructions were provided to the                            patient.                           - Resume previous diet.                           - Continue present medications.                           - Await pathology results.                           - No repeat colonoscopy due to age.                           - Return to GI clinic PRN. Procedure Code(s):        --- Professional ---                           [phone],  Colonoscopy, flexible; with removal of                            tumor(s), polyp(s), or other lesion(s) by snare                            technique Diagnosis Code(s):        --- Professional ---                           Z12.11, Encounter for screening for malignant                            neoplasm of colon                           D12.2, Benign neoplasm of ascending colon                           D12.0, Benign neoplasm of cecum                           K57.30, Diverticulosis of large intestine without                            perforation or abscess without bleeding CPT copyright 2022 American Medical Association. All rights reserved. The codes documented in this report are preliminary and upon coder review may  be revised to meet current compliance requirements. Carlin POUR. Cindie, DO Carlin POUR.  Cindie, DO 04/19/2024 9:25:32 AM This report has been signed electronically. Number of Addenda: 0

## 2024-04-20 LAB — SURGICAL PATHOLOGY

## 2024-04-21 ENCOUNTER — Encounter (HOSPITAL_COMMUNITY): Payer: Self-pay | Admitting: Internal Medicine

## 2024-04-25 DIAGNOSIS — M25472 Effusion, left ankle: Secondary | ICD-10-CM | POA: Insufficient documentation

## 2024-04-25 DIAGNOSIS — Z09 Encounter for follow-up examination after completed treatment for conditions other than malignant neoplasm: Secondary | ICD-10-CM | POA: Insufficient documentation

## 2024-04-25 NOTE — Assessment & Plan Note (Signed)
 Update levels to determine deficiency

## 2024-04-25 NOTE — Assessment & Plan Note (Signed)
 Check uric acid level, advised ice and elevation and sodiium  restriction should it recur

## 2024-04-25 NOTE — Assessment & Plan Note (Signed)
Patient in for follow up of recent ED visit. Discharge summary, and laboratory and radiology data are reviewed, and any questions or concerns  are discussed. Specific issues requiring follow up are specifically addressed.  

## 2024-04-25 NOTE — Assessment & Plan Note (Signed)
 Controlled, no change in medication

## 2024-04-26 ENCOUNTER — Ambulatory Visit: Payer: Self-pay | Admitting: Internal Medicine

## 2024-04-26 ENCOUNTER — Ambulatory Visit: Payer: Self-pay | Admitting: Family Medicine

## 2024-04-26 ENCOUNTER — Telehealth: Payer: Self-pay

## 2024-04-26 ENCOUNTER — Ambulatory Visit: Payer: Self-pay | Admitting: General Surgery

## 2024-04-26 DIAGNOSIS — C50311 Malignant neoplasm of lower-inner quadrant of right female breast: Secondary | ICD-10-CM | POA: Diagnosis not present

## 2024-04-26 DIAGNOSIS — Z170 Estrogen receptor positive status [ER+]: Secondary | ICD-10-CM

## 2024-04-26 NOTE — Telephone Encounter (Signed)
   Pre-operative Risk Assessment    Patient Name: Desiree [name]  DOB: [birth year] MRN: [id number]   Date of last office visit: 07/29/23 Date of next office visit: n/a   Request for Surgical Clearance    Procedure:  right breast cancer lumpectomy   Date of Surgery:  Clearance TBD                                 Surgeon:  [name], MD  Surgeon's Group or Practice Name:  [facility]  Phone number:  [phone] Fax number:  [phone]   Type of Clearance Requested:   - Medical  - Pharmacy:  Hold Aspirin  not indicated    Type of Anesthesia:  General    Additional requests/questions:    SignedRebeca Blight   04/26/2024, 2:07 PM

## 2024-04-26 NOTE — Telephone Encounter (Signed)
 Tried contacting patient to schedule TELEVISIT no answer unable to leave vm will try again at a later time

## 2024-04-26 NOTE — Progress Notes (Signed)
 REFERRING PHYSICIAN:  [name], MD PROVIDER:  DEWARD GARNETTE NULL, MD MRN: [id number] DOB: 03/20/1946 DATE OF ENCOUNTER: 04/26/2024 Subjective    Chief Complaint: New Consultation (New Breast Cancer)   History of Present Illness: Desiree [name] is a 78 y.o. female who is seen today as an office consultation for evaluation of New Consultation (New Breast Cancer)   We are asked to see the patient in consultation by Dr. Rollene Antonetta to evaluate her for a new right breast cancer.  The patient is a 78 year old black female who recently went for a routine screening mammogram.  At that time she was found to have a 7 mm mass in the lower inner quadrant of the right breast.  The lymph nodes looked normal.  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  She states she was treated with radiation but no further therapy.  She does have a stable aortic aneurysm that is followed by cardiology.  She does not smoke.    Review of Systems: A complete review of systems was obtained from the patient.  I have reviewed this information and discussed as appropriate with the patient.  See HPI as well for other ROS.  ROS   Medical History: Past Medical History:  Diagnosis Date  . Aneurysm ()   . Arthritis   . DDD (degenerative disc disease), cervical   . History of cancer   . History of stroke   . Hypertension     There is no problem list on file for this patient.   Past Surgical History:  Procedure Laterality Date  . ESOPHAGOGASTRODUODENOSCOPY, FLEXIBLE, TRANSORAL FOR BARIATRIC BALLOON ADJUSTMENT  02/10/2014  . ARTHROPLASTY TOTAL KNEE Right 12/21/2019  . Breast bioopsy Right 04/14/2024  . COLONOSCOPY  04/19/2024  . Cardiac catheterization    . ENDOSCOPIC CARPAL TUNNEL RELEASE    . HYSTERECTOMY        Allergies  Allergen Reactions  . Codeine Other (See Comments)    CONFUSION/ DIZZY  CONFUSION/ DIZZY  Other reaction(s): Other (see comments)  CONFUSION/ DIZZY  . Quinolones Other (See Comments)    Patient was warned about not using Cipro  and similar antibiotics.  Recent studies have raised concern that fluoroquinolone antibiotics could be associated with an increased risk of aortic aneurysm  Fluoroquinolones have non-antimicrobial properties that might jeopardise the integrity of the extracellular matrix of the vascular wall  I.  Patient was warned about not using Cipro  and similar antibiotics.  Recent studies have raised concern that fluoroquinolone antibiotics could be associated with an increased risk of aortic aneurysm  Fluoroquinolones have non-antimicrobial properties that might jeopardise the integrity of the extracellular matrix of the vascular wall  I.  Other reaction(s): Other (see comments)  Patient was warned about not using Cipro  and similar antibiotics.  Recent studies have raised concern that fluoroquinolone antibiotics could be associated with an increased risk of aortic aneurysm  Fluoroquinolones have non-antimicrobial properties that might jeopardise the integrity of the extracellular matrix of the vascular wall  I.    Current Outpatient Medications on File Prior to Visit  Medication Sig Dispense Refill  . carvediloL  (COREG ) 3.125 MG tablet Take 3.125 mg by mouth 2 (two) times daily with meals    . chlorthalidone  50 MG tablet Take  50 mg by mouth once daily    . cholecalciferol (VITAMIN D3) 2,000 unit tablet Take 2,000 Units by mouth once daily    . loratadine  (CLARITIN ) 10 mg tablet Take one tablet by mouth  one to two times daily as needed for uncontrolled allergies    . losartan  (COZAAR ) 50 MG tablet Take 50 mg by mouth once daily    . lovastatin  (MEVACOR ) 40 MG tablet Take 40 mg by mouth once daily    . montelukast  (SINGULAIR ) 10 mg tablet Take 10 mg by mouth  at bedtime    . multivitamin tablet Take 1 tablet by mouth once daily    . terBINafine  HCL (LAMISIL  AT) 1 % cream Apply twice daily to rash on scalp for 1 week, then , as needed (Patient not taking: Reported on 04/26/2024)    . tiZANidine  (ZANAFLEX ) 2 MG tablet Take 2 mg by mouth every 8 (eight) hours as needed (Patient not taking: Reported on 04/26/2024)    . zinc  gluconate 50 mg tablet Take 50 mg by mouth once daily (Patient not taking: Reported on 04/26/2024)     No current facility-administered medications on file prior to visit.    Family History  Problem Relation Age of Onset  . High blood pressure (Hypertension) Mother   . Coronary Artery Disease (Blocked arteries around heart) Mother   . Kidney failure Mother   . Myocardial Infarction (Heart attack) Mother   . Throat cancer Father   . Lung cancer Father   . Prostate cancer Father   . Breast cancer Sister   . High blood pressure (Hypertension) Brother   . Colon polyps Neg Hx   . Colon cancer Neg Hx      Social History   Tobacco Use  Smoking Status Never  Smokeless Tobacco Never     Social History   Socioeconomic History  . Marital status: Divorced  Tobacco Use  . Smoking status: Never  . Smokeless tobacco: Never  Vaping Use  . Vaping status: Never Used  Substance and Sexual Activity  . Alcohol  use: Never  . Drug use: Never   Social Drivers of Corporate investment banker Strain: Low Risk  (07/05/2021)   Received from [facility]   Overall Financial Resource Strain (CARDIA)   . Difficulty of Paying Living Expenses: Not hard at all  Food Insecurity: No Food Insecurity (07/05/2021)   Received from [facility]   Hunger Vital Sign   . Within the past 12 months, you worried that your food would run out before you got the money to buy more.: Never true   . Within the past 12 months, the food you bought just didn't last and you didn't have money to get more.: Never true  Transportation Needs: No Transportation Needs  (07/05/2021)   Received from [facility] - Transportation   . Lack of Transportation (Medical): No   . Lack of Transportation (Non-Medical): No  Physical Activity: Insufficiently Active (07/05/2021)   Received from [facility]   Exercise Vital Sign   . On average, how many days per week do you engage in moderate to strenuous exercise (like a brisk walk)?: 3 days   . On average, how many minutes do you engage in exercise at this level?: 30 min  Stress: No Stress Concern Present (06/19/2020)   Received from [facility] - Occupational Stress Questionnaire   . Feeling of Stress : Not at all  Social Connections: Moderately Integrated (07/05/2021)   Received from [facility]   Social Connection and Isolation Panel   . In a typical week, how many times do you talk on the phone with family, friends, or neighbors?: More than three times a week   . How often do you get together with friends or relatives?: More than three times a week   . How often do you attend church or religious services?: More than 4 times per year   . Do you belong to any clubs or organizations such as church groups, unions, fraternal or athletic groups, or school groups?: Yes   . How often do you attend meetings of the clubs or organizations you belong to?: More than 4 times per year   . Are you married, widowed, divorced, separated, never married, or living with a partner?: Divorced    Objective:   Vitals:   04/26/24 0824 04/26/24 0829  BP: (!) 150/80   Temp: 36.6 C (97.8 F)   Weight: 87.5 kg (192 lb 12.8 oz)   Height: 156.2 cm (5' 1.5)   PainSc:  0-No pain    Body mass index is 35.84 kg/m.  Physical Exam Vitals reviewed.  Constitutional:      General: She is not in acute distress.    Appearance: Normal appearance.  HENT:     Head: Normocephalic and atraumatic.     Right Ear: External ear normal.     Left Ear: External ear normal.     Nose: Nose  normal.     Mouth/Throat:     Mouth: Mucous membranes are moist.     Pharynx: Oropharynx is clear.  Eyes:     General: No scleral icterus.    Extraocular Movements: Extraocular movements intact.     Conjunctiva/sclera: Conjunctivae normal.     Pupils: Pupils are equal, round, and reactive to light.  Cardiovascular:     Rate and Rhythm: Normal rate and regular rhythm.     Pulses: Normal pulses.     Heart sounds: Murmur heard.  Pulmonary:     Effort: Pulmonary effort is normal. No respiratory distress.     Breath sounds: Normal breath sounds.  Abdominal:     General: Bowel sounds are normal.     Palpations: Abdomen is soft.     Tenderness: There is no abdominal tenderness.  Musculoskeletal:        General: No swelling, tenderness or deformity. Normal range of motion.     Cervical back: Normal range of motion and neck supple.  Skin:    General: Skin is warm and dry.     Coloration: Skin is not jaundiced.  Neurological:     General: No focal deficit present.     Mental Status: She is alert and oriented to person, place, and time.  Psychiatric:        Mood and Affect: Mood normal.        Behavior: Behavior normal.      Breast: There is a well-healed scar in the upper portion of the right breast.  There is no palpable mass in either breast.  There is no palpable axillary, supraclavicular, or cervical lymphadenopathy.  Labs, Imaging and Diagnostic Testing:   Assessment and Plan:     Diagnoses and all orders for this visit:  Malignant neoplasm of lower-inner quadrant of right breast of female, estrogen receptor positive (CMS/HHS-HCC) -     CCS Case Posting Request; Future    The patient appears to have a  small early-stage 7 mm cancer in the lower inner quadrant of the right breast with clinically negative nodes and all favorable markers.  I have discussed with her in detail the different options for treatment and at this point she favors breast conservation which I feel is  very reasonable even with her history of previous radiation.  She would likely do well with a lumpectomy and antiestrogens.  I will refer her to the medical and radiation oncologist to discuss adjuvant therapy.  We will plan for a right breast radioactive seed localized lumpectomy.  I have discussed with her in detail the risks and benefits of the operation as well as some of the technical aspects including the use of a radioactive seed for localization and she understands and wishes to proceed.  We will get cardiac clearance and then move forward with surgical scheduling    DEWARD GARNETTE NULL, MD    I spent a total of 60 minutes in both face-to-face and non-face-to-face activities, excluding procedures performed, for this visit on the date of this encounter.

## 2024-04-26 NOTE — Telephone Encounter (Signed)
   Name: Desiree [name]  DOB: 06-Nov-1945  MRN: [id number]  Primary Cardiologist: Jerel Balding, MD   Preoperative team, please contact this patient and set up a phone call appointment for further preoperative risk assessment. Please obtain consent and complete medication review. Thank you for your help.  I confirm that guidance regarding antiplatelet and oral anticoagulation therapy has been completed and, if necessary, noted below.  Patient's aspirin  is not prescribed by cardiology.  Recommendations for holding aspirin  will need to come from prescribing provider.  I also confirmed the patient resides in the state of [place] . As per [facility] Medical Board telemedicine laws, the patient must reside in the state in which the provider is licensed.   [name], NP 04/26/2024, 3:37 PM [facility]

## 2024-04-27 ENCOUNTER — Telehealth: Payer: Self-pay

## 2024-04-27 NOTE — Telephone Encounter (Signed)
 Preop tele appt now scheduled, med rec and consent done.

## 2024-04-27 NOTE — Telephone Encounter (Signed)
  Patient Consent for Virtual Visit        Desiree [name] has provided verbal consent on 04/27/2024 for a virtual visit (video or telephone).   CONSENT FOR VIRTUAL VISIT FOR:  Desiree [name]  By participating in this virtual visit I agree to the following:  I hereby voluntarily request, consent and authorize [facility] and its employed or contracted physicians, physician assistants, nurse practitioners or other licensed health care professionals (the Practitioner), to provide me with telemedicine health care services (the "Services) as deemed necessary by the treating Practitioner. I acknowledge and consent to receive the Services by the Practitioner via telemedicine. I understand that the telemedicine visit will involve communicating with the Practitioner through live audiovisual communication technology and the disclosure of certain medical information by electronic transmission. I acknowledge that I have been given the opportunity to request an in-person assessment or other available alternative prior to the telemedicine visit and am voluntarily participating in the telemedicine visit.  I understand that I have the right to withhold or withdraw my consent to the use of telemedicine in the course of my care at any time, without affecting my right to future care or treatment, and that the Practitioner or I may terminate the telemedicine visit at any time. I understand that I have the right to inspect all information obtained and/or recorded in the course of the telemedicine visit and may receive copies of available information for a reasonable fee.  I understand that some of the potential risks of receiving the Services via telemedicine include:  Delay or interruption in medical evaluation due to technological equipment failure or disruption; Information transmitted may not be sufficient (e.g. poor resolution of images) to allow for appropriate medical decision making by the  Practitioner; and/or  In rare instances, security protocols could fail, causing a breach of personal health information.  Furthermore, I acknowledge that it is my responsibility to provide information about my medical history, conditions and care that is complete and accurate to the best of my ability. I acknowledge that Practitioner's advice, recommendations, and/or decision may be based on factors not within their control, such as incomplete or inaccurate data provided by me or distortions of diagnostic images or specimens that may result from electronic transmissions. I understand that the practice of medicine is not an exact science and that Practitioner makes no warranties or guarantees regarding treatment outcomes. I acknowledge that a copy of this consent can be made available to me via my patient portal [facility] MyChart), or I can request a printed copy by calling the office of [facility].    I understand that my insurance will be billed for this visit.   I have read or had this consent read to me. I understand the contents of this consent, which adequately explains the benefits and risks of the Services being provided via telemedicine.  I have been provided ample opportunity to ask questions regarding this consent and the Services and have had my questions answered to my satisfaction. I give my informed consent for the services to be provided through the use of telemedicine in my medical care

## 2024-04-29 ENCOUNTER — Telehealth: Payer: Self-pay | Admitting: Radiation Oncology

## 2024-04-29 ENCOUNTER — Other Ambulatory Visit: Payer: Self-pay | Admitting: *Deleted

## 2024-04-29 DIAGNOSIS — Z170 Estrogen receptor positive status [ER+]: Secondary | ICD-10-CM | POA: Insufficient documentation

## 2024-04-29 NOTE — Telephone Encounter (Signed)
 Spoke to pt regarding consult with Dr. Shannon to discuss radiation as option for treatment. Pt agreeable to consult but requested I contact her daughter to schedule since she relies on her for transportation. I LVM for pt's daughter to c/b to schedule.

## 2024-04-29 NOTE — Progress Notes (Signed)
 Location of Breast Cancer:Right breast   Histology per Pathology Report:    Receptor Status: ER(70), PR (90), Her2-neu (), Ki-67(5)  Did patient present with symptoms (if so, please note symptoms) or was this found on screening mammography?: screening   Past/Anticipated interventions by surgeon, if jwb:mphyu  Patient to have lumpectomy   Past/Anticipated interventions by medical oncology, if any:     Lymphedema issues, if any:  no    Pain issues, if any:  no    SAFETY ISSUES: Prior radiation? Yes Right breast 12/17/04-01/30/05 Pacemaker/ICD? no Possible current pregnancy?no Is the patient on methotrexate? no  Current Complaints / other details:    BP (!) 159/83 (BP Location: Right Arm, Patient Position: Sitting, Cuff Size: Large)   Pulse (!) 55   Temp 97.6 F (36.4 C)   Resp 20   Ht 5' 1.5 (1.562 m)   Wt 193 lb (87.5 kg)   SpO2 100%   BMI 35.88 kg/m

## 2024-05-01 NOTE — Progress Notes (Signed)
 Radiation Oncology         [phone] ________________________________  Initial Outpatient Consultation  Name: Desiree [name] MRN: [id number]  Date: 05/02/2024  DOB: February 04, 1946  RR:[id number], Rollene BRAVO, MD  [name], MD   REFERRING PHYSICIAN: Curvin Deward III, MD  DIAGNOSIS: There were no encounter diagnoses.  Stage *** (***) Right Breast LIQ, Invasive Ductal Carcinoma, ER+ / PR+ / Her2-, Grade 2  History of right breast DCIS diagnosed in 2006, s/p radiation therapy   HISTORY OF PRESENT ILLNESS::Desiree [name] is a 78 y.o. female who is accompanied by ***. she is seen as a courtesy of Dr. Curvin for an opinion concerning radiation therapy as part of management for her recently diagnosed right breast cancer.   She presented for a bilateral diagnostic screening mammogram on 03/23/24 which demonstrated a possible abnormality in the right breast. A right breast diagnostic mammogram and right breast ultrasound was accordingly performed on 04/07/24 which further revealed: a suspicious 0.7 cm mass in the 5 o'clock right breast located 5 cmfn, correlating with the screening abnormality. No evidence of right axillary lymphadenopathy or other abnormalities in the right breast demonstrated otherwise.   Biopsy of the 5 o'clock right breast mass on 04/14/24 showed grade 2 invasive ductal carcinoma measuring 6 mm in the greatest linear extent of the sample with calcifications present. Prognostic indicators significant for: estrogen receptor 70% positive with moderate staining intensity; progesterone receptor 90% positive with strong staining intensity; Proliferation marker Ki67 at 5%; Her2 status negative; Grade 2.   She was referred to general surgery and seen in consultation by Dr. Curvin on 04/26/24. She in interested in pursuing breast conservation and antiestrogen therapy based on her prior history of radiation therapy to the right breast and per her discussion with Dr. Curvin. She will also need to  obtain cardiac clearance prior to surgery given her extensive cardiac history (history includes AAA, stroke, and hypertension.    PREVIOUS RADIATION THERAPY: Yes, in 2006 for right breast DCIS: s/p right breast lumpectomy in March 2006 followed by radiation therapy   PAST MEDICAL HISTORY:  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--  S/P  RIGHT MASTECTMOY AND RADIATION--  NO RECURRANCE   History of CVA (cerebrovascular accident) PER SCAN IN 2011   Hyperlipidemia    Hypertension    Impaired fasting glucose PER PCP  DR ANTONETTA   WATCH DIET   Mixed urge and stress incontinence    Nocturia    Sinus drainage    Thoracic ascending aortic aneurysm (HCC) LAST CHEST CT 09-02-2010--  FOLLOWED BY  CARDIOLOGIST--  DR [id number]   Vaginal wall prolapse     PAST SURGICAL HISTORY: Past Surgical History:  Procedure Laterality Date   ABDOMINAL HYSTERECTOMY     BILATERAL CARPAL TUNNEL RELEASE  1980'S   BREAST BIOPSY Right 04/14/2024   US  RT BREAST BX W LOC DEV 1ST LESION IMG BX SPEC US  GUIDE 04/14/2024 [facility]-ULTRASOUND   CARDIAC CATHETERIZATION  02-01-2009  DR EICHHORN   NORMAL CORONARY ARTERIES/ NORMAL LV SIZE AND FUNCTION/ AORTIC ROOT SIZE AT THE  UPPER LIMIT OF NORMAL    CARPAL TUNNEL RELEASE     CERVICAL DISC SURGERY  12-12-2005  DR UNICE   LEFT  C6 - C7 HERINATED / DDD/ SPONDYLOSIS   COLONOSCOPY N/A 02/10/2014   Surgeon: Margo LITTIE Haddock, MD; Normal TI, mild diverticulosis in sigmoid and transverse colon, small external hemorrhoids, no obvious source for anemia.  Repeat colonoscopy in 10-15 years if benefits outweigh risk.   COLONOSCOPY  N/A 04/19/2024   Procedure: COLONOSCOPY;  Surgeon: [name], DO;  Location: [facility];  Service: Endoscopy;  Laterality: N/A;  9:00 AM,  OK RM 1-2   CYSTOCELE REPAIR  08/02/2012   Procedure: ANTERIOR REPAIR (CYSTOCELE);  Surgeon: Arlena LILLETTE Gal, MD;  Location: [facility];  Service: Urology;  Laterality: N/A;  [employer] Uphold Anterior Pelvic Floor Sacrospinus Repair. Anterior wall of the vagina.   ESOPHAGOGASTRODUODENOSCOPY N/A 02/10/2014   Surgeon: Margo LITTIE Haddock, MD;   Mild gastritis/duodenitis s/p biopsied, no obvious source for anemia.  Pathology with mild chronic gastritis with intestinal metaplasia, negative for H. pylori, duodenal biopsy benign.   GIVENS CAPSULE STUDY N/A 03/13/2014   Surgeon: Margo LITTIE Haddock, MD;  Normal exam.  Suspected anemia most likely due to chronic disease.   JOINT REPLACEMENT Right    [employer] 06/16/2020- knee   NECK SURGERY  2007   RIGHT PARTIAL MASTECTOMY  11-11-2004  DR PETER YOUNG   DUCTAL CARCINOMA IN SITU RIGHT BREAST   TOTAL ABDOMINAL HYSTERECTOMY W/ BILATERAL SALPINGOOPHORECTOMY  1990   TOTAL KNEE ARTHROPLASTY Right 12/21/2019   Procedure: RIGHT  TOTAL KNEE ARTHROPLASTY;  Surgeon: Barbarann Oneil BROCKS, MD;  Location: [facility];  Service: Orthopedics;  Laterality: Right;   TRANSTHORACIC ECHOCARDIOGRAM  03-30-2008  DR MARGARET SIMPSON   LV SIZE AND FUNCTION NORMAL/ MODERATE AORTIC ARCH DILATATION/ MILD MR    FAMILY HISTORY:  Family History  Problem Relation Age of Onset   Kidney failure Mother    Hypertension Mother    Heart attack Mother 4   Throat cancer Father    Prostate cancer Father    Lung cancer Father 59   Breast cancer Sister    Hypertension Brother        2 stents   Colon cancer Neg Hx    Colon polyps Neg Hx     SOCIAL HISTORY:  Social History   Tobacco Use   Smoking status: Never   Smokeless tobacco: Never  Vaping Use   Vaping status: Never Used  Substance Use Topics   Alcohol  use: No   Drug  use: No    ALLERGIES:  Allergies  Allergen Reactions   Codeine Other (See Comments)    CONFUSION/ DIZZY Other reaction(s): Other (see comments) CONFUSION/ DIZZY   Quinolones Other (See Comments)    Patient was warned about not using Cipro  and similar antibiotics. Recent studies have raised concern that fluoroquinolone antibiotics could be associated with an increased risk of aortic aneurysm Fluoroquinolones have non-antimicrobial properties that might jeopardise the integrity of the extracellular matrix of the vascular wall I. Other reaction(s): Other (see comments) Patient was warned about not using Cipro  and similar antibiotics. Recent studies have raised concern that fluoroquinolone antibiotics could be associated with an increased risk of aortic aneurysm Fluoroquinolones have non-antimicrobial properties that might jeopardise the integrity of the extracellular matrix of the vascular wall I.    MEDICATIONS:  Current Outpatient Medications  Medication Sig Dispense Refill   acetaminophen  (TYLENOL ) 500 MG tablet Take one to  two tablets by mouth at night for pain 50 tablet 0   aspirin  EC 325 MG tablet Take 1 tablet (325 mg total) by mouth daily. 100 tablet 3   carvedilol  (COREG ) 3.125 MG tablet TAKE 1 TABLET(3.125 MG) BY MOUTH TWICE DAILY WITH A MEAL 180 tablet 1   chlorthalidone  (HYGROTON ) 50 MG tablet Take 1 tablet (50 mg total) by mouth daily. 90 tablet 3   Cholecalciferol (VITAMIN D ) 50 MCG (2000 UT) tablet Take 2,000 Units by mouth daily.      Ferrous Sulfate  (SLOW FE PO) Take 1 capsule by mouth every other day. (Patient not taking: Reported on 04/27/2024)     loratadine  (CLARITIN ) 10 MG tablet Take one tablet by mouth  one to two times daily as needed for uncontrolled allergies 90 tablet 2   losartan  (COZAAR ) 50 MG tablet Take 1 tablet (50 mg total) by mouth daily. 90 tablet 3   lovastatin  (MEVACOR ) 40 MG tablet Take 1 tablet (40 mg total) by mouth daily. 90 tablet 2    montelukast  (SINGULAIR ) 10 MG tablet Take 10 mg by mouth at bedtime.     Multiple Vitamin (MULTIVITAMIN) tablet Take 1 tablet by mouth daily. One a day woman's     terbinafine  (LAMISIL ) 1 % cream Apply twice daily to rash on scalp for 1 week, then , as needed 42 g 0   tiZANidine  (ZANAFLEX ) 2 MG tablet Take 1 tablet (2 mg total) by mouth every 8 (eight) hours as needed for muscle spasms. 30 tablet 0   zinc  gluconate 50 MG tablet Take 50 mg by mouth daily.     No current facility-administered medications for this encounter.    REVIEW OF SYSTEMS:  A 10+ POINT REVIEW OF SYSTEMS WAS OBTAINED including neurology, dermatology, psychiatry, cardiac, respiratory, lymph, extremities, GI, GU, musculoskeletal, constitutional, reproductive, HEENT. ***   PHYSICAL EXAM:  vitals were not taken for this visit.   General: Alert and oriented, in no acute distress HEENT: Head is normocephalic. Extraocular movements are intact. Oropharynx is clear. Neck: Neck is supple, no palpable cervical or supraclavicular lymphadenopathy. Heart: Regular in rate and rhythm with no murmurs, rubs, or gallops. Chest: Clear to auscultation bilaterally, with no rhonchi, wheezes, or rales. Abdomen: Soft, nontender, nondistended, with no rigidity or guarding. Extremities: No cyanosis or edema. Lymphatics: see Neck Exam Skin: No concerning lesions. Musculoskeletal: symmetric strength and muscle tone throughout. Neurologic: Cranial nerves II through XII are grossly intact. No obvious focalities. Speech is fluent. Coordination is intact. Psychiatric: Judgment and insight are intact. Affect is appropriate. ***  ECOG = ***  0 - Asymptomatic (Fully active, able to carry on all predisease activities without restriction)  1 - Symptomatic but completely ambulatory (Restricted in physically strenuous activity but ambulatory and able to carry out work of a light or sedentary nature. For example, light housework, office work)  2 -  Symptomatic, <50% in bed during the day (Ambulatory and capable of all self care but unable to carry out any work activities. Up and about more than 50% of waking hours)  3 - Symptomatic, >50% in bed, but not bedbound (Capable of only limited self-care, confined to bed or chair 50% or more of waking hours)  4 - Bedbound (Completely disabled. Cannot carry on any self-care. Totally confined to bed or chair)  5 - Death   Raylene MM, Creech RH, Tormey DC, et al. [phone]). Toxicity and response criteria of the [facility]. Am. DOROTHA Bridges. Oncol. 5 (6): [phone]  LABORATORY DATA:  Lab Results  Component Value Date   WBC 4.9 03/29/2024   HGB 10.4 (L) 03/29/2024   HCT 32.9 (L) 03/29/2024   MCV 90 03/29/2024   PLT 310 03/29/2024   NEUTROABS 3.2 03/29/2024   Lab Results  Component Value Date   NA 143 06/11/2023   K 3.9 06/11/2023   CL 103 06/11/2023   CO2 24 06/11/2023   GLUCOSE 92 06/11/2023   BUN 30 (H) 06/11/2023   CREATININE 1.53 (H) 06/11/2023   CALCIUM  10.0 06/11/2023      RADIOGRAPHY: US  RT BREAST BX W LOC DEV 1ST LESION IMG BX SPEC US  GUIDE Addendum Date: 04/15/2024 ADDENDUM REPORT: 04/15/2024 14:13 ADDENDUM: PATHOLOGY revealed: A. BREAST MASS, RIGHT 5:00; 5 CMFN, NEEDLE CORE BIOPSY: - Invasive ductal carcinoma, see comment - Overall Grade: 2 - Lymphovascular invasion: Not identified - Cancer Length: 0.6 cm - Calcifications: Present. Pathology results are CONCORDANT with imaging findings, per Aliene Lloyd M.D. Pathology results and recommendations were discussed with patient via telephone on 04/15/2024 by Rock Hover RN. Patient reported biopsy site doing well with no adverse symptoms, and slight tenderness at the site. Post biopsy care instructions were reviewed, questions were answered and my direct phone number was provided. Patient was instructed to call [facility] Mammography Department for any additional questions or concerns related to biopsy  site. RECOMMENDATIONS: 1. Surgical and oncological consultation. Per patient request, surgical consultation was sent to Erminio Console at [facility] on 04/15/2025 by Rock Hover RN. Pathology results reported by Rock Hover RN on 04/15/2024. Electronically Signed   By: Aliene Lloyd M.D.   On: 04/15/2024 14:13   Result Date: 04/15/2024 CLINICAL DATA:  78 year old woman with suspicious 0.7 cm RIGHT breast mass (5 o'clock 5 CMFN) presents for ultrasound-guided core needle biopsy. EXAM: ULTRASOUND GUIDED RIGHT BREAST CORE NEEDLE BIOPSY COMPARISON:  Previous exam(s). PROCEDURE: I met with the patient and we discussed the procedure of ultrasound-guided biopsy, including benefits and alternatives. We discussed the high likelihood of a successful procedure. We discussed the risks of the procedure, including infection, bleeding, tissue injury, clip migration, and inadequate sampling. Informed written consent was given. The usual time-out protocol was performed immediately prior to the procedure. Lesion quadrant: Lower inner quadrant Using sterile technique and 1% Lidocaine  as local anesthetic, under direct ultrasound visualization, a 14 gauge spring-loaded device was used to perform biopsy of 0.7 cm RIGHT breast mass using an inferior approach. At the conclusion of the procedure ribbon shaped tissue marker clip was deployed into the biopsy cavity. Follow up 2 view mammogram was performed and dictated separately. IMPRESSION: Ultrasound guided biopsy of 0.7 cm RIGHT breast mass (5 o'clock 5 CMFN). No apparent complications. Electronically Signed: By: Aliene Lloyd M.D. On: 04/14/2024 09:51   MM CLIP PLACEMENT RIGHT Result Date: 04/14/2024 CLINICAL DATA:  Status post ultrasound-guided core needle biopsy of RIGHT breast mass. EXAM: 3D DIAGNOSTIC RIGHT MAMMOGRAM POST ULTRASOUND BIOPSY COMPARISON:  Previous exam(s). ACR Breast Density Category b: There are scattered areas of fibroglandular density. FINDINGS: 3D  Mammographic images were obtained following ultrasound guided biopsy of RIGHT breast mass. The biopsy marking clip is in expected position at the site of biopsy. IMPRESSION: Appropriate positioning of the ribbon shaped biopsy marking clip at the site of biopsy in the lower-inner RIGHT breast. Final Assessment: Post Procedure Mammograms for Marker Placement Electronically Signed   By: Aliene Lloyd M.D.   On: 04/14/2024 09:50   MM 3D DIAGNOSTIC MAMMOGRAM UNILATERAL RIGHT  BREAST Result Date: 04/07/2024 CLINICAL DATA:  78 year old woman with history of malignant RIGHT lumpectomy in 2006 for DCIS recalled for possible RIGHT breast asymmetry. EXAM: DIGITAL DIAGNOSTIC UNILATERAL RIGHT MAMMOGRAM WITH TOMOSYNTHESIS AND CAD; ULTRASOUND RIGHT BREAST LIMITED TECHNIQUE: Right digital diagnostic mammography and breast tomosynthesis was performed. The images were evaluated with computer-aided detection. ; Targeted ultrasound examination of the right breast was performed COMPARISON:  Previous exam(s). ACR Breast Density Category b: There are scattered areas of fibroglandular density. FINDINGS: RIGHT: Mammogram: Spot compression CC and MLO views of the RIGHT breast were obtained. Oval obscured mass persists in the lower inner RIGHT breast at posterior depth on additional images. Ultrasound: Targeted sonographic evaluation of the RIGHT breast demonstrates an oval mass with indistinct and microlobulated margins with posterior acoustic shadowing, measuring 0.7 x 0.5 x 0.5 cm at 5 o'clock 5 CMFN. This corresponds to the abnormality seen on mammogram. No enlarged RIGHT axillary lymph nodes were identified. IMPRESSION: Suspicious 0.7 cm RIGHT breast mass (5 o'clock 5 CMFN) corresponding to the abnormality seen on mammogram. RECOMMENDATION: Ultrasound-guided core needle biopsy of 0.7 cm RIGHT breast mass (5 o'clock 5 CMFN). I have discussed the findings and recommendations with the patient. The biopsy procedure was explained to the  patient and questions were answered. Patient expressed their understanding of the biopsy recommendation. Patient will be scheduled for biopsy at her earliest convenience by the schedulers. Ordering provider will be notified. If applicable, a reminder letter will be sent to the patient regarding the next appointment. BI-RADS CATEGORY  4: Suspicious. Electronically Signed   By: Aliene Lloyd M.D.   On: 04/07/2024 12:37   US  LIMITED ULTRASOUND INCLUDING AXILLA RIGHT BREAST Result Date: 04/07/2024 CLINICAL DATA:  78 year old woman with history of malignant RIGHT lumpectomy in 2006 for DCIS recalled for possible RIGHT breast asymmetry. EXAM: DIGITAL DIAGNOSTIC UNILATERAL RIGHT MAMMOGRAM WITH TOMOSYNTHESIS AND CAD; ULTRASOUND RIGHT BREAST LIMITED TECHNIQUE: Right digital diagnostic mammography and breast tomosynthesis was performed. The images were evaluated with computer-aided detection. ; Targeted ultrasound examination of the right breast was performed COMPARISON:  Previous exam(s). ACR Breast Density Category b: There are scattered areas of fibroglandular density. FINDINGS: RIGHT: Mammogram: Spot compression CC and MLO views of the RIGHT breast were obtained. Oval obscured mass persists in the lower inner RIGHT breast at posterior depth on additional images. Ultrasound: Targeted sonographic evaluation of the RIGHT breast demonstrates an oval mass with indistinct and microlobulated margins with posterior acoustic shadowing, measuring 0.7 x 0.5 x 0.5 cm at 5 o'clock 5 CMFN. This corresponds to the abnormality seen on mammogram. No enlarged RIGHT axillary lymph nodes were identified. IMPRESSION: Suspicious 0.7 cm RIGHT breast mass (5 o'clock 5 CMFN) corresponding to the abnormality seen on mammogram. RECOMMENDATION: Ultrasound-guided core needle biopsy of 0.7 cm RIGHT breast mass (5 o'clock 5 CMFN). I have discussed the findings and recommendations with the patient. The biopsy procedure was explained to the patient and  questions were answered. Patient expressed their understanding of the biopsy recommendation. Patient will be scheduled for biopsy at her earliest convenience by the schedulers. Ordering provider will be notified. If applicable, a reminder letter will be sent to the patient regarding the next appointment. BI-RADS CATEGORY  4: Suspicious. Electronically Signed   By: Aliene Lloyd M.D.   On: 04/07/2024 12:37      IMPRESSION: Stage *** (***) Right Breast LIQ, Invasive Ductal Carcinoma, ER+ / PR+ / Her2-, Grade 2  History of right breast DCIS diagnosed in 2006, s/p radiation therapy   ***  Today, I talked to the patient and family about the findings and work-up thus far.  We discussed the natural history of *** and general treatment, highlighting the role of radiotherapy in the management.  We discussed the available radiation techniques, and focused on the details of logistics and delivery.  We reviewed the anticipated acute and late sequelae associated with radiation in this setting.  The patient was encouraged to ask questions that I answered to the best of my ability. *** A patient consent form was discussed and signed.  We retained a copy for our records.  The patient would like to proceed with radiation and will be scheduled for CT simulation.  PLAN: ***    *** minutes of total time was spent for this patient encounter, including preparation, face-to-face counseling with the patient and coordination of care, physical exam, and documentation of the encounter.   ------------------------------------------------  Lynwood CHARM Nasuti, PhD, MD  This document serves as a record of services personally performed by Lynwood Nasuti, MD. It was created on his behalf by Dorthy Fuse, a trained medical scribe. The creation of this record is based on the scribe's personal observations and the provider's statements to them. This document has been checked and approved by the attending provider.

## 2024-05-02 ENCOUNTER — Ambulatory Visit
Admission: RE | Admit: 2024-05-02 | Discharge: 2024-05-02 | Disposition: A | Discharge status: Home / Self Care | Source: Ambulatory Visit | Attending: Radiation Oncology | Admitting: Radiation Oncology

## 2024-05-02 ENCOUNTER — Encounter: Payer: Self-pay | Admitting: Radiation Oncology

## 2024-05-02 VITALS — BP 159/83 | HR 55 | Temp 97.6°F | Resp 20 | Ht 61.5 in | Wt 193.0 lb

## 2024-05-02 DIAGNOSIS — K2950 Unspecified chronic gastritis without bleeding: Secondary | ICD-10-CM | POA: Diagnosis not present

## 2024-05-02 DIAGNOSIS — I10 Essential (primary) hypertension: Secondary | ICD-10-CM | POA: Insufficient documentation

## 2024-05-02 DIAGNOSIS — Z8673 Personal history of transient ischemic attack (TIA), and cerebral infarction without residual deficits: Secondary | ICD-10-CM | POA: Insufficient documentation

## 2024-05-02 DIAGNOSIS — Z1721 Progesterone receptor positive status: Secondary | ICD-10-CM | POA: Diagnosis not present

## 2024-05-02 DIAGNOSIS — M129 Arthropathy, unspecified: Secondary | ICD-10-CM | POA: Diagnosis not present

## 2024-05-02 DIAGNOSIS — Z7982 Long term (current) use of aspirin: Secondary | ICD-10-CM | POA: Insufficient documentation

## 2024-05-02 DIAGNOSIS — R011 Cardiac murmur, unspecified: Secondary | ICD-10-CM | POA: Diagnosis not present

## 2024-05-02 DIAGNOSIS — Z801 Family history of malignant neoplasm of trachea, bronchus and lung: Secondary | ICD-10-CM | POA: Diagnosis not present

## 2024-05-02 DIAGNOSIS — Z923 Personal history of irradiation: Secondary | ICD-10-CM | POA: Insufficient documentation

## 2024-05-02 DIAGNOSIS — C50311 Malignant neoplasm of lower-inner quadrant of right female breast: Secondary | ICD-10-CM | POA: Diagnosis not present

## 2024-05-02 DIAGNOSIS — Z170 Estrogen receptor positive status [ER+]: Secondary | ICD-10-CM | POA: Diagnosis not present

## 2024-05-02 DIAGNOSIS — Z8042 Family history of malignant neoplasm of prostate: Secondary | ICD-10-CM | POA: Insufficient documentation

## 2024-05-02 DIAGNOSIS — Z79899 Other long term (current) drug therapy: Secondary | ICD-10-CM | POA: Insufficient documentation

## 2024-05-02 DIAGNOSIS — M5030 Other cervical disc degeneration, unspecified cervical region: Secondary | ICD-10-CM | POA: Insufficient documentation

## 2024-05-02 DIAGNOSIS — Z803 Family history of malignant neoplasm of breast: Secondary | ICD-10-CM | POA: Diagnosis not present

## 2024-05-02 DIAGNOSIS — E785 Hyperlipidemia, unspecified: Secondary | ICD-10-CM | POA: Insufficient documentation

## 2024-05-02 DIAGNOSIS — I7121 Aneurysm of the ascending aorta, without rupture: Secondary | ICD-10-CM | POA: Insufficient documentation

## 2024-05-02 DIAGNOSIS — Z1732 Human epidermal growth factor receptor 2 negative status: Secondary | ICD-10-CM | POA: Diagnosis not present

## 2024-05-06 ENCOUNTER — Inpatient Hospital Stay: Attending: Hematology and Oncology | Admitting: Hematology and Oncology

## 2024-05-06 ENCOUNTER — Encounter: Payer: Self-pay | Admitting: Hematology and Oncology

## 2024-05-06 ENCOUNTER — Other Ambulatory Visit

## 2024-05-06 VITALS — BP 140/65 | HR 54 | Temp 98.1°F | Resp 14 | Wt 190.8 lb

## 2024-05-06 DIAGNOSIS — Z1721 Progesterone receptor positive status: Secondary | ICD-10-CM | POA: Diagnosis not present

## 2024-05-06 DIAGNOSIS — Z79899 Other long term (current) drug therapy: Secondary | ICD-10-CM | POA: Diagnosis not present

## 2024-05-06 DIAGNOSIS — I719 Aortic aneurysm of unspecified site, without rupture: Secondary | ICD-10-CM | POA: Insufficient documentation

## 2024-05-06 DIAGNOSIS — Z8719 Personal history of other diseases of the digestive system: Secondary | ICD-10-CM | POA: Insufficient documentation

## 2024-05-06 DIAGNOSIS — Z8673 Personal history of transient ischemic attack (TIA), and cerebral infarction without residual deficits: Secondary | ICD-10-CM | POA: Diagnosis not present

## 2024-05-06 DIAGNOSIS — Z8249 Family history of ischemic heart disease and other diseases of the circulatory system: Secondary | ICD-10-CM | POA: Diagnosis not present

## 2024-05-06 DIAGNOSIS — Z808 Family history of malignant neoplasm of other organs or systems: Secondary | ICD-10-CM | POA: Diagnosis not present

## 2024-05-06 DIAGNOSIS — Z7982 Long term (current) use of aspirin: Secondary | ICD-10-CM | POA: Diagnosis not present

## 2024-05-06 DIAGNOSIS — N644 Mastodynia: Secondary | ICD-10-CM | POA: Diagnosis not present

## 2024-05-06 DIAGNOSIS — Z170 Estrogen receptor positive status [ER+]: Secondary | ICD-10-CM | POA: Insufficient documentation

## 2024-05-06 DIAGNOSIS — E785 Hyperlipidemia, unspecified: Secondary | ICD-10-CM | POA: Diagnosis not present

## 2024-05-06 DIAGNOSIS — Z803 Family history of malignant neoplasm of breast: Secondary | ICD-10-CM | POA: Insufficient documentation

## 2024-05-06 DIAGNOSIS — Z8679 Personal history of other diseases of the circulatory system: Secondary | ICD-10-CM | POA: Insufficient documentation

## 2024-05-06 DIAGNOSIS — Z90722 Acquired absence of ovaries, bilateral: Secondary | ICD-10-CM | POA: Insufficient documentation

## 2024-05-06 DIAGNOSIS — Z90710 Acquired absence of both cervix and uterus: Secondary | ICD-10-CM | POA: Insufficient documentation

## 2024-05-06 DIAGNOSIS — K5730 Diverticulosis of large intestine without perforation or abscess without bleeding: Secondary | ICD-10-CM | POA: Insufficient documentation

## 2024-05-06 DIAGNOSIS — N6489 Other specified disorders of breast: Secondary | ICD-10-CM | POA: Insufficient documentation

## 2024-05-06 DIAGNOSIS — Z8042 Family history of malignant neoplasm of prostate: Secondary | ICD-10-CM | POA: Insufficient documentation

## 2024-05-06 DIAGNOSIS — K2950 Unspecified chronic gastritis without bleeding: Secondary | ICD-10-CM | POA: Diagnosis not present

## 2024-05-06 DIAGNOSIS — C50311 Malignant neoplasm of lower-inner quadrant of right female breast: Secondary | ICD-10-CM | POA: Diagnosis not present

## 2024-05-06 DIAGNOSIS — Z801 Family history of malignant neoplasm of trachea, bronchus and lung: Secondary | ICD-10-CM | POA: Diagnosis not present

## 2024-05-06 DIAGNOSIS — Z841 Family history of disorders of kidney and ureter: Secondary | ICD-10-CM | POA: Insufficient documentation

## 2024-05-06 DIAGNOSIS — I10 Essential (primary) hypertension: Secondary | ICD-10-CM | POA: Insufficient documentation

## 2024-05-06 DIAGNOSIS — Z885 Allergy status to narcotic agent status: Secondary | ICD-10-CM | POA: Diagnosis not present

## 2024-05-06 DIAGNOSIS — Z1732 Human epidermal growth factor receptor 2 negative status: Secondary | ICD-10-CM | POA: Insufficient documentation

## 2024-05-06 NOTE — Progress Notes (Signed)
 [facility] CONSULT NOTE  Patient Care Team: [name], MD as PCP - General (Family Medicine) Croitoru, Jerel, MD as PCP - Cardiology (Cardiology) [name], MD (Inactive) as Consulting Physician (Cardiothoracic Surgery) Francyne Jerel, MD as Consulting Physician (Cardiology) [name], MD as Consulting Physician (Ophthalmology) [name], MD as Consulting Physician (Urology) [name], DO as Consulting Physician (Gastroenterology)  CHIEF COMPLAINTS/PURPOSE OF CONSULTATION:  New diagnosis of breast cancer.  ASSESSMENT & PLAN:  No problem-specific Assessment & Plan notes found for this encounter.  No orders of the defined types were placed in this encounter.  Assessment and Plan Assessment & Plan Invasive ductal carcinoma of right breast, stage 1, ER/PR positive, HER2 negative Stage 1, 7 mm, grade 2 tumor, ER/PR positive, HER2 negative. History of breast cancer in 2006, treated with lumpectomy and radiation.  - Schedule lumpectomy post-cardiac clearance. - Discuss radiation therapy with radiation oncologist. - Prescribe anti-estrogen pill for 5 years post-surgery. - Monitor bone density during hormonal therapy. - we discussed about oncotype DX, pt is currently not interested in it, hence we will proceed with surgery followed by adj radiation and anti estrogen therapy.  Aortic aneurysm, under surveillance Aortic aneurysm under surveillance since 2009.  - Continue regular surveillance of aortic aneurysm. - Stop aspirin  5-7 days before surgery to prevent bleeding.   HISTORY OF PRESENTING ILLNESS:  Desiree [name] 78 y.o. female is here because of right sided breast cancer  Oncology History  Malignant neoplasm of female breast (HCC)  02/04/2008 Initial Diagnosis   Malignant neoplasm of female breast (HCC)    Mammogram   Further evaluation is suggested for possible asymmetry in the right breast. Targeted sonographic  evaluation of the RIGHT breast demonstrates an oval mass with indistinct and microlobulated margins with posterior acoustic shadowing, measuring 0.7 x 0.5 x 0.5 cm at 5 o'clock 5 CMFN. This corresponds to the abnormality seen on mammogram.    Breast US    Suspicious 0.7 cm RIGHT breast mass (5 o'clock 5 CMFN) corresponding to the abnormality seen on mammogram.    Pathology Results   BREAST MASS, RIGHT 5:00 5 CMFN, NEEDLE CORE BIOPSY:  - Invasive ductal carcinoma, see comment  - Tubule formation: Score 3  - Nuclear pleomorphism: Score 3  - Mitotic count: Score 1  - Total score: 7  - Overall Grade: 2  - Lymphovascular invasion: Not identified  - Cancer Length: 0.6 cm  - Calcifications: Present  - Other findings: None   The tumor cells are NEGATIVE for Her2 (O).   Estrogen Receptor:        70%, POSITIVE, MODERATE STAINING INTENSITY  Progesterone Receptor:    90%, POSITIVE, STRONG STAINING INTENSITY  Proliferation Marker Ki-67:      5%      Malignant neoplasm of lower-inner quadrant of right breast of female, estrogen receptor positive (HCC)  04/29/2024 Initial Diagnosis   Malignant neoplasm of lower-inner quadrant of right breast of female, estrogen receptor positive (HCC)   05/02/2024 Cancer Staging   Staging form: Breast, AJCC 8th Edition - Clinical stage from 05/02/2024: Stage IA (cT1b, cN0, cM0, G2, ER+, PR+, HER2-) - Signed by Wyatt Leeroy HERO, PA-C on 05/02/2024 Stage prefix: Initial diagnosis Histologic grading system: 3 grade system    She is here with her daughter. 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 She didn't take anti estrogen therapy back then.  Discussed the use of AI scribe software for clinical  note transcription with the patient, who gave verbal consent to proceed.  History of Present Illness Desiree [name] is a 78 year old female with a recent diagnosis of breast cancer who presents for follow-up.  She has a history of breast  cancer diagnosed in 2006 in the right breast, for which she underwent a lumpectomy and radiation therapy. Recently, a screening mammogram revealed an asymmetry in the right breast, leading to a diagnostic mammogram that identified a 7 mm area of concern at the five o'clock position, approximately 5 cm from the nipple. A biopsy confirmed ductal carcinoma that is estrogen and progesterone receptor positive and HER2 negative.  She experiences discomfort in the right breast, described as 'pins or sticking or throbbing,' since the recent biopsy. She frequently feels cold and dresses warmly as a result.  She has a history of aortic aneurysm discovered in 2009, which has been monitored annually. No recent cardiac events or history of blood clots are reported. She has a history of stroke and a heart murmur. She underwent a total hysterectomy due to fibroid tumors.  She is currently taking 325 mg of aspirin  daily. She has not been on any hormone therapy since her previous breast cancer treatment.  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    REVIEW OF SYSTEMS:   Constitutional: Denies fevers, chills or abnormal night sweats Eyes: Denies blurriness of vision, double vision or watery eyes Ears, nose, mouth, throat, and face: Denies mucositis or sore throat Respiratory: Denies cough, dyspnea or wheezes Cardiovascular: Denies palpitation, chest discomfort or lower extremity swelling Gastrointestinal:  Denies nausea, heartburn or change in bowel habits Skin: Denies abnormal skin rashes Lymphatics: Denies new lymphadenopathy or easy bruising Neurological:Denies numbness, tingling or new weaknesses Behavioral/Psych: Mood is stable, no new changes  All other systems were reviewed with the patient and are negative.  MEDICAL HISTORY:  Past Medical History:  Diagnosis Date   Allergic rhinitis    Arthritis HIPS/ WRISTS   Breast cancer (HCC) 2006   right breasr   DDD (degenerative disc  disease), cervical    GERD (gastroesophageal reflux disease) OCCASIONAL   Heart murmur    turbulent flow through LVOT, no obstruction 08/26/18 echo   History of Bell's palsy 2011  RIGHT SIDE -- RESOLVED   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--  S/P  RIGHT MASTECTMOY AND RADIATION--  NO RECURRANCE   History of CVA (cerebrovascular accident) PER SCAN IN 2011   Hyperlipidemia    Hypertension    Impaired fasting glucose PER PCP  DR ANTONETTA   WATCH DIET   Mixed urge and stress incontinence    Nocturia    Sinus drainage    Thoracic ascending aortic aneurysm (HCC) LAST CHEST CT 09-02-2010--  FOLLOWED BY  CARDIOLOGIST--  DR [id number]   Vaginal wall prolapse     SURGICAL HISTORY: Past Surgical History:  Procedure Laterality Date   ABDOMINAL HYSTERECTOMY     BILATERAL CARPAL TUNNEL RELEASE  1980'S   BREAST BIOPSY Right 04/14/2024   US  RT BREAST BX W LOC DEV 1ST LESION IMG BX SPEC US  GUIDE 04/14/2024 [facility]-ULTRASOUND   CARDIAC CATHETERIZATION  02-01-2009  DR EICHHORN   NORMAL CORONARY ARTERIES/ NORMAL LV SIZE AND FUNCTION/ AORTIC ROOT SIZE AT THE UPPER LIMIT OF NORMAL    CARPAL TUNNEL RELEASE     CERVICAL DISC SURGERY  12-12-2005  DR STERN   LEFT  C6 - C7 HERINATED / DDD/ SPONDYLOSIS   COLONOSCOPY  N/A 02/10/2014   Surgeon: Margo LITTIE Haddock, MD; Normal TI, mild diverticulosis in sigmoid and transverse colon, small external hemorrhoids, no obvious source for anemia.  Repeat colonoscopy in 10-15 years if benefits outweigh risk.   COLONOSCOPY N/A 04/19/2024   Procedure: COLONOSCOPY;  Surgeon: [name], DO;  Location: [facility];  Service: Endoscopy;  Laterality: N/A;  9:00 AM,  OK RM 1-2   CYSTOCELE REPAIR  08/02/2012   Procedure: ANTERIOR REPAIR (CYSTOCELE);  Surgeon: Arlena LILLETTE Gal, MD;  Location[facility];  Service: Urology;  Laterality: N/A;  [employer] Uphold Anterior Pelvic Floor Sacrospinus Repair. Anterior wall of the vagina.    ESOPHAGOGASTRODUODENOSCOPY N/A 02/10/2014   Surgeon: Margo LITTIE Haddock, MD;   Mild gastritis/duodenitis s/p biopsied, no obvious source for anemia.  Pathology with mild chronic gastritis with intestinal metaplasia, negative for H. pylori, duodenal biopsy benign.   GIVENS CAPSULE STUDY N/A 03/13/2014   Surgeon: Margo LITTIE Haddock, MD;  Normal exam.  Suspected anemia most likely due to chronic disease.   JOINT REPLACEMENT Right    [employer] 06/16/2020- knee   NECK SURGERY  2007   RIGHT PARTIAL MASTECTOMY  11-11-2004  DR PETER YOUNG   DUCTAL CARCINOMA IN SITU RIGHT BREAST   TOTAL ABDOMINAL HYSTERECTOMY W/ BILATERAL SALPINGOOPHORECTOMY  1990   TOTAL KNEE ARTHROPLASTY Right 12/21/2019   Procedure: RIGHT  TOTAL KNEE ARTHROPLASTY;  Surgeon: Barbarann Oneil BROCKS, MD;  Location: [facility] OR;  Service: Orthopedics;  Laterality: Right;   TRANSTHORACIC ECHOCARDIOGRAM  03-30-2008  DR MARGARET SIMPSON   LV SIZE AND FUNCTION NORMAL/ MODERATE AORTIC ARCH DILATATION/ MILD MR    SOCIAL HISTORY: Social History   Socioeconomic History   Marital status: Divorced    Spouse name: Not on file   Number of children: 4   Years of education: 12+   Highest education level: Some college, no degree  Occupational History   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  Tobacco Use   Smoking status: Never   Smokeless tobacco: Never  Vaping Use   Vaping status: Never Used  Substance and Sexual Activity   Alcohol  use: No   Drug use: No   Sexual activity: Never  Other Topics Concern   Not on file  Social History Narrative   RETIRED IN June 2020.    Social Drivers of Corporate investment banker Strain: Low Risk  (05/06/2024)   Overall Financial Resource Strain (CARDIA)    Difficulty of Paying Living Expenses: Not hard at all  Food Insecurity: No Food Insecurity (05/06/2024)   Hunger Vital Sign    Worried About Running Out of Food in the Last Year: Never true    Ran Out of Food in the Last  Year: Never true  Transportation Needs: No Transportation Needs (05/02/2024)   PRAPARE - Administrator, Civil Service (Medical): No    Lack of Transportation (Non-Medical): No  Physical Activity: Insufficiently Active (07/05/2021)   Exercise Vital Sign    Days of Exercise per Week: 3 days    Minutes of Exercise per Session: 30 min  Stress: No Stress Concern Present (06/19/2020)   [employer] of Occupational Health - Occupational Stress Questionnaire    Feeling of Stress : Not at all  Social Connections: Moderately Integrated (05/06/2024)   Social Connection and Isolation Panel    Frequency of Communication with Friends and Family: More than three times a week  Frequency of Social Gatherings with Friends and Family: More than three times a week    Attends Religious Services: More than 4 times per year    Active Member of [employer] or Organizations: Yes    Attends Engineer, structural: More than 4 times per year    Marital Status: Divorced  Intimate Partner Violence: Not At Risk (05/06/2024)   Humiliation, Afraid, Rape, and Kick questionnaire    Fear of Current or Ex-Partner: No    Emotionally Abused: No    Physically Abused: No    Sexually Abused: No    FAMILY HISTORY: Family History  Problem Relation Age of Onset   Kidney failure Mother    Hypertension Mother    Heart attack Mother 58   Throat cancer Father    Prostate cancer Father    Lung cancer Father 96   Cancer Sister        breast   Breast cancer Sister    Hypertension Brother        2 stents   Colon cancer Neg Hx    Colon polyps Neg Hx     ALLERGIES:  is allergic to codeine and quinolones.  MEDICATIONS:  Current Outpatient Medications  Medication Sig Dispense Refill   acetaminophen  (TYLENOL ) 500 MG tablet Take one to  two tablets by mouth at night for pain 50 tablet 0   aspirin  EC 325 MG tablet Take 1 tablet (325 mg total) by mouth daily. 100 tablet 3   carvedilol  (COREG ) 3.125 MG  tablet TAKE 1 TABLET(3.125 MG) BY MOUTH TWICE DAILY WITH A MEAL 180 tablet 1   chlorthalidone  (HYGROTON ) 50 MG tablet Take 1 tablet (50 mg total) by mouth daily. 90 tablet 3   Cholecalciferol (VITAMIN D ) 50 MCG (2000 UT) tablet Take 2,000 Units by mouth daily.      loratadine  (CLARITIN ) 10 MG tablet Take one tablet by mouth  one to two times daily as needed for uncontrolled allergies 90 tablet 2   losartan  (COZAAR ) 50 MG tablet Take 1 tablet (50 mg total) by mouth daily. 90 tablet 3   lovastatin  (MEVACOR ) 40 MG tablet Take 1 tablet (40 mg total) by mouth daily. 90 tablet 2   montelukast  (SINGULAIR ) 10 MG tablet Take 10 mg by mouth at bedtime.     terbinafine  (LAMISIL ) 1 % cream Apply twice daily to rash on scalp for 1 week, then , as needed 42 g 0   tiZANidine  (ZANAFLEX ) 2 MG tablet Take 1 tablet (2 mg total) by mouth every 8 (eight) hours as needed for muscle spasms. 30 tablet 0   zinc  gluconate 50 MG tablet Take 50 mg by mouth daily.     Ferrous Sulfate  (SLOW FE PO) Take 1 capsule by mouth every other day. (Patient not taking: Reported on 05/06/2024)     Multiple Vitamin (MULTIVITAMIN) tablet Take 1 tablet by mouth daily. One a day woman's (Patient not taking: Reported on 05/06/2024)     No current facility-administered medications for this visit.     PHYSICAL EXAMINATION: ECOG PERFORMANCE STATUS: 0 - Asymptomatic  Vitals:   05/06/24 1316  BP: (!) 140/65  Pulse: (!) 54  Resp: 14  Temp: 98.1 F (36.7 C)  SpO2: 99%   Filed Weights   05/06/24 1316  Weight: 190 lb 12.8 oz (86.5 kg)    GENERAL:alert, no distress and comfortable SKIN: skin color, texture, turgor are normal, no rashes or significant lesions EYES: normal, conjunctiva are pink and non-injected, sclera clear  OROPHARYNX:no exudate, no erythema and lips, buccal mucosa, and tongue normal  NECK: supple, thyroid  normal size, non-tender, without nodularity LYMPH:  no palpable lymphadenopathy in the cervical, axillary or  inguinal LUNGS: clear to auscultation and percussion with normal breathing effort HEART: regular rate & rhythm and no murmurs and no lower extremity edema ABDOMEN:abdomen soft, non-tender and normal bowel sounds Musculoskeletal:no cyanosis of digits and no clubbing  PSYCH: alert & oriented x 3 with fluent speech NEURO: no focal motor/sensory deficits  LABORATORY DATA:  I have reviewed the data as listed Lab Results  Component Value Date   WBC 4.9 03/29/2024   HGB 10.4 (L) 03/29/2024   HCT 32.9 (L) 03/29/2024   MCV 90 03/29/2024   PLT 310 03/29/2024     Chemistry      Component Value Date/Time   NA 143 06/11/2023 1522   K 3.9 06/11/2023 1522   CL 103 06/11/2023 1522   CO2 24 06/11/2023 1522   BUN 30 (H) 06/11/2023 1522   CREATININE 1.53 (H) 06/11/2023 1522   CREATININE 1.02 (H) 05/11/2020 1041      Component Value Date/Time   CALCIUM  10.0 06/11/2023 1522   ALKPHOS 106 01/13/2024 1108   AST 25 01/13/2024 1108   ALT 13 01/13/2024 1108   BILITOT 0.5 01/13/2024 1108       RADIOGRAPHIC STUDIES: I have personally reviewed the radiological images as listed and agreed with the findings in the report. US  RT BREAST BX W LOC DEV 1ST LESION IMG BX SPEC US  GUIDE Addendum Date: 04/15/2024 ADDENDUM REPORT: 04/15/2024 14:13 ADDENDUM: PATHOLOGY revealed: A. BREAST MASS, RIGHT 5:00; 5 CMFN, NEEDLE CORE BIOPSY: - Invasive ductal carcinoma, see comment - Overall Grade: 2 - Lymphovascular invasion: Not identified - Cancer Length: 0.6 cm - Calcifications: Present. Pathology results are CONCORDANT with imaging findings, per Aliene Lloyd M.D. Pathology results and recommendations were discussed with patient via telephone on 04/15/2024 by Rock Hover RN. Patient reported biopsy site doing well with no adverse symptoms, and slight tenderness at the site. Post biopsy care instructions were reviewed, questions were answered and my direct phone number was provided. Patient was instructed to call [facility] Mammography Department for any additional questions or concerns related to biopsy site. RECOMMENDATIONS: 1. Surgical and oncological consultation. Per patient request, surgical consultation was sent to Erminio Console at [facility] on 04/15/2025 by Rock Hover RN. Pathology results reported by Rock Hover RN on 04/15/2024. Electronically Signed   By: Aliene Lloyd M.D.   On: 04/15/2024 14:13   Result Date: 04/15/2024 CLINICAL DATA:  78 year old woman with suspicious 0.7 cm RIGHT breast mass (5 o'clock 5 CMFN) presents for ultrasound-guided core needle biopsy. EXAM: ULTRASOUND GUIDED RIGHT BREAST CORE NEEDLE BIOPSY COMPARISON:  Previous exam(s). PROCEDURE: I met with the patient and we discussed the procedure of ultrasound-guided biopsy, including benefits and alternatives. We discussed the high likelihood of a successful procedure. We discussed the risks of the procedure, including infection, bleeding, tissue injury, clip migration, and inadequate sampling. Informed written consent was given. The usual time-out protocol was performed immediately prior to the procedure. Lesion quadrant: Lower inner quadrant Using sterile technique and 1% Lidocaine  as local anesthetic, under direct ultrasound visualization, a 14 gauge spring-loaded device was used to perform biopsy of 0.7 cm RIGHT breast mass using an inferior approach. At the conclusion of the procedure ribbon shaped tissue marker clip was deployed into the biopsy cavity. Follow up 2 view mammogram was performed and dictated  separately. IMPRESSION: Ultrasound guided biopsy of 0.7 cm RIGHT breast mass (5 o'clock 5 CMFN). No apparent complications. Electronically Signed: By: Aliene Lloyd M.D. On: 04/14/2024 09:51   MM CLIP PLACEMENT RIGHT Result Date: 04/14/2024 CLINICAL DATA:  Status post ultrasound-guided core needle biopsy of RIGHT breast mass. EXAM: 3D DIAGNOSTIC RIGHT MAMMOGRAM POST ULTRASOUND BIOPSY COMPARISON:  Previous exam(s).  ACR Breast Density Category b: There are scattered areas of fibroglandular density. FINDINGS: 3D Mammographic images were obtained following ultrasound guided biopsy of RIGHT breast mass. The biopsy marking clip is in expected position at the site of biopsy. IMPRESSION: Appropriate positioning of the ribbon shaped biopsy marking clip at the site of biopsy in the lower-inner RIGHT breast. Final Assessment: Post Procedure Mammograms for Marker Placement Electronically Signed   By: Aliene Lloyd M.D.   On: 04/14/2024 09:50   MM 3D DIAGNOSTIC MAMMOGRAM UNILATERAL RIGHT BREAST Result Date: 04/07/2024 CLINICAL DATA:  78 year old woman with history of malignant RIGHT lumpectomy in 2006 for DCIS recalled for possible RIGHT breast asymmetry. EXAM: DIGITAL DIAGNOSTIC UNILATERAL RIGHT MAMMOGRAM WITH TOMOSYNTHESIS AND CAD; ULTRASOUND RIGHT BREAST LIMITED TECHNIQUE: Right digital diagnostic mammography and breast tomosynthesis was performed. The images were evaluated with computer-aided detection. ; Targeted ultrasound examination of the right breast was performed COMPARISON:  Previous exam(s). ACR Breast Density Category b: There are scattered areas of fibroglandular density. FINDINGS: RIGHT: Mammogram: Spot compression CC and MLO views of the RIGHT breast were obtained. Oval obscured mass persists in the lower inner RIGHT breast at posterior depth on additional images. Ultrasound: Targeted sonographic evaluation of the RIGHT breast demonstrates an oval mass with indistinct and microlobulated margins with posterior acoustic shadowing, measuring 0.7 x 0.5 x 0.5 cm at 5 o'clock 5 CMFN. This corresponds to the abnormality seen on mammogram. No enlarged RIGHT axillary lymph nodes were identified. IMPRESSION: Suspicious 0.7 cm RIGHT breast mass (5 o'clock 5 CMFN) corresponding to the abnormality seen on mammogram. RECOMMENDATION: Ultrasound-guided core needle biopsy of 0.7 cm RIGHT breast mass (5 o'clock 5 CMFN). I have discussed  the findings and recommendations with the patient. The biopsy procedure was explained to the patient and questions were answered. Patient expressed their understanding of the biopsy recommendation. Patient will be scheduled for biopsy at her earliest convenience by the schedulers. Ordering provider will be notified. If applicable, a reminder letter will be sent to the patient regarding the next appointment. BI-RADS CATEGORY  4: Suspicious. Electronically Signed   By: Aliene Lloyd M.D.   On: 04/07/2024 12:37   US  LIMITED ULTRASOUND INCLUDING AXILLA RIGHT BREAST Result Date: 04/07/2024 CLINICAL DATA:  78 year old woman with history of malignant RIGHT lumpectomy in 2006 for DCIS recalled for possible RIGHT breast asymmetry. EXAM: DIGITAL DIAGNOSTIC UNILATERAL RIGHT MAMMOGRAM WITH TOMOSYNTHESIS AND CAD; ULTRASOUND RIGHT BREAST LIMITED TECHNIQUE: Right digital diagnostic mammography and breast tomosynthesis was performed. The images were evaluated with computer-aided detection. ; Targeted ultrasound examination of the right breast was performed COMPARISON:  Previous exam(s). ACR Breast Density Category b: There are scattered areas of fibroglandular density. FINDINGS: RIGHT: Mammogram: Spot compression CC and MLO views of the RIGHT breast were obtained. Oval obscured mass persists in the lower inner RIGHT breast at posterior depth on additional images. Ultrasound: Targeted sonographic evaluation of the RIGHT breast demonstrates an oval mass with indistinct and microlobulated margins with posterior acoustic shadowing, measuring 0.7 x 0.5 x 0.5 cm at 5 o'clock 5 CMFN. This corresponds to the abnormality seen on mammogram. No enlarged RIGHT axillary lymph nodes were identified.  IMPRESSION: Suspicious 0.7 cm RIGHT breast mass (5 o'clock 5 CMFN) corresponding to the abnormality seen on mammogram. RECOMMENDATION: Ultrasound-guided core needle biopsy of 0.7 cm RIGHT breast mass (5 o'clock 5 CMFN). I have discussed the  findings and recommendations with the patient. The biopsy procedure was explained to the patient and questions were answered. Patient expressed their understanding of the biopsy recommendation. Patient will be scheduled for biopsy at her earliest convenience by the schedulers. Ordering provider will be notified. If applicable, a reminder letter will be sent to the patient regarding the next appointment. BI-RADS CATEGORY  4: Suspicious. Electronically Signed   By: Aliene Lloyd M.D.   On: 04/07/2024 12:37    All questions were answered. The patient knows to call the clinic with any problems, questions or concerns. I spent 45 minutes in the care of this patient including H and P, review of records, counseling and coordination of care.     Amber Stalls, MD 05/06/2024 3:16 PM

## 2024-05-09 ENCOUNTER — Encounter: Payer: Self-pay | Admitting: *Deleted

## 2024-05-10 ENCOUNTER — Ambulatory Visit: Admitting: General Practice

## 2024-05-10 ENCOUNTER — Telehealth: Payer: Self-pay | Admitting: Licensed Clinical Social Worker

## 2024-05-10 ENCOUNTER — Ambulatory Visit: Attending: Cardiology

## 2024-05-10 DIAGNOSIS — Z01810 Encounter for preprocedural cardiovascular examination: Secondary | ICD-10-CM | POA: Diagnosis not present

## 2024-05-10 NOTE — Progress Notes (Signed)
 Virtual Visit via Telephone Note   Because of Desiree [name] co-morbid illnesses, she is at least at moderate risk for complications without adequate follow up.  This format is felt to be most appropriate for this patient at this time.  Due to technical limitations with video connection (technology), today's appointment will be conducted as an audio only telehealth visit, and Desiree [name] verbally agreed to proceed in this manner.   All issues noted in this document were discussed and addressed.  No physical exam could be performed with this format.  Evaluation Performed:  Preoperative cardiovascular risk assessment _____________   Date:  05/10/2024   Patient ID:  Desiree [name] Finder, DOB 25-Oct-1945, MRN [id number] Patient Location:  Home Provider location:   Office  Primary Care Provider:  [name], MD Primary Cardiologist:  Jerel Balding, MD  Chief Complaint / Patient Profile  78 y.o. y/o female with a h/o hypertension, moderate aneurysm of the ascending aorta, CKD stage III, hypercholesterolemia on statin without known coronary vascular disease, remote history of ischemic right hemispheric stroke who is pending right breast cancer lumpectomy with Dr. Deward Kicks III and presents today for telephonic preoperative cardiovascular risk assessment. History of Present Illness  Desiree [name] is a 78 y.o. female who presents via audio/video conferencing for a telehealth visit today.  Pt was last seen in cardiology clinic on 07/29/23 by Dr. Balding.  At that time [name] was doing well.  The patient is now pending procedure as outlined above. Since her last visit, she has been stable from a cardiac standpoint. Today she denies chest pain, shortness of breath, lower extremity edema, fatigue, palpitations, melena, hematuria, hemoptysis, diaphoresis, weakness, presyncope, syncope, orthopnea, and PND.  She is able to achieve greater than 4 METS of activity with ADLs, climbing of  stairs and household chores. 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--  S/P  RIGHT MASTECTMOY AND RADIATION--  NO RECURRANCE   History of CVA (cerebrovascular accident) PER SCAN IN 2011   Hyperlipidemia    Hypertension    Impaired fasting glucose PER PCP  DR SIMPSON   WATCH DIET   Mixed urge and stress incontinence    Nocturia    Sinus drainage    Thoracic ascending aortic aneurysm (HCC) LAST CHEST CT 09-02-2010--  FOLLOWED BY  CARDIOLOGIST--  DR [id number]   Vaginal wall prolapse    Past Surgical History:  Procedure Laterality Date   ABDOMINAL HYSTERECTOMY     BILATERAL CARPAL TUNNEL RELEASE  1980'S   BREAST BIOPSY Right 04/14/2024   US  RT BREAST BX W LOC DEV 1ST LESION IMG BX SPEC US  GUIDE 04/14/2024 [facility]   CARDIAC CATHETERIZATION  02-01-2009  DR EICHHORN   NORMAL CORONARY ARTERIES/ NORMAL LV SIZE AND FUNCTION/ AORTIC ROOT SIZE AT THE UPPER LIMIT OF NORMAL    CARPAL TUNNEL RELEASE     CERVICAL DISC SURGERY  12-12-2005  DR STERN   LEFT  C6 - C7 HERINATED / DDD/ SPONDYLOSIS   COLONOSCOPY N/A 02/10/2014   Surgeon:  Margo LITTIE Haddock, MD; Normal TI, mild diverticulosis in sigmoid and transverse colon, small external hemorrhoids, no obvious source for anemia.  Repeat colonoscopy in 10-15 years if benefits outweigh risk.   COLONOSCOPY N/A 04/19/2024   Procedure: COLONOSCOPY;  Surgeon: [name], DO;  Location: [facility];  Service: Endoscopy;  Laterality: N/A;  9:00 AM,  OK RM 1-2   CYSTOCELE REPAIR  08/02/2012   Procedure: ANTERIOR REPAIR (CYSTOCELE);  Surgeon: Arlena LILLETTE Gal,  MD;  Location: [facility];  Service: Urology;  Laterality: N/A;  [employer] Uphold Anterior Pelvic Floor Sacrospinus Repair. Anterior wall of the vagina.   ESOPHAGOGASTRODUODENOSCOPY N/A 02/10/2014   Surgeon: Margo LITTIE Haddock, MD;   Mild gastritis/duodenitis s/p biopsied, no obvious source for anemia.  Pathology with mild chronic gastritis with intestinal metaplasia, negative for H. pylori, duodenal biopsy benign.   GIVENS CAPSULE STUDY N/A 03/13/2014   Surgeon: Margo LITTIE Haddock, MD;  Normal exam.  Suspected anemia most likely due to chronic disease.   JOINT REPLACEMENT Right    [employer] 06/16/2020- knee   NECK SURGERY  2007   RIGHT PARTIAL MASTECTOMY  11-11-2004  DR PETER YOUNG   DUCTAL CARCINOMA IN SITU RIGHT BREAST   TOTAL ABDOMINAL HYSTERECTOMY W/ BILATERAL SALPINGOOPHORECTOMY  1990   TOTAL KNEE ARTHROPLASTY Right 12/21/2019   Procedure: RIGHT  TOTAL KNEE ARTHROPLASTY;  Surgeon: Barbarann Oneil BROCKS, MD;  Location: [facility];  Service: Orthopedics;  Laterality: Right;   TRANSTHORACIC ECHOCARDIOGRAM  03-30-2008  DR MARGARET SIMPSON   LV SIZE AND FUNCTION NORMAL/ MODERATE AORTIC ARCH DILATATION/ MILD MR    Allergies  Allergies  Allergen Reactions   Codeine Other (See Comments)    CONFUSION/ DIZZY Other reaction(s): Other (see comments) CONFUSION/ DIZZY   Quinolones Other (See Comments)    Patient was warned about not using Cipro  and similar antibiotics. Recent studies have raised concern that fluoroquinolone antibiotics could be associated with an increased risk of aortic aneurysm Fluoroquinolones have non-antimicrobial properties that might jeopardise the integrity of the extracellular matrix of the vascular wall I. Other reaction(s): Other (see comments) Patient was warned about not using Cipro  and similar antibiotics. Recent studies have raised concern that fluoroquinolone antibiotics could be associated with an increased risk of aortic aneurysm Fluoroquinolones have  non-antimicrobial properties that might jeopardise the integrity of the extracellular matrix of the vascular wall I.    Home Medications    Prior to Admission medications   Medication Sig Start Date End Date Taking? Authorizing Provider  acetaminophen  (TYLENOL ) 500 MG tablet Take one to  two tablets by mouth at night for pain 02/20/20   [name], MD  aspirin  EC 325 MG tablet Take 1 tablet (325 mg total) by mouth daily. 02/21/13   [name], MD  carvedilol  (COREG ) 3.125 MG tablet TAKE 1 TABLET(3.125 MG) BY MOUTH TWICE DAILY WITH A MEAL 04/07/24   Croitoru, Mihai, MD  chlorthalidone  (HYGROTON ) 50 MG tablet Take 1 tablet (50 mg total) by mouth daily. 01/13/24   [name], MD  Cholecalciferol (VITAMIN D ) 50 MCG (2000 UT) tablet Take 2,000 Units by mouth daily.     [provider]  Ferrous Sulfate  (SLOW FE PO) Take 1 capsule by mouth every other day. Patient not taking: Reported on 05/06/2024    [provider]  loratadine  (CLARITIN ) 10 MG tablet Take one tablet by mouth  one to two times daily as needed for uncontrolled allergies 07/16/23   [name], MD  losartan  (COZAAR ) 50 MG tablet Take 1 tablet (50 mg total) by mouth daily. 09/16/23   [name], MD  lovastatin  (MEVACOR ) 40 MG tablet Take 1 tablet (40 mg total) by mouth daily. 07/16/23   [name], MD  montelukast  (SINGULAIR ) 10 MG tablet Take 10 mg by mouth at bedtime.    [provider]  Multiple Vitamin (MULTIVITAMIN) tablet Take 1 tablet by mouth daily. One a day woman's Patient not taking: Reported on 05/06/2024    [provider]  terbinafine  (LAMISIL ) 1 % cream Apply twice daily to rash on scalp for 1 week, then , as needed 06/13/22   [name], MD  tiZANidine  (ZANAFLEX ) 2 MG tablet Take 1 tablet (2 mg total) by mouth every 8 (eight) hours as needed for muscle spasms. 01/13/24   [name], MD  zinc  gluconate 50 MG tablet Take 50  mg by mouth daily.    [provider]    Physical Exam  Vital Signs:  CRUZITA LIPA does not have vital signs available for review today. Given telephonic nature of communication, physical exam is limited. AAOx3. NAD. Normal affect.  Speech and respirations are unlabored. Accessory Clinical Findings   None Assessment & Plan    1.  Preoperative Cardiovascular Risk Assessment: Ms. Foulks perioperative risk of a major cardiac event is 0.9% according to the Revised Cardiac Risk Index (RCRI).  Therefore, she is at low risk for perioperative complications.   Her functional capacity is good at 6.79 METs according to the [facility] Activity Status Index (DASI). Recommendations: According to ACC/AHA guidelines, no further cardiovascular testing needed.  The patient may proceed to surgery at acceptable risk.   Antiplatelet and/or Anticoagulation Recommendations: Patient's aspirin  has not been prescribed by cardiology, from a cardiac standpoint she can hold aspirin  for 7 days prior to procedure however it appears that her aspirin  has been prescribed at her PCP given history of remote stroke.  Final recommendations regarding holding of aspirin  should come from patient's PCP.  The patient was advised that if she develops new symptoms prior to surgery to contact our office to arrange for a follow-up visit, and she verbalized understanding. A copy of this note will be routed to requesting surgeon.  Time:   Today, I have spent 10 minutes with the patient with telehealth technology discussing medical history, symptoms, and management plan.    Brynnlee Cumpian D Areeb Corron, NP  05/10/2024, 12:06 PM

## 2024-05-10 NOTE — Telephone Encounter (Signed)
[facility] Clinical Social Work  Clinical Social Work was referred by new patient protocol for assessment of psychosocial needs.  Clinical Social Worker attempted to contact patient by phone  to offer support and assess for needs.   No answer. Left VM with direct contact information.      Emelia Sandoval E ZAVALA, LCSW  Clinical Social Worker [facility]        

## 2024-05-12 ENCOUNTER — Telehealth: Payer: Self-pay | Admitting: Family Medicine

## 2024-05-12 NOTE — Telephone Encounter (Signed)
 Clinical  please advise if needs to be seen before December with another  provider   Medical clearance  lumpetomy surgery  Noted Copied Sleeved Original placed in provider box Copy placed in front desk folder

## 2024-05-12 NOTE — Telephone Encounter (Signed)
 Called patient got her scheduled another provider she would like to have appt asap

## 2024-05-13 ENCOUNTER — Ambulatory Visit (INDEPENDENT_AMBULATORY_CARE_PROVIDER_SITE_OTHER): Admitting: Internal Medicine

## 2024-05-13 ENCOUNTER — Encounter: Payer: Self-pay | Admitting: Internal Medicine

## 2024-05-13 VITALS — BP 119/78 | HR 66 | Ht 61.5 in | Wt 192.8 lb

## 2024-05-13 DIAGNOSIS — N1832 Chronic kidney disease, stage 3b: Secondary | ICD-10-CM

## 2024-05-13 DIAGNOSIS — Z23 Encounter for immunization: Secondary | ICD-10-CM | POA: Diagnosis not present

## 2024-05-13 DIAGNOSIS — C50311 Malignant neoplasm of lower-inner quadrant of right female breast: Secondary | ICD-10-CM | POA: Diagnosis not present

## 2024-05-13 DIAGNOSIS — Z170 Estrogen receptor positive status [ER+]: Secondary | ICD-10-CM

## 2024-05-13 DIAGNOSIS — I10 Essential (primary) hypertension: Secondary | ICD-10-CM

## 2024-05-13 DIAGNOSIS — Z01818 Encounter for other preprocedural examination: Secondary | ICD-10-CM | POA: Diagnosis not present

## 2024-05-13 NOTE — Assessment & Plan Note (Addendum)
 RCRI: 1 DASI: 5.62 METs EKG: Sinus bradycardia, HR 51. (On Coreg ).  No signs of active ischemia.  Hold Aspirin  5 days prior to the procedure (?h/o TIA)  She is medically optimized for right breast lumpectomy under general anesthesia with moderate risk - letter sent to General Surgery office as requested

## 2024-05-13 NOTE — Patient Instructions (Signed)
 Please stop taking Aspirin  5 days prior to the procedure.  Please continue to take medications as prescribed.  Please continue to follow low salt diet and ambulate as tolerated.

## 2024-05-13 NOTE — Assessment & Plan Note (Addendum)
 Last BMP reviewed Avoid oral NSAIDs Maintain adequate hydration Followed by nephrology

## 2024-05-13 NOTE — Assessment & Plan Note (Signed)
 Followed by general surgery, oncology and radiation oncology

## 2024-05-13 NOTE — Progress Notes (Signed)
 Established Patient Office Visit  Subjective:  Patient ID: Desiree [name], female    DOB: 1946-05-26  Age: 78 y.o. MRN: [id number]  CC:  Chief Complaint  Patient presents with   Medical Clearance    [facility] patient, needing forms completed for medical clearance.     HPI Desiree [name] is a 78 y.o. female with past medical history of HTN, CKD, breast ca. and obesity who presents for preoperative evaluation.  She is planned to get right breast lumpectomy due to history of ER/PR positive right breast carcinoma.  HTN: BP is well-controlled. Takes medications regularly. Patient denies headache, dizziness, chest pain, dyspnea or palpitations.  She takes aspirin  81 mg QD due to history of questionable TIA.  She also takes lovastatin  40 mg QD.  She is able to complete her household chores and walk at same level without any difficulty.  She has mild shortness of breath upon walking uphill at times.  CKD: Her GFR has been stable around 45.  Followed by Dr. Rachele.  Denies any dysuria, hematuria or urinary hesitancy or resistance currently.  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--  S/P  RIGHT MASTECTMOY AND RADIATION--  NO RECURRANCE   History of CVA (cerebrovascular accident) PER SCAN IN 2011   Hyperlipidemia    Hypertension    Impaired fasting glucose PER PCP  DR ANTONETTA   WATCH DIET   Mixed urge and stress incontinence    Nocturia    Sinus drainage    Thoracic ascending aortic aneurysm (HCC) LAST CHEST CT 09-02-2010--  FOLLOWED BY  CARDIOLOGIST--  DR [id number]   Vaginal wall prolapse     Past Surgical History:   Procedure Laterality Date   ABDOMINAL HYSTERECTOMY     BILATERAL CARPAL TUNNEL RELEASE  1980'S   BREAST BIOPSY Right 04/14/2024   US  RT BREAST BX W LOC DEV 1ST LESION IMG BX SPEC US  GUIDE 04/14/2024 [facility]   CARDIAC CATHETERIZATION  02-01-2009  DR EICHHORN   NORMAL CORONARY ARTERIES/ NORMAL LV SIZE AND FUNCTION/ AORTIC ROOT SIZE AT THE UPPER LIMIT OF NORMAL    CARPAL TUNNEL RELEASE     CERVICAL DISC SURGERY  12-12-2005  DR UNICE   LEFT  C6 - C7 HERINATED / DDD/ SPONDYLOSIS   COLONOSCOPY N/A 02/10/2014   Surgeon: Margo LITTIE Haddock, MD; Normal TI, mild diverticulosis in sigmoid and transverse colon, small external hemorrhoids, no obvious source for anemia.  Repeat colonoscopy in 10-15 years if benefits outweigh risk.   COLONOSCOPY N/A 04/19/2024   Procedure: COLONOSCOPY;  Surgeon: [name], DO;  Location: [facility];  Service: Endoscopy;  Laterality: N/A;  9:00 AM,  OK RM 1-2   CYSTOCELE REPAIR  08/02/2012   Procedure: ANTERIOR REPAIR (CYSTOCELE);  Surgeon: Arlena LILLETTE Gal, MD;  Location: [facility];  Service: Urology;  Laterality: N/A;  [employer] Uphold Anterior Pelvic Floor Sacrospinus Repair. Anterior wall of the vagina.   ESOPHAGOGASTRODUODENOSCOPY N/A 02/10/2014   Surgeon: Margo LITTIE Haddock, MD;   Mild gastritis/duodenitis s/p biopsied, no obvious source for anemia.  Pathology  with mild chronic gastritis with intestinal metaplasia, negative for H. pylori, duodenal biopsy benign.   GIVENS CAPSULE STUDY N/A 03/13/2014   Surgeon: Margo LITTIE Haddock, MD;  Normal exam.  Suspected anemia most likely due to chronic disease.   JOINT REPLACEMENT Right    [employer] 06/16/2020- knee   NECK SURGERY  2007   RIGHT PARTIAL MASTECTOMY  11-11-2004  DR PETER YOUNG   DUCTAL CARCINOMA IN SITU RIGHT BREAST   TOTAL ABDOMINAL HYSTERECTOMY W/ BILATERAL SALPINGOOPHORECTOMY  1990   TOTAL KNEE ARTHROPLASTY Right 12/21/2019   Procedure: RIGHT  TOTAL KNEE ARTHROPLASTY;  Surgeon:  Barbarann Oneil BROCKS, MD;  Location: [facility];  Service: Orthopedics;  Laterality: Right;   TRANSTHORACIC ECHOCARDIOGRAM  03-30-2008  DR MARGARET SIMPSON   LV SIZE AND FUNCTION NORMAL/ MODERATE AORTIC ARCH DILATATION/ MILD MR    Family History  Problem Relation Age of Onset   Kidney failure Mother    Hypertension Mother    Heart attack Mother 47   Throat cancer Father    Prostate cancer Father    Lung cancer Father 75   Cancer Sister        breast   Breast cancer Sister    Hypertension Brother        2 stents   Colon cancer Neg Hx    Colon polyps Neg Hx     Social History   Socioeconomic History   Marital status: Divorced    Spouse name: Not on file   Number of children: 4   Years of education: 12+   Highest education level: Some college, no degree  Occupational History   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  Tobacco Use   Smoking status: Never   Smokeless tobacco: Never  Vaping Use   Vaping status: Never Used  Substance and Sexual Activity   Alcohol  use: No   Drug use: No   Sexual activity: Never  Other Topics Concern   Not on file  Social History Narrative   RETIRED IN June 2020.    Social Drivers of Corporate investment banker Strain: Low Risk  (05/06/2024)   Overall Financial Resource Strain (CARDIA)    Difficulty of Paying Living Expenses: Not hard at all  Food Insecurity: No Food Insecurity (05/06/2024)   Hunger Vital Sign    Worried About Running Out of Food in the Last Year: Never true    Ran Out of Food in the Last Year: Never true  Transportation Needs: No Transportation Needs (05/02/2024)   PRAPARE - Administrator, Civil Service (Medical): No    Lack of Transportation (Non-Medical): No  Physical Activity: Insufficiently Active (07/05/2021)   Exercise Vital Sign    Days of Exercise per Week: 3 days    Minutes of Exercise per Session: 30 min  Stress: No Stress Concern Present (06/19/2020)   [employer] of Occupational Health - Occupational Stress Questionnaire    Feeling of Stress : Not at all  Social Connections: Moderately Integrated (05/06/2024)   Social Connection and Isolation Panel    Frequency of Communication with Friends and Family: More than three times a week    Frequency of Social Gatherings with Friends and Family: More than three times a week    Attends Religious Services: More than 4 times per year    Active Member of [employer] or Organizations: Yes    Attends Banker Meetings: More than 4 times per  year    Marital Status: Divorced  Catering manager Violence: Not At Risk (05/06/2024)   Humiliation, Afraid, Rape, and Kick questionnaire    Fear of Current or Ex-Partner: No    Emotionally Abused: No    Physically Abused: No    Sexually Abused: No    Outpatient Medications Prior to Visit  Medication Sig Dispense Refill   acetaminophen  (TYLENOL ) 500 MG tablet Take one to  two tablets by mouth at night for pain 50 tablet 0   aspirin  EC 325 MG tablet Take 1 tablet (325 mg total) by mouth daily. 100 tablet 3   carvedilol  (COREG ) 3.125 MG tablet TAKE 1 TABLET(3.125 MG) BY MOUTH TWICE DAILY WITH A MEAL 180 tablet 1   chlorthalidone  (HYGROTON ) 50 MG tablet Take 1 tablet (50 mg total) by mouth daily. 90 tablet 3   Cholecalciferol (VITAMIN D ) 50 MCG (2000 UT) tablet Take 2,000 Units by mouth daily.      loratadine  (CLARITIN ) 10 MG tablet Take one tablet by mouth  one to two times daily as needed for uncontrolled allergies 90 tablet 2   losartan  (COZAAR ) 50 MG tablet Take 1 tablet (50 mg total) by mouth daily. 90 tablet 3   lovastatin  (MEVACOR ) 40 MG tablet Take 1 tablet (40 mg total) by mouth daily. 90 tablet 2   montelukast  (SINGULAIR ) 10 MG tablet Take 10 mg by mouth at bedtime.     terbinafine  (LAMISIL ) 1 % cream Apply twice daily to rash on scalp for 1 week, then , as needed 42 g 0   tiZANidine  (ZANAFLEX ) 2 MG tablet Take 1 tablet (2 mg total) by mouth every 8  (eight) hours as needed for muscle spasms. 30 tablet 0   zinc  gluconate 50 MG tablet Take 50 mg by mouth daily.     Ferrous Sulfate  (SLOW FE PO) Take 1 capsule by mouth every other day. (Patient not taking: Reported on 05/13/2024)     Multiple Vitamin (MULTIVITAMIN) tablet Take 1 tablet by mouth daily. One a day woman's (Patient not taking: Reported on 05/13/2024)     No facility-administered medications prior to visit.    Allergies  Allergen Reactions   Codeine Other (See Comments)    CONFUSION/ DIZZY Other reaction(s): Other (see comments) CONFUSION/ DIZZY   Quinolones Other (See Comments)    Patient was warned about not using Cipro  and similar antibiotics. Recent studies have raised concern that fluoroquinolone antibiotics could be associated with an increased risk of aortic aneurysm Fluoroquinolones have non-antimicrobial properties that might jeopardise the integrity of the extracellular matrix of the vascular wall I. Other reaction(s): Other (see comments) Patient was warned about not using Cipro  and similar antibiotics. Recent studies have raised concern that fluoroquinolone antibiotics could be associated with an increased risk of aortic aneurysm Fluoroquinolones have non-antimicrobial properties that might jeopardise the integrity of the extracellular matrix of the vascular wall I.    ROS Review of Systems  Constitutional:  Negative for chills and fever.  HENT:  Negative for congestion and sore throat.   Eyes:  Negative for pain and discharge.  Respiratory:  Negative for cough and shortness of breath.   Cardiovascular:  Negative for chest pain and palpitations.  Gastrointestinal:  Negative for abdominal pain, diarrhea, nausea and vomiting.  Endocrine: Negative for polydipsia and polyuria.  Genitourinary:  Negative for dysuria and hematuria.  Musculoskeletal:  Negative for neck pain and neck stiffness.  Skin:  Negative for rash.  Neurological:  Negative for dizziness and  weakness.  Psychiatric/Behavioral:  Negative for agitation and behavioral problems.       Objective:    Physical Exam Vitals reviewed.  Constitutional:      General: She is not in acute distress.    Appearance: She is not diaphoretic.  HENT:     Head: Normocephalic and atraumatic.     Nose: Nose normal. No congestion.     Mouth/Throat:     Mouth: Mucous membranes are moist.     Pharynx: No posterior oropharyngeal erythema.  Eyes:     General: No scleral icterus.    Extraocular Movements: Extraocular movements intact.  Cardiovascular:     Rate and Rhythm: Normal rate and regular rhythm.     Heart sounds: Normal heart sounds. No murmur heard. Pulmonary:     Breath sounds: Normal breath sounds. No wheezing or rales.  Musculoskeletal:     Cervical back: Neck supple. No tenderness.     Right lower leg: No edema.     Left lower leg: No edema.  Skin:    General: Skin is warm.     Findings: No rash.  Neurological:     General: No focal deficit present.     Mental Status: She is alert and oriented to person, place, and time.  Psychiatric:        Mood and Affect: Mood normal.        Behavior: Behavior normal.     BP 119/78   Pulse 66   Ht 5' 1.5 (1.562 m)   Wt 192 lb 12.8 oz (87.5 kg)   SpO2 99%   BMI 35.84 kg/m  Wt Readings from Last 3 Encounters:  05/13/24 192 lb 12.8 oz (87.5 kg)  05/06/24 190 lb 12.8 oz (86.5 kg)  05/02/24 193 lb (87.5 kg)    Lab Results  Component Value Date   TSH 4.610 (H) 01/13/2024   Lab Results  Component Value Date   WBC 4.9 03/29/2024   HGB 10.4 (L) 03/29/2024   HCT 32.9 (L) 03/29/2024   MCV 90 03/29/2024   PLT 310 03/29/2024   Lab Results  Component Value Date   NA 143 06/11/2023   K 3.9 06/11/2023   CO2 24 06/11/2023   GLUCOSE 92 06/11/2023   BUN 30 (H) 06/11/2023   CREATININE 1.53 (H) 06/11/2023   BILITOT 0.5 01/13/2024   ALKPHOS 106 01/13/2024   AST 25 01/13/2024   ALT 13 01/13/2024   PROT 6.9 01/13/2024   ALBUMIN  4.2 01/13/2024   CALCIUM  10.0 06/11/2023   ANIONGAP 11 12/14/2019   EGFR 35 (L) 06/11/2023   Lab Results  Component Value Date   CHOL 168 01/13/2024   Lab Results  Component Value Date   HDL 87 01/13/2024   Lab Results  Component Value Date   LDLCALC 71 01/13/2024   Lab Results  Component Value Date   TRIG 44 01/13/2024   Lab Results  Component Value Date   CHOLHDL 1.9 01/13/2024   Lab Results  Component Value Date   HGBA1C 5.6 05/27/2014      Assessment & Plan:   Problem List Items Addressed This Visit       Cardiovascular and Mediastinum   Essential hypertension   BP Readings from Last 1 Encounters:  05/13/24 119/78   Well-controlled with losartan  50 mg QD, chlorthalidone  50 mg QD and Coreg  3.125 mg BID Counseled for compliance with the medications Advised DASH diet and moderate exercise/walking as tolerated       Relevant Orders  EKG 12-Lead (Completed)     Genitourinary   Stage 3b chronic kidney disease (CKD) (HCC)   Last BMP reviewed Avoid oral NSAIDs Maintain adequate hydration Followed by nephrology        Other   Malignant neoplasm of lower-inner quadrant of right breast of female, estrogen receptor positive (HCC)   Followed by general surgery, oncology and radiation oncology      Preop examination - Primary   RCRI: 1 DASI: 5.62 METs EKG: Sinus bradycardia, HR 51. (On Coreg ).  No signs of active ischemia.  Hold Aspirin  5 days prior to the procedure (?h/o TIA)  She is medically optimized for right breast lumpectomy under general anesthesia with moderate risk - letter sent to General Surgery office as requested      Relevant Orders   EKG 12-Lead (Completed)   Other Visit Diagnoses       Encounter for immunization       Relevant Orders   Flu vaccine HIGH DOSE PF(Fluzone Trivalent) (Completed)       No orders of the defined types were placed in this encounter.   Follow-up: No follow-ups on file.    Desiree MARLA Blanch, MD

## 2024-05-13 NOTE — Assessment & Plan Note (Signed)
 BP Readings from Last 1 Encounters:  05/13/24 119/78   Well-controlled with losartan  50 mg QD, chlorthalidone  50 mg QD and Coreg  3.125 mg BID Counseled for compliance with the medications Advised DASH diet and moderate exercise/walking as tolerated

## 2024-05-16 ENCOUNTER — Other Ambulatory Visit: Payer: Self-pay | Admitting: General Surgery

## 2024-05-16 DIAGNOSIS — C50311 Malignant neoplasm of lower-inner quadrant of right female breast: Secondary | ICD-10-CM

## 2024-05-17 ENCOUNTER — Encounter: Payer: Self-pay | Admitting: *Deleted

## 2024-05-26 ENCOUNTER — Ambulatory Visit: Admitting: Internal Medicine

## 2024-05-30 ENCOUNTER — Other Ambulatory Visit: Payer: Self-pay | Admitting: Family Medicine

## 2024-06-03 ENCOUNTER — Inpatient Hospital Stay: Admitting: Hematology and Oncology

## 2024-06-06 ENCOUNTER — Encounter (HOSPITAL_BASED_OUTPATIENT_CLINIC_OR_DEPARTMENT_OTHER): Payer: Self-pay | Admitting: General Surgery

## 2024-06-06 ENCOUNTER — Other Ambulatory Visit: Payer: Self-pay

## 2024-06-09 ENCOUNTER — Encounter (HOSPITAL_BASED_OUTPATIENT_CLINIC_OR_DEPARTMENT_OTHER)
Admission: RE | Admit: 2024-06-09 | Discharge: 2024-06-09 | Disposition: A | Discharge status: Home / Self Care | Source: Ambulatory Visit | Attending: General Surgery | Admitting: General Surgery

## 2024-06-09 ENCOUNTER — Encounter: Payer: Self-pay | Admitting: Family Medicine

## 2024-06-09 DIAGNOSIS — Z01812 Encounter for preprocedural laboratory examination: Secondary | ICD-10-CM | POA: Insufficient documentation

## 2024-06-09 DIAGNOSIS — H25813 Combined forms of age-related cataract, bilateral: Secondary | ICD-10-CM | POA: Diagnosis not present

## 2024-06-09 DIAGNOSIS — H524 Presbyopia: Secondary | ICD-10-CM | POA: Diagnosis not present

## 2024-06-09 DIAGNOSIS — H04123 Dry eye syndrome of bilateral lacrimal glands: Secondary | ICD-10-CM | POA: Diagnosis not present

## 2024-06-09 DIAGNOSIS — H40033 Anatomical narrow angle, bilateral: Secondary | ICD-10-CM | POA: Diagnosis not present

## 2024-06-09 DIAGNOSIS — I10 Essential (primary) hypertension: Secondary | ICD-10-CM | POA: Diagnosis not present

## 2024-06-09 DIAGNOSIS — H35362 Drusen (degenerative) of macula, left eye: Secondary | ICD-10-CM | POA: Diagnosis not present

## 2024-06-09 LAB — BASIC METABOLIC PANEL WITH GFR
Anion gap: 9 (ref 5–15)
BUN: 36 mg/dL — ABNORMAL HIGH (ref 8–23)
CO2: 27 mmol/L (ref 22–32)
Calcium: 9.2 mg/dL (ref 8.9–10.3)
Chloride: 103 mmol/L (ref 98–111)
Creatinine, Ser: 1.76 mg/dL — ABNORMAL HIGH (ref 0.44–1.00)
GFR, Estimated: 29 mL/min — ABNORMAL LOW (ref >60)
Glucose, Bld: 121 mg/dL — ABNORMAL HIGH (ref 70–99)
Potassium: 3.7 mmol/L (ref 3.5–5.1)
Sodium: 139 mmol/L (ref 135–145)

## 2024-06-09 LAB — OPHTHALMOLOGY REPORT-SCANNED

## 2024-06-09 MED ORDER — CHLORHEXIDINE GLUCONATE CLOTH 2 % EX PADS: 6.0000 | MEDICATED_PAD | Freq: Once | CUTANEOUS | Status: DC

## 2024-06-09 NOTE — Progress Notes (Signed)

## 2024-06-10 ENCOUNTER — Ambulatory Visit
Admission: RE | Admit: 2024-06-10 | Discharge: 2024-06-10 | Disposition: A | Discharge status: Home / Self Care | Source: Ambulatory Visit | Attending: General Surgery | Admitting: General Surgery

## 2024-06-10 DIAGNOSIS — C50911 Malignant neoplasm of unspecified site of right female breast: Secondary | ICD-10-CM | POA: Diagnosis not present

## 2024-06-10 DIAGNOSIS — Z170 Estrogen receptor positive status [ER+]: Secondary | ICD-10-CM

## 2024-06-10 HISTORY — PX: BREAST BIOPSY: SHX20

## 2024-06-13 ENCOUNTER — Encounter (HOSPITAL_BASED_OUTPATIENT_CLINIC_OR_DEPARTMENT_OTHER)
Admission: RE | Disposition: A | Discharge status: Home / Self Care | Payer: Self-pay | Source: Home / Self Care | Attending: General Surgery

## 2024-06-13 ENCOUNTER — Encounter (HOSPITAL_BASED_OUTPATIENT_CLINIC_OR_DEPARTMENT_OTHER): Payer: Self-pay | Admitting: General Surgery

## 2024-06-13 ENCOUNTER — Ambulatory Visit
Admission: RE | Admit: 2024-06-13 | Discharge: 2024-06-13 | Disposition: A | Discharge status: Home / Self Care | Source: Ambulatory Visit | Attending: General Surgery | Admitting: General Surgery

## 2024-06-13 ENCOUNTER — Ambulatory Visit (HOSPITAL_BASED_OUTPATIENT_CLINIC_OR_DEPARTMENT_OTHER): Admitting: Anesthesiology

## 2024-06-13 ENCOUNTER — Ambulatory Visit (HOSPITAL_BASED_OUTPATIENT_CLINIC_OR_DEPARTMENT_OTHER)
Admission: RE | Admit: 2024-06-13 | Discharge: 2024-06-13 | Disposition: A | Discharge status: Home / Self Care | Attending: General Surgery | Admitting: General Surgery

## 2024-06-13 DIAGNOSIS — C50911 Malignant neoplasm of unspecified site of right female breast: Secondary | ICD-10-CM | POA: Diagnosis not present

## 2024-06-13 DIAGNOSIS — C50311 Malignant neoplasm of lower-inner quadrant of right female breast: Secondary | ICD-10-CM | POA: Insufficient documentation

## 2024-06-13 DIAGNOSIS — Z79899 Other long term (current) drug therapy: Secondary | ICD-10-CM | POA: Insufficient documentation

## 2024-06-13 DIAGNOSIS — Z1732 Human epidermal growth factor receptor 2 negative status: Secondary | ICD-10-CM | POA: Insufficient documentation

## 2024-06-13 DIAGNOSIS — Z803 Family history of malignant neoplasm of breast: Secondary | ICD-10-CM | POA: Diagnosis not present

## 2024-06-13 DIAGNOSIS — M1990 Unspecified osteoarthritis, unspecified site: Secondary | ICD-10-CM | POA: Insufficient documentation

## 2024-06-13 DIAGNOSIS — Z170 Estrogen receptor positive status [ER+]: Secondary | ICD-10-CM | POA: Diagnosis not present

## 2024-06-13 DIAGNOSIS — Z1721 Progesterone receptor positive status: Secondary | ICD-10-CM | POA: Insufficient documentation

## 2024-06-13 DIAGNOSIS — I10 Essential (primary) hypertension: Secondary | ICD-10-CM | POA: Diagnosis not present

## 2024-06-13 DIAGNOSIS — K219 Gastro-esophageal reflux disease without esophagitis: Secondary | ICD-10-CM | POA: Insufficient documentation

## 2024-06-13 DIAGNOSIS — N62 Hypertrophy of breast: Secondary | ICD-10-CM | POA: Diagnosis not present

## 2024-06-13 DIAGNOSIS — I2510 Atherosclerotic heart disease of native coronary artery without angina pectoris: Secondary | ICD-10-CM | POA: Insufficient documentation

## 2024-06-13 DIAGNOSIS — D649 Anemia, unspecified: Secondary | ICD-10-CM | POA: Diagnosis not present

## 2024-06-13 DIAGNOSIS — I719 Aortic aneurysm of unspecified site, without rupture: Secondary | ICD-10-CM | POA: Diagnosis not present

## 2024-06-13 HISTORY — PX: BREAST LUMPECTOMY WITH RADIOACTIVE SEED LOCALIZATION: SHX6424

## 2024-06-13 SURGERY — BREAST LUMPECTOMY WITH RADIOACTIVE SEED LOCALIZATION
Anesthesia: General | Site: Breast | Laterality: Right

## 2024-06-13 MED ORDER — ACETAMINOPHEN 500 MG PO TABS
1000.0000 mg | ORAL_TABLET | ORAL | Status: AC
  Administered 2024-06-13: 1000 mg via ORAL

## 2024-06-13 MED ORDER — ONDANSETRON HCL 4 MG/2ML IJ SOLN
INTRAMUSCULAR | Status: AC
  Filled 2024-06-13: qty 2

## 2024-06-13 MED ORDER — 0.9 % SODIUM CHLORIDE (POUR BTL) OPTIME
TOPICAL | Status: DC | PRN
  Administered 2024-06-13: 1000 mL

## 2024-06-13 MED ORDER — LIDOCAINE 2% (20 MG/ML) 5 ML SYRINGE
INTRAMUSCULAR | Status: DC | PRN
  Administered 2024-06-13: 100 mg via INTRAVENOUS

## 2024-06-13 MED ORDER — ONDANSETRON HCL 4 MG/2ML IJ SOLN: 4.0000 mg | Freq: Once | INTRAMUSCULAR | Status: DC | PRN

## 2024-06-13 MED ORDER — ACETAMINOPHEN 500 MG PO TABS
ORAL_TABLET | ORAL | Status: AC
  Filled 2024-06-13: qty 2

## 2024-06-13 MED ORDER — EPHEDRINE SULFATE-NACL 50-0.9 MG/10ML-% IV SOSY
PREFILLED_SYRINGE | INTRAVENOUS | Status: DC | PRN
  Administered 2024-06-13: 5 mg via INTRAVENOUS
  Administered 2024-06-13: 10 mg via INTRAVENOUS

## 2024-06-13 MED ORDER — CEFAZOLIN SODIUM-DEXTROSE 2-4 GM/100ML-% IV SOLN
INTRAVENOUS | Status: AC
  Filled 2024-06-13: qty 100

## 2024-06-13 MED ORDER — DEXAMETHASONE SOD PHOSPHATE PF 10 MG/ML IJ SOLN
INTRAMUSCULAR | Status: DC | PRN
  Administered 2024-06-13: 10 mg via INTRAVENOUS

## 2024-06-13 MED ORDER — FENTANYL CITRATE (PF) 100 MCG/2ML IJ SOLN
INTRAMUSCULAR | Status: AC
  Filled 2024-06-13: qty 2

## 2024-06-13 MED ORDER — FENTANYL CITRATE (PF) 250 MCG/5ML IJ SOLN
INTRAMUSCULAR | Status: DC | PRN
  Administered 2024-06-13: 50 ug via INTRAVENOUS
  Administered 2024-06-13 (×2): 25 ug via INTRAVENOUS

## 2024-06-13 MED ORDER — LIDOCAINE 2% (20 MG/ML) 5 ML SYRINGE
INTRAMUSCULAR | Status: AC
  Filled 2024-06-13: qty 5

## 2024-06-13 MED ORDER — GABAPENTIN 100 MG PO CAPS
ORAL_CAPSULE | ORAL | Status: AC
Start: 2024-06-13 — End: 2024-06-13
  Filled 2024-06-13: qty 1

## 2024-06-13 MED ORDER — TRAMADOL HCL 50 MG PO TABS
50.0000 mg | ORAL_TABLET | Freq: Once | ORAL | Status: AC | PRN
  Administered 2024-06-13: 50 mg via ORAL

## 2024-06-13 MED ORDER — OXYCODONE HCL 5 MG PO TABS: 5.0000 mg | ORAL_TABLET | Freq: Once | ORAL | Status: DC | PRN

## 2024-06-13 MED ORDER — FENTANYL CITRATE (PF) 100 MCG/2ML IJ SOLN: 25.0000 ug | INTRAMUSCULAR | Status: DC | PRN

## 2024-06-13 MED ORDER — OXYCODONE HCL 5 MG/5ML PO SOLN: 5.0000 mg | Freq: Once | ORAL | Status: DC | PRN

## 2024-06-13 MED ORDER — LACTATED RINGERS IV SOLN: INTRAVENOUS | Status: DC

## 2024-06-13 MED ORDER — TRAMADOL HCL 50 MG PO TABS: 50.0000 mg | ORAL_TABLET | Freq: Four times a day (QID) | ORAL | 0 refills | Status: DC | PRN

## 2024-06-13 MED ORDER — CEFAZOLIN SODIUM-DEXTROSE 2-4 GM/100ML-% IV SOLN
2.0000 g | INTRAVENOUS | Status: AC
  Administered 2024-06-13: 2 g via INTRAVENOUS

## 2024-06-13 MED ORDER — PROPOFOL 10 MG/ML IV BOLUS
INTRAVENOUS | Status: AC
Start: 2024-06-13 — End: 2024-06-13
  Filled 2024-06-13: qty 20

## 2024-06-13 MED ORDER — EPHEDRINE 5 MG/ML INJ
INTRAVENOUS | Status: AC
Start: 2024-06-13 — End: 2024-06-13
  Filled 2024-06-13: qty 5

## 2024-06-13 MED ORDER — ACETAMINOPHEN 10 MG/ML IV SOLN: 1000.0000 mg | Freq: Once | INTRAVENOUS | Status: DC | PRN

## 2024-06-13 MED ORDER — PROPOFOL 10 MG/ML IV BOLUS
INTRAVENOUS | Status: DC | PRN
  Administered 2024-06-13: 130 mg via INTRAVENOUS

## 2024-06-13 MED ORDER — ONDANSETRON HCL 4 MG/2ML IJ SOLN
INTRAMUSCULAR | Status: DC | PRN
  Administered 2024-06-13: 4 mg via INTRAVENOUS

## 2024-06-13 MED ORDER — GABAPENTIN 100 MG PO CAPS
100.0000 mg | ORAL_CAPSULE | ORAL | Status: AC
  Administered 2024-06-13: 100 mg via ORAL

## 2024-06-13 MED ORDER — BUPIVACAINE-EPINEPHRINE (PF) 0.25% -1:200000 IJ SOLN
INTRAMUSCULAR | Status: DC | PRN
  Administered 2024-06-13: 5 mL via PERINEURAL

## 2024-06-13 MED ORDER — TRAMADOL HCL 50 MG PO TABS
ORAL_TABLET | ORAL | Status: AC
  Filled 2024-06-13: qty 1

## 2024-06-13 SURGICAL SUPPLY — 32 items
BLADE SURG 15 STRL LF DISP TIS (BLADE) ×2 IMPLANT
CANISTER SUC SOCK COL 7IN (MISCELLANEOUS) ×2 IMPLANT
CANISTER SUCT 1200ML W/VALVE (MISCELLANEOUS) ×2 IMPLANT
CHLORAPREP W/TINT 26 (MISCELLANEOUS) ×2 IMPLANT
CLIP APPLIE 9.375 MED OPEN (MISCELLANEOUS) IMPLANT
COVER BACK TABLE 60X90IN (DRAPES) ×2 IMPLANT
COVER MAYO STAND STRL (DRAPES) ×2 IMPLANT
COVER PROBE CYLINDRICAL 5X96 (MISCELLANEOUS) ×2 IMPLANT
DERMABOND ADVANCED .7 DNX12 (GAUZE/BANDAGES/DRESSINGS) ×2 IMPLANT
DRAPE LAPAROSCOPIC ABDOMINAL (DRAPES) ×2 IMPLANT
DRAPE UTILITY XL STRL (DRAPES) ×2 IMPLANT
ELECT COATED BLADE 2.86 ST (ELECTRODE) ×2 IMPLANT
ELECTRODE REM PT RTRN 9FT ADLT (ELECTROSURGICAL) ×2 IMPLANT
GLOVE BIO SURGEON STRL SZ7.5 (GLOVE) ×4 IMPLANT
GOWN STRL REUS W/ TWL LRG LVL3 (GOWN DISPOSABLE) ×4 IMPLANT
KIT MARKER MARGIN INK (KITS) ×2 IMPLANT
NDL HYPO 25X1 1.5 SAFETY (NEEDLE) IMPLANT
NEEDLE HYPO 25X1 1.5 SAFETY (NEEDLE) IMPLANT
NS IRRIG 1000ML POUR BTL (IV SOLUTION) IMPLANT
PACK BASIN DAY SURGERY FS (CUSTOM PROCEDURE TRAY) ×2 IMPLANT
PENCIL SMOKE EVACUATOR (MISCELLANEOUS) ×2 IMPLANT
SLEEVE SCD COMPRESS KNEE MED (STOCKING) ×2 IMPLANT
SPIKE FLUID TRANSFER (MISCELLANEOUS) IMPLANT
SPONGE T-LAP 18X18 ~~LOC~~+RFID (SPONGE) ×2 IMPLANT
SUT MON AB 4-0 PC3 18 (SUTURE) ×2 IMPLANT
SUT SILK 2 0 SH (SUTURE) IMPLANT
SUT VICRYL 3-0 CR8 SH (SUTURE) ×2 IMPLANT
SYR CONTROL 10ML LL (SYRINGE) IMPLANT
TOWEL GREEN STERILE FF (TOWEL DISPOSABLE) ×2 IMPLANT
TRAY FAXITRON CT DISP (TRAY / TRAY PROCEDURE) ×2 IMPLANT
TUBE CONNECTING 20X1/4 (TUBING) ×2 IMPLANT
YANKAUER SUCT BULB TIP NO VENT (SUCTIONS) IMPLANT

## 2024-06-13 NOTE — Transfer of Care (Signed)
 Immediate Anesthesia Transfer of Care Note  Patient: Desiree [name]  Procedure(s) Performed: BREAST LUMPECTOMY WITH RADIOACTIVE SEED LOCALIZATION (Right: Breast)  Patient Location: PACU  Anesthesia Type:General  Level of Consciousness: awake, patient cooperative, and responds to stimulation  Airway & Oxygen Therapy: Patient Spontanous Breathing  Post-op Assessment: Report given to RN and Post -op Vital signs reviewed and stable  Post vital signs: Reviewed and stable  Last Vitals:  Vitals Value Taken Time  BP 122/65 06/13/24 11:15  Temp    Pulse 60 06/13/24 11:16  Resp 12 06/13/24 11:16  SpO2 96 % 06/13/24 11:16  Vitals shown include unfiled device data.  Last Pain:  Vitals:   06/13/24 0846  TempSrc: Temporal  PainSc: 0-No pain      Patients Stated Pain Goal: 3 (06/13/24 0846)  Complications: No notable events documented.

## 2024-06-13 NOTE — Anesthesia Preprocedure Evaluation (Signed)
 Anesthesia Evaluation  Patient identified by MRN, date of birth, ID band Patient awake    Reviewed: Allergy & Precautions, NPO status , Patient's Chart, lab work & pertinent test results, reviewed documented beta blocker date and time   History of Anesthesia Complications Negative for: history of anesthetic complications  Airway Mallampati: II  TM Distance: >3 FB     Dental no notable dental hx.    Pulmonary neg COPD, neg PE   breath sounds clear to auscultation       Cardiovascular hypertension, Pt. on medications (-) angina + CAD  (-) Past MI, (-) Cardiac Stents and (-) CABG + Valvular Problems/Murmurs  Rhythm:Regular Rate:Normal  TAAA under surveillance   Study Conclusions   - Left ventricle: Turbulent flow through LVOT No obstruction. The    cavity size was normal. Wall thickness was increased in a pattern    of mild LVH. Systolic function was vigorous. The estimated    ejection fraction was in the range of 65% to 70%.  - Aortic valve: Valve area (VTI): 2.02 cm^2. Valve area (Vmax):    2.08 cm^2. Valve area (Vmean): 2.17 cm^2.     Neuro/Psych neg Seizures CVA, No Residual Symptoms    GI/Hepatic ,GERD  ,,(+) neg Cirrhosis        Endo/Other  neg diabetes    Renal/GU CRFRenal disease     Musculoskeletal  (+) Arthritis , Osteoarthritis,    Abdominal   Peds  Hematology  (+) Blood dyscrasia, anemia   Anesthesia Other Findings   Reproductive/Obstetrics                              Anesthesia Physical Anesthesia Plan  ASA: 2  Anesthesia Plan: General   Post-op Pain Management:    Induction: Intravenous  PONV Risk Score and Plan: 2 and Ondansetron  and Dexamethasone   Airway Management Planned: LMA  Additional Equipment:   Intra-op Plan:   Post-operative Plan: Extubation in OR  Informed Consent: I have reviewed the patients History and Physical, chart, labs and  discussed the procedure including the risks, benefits and alternatives for the proposed anesthesia with the patient or authorized representative who has indicated his/her understanding and acceptance.     Dental advisory given  Plan Discussed with: CRNA  Anesthesia Plan Comments:          Anesthesia Quick Evaluation

## 2024-06-13 NOTE — Op Note (Signed)
 06/13/2024  11:03 AM  PATIENT:  Desiree [name]  78 y.o. female  PRE-OPERATIVE DIAGNOSIS:  RIGHT BREAST CANCER  POST-OPERATIVE DIAGNOSIS:  Right Breast Cancer  PROCEDURE:  Procedure(s): BREAST LUMPECTOMY WITH RADIOACTIVE SEED LOCALIZATION (Right)  SURGEON:  Surgeons and Role:    * [name], MD - Primary  PHYSICIAN ASSISTANT:   ASSISTANTS: none   ANESTHESIA:   local and general  EBL:  minimal   BLOOD ADMINISTERED:none  DRAINS: none   LOCAL MEDICATIONS USED:  MARCAINE      SPECIMEN:  Source of Specimen:  right breast tissue with additional medial and superior margins  DISPOSITION OF SPECIMEN:  PATHOLOGY  COUNTS:  YES  TOURNIQUET:  * No tourniquets in log *  DICTATION: .Dragon Dictation  After informed consent was obtained the patient was brought to the operating room and placed in the supine position on the operating table.  After adequate induction of general anesthesia the patient's right breast was prepped with ChloraPrep, allowed to dry, and draped in usual sterile manner.  An appropriate timeout was performed.  Previously an I-125 seed was placed in the lower inner central right breast to mark an area of invasive breast cancer.  The neoprobe was set to I-125 in the area of radioactivity was readily identified.  The area around this was infiltrated with quarter percent Marcaine .  A curvilinear incision was made along the lower inner edge of the areola of the right breast with a 15 blade knife.  The incision was carried through the skin and subcutaneous tissue sharply with the electrocautery.  Dissection was then carried towards the radioactive seed under the direction of the neoprobe.  Once I more closely approached the radioactive seed I then removed a circular portion of breast tissue sharply with the electrocautery around the radioactive seed while checking the area of radioactivity frequently.  Once the tissue was removed it was oriented with the appropriate paint  colors.  A specimen radiograph was obtained that showed the clip and seed to be within the specimen.  I elected to take an additional medial and superior margin and these were marked appropriately.  All of the tissue was then sent to pathology for further evaluation.  Hemostasis was achieved using the Bovie electrocautery.  The wound was irrigated with saline and infiltrated with more quarter percent Marcaine .  The deep layer of the wound was then closed with layers of interrupted 3-0 Vicryl stitches.  The skin was closed with interrupted 4-0 Monocryl subcuticular stitches.  Dermabond dressings were applied.  The patient tolerated the procedure well.  At the end of the case all needle sponge and instrument counts were correct.  Patient was then awakened and taken to recovery in stable condition.  PLAN OF CARE: Discharge to home after PACU  PATIENT DISPOSITION:  PACU - hemodynamically stable.   Delay start of Pharmacological VTE agent (>24hrs) due to surgical blood loss or risk of bleeding: not applicable

## 2024-06-13 NOTE — Interval H&P Note (Signed)
 History and Physical Interval Note:  06/13/2024 9:51 AM  Desiree [name] Finder  has presented today for surgery, with the diagnosis of RIGHT BREAST CANCER.  The various methods of treatment have been discussed with the patient and family. After consideration of risks, benefits and other options for treatment, the patient has consented to  Procedure(s): BREAST LUMPECTOMY WITH RADIOACTIVE SEED LOCALIZATION (Right) as a surgical intervention.  The patient's history has been reviewed, patient examined, no change in status, stable for surgery.  I have reviewed the patient's chart and labs.  Questions were answered to the patient's satisfaction.     [name]

## 2024-06-13 NOTE — Discharge Instructions (Signed)
 No tylenol  until 2:48 p.m.   Post Anesthesia Home Care Instructions  Activity: Get plenty of rest for the remainder of the day. A responsible individual must stay with you for 24 hours following the procedure.  For the next 24 hours, DO NOT: -Drive a car -Advertising copywriter -Drink alcoholic beverages -Take any medication unless instructed by your physician -Make any legal decisions or sign important papers.  Meals: Start with liquid foods such as gelatin or soup. Progress to regular foods as tolerated. Avoid greasy, spicy, heavy foods. If nausea and/or vomiting occur, drink only clear liquids until the nausea and/or vomiting subsides. Call your physician if vomiting continues.  Special Instructions/Symptoms: Your throat may feel dry or sore from the anesthesia or the breathing tube placed in your throat during surgery. If this causes discomfort, gargle with warm salt water . The discomfort should disappear within 24 hours.  If you had a scopolamine patch placed behind your ear for the management of post- operative nausea and/or vomiting:  1. The medication in the patch is effective for 72 hours, after which it should be removed.  Wrap patch in a tissue and discard in the trash. Wash hands thoroughly with soap and water . 2. You may remove the patch earlier than 72 hours if you experience unpleasant side effects which may include dry mouth, dizziness or visual disturbances. 3. Avoid touching the patch. Wash your hands with soap and water  after contact with the patch.

## 2024-06-13 NOTE — Anesthesia Procedure Notes (Signed)
 Procedure Name: LMA Insertion Date/Time: 06/13/2024 10:21 AM  Performed by: Leopoldo Wanda DASEN, CRNAPre-anesthesia Checklist: Patient identified, Emergency Drugs available, Suction available and Patient being monitored Patient Re-evaluated:Patient Re-evaluated prior to induction Oxygen Delivery Method: Circle System Utilized Preoxygenation: Pre-oxygenation with 100% oxygen Induction Type: IV induction Ventilation: Mask ventilation without difficulty LMA: LMA inserted LMA Size: 4.0 Number of attempts: 1 Airway Equipment and Method: Bite block Placement Confirmation: positive ETCO2 Tube secured with: Tape Dental Injury: Teeth and Oropharynx as per pre-operative assessment

## 2024-06-13 NOTE — H&P (Signed)
 REFERRING PHYSICIAN: [name], MD PROVIDER: DEWARD GARNETTE NULL, MD MRN: [id number] DOB: 04-22-1946 Subjective   Chief Complaint: New Consultation (New Breast Cancer)  History of Present Illness: Desiree [name] is a 78 y.o. female who is seen today as an office consultation for evaluation of New Consultation (New Breast Cancer)  We are asked to see the patient in consultation by Dr. Rollene Antonetta to evaluate her for a new right breast cancer. The patient is a 78 year old black female who recently went for a routine screening mammogram. At that time she was found to have a 7 mm mass in the lower inner quadrant of the right breast. The lymph nodes looked normal. 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 She states she was treated with radiation but no further therapy. She does have a stable aortic aneurysm that is followed by cardiology. She does not smoke.  Review of Systems: A complete review of systems was obtained from the patient. I have reviewed this information and discussed as appropriate with the patient. See HPI as well for other ROS.  ROS   Medical History: Past Medical History:  Diagnosis Date  Aneurysm ()  Arthritis  DDD (degenerative disc disease), cervical  History of cancer  History of stroke  Hypertension   There is no problem list on file for this patient.  Past Surgical History:  Procedure Laterality Date  ESOPHAGOGASTRODUODENOSCOPY, FLEXIBLE, TRANSORAL FOR BARIATRIC BALLOON ADJUSTMENT 02/10/2014  ARTHROPLASTY TOTAL KNEE Right 12/21/2019  Breast bioopsy Right 04/14/2024  COLONOSCOPY 04/19/2024  Cardiac catheterization  ENDOSCOPIC CARPAL TUNNEL RELEASE  HYSTERECTOMY    Allergies  Allergen Reactions  Codeine Other (See Comments)  CONFUSION/ DIZZY  CONFUSION/  DIZZY Other reaction(s): Other (see comments) CONFUSION/ DIZZY  Quinolones Other (See Comments)  Patient was warned about not using Cipro  and similar antibiotics. Recent studies have raised concern that fluoroquinolone antibiotics could be associated with an increased risk of aortic aneurysm Fluoroquinolones have non-antimicrobial properties that might jeopardise the integrity of the extracellular matrix of the vascular wall I.  Patient was warned about not using Cipro  and similar antibiotics. Recent studies have raised concern that fluoroquinolone antibiotics could be associated with an increased risk of aortic aneurysm Fluoroquinolones have non-antimicrobial properties that might jeopardise the integrity of the extracellular matrix of the vascular wall I. Other reaction(s): Other (see comments) Patient was warned about not using Cipro  and similar antibiotics. Recent studies have raised concern that fluoroquinolone antibiotics could be associated with an increased risk of aortic aneurysm Fluoroquinolones have non-antimicrobial properties that might jeopardise the integrity of the extracellular matrix of the vascular wall I.   Current Outpatient Medications on File Prior to Visit  Medication Sig Dispense Refill  carvediloL  (COREG ) 3.125 MG tablet Take 3.125 mg by mouth 2 (two) times daily with meals  chlorthalidone  50 MG tablet Take 50 mg by mouth once daily  cholecalciferol (VITAMIN D3) 2,000 unit tablet Take 2,000 Units by mouth once daily  loratadine  (CLARITIN ) 10 mg tablet Take one tablet by mouth one to two times daily as needed for uncontrolled allergies  losartan  (COZAAR ) 50 MG tablet Take 50 mg by mouth once daily  lovastatin  (MEVACOR ) 40 MG tablet Take 40 mg by mouth once daily  montelukast  (SINGULAIR ) 10 mg tablet Take 10 mg by  mouth at bedtime  multivitamin tablet Take 1 tablet by mouth once daily  terBINafine  HCL (LAMISIL  AT) 1 % cream Apply twice daily to rash on scalp for 1  week, then , as needed (Patient not taking: Reported on 04/26/2024)  tiZANidine  (ZANAFLEX ) 2 MG tablet Take 2 mg by mouth every 8 (eight) hours as needed (Patient not taking: Reported on 04/26/2024)  zinc  gluconate 50 mg tablet Take 50 mg by mouth once daily (Patient not taking: Reported on 04/26/2024)   No current facility-administered medications on file prior to visit.   Family History  Problem Relation Age of Onset  High blood pressure (Hypertension) Mother  Coronary Artery Disease (Blocked arteries around heart) Mother  Kidney failure Mother  Myocardial Infarction (Heart attack) Mother  Throat cancer Father  Lung cancer Father  Prostate cancer Father  Breast cancer Sister  High blood pressure (Hypertension) Brother  Colon polyps Neg Hx  Colon cancer Neg Hx    Social History   Tobacco Use  Smoking Status Never  Smokeless Tobacco Never    Social History   Socioeconomic History  Marital status: Divorced  Tobacco Use  Smoking status: Never  Smokeless tobacco: Never  Vaping Use  Vaping status: Never Used  Substance and Sexual Activity  Alcohol  use: Never  Drug use: Never   Social Drivers of Corporate investment banker Strain: Low Risk (07/05/2021)  Received from [facility]  Overall Financial Resource Strain (CARDIA)  Difficulty of Paying Living Expenses: Not hard at all  Food Insecurity: No Food Insecurity (07/05/2021)  Received from [facility]  Hunger Vital Sign  Within the past 12 months, you worried that your food would run out before you got the money to buy more.: Never true  Within the past 12 months, the food you bought just didn't last and you didn't have money to get more.: Never true  Transportation Needs: No Transportation Needs (07/05/2021)  Received from [facility] - Transportation  Lack of Transportation (Medical): No  Lack of Transportation (Non-Medical): No  Physical Activity: Insufficiently Active (07/05/2021)  Received from [facility]   Exercise Vital Sign  On average, how many days per week do you engage in moderate to strenuous exercise (like a brisk walk)?: 3 days  On average, how many minutes do you engage in exercise at this level?: 30 min  Stress: No Stress Concern Present (06/19/2020)  Received from [facility] - Occupational Stress Questionnaire  Feeling of Stress : Not at all  Social Connections: Moderately Integrated (07/05/2021)  Received from [facility]  Social Connection and Isolation Panel  In a typical week, how many times do you talk on the phone with family, friends, or neighbors?: More than three times a week  How often do you get together with friends or relatives?: More than three times a week  How often do you attend church or religious services?: More than 4 times per year  Do you belong to any clubs or organizations such as church groups, unions, fraternal or athletic groups, or school groups?: Yes  How often do you attend meetings of the clubs or organizations you belong to?: More than 4 times per year  Are you married, widowed, divorced, separated, never married, or living with a partner?: Divorced   Objective:   Vitals:  BP: (!) 150/80  Temp: 36.6 C (97.8 F)  Weight: 87.5 kg (192 lb 12.8 oz)  Height: 156.2 cm (5' 1.5)  PainSc: 0-No  pain   Body mass index is 35.84 kg/m.  Physical Exam Vitals reviewed.  Constitutional:  General: She is not in acute distress. Appearance: Normal appearance.  HENT:  Head: Normocephalic and atraumatic.  Right Ear: External ear normal.  Left Ear: External ear normal.  Nose: Nose normal.  Mouth/Throat:  Mouth: Mucous membranes are moist.  Pharynx: Oropharynx is clear.  Eyes:  General: No scleral icterus. Extraocular Movements: Extraocular movements intact.  Conjunctiva/sclera: Conjunctivae normal.  Pupils: Pupils are equal, round, and reactive to light.  Cardiovascular:  Rate and Rhythm: Normal rate and  regular rhythm.  Pulses: Normal pulses.  Heart sounds: Murmur heard.  Pulmonary:  Effort: Pulmonary effort is normal. No respiratory distress.  Breath sounds: Normal breath sounds.  Abdominal:  General: Bowel sounds are normal.  Palpations: Abdomen is soft.  Tenderness: There is no abdominal tenderness.  Musculoskeletal:  General: No swelling, tenderness or deformity. Normal range of motion.  Cervical back: Normal range of motion and neck supple.  Skin: General: Skin is warm and dry.  Coloration: Skin is not jaundiced.  Neurological:  General: No focal deficit present.  Mental Status: She is alert and oriented to person, place, and time.  Psychiatric:  Mood and Affect: Mood normal.  Behavior: Behavior normal.     Breast: There is a well-healed scar in the upper portion of the right breast. There is no palpable mass in either breast. There is no palpable axillary, supraclavicular, or cervical lymphadenopathy.  Labs, Imaging and Diagnostic Testing:  Assessment and Plan:   Diagnoses and all orders for this visit:  Malignant neoplasm of lower-inner quadrant of right breast of female, estrogen receptor positive (CMS/HHS-HCC) - [facility] Case Posting Request; Future   The patient appears to have a small early-stage 7 mm cancer in the lower inner quadrant of the right breast with clinically negative nodes and all favorable markers. I have discussed with her in detail the different options for treatment and at this point she favors breast conservation which I feel is very reasonable even with her history of previous radiation. She would likely do well with a lumpectomy and antiestrogens. I will refer her to the medical and radiation oncologist to discuss adjuvant therapy. We will plan for a right breast radioactive seed localized lumpectomy. I have discussed with her in detail the risks and benefits of the operation as well as some of the technical aspects including the use of a radioactive  seed for localization and she understands and wishes to proceed. We will get cardiac clearance and then move forward with surgical scheduling

## 2024-06-14 ENCOUNTER — Encounter (HOSPITAL_BASED_OUTPATIENT_CLINIC_OR_DEPARTMENT_OTHER): Payer: Self-pay | Admitting: General Surgery

## 2024-06-14 NOTE — Anesthesia Postprocedure Evaluation (Signed)
 Anesthesia Post Note  Patient: Desiree [name]  Procedure(s) Performed: BREAST LUMPECTOMY WITH RADIOACTIVE SEED LOCALIZATION (Right: Breast)     Patient location during evaluation: PACU Anesthesia Type: General Level of consciousness: awake and alert Pain management: pain level controlled Vital Signs Assessment: post-procedure vital signs reviewed and stable Respiratory status: spontaneous breathing, nonlabored ventilation, respiratory function stable and patient connected to nasal cannula oxygen Cardiovascular status: blood pressure returned to baseline and stable Postop Assessment: no apparent nausea or vomiting Anesthetic complications: no   No notable events documented.  Last Vitals:  Vitals:   06/13/24 1153 06/13/24 1242  BP:  (!) 143/73  Pulse: (!) 52 (!) 54  Resp: 14 16  Temp:  (!) 36.4 C  SpO2: 97% 98%    Last Pain:  Vitals:   06/14/24 0917  TempSrc:   PainSc: 2                  Lynwood MARLA Cornea

## 2024-06-15 LAB — SURGICAL PATHOLOGY

## 2024-06-16 ENCOUNTER — Ambulatory Visit: Payer: Self-pay | Admitting: General Surgery

## 2024-06-20 ENCOUNTER — Encounter: Payer: Self-pay | Admitting: *Deleted

## 2024-06-28 ENCOUNTER — Inpatient Hospital Stay: Attending: Hematology and Oncology | Admitting: Hematology and Oncology

## 2024-06-28 ENCOUNTER — Encounter: Payer: Self-pay | Admitting: *Deleted

## 2024-06-28 VITALS — BP 126/67 | HR 70 | Temp 97.5°F | Resp 16 | Wt 193.5 lb

## 2024-06-28 DIAGNOSIS — M8580 Other specified disorders of bone density and structure, unspecified site: Secondary | ICD-10-CM | POA: Insufficient documentation

## 2024-06-28 DIAGNOSIS — Z79811 Long term (current) use of aromatase inhibitors: Secondary | ICD-10-CM | POA: Diagnosis not present

## 2024-06-28 DIAGNOSIS — Z808 Family history of malignant neoplasm of other organs or systems: Secondary | ICD-10-CM | POA: Diagnosis not present

## 2024-06-28 DIAGNOSIS — Z803 Family history of malignant neoplasm of breast: Secondary | ICD-10-CM | POA: Insufficient documentation

## 2024-06-28 DIAGNOSIS — Z1721 Progesterone receptor positive status: Secondary | ICD-10-CM | POA: Insufficient documentation

## 2024-06-28 DIAGNOSIS — Z7982 Long term (current) use of aspirin: Secondary | ICD-10-CM | POA: Diagnosis not present

## 2024-06-28 DIAGNOSIS — Z90710 Acquired absence of both cervix and uterus: Secondary | ICD-10-CM | POA: Diagnosis not present

## 2024-06-28 DIAGNOSIS — Z923 Personal history of irradiation: Secondary | ICD-10-CM | POA: Insufficient documentation

## 2024-06-28 DIAGNOSIS — Z90722 Acquired absence of ovaries, bilateral: Secondary | ICD-10-CM | POA: Diagnosis not present

## 2024-06-28 DIAGNOSIS — Z79899 Other long term (current) drug therapy: Secondary | ICD-10-CM | POA: Diagnosis not present

## 2024-06-28 DIAGNOSIS — Z801 Family history of malignant neoplasm of trachea, bronchus and lung: Secondary | ICD-10-CM | POA: Insufficient documentation

## 2024-06-28 DIAGNOSIS — Z8042 Family history of malignant neoplasm of prostate: Secondary | ICD-10-CM | POA: Insufficient documentation

## 2024-06-28 DIAGNOSIS — Z8419 Family history of other disorders of kidney and ureter: Secondary | ICD-10-CM | POA: Insufficient documentation

## 2024-06-28 DIAGNOSIS — Z8249 Family history of ischemic heart disease and other diseases of the circulatory system: Secondary | ICD-10-CM | POA: Insufficient documentation

## 2024-06-28 DIAGNOSIS — K5730 Diverticulosis of large intestine without perforation or abscess without bleeding: Secondary | ICD-10-CM | POA: Diagnosis not present

## 2024-06-28 DIAGNOSIS — C50311 Malignant neoplasm of lower-inner quadrant of right female breast: Secondary | ICD-10-CM | POA: Insufficient documentation

## 2024-06-28 DIAGNOSIS — Z8673 Personal history of transient ischemic attack (TIA), and cerebral infarction without residual deficits: Secondary | ICD-10-CM | POA: Diagnosis not present

## 2024-06-28 DIAGNOSIS — Z885 Allergy status to narcotic agent status: Secondary | ICD-10-CM | POA: Insufficient documentation

## 2024-06-28 DIAGNOSIS — N6489 Other specified disorders of breast: Secondary | ICD-10-CM | POA: Diagnosis not present

## 2024-06-28 DIAGNOSIS — Z8719 Personal history of other diseases of the digestive system: Secondary | ICD-10-CM | POA: Diagnosis not present

## 2024-06-28 DIAGNOSIS — Z1732 Human epidermal growth factor receptor 2 negative status: Secondary | ICD-10-CM | POA: Insufficient documentation

## 2024-06-28 DIAGNOSIS — Z170 Estrogen receptor positive status [ER+]: Secondary | ICD-10-CM | POA: Diagnosis not present

## 2024-06-28 DIAGNOSIS — M1610 Unilateral primary osteoarthritis, unspecified hip: Secondary | ICD-10-CM | POA: Insufficient documentation

## 2024-06-28 DIAGNOSIS — I10 Essential (primary) hypertension: Secondary | ICD-10-CM | POA: Insufficient documentation

## 2024-06-28 DIAGNOSIS — K2950 Unspecified chronic gastritis without bleeding: Secondary | ICD-10-CM | POA: Insufficient documentation

## 2024-06-28 DIAGNOSIS — E785 Hyperlipidemia, unspecified: Secondary | ICD-10-CM | POA: Insufficient documentation

## 2024-06-28 MED ORDER — ANASTROZOLE 1 MG PO TABS: 1.0000 mg | ORAL_TABLET | Freq: Every day | ORAL | 3 refills | Status: AC

## 2024-06-28 NOTE — Progress Notes (Signed)
 [facility] CONSULT NOTE  Patient Care Team: [name], MD as PCP - General (Family Medicine) Croitoru, Jerel, MD as PCP - Cardiology (Cardiology) [name], MD (Inactive) as Consulting Physician (Cardiothoracic Surgery) Croitoru, Jerel, MD as Consulting Physician (Cardiology) [name], MD as Consulting Physician (Ophthalmology) [name], MD as Consulting Physician (Urology) [name], DO as Consulting Physician (Gastroenterology) [name], [name], RN as Oncology Nurse Navigator [name], RN as Oncology Nurse Navigator [name], MD as Consulting Physician (Hematology and Oncology) [name], MD as Consulting Physician (General Surgery) [name], MD as Oncology Nurse Navigator (Radiation Oncology)  CHIEF COMPLAINTS/PURPOSE OF CONSULTATION:  New diagnosis of breast cancer.  ASSESSMENT & PLAN:  No problem-specific Assessment & Plan notes found for this encounter.  No orders of the defined types were placed in this encounter.  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 Radiation therapy not pursued due to prior radiation and age. Hormonal therapy with aromatase inhibitors preferred over tamoxifen. - Prescribe anastrozole, one pill daily for five years, with 90-day refills. - Send prescription to [facility]. - Monitor bone density due to potential loss from aromatase inhibitors. - Educated on anastrozole side effects: hot flashes, vaginal dryness, expected to be minimal. - Advised on weight-bearing exercises, vitamin D , and calcium  for bone health. - Schedule follow-up in three months to assess medication tolerance and side effects.  Osteopenia Osteopenia noted on 2021 bone density scan. No osteoporosis. Concern for bone density loss from aromatase  inhibitors. - Order new bone density scan for current baseline. - Encourage weight-bearing exercises to improve bone density. - Advise continuation of vitamin D  supplementation.   HISTORY OF PRESENTING ILLNESS:  Desiree [name] 78 y.o. female is here because of right sided breast cancer  Oncology History  Malignant neoplasm of female breast (HCC)  02/04/2008 Initial Diagnosis   Malignant neoplasm of female breast (HCC)    Mammogram   Further evaluation is suggested for possible asymmetry in the right breast. Targeted sonographic evaluation of the RIGHT breast demonstrates an oval mass with indistinct and microlobulated margins with posterior acoustic shadowing, measuring 0.7 x 0.5 x 0.5 cm at 5 o'clock 5 CMFN. This corresponds to the abnormality seen on mammogram.    Breast US    Suspicious 0.7 cm RIGHT breast mass (5 o'clock 5 CMFN) corresponding to the abnormality seen on mammogram.    Pathology Results   BREAST MASS, RIGHT 5:00 5 CMFN, NEEDLE CORE BIOPSY:  - Invasive ductal carcinoma, see comment  - Tubule formation: Score 3  - Nuclear pleomorphism: Score 3  - Mitotic count: Score 1  - Total score: 7  - Overall Grade: 2  - Lymphovascular invasion: Not identified  - Cancer Length: 0.6 cm  - Calcifications: Present  - Other findings: None   The tumor cells are NEGATIVE for Her2 (O).   Estrogen Receptor:        70%, POSITIVE, MODERATE STAINING INTENSITY  Progesterone Receptor:    90%, POSITIVE, STRONG STAINING INTENSITY  Proliferation Marker Ki-67:      5%      Malignant neoplasm of lower-inner quadrant of right breast of female, estrogen receptor positive (HCC)  04/29/2024 Initial Diagnosis   Malignant neoplasm of lower-inner quadrant of right breast of female, estrogen receptor positive (HCC)   05/02/2024 Cancer Staging   Staging form: Breast, AJCC 8th Edition -  Clinical stage from 05/02/2024: Stage IA (cT1b, cN0, cM0, G2, ER+, PR+, HER2-) - Signed by Wyatt Leeroy HERO, PA-C on  05/02/2024 Stage prefix: Initial diagnosis Histologic grading system: 3 grade system    She is here with her daughter. 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 She didn't take anti estrogen therapy back then.  Discussed the use of AI scribe software for clinical note transcription with the patient, who gave verbal consent to proceed.  History of Present Illness Desiree [name] is a 78 year old female with a recent diagnosis of breast cancer who presents for follow-up.  She has a history of breast cancer diagnosed in 2006 in the right breast, for which she underwent a lumpectomy and radiation therapy. Recently, a screening mammogram revealed an asymmetry in the right breast, leading to a diagnostic mammogram that identified a 7 mm area of concern at the five o'clock position, approximately 5 cm from the nipple. A biopsy confirmed ductal carcinoma that is estrogen and progesterone receptor positive and HER2 negative.  Interval history  Desiree [name] is a 78 year old female with breast cancer who presents for follow-up after lumpectomy.  She underwent a lumpectomy for breast cancer, where a 1.2 cm tumor was removed. The margins were free, The tumor was larger than initially thought, as imaging had suggested a size of 7 mm. She has not yet had her post-operative appointment with the surgeon, which is scheduled for July 07, 2024.  She has a history of prior radiation therapy to the same breast.  She is considering anti estrogen therapy.  Her last bone density scan in 2021 showed osteopenia, but not osteoporosis. She takes vitamin D  and engages in some walking for exercise.  No significant pain post-surgery and no severe hot flashes or other menopausal symptoms recently.    REVIEW OF SYSTEMS:   Constitutional: Denies fevers, chills or abnormal night sweats Eyes: Denies blurriness of vision, double vision or watery eyes Ears, nose, mouth, throat, and  face: Denies mucositis or sore throat Respiratory: Denies cough, dyspnea or wheezes Cardiovascular: Denies palpitation, chest discomfort or lower extremity swelling Gastrointestinal:  Denies nausea, heartburn or change in bowel habits Skin: Denies abnormal skin rashes Lymphatics: Denies new lymphadenopathy or easy bruising Neurological:Denies numbness, tingling or new weaknesses Behavioral/Psych: Mood is stable, no new changes  All other systems were reviewed with the patient and are negative.  MEDICAL HISTORY:  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--  S/P  RIGHT MASTECTMOY AND RADIATION--  NO RECURRANCE   History of CVA (cerebrovascular accident) PER SCAN IN 2011   Hyperlipidemia    Hypertension    Impaired fasting glucose PER PCP  DR ANTONETTA   WATCH DIET   Mixed urge and stress incontinence    Nocturia    Sinus drainage    Thoracic ascending aortic aneurysm LAST CHEST CT 09-02-2010--  FOLLOWED BY  CARDIOLOGIST--  DR [id number]   Vaginal wall prolapse     SURGICAL HISTORY: Past Surgical History:  Procedure Laterality Date   ABDOMINAL HYSTERECTOMY  BILATERAL CARPAL TUNNEL RELEASE  1980'S   BREAST BIOPSY Right 04/14/2024   US  RT BREAST BX W LOC DEV 1ST LESION IMG BX SPEC US  GUIDE 04/14/2024 [facility]   BREAST BIOPSY  06/10/2024   MM RT RADIOACTIVE SEED LOC MAMMO GUIDE 06/10/2024 [facility] MAMMOGRAPHY   BREAST LUMPECTOMY WITH RADIOACTIVE SEED LOCALIZATION Right 06/13/2024   Procedure: BREAST LUMPECTOMY WITH RADIOACTIVE SEED LOCALIZATION;  Surgeon: [name], MD;  Location: [facility];   Service: General;  Laterality: Right;   CARDIAC CATHETERIZATION  02-01-2009  DR EICHHORN   NORMAL CORONARY ARTERIES/ NORMAL LV SIZE AND FUNCTION/ AORTIC ROOT SIZE AT THE UPPER LIMIT OF NORMAL    CARPAL TUNNEL RELEASE     CERVICAL DISC SURGERY  12-12-2005  DR UNICE   LEFT  C6 - C7 HERINATED / DDD/ SPONDYLOSIS   COLONOSCOPY N/A 02/10/2014   Surgeon: Margo LITTIE Haddock, MD; Normal TI, mild diverticulosis in sigmoid and transverse colon, small external hemorrhoids, no obvious source for anemia.  Repeat colonoscopy in 10-15 years if benefits outweigh risk.   COLONOSCOPY N/A 04/19/2024   Procedure: COLONOSCOPY;  Surgeon: [name], DO;  Location[facility];  Service: Endoscopy;  Laterality: N/A;  9:00 AM,  OK RM 1-2   CYSTOCELE REPAIR  08/02/2012   Procedure: ANTERIOR REPAIR (CYSTOCELE);  Surgeon: Arlena LILLETTE Gal, MD;  Location: [facility];  Service: Urology;  Laterality: N/A;  [employer] Uphold Anterior Pelvic Floor Sacrospinus Repair. Anterior wall of the vagina.   ESOPHAGOGASTRODUODENOSCOPY N/A 02/10/2014   Surgeon: Margo LITTIE Haddock, MD;   Mild gastritis/duodenitis s/p biopsied, no obvious source for anemia.  Pathology with mild chronic gastritis with intestinal metaplasia, negative for H. pylori, duodenal biopsy benign.   GIVENS CAPSULE STUDY N/A 03/13/2014   Surgeon: Margo LITTIE Haddock, MD;  Normal exam.  Suspected anemia most likely due to chronic disease.   JOINT REPLACEMENT Right    [employer] 06/16/2020- knee   NECK SURGERY  2007   RIGHT PARTIAL MASTECTOMY  11-11-2004  DR PETER YOUNG   DUCTAL CARCINOMA IN SITU RIGHT BREAST   TOTAL ABDOMINAL HYSTERECTOMY W/ BILATERAL SALPINGOOPHORECTOMY  1990   TOTAL KNEE ARTHROPLASTY Right 12/21/2019   Procedure: RIGHT  TOTAL KNEE ARTHROPLASTY;  Surgeon: Barbarann Oneil BROCKS, MD;  Location: [facility];  Service: Orthopedics;  Laterality: Right;   TRANSTHORACIC ECHOCARDIOGRAM  03-30-2008  DR MARGARET SIMPSON   LV SIZE AND FUNCTION NORMAL/  MODERATE AORTIC ARCH DILATATION/ MILD MR    SOCIAL HISTORY: Social History   Socioeconomic History   Marital status: Divorced    Spouse name: Not on file   Number of children: 4   Years of education: 12+   Highest education level: Some college, no degree  Occupational History   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  Tobacco Use   Smoking status: Never   Smokeless tobacco: Never  Vaping Use   Vaping status: Never Used  Substance and Sexual Activity   Alcohol  use: No   Drug use: No   Sexual activity: Never  Other Topics Concern   Not on file  Social History Narrative   RETIRED IN June 2020.    Social Drivers of Health   Financial Resource Strain: Low Risk  (05/06/2024)   Overall Financial Resource Strain (CARDIA)    Difficulty of Paying Living Expenses: Not hard at all  Food Insecurity: No Food Insecurity (05/06/2024)   Hunger Vital Sign  Worried About Programme Researcher, Broadcasting/film/video in the Last Year: Never true    Ran Out of Food in the Last Year: Never true  Transportation Needs: No Transportation Needs (05/02/2024)   PRAPARE - Administrator, Civil Service (Medical): No    Lack of Transportation (Non-Medical): No  Physical Activity: Insufficiently Active (07/05/2021)   Exercise Vital Sign    Days of Exercise per Week: 3 days    Minutes of Exercise per Session: 30 min  Stress: No Stress Concern Present (06/19/2020)   [employer] of Occupational Health - Occupational Stress Questionnaire    Feeling of Stress : Not at all  Social Connections: Moderately Integrated (05/06/2024)   Social Connection and Isolation Panel    Frequency of Communication with Friends and Family: More than three times a week    Frequency of Social Gatherings with Friends and Family: More than three times a week    Attends Religious Services: More than 4 times per year    Active Member of [employer] or Organizations: Yes    Attends Museum/gallery Exhibitions Officer: More than 4 times per year    Marital Status: Divorced  Intimate Partner Violence: Not At Risk (05/06/2024)   Humiliation, Afraid, Rape, and Kick questionnaire    Fear of Current or Ex-Partner: No    Emotionally Abused: No    Physically Abused: No    Sexually Abused: No    FAMILY HISTORY: Family History  Problem Relation Age of Onset   Kidney failure Mother    Hypertension Mother    Heart attack Mother 41   Throat cancer Father    Prostate cancer Father    Lung cancer Father 51   Cancer Sister        breast   Breast cancer Sister    Hypertension Brother        2 stents   Colon cancer Neg Hx    Colon polyps Neg Hx     ALLERGIES:  is allergic to codeine and quinolones.  MEDICATIONS:  Current Outpatient Medications  Medication Sig Dispense Refill   acetaminophen  (TYLENOL ) 500 MG tablet Take one to  two tablets by mouth at night for pain 50 tablet 0   aspirin  EC 325 MG tablet Take 1 tablet (325 mg total) by mouth daily. 100 tablet 3   carvedilol  (COREG ) 3.125 MG tablet TAKE 1 TABLET(3.125 MG) BY MOUTH TWICE DAILY WITH A MEAL 180 tablet 1   chlorthalidone  (HYGROTON ) 50 MG tablet Take 1 tablet (50 mg total) by mouth daily. 90 tablet 3   Cholecalciferol (VITAMIN D ) 50 MCG (2000 UT) tablet Take 2,000 Units by mouth daily.      Ferrous Sulfate  (SLOW FE PO) Take 1 capsule by mouth every other day.     guaifenesin  (ROBITUSSIN) 100 MG/5ML syrup Take 200 mg by mouth 3 (three) times daily as needed for cough.     loratadine  (CLARITIN ) 10 MG tablet Take one tablet by mouth  one to two times daily as needed for uncontrolled allergies 90 tablet 2   losartan  (COZAAR ) 50 MG tablet Take 1 tablet (50 mg total) by mouth daily. 90 tablet 3   lovastatin  (MEVACOR ) 40 MG tablet Take 1 tablet by mouth once daily 90 tablet 0   MAGNESIUM PO Take by mouth.     montelukast  (SINGULAIR ) 10 MG tablet Take 10 mg by mouth at bedtime.     Multiple Vitamin (MULTIVITAMIN) tablet Take 1  tablet by mouth daily. One a  day woman's (Patient not taking: Reported on 05/06/2024)     terbinafine  (LAMISIL ) 1 % cream Apply twice daily to rash on scalp for 1 week, then , as needed 42 g 0   tiZANidine  (ZANAFLEX ) 2 MG tablet Take 1 tablet (2 mg total) by mouth every 8 (eight) hours as needed for muscle spasms. 30 tablet 0   traMADol  (ULTRAM ) 50 MG tablet Take 1 tablet (50 mg total) by mouth every 6 (six) hours as needed. 10 tablet 0   zinc  gluconate 50 MG tablet Take 50 mg by mouth daily.     No current facility-administered medications for this visit.     PHYSICAL EXAMINATION: ECOG PERFORMANCE STATUS: 0 - Asymptomatic  Vitals:   06/28/24 1046  BP: 126/67  Pulse: 70  Resp: 16  Temp: (!) 97.5 F (36.4 C)  SpO2: 100%   Filed Weights   06/28/24 1046  Weight: 193 lb 8 oz (87.8 kg)    GENERAL:alert, no distress and comfortable SKIN: skin color, texture, turgor are normal, no rashes or significant lesions EYES: normal, conjunctiva are pink and non-injected, sclera clear OROPHARYNX:no exudate, no erythema and lips, buccal mucosa, and tongue normal  NECK: supple, thyroid  normal size, non-tender, without nodularity LYMPH:  no palpable lymphadenopathy in the cervical, axillary or inguinal LUNGS: clear to auscultation and percussion with normal breathing effort HEART: regular rate & rhythm and no murmurs and no lower extremity edema ABDOMEN:abdomen soft, non-tender and normal bowel sounds Musculoskeletal:no cyanosis of digits and no clubbing  PSYCH: alert & oriented x 3 with fluent speech NEURO: no focal motor/sensory deficits  LABORATORY DATA:  I have reviewed the data as listed Lab Results  Component Value Date   WBC 4.9 03/29/2024   HGB 10.4 (L) 03/29/2024   HCT 32.9 (L) 03/29/2024   MCV 90 03/29/2024   PLT 310 03/29/2024     Chemistry      Component Value Date/Time   NA 139 06/09/2024 1059   NA 143 06/11/2023 1522   K 3.7 06/09/2024 1059   CL 103 06/09/2024 1059    CO2 27 06/09/2024 1059   BUN 36 (H) 06/09/2024 1059   BUN 30 (H) 06/11/2023 1522   CREATININE 1.76 (H) 06/09/2024 1059   CREATININE 1.02 (H) 05/11/2020 1041      Component Value Date/Time   CALCIUM  9.2 06/09/2024 1059   ALKPHOS 106 01/13/2024 1108   AST 25 01/13/2024 1108   ALT 13 01/13/2024 1108   BILITOT 0.5 01/13/2024 1108       RADIOGRAPHIC STUDIES: I have personally reviewed the radiological images as listed and agreed with the findings in the report. MM Breast Surgical Specimen Result Date: 06/13/2024 CLINICAL DATA:  Status post RIGHT breast lumpectomy. EXAM: SPECIMEN RADIOGRAPH OF THE RIGHT BREAST COMPARISON:  Previous exam(s). FINDINGS: Status post excision of the right breast. The radioactive seed and biopsy marker clip are present, completely intact, and were marked for pathology. IMPRESSION: Specimen radiograph of the right breast. Electronically Signed   By: Aliene Lloyd M.D.   On: 06/13/2024 13:26   MM RT RADIOACTIVE SEED LOC MAMMO GUIDE Result Date: 06/10/2024 CLINICAL DATA:  78 year old female with newly diagnosed right breast invasive ductal carcinoma presenting for seed localization. EXAM: MAMMOGRAPHIC GUIDED RADIOACTIVE SEED LOCALIZATION OF THE RIGHT BREAST COMPARISON:  Previous exam(s). FINDINGS: Patient presents for radioactive seed localization prior to right breast lumpectomy. I met with the patient and we discussed the procedure of seed localization including benefits and alternatives. We discussed the high  likelihood of a successful procedure. We discussed the risks of the procedure including infection, bleeding, tissue injury and further surgery. We discussed the low dose of radioactivity involved in the procedure. Informed, written consent was given. The usual time-out protocol was performed immediately prior to the procedure. Using mammographic guidance, sterile technique, 1% lidocaine  and an I-125 radioactive seed, the ribbon biopsy marking clip in the right breast  was localized using a medial approach. The follow-up mammogram images confirm the seed in the expected location and were marked for Dr. Curvin. Follow-up survey of the patient confirms presence of the radioactive seed. Order number of I-125 seed:  [id number]. Total activity: 0.236 mCi reference Date: 02/04/2024 The patient tolerated the procedure well and was [facility]. She was given instructions regarding seed removal. IMPRESSION: Radioactive seed localization right breast. No apparent complications. Electronically Signed   By: Inocente Ast M.D.   On: 06/10/2024 10:43    All questions were answered. The patient knows to call the clinic with any problems, questions or concerns. I spent 30 minutes in the care of this patient including H and P, review of records, counseling and coordination of care.     Amber Stalls, MD 06/28/2024 10:51 AM

## 2024-07-04 DIAGNOSIS — I10 Essential (primary) hypertension: Secondary | ICD-10-CM | POA: Diagnosis not present

## 2024-07-04 DIAGNOSIS — E119 Type 2 diabetes mellitus without complications: Secondary | ICD-10-CM | POA: Diagnosis not present

## 2024-07-04 DIAGNOSIS — R809 Proteinuria, unspecified: Secondary | ICD-10-CM | POA: Diagnosis not present

## 2024-07-04 DIAGNOSIS — E211 Secondary hyperparathyroidism, not elsewhere classified: Secondary | ICD-10-CM | POA: Diagnosis not present

## 2024-07-04 DIAGNOSIS — N189 Chronic kidney disease, unspecified: Secondary | ICD-10-CM | POA: Diagnosis not present

## 2024-07-04 DIAGNOSIS — D631 Anemia in chronic kidney disease: Secondary | ICD-10-CM | POA: Diagnosis not present

## 2024-07-08 DIAGNOSIS — D638 Anemia in other chronic diseases classified elsewhere: Secondary | ICD-10-CM | POA: Diagnosis not present

## 2024-07-08 DIAGNOSIS — I129 Hypertensive chronic kidney disease with stage 1 through stage 4 chronic kidney disease, or unspecified chronic kidney disease: Secondary | ICD-10-CM | POA: Diagnosis not present

## 2024-07-08 DIAGNOSIS — N1832 Chronic kidney disease, stage 3b: Secondary | ICD-10-CM | POA: Diagnosis not present

## 2024-07-08 DIAGNOSIS — E559 Vitamin D deficiency, unspecified: Secondary | ICD-10-CM | POA: Diagnosis not present

## 2024-07-11 ENCOUNTER — Other Ambulatory Visit

## 2024-07-19 ENCOUNTER — Encounter: Admitting: Genetic Counselor

## 2024-07-19 ENCOUNTER — Other Ambulatory Visit

## 2024-07-26 ENCOUNTER — Encounter: Payer: Self-pay | Admitting: Family Medicine

## 2024-07-26 ENCOUNTER — Ambulatory Visit: Admitting: Family Medicine

## 2024-07-26 VITALS — BP 118/76 | HR 66 | Resp 16 | Ht 61.5 in | Wt 193.1 lb

## 2024-07-26 DIAGNOSIS — Z0001 Encounter for general adult medical examination with abnormal findings: Secondary | ICD-10-CM

## 2024-07-26 DIAGNOSIS — E785 Hyperlipidemia, unspecified: Secondary | ICD-10-CM

## 2024-07-26 DIAGNOSIS — Z780 Asymptomatic menopausal state: Secondary | ICD-10-CM | POA: Diagnosis not present

## 2024-07-26 DIAGNOSIS — N1832 Chronic kidney disease, stage 3b: Secondary | ICD-10-CM

## 2024-07-26 DIAGNOSIS — R7989 Other specified abnormal findings of blood chemistry: Secondary | ICD-10-CM | POA: Diagnosis not present

## 2024-07-26 DIAGNOSIS — Z9011 Acquired absence of right breast and nipple: Secondary | ICD-10-CM | POA: Diagnosis not present

## 2024-07-26 MED ORDER — OYSTER SHELL CALCIUM/D3 500-5 MG-MCG PO TABS: 1.0000 | ORAL_TABLET | Freq: Two times a day (BID) | ORAL | 3 refills | Status: AC

## 2024-07-26 MED ORDER — UNABLE TO FIND: 0 refills | Status: AC

## 2024-07-26 MED ORDER — TERBINAFINE HCL 1 % EX CREA: TOPICAL_CREAM | CUTANEOUS | 0 refills | Status: AC

## 2024-07-26 NOTE — Patient Instructions (Signed)
 F/U in 5 months  Annual exam with Dr Tobie in 1 year  Wellness with nurse over due please schedule at checkout  Please schedule bone density test in [place] at checkout  New is twice daily calcium  with D for your bones,and PLEASE commit to walking  for 30 mins at least 5 days per week continue the Vit D tablets that you already take  I will contact Dr Rachele and get back to you re fatigue  Nurse pls order mastectomy supplies and bras for pt and let her know where she can get them ( right breast)  Pls print and give additonal lab test TSH and free T4 to lab to be added to today  It is important that you exercise regularly at least 30 minutes 5 times a week. If you develop chest pain, have severe difficulty breathing, or feel very tired, stop exercising immediately and seek medical attention

## 2024-07-26 NOTE — Assessment & Plan Note (Signed)

## 2024-07-26 NOTE — Progress Notes (Signed)
    Desiree [name]     MRN: [id number]      DOB: 1945-12-20  Chief Complaint  Patient presents with   Annual Exam    Cpe     HPI: Patient is in for annual physical exam. . Immunization is reviewed , and  updated if needed.   PE: BP 118/76   Pulse 66   Resp 16   Ht 5' 1.5 (1.562 m)   Wt 193 lb 1.3 oz (87.6 kg)   SpO2 92%   BMI 35.89 kg/m   Pleasant  female, alert and oriented x 3, in no cardio-pulmonary distress. Afebrile. HEENT No facial trauma or asymetry. Sinuses non tender.  Extra occullar muscles intact.. External ears normal, . Neck: supple, no adenopathy,JVD or thyromegaly.No bruits.  Chest: Clear to ascultation bilaterally.No crackles or wheezes. Non tender to palpation   Cardiovascular system; Heart sounds normal,  S1 and  S2 ,no S3.  No murmur, or thrill. Apical beat not displaced Peripheral pulses normal.  Abdomen: Soft, non tender, no organomegaly or masses. No bruits. Bowel sounds normal. No guarding, tenderness or rebound.   GU: External genitalia normal female genitalia , normal female distribution of hair. No lesions. Urethral meatus normal in size, no  Prolapse, no lesions visibly  Present. Bladder non tender. Vagina pink and moist , with no visible lesions , discharge present . Adequate pelvic support no  cystocele or rectocele noted Cervix pink and appears healthy, no lesions or ulcerations noted, no discharge noted from os Uterus normal size, no adnexal masses, no cervical motion or adnexal tenderness.   Musculoskeletal exam: Full ROM of spine, hips , shoulders and knees. No deformity ,swelling or crepitus noted. No muscle wasting or atrophy.   Neurologic: Cranial nerves 2 to 12 intact. Power, tone ,sensation and reflexes normal throughout. No disturbance in gait. No tremor.  Skin: Intact, no ulceration, erythema , scaling or rash noted. Pigmentation normal throughout  Psych; Normal mood and affect. Judgement and  concentration normal   Assessment & Plan:  Encounter for Medicare annual examination with abnormal findings Annual exam as documented. Counseling done  re healthy lifestyle involving commitment to 150 minutes exercise per week, heart healthy diet, and attaining healthy weight.The importance of adequate sleep also discussed. Regular seat belt use and home safety, is also discussed. Changes in health habits are decided on by the patient with goals and time frames  set for achieving them. Immunization and cancer screening needs are specifically addressed at this visit.

## 2024-07-27 ENCOUNTER — Ambulatory Visit: Payer: Self-pay | Admitting: Family Medicine

## 2024-07-27 DIAGNOSIS — R7989 Other specified abnormal findings of blood chemistry: Secondary | ICD-10-CM

## 2024-07-27 LAB — TSH+FREE T4
Free T4: 1.11 ng/dL (ref 0.82–1.77)
TSH: 6.46 u[IU]/mL — ABNORMAL HIGH (ref 0.450–4.500)

## 2024-07-27 LAB — LIPID PANEL
Chol/HDL Ratio: 2.2 ratio (ref 0.0–4.4)
Cholesterol, Total: 178 mg/dL (ref 100–199)
HDL: 81 mg/dL (ref >39)
LDL Chol Calc (NIH): 86 mg/dL (ref 0–99)
Triglycerides: 57 mg/dL (ref 0–149)
VLDL Cholesterol Cal: 11 mg/dL (ref 5–40)

## 2024-07-27 LAB — CMP14+EGFR
ALT: 9 IU/L (ref 0–32)
AST: 25 IU/L (ref 0–40)
Albumin: 4.1 g/dL (ref 3.8–4.8)
Alkaline Phosphatase: 101 IU/L (ref 49–135)
BUN/Creatinine Ratio: 24 (ref 12–28)
BUN: 34 mg/dL — ABNORMAL HIGH (ref 8–27)
Bilirubin Total: 0.4 mg/dL (ref 0.0–1.2)
CO2: 24 mmol/L (ref 20–29)
Calcium: 9.7 mg/dL (ref 8.7–10.3)
Chloride: 99 mmol/L (ref 96–106)
Creatinine, Ser: 1.39 mg/dL — ABNORMAL HIGH (ref 0.57–1.00)
Globulin, Total: 3 g/dL (ref 1.5–4.5)
Glucose: 101 mg/dL — ABNORMAL HIGH (ref 70–99)
Potassium: 3.6 mmol/L (ref 3.5–5.2)
Sodium: 142 mmol/L (ref 134–144)
Total Protein: 7.1 g/dL (ref 6.0–8.5)
eGFR: 39 mL/min/1.73 — ABNORMAL LOW (ref >59)

## 2024-07-28 ENCOUNTER — Inpatient Hospital Stay (HOSPITAL_COMMUNITY): Admission: RE | Admit: 2024-07-28 | Discharge: 2024-07-28 | Attending: Family Medicine | Admitting: Family Medicine

## 2024-07-28 DIAGNOSIS — Z780 Asymptomatic menopausal state: Secondary | ICD-10-CM | POA: Insufficient documentation

## 2024-07-28 DIAGNOSIS — M85852 Other specified disorders of bone density and structure, left thigh: Secondary | ICD-10-CM | POA: Diagnosis not present

## 2024-07-28 DIAGNOSIS — M85851 Other specified disorders of bone density and structure, right thigh: Secondary | ICD-10-CM | POA: Diagnosis not present

## 2024-07-29 ENCOUNTER — Other Ambulatory Visit (HOSPITAL_COMMUNITY)

## 2024-08-03 ENCOUNTER — Ambulatory Visit (HOSPITAL_COMMUNITY): Admission: RE | Admit: 2024-08-03 | Discharge: 2024-08-03 | Attending: Family Medicine | Admitting: Family Medicine

## 2024-08-03 DIAGNOSIS — R7989 Other specified abnormal findings of blood chemistry: Secondary | ICD-10-CM | POA: Diagnosis present

## 2024-08-04 ENCOUNTER — Ambulatory Visit: Payer: Self-pay | Admitting: Family Medicine

## 2024-08-12 ENCOUNTER — Inpatient Hospital Stay: Attending: Hematology and Oncology

## 2024-08-12 DIAGNOSIS — C50311 Malignant neoplasm of lower-inner quadrant of right female breast: Secondary | ICD-10-CM

## 2024-08-12 LAB — GENETIC SCREENING ORDER

## 2024-08-12 NOTE — Progress Notes (Addendum)
 REFERRING PROVIDER: [name], MD [address] [place],  [place] [address]  PRIMARY PROVIDER:  [name], MD  PRIMARY REASON FOR VISIT:  1. Malignant neoplasm of lower-inner quadrant of right breast of female, estrogen receptor positive (HCC)     HISTORY OF PRESENT ILLNESS:   Desiree [name], a 78 y.o. female, was seen on 08/12/2024 for a [facility] cancer genetics consultation at the request of Dr. Curvin due to a personal history of metasynchronous breast cancer. Her first cancer diagnosis was at age 37. Her second breast cancer diagnosis was at age 4.  Desiree [name] presents to clinic today to discuss the possibility of a hereditary predisposition to cancer, genetic testing, and to further clarify her future cancer risks, as well as potential cancer risks for family members.   CANCER HISTORY:  Oncology History  Malignant neoplasm of female breast (HCC)  02/04/2008 Initial Diagnosis   Malignant neoplasm of female breast [facility])    Mammogram   Further evaluation is suggested for possible asymmetry in the right breast. Targeted sonographic evaluation of the RIGHT breast demonstrates an oval mass with indistinct and microlobulated margins with posterior acoustic shadowing, measuring 0.7 x 0.5 x 0.5 cm at 5 o'clock 5 CMFN. This corresponds to the abnormality seen on mammogram.    Breast US    Suspicious 0.7 cm RIGHT breast mass (5 o'clock 5 CMFN) corresponding to the abnormality seen on mammogram.    Pathology Results   BREAST MASS, RIGHT 5:00 5 CMFN, NEEDLE CORE BIOPSY:  - Invasive ductal carcinoma, see comment  - Tubule formation: Score 3  - Nuclear pleomorphism: Score 3  - Mitotic count: Score 1  - Total score: 7  - Overall Grade: 2  - Lymphovascular invasion: Not identified  - Cancer Length: 0.6 cm  - Calcifications: Present  - Other findings: None   The tumor cells are NEGATIVE for Her2 (O).   Estrogen Receptor:        70%, POSITIVE, MODERATE STAINING  INTENSITY  Progesterone Receptor:    90%, POSITIVE, STRONG STAINING INTENSITY  Proliferation Marker Ki-67:      5%      Malignant neoplasm of lower-inner quadrant of right breast of female, estrogen receptor positive (HCC)  04/29/2024 Initial Diagnosis   Malignant neoplasm of lower-inner quadrant of right breast of female, estrogen receptor positive (HCC)   05/02/2024 Cancer Staging   Staging form: Breast, AJCC 8th Edition - Clinical stage from 05/02/2024: Stage IA (cT1b, cN0, cM0, G2, ER+, PR+, HER2-) - Signed by Wyatt Leeroy HERO, PA-C on 05/02/2024 Stage prefix: Initial diagnosis Histologic grading system: 3 grade system     In 2006, at the age of 19, Desiree [name] was 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 She had a right lumpectomy and radiation, no antiestrogen therapy.  In 2025, at the age of 58, Desiree [name] was diagnosed with right ductal adenocarcinoma, ER/PR positive and HER2 negative. She is s/p lumpectomy. No radiation was administered due to the previous radiation exposure. She is on an aromatase inhibitor.  RELEVANT MEDICAL HISTORY AND RISK FACTORS:  Menarche was at age 2.  First live birth at age 82.  OCP use for approximately 0 years.  Ovaries intact: no.  Hysterectomy: yes. Total abdominal hysterectomy with bilateral salpingoophorectomy in 1990. Menopausal status: postmenopausal.  HRT use: 0 years. Mammogram within the last year: yes. Breast density: B Up to date with pelvic exams: n/a. Colonoscopy:  January 2010-  1 TA, 1 hyperplastic polyp June 2015- no polyps August 2025- diminutive tubular adenoma. reports she is no longer doing any more colonoscopies  Any excessive radiation exposure in the past: yes, as a part of previous breast cancer treatment  Other cancer screenings: no.  Exposures: no.   Past Medical History:  Diagnosis Date   Allergic rhinitis    Arthritis HIPS/ WRISTS   Breast cancer (HCC) 2006   right breasr    DDD (degenerative disc disease), cervical    Family history of breast cancer    Family history of lung cancer    Family history of prostate cancer    Family history of throat cancer    GERD (gastroesophageal reflux disease) OCCASIONAL   Heart murmur    turbulent flow through LVOT, no obstruction 08/26/18 echo   History of Bell's palsy 2011  RIGHT SIDE -- RESOLVED   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--  S/P  RIGHT MASTECTMOY AND RADIATION--  NO RECURRANCE   History of CVA (cerebrovascular accident) PER SCAN IN 2011   Hyperlipidemia    Hypertension    Impaired fasting glucose PER PCP  DR SIMPSON   WATCH DIET   Mixed urge and stress incontinence    Nocturia    Sinus drainage    Thoracic ascending aortic aneurysm LAST CHEST CT 09-02-2010--  FOLLOWED BY  CARDIOLOGIST--  DR [id number]   Vaginal wall prolapse     Past Surgical History:  Procedure Laterality Date   ABDOMINAL HYSTERECTOMY     BILATERAL CARPAL TUNNEL RELEASE  1980'S   BREAST BIOPSY Right 04/14/2024   US  RT BREAST BX W LOC DEV 1ST LESION IMG BX SPEC US  GUIDE 04/14/2024 [facility]   BREAST BIOPSY  06/10/2024   MM RT RADIOACTIVE SEED LOC MAMMO GUIDE 06/10/2024 [facility]MO[facility]   BREAST LUMPECTOMY WITH RADIOACTIVE SEED LOCALIZATION Right 06/13/2024   Procedure: BREAST LUMPECTOMY WITH RADIOACTIVE SEED LOCALIZATION;  Surgeon: [name], MD;  Location: [facility];  Service: General;  Laterality: Right;   CARDIAC CATHETERIZATION  02-01-2009  DR EICHHORN   NORMAL CORONARY ARTERIES/ NORMAL LV SIZE AND FUNCTION/ AORTIC ROOT SIZE AT THE UPPER LIMIT OF NORMAL    CARPAL TUNNEL RELEASE     CERVICAL DISC SURGERY  12-12-2005  DR UNICE   LEFT  C6 - C7 HERINATED / DDD/ SPONDYLOSIS   COLONOSCOPY N/A 02/10/2014   Surgeon: Margo LITTIE Haddock, MD; Normal TI, mild diverticulosis in sigmoid and transverse colon, small external hemorrhoids, no obvious source for anemia.  Repeat colonoscopy in 10-15 years if  benefits outweigh risk.   COLONOSCOPY N/A 04/19/2024   Procedure: COLONOSCOPY;  Surgeon: [name], DO;  [facility];  Service: Endoscopy;  Laterality: N/A;  9:00 AM,  OK RM 1-2   CYSTOCELE REPAIR  08/02/2012   Procedure: ANTERIOR REPAIR (CYSTOCELE);  Surgeon: Arlena LILLETTE Gal, MD;  Location: [facility];  Service: Urology;  Laterality: N/A;  [employer] Uphold Anterior Pelvic Floor Sacrospinus Repair. Anterior wall of the vagina.   ESOPHAGOGASTRODUODENOSCOPY N/A 02/10/2014   Surgeon: Margo LITTIE Haddock, MD;   Mild gastritis/duodenitis s/p biopsied, no obvious source for anemia.  Pathology with mild chronic gastritis with intestinal metaplasia, negative for H. pylori, duodenal biopsy benign.   GIVENS CAPSULE STUDY N/A 03/13/2014   Surgeon: Margo LITTIE Haddock, MD;  Normal exam.  Suspected anemia most likely due to chronic disease.   JOINT REPLACEMENT Right    [employer] 06/16/2020- knee  NECK SURGERY  2007   RIGHT PARTIAL MASTECTOMY  11-11-2004  DR PETER YOUNG   DUCTAL CARCINOMA IN SITU RIGHT BREAST   TOTAL ABDOMINAL HYSTERECTOMY W/ BILATERAL SALPINGOOPHORECTOMY  1990   TOTAL KNEE ARTHROPLASTY Right 12/21/2019   Procedure: RIGHT  TOTAL KNEE ARTHROPLASTY;  Surgeon: Barbarann Oneil BROCKS, MD;  Location: [facility];  Service: Orthopedics;  Laterality: Right;   TRANSTHORACIC ECHOCARDIOGRAM  03-30-2008  DR MARGARET SIMPSON   LV SIZE AND FUNCTION NORMAL/ MODERATE AORTIC ARCH DILATATION/ MILD MR    Social History   Socioeconomic History   Marital status: Divorced    Spouse name: Not on file   Number of children: 4   Years of education: 12+   Highest education level: Some college, no degree  Occupational History   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  Tobacco Use   Smoking status: Never   Smokeless tobacco: Never  Vaping Use   Vaping status: Never Used  Substance and Sexual Activity   Alcohol  use: No   Drug use: No    Sexual activity: Never  Other Topics Concern   Not on file  Social History Narrative   RETIRED IN June 2020.    Social Drivers of Health   Tobacco Use: Low Risk (07/26/2024)   Patient History    Smoking Tobacco Use: Never    Smokeless Tobacco Use: Never    Passive Exposure: Not on file  Financial Resource Strain: Low Risk (05/06/2024)   Overall Financial Resource Strain (CARDIA)    Difficulty of Paying Living Expenses: Not hard at all  Food Insecurity: No Food Insecurity (05/06/2024)   Epic    Worried About Programme Researcher, Broadcasting/film/video in the Last Year: Never true    Ran Out of Food in the Last Year: Never true  Transportation Needs: No Transportation Needs (05/02/2024)   Epic    Lack of Transportation (Medical): No    Lack of Transportation (Non-Medical): No  Physical Activity: Not on file  Stress: Not on file  Social Connections: Moderately Integrated (05/06/2024)   Social Connection and Isolation Panel    Frequency of Communication with Friends and Family: More than three times a week    Frequency of Social Gatherings with Friends and Family: More than three times a week    Attends Religious Services: More than 4 times per year    Active Member of Clubs or Organizations: Yes    Attends Banker Meetings: More than 4 times per year    Marital Status: Divorced  Depression (PHQ2-9): Low Risk (07/26/2024)   Depression (PHQ2-9)    PHQ-2 Score: 3  Alcohol  Screen: Not on file  Housing: Low Risk (05/06/2024)   Epic    Unable to Pay for Housing in the Last Year: No    Number of Times Moved in the Last Year: 0    Homeless in the Last Year: No  Utilities: Not At Risk (05/06/2024)   Epic    Threatened with loss of utilities: No  Health Literacy: Not on file     FAMILY HISTORY:  We obtained a detailed, 4-generation family history.  Significant diagnoses are listed below: Family History  Problem Relation Age of Onset   Kidney failure Mother    Hypertension Mother    Heart attack  Mother 63   Throat cancer Father    Prostate cancer Father    Lung cancer Father 17   Breast cancer Sister 53  first at 54yo and second time at 77yo   Hypertension Brother        2 stents   Breast cancer Maternal Aunt    Breast cancer Maternal Aunt    Colon cancer Neg Hx    Colon polyps Neg Hx      Desiree [name] reports the following family history information:  Her sister has had breast cancer twice. Once when she was 54 and another time when she was 62. She has two maternal aunts that have had breast cancer at unknown ages.  She has a maternal first cousin that had breast cancer at age 35 and she is still living at 48.  She reports her father had throat cancer, then prostate cancer, then lung cancer at 32. She reports these were separate cancers. She reports he was a chain smoker. She has limited information about her paternal relatives. She does not report anyone on her fathers side of the family with cancer other than her father.  Desiree [name] is unaware of previous family history of genetic testing for hereditary cancer risks. There is no reported Ashkenazi Jewish ancestry. There is no known consanguinity.  GENETIC COUNSELING ASSESSMENT:  Ms. Huntoon is a 78 y.o. female with a personal and family history of breast cancer which is somewhat suggestive of a hereditary cancer syndrome and predisposition to cancer given this history. We, therefore, discussed and recommended the following at today's visit.   DISCUSSION: We discussed that, in general, most cancer is not inherited in families, but instead is sporadic or familial. Sporadic cancers occur by chance and typically happen at older ages (>50 years) as this type of cancer is caused by genetic changes acquired during an individuals lifetime. Some families have more cancers than would be expected by chance; however, the ages or types of cancer are not consistent with a known genetic mutation or known genetic mutations have been ruled  out. This type of familial cancer is thought to be due to a combination of multiple genetic, environmental, hormonal, and lifestyle factors. While this combination of factors likely increases the risk of cancer, the exact source of this risk is not currently identifiable or testable.  We discussed that 5 - 10% of breast cancer is hereditary. Most cases of hereditary breast cancer are associated with BRCA1 and BRCA2 genes, although there are other genes associated with hereditary breast cancer as well. Cancer risks and management strategies are gene specific. We discussed that genetic testing can beneficial for several reasons, including clarifying specific cancer risks, identifying potential screening and risk-reduction options that may be appropriate, and to understand if other family members could be at risk for cancer and allow them to undergo genetic testing to clarify their cancer risks.   We reviewed the characteristics, features and inheritance patterns of hereditary cancer syndromes. We also discussed genetic testing, including the appropriate family members to test, the process of testing, insurance coverage and turn-around-time for results. We discussed the implications of a negative, positive, carrier and/or variant of uncertain significant result.   Desiree [name] was offered the [facility]Next + RNAinsight gene panel which includes sequencing, rearrangement analysis, and RNA analysis for the following 40 genes: APC, ATM, BAP1, BARD1, BMPR1A, BRCA1, BRCA2, BRIP1, CDH1, CDKN2A, CHEK2, FH, FLCN, MET, MLH1, MSH2, MSH6, MUTYH, NF1, NTHL1, PALB2, PMS2, PTEN, RAD51C, RAD51D, RPS20, SMAD4, STK11, TP53, TSC1, TSC2, and VHL (sequencing and deletion/duplication); AXIN2, HOXB13, MBD4, MSH3, POLD1 and POLE (sequencing only); EPCAM and GREM1 (deletion/duplication only).   Desiree [name] was  also offered the [facility] CancerNext-Expanded + RNAinsight gene panel which includes sequencing, rearrangement, and RNA analysis  for the following 77 genes: AIP, ALK, APC, ATM, AXIN2, BAP1, BARD1, BMPR1A, BRCA1, BRCA2, BRIP1, CDC73, CDH1, CDK4, CDKN1B, CDKN2A, CEBPA, CHEK2, CTNNA1, DDX41, DICER1, ETV6, FH, FLCN, GATA2, LZTR1, MAX, MBD4, MEN1, MET, MLH1, MSH2, MSH3, MSH6, MUTYH, NF1, NF2, NTHL1, PALB2, PHOX2B, PMS2, POT1, PRKAR1A, PTCH1, PTEN, RAD51C, RAD51D, RB1, RET, RPS20, RUNX1, SDHA, SDHAF2, SDHB, SDHC, SDHD, SMAD4, SMARCA4, SMARCB1, SMARCE1, STK11, SUFU, TMEM127, TP53, TSC1, TSC2, VHL, and WT1 (sequencing and deletion/duplication); EGFR, HOXB13, KIT, MITF, PDGFRA, POLD1, and POLE (sequencing only); EPCAM and GREM1 (deletion/duplication only).    Desiree [name] was informed of the benefits and limitations of each panel, including that expanded pan-cancer panels contain genes may not have clear management guidelines at this point in time. We also discussed that as the number of genes included on a panel increases, the chances of variants of uncertain significance increases. After considering the risks, benefits, and limitations, Desiree [name] provided informed consent to pursue genetic testing. Desiree [name] decided to pursue genetic testing for the [facility] + RNA 40 gene panel.   Based on Desiree [name]'s personal history of cancer, she meets medical criteria for genetic testing. she meets criteria due to have metasynchronous breast cancers. She also meets criteria due to having 3 close relatives on the same side of the family with breast cancer including herself. Despite that she meets criteria, she may still have an out of pocket cost. We discussed that if her out of pocket cost for testing is over $100, the laboratory will call and confirm whether she wants to proceed with testing.  If the out of pocket cost of testing is less than $100 she will be billed by the genetic testing laboratory. We filled out the financial assistance application during the appointment today and her estimated out of pocket cost was $0.   We discussed that  some people do not want to undergo genetic testing due to fear of genetic discrimination.  The Genetic Information Nondiscrimination Act (GINA) was signed into federal law in 2008. GINA prohibits health insurers and most employers from discriminating against individuals based on genetic information (including the results of genetic tests and family history information). According to GINA, health insurance companies cannot consider genetic information to be a preexisting condition, nor can they use it to make decisions regarding coverage or rates. GINA also makes it illegal for most employers to use genetic information in making decisions about hiring, firing, promotion, or terms of employment. It is important to note that GINA does not offer protections for life insurance, disability insurance, or long-term care insurance. GINA does not apply to those in the [employer], those who work for companies with less than 15 employees, and new life insurance or long-term disability insurance policies.  Health status due to a cancer diagnosis is not protected under GINA. More information about GINA can be found by visiting eliteclients.be.  PLAN: After considering the risks, benefits, and limitations, Desiree [name] provided informed consent to pursue genetic testing and the blood sample was sent to [facility] for analysis of the [facility] Cancer[facility] + RNA 40 gene panel. Results should be available within approximately 2-3 weeks' time, at which point they will be disclosed by telephone to Desiree [name], as will any additional recommendations warranted by these results. Desiree [name] will receive a summary of her genetic counseling visit and a copy of her results once available. This information will also be  available in Epic.   RESOURCES PROVIDED:  Desiree [name] was provided with the following:  [facility] Billing information  [facility] Contact card Pricilla Fuss)  Lastly, we encouraged Ms. Thompson to  remain in contact with cancer genetics annually so that we can continuously update the family history and inform her of any changes in cancer genetics and testing that may be of benefit for this family.   Ms. Amble questions were answered to her satisfaction today. Our contact information was provided should additional questions or concerns arise. Thank you for the referral and allowing us  to share in the care of your patient.   Jamariyah Johannsen R. Bluford, MS, [facility] Certified [email or web address] phone: [phone]  I personally spent a total of 60 minutes in the care of the patient today including preparing to see the patient, getting/reviewing separately obtained history, counseling and educating, placing orders, and documenting clinical information in the EHR. The patient brought her daughter, Shona. Drs. Lanny Stalls, and/or Gudena were available for questions, if needed.   _______________________________________________________________________ For Office Staff:  Number of people involved in session: 2 Was an Intern/ student involved with case: no

## 2024-08-22 ENCOUNTER — Telehealth: Payer: Self-pay | Admitting: Genetic Counselor

## 2024-08-23 ENCOUNTER — Ambulatory Visit: Payer: Self-pay | Admitting: Genetic Counselor

## 2024-08-23 DIAGNOSIS — Z1379 Encounter for other screening for genetic and chromosomal anomalies: Secondary | ICD-10-CM | POA: Insufficient documentation

## 2024-08-23 NOTE — Telephone Encounter (Signed)
 I spoke to Desiree [name] to review results of genetic testing, completed with [facility] CancerNext +RNA panel. Testing did not identify any variants known to increase the risk for cancer, but did identify a variant of unknown significance (VUS) in BMPR1A. We discussed that we do not use VUS to make management decisions or identify at risk family members.  Discussed that we do not know why she has breast cancer or why there is cancer in the family. It is possible that the cancers in history occurred by chance or due to environmental factors. It is possible there are family members that have a variant that she did not inherit. It is also possible there is a hereditary cause for cancer that cannot be identified with current technology or due to a gene that was not tested. It will be important for her to keep in contact with genetics to keep up with whether additional testing may be needed.  Please see counseling note for further detail on this result.

## 2024-08-23 NOTE — Progress Notes (Signed)
 HPI:  Ms. Desiree [name] was previously seen in the [facility] due to a personal and family history of cancer and concerns regarding a hereditary predisposition to cancer. Please refer to our prior cancer genetics clinic note for more information regarding our discussion, assessment and recommendations, at the time. Ms. Desiree [name] recent genetic test results were disclosed to her, as were recommendations warranted by these results. These results and recommendations are discussed in more detail below.  Results were disclosed by telephone on 08/23/24.   CANCER HISTORY:  Oncology History  Malignant neoplasm of female breast (HCC)  02/04/2008 Initial Diagnosis   Malignant neoplasm of female breast [facility])    Mammogram   Further evaluation is suggested for possible asymmetry in the right breast. Targeted sonographic evaluation of the RIGHT breast demonstrates an oval mass with indistinct and microlobulated margins with posterior acoustic shadowing, measuring 0.7 x 0.5 x 0.5 cm at 5 o'clock 5 CMFN. This corresponds to the abnormality seen on mammogram.    Breast US    Suspicious 0.7 cm RIGHT breast mass (5 o'clock 5 CMFN) corresponding to the abnormality seen on mammogram.    Pathology Results   BREAST MASS, RIGHT 5:00 5 CMFN, NEEDLE CORE BIOPSY:  - Invasive ductal carcinoma, see comment  - Tubule formation: Score 3  - Nuclear pleomorphism: Score 3  - Mitotic count: Score 1  - Total score: 7  - Overall Grade: 2  - Lymphovascular invasion: Not identified  - Cancer Length: 0.6 cm  - Calcifications: Present  - Other findings: None   The tumor cells are NEGATIVE for Her2 (O).   Estrogen Receptor:        70%, POSITIVE, MODERATE STAINING INTENSITY  Progesterone Receptor:    90%, POSITIVE, STRONG STAINING INTENSITY  Proliferation Marker Ki-67:      5%      08/20/2024 Genetic Testing   Negative CancerNext +RNA, VUS in BMPR1A p.R329T (c.986G>C). Report date 08/20/24.   The [facility]  CancerNext+RNAinsight Panel includes sequencing, rearrangement analysis, and RNA analysis for the following 40 genes: APC, ATM, BAP1, BARD1, BMPR1A, BRCA1, BRCA2, BRIP1, CDH1, CDKN2A, CHEK2, FH, FLCN, MET, MLH1, MSH2, MSH6, MUTYH, NF1, NTHL1, PALB2, PMS2, PTEN, RAD51C, RAD51D, RPS20, SMAD4, STK11, TP53, TSC1, TSC2 and VHL (sequencing and deletion/duplication); AXIN2, HOXB13, MBD4, MSH3, POLD1 and POLE (sequencing only); EPCAM and GREM1 (deletion/duplication only).    Malignant neoplasm of lower-inner quadrant of right breast of female, estrogen receptor positive (HCC)  04/29/2024 Initial Diagnosis   Malignant neoplasm of lower-inner quadrant of right breast of female, estrogen receptor positive (HCC)   05/02/2024 Cancer Staging   Staging form: Breast, AJCC 8th Edition - Clinical stage from 05/02/2024: Stage IA (cT1b, cN0, cM0, G2, ER+, PR+, HER2-) - Signed by Wyatt Leeroy HERO, PA-C on 05/02/2024 Stage prefix: Initial diagnosis Histologic grading system: 3 grade system   08/20/2024 Genetic Testing   Negative CancerNext +RNA, VUS in BMPR1A p.R329T (c.986G>C). Report date 08/20/24.   The [facility] CancerNext+RNAinsight Panel includes sequencing, rearrangement analysis, and RNA analysis for the following 40 genes: APC, ATM, BAP1, BARD1, BMPR1A, BRCA1, BRCA2, BRIP1, CDH1, CDKN2A, CHEK2, FH, FLCN, MET, MLH1, MSH2, MSH6, MUTYH, NF1, NTHL1, PALB2, PMS2, PTEN, RAD51C, RAD51D, RPS20, SMAD4, STK11, TP53, TSC1, TSC2 and VHL (sequencing and deletion/duplication); AXIN2, HOXB13, MBD4, MSH3, POLD1 and POLE (sequencing only); EPCAM and GREM1 (deletion/duplication only).      FAMILY HISTORY:  We obtained a detailed, 4-generation family history.  Significant diagnoses are listed below: Family History  Problem Relation Age of  Onset   Kidney failure Mother    Hypertension Mother    Heart attack Mother 21   Throat cancer Father    Prostate cancer Father    Lung cancer Father 73   Breast cancer Sister 45       first at  57yo and second time at 77yo   Hypertension Brother        2 stents   Breast cancer Maternal Aunt    Breast cancer Maternal Aunt    Colon cancer Neg Hx    Colon polyps Neg Hx          Ms. Desiree [name] reports the following family history information:   Her sister has had breast cancer twice. Once when she was 54 and another time when she was 32. She has two maternal aunts that have had breast cancer at unknown ages.  She has a maternal first cousin that had breast cancer at age 81 and she is still living at 15.   She reports her father had throat cancer, then prostate cancer, then lung cancer at 27. She reports these were separate cancers. She reports he was a chain smoker. She has limited information about her paternal relatives. She does not report anyone on her fathers side of the family with cancer other than her father.   Ms. Desiree [name] is unaware of previous family history of genetic testing for hereditary cancer risks. There is no reported Ashkenazi Jewish ancestry. There is no known consanguinity.  GENETIC TEST RESULTS: Genetic testing reported out on 08/20/24 through the [facility] CancerNext +RNA panel found no pathogenic mutations. The [facility] CancerNext+RNAinsight Panel includes sequencing, rearrangement analysis, and RNA analysis for the following 40 genes: APC, ATM, BAP1, BARD1, BMPR1A, BRCA1, BRCA2, BRIP1, CDH1, CDKN2A, CHEK2, FH, FLCN, MET, MLH1, MSH2, MSH6, MUTYH, NF1, NTHL1, PALB2, PMS2, PTEN, RAD51C, RAD51D, RPS20, SMAD4, STK11, TP53, TSC1, TSC2 and VHL (sequencing and deletion/duplication); AXIN2, HOXB13, MBD4, MSH3, POLD1 and POLE (sequencing only); EPCAM and GREM1 (deletion/duplication only). The test report has been scanned into EPIC and is located under the Molecular Pathology section of the Results Review tab.  A portion of the result report is included below for reference.     As current genetic testing is not perfect, it is possible there may be a gene mutation in one of these  genes that current testing cannot detect, but that chance is small.  We also discussed, that there could be another gene that has not yet been discovered, or that we have not yet tested, that is responsible for the cancer diagnoses in the family. It is also possible there is a hereditary cause for the cancer in the family that Ms. Desiree [name] did not inherit and therefore was not identified in her testing.  Therefore, it is important to remain in touch with cancer genetics in the future so that we can continue to offer Ms. Desiree [name] the most up to date genetic testing.   Genetic testing did identify a variant of uncertain significance (VUS) was identified in the BMPR1A gene called p.R329T (c.986G>C).  At this time, it is unknown if this variant is associated with increased cancer risk or if this is a normal finding, but most variants such as this get reclassified to being inconsequential. It should not be used to make medical management decisions. With time, we suspect the lab will determine the significance of this variant, if any. If we do learn more about it, we will try to contact Ms. Desiree [name] to discuss it further.  However, it is important to stay in touch with us  periodically and keep the address and phone number up to date.  ADDITIONAL GENETIC TESTING: Ms. Desiree [name] should be aware that there are other genes that are associated with increased cancer risk that can be analyzed. Should Ms. Desiree [name] wish to pursue additional genetic testing, we are happy to discuss and coordinate this testing, at any time.    CANCER SCREENING RECOMMENDATIONS: Ms. Desiree [name] test result is considered negative (normal).  This means that we have not identified a hereditary cause for her personal and family history of cancer at this time. Most cancers happen by chance and this negative test suggests that her personal and family history of cancer may fall into this category.    Possible reasons for Ms. Desiree [name]'s negative genetic test  include:  1. There may be a gene mutation in one of these genes that current testing methods cannot detect. The likelihood of this is low, but possible.   2. There could be another gene that has not yet been discovered, or that we have not yet tested, that is responsible for the cancer diagnoses in the family.  3.  There may be no hereditary risk for cancer in the family. The cancers in Ms. Desiree [name] and/or her family may be sporadic/familial or due to other genetic and environmental factors. 4. It is also possible there is a hereditary cause for the cancer in the family that Ms. Desiree [name] did not inherit.  Therefore, it is recommended she continue to follow the cancer management and screening guidelines provided by her oncology and primary healthcare provider. An individual's cancer risk and medical management are not determined by genetic test results alone. Overall cancer risk assessment incorporates additional factors, including personal medical history, family history, and any available genetic information that may result in a personalized plan for cancer prevention and surveillance  Given Ms. Desiree [name]'s personal and family histories, we must interpret these negative results with some caution.  Families with features suggestive of hereditary risk for cancer tend to have multiple family members with cancer, diagnoses in multiple generations and diagnoses before the age of 33. Ms. Desiree [name] family exhibits some of these features. Thus, this result may simply reflect our current inability to detect all mutations within these genes or there may be a different gene that has not yet been discovered or tested.   RECOMMENDATIONS FOR FAMILY MEMBERS:  Individuals in this family might be at some increased risk of developing cancer, over the general population risk, simply due to the family history of cancer.  We recommended women in this family have a yearly mammogram beginning at age 29, or 6 years younger than the  earliest onset of cancer, an annual clinical breast exam, and perform monthly breast self-exams. Women in this family should also have a gynecological exam as recommended by their primary provider. All family members should be referred for colonoscopy starting at age 52, or 16 years younger than the earliest onset of cancer.  Other relatives may benefit from completing their own genetic testing, especially if they have been diagnosed with cancer.   FOLLOW-UP: Cancer genetics is a rapidly advancing field and it is possible that new genetic tests will be appropriate for Ms. Desiree [name] and/or her family members in the future. We encouraged her to remain in contact with cancer genetics on an annual basis so we can update her personal and family histories and let her know of advances in cancer genetics that may benefit this family.  Our contact number was provided. Ms. Desiree [name] questions were answered to her satisfaction, and she knows she is welcome to call us  at anytime with additional questions or concerns.   Burnard Ogren, MS, [facility] Licensed, [email or web address] [phone]

## 2024-08-30 ENCOUNTER — Other Ambulatory Visit: Payer: Self-pay | Admitting: Family Medicine

## 2024-09-01 ENCOUNTER — Other Ambulatory Visit: Payer: Self-pay | Admitting: Thoracic Surgery (Cardiothoracic Vascular Surgery)

## 2024-09-01 DIAGNOSIS — I7121 Aneurysm of the ascending aorta, without rupture: Secondary | ICD-10-CM

## 2024-09-02 ENCOUNTER — Telehealth: Payer: Self-pay | Admitting: Adult Health

## 2024-09-02 NOTE — Telephone Encounter (Signed)
 I spoke with patient and she is rescheduled for [facility] appointment from 09/29/2024 to 10/12/2024.

## 2024-09-22 ENCOUNTER — Other Ambulatory Visit: Payer: Self-pay | Admitting: Family Medicine

## 2024-09-29 ENCOUNTER — Inpatient Hospital Stay: Admitting: Adult Health

## 2024-10-03 ENCOUNTER — Ambulatory Visit (HOSPITAL_COMMUNITY)

## 2024-10-12 ENCOUNTER — Inpatient Hospital Stay: Attending: Hematology and Oncology | Admitting: Adult Health

## 2024-10-18 ENCOUNTER — Ambulatory Visit

## 2024-11-17 ENCOUNTER — Ambulatory Visit: Admitting: Nurse Practitioner

## 2025-01-04 ENCOUNTER — Ambulatory Visit: Admitting: Family Medicine

## 2025-08-08 ENCOUNTER — Encounter: Admitting: Family Medicine
